# Patient Record
Sex: Female | Born: 1935
Health system: Southern US, Community
[De-identification: ages and names within clinical notes are randomized; demographics above are authoritative.]

## PROBLEM LIST (undated history)

## (undated) DIAGNOSIS — R51 Headache: Secondary | ICD-10-CM

## (undated) DIAGNOSIS — J45909 Unspecified asthma, uncomplicated: Secondary | ICD-10-CM

## (undated) DIAGNOSIS — E785 Hyperlipidemia, unspecified: Secondary | ICD-10-CM

## (undated) DIAGNOSIS — I209 Angina pectoris, unspecified: Secondary | ICD-10-CM

## (undated) DIAGNOSIS — I509 Heart failure, unspecified: Secondary | ICD-10-CM

## (undated) DIAGNOSIS — I2510 Atherosclerotic heart disease of native coronary artery without angina pectoris: Secondary | ICD-10-CM

## (undated) DIAGNOSIS — G4700 Insomnia, unspecified: Secondary | ICD-10-CM

## (undated) DIAGNOSIS — M810 Age-related osteoporosis without current pathological fracture: Secondary | ICD-10-CM

## (undated) DIAGNOSIS — J449 Chronic obstructive pulmonary disease, unspecified: Secondary | ICD-10-CM

## (undated) DIAGNOSIS — IMO0001 Reserved for inherently not codable concepts without codable children: Secondary | ICD-10-CM

## (undated) DIAGNOSIS — R112 Nausea with vomiting, unspecified: Secondary | ICD-10-CM

## (undated) DIAGNOSIS — Z9889 Other specified postprocedural states: Secondary | ICD-10-CM

## (undated) DIAGNOSIS — F329 Major depressive disorder, single episode, unspecified: Secondary | ICD-10-CM

## (undated) DIAGNOSIS — J439 Emphysema, unspecified: Secondary | ICD-10-CM

## (undated) DIAGNOSIS — J189 Pneumonia, unspecified organism: Secondary | ICD-10-CM

## (undated) DIAGNOSIS — F32A Depression, unspecified: Secondary | ICD-10-CM

## (undated) DIAGNOSIS — M1990 Unspecified osteoarthritis, unspecified site: Secondary | ICD-10-CM

## (undated) DIAGNOSIS — I10 Essential (primary) hypertension: Secondary | ICD-10-CM

## (undated) DIAGNOSIS — Z8719 Personal history of other diseases of the digestive system: Secondary | ICD-10-CM

## (undated) DIAGNOSIS — K219 Gastro-esophageal reflux disease without esophagitis: Secondary | ICD-10-CM

## (undated) DIAGNOSIS — R519 Headache, unspecified: Secondary | ICD-10-CM

## (undated) DIAGNOSIS — Z87442 Personal history of urinary calculi: Secondary | ICD-10-CM

## (undated) HISTORY — PX: CHOLECYSTECTOMY: SHX55

## (undated) HISTORY — PX: FOOT SURGERY: SHX648

## (undated) HISTORY — PX: ABDOMINAL HYSTERECTOMY: SHX81

## (undated) HISTORY — PX: APPENDECTOMY: SHX54

## (undated) HISTORY — DX: Insomnia, unspecified: G47.00

## (undated) HISTORY — PX: CATARACT EXTRACTION W/ INTRAOCULAR LENS  IMPLANT, BILATERAL: SHX1307

## (undated) HISTORY — DX: Emphysema, unspecified: J43.9

## (undated) HISTORY — DX: Hyperlipidemia, unspecified: E78.5

## (undated) HISTORY — PX: CARDIAC CATHETERIZATION: SHX172

## (undated) HISTORY — DX: Atherosclerotic heart disease of native coronary artery without angina pectoris: I25.10

## (undated) HISTORY — PX: REPLACEMENT TOTAL KNEE BILATERAL: SUR1225

## (undated) HISTORY — PX: BREAST SURGERY: SHX581

---

## 2000-05-13 ENCOUNTER — Ambulatory Visit (HOSPITAL_COMMUNITY): Admission: RE | Admit: 2000-05-13 | Discharge: 2000-05-13 | Payer: Self-pay | Admitting: Gastroenterology

## 2000-05-13 ENCOUNTER — Encounter: Payer: Self-pay | Admitting: Gastroenterology

## 2001-07-01 ENCOUNTER — Encounter: Payer: Self-pay | Admitting: Orthopedic Surgery

## 2001-07-07 ENCOUNTER — Encounter: Payer: Self-pay | Admitting: Orthopedic Surgery

## 2001-07-07 ENCOUNTER — Inpatient Hospital Stay (HOSPITAL_COMMUNITY): Admission: RE | Admit: 2001-07-07 | Discharge: 2001-07-12 | Payer: Self-pay | Admitting: Orthopedic Surgery

## 2001-07-12 ENCOUNTER — Inpatient Hospital Stay (HOSPITAL_COMMUNITY)
Admission: AD | Admit: 2001-07-12 | Discharge: 2001-07-18 | Payer: Self-pay | Admitting: Physical Medicine & Rehabilitation

## 2005-11-26 ENCOUNTER — Inpatient Hospital Stay (HOSPITAL_COMMUNITY): Admission: RE | Admit: 2005-11-26 | Discharge: 2005-11-29 | Payer: Self-pay | Admitting: Orthopedic Surgery

## 2011-04-01 DIAGNOSIS — J189 Pneumonia, unspecified organism: Secondary | ICD-10-CM | POA: Insufficient documentation

## 2011-04-01 HISTORY — DX: Pneumonia, unspecified organism: J18.9

## 2013-04-22 HISTORY — PX: COLONOSCOPY: SHX174

## 2014-11-14 DIAGNOSIS — I2510 Atherosclerotic heart disease of native coronary artery without angina pectoris: Secondary | ICD-10-CM | POA: Insufficient documentation

## 2014-11-14 DIAGNOSIS — F17200 Nicotine dependence, unspecified, uncomplicated: Secondary | ICD-10-CM

## 2014-11-14 DIAGNOSIS — E782 Mixed hyperlipidemia: Secondary | ICD-10-CM

## 2014-11-14 DIAGNOSIS — F320 Major depressive disorder, single episode, mild: Secondary | ICD-10-CM | POA: Insufficient documentation

## 2014-11-14 HISTORY — DX: Atherosclerotic heart disease of native coronary artery without angina pectoris: I25.10

## 2014-11-14 HISTORY — DX: Mixed hyperlipidemia: E78.2

## 2014-11-14 HISTORY — DX: Major depressive disorder, single episode, mild: F32.0

## 2014-12-15 ENCOUNTER — Emergency Department (HOSPITAL_COMMUNITY): Payer: Medicare Other

## 2014-12-15 ENCOUNTER — Encounter (HOSPITAL_COMMUNITY): Payer: Self-pay

## 2014-12-15 ENCOUNTER — Emergency Department (HOSPITAL_COMMUNITY)
Admission: EM | Admit: 2014-12-15 | Discharge: 2014-12-15 | Disposition: A | Discharge status: Home / Self Care | Payer: Medicare Other | Attending: Emergency Medicine | Admitting: Emergency Medicine

## 2014-12-15 DIAGNOSIS — J449 Chronic obstructive pulmonary disease, unspecified: Secondary | ICD-10-CM | POA: Insufficient documentation

## 2014-12-15 DIAGNOSIS — Z880 Allergy status to penicillin: Secondary | ICD-10-CM | POA: Diagnosis not present

## 2014-12-15 DIAGNOSIS — R531 Weakness: Secondary | ICD-10-CM | POA: Diagnosis not present

## 2014-12-15 DIAGNOSIS — Z7982 Long term (current) use of aspirin: Secondary | ICD-10-CM | POA: Diagnosis not present

## 2014-12-15 DIAGNOSIS — M1990 Unspecified osteoarthritis, unspecified site: Secondary | ICD-10-CM | POA: Diagnosis not present

## 2014-12-15 DIAGNOSIS — R300 Dysuria: Secondary | ICD-10-CM | POA: Diagnosis present

## 2014-12-15 DIAGNOSIS — R109 Unspecified abdominal pain: Secondary | ICD-10-CM | POA: Diagnosis not present

## 2014-12-15 DIAGNOSIS — I509 Heart failure, unspecified: Secondary | ICD-10-CM | POA: Diagnosis not present

## 2014-12-15 DIAGNOSIS — R935 Abnormal findings on diagnostic imaging of other abdominal regions, including retroperitoneum: Secondary | ICD-10-CM | POA: Diagnosis not present

## 2014-12-15 DIAGNOSIS — Z7951 Long term (current) use of inhaled steroids: Secondary | ICD-10-CM | POA: Diagnosis not present

## 2014-12-15 DIAGNOSIS — Z79899 Other long term (current) drug therapy: Secondary | ICD-10-CM | POA: Insufficient documentation

## 2014-12-15 DIAGNOSIS — J0100 Acute maxillary sinusitis, unspecified: Secondary | ICD-10-CM | POA: Insufficient documentation

## 2014-12-15 HISTORY — DX: Unspecified osteoarthritis, unspecified site: M19.90

## 2014-12-15 HISTORY — DX: Unspecified asthma, uncomplicated: J45.909

## 2014-12-15 HISTORY — DX: Chronic obstructive pulmonary disease, unspecified: J44.9

## 2014-12-15 HISTORY — DX: Heart failure, unspecified: I50.9

## 2014-12-15 HISTORY — DX: Age-related osteoporosis without current pathological fracture: M81.0

## 2014-12-15 LAB — COMPREHENSIVE METABOLIC PANEL
ALK PHOS: 83 U/L (ref 38–126)
ALT: 17 U/L (ref 14–54)
ANION GAP: 5 (ref 5–15)
AST: 24 U/L (ref 15–41)
Albumin: 4 g/dL (ref 3.5–5.0)
BUN: 21 mg/dL — ABNORMAL HIGH (ref 6–20)
CALCIUM: 8.8 mg/dL — AB (ref 8.9–10.3)
CHLORIDE: 103 mmol/L (ref 101–111)
CO2: 31 mmol/L (ref 22–32)
Creatinine, Ser: 0.91 mg/dL (ref 0.44–1.00)
GFR, EST NON AFRICAN AMERICAN: 58 mL/min — AB (ref >60)
Glucose, Bld: 104 mg/dL — ABNORMAL HIGH (ref 65–99)
Potassium: 4.4 mmol/L (ref 3.5–5.1)
SODIUM: 139 mmol/L (ref 135–145)
Total Bilirubin: 0.4 mg/dL (ref 0.3–1.2)
Total Protein: 7.2 g/dL (ref 6.5–8.1)

## 2014-12-15 LAB — LIPASE, BLOOD: LIPASE: 38 U/L (ref 22–51)

## 2014-12-15 LAB — CBC WITH DIFFERENTIAL/PLATELET
Basophils Absolute: 0.1 10*3/uL (ref 0.0–0.1)
Basophils Relative: 1 %
EOS ABS: 0.3 10*3/uL (ref 0.0–0.7)
EOS PCT: 3 %
HCT: 41.2 % (ref 36.0–46.0)
Hemoglobin: 13.4 g/dL (ref 12.0–15.0)
LYMPHS ABS: 3.1 10*3/uL (ref 0.7–4.0)
Lymphocytes Relative: 36 %
MCH: 30.5 pg (ref 26.0–34.0)
MCHC: 32.5 g/dL (ref 30.0–36.0)
MCV: 93.6 fL (ref 78.0–100.0)
MONOS PCT: 10 %
Monocytes Absolute: 0.9 10*3/uL (ref 0.1–1.0)
Neutro Abs: 4.4 10*3/uL (ref 1.7–7.7)
Neutrophils Relative %: 50 %
PLATELETS: 194 10*3/uL (ref 150–400)
RBC: 4.4 MIL/uL (ref 3.87–5.11)
RDW: 13.3 % (ref 11.5–15.5)
WBC: 8.7 10*3/uL (ref 4.0–10.5)

## 2014-12-15 LAB — URINALYSIS, ROUTINE W REFLEX MICROSCOPIC
Bilirubin Urine: NEGATIVE
Glucose, UA: NEGATIVE mg/dL
Hgb urine dipstick: NEGATIVE
Ketones, ur: NEGATIVE mg/dL
Leukocytes, UA: NEGATIVE
Nitrite: NEGATIVE
Protein, ur: NEGATIVE mg/dL
Specific Gravity, Urine: 1.013 (ref 1.005–1.030)
Urobilinogen, UA: 0.2 mg/dL (ref 0.0–1.0)
pH: 6 (ref 5.0–8.0)

## 2014-12-15 LAB — TROPONIN I: Troponin I: <0.03 ng/mL (ref <0.031)

## 2014-12-15 LAB — I-STAT CG4 LACTIC ACID, ED
LACTIC ACID, VENOUS: 1.15 mmol/L (ref 0.5–2.0)
Lactic Acid, Venous: 1.39 mmol/L (ref 0.5–2.0)

## 2014-12-15 LAB — TSH: TSH: 1.182 u[IU]/mL (ref 0.350–4.500)

## 2014-12-15 LAB — MAGNESIUM: MAGNESIUM: 2 mg/dL (ref 1.7–2.4)

## 2014-12-15 MED ORDER — IOHEXOL 300 MG/ML  SOLN
100.0000 mL | Freq: Once | INTRAMUSCULAR | Status: AC | PRN
  Administered 2014-12-15: 100 mL via INTRAVENOUS

## 2014-12-15 MED ORDER — IOHEXOL 300 MG/ML  SOLN
50.0000 mL | Freq: Once | INTRAMUSCULAR | Status: AC | PRN
  Administered 2014-12-15: 50 mL via ORAL

## 2014-12-15 MED ORDER — SODIUM CHLORIDE 0.9 % IV SOLN
Freq: Once | INTRAVENOUS | Status: AC
  Administered 2014-12-15: 16:00:00 via INTRAVENOUS

## 2014-12-15 MED ORDER — AZITHROMYCIN 250 MG PO TABS: 250.0000 mg | ORAL_TABLET | Freq: Every day | ORAL | Status: DC

## 2014-12-15 MED ORDER — ONDANSETRON HCL 4 MG/2ML IJ SOLN
4.0000 mg | Freq: Once | INTRAMUSCULAR | Status: AC
  Administered 2014-12-15: 4 mg via INTRAVENOUS
  Filled 2014-12-15: qty 2

## 2014-12-15 MED ORDER — AZITHROMYCIN 250 MG PO TABS
500.0000 mg | ORAL_TABLET | Freq: Once | ORAL | Status: AC
  Administered 2014-12-15: 500 mg via ORAL
  Filled 2014-12-15: qty 2

## 2014-12-15 NOTE — ED Notes (Addendum)
Istat hcg test done in error, notified edp Miller.   Filled out edit sheet to credit patient's acct

## 2014-12-15 NOTE — ED Notes (Signed)
Per EMS- Patient lives at home. Patient c/o pain all over, but mainly right flank pain. Patient also c/o dysuria and urinary frequency. Patient wears O2 at night.

## 2014-12-15 NOTE — Discharge Instructions (Signed)

## 2014-12-15 NOTE — ED Notes (Signed)
Bed: WA07 Expected date:  Expected time:  Means of arrival:  Comments: ems 

## 2014-12-15 NOTE — ED Provider Notes (Signed)
CSN: [id number]     Arrival date & time 12/15/14  1436 History   First MD Initiated Contact with Patient 12/15/14 1456     Chief Complaint  Patient presents with  . Flank Pain  . Dysuria     (Consider location/radiation/quality/duration/timing/severity/associated sxs/prior Treatment) HPI Comments: 81 y/o female has had approximately 1 week of generalized weakness including feeling near syncopal starting last Friday - she reports staying in bed Sat and SUn b/c of weakness (generalized) and then this last week has done more with walking - she has been eating and drinking but states that she has no appetitis and is still weak.  She has dysuria, and hurts in her chest and abdomen from teh pelvis where there is pressure to the chest which burns.  She has ongoing light headedness.  Occasional cough, no fevers, no meds pta - she has beentaking her "fluid pills" and urinating frequently.  Patient is a 81 y.o. female presenting with flank pain and dysuria. The history is provided by the patient and a relative.  Flank Pain  Dysuria Associated symptoms: flank pain     Past Medical History  Diagnosis Date  . COPD (chronic obstructive pulmonary disease)   . CHF (congestive heart failure)   . Arthritis   . Asthma   . Osteoporosis    Past Surgical History  Procedure Laterality Date  . Replacement total knee bilateral    . Foot surgery    . Appendectomy    . Abdominal hysterectomy    . Cholecystectomy     Family History  Problem Relation Age of Onset  . Family history unknown: Yes   Social History  Substance Use Topics  . Smoking status: Never Smoker   . Smokeless tobacco: Never Used  . Alcohol Use: No   OB History    No data available     Review of Systems  Genitourinary: Positive for dysuria and flank pain.  All other systems reviewed and are negative.     Allergies  Codeine; Penicillins; and Tape  Home Medications   Prior to Admission medications   Medication Sig  Start Date End Date Taking? Authorizing Provider  albuterol (PROVENTIL HFA;VENTOLIN HFA) 108 (90 BASE) MCG/ACT inhaler Inhale 2 puffs into the lungs every 6 (six) hours as needed.   Yes Historical Provider, MD  aspirin 81 MG tablet Take 81 mg by mouth daily.   Yes Historical Provider, MD  budesonide-formoterol (SYMBICORT) 160-4.5 MCG/ACT inhaler Inhale 2 puffs into the lungs 2 (two) times daily.   Yes Historical Provider, MD  citalopram (CELEXA) 20 MG tablet Take 30 mg by mouth daily. 11/12/14  Yes Historical Provider, MD  clonazePAM (KLONOPIN) 1 MG tablet Take 1 mg by mouth daily. 11/20/14  Yes Historical Provider, MD  furosemide (LASIX) 20 MG tablet Take 20 mg by mouth daily. 11/20/14  Yes Historical Provider, MD  metoprolol succinate (TOPROL-XL) 25 MG 24 hr tablet Take 25 mg by mouth daily. 11/20/14  Yes Historical Provider, MD  pantoprazole (PROTONIX) 40 MG tablet Take 40 mg by mouth 2 (two) times daily. 11/20/14  Yes Historical Provider, MD  potassium chloride (MICRO-K) 10 MEQ CR capsule Take 10 mEq by mouth daily. 11/20/14  Yes Historical Provider, MD  Vitamin D, Ergocalciferol, (DRISDOL) 50000 UNITS CAPS capsule Take 50,000 Units by mouth every 7 (seven) days.   Yes Historical Provider, MD  azithromycin (ZITHROMAX Z-PAK) 250 MG tablet Take 1 tablet (250 mg total) by mouth daily. 500mg  PO day 1, then 250mg   PO days 205 12/15/14   Noemi Chapel, MD   BP 145/60 mmHg  Pulse 50  Temp(Src) 97.9 F (36.6 C) (Oral)  Resp 15  SpO2 95% Physical Exam  Constitutional: She appears well-developed and well-nourished. No distress.  HENT:  Head: Normocephalic and atraumatic.  Mouth/Throat: No oropharyngeal exudate.  Mucous membranes are slightly dehydrated  Eyes: Conjunctivae and EOM are normal. Pupils are equal, round, and reactive to light. Right eye exhibits no discharge. Left eye exhibits no discharge. No scleral icterus.  Neck: Normal range of motion. Neck supple. No JVD present. No thyromegaly present.   Cardiovascular: Normal rate, regular rhythm, normal heart sounds and intact distal pulses.  Exam reveals no gallop and no friction rub.   No murmur heard. Pulmonary/Chest: Effort normal and breath sounds normal. No respiratory distress. She has no wheezes. She has no rales.  Abdominal: Soft. Bowel sounds are normal. She exhibits no distension and no mass. There is tenderness (diffuse abdominal tenderness specifically lower abdomen, some in the epigastrium, obese, no tympanitic sounds to percussion).  Musculoskeletal: Normal range of motion. She exhibits edema (minimal bilateral edema, left leg slightly more swollen than the right, this is chronic). She exhibits no tenderness.  Lymphadenopathy:    She has no cervical adenopathy.  Neurological: She is alert. Coordination normal.  Able to sit up in the bed on her own ability, follows commands without difficulty, no focal weakness, normal speech, cranial nerves III through XII are normal  Skin: Skin is warm and dry. No rash noted. No erythema.  No rashes or skin breakdown  Psychiatric: She has a normal mood and affect. Her behavior is normal.  Nursing note and vitals reviewed.   ED Course  Procedures (including critical care time) Labs Review Labs Reviewed  COMPREHENSIVE METABOLIC PANEL - Abnormal; Notable for the following:    Glucose, Bld 104 (*)    BUN 21 (*)    Calcium 8.8 (*)    GFR calc non Af Amer 58 (*)    All other components within normal limits  I-STAT BETA HCG BLOOD, ED ([facility], [facility], [facility] ONLY) - Abnormal; Notable for the following:    I-stat hCG, quantitative 13.7 (*)    All other components within normal limits  URINE CULTURE  URINALYSIS, ROUTINE W REFLEX MICROSCOPIC (NOT AT [facility])  CBC WITH DIFFERENTIAL/PLATELET  LIPASE, BLOOD  MAGNESIUM  TROPONIN I  TSH  I-STAT CG4 LACTIC ACID, ED  I-STAT CG4 LACTIC ACID, ED    Imaging Review Ct Abdomen Pelvis W Contrast  12/15/2014   CLINICAL DATA:  Pain all over, mainly right flank  pain. Dysuria and urinary frequency. Wears oxygen at night. History of CHF and COPD.  EXAM: CT ABDOMEN AND PELVIS WITH CONTRAST  TECHNIQUE: Multidetector CT imaging of the abdomen and pelvis was performed using the standard protocol following bolus administration of intravenous contrast.  CONTRAST:  72mL OMNIPAQUE IOHEXOL 300 MG/ML SOLN, 187mL OMNIPAQUE IOHEXOL 300 MG/ML SOLN  COMPARISON:  02/03/2011  FINDINGS: Lower chest: No pulmonary nodules, pleural effusions, or infiltrates. Coronary artery calcifications are noted.  Upper abdomen: Small calcifications identified within the liver. No solid noncalcified mass is identified within the liver, spleen, pancreas, or right adrenal gland. The kidneys show symmetric bilateral excretion. There are bilateral small renal cysts, measuring less than 1 cm in diameter. A left adrenal nodule is indeterminate for washout. However the appearance is stable compared with previous exam in findings favor a benign adenoma. Gallbladder is absent.  Gastrointestinal tract: The appendix is  distended, measuring 18 mm consistent. There is very little if any inflammatory change surrounding the appendix. The density of the distended appendix is low-attenuation, raising the question of mucocele. There is no mural calcification within the appendix.  Small hiatal hernia identified. Otherwise the stomach has a normal appearance. Small bowel loops are normal in appearance. Colonic loops contain numerous diverticula but there is no evidence for acute diverticulitis.  Pelvis: Uterus is surgically absent. No adnexal mass. No free pelvic fluid.  Retroperitoneum: There is atherosclerotic calcification of the abdominal aorta. No retroperitoneal or mesenteric adenopathy.  Abdominal wall: Right inguinal fat containing hernia.  Osseous structures: Moderate degenerative changes in the lumbar spine. No suspicious lytic or blastic lesions are identified.  IMPRESSION: 1. Distended, low-attenuation appendix.  Findings raise a question of mucocele, but there is overlap with the CT appearance for acute appendicitis. 2. No associated abscess or free intraperitoneal air. 3. No urinary tract obstruction. 4. Colonic diverticulosis. 5. Coronary artery disease. 6. Small hepatic granulomata. 7. Benign left adrenal adenoma. 8. Small hiatal hernia. 9. Abdominal aortic atherosclerosis. 10. Small right inguinal fat containing hernia.  The salient findings were discussed with BRIAN MILLER on 12/15/2014 at 5:44 pm.   Electronically Signed   By: Nolon Nations M.D.   On: 12/15/2014 17:44   I have personally reviewed and evaluated these images and lab results as part of my medical decision-making.   EKG Interpretation   Date/Time:  Friday December 15 2014 15:52:19 EDT Ventricular Rate:  53 PR Interval:  205 QRS Duration: 100 QT Interval:  456 QTC Calculation: 428 R Axis:   22 Text Interpretation:  Sinus rhythm Abnormal R-wave progression, early  transition Since last tracing rate slower Confirmed by MILLER  MD, BRIAN  [phone]) on 12/15/2014 4:52:23 PM     labs unremarkable, thyroid test ordered, this can be followed up outpatient. Vital signs remain normal, CT scan shows possible appendiceal mucocele however the patient no longer has an appendix. Discussed with general surgery, she will need to make an appointment with them on Monday. Dr. Clarise Cruz is in agreement and will see the patient in the office.  Thyroid studies were ordered and can be followed up on Monday at the patient's doctor's office  She now states that she is complaining of maxillary facial pain, upper gum pain, earaches, there is no findings of acute infections however this may reflect sinusitis, Zithromax ordered for outpatient.  MDM   Final diagnoses:  Weakness  Abnormal CT of the abdomen  Acute maxillary sinusitis, recurrence not specified    Vital signs are unremarkable, exam is rather unremarkable as well except for the abdominal  tenderness. Pursue sources of occult generalized weakness. We'll evaluate her heart, abdominal organs, infectious etiologies sought      Noemi Chapel, MD 12/15/14 [phone]

## 2014-12-17 LAB — URINE CULTURE

## 2014-12-18 LAB — I-STAT BETA HCG BLOOD, ED (MC, WL, AP ONLY): I-stat hCG, quantitative: 13.7 m[IU]/mL — ABNORMAL HIGH (ref <5)

## 2015-02-09 ENCOUNTER — Other Ambulatory Visit: Payer: Self-pay | Admitting: General Surgery

## 2015-02-09 MED ORDER — GENTAMICIN SULFATE 40 MG/ML IJ SOLN: 5.0000 mg/kg | INTRAVENOUS | Status: AC

## 2015-02-09 MED ORDER — DEXTROSE 5 % IV SOLN: 900.0000 mg | INTRAVENOUS | Status: AC

## 2015-02-12 ENCOUNTER — Encounter (HOSPITAL_COMMUNITY): Payer: Self-pay

## 2015-02-12 ENCOUNTER — Encounter (HOSPITAL_COMMUNITY)
Admission: RE | Admit: 2015-02-12 | Discharge: 2015-02-12 | Disposition: A | Discharge status: Home / Self Care | Payer: Medicare Other | Source: Ambulatory Visit | Attending: General Surgery | Admitting: General Surgery

## 2015-02-12 DIAGNOSIS — J45909 Unspecified asthma, uncomplicated: Secondary | ICD-10-CM | POA: Diagnosis not present

## 2015-02-12 DIAGNOSIS — Z96653 Presence of artificial knee joint, bilateral: Secondary | ICD-10-CM | POA: Diagnosis not present

## 2015-02-12 DIAGNOSIS — Z79899 Other long term (current) drug therapy: Secondary | ICD-10-CM | POA: Diagnosis not present

## 2015-02-12 DIAGNOSIS — I5022 Chronic systolic (congestive) heart failure: Secondary | ICD-10-CM | POA: Insufficient documentation

## 2015-02-12 DIAGNOSIS — K388 Other specified diseases of appendix: Secondary | ICD-10-CM | POA: Insufficient documentation

## 2015-02-12 DIAGNOSIS — I110 Hypertensive heart disease with heart failure: Secondary | ICD-10-CM | POA: Diagnosis not present

## 2015-02-12 DIAGNOSIS — Z01818 Encounter for other preprocedural examination: Secondary | ICD-10-CM | POA: Insufficient documentation

## 2015-02-12 DIAGNOSIS — J449 Chronic obstructive pulmonary disease, unspecified: Secondary | ICD-10-CM | POA: Diagnosis not present

## 2015-02-12 DIAGNOSIS — Z01812 Encounter for preprocedural laboratory examination: Secondary | ICD-10-CM | POA: Insufficient documentation

## 2015-02-12 DIAGNOSIS — K219 Gastro-esophageal reflux disease without esophagitis: Secondary | ICD-10-CM | POA: Insufficient documentation

## 2015-02-12 DIAGNOSIS — E785 Hyperlipidemia, unspecified: Secondary | ICD-10-CM | POA: Diagnosis not present

## 2015-02-12 DIAGNOSIS — F329 Major depressive disorder, single episode, unspecified: Secondary | ICD-10-CM | POA: Insufficient documentation

## 2015-02-12 DIAGNOSIS — Z7982 Long term (current) use of aspirin: Secondary | ICD-10-CM | POA: Insufficient documentation

## 2015-02-12 HISTORY — DX: Headache: R51

## 2015-02-12 HISTORY — DX: Other specified postprocedural states: Z98.890

## 2015-02-12 HISTORY — DX: Other specified postprocedural states: R11.2

## 2015-02-12 HISTORY — DX: Essential (primary) hypertension: I10

## 2015-02-12 HISTORY — DX: Headache, unspecified: R51.9

## 2015-02-12 HISTORY — DX: Pneumonia, unspecified organism: J18.9

## 2015-02-12 HISTORY — DX: Angina pectoris, unspecified: I20.9

## 2015-02-12 HISTORY — DX: Personal history of other diseases of the digestive system: Z87.19

## 2015-02-12 HISTORY — DX: Reserved for inherently not codable concepts without codable children: IMO0001

## 2015-02-12 HISTORY — DX: Major depressive disorder, single episode, unspecified: F32.9

## 2015-02-12 HISTORY — DX: Depression, unspecified: F32.A

## 2015-02-12 HISTORY — DX: Gastro-esophageal reflux disease without esophagitis: K21.9

## 2015-02-12 LAB — PROTIME-INR
INR: 1.09 (ref 0.00–1.49)
Prothrombin Time: 14.3 seconds (ref 11.6–15.2)

## 2015-02-12 LAB — URINALYSIS, ROUTINE W REFLEX MICROSCOPIC
GLUCOSE, UA: NEGATIVE mg/dL
HGB URINE DIPSTICK: NEGATIVE
Ketones, ur: NEGATIVE mg/dL
LEUKOCYTES UA: NEGATIVE
Nitrite: NEGATIVE
Protein, ur: NEGATIVE mg/dL
SPECIFIC GRAVITY, URINE: 1.033 — AB (ref 1.005–1.030)
UROBILINOGEN UA: 1 mg/dL (ref 0.0–1.0)
pH: 5.5 (ref 5.0–8.0)

## 2015-02-12 LAB — CBC WITH DIFFERENTIAL/PLATELET
Basophils Absolute: 0.1 10*3/uL (ref 0.0–0.1)
Basophils Relative: 1 %
EOS PCT: 4 %
Eosinophils Absolute: 0.2 10*3/uL (ref 0.0–0.7)
HEMATOCRIT: 41.3 % (ref 36.0–46.0)
Hemoglobin: 13.1 g/dL (ref 12.0–15.0)
LYMPHS ABS: 3 10*3/uL (ref 0.7–4.0)
LYMPHS PCT: 44 %
MCH: 30.1 pg (ref 26.0–34.0)
MCHC: 31.7 g/dL (ref 30.0–36.0)
MCV: 94.9 fL (ref 78.0–100.0)
MONO ABS: 0.6 10*3/uL (ref 0.1–1.0)
Monocytes Relative: 8 %
Neutro Abs: 2.9 10*3/uL (ref 1.7–7.7)
Neutrophils Relative %: 43 %
PLATELETS: 203 10*3/uL (ref 150–400)
RBC: 4.35 MIL/uL (ref 3.87–5.11)
RDW: 13.5 % (ref 11.5–15.5)
WBC: 6.8 10*3/uL (ref 4.0–10.5)

## 2015-02-12 LAB — BASIC METABOLIC PANEL
Anion gap: 8 (ref 5–15)
BUN: 22 mg/dL — AB (ref 6–20)
CHLORIDE: 102 mmol/L (ref 101–111)
CO2: 30 mmol/L (ref 22–32)
Calcium: 9.5 mg/dL (ref 8.9–10.3)
Creatinine, Ser: 0.79 mg/dL (ref 0.44–1.00)
GFR calc Af Amer: >60 mL/min (ref >60)
GFR calc non Af Amer: >60 mL/min (ref >60)
GLUCOSE: 92 mg/dL (ref 65–99)
POTASSIUM: 4.1 mmol/L (ref 3.5–5.1)
Sodium: 140 mmol/L (ref 135–145)

## 2015-02-12 NOTE — Progress Notes (Addendum)
PCP: Dr. Bea Gra[facility] in [place] [phone]  Pulmonary: Dr. Adriana Reams [facility], will request notes,any studies  Cardiologist: Dr. [name] [facility] physicians([facility]) Patricia Pesa- will request:note,stress test, ekg,echo, clearance note  Request: heart cath:[facility], echo [facility].

## 2015-02-12 NOTE — Pre-Procedure Instructions (Signed)
    Danielle [name]  02/12/2015      [facility] [address] [place], [place] - [address] [address] [address] [place] [address] Phone: [phone] Fax: [phone]    Your procedure is scheduled on Monday, Nov.21  Report to [facility] Admitting at 9:00 A.M.  Call this number if you have problems the morning of surgery:  [phone]              Any questions prior to surgery call [phone] Monday-Friday 8AM-4PM   Remember:  Do not eat food or drink liquids after midnight on Sunday, Nov. 20                   Take these medicines the morning of surgery with A SIP OF WATER : albuterol if needed (bring to hospital), symbicort-bring to hospital, citalopram (celexa), clonazepam (klonopin), metoprolol (toprol-xl),pantoprazole (protonix)              Stop aspirin,vitamins, herbal medicines,and don't take nsaids medicines: advil, motrin, ibuprofen,aleve  5 days  prior to surgery per Dr. Barry Dienes   Do not wear jewelry, make-up or nail polish.  Do not wear lotions, powders, or perfumes.  You may not wear deodorant.  Do not shave 48 hours prior to surgery.  Men may shave face and neck.  Do not bring valuables to the hospital.  [facility] is not responsible for any belongings or valuables.  Contacts, dentures or bridgework may not be worn into surgery.  Leave your suitcase in the car.  After surgery it may be brought to your room.  For patients admitted to the hospital, discharge time will be determined by your treatment team.  Patients discharged the day of surgery will not be allowed to drive home.   Name and phone number of your driver:   Special instructions: review preparing for surgery  Please read over the following fact sheets that you were given. Pain Booklet, Coughing and Deep Breathing and Surgical Site Infection Prevention

## 2015-02-12 NOTE — Progress Notes (Addendum)
Anesthesia Chart Review: Patient is a 81 year old female scheduled for appendectomy, laparoscopic possible ileocolectomy on 02/19/15 by Dr. Barry Dienes. DX: Mucocele of the appendix.  Other history includes chronic diastolic CHF, smoking, depression, HLD, COPD with nocturnal home O2, HTN, post-operative N/V, arthritis, asthma, exertional dyspnea, hiatal hernia, GERD, headaches, osteoporosis, hysterectomy, bilateral BKA, cholecystectomy. She was seen by cardiology ~ 11/2014 for chest pain and had a stress test. BMI is consistent with morbid obesity.   PCP is Dr. Gilford Rile. She sees Karie Chimera, PA-C with [employer], records requested.  Cardiologist is Dr. [name]. 01/24/15 notes (Care Everywhere) indicates that "she is ok to proceed with Surgery."  Meds include albuterol, ASA 81mg , Symbicort, Klonipin, Lasix, Toprol XL, Protonix, Micro-K, Crestor, Drisdol.  12/15/14 EKG: SR, abnormal r wave progression, early transition.   01/16/15 Dobutamine Cardiolite [facility]): PENDING   Reported a prior cardiac cath at [facility] ~ 2014. Records PENDING.  Preoperative labs noted. A1C is pending.   Will follow-up once [facility] and [facility] records received. Also awaiting pulmonology records.   George Hugh [facility]/Anesthesiology Phone [phone] 02/12/2015 5:05 PM  Addendum: A1C 6.7.   01/16/15 Nuclear stress test [facility]): Conclusions:  1. Functional capacity is not assessed. 2. Normal resting LV size/function with LVEF of 71%. 3. No clinical or scintigraphic ischemia at target HR on dobutamine.  4. Negative Dobutamine perfusion stress test.  12/14/12 [facility] ([facility]; Dr. Lewayne Bunting): Mild non-obstructive disease, with ectatic RCA with maximal stenosis of 40%. Other vessels are normal. Normal Lv systolic function, LVEF [id number]. Recommend work-up for non-cardiac causes of symptoms. Medical therapy for CAD risk factor reduction.    No echo within five years from [facility] or [facility]. Only copy of clearance note received from [facility]. Pulmonology records are still pending. I've asked nursing staff to have me review once available.   George Hugh [facility] Short Stay Center/Anesthesiology Phone [phone] 02/14/2015 1:19 PM

## 2015-02-13 LAB — HEMOGLOBIN A1C
HEMOGLOBIN A1C: 6.7 % — AB (ref 4.8–5.6)
MEAN PLASMA GLUCOSE: 146 mg/dL

## 2015-02-14 ENCOUNTER — Encounter (HOSPITAL_COMMUNITY): Payer: Self-pay

## 2015-02-15 NOTE — H&P (Signed)
[facility] Location: [facility] Patient #: [id number] DOB: [birth year] Married / Language: English / Race: White Female   History of Present Illness The patient is a 81 year old female who presents with an abdominal mass. Patient is a 81 year old female who is referred by the emergency department for a mucocele of the appendix found on a CT scan. The patient went to the emergency department because of abdominal pain. She states she has been having some pain in the right abdomen for around a year or so, but this was much worse. She denied any significant nausea or vomiting. She stated that that migrated into radiate to the middle of her abdomen and up to her flank. She does not have any fevers or chills. Her workup in the emergency department did not reveal any leukocytosis. She was concerned because she has had an appendectomy at age 43 and was unclear how she could have an appendiceal mass. The patient has significant orthopedic issues with back pain and status post bilateral knee replacements. Of note, she had a colonoscopy last year by Dr. Lyndel Safe in [place]. She has a brother had colon cancer and a sister with pancreatic cancer. She denies any breast cancer or ovarian cancer in her family. She does have a cardiologist Dr. Orlinda Blalock and [place]. She states that she has had possible negative stress tests and cardiology workups. She does use oxygen at night. CT abd/pelvis 12/15/2014 IMPRESSION: 1. Distended, low-attenuation appendix. Findings raise a question of mucocele, but there is overlap with the CT appearance for acute appendicitis. 2. No associated abscess or free intraperitoneal air. 3. No urinary tract obstruction. 4. Colonic diverticulosis. 5. Coronary artery disease. 6. Small hepatic granulomata. 7. Benign left adrenal adenoma. 8. Small hiatal hernia. 9. Abdominal aortic atherosclerosis. 10. Small right inguinal fat containing hernia.   Other  Problems Anxiety Disorder Arthritis Asthma Back Pain Bladder Problems Chest pain Cholelithiasis Chronic Obstructive Lung Disease Congestive Heart Failure Depression Gastroesophageal Reflux Disease Hemorrhoids High blood pressure Home Oxygen Use Hypercholesterolemia Inguinal Hernia Kidney Stone Lump In Breast  Past Surgical History Appendectomy Breast Biopsy Left. Cataract Surgery Bilateral. Colon Polyp Removal - Colonoscopy Foot Surgery Left. Gallbladder Surgery - Open Hemorrhoidectomy Hysterectomy (not due to cancer) - Partial Knee Surgery Bilateral. Oral Surgery  Diagnostic Studies History  Colonoscopy 1-5 years ago Mammogram within last year  Allergies  No Known Drug Allergies  Medication History Albuterol Sulfate HFA (108MCG/ACT Aerosol Soln, Inhalation) Active. Aspirin (81MG  Tablet, Oral) Active. Citalopram Hydrobromide (20MG  Tablet, Oral) Active. Crestor (40MG  Tablet, Oral) Active. ClonazePAM (1MG  Tablet Disperse, Oral) Active. Furosemide (20MG  Tablet, Oral) Active. Metoprolol Tartrate (25MG  Tablet, Oral) Active. Pantoprazole Sodium (40MG  Tablet DR, Oral) Active. Potassium Chloride (10MEQ Capsule ER, Oral) Active. Symbicort (160-4.5MCG/ACT Aerosol, Inhalation) Active. Vitamin D (1000UNIT Tablet, Oral) Active. Medications Reconciled  Social History Caffeine use Carbonated beverages, Coffee, Tea. No alcohol use No drug use Tobacco use Current some day smoker.  Family History  Arthritis Brother, Father, Sister, Son. Cerebrovascular Accident Father. Colon Cancer Brother. Depression Sister. Diabetes Mellitus Brother, Son. Heart Disease Mother, Son. Malignant Neoplasm Of Pancreas Sister. Respiratory Condition Father, Sister. Thyroid problems Sister.  Pregnancy / Birth History Age at menarche 89 years. Gravida 2 Maternal age 73-20 Para 2    Review of Systems General Present-  Appetite Loss, Chills, Fatigue, Weight Gain and Weight Loss. Not Present- Fever and Night Sweats. Skin Present- Dryness. Not Present- Change in Wart/Mole, Hives, Jaundice, New Lesions, Non-Healing Wounds, Rash and Ulcer. HEENT Present-  Earache, Hoarseness, Seasonal Allergies, Sinus Pain and Wears glasses/contact lenses. Not Present- Hearing Loss, Nose Bleed, Oral Ulcers, Ringing in the Ears, Sore Throat, Visual Disturbances and Yellow Eyes. Respiratory Present- Chronic Cough, Difficulty Breathing, Snoring and Wheezing. Not Present- Bloody sputum. Breast Present- Breast Pain. Not Present- Breast Mass, Nipple Discharge and Skin Changes. Cardiovascular Present- Chest Pain, Leg Cramps, Shortness of Breath and Swelling of Extremities. Not Present- Difficulty Breathing Lying Down, Palpitations and Rapid Heart Rate. Gastrointestinal Present- Abdominal Pain, Bloating, Excessive gas, Hemorrhoids, Indigestion and Nausea. Not Present- Bloody Stool, Change in Bowel Habits, Chronic diarrhea, Constipation, Difficulty Swallowing, Gets full quickly at meals, Rectal Pain and Vomiting. Female Genitourinary Present- Frequency, Nocturia and Pelvic Pain. Not Present- Painful Urination and Urgency. Musculoskeletal Present- Back Pain, Joint Pain, Muscle Pain, Muscle Weakness and Swelling of Extremities. Not Present- Joint Stiffness. Neurological Present- Decreased Memory, Headaches, Numbness, Tingling, Tremor, Trouble walking and Weakness. Not Present- Fainting and Seizures. Psychiatric Present- Anxiety, Depression and Fearful. Not Present- Bipolar, Change in Sleep Pattern and Frequent crying. Endocrine Present- Heat Intolerance. Not Present- Cold Intolerance, Excessive Hunger, Hair Changes, Hot flashes and New Diabetes. Hematology Present- Easy Bruising. Not Present- Excessive bleeding, Gland problems, HIV and Persistent Infections.  Vitals  Weight: 250 lb Height: 63in Body Surface Area: 2.25 m Body Mass Index:  44.29 kg/m  Temp.: 98.75F(Temporal)  Pulse: 60 (Regular)  BP: 130/72 (Sitting, Left Arm, Standard)     Physical Exam  General Mental Status-Alert. General Appearance-Consistent with stated age. Hydration-Well hydrated. Voice-Normal. Note: Pt uses cane to get around.   Head and Neck Head-normocephalic, atraumatic with no lesions or palpable masses. Trachea-midline. Thyroid Gland Characteristics - normal size and consistency.  Eye Eyeball - Bilateral-Extraocular movements intact. Sclera/Conjunctiva - Bilateral-No scleral icterus.  Chest and Lung Exam Chest and lung exam reveals -quiet, even and easy respiratory effort with no use of accessory muscles and on auscultation, normal breath sounds, no adventitious sounds and normal vocal resonance. Inspection Chest Wall - Normal. Back - normal.  Cardiovascular Cardiovascular examination reveals -normal heart sounds, regular rate and rhythm with no murmurs and normal pedal pulses bilaterally.  Abdomen Inspection Inspection of the abdomen reveals - No Hernias. Palpation/Percussion Palpation and Percussion of the abdomen reveal - Soft, Non Tender, No Rebound tenderness, No Rigidity (guarding) and No hepatosplenomegaly. Auscultation Auscultation of the abdomen reveals - Bowel sounds normal. Note: striae on abdomen. Far lateral right abdominal scar from appendicitis. Small midline scar from BTL. RUQ scar from open chole   Neurologic Neurologic evaluation reveals -alert and oriented x 3 with no impairment of recent or remote memory. Mental Status-Normal.  Musculoskeletal Global Assessment -Note: no gross deformities.  Normal Exam - Left-Upper Extremity Strength Normal and Lower Extremity Strength Normal. Normal Exam - Right-Upper Extremity Strength Normal and Lower Extremity Strength Normal.  Lymphatic Head & Neck  General Head & Neck Lymphatics: Bilateral - Description -  Normal. Axillary  General Axillary Region: Bilateral - Description - Normal. Tenderness - Non Tender. Femoral & Inguinal  Generalized Femoral & Inguinal Lymphatics: Bilateral - Description - No Generalized lymphadenopathy.    Assessment & Plan MUCOCELE, APPENDIX (K38.8) Impression: This is likely a mucocele at the stump of the appendix. However, she may certainly have this from the colon or the small bowel. I discussed doing a appendectomy of the appendiceal stump versus partial cecectomy. I also reviewed that sometimes we have to completely remove that portion of the colon and distal small intestine and reconnect things. I reviewed the risks of  all of these procedures. Did discuss that she would be at slightly increased risk based on her lungs.  I discussed the risk of bleeding, infection, damage to adjacent structures, possible finding of cancer, possible need for additional treatment, possible recurrent cancer, heart or lung complications, hernia, and death.  The patient understands and wishes to proceed.  45 min spent in examination, evaluation, counseling, and coordination of care. >50% spent in counseling. Current Plans You are being scheduled for surgery - Our schedulers will call you.  You should hear from our office's scheduling department within 5 working days about the location, date, and time of surgery. We try to make accommodations for patient's preferences in scheduling surgery, but sometimes the OR schedule or the surgeon's schedule prevents [place] from making those accommodations.  If you have not heard from our office [phone] in 5 working days, call the office and ask for your surgeon's nurse.  If you have other questions about your diagnosis, plan, or surgery, call the office and ask for your surgeon's nurse.  Pt Education - [facility] Free Text Education/Instructions: discussed with patient and provided information. Pt Education - AT Colonoscopy Bowel Prep: discussed with  patient and provided information. Started Neomycin Sulfate 500MG , 2 (two) Tablet SEE NOTE, #6, 01/01/2015, No Refill. Local Order: TAKE TWO TABLETS AT 2 PM, 3 PM, AND 10 PM THE DAY PRIOR TO SURGERY Started Flagyl 500MG , 2 (two) Tablet SEE NOTE, #6, 01/01/2015, No Refill. Local Order: Take at 2pm, 3pm, and 10pm the day prior to your colon operation

## 2015-02-15 NOTE — Progress Notes (Signed)
Called [phone] request was sent for PFT, Ct, sleep study. Spoke with the office staff and they will fax over PFT and CT report. Staff states no sleep study noted.

## 2015-02-19 ENCOUNTER — Ambulatory Visit (HOSPITAL_COMMUNITY)
Admission: RE | Admit: 2015-02-19 | Discharge: 2015-02-22 | Disposition: A | Discharge status: Home / Self Care | Payer: Medicare Other | Source: Ambulatory Visit | Attending: General Surgery | Admitting: General Surgery

## 2015-02-19 ENCOUNTER — Encounter (HOSPITAL_COMMUNITY): Payer: Self-pay | Admitting: *Deleted

## 2015-02-19 ENCOUNTER — Encounter (HOSPITAL_COMMUNITY)
Admission: RE | Disposition: A | Discharge status: Home / Self Care | Payer: Self-pay | Source: Ambulatory Visit | Attending: General Surgery

## 2015-02-19 ENCOUNTER — Inpatient Hospital Stay (HOSPITAL_COMMUNITY): Payer: Medicare Other | Admitting: Vascular Surgery

## 2015-02-19 ENCOUNTER — Inpatient Hospital Stay (HOSPITAL_COMMUNITY): Payer: Medicare Other | Admitting: Anesthesiology

## 2015-02-19 DIAGNOSIS — F17210 Nicotine dependence, cigarettes, uncomplicated: Secondary | ICD-10-CM | POA: Diagnosis not present

## 2015-02-19 DIAGNOSIS — E7800 Pure hypercholesterolemia, unspecified: Secondary | ICD-10-CM | POA: Insufficient documentation

## 2015-02-19 DIAGNOSIS — K6389 Other specified diseases of intestine: Secondary | ICD-10-CM

## 2015-02-19 DIAGNOSIS — I509 Heart failure, unspecified: Secondary | ICD-10-CM | POA: Insufficient documentation

## 2015-02-19 DIAGNOSIS — I110 Hypertensive heart disease with heart failure: Secondary | ICD-10-CM | POA: Insufficient documentation

## 2015-02-19 DIAGNOSIS — F329 Major depressive disorder, single episode, unspecified: Secondary | ICD-10-CM | POA: Diagnosis not present

## 2015-02-19 DIAGNOSIS — K219 Gastro-esophageal reflux disease without esophagitis: Secondary | ICD-10-CM | POA: Diagnosis not present

## 2015-02-19 DIAGNOSIS — Z885 Allergy status to narcotic agent status: Secondary | ICD-10-CM | POA: Insufficient documentation

## 2015-02-19 DIAGNOSIS — I25119 Atherosclerotic heart disease of native coronary artery with unspecified angina pectoris: Secondary | ICD-10-CM | POA: Diagnosis not present

## 2015-02-19 DIAGNOSIS — Z9981 Dependence on supplemental oxygen: Secondary | ICD-10-CM | POA: Diagnosis not present

## 2015-02-19 DIAGNOSIS — Z23 Encounter for immunization: Secondary | ICD-10-CM | POA: Insufficient documentation

## 2015-02-19 DIAGNOSIS — F419 Anxiety disorder, unspecified: Secondary | ICD-10-CM | POA: Diagnosis not present

## 2015-02-19 DIAGNOSIS — K388 Other specified diseases of appendix: Secondary | ICD-10-CM | POA: Diagnosis not present

## 2015-02-19 DIAGNOSIS — J45909 Unspecified asthma, uncomplicated: Secondary | ICD-10-CM | POA: Insufficient documentation

## 2015-02-19 DIAGNOSIS — R109 Unspecified abdominal pain: Secondary | ICD-10-CM | POA: Diagnosis present

## 2015-02-19 DIAGNOSIS — J449 Chronic obstructive pulmonary disease, unspecified: Secondary | ICD-10-CM | POA: Insufficient documentation

## 2015-02-19 DIAGNOSIS — M7989 Other specified soft tissue disorders: Secondary | ICD-10-CM | POA: Insufficient documentation

## 2015-02-19 DIAGNOSIS — Z86010 Personal history of colonic polyps: Secondary | ICD-10-CM | POA: Insufficient documentation

## 2015-02-19 DIAGNOSIS — Z888 Allergy status to other drugs, medicaments and biological substances status: Secondary | ICD-10-CM | POA: Insufficient documentation

## 2015-02-19 DIAGNOSIS — Z6841 Body Mass Index (BMI) 40.0 and over, adult: Secondary | ICD-10-CM | POA: Diagnosis not present

## 2015-02-19 DIAGNOSIS — M1990 Unspecified osteoarthritis, unspecified site: Secondary | ICD-10-CM | POA: Insufficient documentation

## 2015-02-19 DIAGNOSIS — Z880 Allergy status to penicillin: Secondary | ICD-10-CM | POA: Diagnosis not present

## 2015-02-19 HISTORY — PX: LAPAROSCOPIC APPENDECTOMY: SHX408

## 2015-02-19 HISTORY — DX: Other specified diseases of intestine: K63.89

## 2015-02-19 LAB — CBC
HCT: 43.1 % (ref 36.0–46.0)
HEMOGLOBIN: 14.4 g/dL (ref 12.0–15.0)
MCH: 31.5 pg (ref 26.0–34.0)
MCHC: 33.4 g/dL (ref 30.0–36.0)
MCV: 94.3 fL (ref 78.0–100.0)
PLATELETS: 193 10*3/uL (ref 150–400)
RBC: 4.57 MIL/uL (ref 3.87–5.11)
RDW: 13.3 % (ref 11.5–15.5)
WBC: 14.7 10*3/uL — ABNORMAL HIGH (ref 4.0–10.5)

## 2015-02-19 LAB — CREATININE, SERUM
CREATININE: 0.94 mg/dL (ref 0.44–1.00)
GFR calc Af Amer: >60 mL/min (ref >60)
GFR calc non Af Amer: 56 mL/min — ABNORMAL LOW (ref >60)

## 2015-02-19 SURGERY — APPENDECTOMY, LAPAROSCOPIC
Anesthesia: General | Site: Abdomen

## 2015-02-19 MED ORDER — ACETAMINOPHEN 325 MG PO TABS: 325.0000 mg | ORAL_TABLET | ORAL | Status: DC | PRN

## 2015-02-19 MED ORDER — DEXTROSE 5 % IV SOLN
5.0000 mg/kg | INTRAVENOUS | Status: DC
  Filled 2015-02-19: qty 14

## 2015-02-19 MED ORDER — FENTANYL CITRATE (PF) 100 MCG/2ML IJ SOLN
12.5000 ug | INTRAMUSCULAR | Status: DC | PRN
  Administered 2015-02-19 – 2015-02-20 (×5): 25 ug via INTRAVENOUS
  Administered 2015-02-21 (×2): 12.5 ug via INTRAVENOUS
  Administered 2015-02-21: 25 ug via INTRAVENOUS
  Filled 2015-02-19 (×10): qty 2

## 2015-02-19 MED ORDER — SODIUM CHLORIDE 0.9 % IR SOLN
Status: DC | PRN
  Administered 2015-02-19: 1000 mL

## 2015-02-19 MED ORDER — PHENYLEPHRINE 40 MCG/ML (10ML) SYRINGE FOR IV PUSH (FOR BLOOD PRESSURE SUPPORT)
PREFILLED_SYRINGE | INTRAVENOUS | Status: AC
  Filled 2015-02-19: qty 10

## 2015-02-19 MED ORDER — GLYCOPYRROLATE 0.2 MG/ML IJ SOLN
INTRAMUSCULAR | Status: AC
  Filled 2015-02-19: qty 1

## 2015-02-19 MED ORDER — PROPOFOL 10 MG/ML IV BOLUS
INTRAVENOUS | Status: DC | PRN
  Administered 2015-02-19: 20 mg via INTRAVENOUS
  Administered 2015-02-19: 130 mg via INTRAVENOUS

## 2015-02-19 MED ORDER — ONDANSETRON HCL 4 MG/2ML IJ SOLN
4.0000 mg | Freq: Four times a day (QID) | INTRAMUSCULAR | Status: DC | PRN
  Administered 2015-02-20 (×3): 4 mg via INTRAVENOUS
  Filled 2015-02-19 (×4): qty 2

## 2015-02-19 MED ORDER — LIDOCAINE HCL 1 % IJ SOLN
INTRAMUSCULAR | Status: DC | PRN
  Administered 2015-02-19: 27 mL via INTRAMUSCULAR

## 2015-02-19 MED ORDER — CLINDAMYCIN PHOSPHATE 900 MG/50ML IV SOLN
900.0000 mg | Freq: Three times a day (TID) | INTRAVENOUS | Status: AC
  Administered 2015-02-19 – 2015-02-20 (×2): 900 mg via INTRAVENOUS
  Filled 2015-02-19 (×2): qty 50

## 2015-02-19 MED ORDER — LACTATED RINGERS IV SOLN
Freq: Once | INTRAVENOUS | Status: AC
  Administered 2015-02-19: 10:00:00 via INTRAVENOUS

## 2015-02-19 MED ORDER — ONDANSETRON HCL 4 MG/2ML IJ SOLN
INTRAMUSCULAR | Status: AC
  Filled 2015-02-19: qty 2

## 2015-02-19 MED ORDER — PROPOFOL 10 MG/ML IV BOLUS
INTRAVENOUS | Status: AC
  Filled 2015-02-19: qty 20

## 2015-02-19 MED ORDER — POTASSIUM CHLORIDE CRYS ER 10 MEQ PO TBCR
10.0000 meq | EXTENDED_RELEASE_TABLET | Freq: Every day | ORAL | Status: DC
  Administered 2015-02-20 – 2015-02-22 (×3): 10 meq via ORAL
  Filled 2015-02-19 (×3): qty 1

## 2015-02-19 MED ORDER — ROSUVASTATIN CALCIUM 40 MG PO TABS
40.0000 mg | ORAL_TABLET | Freq: Every day | ORAL | Status: DC
  Administered 2015-02-20 – 2015-02-21 (×2): 40 mg via ORAL
  Filled 2015-02-19 (×4): qty 1

## 2015-02-19 MED ORDER — FUROSEMIDE 40 MG PO TABS
40.0000 mg | ORAL_TABLET | Freq: Every day | ORAL | Status: DC
  Administered 2015-02-20 – 2015-02-22 (×3): 40 mg via ORAL
  Filled 2015-02-19 (×4): qty 1

## 2015-02-19 MED ORDER — CIPROFLOXACIN IN D5W 400 MG/200ML IV SOLN: 400.0000 mg | Freq: Two times a day (BID) | INTRAVENOUS | Status: DC

## 2015-02-19 MED ORDER — CHLORHEXIDINE GLUCONATE CLOTH 2 % EX PADS: 6.0000 | MEDICATED_PAD | Freq: Once | CUTANEOUS | Status: DC

## 2015-02-19 MED ORDER — ONDANSETRON HCL 4 MG/2ML IJ SOLN
INTRAMUSCULAR | Status: AC
  Administered 2015-02-19: 4 mg
  Filled 2015-02-19: qty 2

## 2015-02-19 MED ORDER — BUDESONIDE-FORMOTEROL FUMARATE 160-4.5 MCG/ACT IN AERO
2.0000 | INHALATION_SPRAY | Freq: Two times a day (BID) | RESPIRATORY_TRACT | Status: DC
  Administered 2015-02-19 – 2015-02-22 (×6): 2 via RESPIRATORY_TRACT
  Filled 2015-02-19: qty 6

## 2015-02-19 MED ORDER — GENTAMICIN SULFATE 40 MG/ML IJ SOLN
5.0000 mg/kg | INTRAVENOUS | Status: AC
  Administered 2015-02-19: 380 mg via INTRAVENOUS
  Filled 2015-02-19: qty 9.5

## 2015-02-19 MED ORDER — ALBUTEROL SULFATE (2.5 MG/3ML) 0.083% IN NEBU
2.5000 mg | INHALATION_SOLUTION | Freq: Four times a day (QID) | RESPIRATORY_TRACT | Status: DC | PRN
Start: 2015-02-19 — End: 2015-02-22

## 2015-02-19 MED ORDER — DIPHENHYDRAMINE HCL 50 MG/ML IJ SOLN
12.5000 mg | Freq: Four times a day (QID) | INTRAMUSCULAR | Status: DC | PRN
  Filled 2015-02-19: qty 1

## 2015-02-19 MED ORDER — GLYCOPYRROLATE 0.2 MG/ML IJ SOLN
INTRAMUSCULAR | Status: DC | PRN
  Administered 2015-02-19 (×2): 0.2 mg via INTRAVENOUS

## 2015-02-19 MED ORDER — SUGAMMADEX SODIUM 200 MG/2ML IV SOLN
INTRAVENOUS | Status: DC | PRN
  Administered 2015-02-19: 200 mg via INTRAVENOUS

## 2015-02-19 MED ORDER — CLINDAMYCIN PHOSPHATE 900 MG/50ML IV SOLN
900.0000 mg | INTRAVENOUS | Status: AC
  Administered 2015-02-19: 900 mg via INTRAVENOUS
  Filled 2015-02-19: qty 50

## 2015-02-19 MED ORDER — HEPARIN SODIUM (PORCINE) 5000 UNIT/ML IJ SOLN
5000.0000 [IU] | Freq: Three times a day (TID) | INTRAMUSCULAR | Status: DC
Start: 2015-02-20 — End: 2015-02-22
  Administered 2015-02-20 – 2015-02-22 (×8): 5000 [IU] via SUBCUTANEOUS
  Filled 2015-02-19 (×7): qty 1

## 2015-02-19 MED ORDER — EPHEDRINE SULFATE 50 MG/ML IJ SOLN
INTRAMUSCULAR | Status: DC | PRN
  Administered 2015-02-19 (×2): 10 mg via INTRAVENOUS

## 2015-02-19 MED ORDER — OXYCODONE HCL 5 MG/5ML PO SOLN: 5.0000 mg | Freq: Once | ORAL | Status: DC | PRN

## 2015-02-19 MED ORDER — NEOMYCIN SULFATE 500 MG PO TABS
1000.0000 mg | ORAL_TABLET | ORAL | Status: DC
  Filled 2015-02-19: qty 2

## 2015-02-19 MED ORDER — LIDOCAINE HCL (CARDIAC) 20 MG/ML IV SOLN
INTRAVENOUS | Status: DC | PRN
  Administered 2015-02-19: 40 mg via INTRAVENOUS

## 2015-02-19 MED ORDER — ACETAMINOPHEN 160 MG/5ML PO SOLN
325.0000 mg | ORAL | Status: DC | PRN
  Filled 2015-02-19: qty 20.3

## 2015-02-19 MED ORDER — SCOPOLAMINE 1 MG/3DAYS TD PT72
MEDICATED_PATCH | TRANSDERMAL | Status: AC
  Filled 2015-02-19: qty 1

## 2015-02-19 MED ORDER — ACETAMINOPHEN 325 MG PO TABS
650.0000 mg | ORAL_TABLET | Freq: Four times a day (QID) | ORAL | Status: DC | PRN
  Administered 2015-02-22: 650 mg via ORAL
  Filled 2015-02-19 (×3): qty 2

## 2015-02-19 MED ORDER — OXYCODONE HCL 5 MG PO TABS: 5.0000 mg | ORAL_TABLET | Freq: Once | ORAL | Status: DC | PRN

## 2015-02-19 MED ORDER — ASPIRIN EC 81 MG PO TBEC
81.0000 mg | DELAYED_RELEASE_TABLET | Freq: Every day | ORAL | Status: DC
  Administered 2015-02-20 – 2015-02-22 (×3): 81 mg via ORAL
  Filled 2015-02-19 (×3): qty 1

## 2015-02-19 MED ORDER — SUCCINYLCHOLINE CHLORIDE 20 MG/ML IJ SOLN
INTRAMUSCULAR | Status: AC
  Filled 2015-02-19: qty 1

## 2015-02-19 MED ORDER — METOPROLOL SUCCINATE ER 25 MG PO TB24
25.0000 mg | ORAL_TABLET | Freq: Every day | ORAL | Status: DC
  Administered 2015-02-20 – 2015-02-22 (×3): 25 mg via ORAL
  Filled 2015-02-19 (×3): qty 1

## 2015-02-19 MED ORDER — PANTOPRAZOLE SODIUM 40 MG PO TBEC
40.0000 mg | DELAYED_RELEASE_TABLET | Freq: Two times a day (BID) | ORAL | Status: DC
  Administered 2015-02-20 – 2015-02-22 (×6): 40 mg via ORAL
  Filled 2015-02-19 (×6): qty 1

## 2015-02-19 MED ORDER — BUPIVACAINE-EPINEPHRINE (PF) 0.25% -1:200000 IJ SOLN
INTRAMUSCULAR | Status: AC
  Filled 2015-02-19: qty 30

## 2015-02-19 MED ORDER — FUROSEMIDE 20 MG PO TABS
20.0000 mg | ORAL_TABLET | Freq: Every day | ORAL | Status: DC
  Administered 2015-02-19 – 2015-02-21 (×2): 20 mg via ORAL
  Filled 2015-02-19 (×3): qty 1

## 2015-02-19 MED ORDER — ALBUTEROL SULFATE HFA 108 (90 BASE) MCG/ACT IN AERS: 2.0000 | INHALATION_SPRAY | Freq: Four times a day (QID) | RESPIRATORY_TRACT | Status: DC | PRN

## 2015-02-19 MED ORDER — 0.9 % SODIUM CHLORIDE (POUR BTL) OPTIME
TOPICAL | Status: DC | PRN
  Administered 2015-02-19: 1000 mL

## 2015-02-19 MED ORDER — SIMETHICONE 80 MG PO CHEW
40.0000 mg | CHEWABLE_TABLET | Freq: Four times a day (QID) | ORAL | Status: DC | PRN
  Administered 2015-02-20: 40 mg via ORAL
  Filled 2015-02-19: qty 1

## 2015-02-19 MED ORDER — FUROSEMIDE 40 MG PO TABS: 40.0000 mg | ORAL_TABLET | ORAL | Status: DC

## 2015-02-19 MED ORDER — FENTANYL CITRATE (PF) 250 MCG/5ML IJ SOLN
INTRAMUSCULAR | Status: AC
  Filled 2015-02-19: qty 5

## 2015-02-19 MED ORDER — ERYTHROMYCIN BASE 250 MG PO TABS
1000.0000 mg | ORAL_TABLET | ORAL | Status: DC
  Filled 2015-02-19: qty 4

## 2015-02-19 MED ORDER — CITALOPRAM HYDROBROMIDE 20 MG PO TABS
30.0000 mg | ORAL_TABLET | Freq: Every day | ORAL | Status: DC
  Administered 2015-02-20 – 2015-02-22 (×3): 30 mg via ORAL
  Filled 2015-02-19 (×3): qty 2

## 2015-02-19 MED ORDER — FENTANYL CITRATE (PF) 100 MCG/2ML IJ SOLN
INTRAMUSCULAR | Status: DC | PRN
  Administered 2015-02-19: 50 ug via INTRAVENOUS
  Administered 2015-02-19: 100 ug via INTRAVENOUS

## 2015-02-19 MED ORDER — INFLUENZA VAC SPLIT QUAD 0.5 ML IM SUSY
0.5000 mL | PREFILLED_SYRINGE | INTRAMUSCULAR | Status: DC
  Filled 2015-02-19: qty 0.5

## 2015-02-19 MED ORDER — ALVIMOPAN 12 MG PO CAPS
12.0000 mg | ORAL_CAPSULE | Freq: Once | ORAL | Status: AC
  Administered 2015-02-19: 12 mg via ORAL
  Filled 2015-02-19: qty 1

## 2015-02-19 MED ORDER — FENTANYL CITRATE (PF) 100 MCG/2ML IJ SOLN
25.0000 ug | INTRAMUSCULAR | Status: DC | PRN
  Administered 2015-02-19: 25 ug via INTRAVENOUS
  Administered 2015-02-19: 50 ug via INTRAVENOUS

## 2015-02-19 MED ORDER — DEXAMETHASONE SODIUM PHOSPHATE 4 MG/ML IJ SOLN
INTRAMUSCULAR | Status: DC | PRN
  Administered 2015-02-19: 4 mg via INTRAVENOUS

## 2015-02-19 MED ORDER — FENTANYL CITRATE (PF) 100 MCG/2ML IJ SOLN
INTRAMUSCULAR | Status: AC
  Administered 2015-02-19: 25 ug via INTRAVENOUS
  Filled 2015-02-19: qty 2

## 2015-02-19 MED ORDER — SUGAMMADEX SODIUM 200 MG/2ML IV SOLN
INTRAVENOUS | Status: AC
  Filled 2015-02-19: qty 2

## 2015-02-19 MED ORDER — ROCURONIUM BROMIDE 100 MG/10ML IV SOLN
INTRAVENOUS | Status: DC | PRN
  Administered 2015-02-19: 10 mg via INTRAVENOUS
  Administered 2015-02-19: 40 mg via INTRAVENOUS

## 2015-02-19 MED ORDER — ONDANSETRON 4 MG PO TBDP: 4.0000 mg | ORAL_TABLET | Freq: Four times a day (QID) | ORAL | Status: DC | PRN

## 2015-02-19 MED ORDER — LACTATED RINGERS IV SOLN
INTRAVENOUS | Status: DC | PRN
  Administered 2015-02-19 (×2): via INTRAVENOUS

## 2015-02-19 MED ORDER — OXYCODONE HCL 5 MG PO TABS
5.0000 mg | ORAL_TABLET | ORAL | Status: DC | PRN
  Administered 2015-02-20 – 2015-02-22 (×8): 5 mg via ORAL
  Filled 2015-02-19 (×10): qty 1

## 2015-02-19 MED ORDER — DOCUSATE SODIUM 100 MG PO CAPS
100.0000 mg | ORAL_CAPSULE | Freq: Two times a day (BID) | ORAL | Status: DC
  Administered 2015-02-19 – 2015-02-21 (×4): 100 mg via ORAL
  Filled 2015-02-19 (×4): qty 1

## 2015-02-19 MED ORDER — SUCCINYLCHOLINE CHLORIDE 20 MG/ML IJ SOLN
INTRAMUSCULAR | Status: DC | PRN
  Administered 2015-02-19: 60 mg via INTRAVENOUS

## 2015-02-19 MED ORDER — ONDANSETRON HCL 4 MG/2ML IJ SOLN
INTRAMUSCULAR | Status: DC | PRN
  Administered 2015-02-19 (×2): 4 mg via INTRAVENOUS

## 2015-02-19 MED ORDER — PHENYLEPHRINE HCL 10 MG/ML IJ SOLN
10.0000 mg | INTRAVENOUS | Status: DC | PRN
  Administered 2015-02-19: 10 ug/min via INTRAVENOUS

## 2015-02-19 MED ORDER — KCL IN DEXTROSE-NACL 20-5-0.45 MEQ/L-%-% IV SOLN
INTRAVENOUS | Status: DC
Start: 2015-02-19 — End: 2015-02-22
  Administered 2015-02-19 – 2015-02-21 (×3): via INTRAVENOUS
  Filled 2015-02-19 (×3): qty 1000

## 2015-02-19 MED ORDER — LIDOCAINE HCL (PF) 1 % IJ SOLN
INTRAMUSCULAR | Status: AC
  Filled 2015-02-19: qty 30

## 2015-02-19 MED ORDER — DEXAMETHASONE SODIUM PHOSPHATE 4 MG/ML IJ SOLN
INTRAMUSCULAR | Status: AC
  Filled 2015-02-19: qty 1

## 2015-02-19 MED ORDER — ACETAMINOPHEN 650 MG RE SUPP: 650.0000 mg | Freq: Four times a day (QID) | RECTAL | Status: DC | PRN

## 2015-02-19 MED ORDER — CLONAZEPAM 1 MG PO TABS
1.0000 mg | ORAL_TABLET | Freq: Every day | ORAL | Status: DC
  Administered 2015-02-20 – 2015-02-22 (×3): 1 mg via ORAL
  Filled 2015-02-19 (×3): qty 1

## 2015-02-19 MED ORDER — DIPHENHYDRAMINE HCL 12.5 MG/5ML PO ELIX: 12.5000 mg | ORAL_SOLUTION | Freq: Four times a day (QID) | ORAL | Status: DC | PRN

## 2015-02-19 MED ORDER — PHENYLEPHRINE HCL 10 MG/ML IJ SOLN
INTRAMUSCULAR | Status: DC | PRN
  Administered 2015-02-19 (×2): 80 ug via INTRAVENOUS

## 2015-02-19 SURGICAL SUPPLY — 51 items
APPLIER CLIP ROT 10 11.4 M/L (STAPLE)
BLADE SURG ROTATE 9660 (MISCELLANEOUS) IMPLANT
CANISTER SUCTION 2500CC (MISCELLANEOUS) ×3 IMPLANT
CHLORAPREP W/TINT 26ML (MISCELLANEOUS) ×3 IMPLANT
CLIP APPLIE ROT 10 11.4 M/L (STAPLE) IMPLANT
COVER SURGICAL LIGHT HANDLE (MISCELLANEOUS) ×3 IMPLANT
CUTTER FLEX LINEAR 45M (STAPLE) IMPLANT
DRAPE WARM FLUID 44X44 (DRAPE) ×3 IMPLANT
ELECT REM PT RETURN 9FT ADLT (ELECTROSURGICAL) ×3
ELECTRODE REM PT RTRN 9FT ADLT (ELECTROSURGICAL) ×1 IMPLANT
ENDOLOOP SUT PDS II  0 18 (SUTURE)
ENDOLOOP SUT PDS II 0 18 (SUTURE) IMPLANT
GLOVE BIO SURGEON STRL SZ 6 (GLOVE) ×3 IMPLANT
GLOVE BIO SURGEON STRL SZ 6.5 (GLOVE) ×4 IMPLANT
GLOVE BIO SURGEONS STRL SZ 6.5 (GLOVE) ×2
GLOVE BIOGEL PI IND STRL 6.5 (GLOVE) ×2 IMPLANT
GLOVE BIOGEL PI IND STRL 7.0 (GLOVE) ×2 IMPLANT
GLOVE BIOGEL PI INDICATOR 6.5 (GLOVE) ×4
GLOVE BIOGEL PI INDICATOR 7.0 (GLOVE) ×4
GLOVE ECLIPSE 6.5 STRL STRAW (GLOVE) ×3 IMPLANT
GOWN STRL REUS W/ TWL LRG LVL3 (GOWN DISPOSABLE) ×4 IMPLANT
GOWN STRL REUS W/TWL 2XL LVL3 (GOWN DISPOSABLE) ×3 IMPLANT
GOWN STRL REUS W/TWL LRG LVL3 (GOWN DISPOSABLE) ×8
KIT BASIN OR (CUSTOM PROCEDURE TRAY) ×3 IMPLANT
KIT ROOM TURNOVER OR (KITS) ×3 IMPLANT
L-HOOK LAP DISP 36CM (ELECTROSURGICAL) ×3
LHOOK LAP DISP 36CM (ELECTROSURGICAL) ×1 IMPLANT
LIQUID BAND (GAUZE/BANDAGES/DRESSINGS) ×3 IMPLANT
NS IRRIG 1000ML POUR BTL (IV SOLUTION) ×3 IMPLANT
PAD ARMBOARD 7.5X6 YLW CONV (MISCELLANEOUS) ×6 IMPLANT
PENCIL BUTTON HOLSTER BLD 10FT (ELECTRODE) ×3 IMPLANT
POUCH SPECIMEN RETRIEVAL 10MM (ENDOMECHANICALS) ×3 IMPLANT
RELOAD STAPLE TA45 3.5 REG BLU (ENDOMECHANICALS) IMPLANT
SCALPEL HARMONIC ACE (MISCELLANEOUS) ×3 IMPLANT
SET IRRIG TUBING LAPAROSCOPIC (IRRIGATION / IRRIGATOR) ×3 IMPLANT
SLEEVE ENDOPATH XCEL 5M (ENDOMECHANICALS) ×3 IMPLANT
SPECIMEN JAR SMALL (MISCELLANEOUS) ×3 IMPLANT
SUT MNCRL AB 4-0 PS2 18 (SUTURE) ×3 IMPLANT
SUT PDS AB 1 CT  36 (SUTURE) ×4
SUT PDS AB 1 CT 36 (SUTURE) ×2 IMPLANT
SUT VIC AB 3-0 SH 27 (SUTURE) ×2
SUT VIC AB 3-0 SH 27X BRD (SUTURE) ×1 IMPLANT
SYS LAPSCP GELPORT 120MM (MISCELLANEOUS) ×3
SYSTEM LAPSCP GELPORT 120MM (MISCELLANEOUS) ×1 IMPLANT
TOWEL OR 17X24 6PK STRL BLUE (TOWEL DISPOSABLE) IMPLANT
TOWEL OR 17X26 10 PK STRL BLUE (TOWEL DISPOSABLE) ×3 IMPLANT
TRAY FOLEY CATH 16FR SILVER (SET/KITS/TRAYS/PACK) ×3 IMPLANT
TRAY LAPAROSCOPIC MC (CUSTOM PROCEDURE TRAY) ×3 IMPLANT
TROCAR XCEL BLUNT TIP 100MML (ENDOMECHANICALS) ×3 IMPLANT
TROCAR XCEL NON-BLD 5MMX100MML (ENDOMECHANICALS) ×3 IMPLANT
TUBING INSUFFLATION (TUBING) ×3 IMPLANT

## 2015-02-19 NOTE — Anesthesia Procedure Notes (Addendum)
Procedure Name: Intubation Date/Time: 02/19/2015 11:22 AM Performed by: Jacquiline Doe A Pre-anesthesia Checklist: Patient identified, Timeout performed, Emergency Drugs available, Suction available and Patient being monitored Patient Re-evaluated:Patient Re-evaluated prior to inductionOxygen Delivery Method: Circle system utilized Preoxygenation: Pre-oxygenation with 100% oxygen Intubation Type: IV induction Ventilation: Mask ventilation without difficulty and Oral airway inserted - appropriate to patient size Laryngoscope Size: Mac and 3 Tube type: Oral Tube size: 7.0 mm Number of attempts: 1 Airway Equipment and Method: Stylet Placement Confirmation: positive ETCO2,  breath sounds checked- equal and bilateral and ETT inserted through vocal cords under direct vision Secured at: 22 cm Tube secured with: Tape Dental Injury: Teeth and Oropharynx as per pre-operative assessment  Comments: Intubation per K. BINDER , SRNA

## 2015-02-19 NOTE — Anesthesia Postprocedure Evaluation (Signed)
Anesthesia Post Note  Patient: Danielle [name]  Procedure(s) Performed: Procedure(s) (LRB): EXCISION OF MESENTERIC MASS (N/A)  Patient location during evaluation: PACU Anesthesia Type: General Level of consciousness: awake Pain management: pain level controlled Respiratory status: spontaneous breathing Cardiovascular status: stable Anesthetic complications: no    Last Vitals:  Filed Vitals:   02/19/15 1445 02/19/15 1515  BP: 131/57 132/63  Pulse: 79 78  Temp: 36.3 C 36.4 C  Resp: 17 17    Last Pain:  Filed Vitals:   02/19/15 1624  PainSc: Asleep                 EDWARDS,Melita Villalona

## 2015-02-19 NOTE — Progress Notes (Signed)
MD called with orders to do bladder scan and if greater than 250 may  Do in and out catheter or foley if greater( MD Barry Dienes)

## 2015-02-19 NOTE — Progress Notes (Signed)
Bladder scan 315 foley catheter  Inserted as ordered, patient states she feels much better

## 2015-02-19 NOTE — Op Note (Addendum)
Laparoscopic excision of mesenteric mass  Indications: The patient presented with a history of right-sided abdominal pain. A CT revealed findings consistent with mucocele of appendiceal stump.  Pre-operative Diagnosis: Mucocele  Post-operative Diagnosis: mesenteric mass in epiploic appendage  Surgeon: [facility]   Assistants: none  Anesthesia: General endotracheal anesthesia and Local anesthesia 1% plain lidocaine, 0.25.% bupivacaine, with epinephrine  ASA Class: 3  Procedure Details  The patient was seen again in the Holding Room. The risks, benefits, complications, treatment options, and expected outcomes were discussed with the patient and/or family. The possibilities of perforation of viscus, bleeding, recurrent infection, the need for additional procedures, failure to diagnose a condition, and creating a complication requiring transfusion or operation were discussed. There was concurrence with the proposed plan and informed consent was obtained. The site of surgery was properly noted. The patient was taken to Operating Room, identified as PANZIE BOORAS and the procedure verified as lap appendectomy, possible ileocolectomy for mucocele. A Time Out was held and the above information confirmed.  The patient was placed in the supine position and general anesthesia was induced, along with placement of orogastric tube, Venodyne boots, and a Foley catheter. The abdomen was prepped and draped in a sterile fashion. Local anesthetic was infiltrated in the infraumbilical region.  A 1.5 cm curvilinear transverse incision was made just below the umbilicus.  The Kelly clamp was used to spread the subcutaneous tissues.  The fascia was elevated with 2 Kocher clamps and incised with the #11 blade.  A Claiborne Billings was used to confirm entrance into the peritoneal cavity.  A pursestring suture was placed around the fascial incision.  The Hasson trocar was inserted into the abdomen and held in place with the  tails of the suture.  The pneumoperitoneum was then established to steady pressure of 15 mmHg.     Additional 5 mm cannulas then placed in the left lower quadrant of the abdomen and the suprapubic region under direct visualization.  A careful evaluation of the entire abdomen was carried out. The patient was placed in Trendelenburg and rotated to the left.  The small intestines were retracted in the cephalad and left lateral direction away from the pelvis and right lower quadrant. The distal small bowel was tethered.  This was dissected sharply from the retroperitoneum and rotated medially.  The cecum was mobilized.  It felt like there was a firm area in the epiploic appendage.  This was resected with the Harmonic scalpel.  It felt much less firm after removing it from the abdomen.  The Hasson was extended into a hand port.  The terminal ileum and cecum was palpated.  The distal small bowel was also run.  There was no other palpable mass in this region.    A four quadrant inspection was performed.  There was no evidence of succous or bleeding in the abdomen. Irrigation was also performed and irrigate suctioned from the abdomen as well.  The 5 mm trocars were removed.  The pneumoperitoneum was evacuated from the abdomen.  The fascia was closed with #1 PDS suture.  The trocar site skin wounds were closed with 4-0 Monocryl and dressed with Dermabond.  Instrument, sponge, and needle counts were correct at the conclusion of the case.   Findings: Small mass in the epiploic appendage.    Estimated Blood Loss:  Minimal         Drains: none          Specimens: mesenteric mass  Complications:  None; patient tolerated the procedure well.         Disposition: PACU - hemodynamically stable.         Condition: stable

## 2015-02-19 NOTE — Anesthesia Preprocedure Evaluation (Signed)
Anesthesia Evaluation  Patient identified by MRN, date of birth, ID band Patient awake    Reviewed: Allergy & Precautions, NPO status , Patient's Chart, lab work & pertinent test results  History of Anesthesia Complications (+) PONV and history of anesthetic complications  Airway Mallampati: II  TM Distance: >3 FB Neck ROM: Full    Dental no notable dental hx.    Pulmonary shortness of breath, neg sleep apnea, COPD,  COPD inhaler and oxygen dependent, neg recent URI, Current Smoker, neg PE    + decreased breath sounds      Cardiovascular hypertension, Pt. on medications + angina + CAD and +CHF  (-) Past MI, (-) Cardiac Stents and (-) CABG (-) dysrhythmias  Rhythm:Regular     Neuro/Psych  Headaches, neg Seizures PSYCHIATRIC DISORDERS Depression    GI/Hepatic Neg liver ROS, GERD  Medicated and Controlled,  Endo/Other  Morbid obesity  Renal/GU negative Renal ROS     Musculoskeletal  (+) Arthritis ,   Abdominal   Peds  Hematology   Anesthesia Other Findings   Reproductive/Obstetrics                             Anesthesia Physical Anesthesia Plan  ASA: III  Anesthesia Plan: General   Post-op Pain Management:    Induction: Intravenous  Airway Management Planned: Oral ETT  Additional Equipment: None  Intra-op Plan:   Post-operative Plan: Extubation in OR  Informed Consent: I have reviewed the patients History and Physical, chart, labs and discussed the procedure including the risks, benefits and alternatives for the proposed anesthesia with the patient or authorized representative who has indicated his/her understanding and acceptance.   Dental advisory given  Plan Discussed with: Surgeon and CRNA  Anesthesia Plan Comments:         Anesthesia Quick Evaluation

## 2015-02-19 NOTE — Transfer of Care (Signed)
Immediate Anesthesia Transfer of Care Note  Patient: Danielle [name]  Procedure(s) Performed: Procedure(s): EXCISION OF MESENTERIC MASS (N/A)  Patient Location: PACU  Anesthesia Type:General  Level of Consciousness: awake and alert   Airway & Oxygen Therapy: Patient Spontanous Breathing and Patient connected to face mask oxygen  Post-op Assessment: Report given to RN, Post -op Vital signs reviewed and stable and Patient moving all extremities X 4  Post vital signs: Reviewed and stable  Last Vitals:  Filed Vitals:   02/19/15 0924  BP: 128/44  Pulse: 65  Temp: 36.6 C  Resp: 20    Complications: No apparent anesthesia complications

## 2015-02-19 NOTE — Interval H&P Note (Signed)
History and Physical Interval Note:  02/19/2015 10:48 AM  Danielle [name]  has presented today for surgery, with the diagnosis of MUCOCELE OF THE APPENDIX  The various methods of treatment have been discussed with the patient and family. After consideration of risks, benefits and other options for treatment, the patient has consented to  Procedure(s): APPENDECTOMY LAPAROSCOPIC POSSIBLE ILEOCOLECTOMY (N/A) as a surgical intervention .  The patient's history has been reviewed, patient examined, no change in status, stable for surgery.  I have reviewed the patient's chart and labs.  Questions were answered to the patient's satisfaction.     Roseanna Koplin

## 2015-02-19 NOTE — Progress Notes (Signed)
Patient c/o not being able to void up with help ,will call MD.

## 2015-02-20 ENCOUNTER — Encounter (HOSPITAL_COMMUNITY): Payer: Self-pay | Admitting: General Surgery

## 2015-02-20 DIAGNOSIS — K388 Other specified diseases of appendix: Secondary | ICD-10-CM | POA: Diagnosis not present

## 2015-02-20 LAB — BASIC METABOLIC PANEL
Anion gap: 8 (ref 5–15)
BUN: 15 mg/dL (ref 6–20)
CALCIUM: 8.6 mg/dL — AB (ref 8.9–10.3)
CHLORIDE: 101 mmol/L (ref 101–111)
CO2: 24 mmol/L (ref 22–32)
CREATININE: 0.97 mg/dL (ref 0.44–1.00)
GFR calc non Af Amer: 54 mL/min — ABNORMAL LOW (ref >60)
GLUCOSE: 160 mg/dL — AB (ref 65–99)
Potassium: 4.4 mmol/L (ref 3.5–5.1)
Sodium: 133 mmol/L — ABNORMAL LOW (ref 135–145)

## 2015-02-20 LAB — CBC
HEMATOCRIT: 39.5 % (ref 36.0–46.0)
HEMOGLOBIN: 12.6 g/dL (ref 12.0–15.0)
MCH: 29.9 pg (ref 26.0–34.0)
MCHC: 31.9 g/dL (ref 30.0–36.0)
MCV: 93.8 fL (ref 78.0–100.0)
Platelets: 194 10*3/uL (ref 150–400)
RBC: 4.21 MIL/uL (ref 3.87–5.11)
RDW: 13.3 % (ref 11.5–15.5)
WBC: 11.4 10*3/uL — ABNORMAL HIGH (ref 4.0–10.5)

## 2015-02-20 MED ORDER — OXYCODONE HCL 5 MG PO TABS: 5.0000 mg | ORAL_TABLET | ORAL | Status: DC | PRN

## 2015-02-20 NOTE — Progress Notes (Signed)
1 Day Post-Op  Subjective: Had to get I&O cath last night.  Having some pain.    Objective: Vital signs in last 24 hours: Temp:  [97.4 F (36.3 C)-97.9 F (36.6 C)] 97.6 F (36.4 C) (11/22 1322) Pulse Rate:  [54-79] 54 (11/22 1322) Resp:  [16-18] 18 (11/22 1322) BP: (117-138)/(40-68) 138/51 mmHg (11/22 1322) SpO2:  [92 %-100 %] 100 % (11/22 1322)    Intake/Output from previous day: 11/21 0701 - 11/22 0700 In: 2265.3 [P.O.:600; I.V.:1555.8; IV Piggyback:109.5] Out: 1100 [Urine:1075; Blood:25] Intake/Output this shift: Total I/O In: 540 [P.O.:540] Out: 350 [Urine:350]  General appearance: alert and mild distress Resp: breathing comfortably GI: soft, non distended, approp tender.  Lab Results:   Recent Labs  02/19/15 1655 02/20/15 0349  WBC 14.7* 11.4*  HGB 14.4 12.6  HCT 43.1 39.5  PLT 193 194   BMET  Recent Labs  02/19/15 1655 02/20/15 0349  NA  --  133*  K  --  4.4  CL  --  101  CO2  --  24  GLUCOSE  --  160*  BUN  --  15  CREATININE 0.94 0.97  CALCIUM  --  8.6*   PT/INR No results for input(s): LABPROT, INR in the last 72 hours. ABG No results for input(s): PHART, HCO3 in the last 72 hours.  Invalid input(s): PCO2, PO2  Studies/Results: No results found.  Anti-infectives: Anti-infectives    Start     Dose/Rate Route Frequency Ordered Stop   02/19/15 1930  clindamycin (CLEOCIN) IVPB 900 mg     900 mg 100 mL/hr over 30 Minutes Intravenous 3 times per day 02/19/15 1534 02/20/15 0641   02/19/15 1515  ciprofloxacin (CIPRO) IVPB 400 mg  Status:  Discontinued     400 mg 200 mL/hr over 60 Minutes Intravenous Every 12 hours 02/19/15 1510 02/19/15 1533   02/19/15 1119  gentamicin (GARAMYCIN) 380 mg in dextrose 5 % 100 mL IVPB     5 mg/kg  76.4 kg (Adjusted) 109.5 mL/hr over 60 Minutes Intravenous 60 min pre-op 02/19/15 1120 02/19/15 1205   02/19/15 1117  clindamycin (CLEOCIN) IVPB 900 mg     900 mg 100 mL/hr over 30 Minutes Intravenous 60 min  pre-op 02/19/15 1117 02/19/15 1210   02/19/15 1117  gentamicin (GARAMYCIN) 560 mg in dextrose 5 % 100 mL IVPB  Status:  Discontinued     5 mg/kg  112.4 kg 114 mL/hr over 60 Minutes Intravenous 60 min pre-op 02/19/15 1117 02/19/15 1120   02/19/15 0913  neomycin (MYCIFRADIN) tablet 1,000 mg  Status:  Discontinued     1,000 mg Oral 3 times per day 02/19/15 0913 02/19/15 1455   02/19/15 0913  erythromycin (E-MYCIN) tablet 1,000 mg  Status:  Discontinued     1,000 mg Oral 3 times per day 02/19/15 0913 02/19/15 1455      Assessment/Plan: s/p Procedure(s): EXCISION OF MESENTERIC MASS (N/A) diet as tolerated.  Hope for d/c tomorrow. Await pathology   LOS: 1 day    [facility] 02/20/2015

## 2015-02-21 DIAGNOSIS — K388 Other specified diseases of appendix: Secondary | ICD-10-CM | POA: Diagnosis not present

## 2015-02-21 LAB — BASIC METABOLIC PANEL
Anion gap: 7 (ref 5–15)
BUN: 11 mg/dL (ref 6–20)
CALCIUM: 8.2 mg/dL — AB (ref 8.9–10.3)
CO2: 25 mmol/L (ref 22–32)
Chloride: 102 mmol/L (ref 101–111)
Creatinine, Ser: 0.89 mg/dL (ref 0.44–1.00)
GFR calc Af Amer: >60 mL/min (ref >60)
GLUCOSE: 263 mg/dL — AB (ref 65–99)
POTASSIUM: 4.8 mmol/L (ref 3.5–5.1)
Sodium: 134 mmol/L — ABNORMAL LOW (ref 135–145)

## 2015-02-21 LAB — CBC
HEMATOCRIT: 34.7 % — AB (ref 36.0–46.0)
Hemoglobin: 11.6 g/dL — ABNORMAL LOW (ref 12.0–15.0)
MCH: 31.5 pg (ref 26.0–34.0)
MCHC: 33.4 g/dL (ref 30.0–36.0)
MCV: 94.3 fL (ref 78.0–100.0)
PLATELETS: 161 10*3/uL (ref 150–400)
RBC: 3.68 MIL/uL — ABNORMAL LOW (ref 3.87–5.11)
RDW: 13.7 % (ref 11.5–15.5)
WBC: 8.8 10*3/uL (ref 4.0–10.5)

## 2015-02-21 MED ORDER — INFLUENZA VAC SPLIT QUAD 0.5 ML IM SUSY
0.5000 mL | PREFILLED_SYRINGE | INTRAMUSCULAR | Status: AC
  Administered 2015-02-22: 0.5 mL via INTRAMUSCULAR
  Filled 2015-02-21: qty 0.5

## 2015-02-21 MED ORDER — POLYETHYLENE GLYCOL 3350 17 G PO PACK: 17.0000 g | PACK | Freq: Once | ORAL | Status: DC

## 2015-02-21 MED ORDER — DOCUSATE SODIUM 100 MG PO CAPS: 100.0000 mg | ORAL_CAPSULE | Freq: Every day | ORAL | Status: DC | PRN

## 2015-02-21 MED ORDER — TAMSULOSIN HCL 0.4 MG PO CAPS
0.4000 mg | ORAL_CAPSULE | Freq: Every day | ORAL | Status: DC
  Administered 2015-02-21 – 2015-02-22 (×2): 0.4 mg via ORAL
  Filled 2015-02-21 (×2): qty 1

## 2015-02-21 MED ORDER — POLYETHYLENE GLYCOL 3350 17 G PO PACK: 17.0000 g | PACK | Freq: Every day | ORAL | Status: DC | PRN

## 2015-02-21 MED ORDER — DOCUSATE SODIUM 100 MG PO CAPS: 100.0000 mg | ORAL_CAPSULE | Freq: Two times a day (BID) | ORAL | Status: DC

## 2015-02-21 MED ORDER — BETHANECHOL CHLORIDE 25 MG PO TABS
25.0000 mg | ORAL_TABLET | Freq: Three times a day (TID) | ORAL | Status: DC
  Administered 2015-02-21 – 2015-02-22 (×5): 25 mg via ORAL
  Filled 2015-02-21 (×5): qty 1

## 2015-02-21 NOTE — Progress Notes (Signed)
Inpatient Diabetes Program Recommendations  AACE/ADA: New Consensus Statement on Inpatient Glycemic Control (2015)  Target Ranges:  Prepandial:   less than 140 mg/dL      Peak postprandial:   less than 180 mg/dL (1-2 hours)      Critically ill patients:  140 - 180 mg/dL   Review of Glycemic Control  Current orders for Inpatient glycemic control: None  Inpatient Diabetes Program Recommendations: Correction (SSI): Lab glucose this am was 263. Please consider while inpatient ordering Novolog Sensitive + HS scale. HgbA1C: A1c 6.7% on 02/12/15. Elevated.  Thanks,  Tama Headings RN, MSN, [facility] Inpatient Diabetes Coordinator Team Pager [phone] (8a-5p)

## 2015-02-21 NOTE — Progress Notes (Signed)
Md on call notified that pt was having pressure and post void bladder scan 691. New order for bladder scan and 675 mls emptied.Pt stated relieve. Will continue to monitor. Arthor Captain LPN

## 2015-02-21 NOTE — Progress Notes (Signed)
2 Days Post-Op  Subjective: Struggled with urinary retention overnight requiring I&O cath. Not taing a lot of PO but passing gas  Objective: Vital signs in last 24 hours: Temp:  [97.6 F (36.4 C)-98.4 F (36.9 C)] 98.1 F (36.7 C) (11/23 0534) Pulse Rate:  [53-61] 53 (11/23 0534) Resp:  [18] 18 (11/23 0534) BP: (121-138)/(51-56) 121/56 mmHg (11/23 0534) SpO2:  [95 %-100 %] 95 % (11/23 0534)    Intake/Output from previous day: 11/22 0701 - 11/23 0700 In: 900 [P.O.:900] Out: 1500 [Urine:1500] Intake/Output this shift: Total I/O In: -  Out: 1025 [Urine:1025]  General appearance: alert and cooperative Resp: clear to auscultation bilaterally GI: soft, incisions CDI, +BS  Lab Results:   Recent Labs  02/20/15 0349 02/21/15 0409  WBC 11.4* 8.8  HGB 12.6 11.6*  HCT 39.5 34.7*  PLT 194 161   BMET  Recent Labs  02/20/15 0349 02/21/15 0409  NA 133* 134*  K 4.4 4.8  CL 101 102  CO2 24 25  GLUCOSE 160* 263*  BUN 15 11  CREATININE 0.97 0.89  CALCIUM 8.6* 8.2*   PT/INR No results for input(s): LABPROT, INR in the last 72 hours. ABG No results for input(s): PHART, HCO3 in the last 72 hours.  Invalid input(s): PCO2, PO2  Studies/Results: No results found.  Anti-infectives: Anti-infectives    Start     Dose/Rate Route Frequency Ordered Stop   02/19/15 1930  clindamycin (CLEOCIN) IVPB 900 mg     900 mg 100 mL/hr over 30 Minutes Intravenous 3 times per day 02/19/15 1534 02/20/15 0641   02/19/15 1515  ciprofloxacin (CIPRO) IVPB 400 mg  Status:  Discontinued     400 mg 200 mL/hr over 60 Minutes Intravenous Every 12 hours 02/19/15 1510 02/19/15 1533   02/19/15 1119  gentamicin (GARAMYCIN) 380 mg in dextrose 5 % 100 mL IVPB     5 mg/kg  76.4 kg (Adjusted) 109.5 mL/hr over 60 Minutes Intravenous 60 min pre-op 02/19/15 1120 02/19/15 1205   02/19/15 1117  clindamycin (CLEOCIN) IVPB 900 mg     900 mg 100 mL/hr over 30 Minutes Intravenous 60 min pre-op 02/19/15 1117  02/19/15 1210   02/19/15 1117  gentamicin (GARAMYCIN) 560 mg in dextrose 5 % 100 mL IVPB  Status:  Discontinued     5 mg/kg  112.4 kg 114 mL/hr over 60 Minutes Intravenous 60 min pre-op 02/19/15 1117 02/19/15 1120   02/19/15 0913  neomycin (MYCIFRADIN) tablet 1,000 mg  Status:  Discontinued     1,000 mg Oral 3 times per day 02/19/15 0913 02/19/15 1455   02/19/15 0913  erythromycin (E-MYCIN) tablet 1,000 mg  Status:  Discontinued     1,000 mg Oral 3 times per day 02/19/15 0913 02/19/15 1455      Assessment/Plan: s/p Procedure(s): EXCISION OF MESENTERIC MASS (N/A)  LOS: 2 days  POD#2 Urinary retention - urecholine, flomax, I&O PRN [facility] to D/C 11/24  Onnika Siebel E 02/21/2015

## 2015-02-22 DIAGNOSIS — K388 Other specified diseases of appendix: Secondary | ICD-10-CM | POA: Diagnosis not present

## 2015-02-22 LAB — BASIC METABOLIC PANEL
Anion gap: 7 (ref 5–15)
BUN: 11 mg/dL (ref 6–20)
CHLORIDE: 100 mmol/L — AB (ref 101–111)
CO2: 28 mmol/L (ref 22–32)
CREATININE: 0.82 mg/dL (ref 0.44–1.00)
Calcium: 8.4 mg/dL — ABNORMAL LOW (ref 8.9–10.3)
GFR calc Af Amer: >60 mL/min (ref >60)
GFR calc non Af Amer: >60 mL/min (ref >60)
GLUCOSE: 145 mg/dL — AB (ref 65–99)
Potassium: 3.8 mmol/L (ref 3.5–5.1)
Sodium: 135 mmol/L (ref 135–145)

## 2015-02-22 LAB — CBC
HEMATOCRIT: 37.1 % (ref 36.0–46.0)
HEMOGLOBIN: 11.8 g/dL — AB (ref 12.0–15.0)
MCH: 30.3 pg (ref 26.0–34.0)
MCHC: 31.8 g/dL (ref 30.0–36.0)
MCV: 95.1 fL (ref 78.0–100.0)
Platelets: 163 10*3/uL (ref 150–400)
RBC: 3.9 MIL/uL (ref 3.87–5.11)
RDW: 13.8 % (ref 11.5–15.5)
WBC: 7.4 10*3/uL (ref 4.0–10.5)

## 2015-02-22 MED ORDER — BETHANECHOL CHLORIDE 25 MG PO TABS: 25.0000 mg | ORAL_TABLET | Freq: Three times a day (TID) | ORAL | Status: DC

## 2015-02-22 MED ORDER — OXYCODONE-ACETAMINOPHEN 5-325 MG PO TABS: 1.0000 | ORAL_TABLET | ORAL | Status: DC | PRN

## 2015-02-22 NOTE — Progress Notes (Signed)
AVS  discharge instructions were reviewed with patient and her daughter. Patient was given Oxycodone-acetaminophen prescription to take to her pharmacy.  Patient and her daughter did not have any questions. After taking out foley catheter pt voided a total of 500 cc and bladder scan for 135 cc. Patient also ambulated once down the hall about 100 ft.  Staff assisted patient to her transportation.

## 2015-02-22 NOTE — Progress Notes (Signed)
3 Days Post-Op  Subjective: No complaints. Wants to go home  Objective: Vital signs in last 24 hours: Temp:  [97.8 F (36.6 C)-98.4 F (36.9 C)] 97.8 F (36.6 C) (11/24 0457) Pulse Rate:  [51-55] 51 (11/24 0457) Resp:  [17-19] 19 (11/24 0457) BP: (102-126)/(42-45) 106/45 mmHg (11/24 0457) SpO2:  [94 %-96 %] 95 % (11/24 0457) Weight:  [118.3 kg (260 lb 12.9 oz)-118.389 kg (261 lb)] 118.3 kg (260 lb 12.9 oz) (11/24 0457) Last BM Date: 02/21/15  Intake/Output from previous day: 11/23 0701 - 11/24 0700 In: 4044.2 [P.O.:1230; I.V.:2664.2; IV Piggyback:150] Out: 3000 [Urine:3000] Intake/Output this shift: Total I/O In: 120 [P.O.:120] Out: -   Resp: clear to auscultation bilaterally Cardio: regular rate and rhythm GI: soft, mild tenderness. incision looks good  Lab Results:   Recent Labs  02/21/15 0409 02/22/15 0211  WBC 8.8 7.4  HGB 11.6* 11.8*  HCT 34.7* 37.1  PLT 161 163   BMET  Recent Labs  02/21/15 0409 02/22/15 0211  NA 134* 135  K 4.8 3.8  CL 102 100*  CO2 25 28  GLUCOSE 263* 145*  BUN 11 11  CREATININE 0.89 0.82  CALCIUM 8.2* 8.4*   PT/INR No results for input(s): LABPROT, INR in the last 72 hours. ABG No results for input(s): PHART, HCO3 in the last 72 hours.  Invalid input(s): PCO2, PO2  Studies/Results: No results found.  Anti-infectives: Anti-infectives    Start     Dose/Rate Route Frequency Ordered Stop   02/19/15 1930  clindamycin (CLEOCIN) IVPB 900 mg     900 mg 100 mL/hr over 30 Minutes Intravenous 3 times per day 02/19/15 1534 02/20/15 0641   02/19/15 1515  ciprofloxacin (CIPRO) IVPB 400 mg  Status:  Discontinued     400 mg 200 mL/hr over 60 Minutes Intravenous Every 12 hours 02/19/15 1510 02/19/15 1533   02/19/15 1119  gentamicin (GARAMYCIN) 380 mg in dextrose 5 % 100 mL IVPB     5 mg/kg  76.4 kg (Adjusted) 109.5 mL/hr over 60 Minutes Intravenous 60 min pre-op 02/19/15 1120 02/19/15 1205   02/19/15 1117  clindamycin (CLEOCIN)  IVPB 900 mg     900 mg 100 mL/hr over 30 Minutes Intravenous 60 min pre-op 02/19/15 1117 02/19/15 1210   02/19/15 1117  gentamicin (GARAMYCIN) 560 mg in dextrose 5 % 100 mL IVPB  Status:  Discontinued     5 mg/kg  112.4 kg 114 mL/hr over 60 Minutes Intravenous 60 min pre-op 02/19/15 1117 02/19/15 1120   02/19/15 0913  neomycin (MYCIFRADIN) tablet 1,000 mg  Status:  Discontinued     1,000 mg Oral 3 times per day 02/19/15 0913 02/19/15 1455   02/19/15 0913  erythromycin (E-MYCIN) tablet 1,000 mg  Status:  Discontinued     1,000 mg Oral 3 times per day 02/19/15 0913 02/19/15 1455      Assessment/Plan: s/p Procedure(s): EXCISION OF MESENTERIC MASS (N/A) Advance diet  D/C foley If she can urinate and walk in the halls then she may be able to go home later today  LOS: 3 days    TOTH III,Kally Cadden S 02/22/2015

## 2015-03-12 ENCOUNTER — Encounter (HOSPITAL_COMMUNITY): Payer: Self-pay | Admitting: Nurse Practitioner

## 2015-03-12 ENCOUNTER — Emergency Department (HOSPITAL_COMMUNITY)
Admission: EM | Admit: 2015-03-12 | Discharge: 2015-03-13 | Disposition: A | Discharge status: Home / Self Care | Payer: Medicare Other | Attending: Emergency Medicine | Admitting: Emergency Medicine

## 2015-03-12 ENCOUNTER — Emergency Department (HOSPITAL_COMMUNITY): Payer: Medicare Other

## 2015-03-12 DIAGNOSIS — Z7982 Long term (current) use of aspirin: Secondary | ICD-10-CM | POA: Diagnosis not present

## 2015-03-12 DIAGNOSIS — F329 Major depressive disorder, single episode, unspecified: Secondary | ICD-10-CM | POA: Diagnosis not present

## 2015-03-12 DIAGNOSIS — M810 Age-related osteoporosis without current pathological fracture: Secondary | ICD-10-CM | POA: Diagnosis not present

## 2015-03-12 DIAGNOSIS — Z8701 Personal history of pneumonia (recurrent): Secondary | ICD-10-CM | POA: Diagnosis not present

## 2015-03-12 DIAGNOSIS — J45909 Unspecified asthma, uncomplicated: Secondary | ICD-10-CM | POA: Insufficient documentation

## 2015-03-12 DIAGNOSIS — R1084 Generalized abdominal pain: Secondary | ICD-10-CM | POA: Diagnosis not present

## 2015-03-12 DIAGNOSIS — Z9889 Other specified postprocedural states: Secondary | ICD-10-CM | POA: Diagnosis not present

## 2015-03-12 DIAGNOSIS — K219 Gastro-esophageal reflux disease without esophagitis: Secondary | ICD-10-CM | POA: Diagnosis not present

## 2015-03-12 DIAGNOSIS — Z79899 Other long term (current) drug therapy: Secondary | ICD-10-CM | POA: Diagnosis not present

## 2015-03-12 DIAGNOSIS — I509 Heart failure, unspecified: Secondary | ICD-10-CM | POA: Insufficient documentation

## 2015-03-12 DIAGNOSIS — Z880 Allergy status to penicillin: Secondary | ICD-10-CM | POA: Diagnosis not present

## 2015-03-12 DIAGNOSIS — R52 Pain, unspecified: Secondary | ICD-10-CM | POA: Diagnosis present

## 2015-03-12 DIAGNOSIS — Z7951 Long term (current) use of inhaled steroids: Secondary | ICD-10-CM | POA: Insufficient documentation

## 2015-03-12 DIAGNOSIS — J449 Chronic obstructive pulmonary disease, unspecified: Secondary | ICD-10-CM | POA: Insufficient documentation

## 2015-03-12 DIAGNOSIS — F17210 Nicotine dependence, cigarettes, uncomplicated: Secondary | ICD-10-CM | POA: Insufficient documentation

## 2015-03-12 DIAGNOSIS — M1990 Unspecified osteoarthritis, unspecified site: Secondary | ICD-10-CM | POA: Diagnosis not present

## 2015-03-12 DIAGNOSIS — E860 Dehydration: Secondary | ICD-10-CM | POA: Diagnosis not present

## 2015-03-12 DIAGNOSIS — I10 Essential (primary) hypertension: Secondary | ICD-10-CM | POA: Diagnosis not present

## 2015-03-12 DIAGNOSIS — I209 Angina pectoris, unspecified: Secondary | ICD-10-CM | POA: Diagnosis not present

## 2015-03-12 DIAGNOSIS — R197 Diarrhea, unspecified: Secondary | ICD-10-CM

## 2015-03-12 LAB — URINALYSIS, ROUTINE W REFLEX MICROSCOPIC
Glucose, UA: NEGATIVE mg/dL
Hgb urine dipstick: NEGATIVE
Ketones, ur: NEGATIVE mg/dL
LEUKOCYTES UA: NEGATIVE
NITRITE: NEGATIVE
Protein, ur: NEGATIVE mg/dL
SPECIFIC GRAVITY, URINE: 1.031 — AB (ref 1.005–1.030)
pH: 5 (ref 5.0–8.0)

## 2015-03-12 LAB — COMPREHENSIVE METABOLIC PANEL
ALT: 18 U/L (ref 14–54)
AST: 20 U/L (ref 15–41)
Albumin: 4 g/dL (ref 3.5–5.0)
Alkaline Phosphatase: 80 U/L (ref 38–126)
Anion gap: 9 (ref 5–15)
BILIRUBIN TOTAL: 0.6 mg/dL (ref 0.3–1.2)
BUN: 27 mg/dL — ABNORMAL HIGH (ref 6–20)
CHLORIDE: 104 mmol/L (ref 101–111)
CO2: 26 mmol/L (ref 22–32)
CREATININE: 0.96 mg/dL (ref 0.44–1.00)
Calcium: 9.6 mg/dL (ref 8.9–10.3)
GFR, EST NON AFRICAN AMERICAN: 55 mL/min — AB (ref >60)
Glucose, Bld: 116 mg/dL — ABNORMAL HIGH (ref 65–99)
POTASSIUM: 4.3 mmol/L (ref 3.5–5.1)
Sodium: 139 mmol/L (ref 135–145)
TOTAL PROTEIN: 7.5 g/dL (ref 6.5–8.1)

## 2015-03-12 LAB — CBC
HCT: 41.1 % (ref 36.0–46.0)
Hemoglobin: 13.2 g/dL (ref 12.0–15.0)
MCH: 30.5 pg (ref 26.0–34.0)
MCHC: 32.1 g/dL (ref 30.0–36.0)
MCV: 94.9 fL (ref 78.0–100.0)
Platelets: 214 10*3/uL (ref 150–400)
RBC: 4.33 MIL/uL (ref 3.87–5.11)
RDW: 13.6 % (ref 11.5–15.5)
WBC: 8.1 10*3/uL (ref 4.0–10.5)

## 2015-03-12 LAB — I-STAT TROPONIN, ED: TROPONIN I, POC: 0 ng/mL (ref 0.00–0.08)

## 2015-03-12 LAB — I-STAT CG4 LACTIC ACID, ED: LACTIC ACID, VENOUS: 1.6 mmol/L (ref 0.5–2.0)

## 2015-03-12 MED ORDER — ACETAMINOPHEN 325 MG PO TABS
650.0000 mg | ORAL_TABLET | Freq: Once | ORAL | Status: AC
  Administered 2015-03-12: 650 mg via ORAL
  Filled 2015-03-12: qty 2

## 2015-03-12 MED ORDER — SODIUM CHLORIDE 0.9 % IV BOLUS (SEPSIS)
1000.0000 mL | Freq: Once | INTRAVENOUS | Status: AC
  Administered 2015-03-12: 1000 mL via INTRAVENOUS

## 2015-03-12 NOTE — ED Notes (Signed)
Patient transported to X-ray 

## 2015-03-12 NOTE — ED Notes (Signed)
Pt c/o 3 week history of body aches, headaches, diarrhea, urinary hesitancy. She was discharged from hospital after abd surgery November 24 and has not felt well since. She was treated for UTI with abx last week. She called her PCP and they wanted her to come here for evaluation of possible deydration

## 2015-03-12 NOTE — ED Provider Notes (Addendum)
CSN: [id number]     Arrival date & time 03/12/15  1614 History   First MD Initiated Contact with Patient 03/12/15 2119     Chief Complaint  Patient presents with  . Generalized Body Aches     (Consider location/radiation/quality/duration/timing/severity/associated sxs/prior Treatment) HPI Comments: Patient is a 81 year old female with a history of COPD, CHF, hypertension, GERD who recently underwent surgery at the end of November for removal of the mesenteric mass presents today with 2 weeks of worsening generalized body pain, nausea, headache and diarrhea for the last 1 week.  Patient states a little over a week ago she was seen at urgent care and diagnosed with urinary tract infection. She initially was started on Cipro which she was not tolerating as it was causing abdominal pain and diarrhea. That was stopped and she has been on Macrobid for the last 5 days. She continues to have diarrhea between 5-10 episodes daily which is watery associated with abdominal cramping. She has had some nausea but no vomiting. She has been eating food without difficulty. For the last 2-3 days she's kind of had a diffuse abdominal discomfort radiating up into the chest. She saw her surgeon today for follow-up and they recommended she come here for further evaluation.  The history is provided by the patient.    Past Medical History  Diagnosis Date  . COPD (chronic obstructive pulmonary disease) ([place])   . CHF (congestive heart failure) ([place])   . Arthritis   . Asthma   . Osteoporosis   . PONV (postoperative nausea and vomiting)   . Anginal pain ([place])   . Hypertension   . Shortness of breath dyspnea     with exertion  . Depression   . GERD (gastroesophageal reflux disease)   . Pneumonia 2013  . History of hiatal hernia   . Headache    Past Surgical History  Procedure Laterality Date  . Replacement total knee bilateral    . Foot surgery    . Appendectomy    . Abdominal hysterectomy    .  Cholecystectomy    . Cataract extraction w/ intraocular lens  implant, bilateral Bilateral 15 yrs ago  . Breast surgery  40 yrs. ago    growth removed in each breast-benign  . Cardiac catheterization      12/14/12 LHC ([facility]): few mild stenotic areas max 40% of ectatic RCA o/w NL coronaries, EF 65%. Med tx.  . Laparoscopic appendectomy N/A 02/19/2015    Procedure: EXCISION OF MESENTERIC MASS;  Surgeon: Stark Klein, MD;  Location: [facility] OR;  Service: General;  Laterality: N/A;   Family History  Problem Relation Age of Onset  . Family history unknown: Yes   Social History  Substance Use Topics  . Smoking status: Current Every Day Smoker -- 0.25 packs/day    Types: Cigarettes  . Smokeless tobacco: Never Used  . Alcohol Use: No   OB History    No data available     Review of Systems  All other systems reviewed and are negative.     Allergies  Codeine; Penicillins; and Tape  Home Medications   Prior to Admission medications   Medication Sig Start Date End Date Taking? Authorizing Provider  albuterol (PROVENTIL HFA;VENTOLIN HFA) 108 (90 BASE) MCG/ACT inhaler Inhale 2 puffs into the lungs every 6 (six) hours as needed.   Yes Historical Provider, MD  aspirin 81 MG tablet Take 81 mg by mouth daily.   Yes Historical Provider, MD  budesonide-formoterol (SYMBICORT) 160-4.5 MCG/ACT  inhaler Inhale 2 puffs into the lungs 2 (two) times daily.   Yes Historical Provider, MD  citalopram (CELEXA) 20 MG tablet Take 30 mg by mouth daily. 11/12/14  Yes Historical Provider, MD  clonazePAM (KLONOPIN) 1 MG tablet Take 1 mg by mouth daily. 11/20/14  Yes Historical Provider, MD  furosemide (LASIX) 20 MG tablet Take 20-40 mg by mouth daily. Take 2 tablets in the morning, and 1 tablet in the evening 11/20/14  Yes Historical Provider, MD  metoprolol succinate (TOPROL-XL) 25 MG 24 hr tablet Take 25 mg by mouth daily. 11/20/14  Yes Historical Provider, MD  pantoprazole (PROTONIX) 40 MG tablet Take 40 mg by mouth 2  (two) times daily. 11/20/14  Yes Historical Provider, MD  potassium chloride (MICRO-K) 10 MEQ CR capsule Take 10 mEq by mouth daily. 11/20/14  Yes Historical Provider, MD  rosuvastatin (CRESTOR) 40 MG tablet Take 40 mg by mouth daily.   Yes Historical Provider, MD  Vitamin D, Ergocalciferol, (DRISDOL) 50000 UNITS CAPS capsule Take 50,000 Units by mouth every 7 (seven) days. No specific days   Yes Historical Provider, MD  bethanechol (URECHOLINE) 25 MG tablet Take 1 tablet (25 mg total) by mouth 3 (three) times daily. Patient not taking: Reported on 03/12/2015 02/22/15   Autumn Messing III, MD  oxyCODONE (OXY IR/ROXICODONE) 5 MG immediate release tablet Take 1 tablet (5 mg total) by mouth every 4 (four) hours as needed for moderate pain. Patient not taking: Reported on 03/12/2015 02/20/15   Stark Klein, MD  oxyCODONE-acetaminophen (ROXICET) 5-325 MG tablet Take 1-2 tablets by mouth every 4 (four) hours as needed. Patient not taking: Reported on 03/12/2015 02/22/15   Autumn Messing III, MD   BP 127/107 mmHg  Pulse 67  Temp(Src) 98.6 F (37 C) (Oral)  Resp 20  SpO2 97% Physical Exam  Constitutional: She is oriented to person, place, and time. She appears well-developed and well-nourished. No distress.  HENT:  Head: Normocephalic and atraumatic.  Mouth/Throat: Oropharynx is clear and moist. Mucous membranes are dry.  Eyes: Conjunctivae and EOM are normal. Pupils are equal, round, and reactive to light.  Neck: Normal range of motion. Neck supple.  Cardiovascular: Normal rate, regular rhythm and intact distal pulses.   No murmur heard. Pulmonary/Chest: Effort normal and breath sounds normal. No respiratory distress. She has no wheezes. She has no rales.  Abdominal: Soft. Bowel sounds are normal. She exhibits no distension. There is tenderness. There is no rebound and no guarding.    Mild diffuse tenderness with palpation  Musculoskeletal: Normal range of motion. She exhibits no edema or tenderness.    Neurological: She is alert and oriented to person, place, and time.  Skin: Skin is warm and dry. No rash noted. No erythema.  Psychiatric: She has a normal mood and affect. Her behavior is normal.  Nursing note and vitals reviewed.   ED Course  Procedures (including critical care time) Labs Review Labs Reviewed  COMPREHENSIVE METABOLIC PANEL - Abnormal; Notable for the following:    Glucose, Bld 116 (*)    BUN 27 (*)    GFR calc non Af Amer 55 (*)    All other components within normal limits  URINALYSIS, ROUTINE W REFLEX MICROSCOPIC (NOT AT [facility]) - Abnormal; Notable for the following:    Color, Urine AMBER (*)    Specific Gravity, Urine 1.031 (*)    Bilirubin Urine SMALL (*)    All other components within normal limits  C DIFFICILE QUICK SCREEN W PCR REFLEX  CBC  I-STAT CG4 LACTIC ACID, ED  I-STAT TROPOININ, ED    Imaging Review Dg Abd Acute W/chest  03/12/2015  CLINICAL DATA:  Subacute onset of body aches, headache, diarrhea and urinary hesitancy. Initial encounter. EXAM: DG ABDOMEN ACUTE W/ 1V CHEST COMPARISON:  CT of the abdomen and pelvis performed 12/15/2014, and chest radiograph performed 12/19/2013 FINDINGS: The lungs are well-aerated. Mild bibasilar atelectasis is noted. There is no evidence of pleural effusion or pneumothorax. The cardiomediastinal silhouette is borderline normal in size. The visualized bowel gas pattern is unremarkable. Scattered stool and air are seen within the colon; there is no evidence of small bowel dilatation to suggest obstruction. No free intra-abdominal air is identified on the provided upright view. No acute osseous abnormalities are seen; the sacroiliac joints are unremarkable in appearance. IMPRESSION: 1. Unremarkable bowel gas pattern; no free intra-abdominal air seen. Small amount of stool noted in the colon. 2. Mild bibasilar atelectasis noted.  Lungs otherwise clear. Electronically Signed   By: Garald Balding M.D.   On: 03/12/2015 22:33    I have personally reviewed and evaluated these images and lab results as part of my medical decision-making.   EKG Interpretation   Date/Time:  Monday March 12 2015 23:02:57 EST Ventricular Rate:  60 PR Interval:  214 QRS Duration: 103 QT Interval:  438 QTC Calculation: 438 R Axis:   34 Text Interpretation:  Sinus rhythm Borderline prolonged PR interval  Abnormal R-wave progression, early transition No significant change since  last tracing Confirmed by Maryan Rued  MD, Loree Fee ([id number]) on 03/12/2015  11:32:54 PM      MDM   Final diagnoses:  Dehydration  Diarrhea, unspecified type    Patient is a 81 year old female with a history of recent abdominal surgery with removal of the mesenteric mass who was discharged at the end of November and last week was diagnosed with a urinary tract infection. At that time she was started on Cipro which she took for 2 days but was not tolerating it and then started on Macrobid 5 days ago.  Since that time patient has had persistent diarrhea for the last 6 days 5-10 episodes per day with nausea but no vomiting. No fever. She's having diffuse body aches as well as upper abdominal and chest pain.  On exam patient has mild diffuse abdominal tenderness but surgical site healing well. Vital signs are within normal limits. Labs show elevated BUN which is most likely prerenal from dehydration as she is not taking by mouth's as well as with taking Lasix and having diarrhea she is most likely dehydrated. UA without signs of UTI. Lactic acid is within normal limits. CBC within normal hemoglobin and white count.  Will get EKG and acute abdominal series to ensure no other acute findings. Feel most likely patient is having diarrhea as a side effect of the ongoing antibiotic use. A C. difficile PCR was ordered if patient produces any stool.  Will start patient on IV fluids. Also will start her on a probiotic and recommend that she stop the antibiotics.  12:14 AM   EKG, acute abdominal series, troponin and lactic acid are all within normal limits.  Patient feeling better after fluids. Unable to produce a stool sample here but was sent home with a kit that she can take to her PCP.  Blanchie Dessert, MD 03/13/15 [id number]  Blanchie Dessert, MD 03/13/15 [id number]

## 2015-03-13 DIAGNOSIS — E860 Dehydration: Secondary | ICD-10-CM | POA: Diagnosis not present

## 2015-03-13 LAB — C DIFFICILE QUICK SCREEN W PCR REFLEX
C DIFFICILE (CDIFF) TOXIN: NEGATIVE
C DIFFICLE (CDIFF) ANTIGEN: POSITIVE — AB

## 2015-03-13 MED ORDER — ACIDOPHILUS PROBIOTIC 100 MG PO CAPS: 1.0000 | ORAL_CAPSULE | Freq: Every day | ORAL | Status: DC

## 2015-03-13 NOTE — ED Notes (Signed)
Pt departed in a wheelchair, assisted by Karrie Doffing, RN, and in NAD.

## 2015-08-14 DIAGNOSIS — M159 Polyosteoarthritis, unspecified: Secondary | ICD-10-CM | POA: Insufficient documentation

## 2015-08-14 DIAGNOSIS — M51369 Other intervertebral disc degeneration, lumbar region without mention of lumbar back pain or lower extremity pain: Secondary | ICD-10-CM | POA: Insufficient documentation

## 2015-08-14 DIAGNOSIS — R5383 Other fatigue: Secondary | ICD-10-CM | POA: Insufficient documentation

## 2015-08-14 DIAGNOSIS — N183 Chronic kidney disease, stage 3 unspecified: Secondary | ICD-10-CM

## 2015-08-14 DIAGNOSIS — M5136 Other intervertebral disc degeneration, lumbar region: Secondary | ICD-10-CM | POA: Insufficient documentation

## 2015-08-14 DIAGNOSIS — R5381 Other malaise: Secondary | ICD-10-CM

## 2015-08-14 DIAGNOSIS — E1169 Type 2 diabetes mellitus with other specified complication: Secondary | ICD-10-CM | POA: Insufficient documentation

## 2015-08-14 DIAGNOSIS — M150 Primary generalized (osteo)arthritis: Secondary | ICD-10-CM

## 2015-08-14 DIAGNOSIS — R269 Unspecified abnormalities of gait and mobility: Secondary | ICD-10-CM | POA: Insufficient documentation

## 2015-08-14 DIAGNOSIS — E1122 Type 2 diabetes mellitus with diabetic chronic kidney disease: Secondary | ICD-10-CM

## 2015-08-14 HISTORY — DX: Type 2 diabetes mellitus with diabetic chronic kidney disease: E11.22

## 2015-08-14 HISTORY — DX: Chronic kidney disease, stage 3 unspecified: N18.30

## 2015-08-14 HISTORY — DX: Primary generalized (osteo)arthritis: M15.0

## 2015-08-14 HISTORY — DX: Other malaise: R53.81

## 2015-10-24 DIAGNOSIS — F5104 Psychophysiologic insomnia: Secondary | ICD-10-CM

## 2015-10-24 HISTORY — DX: Psychophysiologic insomnia: F51.04

## 2015-11-08 ENCOUNTER — Other Ambulatory Visit: Payer: Self-pay | Admitting: General Surgery

## 2015-11-08 DIAGNOSIS — R109 Unspecified abdominal pain: Secondary | ICD-10-CM

## 2015-11-09 ENCOUNTER — Emergency Department (HOSPITAL_COMMUNITY): Payer: Medicare Other

## 2015-11-09 ENCOUNTER — Observation Stay (HOSPITAL_COMMUNITY)
Admission: EM | Admit: 2015-11-09 | Discharge: 2015-11-11 | Disposition: A | Discharge status: Home / Self Care | Payer: Medicare Other | Attending: Internal Medicine | Admitting: Internal Medicine

## 2015-11-09 ENCOUNTER — Encounter (HOSPITAL_COMMUNITY): Payer: Self-pay | Admitting: Emergency Medicine

## 2015-11-09 DIAGNOSIS — J449 Chronic obstructive pulmonary disease, unspecified: Secondary | ICD-10-CM | POA: Insufficient documentation

## 2015-11-09 DIAGNOSIS — E785 Hyperlipidemia, unspecified: Secondary | ICD-10-CM | POA: Insufficient documentation

## 2015-11-09 DIAGNOSIS — M1990 Unspecified osteoarthritis, unspecified site: Secondary | ICD-10-CM | POA: Diagnosis not present

## 2015-11-09 DIAGNOSIS — R739 Hyperglycemia, unspecified: Secondary | ICD-10-CM

## 2015-11-09 DIAGNOSIS — J431 Panlobular emphysema: Secondary | ICD-10-CM

## 2015-11-09 DIAGNOSIS — F419 Anxiety disorder, unspecified: Secondary | ICD-10-CM | POA: Diagnosis not present

## 2015-11-09 DIAGNOSIS — Z79899 Other long term (current) drug therapy: Secondary | ICD-10-CM | POA: Diagnosis not present

## 2015-11-09 DIAGNOSIS — Z9981 Dependence on supplemental oxygen: Secondary | ICD-10-CM | POA: Insufficient documentation

## 2015-11-09 DIAGNOSIS — Z7982 Long term (current) use of aspirin: Secondary | ICD-10-CM | POA: Insufficient documentation

## 2015-11-09 DIAGNOSIS — Z9049 Acquired absence of other specified parts of digestive tract: Secondary | ICD-10-CM | POA: Diagnosis not present

## 2015-11-09 DIAGNOSIS — R079 Chest pain, unspecified: Secondary | ICD-10-CM | POA: Diagnosis not present

## 2015-11-09 DIAGNOSIS — I5032 Chronic diastolic (congestive) heart failure: Secondary | ICD-10-CM

## 2015-11-09 DIAGNOSIS — F17210 Nicotine dependence, cigarettes, uncomplicated: Secondary | ICD-10-CM | POA: Diagnosis not present

## 2015-11-09 DIAGNOSIS — K219 Gastro-esophageal reflux disease without esophagitis: Secondary | ICD-10-CM | POA: Insufficient documentation

## 2015-11-09 DIAGNOSIS — M810 Age-related osteoporosis without current pathological fracture: Secondary | ICD-10-CM | POA: Insufficient documentation

## 2015-11-09 DIAGNOSIS — Z880 Allergy status to penicillin: Secondary | ICD-10-CM | POA: Insufficient documentation

## 2015-11-09 DIAGNOSIS — F329 Major depressive disorder, single episode, unspecified: Secondary | ICD-10-CM | POA: Diagnosis not present

## 2015-11-09 DIAGNOSIS — R0789 Other chest pain: Secondary | ICD-10-CM

## 2015-11-09 DIAGNOSIS — R072 Precordial pain: Principal | ICD-10-CM | POA: Insufficient documentation

## 2015-11-09 DIAGNOSIS — F32A Depression, unspecified: Secondary | ICD-10-CM

## 2015-11-09 DIAGNOSIS — J439 Emphysema, unspecified: Secondary | ICD-10-CM | POA: Diagnosis not present

## 2015-11-09 DIAGNOSIS — Z6841 Body Mass Index (BMI) 40.0 and over, adult: Secondary | ICD-10-CM | POA: Insufficient documentation

## 2015-11-09 DIAGNOSIS — E1165 Type 2 diabetes mellitus with hyperglycemia: Secondary | ICD-10-CM | POA: Diagnosis not present

## 2015-11-09 DIAGNOSIS — I110 Hypertensive heart disease with heart failure: Secondary | ICD-10-CM | POA: Insufficient documentation

## 2015-11-09 HISTORY — DX: Panlobular emphysema: J43.1

## 2015-11-09 HISTORY — DX: Hyperglycemia, unspecified: R73.9

## 2015-11-09 HISTORY — DX: Chest pain, unspecified: R07.9

## 2015-11-09 LAB — BASIC METABOLIC PANEL WITH GFR
Anion gap: 8 (ref 5–15)
BUN: 19 mg/dL (ref 6–20)
CO2: 26 mmol/L (ref 22–32)
Calcium: 9 mg/dL (ref 8.9–10.3)
Chloride: 104 mmol/L (ref 101–111)
Creatinine, Ser: 0.74 mg/dL (ref 0.44–1.00)
GFR calc Af Amer: >60 mL/min (ref >60)
GFR calc non Af Amer: >60 mL/min (ref >60)
Glucose, Bld: 163 mg/dL — ABNORMAL HIGH (ref 65–99)
Potassium: 3.7 mmol/L (ref 3.5–5.1)
Sodium: 138 mmol/L (ref 135–145)

## 2015-11-09 LAB — I-STAT TROPONIN, ED: TROPONIN I, POC: 0 ng/mL (ref 0.00–0.08)

## 2015-11-09 LAB — CBC
HCT: 40.2 % (ref 36.0–46.0)
Hemoglobin: 12.5 g/dL (ref 12.0–15.0)
MCH: 29.4 pg (ref 26.0–34.0)
MCHC: 31.1 g/dL (ref 30.0–36.0)
MCV: 94.6 fL (ref 78.0–100.0)
Platelets: 191 K/uL (ref 150–400)
RBC: 4.25 MIL/uL (ref 3.87–5.11)
RDW: 13.3 % (ref 11.5–15.5)
WBC: 5.1 K/uL (ref 4.0–10.5)

## 2015-11-09 LAB — TROPONIN I

## 2015-11-09 LAB — BRAIN NATRIURETIC PEPTIDE: B NATRIURETIC PEPTIDE 5: 106.3 pg/mL — AB (ref 0.0–100.0)

## 2015-11-09 MED ORDER — ASPIRIN EC 81 MG PO TBEC
81.0000 mg | DELAYED_RELEASE_TABLET | Freq: Every day | ORAL | Status: DC
  Administered 2015-11-10 – 2015-11-11 (×2): 81 mg via ORAL
  Filled 2015-11-09 (×2): qty 1

## 2015-11-09 MED ORDER — ALBUTEROL SULFATE (2.5 MG/3ML) 0.083% IN NEBU: 2.5000 mg | INHALATION_SOLUTION | Freq: Four times a day (QID) | RESPIRATORY_TRACT | Status: DC | PRN

## 2015-11-09 MED ORDER — ALBUTEROL SULFATE HFA 108 (90 BASE) MCG/ACT IN AERS: 2.0000 | INHALATION_SPRAY | Freq: Four times a day (QID) | RESPIRATORY_TRACT | Status: DC | PRN

## 2015-11-09 MED ORDER — ENOXAPARIN SODIUM 40 MG/0.4ML ~~LOC~~ SOLN
40.0000 mg | SUBCUTANEOUS | Status: DC
  Administered 2015-11-09 – 2015-11-10 (×2): 40 mg via SUBCUTANEOUS
  Filled 2015-11-09 (×2): qty 0.4

## 2015-11-09 MED ORDER — ROSUVASTATIN CALCIUM 40 MG PO TABS
40.0000 mg | ORAL_TABLET | Freq: Every day | ORAL | Status: DC
  Administered 2015-11-10 – 2015-11-11 (×2): 40 mg via ORAL
  Filled 2015-11-09 (×2): qty 1
  Filled 2015-11-09: qty 4
  Filled 2015-11-09: qty 1
  Filled 2015-11-09 (×2): qty 4

## 2015-11-09 MED ORDER — SODIUM CHLORIDE 0.9 % IV SOLN
INTRAVENOUS | Status: DC
  Administered 2015-11-09: 18:00:00 via INTRAVENOUS

## 2015-11-09 MED ORDER — CITALOPRAM HYDROBROMIDE 20 MG PO TABS
30.0000 mg | ORAL_TABLET | Freq: Every day | ORAL | Status: DC
  Administered 2015-11-10 – 2015-11-11 (×2): 30 mg via ORAL
  Filled 2015-11-09 (×2): qty 2

## 2015-11-09 MED ORDER — MOMETASONE FURO-FORMOTEROL FUM 200-5 MCG/ACT IN AERO
2.0000 | INHALATION_SPRAY | Freq: Two times a day (BID) | RESPIRATORY_TRACT | Status: DC
  Administered 2015-11-09 – 2015-11-11 (×3): 2 via RESPIRATORY_TRACT
  Filled 2015-11-09: qty 8.8

## 2015-11-09 MED ORDER — CITALOPRAM HYDROBROMIDE 20 MG PO TABS: 30.0000 mg | ORAL_TABLET | Freq: Every day | ORAL | Status: DC

## 2015-11-09 MED ORDER — ACETAMINOPHEN 325 MG PO TABS
650.0000 mg | ORAL_TABLET | ORAL | Status: DC | PRN
  Administered 2015-11-10 (×2): 650 mg via ORAL
  Filled 2015-11-09 (×2): qty 2

## 2015-11-09 MED ORDER — CLONAZEPAM 1 MG PO TABS
1.0000 mg | ORAL_TABLET | Freq: Every day | ORAL | Status: DC
  Administered 2015-11-09: 1 mg via ORAL
  Filled 2015-11-09: qty 1

## 2015-11-09 MED ORDER — GI COCKTAIL ~~LOC~~
30.0000 mL | Freq: Four times a day (QID) | ORAL | Status: DC | PRN
  Filled 2015-11-09: qty 30

## 2015-11-09 MED ORDER — ONDANSETRON HCL 4 MG/2ML IJ SOLN
4.0000 mg | Freq: Four times a day (QID) | INTRAMUSCULAR | Status: DC | PRN
  Administered 2015-11-11: 4 mg via INTRAVENOUS
  Filled 2015-11-09: qty 2

## 2015-11-09 NOTE — ED Notes (Signed)
Report given to 2W RN.  Patient stable for transport on telemetry at this time.

## 2015-11-09 NOTE — ED Triage Notes (Signed)
Pt from home via [place] with c/o sudden onset chest pain radiating to her left arm and epigastric area described as a pressure and squeezing with sudden onset headache at the same time.  Pt reports this is the second time it has happened this week.  Pt took 324 mg Asprin PTA pain decreased 10/10 to 3/10 and given 1 nitro with no change in pain by EMS.  NAD, A&O.

## 2015-11-09 NOTE — ED Notes (Signed)
Bedside commode at pt's bedside. 

## 2015-11-09 NOTE — Progress Notes (Signed)
Pt received on 2W.  RN and nurse tech at bedside.  Tele applied and CCMD notified.  Pt oriented to room, provided with menu and dinner ordered.  Pt assessed, no acute distress noted at this time. Family at bedside.

## 2015-11-09 NOTE — ED Provider Notes (Signed)
[facility] DEPT Provider Note   CSN: [id number] Arrival date & time: 11/09/15  1149  First Provider Contact:  First MD Initiated Contact with Patient 11/09/15 1228        History   Chief Complaint Chief Complaint  Patient presents with  . Chest Pain    HPI Danielle [name] is a 82 y.o. female.  The history is provided by the patient and medical records. No language interpreter was used.   Danielle [name] is a 82 y.o. female  with a PMH of HTN, CHF, HLD, COPD who presents to the Emergency Department complaining of acute onset of severe left sided chest pain which radiated to left shoulder lasting one minute. This sharp pain subsided, then patient felt a pressure-like pain across lower chest wall described as "a tight band squeezing me". 324 ASA with no relief. EMS gave 1 nitro which also provided no relief. Patient also endorses feeling very weak currently. Denies associated nausea, abdominal pain, shortness breath, diaphoresis. Patient endorses a similar episode of severe left-sided chest pain lasting about a minute on Monday. Patient states all her symptoms resolved after that minute, therefore she did not seek evaluation.  On 2 L O2 at home - only at night. Patient is a smoker. Mother died of MI at age 29, no other family cardiac hx. No hormonal replacement therapy.   Past Medical History:  Diagnosis Date  . Anginal pain ([place])   . Arthritis   . Asthma   . CHF (congestive heart failure) ([place])   . COPD (chronic obstructive pulmonary disease) ([place])   . Depression   . GERD (gastroesophageal reflux disease)   . Headache   . History of hiatal hernia   . Hypertension   . Osteoporosis   . Pneumonia 2013  . PONV (postoperative nausea and vomiting)   . Shortness of breath dyspnea    with exertion    Patient Active Problem List   Diagnosis Date Noted  . Chest pain 11/09/2015  . Depression 11/09/2015  . COPD (chronic obstructive pulmonary disease) with emphysema  ([place]) 11/09/2015  . Elevated blood sugar 11/09/2015  . Mesenteric mass 02/19/2015    Past Surgical History:  Procedure Laterality Date  . ABDOMINAL HYSTERECTOMY    . APPENDECTOMY    . BREAST SURGERY  40 yrs. ago   growth removed in each breast-benign  . CARDIAC CATHETERIZATION     12/14/12 LHC ([facility]): few mild stenotic areas max 40% of ectatic RCA o/w NL coronaries, EF 65%. Med tx.  Marland Kitchen CATARACT EXTRACTION W/ INTRAOCULAR LENS  IMPLANT, BILATERAL Bilateral 15 yrs ago  . CHOLECYSTECTOMY    . FOOT SURGERY    . LAPAROSCOPIC APPENDECTOMY N/A 02/19/2015   Procedure: EXCISION OF MESENTERIC MASS;  Surgeon: Stark Klein, MD;  Location: [facility];  Service: General;  Laterality: N/A;  . REPLACEMENT TOTAL KNEE BILATERAL      OB History    No data available       Home Medications    Prior to Admission medications   Medication Sig Start Date End Date Taking? Authorizing Provider  albuterol (PROVENTIL HFA;VENTOLIN HFA) 108 (90 BASE) MCG/ACT inhaler Inhale 2 puffs into the lungs every 6 (six) hours as needed.   Yes Historical Provider, MD  aspirin 81 MG tablet Take 81 mg by mouth daily.   Yes Historical Provider, MD  budesonide-formoterol (SYMBICORT) 160-4.5 MCG/ACT inhaler Inhale 2 puffs into the lungs 2 (two) times daily.   Yes Historical Provider, MD  citalopram (CELEXA)  20 MG tablet Take 30 mg by mouth daily. 11/12/14  Yes Historical Provider, MD  clonazePAM (KLONOPIN) 1 MG tablet Take 1 mg by mouth daily. 11/20/14  Yes Historical Provider, MD  furosemide (LASIX) 20 MG tablet Take 40 mg by mouth daily.  11/20/14  Yes Historical Provider, MD  metoprolol succinate (TOPROL-XL) 25 MG 24 hr tablet Take 25 mg by mouth daily. 11/20/14  Yes Historical Provider, MD  pantoprazole (PROTONIX) 40 MG tablet Take 40 mg by mouth 2 (two) times daily. 11/20/14  Yes Historical Provider, MD  potassium chloride (MICRO-K) 10 MEQ CR capsule Take 10 mEq by mouth daily. 11/20/14  Yes Historical Provider, MD  rosuvastatin  (CRESTOR) 40 MG tablet Take 40 mg by mouth daily.   Yes Historical Provider, MD  Vitamin D, Ergocalciferol, (DRISDOL) 50000 UNITS CAPS capsule Take 50,000 Units by mouth every 7 (seven) days. No specific days   Yes Historical Provider, MD  bethanechol (URECHOLINE) 25 MG tablet Take 1 tablet (25 mg total) by mouth 3 (three) times daily. Patient not taking: Reported on 03/12/2015 02/22/15   Autumn Messing III, MD  Lactobacillus (ACIDOPHILUS PROBIOTIC) 100 MG CAPS Take 1 capsule (100 mg total) by mouth daily. Patient not taking: Reported on 11/09/2015 03/13/15   Blanchie Dessert, MD    Family History Family History  Problem Relation Age of Onset  . Family history unknown: Yes    Social History Social History  Substance Use Topics  . Smoking status: Current Every Day Smoker    Packs/day: 0.25    Types: Cigarettes  . Smokeless tobacco: Never Used  . Alcohol use No     Allergies   Codeine; Penicillins; and Tape   Review of Systems Review of Systems  Constitutional: Negative for chills and fever.  HENT: Negative for congestion.   Eyes: Negative for visual disturbance.  Respiratory: Negative for cough and shortness of breath.   Cardiovascular: Positive for chest pain. Negative for palpitations and leg swelling.  Gastrointestinal: Negative for abdominal pain, nausea and vomiting.  Genitourinary: Negative for dysuria.  Musculoskeletal: Negative for back pain and neck pain.  Skin: Negative for rash.  Neurological: Negative for headaches.     Physical Exam Updated Vital Signs BP 102/89   Pulse (!) 57   Temp 97.7 F (36.5 C) (Oral)   Resp 13   Wt 112.9 kg   SpO2 97%   BMI 44.11 kg/m   Physical Exam  Constitutional: She is oriented to person, place, and time. She appears well-developed and well-nourished. No distress.  HENT:  Head: Normocephalic and atraumatic.  Cardiovascular: Normal rate, regular rhythm, normal heart sounds and intact distal pulses.  Exam reveals no gallop  and no friction rub.   No murmur heard. Pulmonary/Chest: Effort normal and breath sounds normal. No respiratory distress. She exhibits no tenderness.  Abdominal: Soft. Bowel sounds are normal. She exhibits no distension. There is no tenderness.  Musculoskeletal: She exhibits no edema.  Neurological: She is alert and oriented to person, place, and time.  Skin: Skin is warm and dry.  Nursing note and vitals reviewed.    ED Treatments / Results  Labs (all labs ordered are listed, but only abnormal results are displayed) Labs Reviewed  BASIC METABOLIC PANEL - Abnormal; Notable for the following:       Result Value   Glucose, Bld 163 (*)    All other components within normal limits  BRAIN NATRIURETIC PEPTIDE - Abnormal; Notable for the following:    B Natriuretic Peptide  106.3 (*)    All other components within normal limits  CBC  I-STAT TROPOININ, ED    EKG  EKG Interpretation  Date/Time:  Friday November 09 2015 11:53:58 EDT Ventricular Rate:  57 PR Interval:    QRS Duration: 103 QT Interval:  447 QTC Calculation: 436 R Axis:   -10 Text Interpretation:  Sinus rhythm Abnormal R-wave progression, early transition No significant change since last tracing Confirmed by ISAACS MD, Lysbeth Galas [phone]) on 11/09/2015 12:32:30 PM       Radiology Dg Chest 2 View  Result Date: 11/09/2015 CLINICAL DATA:  Monday patient reports pain across her chest and headache, this morning patient reports the same pain across her chest, headache, left shoulder pain. HX CHF, COPD, HTN controlled EXAM: CHEST  2 VIEW COMPARISON:  03/12/2015 FINDINGS: Heart size is normal. The lungs are free of focal consolidations and pleural effusions. Mild bibasilar atelectasis. Moderate mid thoracic spondylosis. IMPRESSION: No evidence for acute cardiopulmonary abnormality. Electronically Signed   By: Nolon Nations M.D.   On: 11/09/2015 13:45    Procedures Procedures (including critical care time)  Medications Ordered  in ED Medications  rosuvastatin (CRESTOR) tablet 40 mg (not administered)  albuterol (PROVENTIL HFA;VENTOLIN HFA) 108 (90 Base) MCG/ACT inhaler 2 puff (not administered)  aspirin EC tablet 81 mg (not administered)  mometasone-formoterol (DULERA) 200-5 MCG/ACT inhaler 2 puff (not administered)  citalopram (CELEXA) tablet 30 mg (not administered)  clonazePAM (KLONOPIN) tablet 1 mg (not administered)  0.9 %  sodium chloride infusion (not administered)  gi cocktail (Maalox,Lidocaine,Donnatal) (not administered)  acetaminophen (TYLENOL) tablet 650 mg (not administered)  ondansetron (ZOFRAN) injection 4 mg (not administered)  enoxaparin (LOVENOX) injection 40 mg (not administered)      Initial Impression / Assessment and Plan / ED Course  I have reviewed the triage vital signs and the nursing notes.  Pertinent labs & imaging results that were available during my care of the patient were reviewed by me and considered in my medical decision making (see chart for details).  Clinical Course   Danielle [name] is a 82 y.o. female who presents to ED for chest pain. Multiple risk factors for ACS and concerning story. Initial trop is negative, CXR with no acute findings, and EKG unchanged from previous, however given history and risk factors, I believe patient would benefit from admission for further evaluation.   Hospitalist consulted who will admit.   Patient seen by and discussed with Dr. Ellender Hose who agrees with treatment plan.   Final Clinical Impressions(s) / ED Diagnoses   Final diagnoses:  Chest pain, unspecified chest pain type    New Prescriptions New Prescriptions   No medications on file     [facility] Dorthia Tout, PA-C 11/09/15 [place], MD 11/11/15 [phone]

## 2015-11-09 NOTE — ED Notes (Signed)
Patient transported to X-ray 

## 2015-11-09 NOTE — ED Notes (Signed)
Pt stated that she was "having a hard time breathing". Pt's O2 percentage was at 93 without O2. Pt stated that she is "on 2L of O2 at night". Informed Levada Dy - RN. Pt was placed on 2L of O2.

## 2015-11-09 NOTE — ED Notes (Signed)
Admitting MD at the bedside.  

## 2015-11-09 NOTE — H&P (Signed)
History and Physical    Danielle [name] [id number] DOB: [birth year] DOA: 11/09/2015   PCP: Gilford Rile, MD   Patient coming from:  Home    Chief Complaint: Chest Pain  HPI: Danielle [name] is a 82 y.o. female with medical history significant for diastolic CHF, COPD on 2 l O2 at night, asthma/ emphysema, HLD, DJD,osteoarthritis, morbid obesity,  depression, brought by EMS [facility] to [facility] for evaluation of CP.  PAin began this morning, sudden, radiating to the left arm and epigastrium, along with a sudden onset of headache which has resolved by now. She took 324 mg ASA PTA, pain decreased from 10 to 3/10. At the ED received Nitroglycerin without significant relief. She denies however any furhter severe episodes since admissiom. She reports similar episodes over the last 2 weeks, did not seek medical attention. Denies diaphoresis or jaw pain. Reports occasional dizziness, no syncope or presyncope  Denies any fever or chills. Denies any nausea, vomiting or abdominal pain. Appetite is normal and eats salt rich foods. Denies any leg swelling or calf pain. Denies any headaches or vision changes. Denies any seizures or confusion. Last Cardiac cath was on 2014. Cardiologist is in [place].  No recent long distance trips. Denies any new stressors. No new meds. Not on hormonal therapy. No new herbal supplements.No ETOH . Continues to smoke 1/4 ppd   ED Course:  BP 102/89   Pulse (!) 57   Temp 97.7 F (36.5 C) (Oral)   Resp 13   Wt 112.9 kg (249 lb)   SpO2 97%   BMI 44.11 kg/m   EKG s ACS, Tn negative.BNP 106.3 Glu 163 CBC and CMET normal.   No evidence for acute cardiopulmonary abnormality.  Review of Systems: As per HPI otherwise 10 point review of systems negative.   Past Medical History:  Diagnosis Date  . Anginal pain ([place])   . Arthritis   . Asthma   . CHF (congestive heart failure) ([place])   . COPD (chronic obstructive pulmonary disease) ([place])   . Depression   .  GERD (gastroesophageal reflux disease)   . Headache   . History of hiatal hernia   . Hypertension   . Osteoporosis   . Pneumonia 2013  . PONV (postoperative nausea and vomiting)   . Shortness of breath dyspnea    with exertion    Past Surgical History:  Procedure Laterality Date  . ABDOMINAL HYSTERECTOMY    . APPENDECTOMY    . BREAST SURGERY  40 yrs. ago   growth removed in each breast-benign  . CARDIAC CATHETERIZATION     12/14/12 LHC ([facility]): few mild stenotic areas max 40% of ectatic RCA o/w NL coronaries, EF 65%. Med tx.  Marland Kitchen CATARACT EXTRACTION W/ INTRAOCULAR LENS  IMPLANT, BILATERAL Bilateral 15 yrs ago  . CHOLECYSTECTOMY    . FOOT SURGERY    . LAPAROSCOPIC APPENDECTOMY N/A 02/19/2015   Procedure: EXCISION OF MESENTERIC MASS;  Surgeon: Stark Klein, MD;  Location: [facility];  Service: General;  Laterality: N/A;  . REPLACEMENT TOTAL KNEE BILATERAL      Social History Social History   Social History  . Marital status: Married    Spouse name: N/A  . Number of children: N/A  . Years of education: N/A   Occupational History  . Not on file.   Social History Main Topics  . Smoking status: Current Every Day Smoker    Packs/day: 0.25    Types: Cigarettes  . Smokeless tobacco: Never Used  .  Alcohol use No  . Drug use: No  . Sexual activity: Not on file   Other Topics Concern  . Not on file   Social History Narrative  . No narrative on file     Allergies  Allergen Reactions  . Codeine Swelling  . Penicillins Swelling    Has patient had a PCN reaction causing immediate rash, facial/tongue/throat swelling, SOB or lightheadedness with hypotension:YES Has patient had a PCN reaction causing severe rash involving mucus membranes or skin necrosis: NO Has patient had a PCN reaction that required hospitalization NO Has patient had a PCN reaction occurring within the last 10 years: NO If all of the above answers are "NO", then may proceed with Cephalosporin use.  . Tape Rash      Family History  Problem Relation Age of Onset  . Family history unknown: Yes      Prior to Admission medications   Medication Sig Start Date End Date Taking? Authorizing Provider  albuterol (PROVENTIL HFA;VENTOLIN HFA) 108 (90 BASE) MCG/ACT inhaler Inhale 2 puffs into the lungs every 6 (six) hours as needed.   Yes Historical Provider, MD  aspirin 81 MG tablet Take 81 mg by mouth daily.   Yes Historical Provider, MD  budesonide-formoterol (SYMBICORT) 160-4.5 MCG/ACT inhaler Inhale 2 puffs into the lungs 2 (two) times daily.   Yes Historical Provider, MD  citalopram (CELEXA) 20 MG tablet Take 30 mg by mouth daily. 11/12/14  Yes Historical Provider, MD  clonazePAM (KLONOPIN) 1 MG tablet Take 1 mg by mouth daily. 11/20/14  Yes Historical Provider, MD  furosemide (LASIX) 20 MG tablet Take 40 mg by mouth daily.  11/20/14  Yes Historical Provider, MD  metoprolol succinate (TOPROL-XL) 25 MG 24 hr tablet Take 25 mg by mouth daily. 11/20/14  Yes Historical Provider, MD  pantoprazole (PROTONIX) 40 MG tablet Take 40 mg by mouth 2 (two) times daily. 11/20/14  Yes Historical Provider, MD  potassium chloride (MICRO-K) 10 MEQ CR capsule Take 10 mEq by mouth daily. 11/20/14  Yes Historical Provider, MD  rosuvastatin (CRESTOR) 40 MG tablet Take 40 mg by mouth daily.   Yes Historical Provider, MD  Vitamin D, Ergocalciferol, (DRISDOL) 50000 UNITS CAPS capsule Take 50,000 Units by mouth every 7 (seven) days. No specific days   Yes Historical Provider, MD  bethanechol (URECHOLINE) 25 MG tablet Take 1 tablet (25 mg total) by mouth 3 (three) times daily. Patient not taking: Reported on 03/12/2015 02/22/15   Autumn Messing III, MD  Lactobacillus (ACIDOPHILUS PROBIOTIC) 100 MG CAPS Take 1 capsule (100 mg total) by mouth daily. Patient not taking: Reported on 11/09/2015 03/13/15   Blanchie Dessert, MD    Physical Exam:    Vitals:   11/09/15 1256 11/09/15 1354 11/09/15 1356 11/09/15 1500  BP: 144/60 136/57  102/89   Pulse: 62  (!) 49 (!) 57  Resp: 22  13   Temp:      TempSrc:      SpO2: 94%  99% 97%  Weight:           Constitutional: NAD, calm, comfortable  Vitals:   11/09/15 1256 11/09/15 1354 11/09/15 1356 11/09/15 1500  BP: 144/60 136/57  102/89  Pulse: 62  (!) 49 (!) 57  Resp: 22  13   Temp:      TempSrc:      SpO2: 94%  99% 97%  Weight:       Eyes: PERRL, lids and conjunctivae normal ENMT: Mucous membranes are moist. Posterior pharynx  clear of any exudate or lesions.Normal dentition.  Neck: normal, supple, no masses, no thyromegaly Respiratory:decreased breath sounds at the bases no wheezing, no crackles. Normal respiratory effort. No accessory muscle use.  Cardiovascular: Regular rate and rhythm, no murmurs / rubs / gallops. No extremity edema. 2+ pedal pulses. No carotid bruits.  Abdomen: obese, no tenderness, no masses palpated. No hepatosplenomegaly. Bowel sounds positive.  Musculoskeletal: no clubbing / cyanosis. No joint deformity upper and lower extremities. Good ROM, no contractures. Normal muscle tone.  Skin: no rashes, lesions, ulcers.  Neurologic: CN 2-12 grossly intact. Sensation intact, DTR normal. Strength 5/5 in all 4.  Psychiatric: Normal judgment and insight. Alert and oriented x 3. Flat affect      Labs on Admission: I have personally reviewed following labs and imaging studies  CBC:  Recent Labs Lab 11/09/15 1231  WBC 5.1  HGB 12.5  HCT 40.2  MCV 94.6  PLT 99991111    Basic Metabolic Panel:  Recent Labs Lab 11/09/15 1231  NA 138  K 3.7  CL 104  CO2 26  GLUCOSE 163*  BUN 19  CREATININE 0.74  CALCIUM 9.0    GFR: CrCl cannot be calculated (Unknown ideal weight.).  Liver Function Tests: No results for input(s): AST, ALT, ALKPHOS, BILITOT, PROT, ALBUMIN in the last 168 hours. No results for input(s): LIPASE, AMYLASE in the last 168 hours. No results for input(s): AMMONIA in the last 168 hours.  Coagulation Profile: No results for input(s):  INR, PROTIME in the last 168 hours.  Cardiac Enzymes: No results for input(s): CKTOTAL, CKMB, CKMBINDEX, TROPONINI in the last 168 hours.  BNP (last 3 results) No results for input(s): PROBNP in the last 8760 hours.  HbA1C: No results for input(s): HGBA1C in the last 72 hours.  CBG: No results for input(s): GLUCAP in the last 168 hours.  Lipid Profile: No results for input(s): CHOL, HDL, LDLCALC, TRIG, CHOLHDL, LDLDIRECT in the last 72 hours.  Thyroid Function Tests: No results for input(s): TSH, T4TOTAL, FREET4, T3FREE, THYROIDAB in the last 72 hours.  Anemia Panel: No results for input(s): VITAMINB12, FOLATE, FERRITIN, TIBC, IRON, RETICCTPCT in the last 72 hours.  Urine analysis:    Component Value Date/Time   COLORURINE AMBER (A) 03/12/2015 2006   APPEARANCEUR CLEAR 03/12/2015 2006   LABSPEC 1.031 (H) 03/12/2015 2006   PHURINE 5.0 03/12/2015 2006   GLUCOSEU NEGATIVE 03/12/2015 2006   HGBUR NEGATIVE 03/12/2015 2006   BILIRUBINUR SMALL (A) 03/12/2015 2006   KETONESUR NEGATIVE 03/12/2015 2006   PROTEINUR NEGATIVE 03/12/2015 2006   UROBILINOGEN 1.0 02/12/2015 1105   NITRITE NEGATIVE 03/12/2015 2006   LEUKOCYTESUR NEGATIVE 03/12/2015 2006    Sepsis Labs: @LABRCNTIP (procalcitonin:4,lacticidven:4) )No results found for this or any previous visit (from the past 240 hour(s)).   Radiological Exams on Admission: Dg Chest 2 View  Result Date: 11/09/2015 CLINICAL DATA:  Monday patient reports pain across her chest and headache, this morning patient reports the same pain across her chest, headache, left shoulder pain. HX CHF, COPD, HTN controlled EXAM: CHEST  2 VIEW COMPARISON:  03/12/2015 FINDINGS: Heart size is normal. The lungs are free of focal consolidations and pleural effusions. Mild bibasilar atelectasis. Moderate mid thoracic spondylosis. IMPRESSION: No evidence for acute cardiopulmonary abnormality. Electronically Signed   By: Nolon Nations M.D.   On: 11/09/2015 13:45     EKG: Independently reviewed.  Assessment/Plan Active Problems:   Chest pain   Depression   COPD (chronic obstructive pulmonary disease) with emphysema ([place])  Elevated blood sugar    Chest pain syndrome, cardiac versus musculoskeletal vs anxiety HEART score 4-5. Troponin 0 , EKG without evidence of ACS. CP unrelieved by nitroglycerin,. Took ASA 324 PTA. EKG without acute abnormalities  Admit to Telemetry/ Observation Chest pain order set Cycle troponins EKG in am continue ASA, O2 and NTG as needed Continue preadmission beta blocker and nitrate Statins  GI cocktail Check Lipid panel  Hb A1C Consult to Cards if CP is not relieved or Tn increase or EKG changes   Hyperglycemia  Current blood sugar level is  Lab Results  Component Value Date   HGBA1C 6.7 (H) 02/12/2015   Hgb A1C Hold home oral diabetic medications.  Lantus , SSI Heart healthy carb modified diet.  COPD without  Exacerbation, on 2 l home O2  Continue home meds  Hyperlipidemia Continue home statins   Anxiety/ Depression Continue Celexa, Clonazepam  DVT prophylaxis:  Lovenox Code Status: DNI Family Communication:  Discussed with family Disposition Plan: Expect patient to be discharged to home after condition improves Consults called:    None Admission status:Tele  Obs    Zaydyn Havey E, PA-C [facility]   If 7PM-7AM, please contact night-coverage [email or web address] Password [facility]  11/09/2015, 3:26 PM

## 2015-11-10 DIAGNOSIS — R079 Chest pain, unspecified: Secondary | ICD-10-CM

## 2015-11-10 DIAGNOSIS — E1165 Type 2 diabetes mellitus with hyperglycemia: Secondary | ICD-10-CM | POA: Diagnosis not present

## 2015-11-10 DIAGNOSIS — I110 Hypertensive heart disease with heart failure: Secondary | ICD-10-CM | POA: Diagnosis not present

## 2015-11-10 DIAGNOSIS — R072 Precordial pain: Secondary | ICD-10-CM | POA: Diagnosis not present

## 2015-11-10 DIAGNOSIS — I5032 Chronic diastolic (congestive) heart failure: Secondary | ICD-10-CM | POA: Diagnosis not present

## 2015-11-10 LAB — TROPONIN I: Troponin I: <0.03 ng/mL (ref <0.03)

## 2015-11-10 MED ORDER — SENNOSIDES-DOCUSATE SODIUM 8.6-50 MG PO TABS
1.0000 | ORAL_TABLET | Freq: Two times a day (BID) | ORAL | Status: DC
  Administered 2015-11-10 – 2015-11-11 (×2): 1 via ORAL
  Filled 2015-11-10 (×2): qty 1

## 2015-11-10 MED ORDER — CETYLPYRIDINIUM CHLORIDE 0.05 % MT LIQD: 7.0000 mL | Freq: Two times a day (BID) | OROMUCOSAL | Status: DC

## 2015-11-10 MED ORDER — CLONAZEPAM 1 MG PO TABS
1.0000 mg | ORAL_TABLET | Freq: Every evening | ORAL | Status: DC
  Administered 2015-11-10: 1 mg via ORAL
  Filled 2015-11-10: qty 1

## 2015-11-10 MED ORDER — CITALOPRAM HYDROBROMIDE 20 MG PO TABS: 20.0000 mg | ORAL_TABLET | Freq: Every day | ORAL | Status: DC

## 2015-11-10 NOTE — Progress Notes (Signed)
PROGRESS NOTE    Danielle [name]  [id number] DOB: [birth year] DOA: 11/09/2015 PCP: Gilford Rile, MD Brief Narrative: emphysema, HLD, DJD,osteoarthritis, morbid obesity,  depression, brought by EMS from [facility] to [facility] for evaluation of CP.  PAin began this morning, sudden, radiating to the left arm and epigastrium, along with a sudden onset of headache which has resolved by now. She took 324 mg ASA PTA, pain decreased from 10 to 3/10. At the ED received Nitroglycerin without significant relief. She denies however any furhter severe episodes since admissiom. She reports similar episodes over the last 2 weeks  Assessment & Plan: Atypical Chest pain with some typical features - HEART score 4-5. Troponin 0 , EKG without evidence of ACS. -  Reports having a cath in 2014 and having a 40% lesion, I cannot see this in care everywhere and stress test in 2015 which was negative per pt report - will request cards input  DM -pt was unaware of this diagnosis  Recent Labs       Lab Results  Component Value Date   HGBA1C 6.7 (H) 02/12/2015    -Dietician consult, PO meds at DC  COPD without  Exacerbation, on 2 l home O2  Continue home meds  Hyperlipidemia Continue home statins  Anxiety/ Depression Continue Celexa, Clonazepam  DVT prophylaxis:  Lovenox Code Status: DNI Family Communication:  none at bedside Disposition Plan: home pending Cards eval  Consultants:  Cards   Subjective: Feels tired, ok otherwise, no dyspnea, mild chest pressure from time to time  Objective: Vitals:   11/09/15 1641 11/09/15 2042 11/09/15 2100 11/10/15 0240  BP: (!) 148/76 (!) 150/65  (!) 149/56  Pulse: (!) 53 (!) 51  (!) 47  Resp:  20  (!) 24  Temp: 97.2 F (36.2 C) 98.6 F (37 C)  97.7 F (36.5 C)  TempSrc: Oral Oral  Oral  SpO2: 100% 95% 98% 100%  Weight: 113.4 kg (250 lb 1.6 oz)     Height: 5\' 3"  (1.6 m)       Intake/Output Summary (Last 24 hours) at 11/10/15 0822 Last data  filed at 11/10/15 0300  Gross per 24 hour  Intake          1088.75 ml  Output              675 ml  Net           413.75 ml   Filed Weights   11/09/15 1231 11/09/15 1641  Weight: 112.9 kg (249 lb) 113.4 kg (250 lb 1.6 oz)    Examination:  General exam: Appears calm and comfortable  Respiratory system: Clear to auscultation. Respiratory effort normal. Cardiovascular system: S1 & S2 heard, RRR. No JVD, murmurs, rubs, gallops or clicks. No pedal edema. Gastrointestinal system: Abdomen is nondistended, soft and nontender. No organomegaly or masses felt. Normal bowel sounds heard. Central nervous system: Alert and oriented. No focal neurological deficits. Extremities: Symmetric 5 x 5 power. Skin: No rashes, lesions or ulcers Psychiatry: Judgement and insight appear normal. Mood & affect appropriate.     Data Reviewed: I have personally reviewed following labs and imaging studies  CBC:  Recent Labs Lab 11/09/15 1231  WBC 5.1  HGB 12.5  HCT 40.2  MCV 94.6  PLT 99991111   Basic Metabolic Panel:  Recent Labs Lab 11/09/15 1231  NA 138  K 3.7  CL 104  CO2 26  GLUCOSE 163*  BUN 19  CREATININE 0.74  CALCIUM 9.0   GFR: Estimated Creatinine  Clearance: 68 mL/min (by C-G formula based on SCr of 0.8 mg/dL). Liver Function Tests: No results for input(s): AST, ALT, ALKPHOS, BILITOT, PROT, ALBUMIN in the last 168 hours. No results for input(s): LIPASE, AMYLASE in the last 168 hours. No results for input(s): AMMONIA in the last 168 hours. Coagulation Profile: No results for input(s): INR, PROTIME in the last 168 hours. Cardiac Enzymes:  Recent Labs Lab 11/09/15 2048 11/10/15 0226  TROPONINI <0.03 <0.03   BNP (last 3 results) No results for input(s): PROBNP in the last 8760 hours. HbA1C: No results for input(s): HGBA1C in the last 72 hours. CBG: No results for input(s): GLUCAP in the last 168 hours. Lipid Profile: No results for input(s): CHOL, HDL, LDLCALC, TRIG,  CHOLHDL, LDLDIRECT in the last 72 hours. Thyroid Function Tests: No results for input(s): TSH, T4TOTAL, FREET4, T3FREE, THYROIDAB in the last 72 hours. Anemia Panel: No results for input(s): VITAMINB12, FOLATE, FERRITIN, TIBC, IRON, RETICCTPCT in the last 72 hours. Urine analysis:    Component Value Date/Time   COLORURINE AMBER (A) 03/12/2015 2006   APPEARANCEUR CLEAR 03/12/2015 2006   LABSPEC 1.031 (H) 03/12/2015 2006   PHURINE 5.0 03/12/2015 2006   GLUCOSEU NEGATIVE 03/12/2015 2006   HGBUR NEGATIVE 03/12/2015 2006   BILIRUBINUR SMALL (A) 03/12/2015 2006   KETONESUR NEGATIVE 03/12/2015 2006   PROTEINUR NEGATIVE 03/12/2015 2006   UROBILINOGEN 1.0 02/12/2015 1105   NITRITE NEGATIVE 03/12/2015 2006   LEUKOCYTESUR NEGATIVE 03/12/2015 2006   Sepsis Labs: @LABRCNTIP (procalcitonin:4,lacticidven:4)  )No results found for this or any previous visit (from the past 240 hour(s)).       Radiology Studies: Dg Chest 2 View  Result Date: 11/09/2015 CLINICAL DATA:  Monday patient reports pain across her chest and headache, this morning patient reports the same pain across her chest, headache, left shoulder pain. HX CHF, COPD, HTN controlled EXAM: CHEST  2 VIEW COMPARISON:  03/12/2015 FINDINGS: Heart size is normal. The lungs are free of focal consolidations and pleural effusions. Mild bibasilar atelectasis. Moderate mid thoracic spondylosis. IMPRESSION: No evidence for acute cardiopulmonary abnormality. Electronically Signed   By: Nolon Nations M.D.   On: 11/09/2015 13:45        Scheduled Meds: . aspirin EC  81 mg Oral Daily  . citalopram  30 mg Oral Daily  . clonazePAM  1 mg Oral Daily  . enoxaparin (LOVENOX) injection  40 mg Subcutaneous Q24H  . mometasone-formoterol  2 puff Inhalation BID  . rosuvastatin  40 mg Oral Daily   Continuous Infusions: . sodium chloride 75 mL/hr at 11/09/15 1829     LOS: 0 days    Time spent: 54min    Domenic Polite, MD [facility] Pager [phone]  If 7PM-7AM, please contact night-coverage [email or web address] Password [email or web address] 11/10/2015, 8:22 AM

## 2015-11-10 NOTE — Plan of Care (Signed)
Problem: Food- and Nutrition-Related Knowledge Deficit (NB-1.1) Goal: Nutrition education Formal process to instruct or train a patient/client in a skill or to impart knowledge to help patients/clients voluntarily manage or modify food choices and eating behavior to maintain or improve health. Outcome: Completed/Met Date Met: 11/10/15  RD consulted for nutrition education regarding diabetes.  Pt was unaware she had diabetes.  Lab Results  Component Value Date   HGBA1C 6.7 (H) 02/12/2015    RD provided "Carbohydrate Counting for People with Diabetes" handout from the Academy of Nutrition and Dietetics. Discussed different food groups and their effects on blood sugar, emphasizing carbohydrate-containing foods. Provided list of carbohydrates and recommended serving sizes of common foods.  Discussed importance of controlled and consistent carbohydrate intake throughout the day. Provided examples of ways to balance meals/snacks and encouraged intake of high-fiber, whole grain complex carbohydrates. Teach back method used.  Expect fair compliance.  Body mass index is 44.3 kg/m. Pt meets criteria for Obesity Class III based on current BMI.  Current diet order is NPO.  Labs and medications reviewed.   No further nutrition interventions warranted at this time. RD contact information provided. If additional nutrition issues arise, please re-consult RD.  Arthur Holms, RD, LDN Pager #: [phone] After-Hours Pager #: [phone]

## 2015-11-10 NOTE — Consult Note (Signed)
Primary cardiologist: Dr Altamease Oiler [facility] Consulting cardiologist: Dr Carlyle Dolly MD Requesting physician: Dr Domenic Polite Indication: Chest pain  Clinical Summary Danielle [name] is a 82 y.o.female history of chronic diastolic HF, COPD on home O2, HL, admitted with chest pain. Two episodes over the last week First episode occurred at rest while watching tv. 10/10 sharp pain throughout entire chest into left should, pounding in head. No other associated symptoms. Lasted just a few minutes and self resolved. Repeat episode yesterday while also at rest.  She reports a previous cath at [facility] a few years ago, told she had a 40% blockage at the time. Reports a stress test over a year ago that was reportedly normal.    K 3.7, Cr 0.74, Hgb 12.5, Plt 191, BNP 106,  Trop neg x 3 CXR no acute process EKG SR, no acute ischemic changes Echo pending    Allergies  Allergen Reactions  . Codeine Swelling  . Penicillins Swelling    Has patient had a PCN reaction causing immediate rash, facial/tongue/throat swelling, SOB or lightheadedness with hypotension:YES Has patient had a PCN reaction causing severe rash involving mucus membranes or skin necrosis: NO Has patient had a PCN reaction that required hospitalization NO Has patient had a PCN reaction occurring within the last 10 years: NO If all of the above answers are "NO", then may proceed with Cephalosporin use.  . Tape Rash    Medications Scheduled Medications: . aspirin EC  81 mg Oral Daily  . citalopram  30 mg Oral Daily  . clonazePAM  1 mg Oral QPM  . enoxaparin (LOVENOX) injection  40 mg Subcutaneous Q24H  . mometasone-formoterol  2 puff Inhalation BID  . rosuvastatin  40 mg Oral Daily     Infusions:     PRN Medications:  acetaminophen, albuterol, gi cocktail, ondansetron (ZOFRAN) IV   Past Medical History:  Diagnosis Date  . Anginal pain ([place])   . Arthritis   . Asthma   . CHF  (congestive heart failure) ([place])   . COPD (chronic obstructive pulmonary disease) ([place])   . Depression   . GERD (gastroesophageal reflux disease)   . Headache   . History of hiatal hernia   . Hypertension   . Osteoporosis   . Pneumonia 2013  . PONV (postoperative nausea and vomiting)   . Shortness of breath dyspnea    with exertion    Past Surgical History:  Procedure Laterality Date  . ABDOMINAL HYSTERECTOMY    . APPENDECTOMY    . BREAST SURGERY  40 yrs. ago   growth removed in each breast-benign  . CARDIAC CATHETERIZATION     12/14/12 LHC ([facility]): few mild stenotic areas max 40% of ectatic RCA o/w NL coronaries, EF 65%. Med tx.  Marland Kitchen CATARACT EXTRACTION W/ INTRAOCULAR LENS  IMPLANT, BILATERAL Bilateral 15 yrs ago  . CHOLECYSTECTOMY    . FOOT SURGERY    . LAPAROSCOPIC APPENDECTOMY N/A 02/19/2015   Procedure: EXCISION OF MESENTERIC MASS;  Surgeon: Stark Klein, MD;  Location: [place];  Service: General;  Laterality: N/A;  . REPLACEMENT TOTAL KNEE BILATERAL      Family History  Problem Relation Age of Onset  . Family history unknown: Yes    Social History Danielle [name] reports that she has been smoking Cigarettes.  She has been smoking about 0.25 packs per day. She has never used smokeless tobacco. Danielle [name] reports that she does not drink alcohol.  Review of Systems  CONSTITUTIONAL: No weight loss, fever, chills, weakness or fatigue.  HEENT: Eyes: No visual loss, blurred vision, double vision or yellow sclerae. No hearing loss, sneezing, congestion, runny nose or sore throat.  SKIN: No rash or itching.  CARDIOVASCULAR: per HPI  RESPIRATORY: No shortness of breath, cough or sputum.  GASTROINTESTINAL: No anorexia, nausea, vomiting or diarrhea. No abdominal pain or blood.  GENITOURINARY: no polyuria, no dysuria NEUROLOGICAL: No headache, dizziness, syncope, paralysis, ataxia, numbness or tingling in the extremities. No change in bowel or bladder control.  MUSCULOSKELETAL: No  muscle, back pain, joint pain or stiffness.  HEMATOLOGIC: No anemia, bleeding or bruising.  LYMPHATICS: No enlarged nodes. No history of splenectomy.  PSYCHIATRIC: No history of depression or anxiety.      Physical Examination Blood pressure (!) 149/56, pulse (!) 47, temperature 97.7 F (36.5 C), temperature source Oral, resp. rate (!) 24, height 5\' 3"  (1.6 m), weight 250 lb 1.6 oz (113.4 kg), SpO2 100 %.  Intake/Output Summary (Last 24 hours) at 11/10/15 1420 Last data filed at 11/10/15 0300  Gross per 24 hour  Intake          1088.75 ml  Output              425 ml  Net           663.75 ml    HEENT: sclera clear, throat clear  Cardiovascular: RRR, no m/r/g, no jvd  Respiratory: CTAB  GI: abdomen soft, NT, ND  MSK: no LE edema  Neuro: no focal deficits  Psych: appropriate affect   Lab Results  Basic Metabolic Panel:  Recent Labs Lab 11/09/15 1231  NA 138  K 3.7  CL 104  CO2 26  GLUCOSE 163*  BUN 19  CREATININE 0.74  CALCIUM 9.0    Liver Function Tests: No results for input(s): AST, ALT, ALKPHOS, BILITOT, PROT, ALBUMIN in the last 168 hours.  CBC:  Recent Labs Lab 11/09/15 1231  WBC 5.1  HGB 12.5  HCT 40.2  MCV 94.6  PLT 191    Cardiac Enzymes:  Recent Labs Lab 11/09/15 2048 11/10/15 0226 11/10/15 0808  TROPONINI <0.03 <0.03 <0.03    BNP: Invalid input(s): POCBNP     Impression/Recommendations 1. Chest pain - unclear etiology, no objective evidence of ACS at this time - we will plan for lexiscan tomorrow to further evaluate   Carlyle Dolly, M.D

## 2015-11-11 ENCOUNTER — Observation Stay (HOSPITAL_COMMUNITY): Payer: Medicare Other

## 2015-11-11 ENCOUNTER — Observation Stay (HOSPITAL_BASED_OUTPATIENT_CLINIC_OR_DEPARTMENT_OTHER): Payer: Medicare Other

## 2015-11-11 DIAGNOSIS — R0789 Other chest pain: Secondary | ICD-10-CM

## 2015-11-11 DIAGNOSIS — I5032 Chronic diastolic (congestive) heart failure: Secondary | ICD-10-CM

## 2015-11-11 DIAGNOSIS — R072 Precordial pain: Secondary | ICD-10-CM

## 2015-11-11 DIAGNOSIS — R079 Chest pain, unspecified: Secondary | ICD-10-CM | POA: Diagnosis not present

## 2015-11-11 HISTORY — DX: Other chest pain: R07.89

## 2015-11-11 HISTORY — DX: Chronic diastolic (congestive) heart failure: I50.32

## 2015-11-11 LAB — ECHOCARDIOGRAM COMPLETE
CHL CUP DOP CALC LVOT VTI: 36.4 cm
E/e' ratio: 13.84
EWDT: 289 ms
FS: 40 % (ref 28–44)
HEIGHTINCHES: 63 in
IVS/LV PW RATIO, ED: 1.08
LA diam end sys: 44 mm
LA vol: 62.1 mL
LADIAMINDEX: 1.9 cm/m2
LASIZE: 44 mm
LAVOLA4C: 55.7 mL
LAVOLIN: 26.9 mL/m2
LV PW d: 12.7 mm — AB (ref 0.6–1.1)
LV TDI E'LATERAL: 7.18
LV TDI E'MEDIAL: 8.27
LVEEAVG: 13.84
LVEEMED: 13.84
LVELAT: 7.18 cm/s
LVOT area: 2.27 cm2
LVOT peak grad rest: 8 mmHg
LVOTD: 17 mm
LVOTPV: 144 cm/s
LVOTSV: 83 mL
Lateral S' vel: 12.8 cm/s
MV Dec: 289
MV Peak grad: 4 mmHg
MV pk A vel: 99 m/s
MV pk E vel: 99.4 m/s
RV sys press: 35 mmHg
Reg peak vel: 260 cm/s
TAPSE: 24.3 mm
TRMAXVEL: 260 cm/s
Weight: 4001.6 oz

## 2015-11-11 LAB — NM MYOCAR MULTI W/SPECT W/WALL MOTION / EF
CSEPED: 0 min
CSEPEDS: 0 s
Estimated workload: 1 METS
MPHR: 140 {beats}/min
Peak HR: 73 {beats}/min
Percent HR: 52 %
Rest HR: 54 {beats}/min

## 2015-11-11 LAB — TSH: TSH: 1.186 u[IU]/mL (ref 0.350–4.500)

## 2015-11-11 MED ORDER — TECHNETIUM TC 99M TETROFOSMIN IV KIT
30.0000 | PACK | Freq: Once | INTRAVENOUS | Status: AC | PRN
  Administered 2015-11-11: 30 via INTRAVENOUS

## 2015-11-11 MED ORDER — REGADENOSON 0.4 MG/5ML IV SOLN
INTRAVENOUS | Status: AC
  Administered 2015-11-11: 0.4 mg via INTRAVENOUS
  Filled 2015-11-11: qty 5

## 2015-11-11 MED ORDER — AMLODIPINE BESYLATE 5 MG PO TABS: 5.0000 mg | ORAL_TABLET | Freq: Every day | ORAL | Status: DC

## 2015-11-11 MED ORDER — AMLODIPINE BESYLATE 5 MG PO TABS: 5.0000 mg | ORAL_TABLET | Freq: Every day | ORAL | 1 refills | Status: DC

## 2015-11-11 MED ORDER — TECHNETIUM TC 99M TETROFOSMIN IV KIT
10.0000 | PACK | Freq: Once | INTRAVENOUS | Status: AC | PRN
  Administered 2015-11-11: 10 via INTRAVENOUS

## 2015-11-11 MED ORDER — REGADENOSON 0.4 MG/5ML IV SOLN
0.4000 mg | Freq: Once | INTRAVENOUS | Status: AC
  Administered 2015-11-11: 0.4 mg via INTRAVENOUS
  Filled 2015-11-11: qty 5

## 2015-11-11 MED ORDER — METOPROLOL SUCCINATE ER 25 MG PO TB24: 12.5000 mg | ORAL_TABLET | Freq: Every day | ORAL | Status: DC

## 2015-11-11 MED ORDER — METFORMIN HCL 500 MG PO TABS: 500.0000 mg | ORAL_TABLET | Freq: Every day | ORAL | 0 refills | Status: DC

## 2015-11-11 NOTE — Progress Notes (Signed)
   Danielle [name] presented for a lexiscan cardiolite today.  No immediate complications.  Stress imaging is pending at this time.  Murray Hodgkins, NP 11/11/2015, 10:11 AM

## 2015-11-11 NOTE — Progress Notes (Signed)
Pt back in room following stress test, c/o nausea and headache and appears very tired.  IV zofran administered, she dose not desire anything for HA at this time and asked if she could postpone taking her 1000 meds until she is less nauseated.

## 2015-11-11 NOTE — Progress Notes (Signed)
    Stress test results reviewed, small area of likely ischemia apex but overall low risk. Would continue medical management at this time. Resume beta blocker, start norvasc 5mg  daily as additional antianginal.  ok for discharge today after echo. She should f/u with her primary cardiolgist within 3 weeks.    Zandra Abts MD

## 2015-11-11 NOTE — Progress Notes (Signed)
  Echocardiogram 2D Echocardiogram has been performed.  Bobbye Charleston 11/11/2015, 3:04 PM

## 2015-11-11 NOTE — Progress Notes (Signed)
Patient Name: Danielle [name] Date of Encounter: 11/11/2015  Hospital Problem List     Principal Problem:   Midsternal chest pain Active Problems:   COPD (chronic obstructive pulmonary disease) with emphysema (HCC)   Elevated blood sugar   Chronic diastolic CHF (congestive heart failure) ([facility])   Depression    Subjective   No further c/p.  Trop neg.  For MV today.    Inpatient Medications    . antiseptic oral rinse  7 mL Mouth Rinse BID  . aspirin EC  81 mg Oral Daily  . citalopram  30 mg Oral Daily  . clonazePAM  1 mg Oral QPM  . enoxaparin (LOVENOX) injection  40 mg Subcutaneous Q24H  . mometasone-formoterol  2 puff Inhalation BID  . rosuvastatin  40 mg Oral Daily  . senna-docusate  1 tablet Oral BID    Vital Signs    Vitals:   11/10/15 1512 11/10/15 2125 11/11/15 0450 11/11/15 0919  BP: (!) 161/86 (!) 150/55 (!) 165/52 (!) 142/88  Pulse: (!) 50 62 (!) 51 (!) 53  Resp: 19 19 18    Temp: 98 F (36.7 C) 97.7 F (36.5 C) 97.9 F (36.6 C)   TempSrc: Oral Oral Oral   SpO2: 98% 100% 97%   Weight:      Height:        Intake/Output Summary (Last 24 hours) at 11/11/15 0959 Last data filed at 11/10/15 1630  Gross per 24 hour  Intake              360 ml  Output                0 ml  Net              360 ml   Filed Weights   11/09/15 1231 11/09/15 1641  Weight: 249 lb (112.9 kg) 250 lb 1.6 oz (113.4 kg)    Physical Exam    General: Pleasant, NAD. Neuro: Alert and oriented X 3. Moves all extremities spontaneously. Psych: Normal affect. HEENT:  Normal  Neck: Supple without bruits or JVD. Lungs:  Resp regular and unlabored, CTA. Heart: RRR no s3, s4, 2/6 SEM USB. Abdomen: Soft, non-tender, non-distended, BS + x 4.  Extremities: No clubbing, cyanosis or edema. DP/PT/Radials 2+ and equal bilaterally.  Labs    CBC  Recent Labs  11/09/15 1231  WBC 5.1  HGB 12.5  HCT 40.2  MCV 94.6  PLT 99991111   Basic Metabolic Panel  Recent Labs  11/09/15 1231    NA 138  K 3.7  CL 104  CO2 26  GLUCOSE 163*  BUN 19  CREATININE 0.74  CALCIUM 9.0   Cardiac Enzymes  Recent Labs  11/09/15 2048 11/10/15 0226 11/10/15 0808  TROPONINI <0.03 <0.03 <0.03    Telemetry    Seen in nuc med - sinus  Radiology    Dg Chest 2 View  Result Date: 11/09/2015 CLINICAL DATA:  Monday patient reports pain across her chest and headache, this morning patient reports the same pain across her chest, headache, left shoulder pain. HX CHF, COPD, HTN controlled EXAM: CHEST  2 VIEW COMPARISON:  03/12/2015 FINDINGS: Heart size is normal. The lungs are free of focal consolidations and pleural effusions. Mild bibasilar atelectasis. Moderate mid thoracic spondylosis. IMPRESSION: No evidence for acute cardiopulmonary abnormality. Electronically Signed   By: Nolon Nations M.D.   On: 11/09/2015 13:45    Assessment & Plan    1.  Midsternal chest  pain:  No further chest pain.  CE neg.  For myoview today.  Signed, Murray Hodgkins NP

## 2015-11-16 ENCOUNTER — Ambulatory Visit
Admission: RE | Admit: 2015-11-16 | Discharge: 2015-11-16 | Disposition: A | Discharge status: Home / Self Care | Payer: Medicare Other | Source: Ambulatory Visit | Attending: General Surgery | Admitting: General Surgery

## 2015-11-16 DIAGNOSIS — R109 Unspecified abdominal pain: Secondary | ICD-10-CM

## 2015-11-16 MED ORDER — IOPAMIDOL (ISOVUE-300) INJECTION 61%
100.0000 mL | Freq: Once | INTRAVENOUS | Status: AC | PRN
  Administered 2015-11-16: 100 mL via INTRAVENOUS

## 2015-11-20 NOTE — Progress Notes (Signed)
Please let patient know that Ct looks OK.  Nothing acute is going on.  No active inflammation of diverticulitis.

## 2015-12-04 DIAGNOSIS — J439 Emphysema, unspecified: Secondary | ICD-10-CM | POA: Insufficient documentation

## 2015-12-05 NOTE — Discharge Summary (Signed)
Physician Discharge Summary  HAYDYN MACKINS [id number] DOB: 1933/07/07 DOA: 11/09/2015  PCP: Gilford Rile, MD  Admit date: 11/09/2015 Discharge date: 11/11/2015  Time spent: 35 minutes  Recommendations for Outpatient Follow-up:  1. PCP in 1 week, please note New DM-started on metformin   Discharge Diagnoses:  Principal Problem:   Midsternal chest pain Active Problems:   Depression   COPD (chronic obstructive pulmonary disease) with emphysema (HCC)   Diabetes Mellitus   Chronic diastolic CHF (congestive heart failure) ([place])   Discharge Condition:stable  Diet recommendation: heart healthy/diabetic  Filed Weights   11/09/15 1231 11/09/15 1641  Weight: 112.9 kg (249 lb) 113.4 kg (250 lb 1.6 oz)    History of present illness:  Danielle [name] is a 82 y.o. female with medical history significant for diastolic CHF, COPD on 2 l O2 at night, asthma/ emphysema, HLD, DJD,osteoarthritis, morbid obesity,  depression, brought by EMS from [facility] to [facility] for evaluation of CP.  PAin began 8/11 morning, sudden, radiating to the left arm and epigastrium, along with a sudden onset of headache which has resolved by now. She took 324 mg ASA PTA, pain decreased from 10 to 3/10. At the ED received Nitroglycerin without significant relief.   Hospital Course:  Atypical Chest pain with some typical features - HEART score 4-5. Troponin 0, EKG without evidence of ACS. -  Reports having a cath in 2014 and having a 40% lesion, we couldnot see this in care everywhere and stress test in 2015 which was negative per pt report - seen by Cardiology in consultation, underwent stress testing today which was read as a low risk study and no further cardiac recommendations were suggested by cardiology at this time  DM -pt was unaware of this diagnosis  Recent Labs       Lab Results  Component Value Date   HGBA1C 6.7 (H) 02/12/2015    -Dietician consulted, started on metformin at  discharge  COPD without Exacerbation, on 2 l home O2  Continue home meds  Hyperlipidemia Continue home statins  Anxiety/ Depression Continue Celexa, Clonazepam    Consultations:  Cardiology  Discharge Exam: Vitals:   11/11/15 1008 11/11/15 1331  BP: (!) 160/76 (!) 144/56  Pulse: 69 (!) 53  Resp:  17  Temp:  98.2 F (36.8 C)    General: AAOx3 Cardiovascular: S1S2/RRR Respiratory: CTAB  Discharge Instructions   Discharge Instructions    Diet - low sodium heart healthy    Complete by:  As directed   Diet - low sodium heart healthy    Complete by:  As directed   Diet Carb Modified    Complete by:  As directed   Increase activity slowly    Complete by:  As directed   Increase activity slowly    Complete by:  As directed     Discharge Medication List as of 11/11/2015  2:45 PM    START taking these medications   Details  amLODipine (NORVASC) 5 MG tablet Take 1 tablet (5 mg total) by mouth daily., Starting Sun 11/11/2015, Normal    metFORMIN (GLUCOPHAGE) 500 MG tablet Take 1 tablet (500 mg total) by mouth daily with breakfast., Starting Sun 11/11/2015, Print      CONTINUE these medications which have CHANGED   Details  citalopram (CELEXA) 20 MG tablet Take 1 tablet (20 mg total) by mouth daily., Starting Sat 11/10/2015, No Print      CONTINUE these medications which have NOT CHANGED   Details  albuterol (PROVENTIL  HFA;VENTOLIN HFA) 108 (90 BASE) MCG/ACT inhaler Inhale 2 puffs into the lungs every 6 (six) hours as needed., Until Discontinued, Historical Med    aspirin 81 MG tablet Take 81 mg by mouth daily., Until Discontinued, Historical Med    budesonide-formoterol (SYMBICORT) 160-4.5 MCG/ACT inhaler Inhale 2 puffs into the lungs 2 (two) times daily., Until Discontinued, Historical Med    clonazePAM (KLONOPIN) 1 MG tablet Take 1 mg by mouth daily., Starting 11/20/2014, Until Discontinued, Historical Med    furosemide (LASIX) 20 MG tablet Take 40 mg by mouth  daily. , Starting Mon 11/20/2014, Historical Med    metoprolol succinate (TOPROL-XL) 25 MG 24 hr tablet Take 25 mg by mouth daily., Starting 11/20/2014, Until Discontinued, Historical Med    pantoprazole (PROTONIX) 40 MG tablet Take 40 mg by mouth 2 (two) times daily., Starting 11/20/2014, Until Discontinued, Historical Med    potassium chloride (MICRO-K) 10 MEQ CR capsule Take 10 mEq by mouth daily., Starting 11/20/2014, Until Discontinued, Historical Med    rosuvastatin (CRESTOR) 40 MG tablet Take 40 mg by mouth daily., Until Discontinued, Historical Med    Vitamin D, Ergocalciferol, (DRISDOL) 50000 UNITS CAPS capsule Take 50,000 Units by mouth every 7 (seven) days. No specific days, Until Discontinued, Historical Med    bethanechol (URECHOLINE) 25 MG tablet Take 1 tablet (25 mg total) by mouth 3 (three) times daily., Starting 02/22/2015, Until Discontinued, Normal    Lactobacillus (ACIDOPHILUS PROBIOTIC) 100 MG CAPS Take 1 capsule (100 mg total) by mouth daily., Starting 03/13/2015, Until Discontinued, Print       Allergies  Allergen Reactions  . Codeine Swelling  . Penicillins Swelling    Has patient had a PCN reaction causing immediate rash, facial/tongue/throat swelling, SOB or lightheadedness with hypotension:YES Has patient had a PCN reaction causing severe rash involving mucus membranes or skin necrosis: NO Has patient had a PCN reaction that required hospitalization NO Has patient had a PCN reaction occurring within the last 10 years: NO If all of the above answers are "NO", then may proceed with Cephalosporin use.  . Tape Rash   Follow-up Information    GRISSO,GREG, MD. Schedule an appointment as soon as possible for a visit in 1 week(s).   Specialty:  Internal Medicine Contact information: [address] [place] [address] [phone]            The results of significant diagnostics from this hospitalization (including imaging, microbiology, ancillary and  laboratory) are listed below for reference.    Significant Diagnostic Studies: Dg Chest 2 View  Result Date: 11/09/2015 CLINICAL DATA:  Monday patient reports pain across her chest and headache, this morning patient reports the same pain across her chest, headache, left shoulder pain. HX CHF, COPD, HTN controlled EXAM: CHEST  2 VIEW COMPARISON:  03/12/2015 FINDINGS: Heart size is normal. The lungs are free of focal consolidations and pleural effusions. Mild bibasilar atelectasis. Moderate mid thoracic spondylosis. IMPRESSION: No evidence for acute cardiopulmonary abnormality. Electronically Signed   By: Nolon Nations M.D.   On: 11/09/2015 13:45   Ct Abdomen Pelvis W Contrast  Result Date: 11/16/2015 CLINICAL DATA:  Right lower abdominal pain for 1 year. EXAM: CT ABDOMEN AND PELVIS WITH CONTRAST TECHNIQUE: Multidetector CT imaging of the abdomen and pelvis was performed using the standard protocol following bolus administration of intravenous contrast. CONTRAST:  180mL ISOVUE-300 IOPAMIDOL (ISOVUE-300) INJECTION 61% COMPARISON:  12/15/2014 FINDINGS: Lower chest: Lung bases are clear. No effusions. Heart is normal size. Calcifications within the visualized  right coronary artery. Hepatobiliary: No focal hepatic abnormality.  Prior cholecystectomy. Pancreas: No focal abnormality or ductal dilatation. Spleen: No focal abnormality.  Normal size. Adrenals/Urinary Tract: Small nodule in the left adrenal gland is stable in size, 16 mm. Right adrenal gland and kidneys unremarkable. Small cysts scattered throughout the kidneys, stable. No hydronephrosis. Urinary bladder decompressed, grossly unremarkable. Stomach/Bowel: Sigmoid diverticulosis. No active diverticulitis. Stomach and small bowel decompressed, grossly unremarkable. Vascular/Lymphatic: Aortic and iliac calcifications. No aneurysm. No adenopathy. Reproductive: Prior hysterectomy.  No adnexal masses. Other: No free fluid or free air. Right inguinal hernia  containing fat. Musculoskeletal: No acute bony abnormality or focal bone lesion. Degenerative changes in the lumbar spine. IMPRESSION: No acute findings in the abdomen or pelvis. Sigmoid diverticulosis. Stable left adrenal nodule. Aortic atherosclerosis.  Coronary artery disease. Electronically Signed   By: Rolm Baptise M.D.   On: 11/16/2015 15:22   Nm Myocar Multi W/spect W/wall Motion / Ef  Result Date: 11/11/2015 CLINICAL DATA:  Chest pain EXAM: MYOCARDIAL IMAGING WITH SPECT (REST AND PHARMACOLOGIC-STRESS) GATED LEFT VENTRICULAR WALL MOTION STUDY LEFT VENTRICULAR EJECTION FRACTION TECHNIQUE: Standard myocardial SPECT imaging was performed after resting intravenous injection of 10 mCi Tc-77m tetrofosmin. Subsequently, intravenous infusion of Lexiscan was performed under the supervision of the Cardiology staff. At peak effect of the drug, 30 mCi Tc-57m tetrofosmin was injected intravenously and standard myocardial SPECT imaging was performed. Quantitative gated imaging was also performed to evaluate left ventricular wall motion, and estimate left ventricular ejection fraction. COMPARISON:  None. FINDINGS: Perfusion: There is a small area of stress-induced ischemia in the anterior septal apex. No large fixed defects. Wall Motion: Normal left ventricular wall motion. No left ventricular dilation. Left Ventricular Ejection Fraction: 65 % End diastolic volume 123XX123 ml End systolic volume 35 ml IMPRESSION: 1. Small area of stress-induced ischemia at the anteroseptal apex. 2. Normal left ventricular wall motion. 3. Left ventricular ejection fraction 65% 4. Non invasive risk stratification*: Low risk. *2012 Appropriate Use Criteria for Coronary Revascularization Focused Update: J Am Coll Cardiol. N6492421. http://content.airportbarriers.com.aspx?articleid=1201161 Electronically Signed   By: Marybelle Killings M.D.   On: 11/11/2015 12:36    Microbiology: No results found for this or any previous visit (from  the past 240 hour(s)).   Labs: Basic Metabolic Panel: No results for input(s): NA, K, CL, CO2, GLUCOSE, BUN, CREATININE, CALCIUM, MG, PHOS in the last 168 hours. Liver Function Tests: No results for input(s): AST, ALT, ALKPHOS, BILITOT, PROT, ALBUMIN in the last 168 hours. No results for input(s): LIPASE, AMYLASE in the last 168 hours. No results for input(s): AMMONIA in the last 168 hours. CBC: No results for input(s): WBC, NEUTROABS, HGB, HCT, MCV, PLT in the last 168 hours. Cardiac Enzymes: No results for input(s): CKTOTAL, CKMB, CKMBINDEX, TROPONINI in the last 168 hours. BNP: BNP (last 3 results)  Recent Labs  11/09/15 1231  BNP 106.3*    ProBNP (last 3 results) No results for input(s): PROBNP in the last 8760 hours.  CBG: No results for input(s): GLUCAP in the last 168 hours.     SignedDomenic Polite MD.  [facility] 12/05/2015, 4:38 PM

## 2016-02-27 DIAGNOSIS — E559 Vitamin D deficiency, unspecified: Secondary | ICD-10-CM

## 2016-02-27 HISTORY — DX: Vitamin D deficiency, unspecified: E55.9

## 2016-04-09 DIAGNOSIS — I5032 Chronic diastolic (congestive) heart failure: Secondary | ICD-10-CM | POA: Diagnosis not present

## 2016-04-09 DIAGNOSIS — I2510 Atherosclerotic heart disease of native coronary artery without angina pectoris: Secondary | ICD-10-CM | POA: Diagnosis not present

## 2016-04-09 DIAGNOSIS — E782 Mixed hyperlipidemia: Secondary | ICD-10-CM | POA: Diagnosis not present

## 2016-04-09 DIAGNOSIS — F17200 Nicotine dependence, unspecified, uncomplicated: Secondary | ICD-10-CM | POA: Diagnosis not present

## 2016-04-13 DIAGNOSIS — J449 Chronic obstructive pulmonary disease, unspecified: Secondary | ICD-10-CM | POA: Diagnosis not present

## 2016-04-14 DIAGNOSIS — J449 Chronic obstructive pulmonary disease, unspecified: Secondary | ICD-10-CM | POA: Diagnosis not present

## 2016-05-05 DIAGNOSIS — L821 Other seborrheic keratosis: Secondary | ICD-10-CM | POA: Diagnosis not present

## 2016-05-05 DIAGNOSIS — L304 Erythema intertrigo: Secondary | ICD-10-CM | POA: Diagnosis not present

## 2016-05-05 DIAGNOSIS — L820 Inflamed seborrheic keratosis: Secondary | ICD-10-CM | POA: Diagnosis not present

## 2016-06-17 DIAGNOSIS — I2510 Atherosclerotic heart disease of native coronary artery without angina pectoris: Secondary | ICD-10-CM | POA: Diagnosis not present

## 2016-06-17 DIAGNOSIS — J431 Panlobular emphysema: Secondary | ICD-10-CM | POA: Diagnosis not present

## 2016-06-17 DIAGNOSIS — E119 Type 2 diabetes mellitus without complications: Secondary | ICD-10-CM | POA: Diagnosis not present

## 2016-06-17 DIAGNOSIS — Z79899 Other long term (current) drug therapy: Secondary | ICD-10-CM | POA: Diagnosis not present

## 2016-06-17 DIAGNOSIS — E784 Other hyperlipidemia: Secondary | ICD-10-CM | POA: Diagnosis not present

## 2016-06-17 DIAGNOSIS — F320 Major depressive disorder, single episode, mild: Secondary | ICD-10-CM | POA: Diagnosis not present

## 2016-06-17 DIAGNOSIS — Z6841 Body Mass Index (BMI) 40.0 and over, adult: Secondary | ICD-10-CM | POA: Diagnosis not present

## 2016-06-17 DIAGNOSIS — I5032 Chronic diastolic (congestive) heart failure: Secondary | ICD-10-CM | POA: Diagnosis not present

## 2016-07-15 DIAGNOSIS — L820 Inflamed seborrheic keratosis: Secondary | ICD-10-CM | POA: Diagnosis not present

## 2016-08-19 DIAGNOSIS — F17200 Nicotine dependence, unspecified, uncomplicated: Secondary | ICD-10-CM | POA: Diagnosis not present

## 2016-08-19 DIAGNOSIS — I2510 Atherosclerotic heart disease of native coronary artery without angina pectoris: Secondary | ICD-10-CM | POA: Diagnosis not present

## 2016-08-19 DIAGNOSIS — I5032 Chronic diastolic (congestive) heart failure: Secondary | ICD-10-CM | POA: Diagnosis not present

## 2016-08-19 DIAGNOSIS — E782 Mixed hyperlipidemia: Secondary | ICD-10-CM | POA: Diagnosis not present

## 2016-09-09 DIAGNOSIS — Z6841 Body Mass Index (BMI) 40.0 and over, adult: Secondary | ICD-10-CM | POA: Diagnosis not present

## 2016-09-09 DIAGNOSIS — I5032 Chronic diastolic (congestive) heart failure: Secondary | ICD-10-CM | POA: Diagnosis not present

## 2016-09-09 DIAGNOSIS — G4736 Sleep related hypoventilation in conditions classified elsewhere: Secondary | ICD-10-CM | POA: Diagnosis not present

## 2016-09-09 DIAGNOSIS — J431 Panlobular emphysema: Secondary | ICD-10-CM | POA: Diagnosis not present

## 2016-09-09 DIAGNOSIS — J439 Emphysema, unspecified: Secondary | ICD-10-CM | POA: Diagnosis not present

## 2016-09-22 ENCOUNTER — Ambulatory Visit (INDEPENDENT_AMBULATORY_CARE_PROVIDER_SITE_OTHER): Payer: Self-pay | Admitting: Orthopedic Surgery

## 2016-09-24 DIAGNOSIS — J431 Panlobular emphysema: Secondary | ICD-10-CM | POA: Diagnosis not present

## 2016-09-24 DIAGNOSIS — I5032 Chronic diastolic (congestive) heart failure: Secondary | ICD-10-CM | POA: Diagnosis not present

## 2016-09-24 DIAGNOSIS — E119 Type 2 diabetes mellitus without complications: Secondary | ICD-10-CM | POA: Diagnosis not present

## 2016-09-24 DIAGNOSIS — J40 Bronchitis, not specified as acute or chronic: Secondary | ICD-10-CM | POA: Diagnosis not present

## 2016-09-24 DIAGNOSIS — Z6841 Body Mass Index (BMI) 40.0 and over, adult: Secondary | ICD-10-CM | POA: Diagnosis not present

## 2016-10-29 DIAGNOSIS — Z0000 Encounter for general adult medical examination without abnormal findings: Secondary | ICD-10-CM | POA: Diagnosis not present

## 2016-10-29 DIAGNOSIS — J431 Panlobular emphysema: Secondary | ICD-10-CM | POA: Diagnosis not present

## 2016-10-29 DIAGNOSIS — I5032 Chronic diastolic (congestive) heart failure: Secondary | ICD-10-CM | POA: Diagnosis not present

## 2016-10-29 DIAGNOSIS — R5383 Other fatigue: Secondary | ICD-10-CM | POA: Diagnosis not present

## 2016-10-29 DIAGNOSIS — I2510 Atherosclerotic heart disease of native coronary artery without angina pectoris: Secondary | ICD-10-CM | POA: Diagnosis not present

## 2016-10-29 DIAGNOSIS — R4189 Other symptoms and signs involving cognitive functions and awareness: Secondary | ICD-10-CM | POA: Diagnosis not present

## 2016-10-29 DIAGNOSIS — Z79899 Other long term (current) drug therapy: Secondary | ICD-10-CM | POA: Diagnosis not present

## 2016-10-29 DIAGNOSIS — R5381 Other malaise: Secondary | ICD-10-CM | POA: Diagnosis not present

## 2016-10-29 DIAGNOSIS — E559 Vitamin D deficiency, unspecified: Secondary | ICD-10-CM | POA: Diagnosis not present

## 2016-10-29 DIAGNOSIS — E119 Type 2 diabetes mellitus without complications: Secondary | ICD-10-CM | POA: Diagnosis not present

## 2016-11-24 DIAGNOSIS — Z1231 Encounter for screening mammogram for malignant neoplasm of breast: Secondary | ICD-10-CM | POA: Diagnosis not present

## 2016-12-22 ENCOUNTER — Encounter: Payer: Self-pay | Admitting: Cardiology

## 2016-12-22 ENCOUNTER — Ambulatory Visit (INDEPENDENT_AMBULATORY_CARE_PROVIDER_SITE_OTHER): Payer: PPO | Admitting: Cardiology

## 2016-12-22 VITALS — BP 134/70 | HR 61 | Ht 63.0 in | Wt 254.8 lb

## 2016-12-22 DIAGNOSIS — I5032 Chronic diastolic (congestive) heart failure: Secondary | ICD-10-CM

## 2016-12-22 DIAGNOSIS — E782 Mixed hyperlipidemia: Secondary | ICD-10-CM | POA: Diagnosis not present

## 2016-12-22 DIAGNOSIS — I2510 Atherosclerotic heart disease of native coronary artery without angina pectoris: Secondary | ICD-10-CM | POA: Diagnosis not present

## 2016-12-22 DIAGNOSIS — F17200 Nicotine dependence, unspecified, uncomplicated: Secondary | ICD-10-CM

## 2016-12-22 DIAGNOSIS — R0789 Other chest pain: Secondary | ICD-10-CM | POA: Diagnosis not present

## 2016-12-22 MED ORDER — RANOLAZINE ER 500 MG PO TB12: 500.0000 mg | ORAL_TABLET | Freq: Two times a day (BID) | ORAL | 3 refills | Status: DC

## 2016-12-22 NOTE — Addendum Note (Signed)
Addended by: Kathyrn Sheriff on: 12/22/2016 03:38 PM   Modules accepted: Orders

## 2016-12-22 NOTE — Addendum Note (Signed)
Addended by: Kathyrn Sheriff on: 12/22/2016 03:52 PM   Modules accepted: Orders

## 2016-12-22 NOTE — Patient Instructions (Addendum)
Medication Instructions:  Your physician has recommended you make the following change in your medication:  1.) HOLD your Crestor for 1 week. Should you not notice a change in your symptoms please start back and contact your primary care doctor.   Labwork: None   Testing/Procedures: EKG today in office  Follow-Up: Your physician wants you to follow-up in: 6 months. You will receive a reminder letter in the mail two months in advance. If you don't receive a letter, please call our office to schedule the follow-up appointment.  Any Other Special Instructions Will Be Listed Below (If Applicable).  Please note that any paperwork needing to be filled out by the provider will need to be addressed at the front desk prior to seeing the provider. Please note that any paperwork FMLA, Disability or other documents regarding health condition is subject to a $25.00 charge that must be received prior to completion of paperwork in the form of a money order or check.     If you need a refill on your cardiac medications before your next appointment, please call your pharmacy.

## 2016-12-22 NOTE — Progress Notes (Signed)
Cardiology Office Note:    Date:  12/22/2016   ID:  Danielle [name], DOB Sep 14, 1933, MRN [id number]  PCP:  [name], MD  Cardiologist:  [name], MD    Referring MD: [name], MD   Chief Complaint  Patient presents with  . Follow-up    Routine Check up, has stopped somking and has gained 10lbs   she is upset about her weight  History of Present Illness:    Danielle [name] is a 83 y.o. female  With coronary artery disease. Denies having any chest pain. Finally she quit smoking after being treated for pneumonia. She is disappointed with the fact that she gained 10 pounds since last time. Denies having any chest pain tightness squeezing pressure burning chest. She complains of having some muscle aches and joint aches because of cholesterol medication. I talked to her about potentially using coenzyme Q10 but she said she does not want to have any additional medications. I asked her to hold her statin for about week to see there is any difference. If not asked her to go back on Crestor if he has we may switch her to different statin. We'll do EKG today to check the baseline. I see her back in my office in 6 months  Past Medical History:  Diagnosis Date  . Anginal pain ([place])   . Arthritis   . Asthma   . CHF (congestive heart failure) ([place])   . COPD (chronic obstructive pulmonary disease) ([place])   . Coronary artery disease   . Depression   . GERD (gastroesophageal reflux disease)   . Headache   . History of hiatal hernia   . Hyperlipidemia   . Hypertension   . Insomnia   . Osteoporosis   . Pneumonia 2013  . PONV (postoperative nausea and vomiting)   . Shortness of breath dyspnea    with exertion    Past Surgical History:  Procedure Laterality Date  . ABDOMINAL HYSTERECTOMY    . APPENDECTOMY    . BREAST SURGERY  40 yrs. ago   growth removed in each breast-benign  . CARDIAC CATHETERIZATION     12/14/12 LHC ([facility]): few mild stenotic areas max 40% of  ectatic RCA o/w NL coronaries, EF 65%. Med tx.  Marland Kitchen CATARACT EXTRACTION W/ INTRAOCULAR LENS  IMPLANT, BILATERAL Bilateral 15 yrs ago  . CHOLECYSTECTOMY    . FOOT SURGERY    . LAPAROSCOPIC APPENDECTOMY N/A 02/19/2015   Procedure: EXCISION OF MESENTERIC MASS;  Surgeon: Stark Klein, MD;  Location: [facility];  Service: General;  Laterality: N/A;  . REPLACEMENT TOTAL KNEE BILATERAL      Current Medications: Current Meds  Medication Sig  . albuterol (PROVENTIL HFA;VENTOLIN HFA) 108 (90 BASE) MCG/ACT inhaler Inhale 2 puffs into the lungs every 6 (six) hours as needed.  Marland Kitchen aspirin 81 MG tablet Take 81 mg by mouth daily.  . budesonide-formoterol (SYMBICORT) 160-4.5 MCG/ACT inhaler Inhale 2 puffs into the lungs 2 (two) times daily.  . citalopram (CELEXA) 20 MG tablet Take 20 mg by mouth daily.  . clonazePAM (KLONOPIN) 1 MG tablet Take 1 mg by mouth as needed (sleep).   . furosemide (LASIX) 20 MG tablet Take 20 mg by mouth 2 (two) times daily.   . metoprolol succinate (TOPROL-XL) 25 MG 24 hr tablet Take 25 mg by mouth daily.  . pantoprazole (PROTONIX) 40 MG tablet Take 40 mg by mouth 2 (two) times daily.  . potassium chloride (MICRO-K) 10 MEQ CR capsule  Take 10 mEq by mouth daily.  . ranolazine (RANEXA) 500 MG 12 hr tablet Take 1 tablet by mouth 2 (two) times daily.  . rosuvastatin (CRESTOR) 40 MG tablet Take 40 mg by mouth daily.  . Vitamin D, Ergocalciferol, (DRISDOL) 50000 UNITS CAPS capsule Take 50,000 Units by mouth every 7 (seven) days. No specific days     Allergies:   Codeine; Levofloxacin; Penicillins; and Tape   Social History   Social History  . Marital status: Married    Spouse name: N/A  . Number of children: N/A  . Years of education: N/A   Social History Main Topics  . Smoking status: Former Smoker    Packs/day: 0.25    Types: Cigarettes    Quit date: 08/21/2016  . Smokeless tobacco: Never Used  . Alcohol use No  . Drug use: No  . Sexual activity: Not Asked   Other Topics  Concern  . None   Social History Narrative  . None     Family History: The patient's Family history is unknown by patient. ROS:   Please see the history of present illness.    All 14 point review of systems negative except as described per history of present illness  EKGs/Labs/Other Studies Reviewed:      Recent Labs: No results found for requested labs within last 8760 hours.  Recent Lipid Panel No results found for: CHOL, TRIG, HDL, CHOLHDL, VLDL, LDLCALC, LDLDIRECT  Physical Exam:    VS:  BP 134/70   Pulse 61   Ht 5\' 3"  (1.6 m)   Wt 254 lb 12.8 oz (115.6 kg)   SpO2 98%   BMI 45.14 kg/m     Wt Readings from Last 3 Encounters:  12/22/16 254 lb 12.8 oz (115.6 kg)  11/09/15 250 lb 1.6 oz (113.4 kg)  02/22/15 260 lb 12.9 oz (118.3 kg)     GEN:  Well nourished, well developed in no acute distress HEENT: Normal NECK: No JVD; No carotid bruits LYMPHATICS: No lymphadenopathy CARDIAC: RRR, no murmurs, no rubs, no gallops RESPIRATORY:  Clear to auscultation without rales, wheezing or rhonchi  ABDOMEN: Soft, non-tender, non-distended MUSCULOSKELETAL:  No edema; No deformity  SKIN: Warm and dry LOWER EXTREMITIES: no swelling NEUROLOGIC:  Alert and oriented x 3 PSYCHIATRIC:  Normal affect   ASSESSMENT:    1. Chronic diastolic congestive heart failure ([place])   2. Coronary artery disease involving native coronary artery of native heart without angina pectoris   3. Midsternal chest pain   4. Mixed hyperlipidemia   5. Smoking    PLAN:    In order of problems listed above:  1. Chronic diastolic congestive heart failure: Appears to be stable. 2. Coronary artery disease stable without chest pain. 3. Chest pain: Denies having any 4. Dyslipidemia: Plan as outlined above. 5. Smoking: She stopped a few weeks ago. I congratulated him for that and encourage her to stay away from smoking   Medication Adjustments/Labs and Tests Ordered: Current medicines are reviewed at  length with the patient today.  Concerns regarding medicines are outlined above.  No orders of the defined types were placed in this encounter.  Medication changes: No orders of the defined types were placed in this encounter.   Signed, Park Liter, MD, [facility] 12/22/2016 3:29 PM    [place]

## 2016-12-31 DIAGNOSIS — L853 Xerosis cutis: Secondary | ICD-10-CM | POA: Diagnosis not present

## 2016-12-31 DIAGNOSIS — L820 Inflamed seborrheic keratosis: Secondary | ICD-10-CM | POA: Diagnosis not present

## 2017-04-01 ENCOUNTER — Other Ambulatory Visit: Payer: Self-pay

## 2017-04-01 MED ORDER — RANOLAZINE ER 500 MG PO TB12: 500.0000 mg | ORAL_TABLET | Freq: Two times a day (BID) | ORAL | 3 refills | Status: DC

## 2017-04-23 DIAGNOSIS — J431 Panlobular emphysema: Secondary | ICD-10-CM | POA: Diagnosis not present

## 2017-05-04 DIAGNOSIS — J431 Panlobular emphysema: Secondary | ICD-10-CM | POA: Diagnosis not present

## 2017-05-04 DIAGNOSIS — Z79899 Other long term (current) drug therapy: Secondary | ICD-10-CM | POA: Diagnosis not present

## 2017-05-04 DIAGNOSIS — I2510 Atherosclerotic heart disease of native coronary artery without angina pectoris: Secondary | ICD-10-CM | POA: Diagnosis not present

## 2017-05-04 DIAGNOSIS — E782 Mixed hyperlipidemia: Secondary | ICD-10-CM | POA: Diagnosis not present

## 2017-05-04 DIAGNOSIS — I5032 Chronic diastolic (congestive) heart failure: Secondary | ICD-10-CM | POA: Diagnosis not present

## 2017-05-04 DIAGNOSIS — E119 Type 2 diabetes mellitus without complications: Secondary | ICD-10-CM | POA: Diagnosis not present

## 2017-05-04 DIAGNOSIS — M150 Primary generalized (osteo)arthritis: Secondary | ICD-10-CM | POA: Diagnosis not present

## 2017-05-05 DIAGNOSIS — J431 Panlobular emphysema: Secondary | ICD-10-CM | POA: Diagnosis not present

## 2017-05-05 DIAGNOSIS — I5032 Chronic diastolic (congestive) heart failure: Secondary | ICD-10-CM | POA: Diagnosis not present

## 2017-05-05 DIAGNOSIS — J9611 Chronic respiratory failure with hypoxia: Secondary | ICD-10-CM | POA: Insufficient documentation

## 2017-05-05 HISTORY — DX: Chronic respiratory failure with hypoxia: J96.11

## 2017-05-07 DIAGNOSIS — N183 Chronic kidney disease, stage 3 unspecified: Secondary | ICD-10-CM

## 2017-05-07 DIAGNOSIS — N1831 Chronic kidney disease, stage 3a: Secondary | ICD-10-CM | POA: Insufficient documentation

## 2017-05-07 HISTORY — DX: Chronic kidney disease, stage 3 unspecified: N18.30

## 2017-05-29 ENCOUNTER — Telehealth: Payer: Self-pay | Admitting: Cardiology

## 2017-05-29 MED ORDER — RANOLAZINE ER 500 MG PO TB12: 500.0000 mg | ORAL_TABLET | Freq: Two times a day (BID) | ORAL | 1 refills | Status: DC

## 2017-05-29 NOTE — Telephone Encounter (Signed)
Patient needs refill called in of [place] to [facility] in [place] please.

## 2017-05-29 NOTE — Telephone Encounter (Signed)
Refill sent.

## 2017-05-30 DIAGNOSIS — H52223 Regular astigmatism, bilateral: Secondary | ICD-10-CM | POA: Diagnosis not present

## 2017-05-30 DIAGNOSIS — H01001 Unspecified blepharitis right upper eyelid: Secondary | ICD-10-CM | POA: Diagnosis not present

## 2017-05-30 DIAGNOSIS — H04123 Dry eye syndrome of bilateral lacrimal glands: Secondary | ICD-10-CM | POA: Diagnosis not present

## 2017-05-30 DIAGNOSIS — H401131 Primary open-angle glaucoma, bilateral, mild stage: Secondary | ICD-10-CM | POA: Diagnosis not present

## 2017-05-30 DIAGNOSIS — H5203 Hypermetropia, bilateral: Secondary | ICD-10-CM | POA: Diagnosis not present

## 2017-06-23 ENCOUNTER — Encounter: Payer: Self-pay | Admitting: Cardiology

## 2017-06-23 ENCOUNTER — Ambulatory Visit: Payer: PPO | Admitting: Cardiology

## 2017-06-23 VITALS — BP 126/60 | HR 66 | Ht 63.0 in | Wt 256.4 lb

## 2017-06-23 DIAGNOSIS — J431 Panlobular emphysema: Secondary | ICD-10-CM

## 2017-06-23 DIAGNOSIS — I5032 Chronic diastolic (congestive) heart failure: Secondary | ICD-10-CM | POA: Diagnosis not present

## 2017-06-23 DIAGNOSIS — I2510 Atherosclerotic heart disease of native coronary artery without angina pectoris: Secondary | ICD-10-CM | POA: Diagnosis not present

## 2017-06-23 DIAGNOSIS — E782 Mixed hyperlipidemia: Secondary | ICD-10-CM | POA: Diagnosis not present

## 2017-06-23 NOTE — Progress Notes (Signed)
Cardiology Office Note:    Date:  06/23/2017   ID:  Danielle [name], DOB 1934/03/30, MRN [id number]  PCP:  [name], MD  Cardiologist:  [name], MD    Referring MD: [name], MD   Chief Complaint  Patient presents with  . Follow-up  Complaining of being weak and tired  History of Present Illness:    Danielle [name] is a 84 y.o. female with coronary artery disease chronic diastolic congestive heart failure and emphysema.  She complained of not feeling well tired exhausted short of breath also described to have some swelling of lower extremities however on my physical examination I do not see any.  Past Medical History:  Diagnosis Date  . Anginal pain ([place])   . Arthritis   . Asthma   . CHF (congestive heart failure) ([place])   . COPD (chronic obstructive pulmonary disease) ([place])   . Coronary artery disease   . Depression   . GERD (gastroesophageal reflux disease)   . Headache   . History of hiatal hernia   . Hyperlipidemia   . Hypertension   . Insomnia   . Osteoporosis   . Pneumonia 2013  . PONV (postoperative nausea and vomiting)   . Shortness of breath dyspnea    with exertion    Past Surgical History:  Procedure Laterality Date  . ABDOMINAL HYSTERECTOMY    . APPENDECTOMY    . BREAST SURGERY  40 yrs. ago   growth removed in each breast-benign  . CARDIAC CATHETERIZATION     12/14/12 LHC ([facility]): few mild stenotic areas max 40% of ectatic RCA o/w NL coronaries, EF 65%. Med tx.  Marland Kitchen CATARACT EXTRACTION W/ INTRAOCULAR LENS  IMPLANT, BILATERAL Bilateral 15 yrs ago  . CHOLECYSTECTOMY    . FOOT SURGERY    . LAPAROSCOPIC APPENDECTOMY N/A 02/19/2015   Procedure: EXCISION OF MESENTERIC MASS;  Surgeon: Stark Klein, MD;  Location: [place];  Service: General;  Laterality: N/A;  . REPLACEMENT TOTAL KNEE BILATERAL      Current Medications: Current Meds  Medication Sig  . albuterol (PROVENTIL HFA;VENTOLIN HFA) 108 (90 BASE) MCG/ACT inhaler Inhale 2  puffs into the lungs every 6 (six) hours as needed.  Marland Kitchen aspirin 81 MG tablet Take 81 mg by mouth daily.  . budesonide-formoterol (SYMBICORT) 160-4.5 MCG/ACT inhaler Inhale 2 puffs into the lungs 2 (two) times daily.  . citalopram (CELEXA) 20 MG tablet Take 20 mg by mouth daily.  . clonazePAM (KLONOPIN) 1 MG tablet Take 1 mg by mouth as needed (sleep).   . furosemide (LASIX) 20 MG tablet Take 20 mg by mouth 2 (two) times daily.   . metoprolol succinate (TOPROL-XL) 25 MG 24 hr tablet Take 25 mg by mouth daily.  . pantoprazole (PROTONIX) 40 MG tablet Take 40 mg by mouth 2 (two) times daily.  . potassium chloride (MICRO-K) 10 MEQ CR capsule Take 10 mEq by mouth daily.  . ranolazine (RANEXA) 500 MG 12 hr tablet Take 1 tablet (500 mg total) by mouth 2 (two) times daily.  . rosuvastatin (CRESTOR) 40 MG tablet Take 40 mg by mouth daily.  . Vitamin D, Ergocalciferol, (DRISDOL) 50000 UNITS CAPS capsule Take 50,000 Units by mouth every 7 (seven) days. No specific days     Allergies:   Codeine; Levofloxacin; Penicillins; and Tape   Social History   Socioeconomic History  . Marital status: Married    Spouse name: Not on file  . Number of children: Not on file  .  Years of education: Not on file  . Highest education level: Not on file  Occupational History  . Not on file  Social Needs  . Financial resource strain: Not on file  . Food insecurity:    Worry: Not on file    Inability: Not on file  . Transportation needs:    Medical: Not on file    Non-medical: Not on file  Tobacco Use  . Smoking status: Former Smoker    Packs/day: 0.25    Types: Cigarettes    Last attempt to quit: 08/21/2016    Years since quitting: 0.8  . Smokeless tobacco: Never Used  Substance and Sexual Activity  . Alcohol use: No  . Drug use: No  . Sexual activity: Not on file  Lifestyle  . Physical activity:    Days per week: Not on file    Minutes per session: Not on file  . Stress: Not on file  Relationships  .  Social connections:    Talks on phone: Not on file    Gets together: Not on file    Attends religious service: Not on file    Active member of club or organization: Not on file    Attends meetings of clubs or organizations: Not on file    Relationship status: Not on file  Other Topics Concern  . Not on file  Social History Narrative  . Not on file     Family History: The patient's Family history is unknown by patient. ROS:   Please see the history of present illness.    All 14 point review of systems negative except as described per history of present illness  EKGs/Labs/Other Studies Reviewed:      Recent Labs: No results found for requested labs within last 8760 hours.  Recent Lipid Panel No results found for: CHOL, TRIG, HDL, CHOLHDL, VLDL, LDLCALC, LDLDIRECT  Physical Exam:    VS:  BP 126/60   Pulse 66   Ht 5\' 3"  (1.6 m)   Wt 256 lb 6.4 oz (116.3 kg)   SpO2 94%   BMI 45.42 kg/m     Wt Readings from Last 3 Encounters:  06/23/17 256 lb 6.4 oz (116.3 kg)  12/22/16 254 lb 12.8 oz (115.6 kg)  11/09/15 250 lb 1.6 oz (113.4 kg)     GEN:  Well nourished, well developed in no acute distress HEENT: Normal NECK: No JVD; No carotid bruits LYMPHATICS: No lymphadenopathy CARDIAC: RRR, no murmurs, no rubs, no gallops RESPIRATORY:  Clear to auscultation without rales, wheezing or rhonchi  ABDOMEN: Soft, non-tender, non-distended MUSCULOSKELETAL:  No edema; No deformity  SKIN: Warm and dry LOWER EXTREMITIES: no swelling NEUROLOGIC:  Alert and oriented x 3 PSYCHIATRIC:  Normal affect   ASSESSMENT:    1. Chronic diastolic congestive heart failure ([place])   2. Coronary artery disease involving native coronary artery of native heart without angina pectoris   3. Panlobular emphysema ([place])   4. Mixed hyperlipidemia    PLAN:    In order of problems listed above:  1. Chronic diastolic congestive heart failure we will check Chem-7 as well as proBNP today.  I will also  schedule him to have echocardiogram to reassess her left ventricular ejection fraction.  She prefers to have it done in [facility]. 2. Artery disease: Appears to be stable 3. Emphysema: Followed by internal medicine team. 4. Mixed dyslipidemia I will continue with Crestor we will call primary care physician to get fasting lipid profile.  Medication Adjustments/Labs and Tests Ordered: Current medicines are reviewed at length with the patient today.  Concerns regarding medicines are outlined above.  No orders of the defined types were placed in this encounter.  Medication changes: No orders of the defined types were placed in this encounter.   Signed, Park Liter, MD, [facility] 06/23/2017 4:40 PM    [facility]

## 2017-06-23 NOTE — Patient Instructions (Signed)
Medication Instructions:  Your physician recommends that you continue on your current medications as directed. Please refer to the Current Medication list given to you today.  Labwork: Your physician recommends that you have the following labs drawn: BMP and BNP  Testing/Procedures: Your physician has requested that you have an echocardiogram. Echocardiography is a painless test that uses sound waves to create images of your heart. It provides your doctor with information about the size and shape of your heart and how well your heart's chambers and valves are working. This procedure takes approximately one hour. There are no restrictions for this procedure.  Follow-Up: Your physician recommends that you schedule a follow-up appointment in: 4 months   Any Other Special Instructions Will Be Listed Below (If Applicable).     If you need a refill on your cardiac medications before your next appointment, please call your pharmacy.   [place], RN, BSN

## 2017-06-23 NOTE — Addendum Note (Signed)
Addended by: Mattie Marlin on: 06/23/2017 05:01 PM   Modules accepted: Orders

## 2017-06-24 LAB — BASIC METABOLIC PANEL
BUN / CREAT RATIO: 24 (ref 12–28)
BUN: 21 mg/dL (ref 8–27)
CALCIUM: 9.6 mg/dL (ref 8.7–10.3)
CHLORIDE: 100 mmol/L (ref 96–106)
CO2: 20 mmol/L (ref 20–29)
Creatinine, Ser: 0.89 mg/dL (ref 0.57–1.00)
GFR calc Af Amer: 70 mL/min/{1.73_m2} (ref >59)
GFR calc non Af Amer: 61 mL/min/{1.73_m2} (ref >59)
GLUCOSE: 124 mg/dL — AB (ref 65–99)
Potassium: 4.5 mmol/L (ref 3.5–5.2)
Sodium: 144 mmol/L (ref 134–144)

## 2017-06-24 LAB — PRO B NATRIURETIC PEPTIDE: NT-PRO BNP: 155 pg/mL (ref 0–738)

## 2017-07-01 DIAGNOSIS — R350 Frequency of micturition: Secondary | ICD-10-CM | POA: Diagnosis not present

## 2017-07-01 DIAGNOSIS — N3000 Acute cystitis without hematuria: Secondary | ICD-10-CM | POA: Diagnosis not present

## 2017-07-01 DIAGNOSIS — I5032 Chronic diastolic (congestive) heart failure: Secondary | ICD-10-CM | POA: Diagnosis not present

## 2017-07-06 ENCOUNTER — Telehealth: Payer: Self-pay | Admitting: Cardiology

## 2017-07-06 NOTE — Telephone Encounter (Signed)
Please call [name] about her mother Mckay

## 2017-07-06 NOTE — Telephone Encounter (Signed)
Patient's daughter, Janace Hoard, called asking for the results of her echocardiogram. Informed patient that we are awaiting results and that I would call her as soon as I had results. [name] also stated that her mom had an episode of chest pain over the weekend but the patient refused to go to the emergency room and said that the pain eventually went away. She couldn't recall how long the pain lasted as she was at work and could not ask patient. Daughter states that patient has nitroglycerin. Advised daughter that if patient has another episode of chest pain to have her take nitroglycerin as prescribed and if the pain isn't resolved that it is very important that she get to the emergency room to be evaluated. [name] verbalized understanding. No further questions.

## 2017-07-09 ENCOUNTER — Telehealth: Payer: Self-pay | Admitting: *Deleted

## 2017-07-09 ENCOUNTER — Other Ambulatory Visit: Payer: Self-pay | Admitting: Cardiology

## 2017-07-09 ENCOUNTER — Other Ambulatory Visit: Payer: Self-pay | Admitting: *Deleted

## 2017-07-09 DIAGNOSIS — I2510 Atherosclerotic heart disease of native coronary artery without angina pectoris: Secondary | ICD-10-CM

## 2017-07-09 DIAGNOSIS — I5032 Chronic diastolic (congestive) heart failure: Secondary | ICD-10-CM

## 2017-07-09 NOTE — Telephone Encounter (Signed)
Patient's daughter, Janace Hoard, informed of echocardiogram results.

## 2017-07-09 NOTE — Addendum Note (Signed)
Addended by: Austin Miles on: 07/09/2017 01:29 PM   Modules accepted: Orders

## 2017-09-30 ENCOUNTER — Encounter: Payer: Self-pay | Admitting: Cardiology

## 2017-09-30 ENCOUNTER — Ambulatory Visit: Payer: PPO | Admitting: Cardiology

## 2017-09-30 VITALS — BP 124/70 | HR 61 | Ht 63.0 in | Wt 262.0 lb

## 2017-09-30 DIAGNOSIS — E782 Mixed hyperlipidemia: Secondary | ICD-10-CM | POA: Diagnosis not present

## 2017-09-30 DIAGNOSIS — J431 Panlobular emphysema: Secondary | ICD-10-CM

## 2017-09-30 DIAGNOSIS — I5032 Chronic diastolic (congestive) heart failure: Secondary | ICD-10-CM

## 2017-09-30 DIAGNOSIS — I2510 Atherosclerotic heart disease of native coronary artery without angina pectoris: Secondary | ICD-10-CM

## 2017-09-30 NOTE — Patient Instructions (Signed)
Medication Instructions:  Your physician recommends that you continue on your current medications as directed. Please refer to the Current Medication list given to you today.   Labwork: None  Testing/Procedures: None  Follow-Up: Your physician recommends that you schedule a follow-up appointment in: 3 months  Any Other Special Instructions Will Be Listed Below (If Applicable).     If you need a refill on your cardiac medications before your next appointment, please call your pharmacy.   

## 2017-09-30 NOTE — Progress Notes (Signed)
Cardiology Office Note:    Date:  09/30/2017   ID:  Danielle [name], DOB 1934-03-28, MRN [id number]  PCP:  [name], MD  Cardiologist:  [name], MD    Referring MD: [name], MD   No chief complaint on file. Complain of being weak and tired  History of Present Illness:    Danielle [name] is a 84 y.o. female with multiple medical issues.  Diastolic dysfunction however seems to be compensated describe of having weakness and fatigue.  She is upset with herself because she gained some weight she quit smoking and I congratulated her for it.  Her diabetes is fairly controlled she does not do much therefore we spent our visit talking about needs to exercise on the regular basis.  I want her to walk every single day at least twice to 3 times a day during hot days inside the office.  Past Medical History:  Diagnosis Date  . Anginal pain ([place])   . Arthritis   . Asthma   . CHF (congestive heart failure) ([place])   . COPD (chronic obstructive pulmonary disease) ([place])   . Coronary artery disease   . Depression   . GERD (gastroesophageal reflux disease)   . Headache   . History of hiatal hernia   . Hyperlipidemia   . Hypertension   . Insomnia   . Osteoporosis   . Pneumonia 2013  . PONV (postoperative nausea and vomiting)   . Shortness of breath dyspnea    with exertion    Past Surgical History:  Procedure Laterality Date  . ABDOMINAL HYSTERECTOMY    . APPENDECTOMY    . BREAST SURGERY  40 yrs. ago   growth removed in each breast-benign  . CARDIAC CATHETERIZATION     12/14/12 LHC ([facility]): few mild stenotic areas max 40% of ectatic RCA o/w NL coronaries, EF 65%. Med tx.  Marland Kitchen CATARACT EXTRACTION W/ INTRAOCULAR LENS  IMPLANT, BILATERAL Bilateral 15 yrs ago  . CHOLECYSTECTOMY    . FOOT SURGERY    . LAPAROSCOPIC APPENDECTOMY N/A 02/19/2015   Procedure: EXCISION OF MESENTERIC MASS;  Surgeon: Stark Klein, MD;  Location: [place];  Service: General;  Laterality: N/A;    . REPLACEMENT TOTAL KNEE BILATERAL      Current Medications: Current Meds  Medication Sig  . albuterol (PROVENTIL HFA;VENTOLIN HFA) 108 (90 BASE) MCG/ACT inhaler Inhale 2 puffs into the lungs every 6 (six) hours as needed.  Marland Kitchen aspirin 81 MG tablet Take 81 mg by mouth daily.  . budesonide-formoterol (SYMBICORT) 160-4.5 MCG/ACT inhaler Inhale 2 puffs into the lungs 2 (two) times daily.  . citalopram (CELEXA) 20 MG tablet Take 20 mg by mouth daily.  . clonazePAM (KLONOPIN) 1 MG tablet Take 1 mg by mouth as needed (sleep).   . furosemide (LASIX) 20 MG tablet Take 20 mg by mouth 2 (two) times daily.   . metoprolol succinate (TOPROL-XL) 25 MG 24 hr tablet Take 25 mg by mouth daily.  . pantoprazole (PROTONIX) 40 MG tablet Take 40 mg by mouth 2 (two) times daily.  . potassium chloride (MICRO-K) 10 MEQ CR capsule Take 10 mEq by mouth daily.  . ranolazine (RANEXA) 500 MG 12 hr tablet Take 1 tablet (500 mg total) by mouth 2 (two) times daily.  . rosuvastatin (CRESTOR) 40 MG tablet Take 40 mg by mouth daily.  . Vitamin D, Ergocalciferol, (DRISDOL) 50000 UNITS CAPS capsule Take 50,000 Units by mouth every 7 (seven) days. No specific days  Allergies:   Codeine; Levofloxacin; Penicillins; and Tape   Social History   Socioeconomic History  . Marital status: Married    Spouse name: Not on file  . Number of children: Not on file  . Years of education: Not on file  . Highest education level: Not on file  Occupational History  . Not on file  Social Needs  . Financial resource strain: Not on file  . Food insecurity:    Worry: Not on file    Inability: Not on file  . Transportation needs:    Medical: Not on file    Non-medical: Not on file  Tobacco Use  . Smoking status: Former Smoker    Packs/day: 0.25    Types: Cigarettes    Last attempt to quit: 08/21/2016    Years since quitting: 1.1  . Smokeless tobacco: Never Used  Substance and Sexual Activity  . Alcohol use: No  . Drug use: No   . Sexual activity: Not on file  Lifestyle  . Physical activity:    Days per week: Not on file    Minutes per session: Not on file  . Stress: Not on file  Relationships  . Social connections:    Talks on phone: Not on file    Gets together: Not on file    Attends religious service: Not on file    Active member of club or organization: Not on file    Attends meetings of clubs or organizations: Not on file    Relationship status: Not on file  Other Topics Concern  . Not on file  Social History Narrative  . Not on file     Family History: The patient's Family history is unknown by patient. ROS:   Please see the history of present illness.    All 14 point review of systems negative except as described per history of present illness  EKGs/Labs/Other Studies Reviewed:      Recent Labs: 06/23/2017: BUN 21; Creatinine, Ser 0.89; NT-Pro BNP 155; Potassium 4.5; Sodium 144  Recent Lipid Panel No results found for: CHOL, TRIG, HDL, CHOLHDL, VLDL, LDLCALC, LDLDIRECT  Physical Exam:    VS:  BP 124/70 (BP Location: Right Arm, Patient Position: Sitting, Cuff Size: Normal)   Pulse 61   Ht 5\' 3"  (1.6 m)   Wt 262 lb (118.8 kg)   SpO2 95%   BMI 46.41 kg/m     Wt Readings from Last 3 Encounters:  09/30/17 262 lb (118.8 kg)  06/23/17 256 lb 6.4 oz (116.3 kg)  12/22/16 254 lb 12.8 oz (115.6 kg)     GEN:  Well nourished, well developed in no acute distress HEENT: Normal NECK: No JVD; No carotid bruits LYMPHATICS: No lymphadenopathy CARDIAC: RRR, no murmurs, no rubs, no gallops RESPIRATORY:  Clear to auscultation without rales, wheezing or rhonchi  ABDOMEN: Soft, non-tender, non-distended MUSCULOSKELETAL:  No edema; No deformity  SKIN: Warm and dry LOWER EXTREMITIES: no swelling NEUROLOGIC:  Alert and oriented x 3 PSYCHIATRIC:  Normal affect   ASSESSMENT:    1. Chronic diastolic congestive heart failure ([place])   2. Coronary artery disease involving native coronary artery of  native heart without angina pectoris   3. Panlobular emphysema ([place])   4. Mixed hyperlipidemia    PLAN:    In order of problems listed above:  1. Chronic diastolic congestive heart failure compensated on appropriate medications I will continue.  To review her proBNP from last March and was normal. 2. Coronary artery disease  stable on appropriate medications which I will continue 3. Emphysema: Doing well from that point to be followed by pulmonary 4. Dyslipidemia awaiting fasting lipid profile.  Overall cardiac wise she is doing fine she did have 2 episodes of chest pain within.  Of last 6 months that required nitroglycerin I told her the frequency of those episodes will increase to let me know.   Medication Adjustments/Labs and Tests Ordered: Current medicines are reviewed at length with the patient today.  Concerns regarding medicines are outlined above.  No orders of the defined types were placed in this encounter.  Medication changes: No orders of the defined types were placed in this encounter.   Signed, Park Liter, MD, [facility] 09/30/2017 10:39 AM    [place]

## 2017-10-20 DIAGNOSIS — N183 Chronic kidney disease, stage 3 (moderate): Secondary | ICD-10-CM | POA: Diagnosis not present

## 2017-10-20 DIAGNOSIS — J431 Panlobular emphysema: Secondary | ICD-10-CM | POA: Diagnosis not present

## 2017-10-20 DIAGNOSIS — J9611 Chronic respiratory failure with hypoxia: Secondary | ICD-10-CM | POA: Diagnosis not present

## 2017-10-20 DIAGNOSIS — Z6841 Body Mass Index (BMI) 40.0 and over, adult: Secondary | ICD-10-CM | POA: Diagnosis not present

## 2017-11-05 DIAGNOSIS — J4520 Mild intermittent asthma, uncomplicated: Secondary | ICD-10-CM | POA: Diagnosis not present

## 2017-11-05 DIAGNOSIS — I509 Heart failure, unspecified: Secondary | ICD-10-CM | POA: Diagnosis not present

## 2017-11-05 DIAGNOSIS — J439 Emphysema, unspecified: Secondary | ICD-10-CM | POA: Diagnosis not present

## 2017-11-11 DIAGNOSIS — I2510 Atherosclerotic heart disease of native coronary artery without angina pectoris: Secondary | ICD-10-CM | POA: Diagnosis not present

## 2017-11-11 DIAGNOSIS — J431 Panlobular emphysema: Secondary | ICD-10-CM | POA: Diagnosis not present

## 2017-11-11 DIAGNOSIS — M5136 Other intervertebral disc degeneration, lumbar region: Secondary | ICD-10-CM | POA: Diagnosis not present

## 2017-11-11 DIAGNOSIS — R269 Unspecified abnormalities of gait and mobility: Secondary | ICD-10-CM | POA: Diagnosis not present

## 2017-11-11 DIAGNOSIS — Z79899 Other long term (current) drug therapy: Secondary | ICD-10-CM | POA: Diagnosis not present

## 2017-11-11 DIAGNOSIS — K219 Gastro-esophageal reflux disease without esophagitis: Secondary | ICD-10-CM

## 2017-11-11 DIAGNOSIS — F320 Major depressive disorder, single episode, mild: Secondary | ICD-10-CM | POA: Diagnosis not present

## 2017-11-11 DIAGNOSIS — M150 Primary generalized (osteo)arthritis: Secondary | ICD-10-CM | POA: Diagnosis not present

## 2017-11-11 DIAGNOSIS — Z9181 History of falling: Secondary | ICD-10-CM

## 2017-11-11 DIAGNOSIS — J9611 Chronic respiratory failure with hypoxia: Secondary | ICD-10-CM | POA: Diagnosis not present

## 2017-11-11 DIAGNOSIS — E1122 Type 2 diabetes mellitus with diabetic chronic kidney disease: Secondary | ICD-10-CM | POA: Diagnosis not present

## 2017-11-11 DIAGNOSIS — Z0000 Encounter for general adult medical examination without abnormal findings: Secondary | ICD-10-CM | POA: Diagnosis not present

## 2017-11-11 DIAGNOSIS — I5032 Chronic diastolic (congestive) heart failure: Secondary | ICD-10-CM | POA: Diagnosis not present

## 2017-11-11 DIAGNOSIS — J310 Chronic rhinitis: Secondary | ICD-10-CM | POA: Diagnosis not present

## 2017-11-11 DIAGNOSIS — E782 Mixed hyperlipidemia: Secondary | ICD-10-CM | POA: Diagnosis not present

## 2017-11-11 DIAGNOSIS — N183 Chronic kidney disease, stage 3 (moderate): Secondary | ICD-10-CM | POA: Diagnosis not present

## 2017-11-11 HISTORY — DX: Gastro-esophageal reflux disease without esophagitis: K21.9

## 2017-11-11 HISTORY — DX: History of falling: Z91.81

## 2017-11-18 DIAGNOSIS — S8001XA Contusion of right knee, initial encounter: Secondary | ICD-10-CM | POA: Diagnosis not present

## 2017-11-18 DIAGNOSIS — S40011A Contusion of right shoulder, initial encounter: Secondary | ICD-10-CM | POA: Diagnosis not present

## 2017-11-18 DIAGNOSIS — S40021A Contusion of right upper arm, initial encounter: Secondary | ICD-10-CM | POA: Diagnosis not present

## 2017-12-06 DIAGNOSIS — J4520 Mild intermittent asthma, uncomplicated: Secondary | ICD-10-CM | POA: Diagnosis not present

## 2017-12-06 DIAGNOSIS — I509 Heart failure, unspecified: Secondary | ICD-10-CM | POA: Diagnosis not present

## 2017-12-06 DIAGNOSIS — J439 Emphysema, unspecified: Secondary | ICD-10-CM | POA: Diagnosis not present

## 2017-12-27 ENCOUNTER — Other Ambulatory Visit: Payer: Self-pay | Admitting: Cardiology

## 2017-12-28 ENCOUNTER — Other Ambulatory Visit: Payer: Self-pay | Admitting: Cardiology

## 2017-12-28 NOTE — Telephone Encounter (Signed)
Reviewed formulary, generic is a preferred. Refill sent in as generic.

## 2017-12-28 NOTE — Telephone Encounter (Signed)
Please discuss with [facility].

## 2018-01-05 DIAGNOSIS — Z1231 Encounter for screening mammogram for malignant neoplasm of breast: Secondary | ICD-10-CM | POA: Diagnosis not present

## 2018-01-05 DIAGNOSIS — J4520 Mild intermittent asthma, uncomplicated: Secondary | ICD-10-CM | POA: Diagnosis not present

## 2018-01-05 DIAGNOSIS — I509 Heart failure, unspecified: Secondary | ICD-10-CM | POA: Diagnosis not present

## 2018-01-05 DIAGNOSIS — J439 Emphysema, unspecified: Secondary | ICD-10-CM | POA: Diagnosis not present

## 2018-01-05 DIAGNOSIS — Z23 Encounter for immunization: Secondary | ICD-10-CM | POA: Diagnosis not present

## 2018-01-18 DIAGNOSIS — R1031 Right lower quadrant pain: Secondary | ICD-10-CM | POA: Diagnosis not present

## 2018-01-25 DIAGNOSIS — K5730 Diverticulosis of large intestine without perforation or abscess without bleeding: Secondary | ICD-10-CM | POA: Diagnosis not present

## 2018-01-25 DIAGNOSIS — R1031 Right lower quadrant pain: Secondary | ICD-10-CM | POA: Diagnosis not present

## 2018-01-26 DIAGNOSIS — G4736 Sleep related hypoventilation in conditions classified elsewhere: Secondary | ICD-10-CM | POA: Diagnosis not present

## 2018-01-26 DIAGNOSIS — J439 Emphysema, unspecified: Secondary | ICD-10-CM | POA: Diagnosis not present

## 2018-01-26 DIAGNOSIS — R05 Cough: Secondary | ICD-10-CM | POA: Diagnosis not present

## 2018-01-26 DIAGNOSIS — I5032 Chronic diastolic (congestive) heart failure: Secondary | ICD-10-CM | POA: Diagnosis not present

## 2018-01-26 DIAGNOSIS — R0602 Shortness of breath: Secondary | ICD-10-CM | POA: Diagnosis not present

## 2018-01-26 DIAGNOSIS — J431 Panlobular emphysema: Secondary | ICD-10-CM | POA: Diagnosis not present

## 2018-01-26 DIAGNOSIS — J9611 Chronic respiratory failure with hypoxia: Secondary | ICD-10-CM | POA: Diagnosis not present

## 2018-02-03 ENCOUNTER — Telehealth: Payer: Self-pay | Admitting: Cardiology

## 2018-02-03 NOTE — Telephone Encounter (Signed)
Stopped taking her Ranexa

## 2018-02-03 NOTE — Telephone Encounter (Signed)
Patient reports stopping this medication yesterday because it has caused her to have a headache and upset stomach. She feels much better after stopping this. She is wanting to know if she should start something new? Will consult with Dr. Agustin Cree

## 2018-02-04 MED ORDER — ISOSORBIDE MONONITRATE ER 30 MG PO TB24: 30.0000 mg | ORAL_TABLET | Freq: Every day | ORAL | 1 refills | Status: DC

## 2018-02-04 NOTE — Telephone Encounter (Signed)
Per Dr. Agustin Cree we will discontinue Ranexa and start Imdur 30 mg daily. Patient informed.

## 2018-02-04 NOTE — Addendum Note (Signed)
Addended by: Ashok Norris on: 02/04/2018 09:35 AM   Modules accepted: Orders

## 2018-02-05 DIAGNOSIS — J439 Emphysema, unspecified: Secondary | ICD-10-CM | POA: Diagnosis not present

## 2018-02-05 DIAGNOSIS — J4520 Mild intermittent asthma, uncomplicated: Secondary | ICD-10-CM | POA: Diagnosis not present

## 2018-02-05 DIAGNOSIS — I509 Heart failure, unspecified: Secondary | ICD-10-CM | POA: Diagnosis not present

## 2018-02-08 ENCOUNTER — Emergency Department (HOSPITAL_COMMUNITY): Payer: PPO

## 2018-02-08 ENCOUNTER — Emergency Department (HOSPITAL_COMMUNITY)
Admission: EM | Admit: 2018-02-08 | Discharge: 2018-02-08 | Disposition: A | Discharge status: Home / Self Care | Payer: PPO | Attending: Emergency Medicine | Admitting: Emergency Medicine

## 2018-02-08 ENCOUNTER — Encounter (HOSPITAL_COMMUNITY): Payer: Self-pay | Admitting: *Deleted

## 2018-02-08 DIAGNOSIS — Z7982 Long term (current) use of aspirin: Secondary | ICD-10-CM | POA: Insufficient documentation

## 2018-02-08 DIAGNOSIS — Y999 Unspecified external cause status: Secondary | ICD-10-CM | POA: Insufficient documentation

## 2018-02-08 DIAGNOSIS — M25561 Pain in right knee: Secondary | ICD-10-CM | POA: Diagnosis not present

## 2018-02-08 DIAGNOSIS — S8991XA Unspecified injury of right lower leg, initial encounter: Secondary | ICD-10-CM | POA: Diagnosis not present

## 2018-02-08 DIAGNOSIS — J449 Chronic obstructive pulmonary disease, unspecified: Secondary | ICD-10-CM | POA: Diagnosis not present

## 2018-02-08 DIAGNOSIS — E119 Type 2 diabetes mellitus without complications: Secondary | ICD-10-CM | POA: Insufficient documentation

## 2018-02-08 DIAGNOSIS — S2232XA Fracture of one rib, left side, initial encounter for closed fracture: Secondary | ICD-10-CM | POA: Insufficient documentation

## 2018-02-08 DIAGNOSIS — R0789 Other chest pain: Secondary | ICD-10-CM | POA: Diagnosis not present

## 2018-02-08 DIAGNOSIS — I130 Hypertensive heart and chronic kidney disease with heart failure and stage 1 through stage 4 chronic kidney disease, or unspecified chronic kidney disease: Secondary | ICD-10-CM | POA: Insufficient documentation

## 2018-02-08 DIAGNOSIS — W19XXXA Unspecified fall, initial encounter: Secondary | ICD-10-CM | POA: Insufficient documentation

## 2018-02-08 DIAGNOSIS — Y939 Activity, unspecified: Secondary | ICD-10-CM | POA: Insufficient documentation

## 2018-02-08 DIAGNOSIS — N183 Chronic kidney disease, stage 3 (moderate): Secondary | ICD-10-CM | POA: Diagnosis not present

## 2018-02-08 DIAGNOSIS — Z79899 Other long term (current) drug therapy: Secondary | ICD-10-CM | POA: Insufficient documentation

## 2018-02-08 DIAGNOSIS — R51 Headache: Secondary | ICD-10-CM | POA: Diagnosis not present

## 2018-02-08 DIAGNOSIS — S2242XA Multiple fractures of ribs, left side, initial encounter for closed fracture: Secondary | ICD-10-CM | POA: Diagnosis not present

## 2018-02-08 DIAGNOSIS — Z87891 Personal history of nicotine dependence: Secondary | ICD-10-CM | POA: Insufficient documentation

## 2018-02-08 DIAGNOSIS — I5032 Chronic diastolic (congestive) heart failure: Secondary | ICD-10-CM | POA: Diagnosis not present

## 2018-02-08 DIAGNOSIS — Y92013 Bedroom of single-family (private) house as the place of occurrence of the external cause: Secondary | ICD-10-CM | POA: Diagnosis not present

## 2018-02-08 DIAGNOSIS — S0990XA Unspecified injury of head, initial encounter: Secondary | ICD-10-CM | POA: Diagnosis not present

## 2018-02-08 DIAGNOSIS — R0781 Pleurodynia: Secondary | ICD-10-CM | POA: Diagnosis not present

## 2018-02-08 MED ORDER — SODIUM CHLORIDE 0.9 % IV SOLN
INTRAVENOUS | Status: DC
  Administered 2018-02-08: 17:00:00 via INTRAVENOUS

## 2018-02-08 MED ORDER — HYDROCODONE-ACETAMINOPHEN 5-325 MG PO TABS
1.0000 | ORAL_TABLET | Freq: Once | ORAL | Status: AC
  Administered 2018-02-08: 1 via ORAL
  Filled 2018-02-08: qty 1

## 2018-02-08 MED ORDER — HYDROCODONE-ACETAMINOPHEN 5-325 MG PO TABS: 1.0000 | ORAL_TABLET | Freq: Four times a day (QID) | ORAL | 0 refills | Status: DC | PRN

## 2018-02-08 MED ORDER — ONDANSETRON HCL 4 MG/2ML IJ SOLN
4.0000 mg | Freq: Once | INTRAMUSCULAR | Status: AC
  Administered 2018-02-08: 4 mg via INTRAVENOUS
  Filled 2018-02-08: qty 2

## 2018-02-08 MED ORDER — FENTANYL CITRATE (PF) 100 MCG/2ML IJ SOLN
25.0000 ug | Freq: Once | INTRAMUSCULAR | Status: AC
  Administered 2018-02-08: 25 ug via INTRAVENOUS
  Filled 2018-02-08: qty 2

## 2018-02-08 NOTE — ED Provider Notes (Signed)
[facility] EMERGENCY DEPARTMENT Provider Note   CSN: [id number] Arrival date & time: 02/08/18  1635     History   Chief Complaint Chief Complaint  Patient presents with  . Fall  . Chest Pain    HPI Danielle [name] is a 84 y.o. female.  Patient with a fall at home on Wednesday.  She fell trying to get to her bedside commode.  So she fell in the bedroom.  She struck her head.  Patient with complaint of left anterior and lateral chest wall pain under the left breast since that time.  Also with complaint of right knee pain and right calf pain.  Patient has some extensive bruising patient's daughter said it happened from that fall on Wednesday.  Patient is not on blood thinners.  Patient denies any pelvis or hip pain or any low back pain or neck pain.  Patient did hit her head.  No obvious evidence of injury but she is complaining of a mild headache.  Patient did not pass out and also did not have loss of consciousness when she hit.     Past Medical History:  Diagnosis Date  . Anginal pain ([place])   . Arthritis   . Asthma   . CHF (congestive heart failure) ([place])   . COPD (chronic obstructive pulmonary disease) ([place])   . Coronary artery disease   . Depression   . GERD (gastroesophageal reflux disease)   . Headache   . History of hiatal hernia   . Hyperlipidemia   . Hypertension   . Insomnia   . Osteoporosis   . Pneumonia 2013  . PONV (postoperative nausea and vomiting)   . Shortness of breath dyspnea    with exertion    Patient Active Problem List   Diagnosis Date Noted  . CKD (chronic kidney disease) stage 3, GFR 30-59 ml/min (HCC) 05/07/2017  . Chronic respiratory failure with hypoxia ([place]) 05/05/2017  . Midsternal chest pain 11/11/2015  . Chronic diastolic congestive heart failure ([place]) 11/11/2015  . Chest pain 11/09/2015  . Depression 11/09/2015  . Panlobular emphysema ([place]) 11/09/2015  . Elevated blood sugar 11/09/2015  . Chronic insomnia  10/24/2015  . Type 2 diabetes mellitus with stage 3 chronic kidney disease ([place]) 08/14/2015  . Mesenteric mass 02/19/2015  . Coronary artery disease involving native coronary artery of native heart without angina pectoris 11/14/2014  . Smoking 11/14/2014  . Mixed hyperlipidemia 11/14/2014    Past Surgical History:  Procedure Laterality Date  . ABDOMINAL HYSTERECTOMY    . APPENDECTOMY    . BREAST SURGERY  40 yrs. ago   growth removed in each breast-benign  . CARDIAC CATHETERIZATION     12/14/12 LHC (HPR): few mild stenotic areas max 40% of ectatic RCA o/w NL coronaries, EF 65%. Med tx.  Marland Kitchen CATARACT EXTRACTION W/ INTRAOCULAR LENS  IMPLANT, BILATERAL Bilateral 15 yrs ago  . CHOLECYSTECTOMY    . FOOT SURGERY    . LAPAROSCOPIC APPENDECTOMY N/A 02/19/2015   Procedure: EXCISION OF MESENTERIC MASS;  Surgeon: Stark Klein, MD;  Location: [facility];  Service: General;  Laterality: N/A;  . REPLACEMENT TOTAL KNEE BILATERAL       OB History   None      Home Medications    Prior to Admission medications   Medication Sig Start Date End Date Taking? Authorizing Provider  albuterol (PROVENTIL HFA;VENTOLIN HFA) 108 (90 BASE) MCG/ACT inhaler Inhale 2 puffs into the lungs every 6 (six) hours as needed.  Yes [provider]  aspirin 81 MG tablet Take 81 mg by mouth daily.   Yes [provider]  budesonide-formoterol (SYMBICORT) 160-4.5 MCG/ACT inhaler Inhale 2 puffs into the lungs 2 (two) times daily.   Yes [provider]  citalopram (CELEXA) 20 MG tablet Take 20 mg by mouth daily. 10/12/16  Yes [provider]  clonazePAM (KLONOPIN) 1 MG tablet Take 1 mg by mouth as needed (sleep).  11/20/14  Yes [provider]  furosemide (LASIX) 20 MG tablet Take 20 mg by mouth 2 (two) times daily.  11/20/14  Yes [provider]  isosorbide mononitrate (IMDUR) 30 MG 24 hr tablet Take 1 tablet (30 mg total) by mouth daily. 02/04/18 05/05/18 Yes Park Liter, MD   metoprolol succinate (TOPROL-XL) 25 MG 24 hr tablet Take 25 mg by mouth daily. 11/20/14  Yes [provider]  pantoprazole (PROTONIX) 40 MG tablet Take 40 mg by mouth 2 (two) times daily. 11/14/16  Yes [provider]  potassium chloride (MICRO-K) 10 MEQ CR capsule Take 10 mEq by mouth daily. 11/20/14  Yes [provider]  rosuvastatin (CRESTOR) 40 MG tablet Take 40 mg by mouth daily.   Yes [provider]  Vitamin D, Ergocalciferol, (DRISDOL) 50000 UNITS CAPS capsule Take 50,000 Units by mouth every Sunday. No specific days   Yes [provider]  HYDROcodone-acetaminophen (NORCO/VICODIN) 5-325 MG tablet Take 1 tablet by mouth every 6 (six) hours as needed for moderate pain. 02/08/18   Fredia Sorrow, MD    Family History Family History  Family history unknown: Yes    Social History Social History   Tobacco Use  . Smoking status: Former Smoker    Packs/day: 0.25    Types: Cigarettes    Last attempt to quit: 08/21/2016    Years since quitting: 1.4  . Smokeless tobacco: Never Used  Substance Use Topics  . Alcohol use: No  . Drug use: No     Allergies   Codeine; Levofloxacin; Penicillins; and Tape   Review of Systems Review of Systems  Constitutional: Negative for fever.  HENT: Negative for congestion.   Eyes: Negative for visual disturbance.  Respiratory: Negative for shortness of breath.   Cardiovascular: Positive for chest pain.  Gastrointestinal: Negative for abdominal pain, nausea and vomiting.  Genitourinary: Negative for dysuria.  Musculoskeletal: Negative for back pain and neck pain.  Skin: Negative for wound.  Neurological: Positive for headaches. Negative for dizziness, seizures, syncope, speech difficulty and weakness.  Hematological: Does not bruise/bleed easily.  Psychiatric/Behavioral: Negative for confusion.     Physical Exam Updated Vital Signs BP (!) 162/67   Pulse 64   Temp 98.5 F (36.9 C) (Oral)    Resp 15   Ht 1.6 m (5\' 3" )   Wt 117.9 kg   SpO2 94%   BMI 46.06 kg/m   Physical Exam  Constitutional: She is oriented to person, place, and time. She appears well-developed and well-nourished. No distress.  HENT:  Head: Normocephalic and atraumatic.  Mouth/Throat: Oropharynx is clear and moist.  Eyes: Pupils are equal, round, and reactive to light. Conjunctivae and EOM are normal.  Neck: Normal range of motion. Neck supple.  Cardiovascular: Normal rate, regular rhythm and normal heart sounds.  Pulmonary/Chest: Effort normal and breath sounds normal. No respiratory distress. She exhibits tenderness.  Tenderness to palpation left anterior chest and lateral chest around the area of the sixth rib.  No crepitance.  Abdominal: Soft. Bowel sounds are normal. There is  no tenderness.  Musculoskeletal: Normal range of motion. She exhibits tenderness. She exhibits no deformity.  Patient with some tenderness to palpation to right knee.  No obvious effusion.  No erythema.  Calf nontender to palpation.  No significant swelling to the right leg greater than left.  There is some swelling to both lower extremities.  There is a lot of scattered bruising.  Cap refill is normal at less than 2 seconds.  Neurological: She is alert and oriented to person, place, and time. No cranial nerve deficit or sensory deficit. She exhibits normal muscle tone. Coordination normal.  Skin: Skin is warm. Capillary refill takes less than 2 seconds.  Nursing note and vitals reviewed.    ED Treatments / Results  Labs (all labs ordered are listed, but only abnormal results are displayed) Labs Reviewed - No data to display  EKG None  Radiology Dg Ribs Unilateral W/chest Left  Result Date: 02/08/2018 CLINICAL DATA:  Pain after a fall 5 days ago. Initial encounter. EXAM: LEFT RIBS AND CHEST - 3+ VIEW COMPARISON:  Chest radiographs 03/01/2016 FINDINGS: The cardiac silhouette remains mildly enlarged. Aortic atherosclerosis  is noted. Lung volumes are lower than on the prior study with mild pulmonary vascular congestion. There is mild chronic interstitial prominence. Mild left basilar opacity likely reflects atelectasis and possibly a small pleural effusion. There is no pneumothorax. There is a minimally displaced lateral left sixth rib fracture. IMPRESSION: 1. Minimally displaced left sixth rib fracture. No pneumothorax. 2. Low lung volumes with mild pulmonary vascular congestion, mild left basilar atelectasis, and possible small left pleural effusion Electronically Signed   By: Logan Bores M.D.   On: 02/08/2018 19:04   Dg Tibia/fibula Right  Result Date: 02/08/2018 CLINICAL DATA:  Fall EXAM: RIGHT TIBIA AND FIBULA - 2 VIEW COMPARISON:  11/18/2017 FINDINGS: Right knee replacement. No fracture or malalignment. Soft tissues are unremarkable. IMPRESSION: No acute osseous abnormality. Electronically Signed   By: Donavan Foil M.D.   On: 02/08/2018 19:02   Ct Head Wo Contrast  Result Date: 02/08/2018 CLINICAL DATA:  Pain after fall EXAM: CT HEAD WITHOUT CONTRAST TECHNIQUE: Contiguous axial images were obtained from the base of the skull through the vertex without intravenous contrast. COMPARISON:  None. FINDINGS: Brain: No subdural, epidural, or subarachnoid hemorrhage. Cerebellum, brainstem, and basal cisterns are normal. Ventricles and sulci are unremarkable. Scattered white matter changes. No acute cortical ischemia or infarct. No midline shift. Vascular: No hyperdense vessel or unexpected calcification. Skull: Normal. Negative for fracture or focal lesion. Sinuses/Orbits: No acute finding. Other: None. IMPRESSION: Scattered white matter changes. No acute intracranial abnormalities identified. Electronically Signed   By: Dorise Bullion III M.D   On: 02/08/2018 19:29   Dg Knee Complete 4 Views Right  Result Date: 02/08/2018 CLINICAL DATA:  Fall with knee pain EXAM: RIGHT KNEE - COMPLETE 4+ VIEW COMPARISON:  11/18/2017  FINDINGS: Status post right knee replacement. No fracture or malalignment. No significant knee effusion. IMPRESSION: Status post right knee replacement.  No acute osseous abnormality Electronically Signed   By: Donavan Foil M.D.   On: 02/08/2018 19:01    Procedures Procedures (including critical care time)  Medications Ordered in ED Medications  0.9 %  sodium chloride infusion ( Intravenous New Bag/Given 02/08/18 1725)  ondansetron (ZOFRAN) injection 4 mg (4 mg Intravenous Given 02/08/18 1726)  fentaNYL (SUBLIMAZE) injection 25 mcg (25 mcg Intravenous Given 02/08/18 1726)  HYDROcodone-acetaminophen (NORCO/VICODIN) 5-325 MG per tablet 1 tablet (1 tablet Oral Given 02/08/18 1950)  Initial Impression / Assessment and Plan / ED Course  I have reviewed the triage vital signs and the nursing notes.  Pertinent labs & imaging results that were available during my care of the patient were reviewed by me and considered in my medical decision making (see chart for details).     Patient's work-up for the fall head CT negative.  Chest x-ray with rib series shows a left 6 rib fracture which correlates to the level of her pain on the chest there.  X-ray of her right knee and right tib-fib without any bony abnormalities.  Patient's had some pain and discomfort there since the fall.  No concerns for a DVT at this point time.  Chest x-ray did show some question of possible small pleural effusion.  But no evidence of any pneumothorax.  On the left side.  Patient will be treated with pain medicine.  Improvement with pain medicine here we will treat with hydrocodone also given incentive spirometer.  Patient will follow back up with her primary care provider.  She will return for any new or worse symptoms to include fevers or any signs of pneumonia.  Final Clinical Impressions(s) / ED Diagnoses   Final diagnoses:  Fall, initial encounter  Injury of head, initial encounter  Closed fracture of one rib of  left side, initial encounter  Acute pain of right knee    ED Discharge Orders         Ordered    HYDROcodone-acetaminophen (NORCO/VICODIN) 5-325 MG tablet  Every 6 hours PRN     02/08/18 1953           Fredia Sorrow, MD 02/08/18 2132

## 2018-02-08 NOTE — ED Notes (Signed)
IS given, teaching provided

## 2018-02-08 NOTE — ED Notes (Signed)
Patient transported to X-ray 

## 2018-02-08 NOTE — ED Triage Notes (Signed)
Pt fell last Wednesday.  EMS evaluated pt but she was not transported to ED.  Pt is here for evaluation today due to increasing pain.  Pt reports pain in left ribs.  Pt has bruising on her legs.  Pt states that she struck her head when she fell, no LOC.  Pt is alert and oriented, able to transfer from [facility] to stretcher

## 2018-02-08 NOTE — ED Notes (Signed)
EDP is at the bedside. 

## 2018-02-08 NOTE — Discharge Instructions (Signed)
Follow-up with your primary care doctor for additional pain medicine.  Work-up here shows a left sixth rib fracture.  No complicating factors otherwise from the fall.  Return for development of any fever or increased shortness of breath or any new or worse symptoms.  Also an incentive spirometer provided.

## 2018-02-15 ENCOUNTER — Telehealth: Payer: Self-pay | Admitting: Cardiology

## 2018-02-15 ENCOUNTER — Other Ambulatory Visit: Payer: Self-pay

## 2018-02-15 NOTE — Patient Outreach (Signed)
[facility]) Care Management  02/15/2018  Danielle [name] 1933/04/21 [id number]   Telephone Screen  Referral Date: 02/15/18 Referral Source: Nurse Call Center Referral Reason: " 02/13/18-12:13pm-caller states her mother fell and was seen in ER, told she has broke a rib and is now experiencing stomach pain and headache- advised to go to ED for eval-patient refused" Insurance: HTA   Outreach attempt #1 to patient. No answer after multiple rings and unable to leave message.     Plan: RN CM will make outreach attempt to patient within 3-4 business days. RN CM will send unsuccessful outreach letter to patient.   [name], RN,BSN,CCM [facility] Management Telephonic Care Management Coordinator Direct Phone: [phone] Toll Free: [phone] Fax: [phone]

## 2018-02-15 NOTE — Telephone Encounter (Signed)
Patient's daughter reports patient had a fall and has been in a lot of pain due to broken ribs. Therefore she has only been taking 20 mg daily of lasix rather than 40 mg so she doesn't have to go to the bathroom as much. She also hasn't been taking Imdur due to it causing her to have a headache. Dr. Agustin Cree informed. As long as patient doesn't become short of breath or develop any new swelling this is okay. Will inform patient's daughter.

## 2018-02-15 NOTE — Telephone Encounter (Signed)
Patient's daughter informed per dpr

## 2018-02-15 NOTE — Telephone Encounter (Signed)
Has questions about fluid pill

## 2018-02-16 ENCOUNTER — Other Ambulatory Visit: Payer: Self-pay

## 2018-02-16 ENCOUNTER — Ambulatory Visit: Payer: PPO | Admitting: Cardiology

## 2018-02-16 NOTE — Patient Outreach (Signed)
[name]) Care Management  02/16/2018  Danielle [name] 22-Jan-1934 [id number]   Telephone Screen  Referral Date: 02/15/18 Referral Source: Nurse Call Center Referral Reason: " 02/13/18-12:13 pm-caller states her mother fell and was seen in ER, told she has broke a rib and is now experiencing stomach pain and headache- advised to go to ED for eval-patient refused" Insurance: HTA    Outreach attempt #2 to patient. Spoke with patient. She voices that she is "feeling a little better." She states that her stomach pain goes away when she eats something. She denies having a headache at present. Patient states that she noticed a "knot on knee" and thinks it is from her fall but just noticed it yesterday. Patient was advised by nurse at call center to seek medical attention. Patient states she already has one EMS bill and does not want another one. She reports that her doctor is "all the way in [place]" and she does not feel like going that far to see him. She does not feel like her issues warrant medical attention at this time. Patient voiced that she " just wanted to rest." She is aware that if symptoms worsen and/or unresolved that she should seek medical attention as soon as possible. Patient denies needing any further RN CM assistance at this time.      Plan: RN CM will close case at this time as no further interventions needed at this time.   [name], RN,BSN,CCM [facility] Management Telephonic Care Management Coordinator Direct Phone: [phone] Toll Free: [phone] Fax: [phone]

## 2018-03-04 DIAGNOSIS — G2581 Restless legs syndrome: Secondary | ICD-10-CM

## 2018-03-04 DIAGNOSIS — M150 Primary generalized (osteo)arthritis: Secondary | ICD-10-CM | POA: Diagnosis not present

## 2018-03-04 HISTORY — DX: Restless legs syndrome: G25.81

## 2018-03-07 DIAGNOSIS — I509 Heart failure, unspecified: Secondary | ICD-10-CM | POA: Diagnosis not present

## 2018-03-07 DIAGNOSIS — J439 Emphysema, unspecified: Secondary | ICD-10-CM | POA: Diagnosis not present

## 2018-03-07 DIAGNOSIS — J4520 Mild intermittent asthma, uncomplicated: Secondary | ICD-10-CM | POA: Diagnosis not present

## 2018-03-22 ENCOUNTER — Ambulatory Visit (INDEPENDENT_AMBULATORY_CARE_PROVIDER_SITE_OTHER): Payer: PPO | Admitting: Physician Assistant

## 2018-03-22 ENCOUNTER — Ambulatory Visit (INDEPENDENT_AMBULATORY_CARE_PROVIDER_SITE_OTHER): Payer: PPO

## 2018-03-22 ENCOUNTER — Encounter (INDEPENDENT_AMBULATORY_CARE_PROVIDER_SITE_OTHER): Payer: Self-pay | Admitting: Physician Assistant

## 2018-03-22 VITALS — Ht 63.0 in | Wt 260.0 lb

## 2018-03-22 DIAGNOSIS — G8929 Other chronic pain: Secondary | ICD-10-CM

## 2018-03-22 DIAGNOSIS — Y92009 Unspecified place in unspecified non-institutional (private) residence as the place of occurrence of the external cause: Secondary | ICD-10-CM

## 2018-03-22 DIAGNOSIS — M5441 Lumbago with sciatica, right side: Secondary | ICD-10-CM

## 2018-03-22 DIAGNOSIS — Z96653 Presence of artificial knee joint, bilateral: Secondary | ICD-10-CM | POA: Diagnosis not present

## 2018-03-22 DIAGNOSIS — M25562 Pain in left knee: Secondary | ICD-10-CM

## 2018-03-22 DIAGNOSIS — M5442 Lumbago with sciatica, left side: Secondary | ICD-10-CM

## 2018-03-22 DIAGNOSIS — W19XXXA Unspecified fall, initial encounter: Secondary | ICD-10-CM | POA: Diagnosis not present

## 2018-03-22 MED ORDER — DICLOFENAC SODIUM 1 % TD GEL: 2.0000 g | Freq: Three times a day (TID) | TRANSDERMAL | 2 refills | Status: DC | PRN

## 2018-03-22 NOTE — Progress Notes (Addendum)
Office Visit Note   Patient: Danielle [name]           Date of Birth: [birth year]           MRN: [id number] Visit Date: 03/22/2018              Requested by: [name], MD [address] [place], [place] [address] PCP: [name], MD  Chief Complaint  Patient presents with  . Right Knee - Pain  . Left Knee - Pain      HPI: The patient is a 84 yo woman here with her daughter for evaluation of bilateral "leg pain" which has been present for several years, but worse over the past 2 months. She reports 2 falls over the past 2 months. When describing where her legs hurt, she starts rubbing the mid thigh area and then the knees into calves. She reports she has knots on her lower legs and knee areas following the falls. She has a history of CHF with peripheral edema and COPD requiring night time oxygen. She takes Lasix for her leg edema, but it has been worse lately and she reports her feet and toes are going numb. She had a left TKA by Dr. Gladstone Lighter about 17 years ago and a right TKA by Dr. Laurance Flatten in [place] about 10 years ago. She reports some chronic LBP as well and restless legs and has been started on medication for restless legs, Requip, by her PCP. She was also given a Prednisone injection 60 mg IM on 03/04/18 for her arthritic symptoms and referred for further evaluation.  She went to the ED following her first fall 02/08/2018 and underwent CT head which showed scattered white matter disease but no bleed or other abnormalities. Chest xray showed a left 6th rib fracture, and vascular congestion, mild left pleural effusion.  And right knee xray showed TKA components in place with good alignment and no fractures or significant knee effusion.  She refuses back xrays this visit.     Assessment & Plan: Visit Diagnoses:  1. Chronic pain of left knee   2. Total knee replacement status, bilateral   3. Fall at home, initial encounter   4. Chronic bilateral low back pain with  bilateral sciatica     Plan: We discussed that her pain may be multi-factorial, related to her low back pain with radicular symptoms as well as her peripheral edema and arthritic changes in the lower extremities. We discussed that her knee replacements appear to have good alignment and no signs of excessive poly wear at this point. We discussed a trial of low dose steroids by mouth, but she does not believe that any of her symptoms are from her back. She does not want to add another pill at this point. She refuses to have back xrays done this visit. We did discuss wearing compression stocks and she was given a prescription for Vive 2XL compression socks to wear around the clock except for showering. We also discussed trying some physical therapy, but she states she would be unable to go to an outpatient program right now as her husband no longer drives, nor does she. We are going to try some Voltaren gel to the knees for her pain localized here. She will follow up in 2 weeks with Dr. Sharol Given. She does need lumbar spine xrays at that visit.   Follow-Up Instructions: Return in about 2 weeks (around 04/05/2018).   Ortho Exam  Patient is alert, oriented,  no adenopathy, well-dressed, normal affect, normal respiratory effort. Obese elderly female who ambulates slowly with a walker. She gets short of breath going from sitting to standing. She has difficulty going from sitting to standing, but her lower extremity strength distally is intact. EHL intact bilaterally. Negative SLR bilaterally. Well healed bilateral TKA knee incisions with range 0-95 degrees bilaterally. Old contusions to knees which are still tender to palpation. No instability appreciated in knees. Moderate bilateral calf edema, with calf circumference of 48 cm bilaterally.   Imaging: Xr Knee 1-2 Views Left  Result Date: 03/22/2018 Good position and alignment of knee replacement components without signs of lucency about components or signs of  fracture  No images are attached to the encounter.  Labs: Lab Results  Component Value Date   HGBA1C 6.7 (H) 02/12/2015   REPTSTATUS 12/17/2014 FINAL 12/15/2014   CULT  12/15/2014    MULTIPLE SPECIES PRESENT, SUGGEST RECOLLECTION Performed at [facility]      Lab Results  Component Value Date   ALBUMIN 4.0 03/12/2015   ALBUMIN 4.0 12/15/2014    Body mass index is 46.06 kg/m.  Orders:  Orders Placed This Encounter  Procedures  . XR Knee 1-2 Views Left   Meds ordered this encounter  Medications  . diclofenac sodium (VOLTAREN) 1 % GEL    Sig: Apply 2 g topically 3 (three) times daily as needed.    Dispense:  100 g    Refill:  2     Procedures: No procedures performed  Clinical Data: No additional findings.  ROS:  All other systems negative, except as noted in the HPI. Review of Systems  Objective: Vital Signs: Ht 5\' 3"  (1.6 m)   Wt 260 lb (117.9 kg)   BMI 46.06 kg/m   Specialty Comments:  No specialty comments available.  PMFS History: Patient Active Problem List   Diagnosis Date Noted  . CKD (chronic kidney disease) stage 3, GFR 30-59 ml/min (HCC) 05/07/2017  . Chronic respiratory failure with hypoxia ([place]) 05/05/2017  . Midsternal chest pain 11/11/2015  . Chronic diastolic congestive heart failure ([place]) 11/11/2015  . Chest pain 11/09/2015  . Depression 11/09/2015  . Panlobular emphysema ([place]) 11/09/2015  . Elevated blood sugar 11/09/2015  . Chronic insomnia 10/24/2015  . Type 2 diabetes mellitus with stage 3 chronic kidney disease ([place]) 08/14/2015  . Mesenteric mass 02/19/2015  . Coronary artery disease involving native coronary artery of native heart without angina pectoris 11/14/2014  . Smoking 11/14/2014  . Mixed hyperlipidemia 11/14/2014   Past Medical History:  Diagnosis Date  . Anginal pain ([place])   . Arthritis   . Asthma   . CHF (congestive heart failure) ([place])   . COPD (chronic obstructive pulmonary disease) ([place])   .  Coronary artery disease   . Depression   . GERD (gastroesophageal reflux disease)   . Headache   . History of hiatal hernia   . Hyperlipidemia   . Hypertension   . Insomnia   . Osteoporosis   . Pneumonia 2013  . PONV (postoperative nausea and vomiting)   . Shortness of breath dyspnea    with exertion    Family History  Family history unknown: Yes    Past Surgical History:  Procedure Laterality Date  . ABDOMINAL HYSTERECTOMY    . APPENDECTOMY    . BREAST SURGERY  40 yrs. ago   growth removed in each breast-benign  . CARDIAC CATHETERIZATION     12/14/12 LHC ([facility]PR): few mild stenotic areas max  40% of ectatic RCA o/w NL coronaries, EF 65%. Med tx.  Marland Kitchen CATARACT EXTRACTION W/ INTRAOCULAR LENS  IMPLANT, BILATERAL Bilateral 15 yrs ago  . CHOLECYSTECTOMY    . FOOT SURGERY    . LAPAROSCOPIC APPENDECTOMY N/A 02/19/2015   Procedure: EXCISION OF MESENTERIC MASS;  Surgeon: Stark Klein, MD;  Location: [facility];  Service: General;  Laterality: N/A;  . REPLACEMENT TOTAL KNEE BILATERAL     Social History   Occupational History  . Not on file  Tobacco Use  . Smoking status: Former Smoker    Packs/day: 0.25    Types: Cigarettes    Last attempt to quit: 08/21/2016    Years since quitting: 1.5  . Smokeless tobacco: Never Used  Substance and Sexual Activity  . Alcohol use: No  . Drug use: No  . Sexual activity: Not on file

## 2018-04-01 ENCOUNTER — Ambulatory Visit (INDEPENDENT_AMBULATORY_CARE_PROVIDER_SITE_OTHER): Payer: PPO | Admitting: Orthopedic Surgery

## 2018-04-07 DIAGNOSIS — I509 Heart failure, unspecified: Secondary | ICD-10-CM | POA: Diagnosis not present

## 2018-04-07 DIAGNOSIS — J4520 Mild intermittent asthma, uncomplicated: Secondary | ICD-10-CM | POA: Diagnosis not present

## 2018-04-07 DIAGNOSIS — J439 Emphysema, unspecified: Secondary | ICD-10-CM | POA: Diagnosis not present

## 2018-04-13 DIAGNOSIS — E538 Deficiency of other specified B group vitamins: Secondary | ICD-10-CM | POA: Insufficient documentation

## 2018-04-13 DIAGNOSIS — R5383 Other fatigue: Secondary | ICD-10-CM | POA: Diagnosis not present

## 2018-04-13 DIAGNOSIS — I2510 Atherosclerotic heart disease of native coronary artery without angina pectoris: Secondary | ICD-10-CM | POA: Diagnosis not present

## 2018-04-13 DIAGNOSIS — R350 Frequency of micturition: Secondary | ICD-10-CM | POA: Diagnosis not present

## 2018-04-13 DIAGNOSIS — R238 Other skin changes: Secondary | ICD-10-CM | POA: Diagnosis not present

## 2018-04-13 DIAGNOSIS — E559 Vitamin D deficiency, unspecified: Secondary | ICD-10-CM | POA: Diagnosis not present

## 2018-04-13 DIAGNOSIS — I87302 Chronic venous hypertension (idiopathic) without complications of left lower extremity: Secondary | ICD-10-CM | POA: Insufficient documentation

## 2018-04-13 DIAGNOSIS — M7989 Other specified soft tissue disorders: Secondary | ICD-10-CM | POA: Diagnosis not present

## 2018-04-13 DIAGNOSIS — M79662 Pain in left lower leg: Secondary | ICD-10-CM | POA: Diagnosis not present

## 2018-04-13 DIAGNOSIS — R5381 Other malaise: Secondary | ICD-10-CM | POA: Diagnosis not present

## 2018-04-13 DIAGNOSIS — E782 Mixed hyperlipidemia: Secondary | ICD-10-CM | POA: Diagnosis not present

## 2018-04-13 DIAGNOSIS — N183 Chronic kidney disease, stage 3 (moderate): Secondary | ICD-10-CM | POA: Diagnosis not present

## 2018-04-13 DIAGNOSIS — R600 Localized edema: Secondary | ICD-10-CM | POA: Diagnosis not present

## 2018-04-13 DIAGNOSIS — E1122 Type 2 diabetes mellitus with diabetic chronic kidney disease: Secondary | ICD-10-CM | POA: Diagnosis not present

## 2018-04-13 DIAGNOSIS — N3000 Acute cystitis without hematuria: Secondary | ICD-10-CM | POA: Diagnosis not present

## 2018-04-13 HISTORY — DX: Chronic venous hypertension (idiopathic) without complications of left lower extremity: I87.302

## 2018-04-13 HISTORY — DX: Deficiency of other specified B group vitamins: E53.8

## 2018-04-23 DIAGNOSIS — J439 Emphysema, unspecified: Secondary | ICD-10-CM | POA: Diagnosis not present

## 2018-04-23 DIAGNOSIS — N183 Chronic kidney disease, stage 3 (moderate): Secondary | ICD-10-CM | POA: Diagnosis not present

## 2018-04-23 DIAGNOSIS — I5032 Chronic diastolic (congestive) heart failure: Secondary | ICD-10-CM | POA: Diagnosis not present

## 2018-04-23 DIAGNOSIS — G4736 Sleep related hypoventilation in conditions classified elsewhere: Secondary | ICD-10-CM | POA: Diagnosis not present

## 2018-04-23 DIAGNOSIS — J9611 Chronic respiratory failure with hypoxia: Secondary | ICD-10-CM | POA: Diagnosis not present

## 2018-04-23 DIAGNOSIS — J431 Panlobular emphysema: Secondary | ICD-10-CM | POA: Diagnosis not present

## 2018-04-27 DIAGNOSIS — L820 Inflamed seborrheic keratosis: Secondary | ICD-10-CM | POA: Diagnosis not present

## 2018-05-03 ENCOUNTER — Ambulatory Visit (INDEPENDENT_AMBULATORY_CARE_PROVIDER_SITE_OTHER): Payer: PPO | Admitting: Orthopedic Surgery

## 2018-05-08 DIAGNOSIS — I509 Heart failure, unspecified: Secondary | ICD-10-CM | POA: Diagnosis not present

## 2018-05-08 DIAGNOSIS — J439 Emphysema, unspecified: Secondary | ICD-10-CM | POA: Diagnosis not present

## 2018-05-08 DIAGNOSIS — J4520 Mild intermittent asthma, uncomplicated: Secondary | ICD-10-CM | POA: Diagnosis not present

## 2018-05-11 ENCOUNTER — Ambulatory Visit (INDEPENDENT_AMBULATORY_CARE_PROVIDER_SITE_OTHER): Payer: PPO | Admitting: Orthopedic Surgery

## 2018-05-11 DIAGNOSIS — M25561 Pain in right knee: Secondary | ICD-10-CM

## 2018-05-11 DIAGNOSIS — G8929 Other chronic pain: Secondary | ICD-10-CM

## 2018-05-11 DIAGNOSIS — M25562 Pain in left knee: Secondary | ICD-10-CM | POA: Diagnosis not present

## 2018-05-14 ENCOUNTER — Encounter: Payer: Self-pay | Admitting: Cardiology

## 2018-05-14 ENCOUNTER — Ambulatory Visit: Payer: PPO | Admitting: Cardiology

## 2018-05-14 ENCOUNTER — Encounter (INDEPENDENT_AMBULATORY_CARE_PROVIDER_SITE_OTHER): Payer: Self-pay | Admitting: Orthopedic Surgery

## 2018-05-14 VITALS — BP 110/62 | HR 82 | Ht 63.0 in | Wt 260.0 lb

## 2018-05-14 DIAGNOSIS — I5032 Chronic diastolic (congestive) heart failure: Secondary | ICD-10-CM | POA: Diagnosis not present

## 2018-05-14 DIAGNOSIS — M25562 Pain in left knee: Principal | ICD-10-CM

## 2018-05-14 DIAGNOSIS — G8929 Other chronic pain: Secondary | ICD-10-CM | POA: Insufficient documentation

## 2018-05-14 DIAGNOSIS — J431 Panlobular emphysema: Secondary | ICD-10-CM

## 2018-05-14 DIAGNOSIS — I2510 Atherosclerotic heart disease of native coronary artery without angina pectoris: Secondary | ICD-10-CM | POA: Diagnosis not present

## 2018-05-14 DIAGNOSIS — M25561 Pain in right knee: Principal | ICD-10-CM

## 2018-05-14 HISTORY — DX: Other chronic pain: G89.29

## 2018-05-14 NOTE — Progress Notes (Signed)
Cardiology Office Note:    Date:  05/14/2018   ID:  Danielle [name], DOB 1933/08/13, MRN [id number]  PCP:  [name], MD  Cardiologist:  [name], MD    Referring MD: [name], MD   Chief Complaint  Patient presents with  . Follow-up  Doing fair  History of Present Illness:    Danielle [name] is a 85 y.o. female with coronary artery disease diastolic congestive heart failure emphysema diabetes overall have like always a lot of complaint biggest one appears to be swelling of lower extremities bothers her a lot this is nonpitting she was given some elastic stockings but she cannot put it on.  Also complained of having need to urinate many times at night in the matter-of-fact in November she fell down going to the restroom.  Past Medical History:  Diagnosis Date  . Anginal pain ([place])   . Arthritis   . Asthma   . CHF (congestive heart failure) ([place])   . COPD (chronic obstructive pulmonary disease) ([place])   . Coronary artery disease   . Depression   . GERD (gastroesophageal reflux disease)   . Headache   . History of hiatal hernia   . Hyperlipidemia   . Hypertension   . Insomnia   . Osteoporosis   . Pneumonia 2013  . PONV (postoperative nausea and vomiting)   . Shortness of breath dyspnea    with exertion    Past Surgical History:  Procedure Laterality Date  . ABDOMINAL HYSTERECTOMY    . APPENDECTOMY    . BREAST SURGERY  40 yrs. ago   growth removed in each breast-benign  . CARDIAC CATHETERIZATION     12/14/12 LHC ([facility]): few mild stenotic areas max 40% of ectatic RCA o/w NL coronaries, EF 65%. Med tx.  Marland Kitchen CATARACT EXTRACTION W/ INTRAOCULAR LENS  IMPLANT, BILATERAL Bilateral 15 yrs ago  . CHOLECYSTECTOMY    . FOOT SURGERY    . LAPAROSCOPIC APPENDECTOMY N/A 02/19/2015   Procedure: EXCISION OF MESENTERIC MASS;  Surgeon: Stark Klein, MD;  Location: [place];  Service: General;  Laterality: N/A;  . REPLACEMENT TOTAL KNEE BILATERAL      Current  Medications: Current Meds  Medication Sig  . albuterol (PROVENTIL HFA;VENTOLIN HFA) 108 (90 BASE) MCG/ACT inhaler Inhale 2 puffs into the lungs every 6 (six) hours as needed.  Marland Kitchen aspirin 81 MG tablet Take 81 mg by mouth daily.  . budesonide-formoterol (SYMBICORT) 160-4.5 MCG/ACT inhaler Inhale 2 puffs into the lungs 2 (two) times daily.  . citalopram (CELEXA) 20 MG tablet Take 20 mg by mouth daily.  . clonazePAM (KLONOPIN) 1 MG tablet Take 1 mg by mouth as needed (sleep).   . diclofenac sodium (VOLTAREN) 1 % GEL Apply 2 g topically 3 (three) times daily as needed.  . ferrous sulfate 325 (65 FE) MG EC tablet Take 325 mg by mouth daily with breakfast.  . furosemide (LASIX) 20 MG tablet Take 20 mg by mouth 2 (two) times daily.   . metoprolol succinate (TOPROL-XL) 25 MG 24 hr tablet Take 25 mg by mouth daily.  . pantoprazole (PROTONIX) 40 MG tablet Take 40 mg by mouth 2 (two) times daily.  . potassium chloride (MICRO-K) 10 MEQ CR capsule Take 10 mEq by mouth daily.  Marland Kitchen rOPINIRole (REQUIP) 0.25 MG tablet Take 0.25 mg by mouth at bedtime.  . rosuvastatin (CRESTOR) 40 MG tablet Take 40 mg by mouth daily.  . Vitamin D, Ergocalciferol, (DRISDOL) 50000 UNITS CAPS capsule  Take 50,000 Units by mouth every Sunday. No specific days     Allergies:   Codeine; Levofloxacin; Penicillins; and Tape   Social History   Socioeconomic History  . Marital status: Married    Spouse name: Not on file  . Number of children: Not on file  . Years of education: Not on file  . Highest education level: Not on file  Occupational History  . Not on file  Social Needs  . Financial resource strain: Not on file  . Food insecurity:    Worry: Not on file    Inability: Not on file  . Transportation needs:    Medical: Not on file    Non-medical: Not on file  Tobacco Use  . Smoking status: Former Smoker    Packs/day: 0.25    Types: Cigarettes    Last attempt to quit: 08/21/2016    Years since quitting: 1.7  . Smokeless  tobacco: Never Used  Substance and Sexual Activity  . Alcohol use: No  . Drug use: No  . Sexual activity: Not on file  Lifestyle  . Physical activity:    Days per week: Not on file    Minutes per session: Not on file  . Stress: Not on file  Relationships  . Social connections:    Talks on phone: Not on file    Gets together: Not on file    Attends religious service: Not on file    Active member of club or organization: Not on file    Attends meetings of clubs or organizations: Not on file    Relationship status: Not on file  Other Topics Concern  . Not on file  Social History Narrative  . Not on file     Family History: The patient's Family history is unknown by patient. ROS:   Please see the history of present illness.    All 14 point review of systems negative except as described per history of present illness  EKGs/Labs/Other Studies Reviewed:      Recent Labs: 06/23/2017: BUN 21; Creatinine, Ser 0.89; NT-Pro BNP 155; Potassium 4.5; Sodium 144  Recent Lipid Panel No results found for: CHOL, TRIG, HDL, CHOLHDL, VLDL, LDLCALC, LDLDIRECT  Physical Exam:    VS:  BP 110/62   Pulse 82   Ht 5\' 3"  (1.6 m)   Wt 260 lb (117.9 kg) Comment: Reported,  SpO2 95%   BMI 46.06 kg/m     Wt Readings from Last 3 Encounters:  05/14/18 260 lb (117.9 kg)  03/22/18 260 lb (117.9 kg)  02/08/18 260 lb (117.9 kg)     GEN:  Well nourished, well developed in no acute distress HEENT: Normal NECK: No JVD; No carotid bruits LYMPHATICS: No lymphadenopathy CARDIAC: RRR, no murmurs, no rubs, no gallops RESPIRATORY:  Clear to auscultation without rales, wheezing or rhonchi  ABDOMEN: Soft, non-tender, non-distended MUSCULOSKELETAL:  No edema; No deformity  SKIN: Warm and dry LOWER EXTREMITIES: no swelling NEUROLOGIC:  Alert and oriented x 3 PSYCHIATRIC:  Normal affect   ASSESSMENT:    1. Coronary artery disease involving native coronary artery of native heart without angina  pectoris   2. Chronic diastolic congestive heart failure (HCC)   3. Panlobular emphysema (HCC)    PLAN:    In order of problems listed above:  1. Coronary disease stable denies have any chest pain tightness squeezing pressure burning chest. 2. Chronic diastolic congestive heart appears to be compensated. 3. Emphysema: Followed by pulmonary. 4. Diabetes she refused  to take any medications for it. Dyslipidemia on statin which I will continue   Medication Adjustments/Labs and Tests Ordered: Current medicines are reviewed at length with the patient today.  Concerns regarding medicines are outlined above.  No orders of the defined types were placed in this encounter.  Medication changes: No orders of the defined types were placed in this encounter.   Signed, Park Liter, MD, [facility] 05/14/2018 10:35 AM    [place]

## 2018-05-14 NOTE — Patient Instructions (Signed)
Medication Instructions:  Your physician recommends that you continue on your current medications as directed. Please refer to the Current Medication list given to you today.  If you need a refill on your cardiac medications before your next appointment, please call your pharmacy.   Lab work: None.  If you have labs (blood work) drawn today and your tests are completely normal, you will receive your results only by: . MyChart Message (if you have MyChart) OR . A paper copy in the mail If you have any lab test that is abnormal or we need to change your treatment, we will call you to review the results.  Testing/Procedures: None.   Follow-Up: At [facility], you and your health needs are our priority.  As part of our continuing mission to provide you with exceptional heart care, we have created designated Provider Care Teams.  These Care Teams include your primary Cardiologist (physician) and Advanced Practice Providers (APPs -  Physician Assistants and Nurse Practitioners) who all work together to provide you with the care you need, when you need it. You will need a follow up appointment in 5 months.  Please call our office 2 months in advance to schedule this appointment.  You may see No primary care provider on file. or another member of our [facility] Provider Team in [place]: Brian Munley, MD . Rajan Revankar, MD  Any Other Special Instructions Will Be Listed Below (If Applicable).    

## 2018-05-14 NOTE — Progress Notes (Signed)
Office Visit Note   Patient: Danielle [name]           Date of Birth: 01/03/1934           MRN: [id number] Visit Date: 05/11/2018              Requested by: [name], MD [place], [place] [address] PCP: [name], MD  Chief Complaint  Patient presents with  . Left Knee - Pain  . Right Knee - Pain  . Right Ankle - Pain  . Left Ankle - Pain  . Lower Back - Pain      HPI: Patient is an 85 year old woman presents with bilateral knee pain bilateral ankle pain and lower back pain.  She states that the knee pain has been exacerbated by a fall back in November.  She has been using Voltaren gel and states that this is helpful.  She also complains of bilateral ankle pain she states that she has instability with her ankles.  Patient also complains of chronic lower back pain which is worse with activity she denies any radicular symptoms.  She states that she has had epidural steroid injections in the past which have provided only temporary relief.  Assessment & Plan: Visit Diagnoses:  1. Chronic pain of both knees     Plan: Patient does have lower extremity weakness.  Recommended that she participate with the Silver sneakers for exercise and strengthening this should help the back and lower extremity pain and weakness.  Recommended that she bring her socks with her her legs measured double extra-large.  She states that she does have leg cramping she states she is on a fluid pill recommended coconut water for electrolyte replacement.  Follow-Up Instructions: Return in about 4 weeks (around 06/08/2018).   Ortho Exam  Patient is alert, oriented, no adenopathy, well-dressed, normal affect, normal respiratory effort. Examination patient has swelling in both lower extremities with venous insufficiency.  She has a negative straight leg raise bilaterally with no focal motor weakness in either lower extremity.  There is crepitation with range of motion of both knee the  medial and lateral joint lines are nontender to palpation collaterals and cruciates are stable.  She has no focal motor weakness in her ankles.  Imaging: No results found. No images are attached to the encounter.  Labs: Lab Results  Component Value Date   HGBA1C 6.7 (H) 02/12/2015   REPTSTATUS 12/17/2014 FINAL 12/15/2014   CULT  12/15/2014    MULTIPLE SPECIES PRESENT, SUGGEST RECOLLECTION Performed at [facility]      Lab Results  Component Value Date   ALBUMIN 4.0 03/12/2015   ALBUMIN 4.0 12/15/2014    There is no height or weight on file to calculate BMI.  Orders:  No orders of the defined types were placed in this encounter.  No orders of the defined types were placed in this encounter.    Procedures: No procedures performed  Clinical Data: No additional findings.  ROS:  All other systems negative, except as noted in the HPI. Review of Systems  Objective: Vital Signs: There were no vitals taken for this visit.  Specialty Comments:  No specialty comments available.  PMFS History: Patient Active Problem List   Diagnosis Date Noted  . Chronic pain of both knees 05/14/2018  . CKD (chronic kidney disease) stage 3, GFR 30-59 ml/min (HCC) 05/07/2017  . Chronic respiratory failure with hypoxia ([place]) 05/05/2017  . Midsternal chest pain 11/11/2015  .  Chronic diastolic congestive heart failure ([place]) 11/11/2015  . Chest pain 11/09/2015  . Depression 11/09/2015  . Panlobular emphysema ([place]) 11/09/2015  . Elevated blood sugar 11/09/2015  . Chronic insomnia 10/24/2015  . Type 2 diabetes mellitus with stage 3 chronic kidney disease ([place]) 08/14/2015  . Mesenteric mass 02/19/2015  . Coronary artery disease involving native coronary artery of native heart without angina pectoris 11/14/2014  . Smoking 11/14/2014  . Mixed hyperlipidemia 11/14/2014   Past Medical History:  Diagnosis Date  . Anginal pain ([place])   . Arthritis   . Asthma   . CHF (congestive  heart failure) ([place])   . COPD (chronic obstructive pulmonary disease) ([place])   . Coronary artery disease   . Depression   . GERD (gastroesophageal reflux disease)   . Headache   . History of hiatal hernia   . Hyperlipidemia   . Hypertension   . Insomnia   . Osteoporosis   . Pneumonia 2013  . PONV (postoperative nausea and vomiting)   . Shortness of breath dyspnea    with exertion    Family History  Family history unknown: Yes    Past Surgical History:  Procedure Laterality Date  . ABDOMINAL HYSTERECTOMY    . APPENDECTOMY    . BREAST SURGERY  40 yrs. ago   growth removed in each breast-benign  . CARDIAC CATHETERIZATION     12/14/12 LHC ([facility]): few mild stenotic areas max 40% of ectatic RCA o/w NL coronaries, EF 65%. Med tx.  Marland Kitchen CATARACT EXTRACTION W/ INTRAOCULAR LENS  IMPLANT, BILATERAL Bilateral 15 yrs ago  . CHOLECYSTECTOMY    . FOOT SURGERY    . LAPAROSCOPIC APPENDECTOMY N/A 02/19/2015   Procedure: EXCISION OF MESENTERIC MASS;  Surgeon: Stark Klein, MD;  Location: [place];  Service: General;  Laterality: N/A;  . REPLACEMENT TOTAL KNEE BILATERAL     Social History   Occupational History  . Not on file  Tobacco Use  . Smoking status: Former Smoker    Packs/day: 0.25    Types: Cigarettes    Last attempt to quit: 08/21/2016    Years since quitting: 1.7  . Smokeless tobacco: Never Used  Substance and Sexual Activity  . Alcohol use: No  . Drug use: No  . Sexual activity: Not on file

## 2018-05-21 DIAGNOSIS — N183 Chronic kidney disease, stage 3 (moderate): Secondary | ICD-10-CM | POA: Diagnosis not present

## 2018-05-21 DIAGNOSIS — G2581 Restless legs syndrome: Secondary | ICD-10-CM | POA: Diagnosis not present

## 2018-05-21 DIAGNOSIS — R5381 Other malaise: Secondary | ICD-10-CM | POA: Diagnosis not present

## 2018-05-21 DIAGNOSIS — J431 Panlobular emphysema: Secondary | ICD-10-CM | POA: Diagnosis not present

## 2018-05-21 DIAGNOSIS — I87302 Chronic venous hypertension (idiopathic) without complications of left lower extremity: Secondary | ICD-10-CM | POA: Diagnosis not present

## 2018-05-21 DIAGNOSIS — R5383 Other fatigue: Secondary | ICD-10-CM | POA: Diagnosis not present

## 2018-05-21 DIAGNOSIS — I5032 Chronic diastolic (congestive) heart failure: Secondary | ICD-10-CM | POA: Diagnosis not present

## 2018-05-21 DIAGNOSIS — J40 Bronchitis, not specified as acute or chronic: Secondary | ICD-10-CM | POA: Diagnosis not present

## 2018-05-21 DIAGNOSIS — J9611 Chronic respiratory failure with hypoxia: Secondary | ICD-10-CM | POA: Diagnosis not present

## 2018-05-21 DIAGNOSIS — I2510 Atherosclerotic heart disease of native coronary artery without angina pectoris: Secondary | ICD-10-CM | POA: Diagnosis not present

## 2018-05-21 DIAGNOSIS — E1122 Type 2 diabetes mellitus with diabetic chronic kidney disease: Secondary | ICD-10-CM | POA: Diagnosis not present

## 2018-05-27 DIAGNOSIS — J40 Bronchitis, not specified as acute or chronic: Secondary | ICD-10-CM | POA: Diagnosis not present

## 2018-05-27 DIAGNOSIS — Z87891 Personal history of nicotine dependence: Secondary | ICD-10-CM | POA: Diagnosis not present

## 2018-05-27 DIAGNOSIS — R05 Cough: Secondary | ICD-10-CM | POA: Diagnosis not present

## 2018-05-27 DIAGNOSIS — J9611 Chronic respiratory failure with hypoxia: Secondary | ICD-10-CM | POA: Diagnosis not present

## 2018-05-27 DIAGNOSIS — N183 Chronic kidney disease, stage 3 (moderate): Secondary | ICD-10-CM | POA: Diagnosis not present

## 2018-05-27 DIAGNOSIS — J431 Panlobular emphysema: Secondary | ICD-10-CM | POA: Diagnosis not present

## 2018-05-27 DIAGNOSIS — E1122 Type 2 diabetes mellitus with diabetic chronic kidney disease: Secondary | ICD-10-CM | POA: Diagnosis not present

## 2018-06-06 DIAGNOSIS — J439 Emphysema, unspecified: Secondary | ICD-10-CM | POA: Diagnosis not present

## 2018-06-06 DIAGNOSIS — J4520 Mild intermittent asthma, uncomplicated: Secondary | ICD-10-CM | POA: Diagnosis not present

## 2018-06-06 DIAGNOSIS — I509 Heart failure, unspecified: Secondary | ICD-10-CM | POA: Diagnosis not present

## 2018-06-10 ENCOUNTER — Ambulatory Visit (INDEPENDENT_AMBULATORY_CARE_PROVIDER_SITE_OTHER): Payer: PPO | Admitting: Orthopedic Surgery

## 2018-07-07 DIAGNOSIS — H353221 Exudative age-related macular degeneration, left eye, with active choroidal neovascularization: Secondary | ICD-10-CM | POA: Diagnosis not present

## 2018-07-07 DIAGNOSIS — J4520 Mild intermittent asthma, uncomplicated: Secondary | ICD-10-CM | POA: Diagnosis not present

## 2018-07-07 DIAGNOSIS — I509 Heart failure, unspecified: Secondary | ICD-10-CM | POA: Diagnosis not present

## 2018-07-07 DIAGNOSIS — H353132 Nonexudative age-related macular degeneration, bilateral, intermediate dry stage: Secondary | ICD-10-CM | POA: Diagnosis not present

## 2018-07-07 DIAGNOSIS — C187 Malignant neoplasm of sigmoid colon: Secondary | ICD-10-CM | POA: Diagnosis not present

## 2018-07-07 DIAGNOSIS — J439 Emphysema, unspecified: Secondary | ICD-10-CM | POA: Diagnosis not present

## 2018-07-12 ENCOUNTER — Other Ambulatory Visit (INDEPENDENT_AMBULATORY_CARE_PROVIDER_SITE_OTHER): Payer: Self-pay | Admitting: Physician Assistant

## 2018-08-06 DIAGNOSIS — J439 Emphysema, unspecified: Secondary | ICD-10-CM | POA: Diagnosis not present

## 2018-08-06 DIAGNOSIS — J4520 Mild intermittent asthma, uncomplicated: Secondary | ICD-10-CM | POA: Diagnosis not present

## 2018-08-06 DIAGNOSIS — I509 Heart failure, unspecified: Secondary | ICD-10-CM | POA: Diagnosis not present

## 2018-08-12 DIAGNOSIS — R609 Edema, unspecified: Secondary | ICD-10-CM

## 2018-08-12 DIAGNOSIS — R1310 Dysphagia, unspecified: Secondary | ICD-10-CM | POA: Insufficient documentation

## 2018-08-12 DIAGNOSIS — J431 Panlobular emphysema: Secondary | ICD-10-CM | POA: Diagnosis not present

## 2018-08-12 DIAGNOSIS — N183 Chronic kidney disease, stage 3 (moderate): Secondary | ICD-10-CM | POA: Diagnosis not present

## 2018-08-12 DIAGNOSIS — D692 Other nonthrombocytopenic purpura: Secondary | ICD-10-CM

## 2018-08-12 DIAGNOSIS — S46911A Strain of unspecified muscle, fascia and tendon at shoulder and upper arm level, right arm, initial encounter: Secondary | ICD-10-CM | POA: Insufficient documentation

## 2018-08-12 DIAGNOSIS — I2510 Atherosclerotic heart disease of native coronary artery without angina pectoris: Secondary | ICD-10-CM | POA: Diagnosis not present

## 2018-08-12 DIAGNOSIS — E1122 Type 2 diabetes mellitus with diabetic chronic kidney disease: Secondary | ICD-10-CM | POA: Diagnosis not present

## 2018-08-12 DIAGNOSIS — J9611 Chronic respiratory failure with hypoxia: Secondary | ICD-10-CM | POA: Diagnosis not present

## 2018-08-12 DIAGNOSIS — R300 Dysuria: Secondary | ICD-10-CM | POA: Diagnosis not present

## 2018-08-12 DIAGNOSIS — D509 Iron deficiency anemia, unspecified: Secondary | ICD-10-CM

## 2018-08-12 DIAGNOSIS — I5032 Chronic diastolic (congestive) heart failure: Secondary | ICD-10-CM | POA: Diagnosis not present

## 2018-08-12 HISTORY — DX: Strain of unspecified muscle, fascia and tendon at shoulder and upper arm level, right arm, initial encounter: S46.911A

## 2018-08-12 HISTORY — DX: Dysphagia, unspecified: R13.10

## 2018-08-12 HISTORY — DX: Edema, unspecified: R60.9

## 2018-08-12 HISTORY — DX: Iron deficiency anemia, unspecified: D50.9

## 2018-08-12 HISTORY — DX: Other nonthrombocytopenic purpura: D69.2

## 2018-08-13 DIAGNOSIS — R609 Edema, unspecified: Secondary | ICD-10-CM | POA: Diagnosis not present

## 2018-08-13 DIAGNOSIS — R7989 Other specified abnormal findings of blood chemistry: Secondary | ICD-10-CM | POA: Diagnosis not present

## 2018-08-13 DIAGNOSIS — R600 Localized edema: Secondary | ICD-10-CM | POA: Diagnosis not present

## 2018-09-06 DIAGNOSIS — J439 Emphysema, unspecified: Secondary | ICD-10-CM | POA: Diagnosis not present

## 2018-09-06 DIAGNOSIS — I509 Heart failure, unspecified: Secondary | ICD-10-CM | POA: Diagnosis not present

## 2018-09-06 DIAGNOSIS — J4520 Mild intermittent asthma, uncomplicated: Secondary | ICD-10-CM | POA: Diagnosis not present

## 2018-09-07 ENCOUNTER — Ambulatory Visit (INDEPENDENT_AMBULATORY_CARE_PROVIDER_SITE_OTHER): Payer: PPO | Admitting: Cardiology

## 2018-09-07 ENCOUNTER — Other Ambulatory Visit: Payer: Self-pay

## 2018-09-07 ENCOUNTER — Encounter: Payer: Self-pay | Admitting: Cardiology

## 2018-09-07 VITALS — BP 140/76 | HR 60 | Ht 63.0 in | Wt 273.0 lb

## 2018-09-07 DIAGNOSIS — R7989 Other specified abnormal findings of blood chemistry: Secondary | ICD-10-CM

## 2018-09-07 DIAGNOSIS — R0609 Other forms of dyspnea: Secondary | ICD-10-CM | POA: Diagnosis not present

## 2018-09-07 DIAGNOSIS — I5032 Chronic diastolic (congestive) heart failure: Secondary | ICD-10-CM | POA: Diagnosis not present

## 2018-09-07 DIAGNOSIS — R0600 Dyspnea, unspecified: Secondary | ICD-10-CM

## 2018-09-07 DIAGNOSIS — I700 Atherosclerosis of aorta: Secondary | ICD-10-CM | POA: Diagnosis not present

## 2018-09-07 DIAGNOSIS — R0602 Shortness of breath: Secondary | ICD-10-CM | POA: Diagnosis not present

## 2018-09-07 DIAGNOSIS — E782 Mixed hyperlipidemia: Secondary | ICD-10-CM | POA: Diagnosis not present

## 2018-09-07 HISTORY — DX: Other forms of dyspnea: R06.09

## 2018-09-07 HISTORY — DX: Morbid (severe) obesity due to excess calories: E66.01

## 2018-09-07 HISTORY — DX: Dyspnea, unspecified: R06.00

## 2018-09-07 MED ORDER — METOLAZONE 5 MG PO TABS: 5.0000 mg | ORAL_TABLET | Freq: Every day | ORAL | 3 refills | Status: DC

## 2018-09-07 NOTE — Progress Notes (Signed)
Cardiology Office Note:    Date:  09/07/2018   ID:  Danielle [name], DOB 03-26-1934, MRN [id number]  PCP:  [name], MD  Cardiologist:  Jenean Lindau, MD   Referring MD: [name], MD    ASSESSMENT:    1. Chronic diastolic congestive heart failure ([place])   2. Mixed hyperlipidemia   3. DOE (dyspnea on exertion)    PLAN:    In order of problems listed above:  1. Secondary prevention stressed with the patient.  Importance of compliance with diet and medication stressed and she vocalized understanding. 2. She does have significant shortness of breath issues and I am glad that a DVT study was done at [facility] according to the history provided by her daughter.  I am told this was negative.  We will try to obtain that report for her records. 3. In view of shortness of breath I would like her to get blood test done today which will be Chem-7, CBC, TSH, liver panel, BNP and d-dimer on a stat basis.  I would also like to send her to [facility] for chest x-ray PA and lateral view.  In view of symptomatology of congestive heart failure we will schedule her for an echocardiogram in the next few days. 4. I will change her diuretic from Lasix.  I will stop the furosemide and changed to Zaroxolyn 5 mg daily.  She will be back in 1 week for a Chem-7.  She will be seen in follow-up appointment in 2 weeks or earlier if she has any concerns.  She knows to go to the nearest emergency room for any concerning symptoms.Her oxygen sat was 95% here today 5. Total time for this evaluation including record review from our notes and [facility] was 50 minutes.  Patient and daughter had multiple questions which were answered to their satisfaction.  Her d-dimer will help me assess if there is any other possible etiology for her shortness of breath such as pulmonary thromboembolism.  Incidentally her pulse ox today on room air was.  She also mentions to me that she uses oxygen at  home.  I urged her to use it as directed by her primary care physician and pulmonologist and to get in touch with her pulmonologist about these issues about shortness of breath also.   Medication Adjustments/Labs and Tests Ordered: Current medicines are reviewed at length with the patient today.  Concerns regarding medicines are outlined above.  No orders of the defined types were placed in this encounter.  No orders of the defined types were placed in this encounter.    No chief complaint on file.    History of Present Illness:    Danielle [name] is a 85 y.o. female.  She has past medical history of multiple and complex medical problems.  She has history of essential hypertension, diabetes mellitus, dyslipidemia and morbid obesity.  She mentions to me that in the past several weeks she is noticing shortness of breath on exertion which is getting progressively worse.  She also complains of pedal edema bilaterally.  Left greater than right.  The daughter accompanies her for this visit.  She mentions to me that she was at [facility] and her doctor wanted her evaluated for a blood clot in the legs.  She underwent sonogram testing and she was told that it was negative for blood clots.  At the time of my evaluation, the patient is alert awake oriented and in no  distress.  Past Medical History:  Diagnosis Date  . Anginal pain ([place])   . Arthritis   . Asthma   . CHF (congestive heart failure) ([place])   . COPD (chronic obstructive pulmonary disease) ([place])   . Coronary artery disease   . Depression   . GERD (gastroesophageal reflux disease)   . Headache   . History of hiatal hernia   . Hyperlipidemia   . Hypertension   . Insomnia   . Osteoporosis   . Pneumonia 2013  . PONV (postoperative nausea and vomiting)   . Shortness of breath dyspnea    with exertion    Past Surgical History:  Procedure Laterality Date  . ABDOMINAL HYSTERECTOMY    . APPENDECTOMY    . BREAST SURGERY   40 yrs. ago   growth removed in each breast-benign  . CARDIAC CATHETERIZATION     12/14/12 LHC ([facility]): few mild stenotic areas max 40% of ectatic RCA o/w NL coronaries, EF 65%. Med tx.  Marland Kitchen CATARACT EXTRACTION W/ INTRAOCULAR LENS  IMPLANT, BILATERAL Bilateral 15 yrs ago  . CHOLECYSTECTOMY    . FOOT SURGERY    . LAPAROSCOPIC APPENDECTOMY N/A 02/19/2015   Procedure: EXCISION OF MESENTERIC MASS;  Surgeon: Stark Klein, MD;  Location: [facility];  Service: General;  Laterality: N/A;  . REPLACEMENT TOTAL KNEE BILATERAL      Current Medications: Current Meds  Medication Sig  . albuterol (PROVENTIL HFA;VENTOLIN HFA) 108 (90 BASE) MCG/ACT inhaler Inhale 2 puffs into the lungs every 6 (six) hours as needed.  Marland Kitchen aspirin 81 MG tablet Take 81 mg by mouth daily.  . budesonide-formoterol (SYMBICORT) 160-4.5 MCG/ACT inhaler Inhale 2 puffs into the lungs 2 (two) times daily.  . citalopram (CELEXA) 20 MG tablet Take 20 mg by mouth daily.  . clonazePAM (KLONOPIN) 1 MG tablet Take 1 mg by mouth as needed (sleep).   . diclofenac sodium (VOLTAREN) 1 % GEL APPLY 2 GRAMS  TOPICALLY THREE TIMES DAILY AS NEEDED  . furosemide (LASIX) 20 MG tablet Take 20 mg by mouth 2 (two) times daily.   Marland Kitchen glimepiride (AMARYL) 1 MG tablet Take 1 mg by mouth daily.  . metoprolol succinate (TOPROL-XL) 25 MG 24 hr tablet Take 25 mg by mouth daily.  . pantoprazole (PROTONIX) 40 MG tablet Take 40 mg by mouth 2 (two) times daily.  . potassium chloride (MICRO-K) 10 MEQ CR capsule Take 10 mEq by mouth daily.  Marland Kitchen rOPINIRole (REQUIP) 0.25 MG tablet Take 0.25 mg by mouth at bedtime.  . rosuvastatin (CRESTOR) 40 MG tablet Take 40 mg by mouth daily.  . Vitamin D, Ergocalciferol, (DRISDOL) 50000 UNITS CAPS capsule Take 50,000 Units by mouth every Sunday. No specific days     Allergies:   Codeine; Levofloxacin; Penicillins; and Tape   Social History   Socioeconomic History  . Marital status: Married    Spouse name: Not on file  . Number of  children: Not on file  . Years of education: Not on file  . Highest education level: Not on file  Occupational History  . Not on file  Social Needs  . Financial resource strain: Not on file  . Food insecurity:    Worry: Not on file    Inability: Not on file  . Transportation needs:    Medical: Not on file    Non-medical: Not on file  Tobacco Use  . Smoking status: Former Smoker    Packs/day: 0.25    Types: Cigarettes    Last  attempt to quit: 08/21/2016    Years since quitting: 2.0  . Smokeless tobacco: Never Used  Substance and Sexual Activity  . Alcohol use: No  . Drug use: No  . Sexual activity: Not on file  Lifestyle  . Physical activity:    Days per week: Not on file    Minutes per session: Not on file  . Stress: Not on file  Relationships  . Social connections:    Talks on phone: Not on file    Gets together: Not on file    Attends religious service: Not on file    Active member of club or organization: Not on file    Attends meetings of clubs or organizations: Not on file    Relationship status: Not on file  Other Topics Concern  . Not on file  Social History Narrative  . Not on file     Family History: The patient's Family history is unknown by patient.  ROS:   Please see the history of present illness.    All other systems reviewed and are negative.  EKGs/Labs/Other Studies Reviewed:    The following studies were reviewed today: I reviewed echocardiogram report from [facility] done on 07/01/2017 which revealed mild LVH and an ejection fraction of 55% with impaired left ventricular laxation.   Recent Labs: No results found for requested labs within last 8760 hours.  Recent Lipid Panel No results found for: CHOL, TRIG, HDL, CHOLHDL, VLDL, LDLCALC, LDLDIRECT  Physical Exam:    VS:  BP 140/76 (BP Location: Left Arm, Patient Position: Sitting, Cuff Size: Normal)   Pulse 60   Ht 5\' 3"  (1.6 m)   Wt 273 lb (123.8 kg)   SpO2 95%   BMI 48.36 kg/m      Wt Readings from Last 3 Encounters:  09/07/18 273 lb (123.8 kg)  05/14/18 260 lb (117.9 kg)  03/22/18 260 lb (117.9 kg)     GEN: Patient is in no acute distress HEENT: Normal NECK: No JVD; No carotid bruits LYMPHATICS: No lymphadenopathy CARDIAC: Hear sounds regular, 2/6 systolic murmur at the apex. RESPIRATORY:  Clear to auscultation without rales, wheezing or rhonchi  ABDOMEN: Soft, non-tender, non-distended MUSCULOSKELETAL:  No edema; No deformity  SKIN: Warm and dry NEUROLOGIC:  Alert and oriented x 3 PSYCHIATRIC:  Normal affect   Signed, Jenean Lindau, MD  09/07/2018 9:44 AM    [place]

## 2018-09-07 NOTE — Patient Instructions (Signed)
Medication Instructions:  Your physician has recommended you make the following change in your medication:  STOP TAKING furosemide  START taking Zaroxolyn 5 mg (1 tablet) once daily  If you need a refill on your cardiac medications before your next appointment, please call your pharmacy.   Lab work: You had Chem-7, CBC, TSH, liver panel, BNP and d-dimer drawn today.    YOU WILL NEED TO COME IN NEXT WEEK FOR REPEAT BMP If you have labs (blood work) drawn today and your tests are completely normal, you will receive your results only by: Marland Kitchen MyChart Message (if you have MyChart) OR . A paper copy in the mail If you have any lab test that is abnormal or we need to change your treatment, we will call you to review the results.  Testing/Procedures: A chest x-ray takes a picture of the organs and structures inside the chest, including the heart, lungs, and blood vessels. This test can show several things, including, whether the heart is enlarges; whether fluid is building up in the lungs; and whether pacemaker / defibrillator leads are still in place.  Your physician has requested that you have an echocardiogram. Echocardiography is a painless test that uses sound waves to create images of your heart. It provides your doctor with information about the size and shape of your heart and how well your heart's chambers and valves are working. This procedure takes approximately one hour. There are no restrictions for this procedure.     Follow-Up: At [facility], you and your health needs are our priority.  As part of our continuing mission to provide you with exceptional heart care, we have created designated Provider Care Teams.  These Care Teams include your primary Cardiologist (physician) and Advanced Practice Providers (APPs -  Physician Assistants and Nurse Practitioners) who all work together to provide you with the care you need, when you need it. You will need a follow up appointment in 2  weeks.    Any Other Special Instructions Will Be Listed Below  Echocardiogram An echocardiogram is a procedure that uses painless sound waves (ultrasound) to produce an image of the heart. Images from an echocardiogram can provide important information about:  Signs of coronary artery disease (CAD).  Aneurysm detection. An aneurysm is a weak or damaged part of an artery wall that bulges out from the normal force of blood pumping through the body.  Heart size and shape. Changes in the size or shape of the heart can be associated with certain conditions, including heart failure, aneurysm, and CAD.  Heart muscle function.  Heart valve function.  Signs of a past heart attack.  Fluid buildup around the heart.  Thickening of the heart muscle.  A tumor or infectious growth around the heart valves. Tell a health care provider about:  Any allergies you have.  All medicines you are taking, including vitamins, herbs, eye drops, creams, and over-the-counter medicines.  Any blood disorders you have.  Any surgeries you have had.  Any medical conditions you have.  Whether you are pregnant or may be pregnant. What are the risks? Generally, this is a safe procedure. However, problems may occur, including:  Allergic reaction to dye (contrast) that may be used during the procedure. What happens before the procedure? No specific preparation is needed. You may eat and drink normally. What happens during the procedure?   An IV tube may be inserted into one of your veins.  You may receive contrast through this tube. A contrast  is an injection that improves the quality of the pictures from your heart.  A gel will be applied to your chest.  A wand-like tool (transducer) will be moved over your chest. The gel will help to transmit the sound waves from the transducer.  The sound waves will harmlessly bounce off of your heart to allow the heart images to be captured in real-time motion. The  images will be recorded on a computer. The procedure may vary among health care providers and hospitals. What happens after the procedure?  You may return to your normal, everyday life, including diet, activities, and medicines, unless your health care provider tells you not to do that. Summary  An echocardiogram is a procedure that uses painless sound waves (ultrasound) to produce an image of the heart.  Images from an echocardiogram can provide important information about the size and shape of your heart, heart muscle function, heart valve function, and fluid buildup around your heart.  You do not need to do anything to prepare before this procedure. You may eat and drink normally.  After the echocardiogram is completed, you may return to your normal, everyday life, unless your health care provider tells you not to do that. This information is not intended to replace advice given to you by your health care provider. Make sure you discuss any questions you have with your health care provider. Document Released: 03/14/2000 Document Revised: 04/19/2016 Document Reviewed: 04/19/2016 Elsevier Interactive Patient Education  2019 [employer].

## 2018-09-07 NOTE — Addendum Note (Signed)
Addended by: Tarri Glenn on: 09/07/2018 02:06 PM   Modules accepted: Orders

## 2018-09-08 ENCOUNTER — Telehealth: Payer: Self-pay

## 2018-09-08 DIAGNOSIS — R0609 Other forms of dyspnea: Secondary | ICD-10-CM | POA: Diagnosis not present

## 2018-09-08 DIAGNOSIS — R7989 Other specified abnormal findings of blood chemistry: Secondary | ICD-10-CM | POA: Insufficient documentation

## 2018-09-08 DIAGNOSIS — R0602 Shortness of breath: Secondary | ICD-10-CM | POA: Diagnosis not present

## 2018-09-08 DIAGNOSIS — I700 Atherosclerosis of aorta: Secondary | ICD-10-CM | POA: Diagnosis not present

## 2018-09-08 DIAGNOSIS — D3502 Benign neoplasm of left adrenal gland: Secondary | ICD-10-CM | POA: Diagnosis not present

## 2018-09-08 DIAGNOSIS — M810 Age-related osteoporosis without current pathological fracture: Secondary | ICD-10-CM | POA: Diagnosis not present

## 2018-09-08 HISTORY — DX: Other specified abnormal findings of blood chemistry: R79.89

## 2018-09-08 LAB — HEPATIC FUNCTION PANEL
ALT: 11 IU/L (ref 0–32)
AST: 16 IU/L (ref 0–40)
Albumin: 3.9 g/dL (ref 3.6–4.6)
Alkaline Phosphatase: 77 IU/L (ref 39–117)
Bilirubin Total: 0.3 mg/dL (ref 0.0–1.2)
Bilirubin, Direct: 0.1 mg/dL (ref 0.00–0.40)
Total Protein: 6.1 g/dL (ref 6.0–8.5)

## 2018-09-08 LAB — BASIC METABOLIC PANEL
BUN/Creatinine Ratio: 24 (ref 12–28)
BUN: 18 mg/dL (ref 8–27)
CO2: 27 mmol/L (ref 20–29)
Calcium: 9.2 mg/dL (ref 8.7–10.3)
Chloride: 97 mmol/L (ref 96–106)
Creatinine, Ser: 0.74 mg/dL (ref 0.57–1.00)
GFR calc Af Amer: 87 mL/min/{1.73_m2} (ref >59)
GFR calc non Af Amer: 75 mL/min/{1.73_m2} (ref >59)
Glucose: 238 mg/dL — ABNORMAL HIGH (ref 65–99)
Potassium: 4.5 mmol/L (ref 3.5–5.2)
Sodium: 135 mmol/L (ref 134–144)

## 2018-09-08 LAB — CBC
Hematocrit: 37.6 % (ref 34.0–46.6)
Hemoglobin: 11.5 g/dL (ref 11.1–15.9)
MCH: 28 pg (ref 26.6–33.0)
MCHC: 30.6 g/dL — ABNORMAL LOW (ref 31.5–35.7)
MCV: 92 fL (ref 79–97)
Platelets: 239 10*3/uL (ref 150–450)
RBC: 4.11 x10E6/uL (ref 3.77–5.28)
RDW: 12.7 % (ref 11.7–15.4)
WBC: 7.7 10*3/uL (ref 3.4–10.8)

## 2018-09-08 LAB — PRO B NATRIURETIC PEPTIDE: NT-Pro BNP: 253 pg/mL (ref 0–738)

## 2018-09-08 LAB — D-DIMER, QUANTITATIVE: D-DIMER: 0.99 mg/L FEU — ABNORMAL HIGH (ref 0.00–0.49)

## 2018-09-08 LAB — TSH: TSH: 1.86 u[IU]/mL (ref 0.450–4.500)

## 2018-09-08 NOTE — Telephone Encounter (Signed)
Called patients daughter Janace Hoard and reviewed results. She is in agreement with taking patient to [place] today for ct Angio. Order filled out, signed by Dr.RRR and given to L.Welch to get pre-certification.

## 2018-09-08 NOTE — Telephone Encounter (Signed)
[name] states swelling in moms legs have gone down some since starting Zaroxolyn. She feels she is urinating more. She has increased her potassium. Her SOB is mainly with exertion. She has been on 2L O2 since 1:30 pm today due to exertion of getting in/out car, hospital, etc. Glucose 345 this morning before breakfast, she is on glimperide. Patient instructed to check BP once a day because she typically checks intermittently during the week.

## 2018-09-08 NOTE — Addendum Note (Signed)
Addended by: Beckey Rutter on: 09/08/2018 08:30 AM   Modules accepted: Orders

## 2018-09-09 ENCOUNTER — Telehealth: Payer: Self-pay

## 2018-09-09 NOTE — Telephone Encounter (Signed)
RN called Dr.Grisso's office and spoke with Verdis Frederickson. Informed of situation and verified fax#. Copy of results sent to her attention.   RN called [name] patients daughter to relay information concerning ct chest results. Patient is weak today because she did not get any rest due to frequent urination/SOB when lying down. She will continue to try and rest today.

## 2018-09-09 NOTE — Telephone Encounter (Signed)
-----   Message from Austin Miles, RN sent at 09/08/2018  9:33 AM EDT -----  ----- Message ----- From: Jenean Lindau, MD Sent: 09/08/2018   8:17 AM EDT To: Mattie Marlin, RN  As discussed labs are fine except elevated d-dimer for which patient needs CT scan with contrast and I understand she is getting one today. Jenean Lindau, MD 09/08/2018 8:17 AM

## 2018-09-09 NOTE — Telephone Encounter (Signed)
Pt's daughter called back.

## 2018-09-09 NOTE — Telephone Encounter (Signed)
Bring her in for a Chem-7 tomorrow.  Let them keep a track of blood pressures 2 or 3 times a day today so we can review it when she comes for blood work tomorrow.  Letter come first thing in the morning so we can also get the results by the end of the day.  Please also check if we have already checked her lipids if not we can add them for tomorrow a liver lipid if not done.

## 2018-09-12 ENCOUNTER — Other Ambulatory Visit (INDEPENDENT_AMBULATORY_CARE_PROVIDER_SITE_OTHER): Payer: Self-pay | Admitting: Orthopedic Surgery

## 2018-09-14 ENCOUNTER — Telehealth: Payer: Self-pay | Admitting: Cardiology

## 2018-09-14 NOTE — Telephone Encounter (Signed)
Has concerns about medications

## 2018-09-14 NOTE — Telephone Encounter (Signed)
Ok. Remind me tomorrow after [employer]

## 2018-09-14 NOTE — Telephone Encounter (Signed)
Spoke with patients daughter and she plans to bring patient in tomorrow morning for repeat labs. Patient would like to discontinue Gevena Cotton because she says her mouth is always dry, she is constantly thirsty and pees all day.Patient would like to go back to lasix. Rn advised that she would forward request to Dr. Docia Furl for advisement.

## 2018-09-15 DIAGNOSIS — R0609 Other forms of dyspnea: Secondary | ICD-10-CM | POA: Diagnosis not present

## 2018-09-15 DIAGNOSIS — I5032 Chronic diastolic (congestive) heart failure: Secondary | ICD-10-CM | POA: Diagnosis not present

## 2018-09-15 LAB — BASIC METABOLIC PANEL
BUN/Creatinine Ratio: 22 (ref 12–28)
BUN: 18 mg/dL (ref 8–27)
CO2: 30 mmol/L — ABNORMAL HIGH (ref 20–29)
Calcium: 9.5 mg/dL (ref 8.7–10.3)
Chloride: 84 mmol/L — ABNORMAL LOW (ref 96–106)
Creatinine, Ser: 0.82 mg/dL (ref 0.57–1.00)
GFR calc Af Amer: 77 mL/min/{1.73_m2} (ref >59)
GFR calc non Af Amer: 66 mL/min/{1.73_m2} (ref >59)
Glucose: 314 mg/dL — ABNORMAL HIGH (ref 65–99)
Potassium: 3.6 mmol/L (ref 3.5–5.2)
Sodium: 128 mmol/L — ABNORMAL LOW (ref 134–144)

## 2018-09-16 NOTE — Telephone Encounter (Signed)
Please review pt most recent BMP and advise if she can discontinue Zaroxolyn?

## 2018-09-17 ENCOUNTER — Telehealth (INDEPENDENT_AMBULATORY_CARE_PROVIDER_SITE_OTHER): Payer: PPO | Admitting: Cardiology

## 2018-09-17 ENCOUNTER — Encounter: Payer: Self-pay | Admitting: Cardiology

## 2018-09-17 ENCOUNTER — Other Ambulatory Visit: Payer: Self-pay

## 2018-09-17 VITALS — BP 114/78 | HR 80 | Ht 63.0 in | Wt 266.0 lb

## 2018-09-17 DIAGNOSIS — R0609 Other forms of dyspnea: Secondary | ICD-10-CM | POA: Diagnosis not present

## 2018-09-17 DIAGNOSIS — I2510 Atherosclerotic heart disease of native coronary artery without angina pectoris: Secondary | ICD-10-CM

## 2018-09-17 DIAGNOSIS — E782 Mixed hyperlipidemia: Secondary | ICD-10-CM

## 2018-09-17 NOTE — Progress Notes (Signed)
Virtual Visit via Video Note   This visit type was conducted due to national recommendations for restrictions regarding the COVID-19 Pandemic (e.g. social distancing) in an effort to limit this patient's exposure and mitigate transmission in our community.  Due to her co-morbid illnesses, this patient is at least at moderate risk for complications without adequate follow up.  This format is felt to be most appropriate for this patient at this time.  All issues noted in this document were discussed and addressed.  A limited physical exam was performed with this format.  Please refer to the patient's chart for her consent to telehealth for [facility].   Date:  09/17/2018   ID:  Danielle [name], DOB 1934-01-02, MRN [id number]  Patient Location: Home Provider Location: Home  PCP:  [name], MD  Cardiologist:  No primary care provider on file.  Electrophysiologist:  None   Evaluation Performed:  Follow-Up Visit  Chief Complaint: Dyspnea on exertion  History of Present Illness:    Danielle [name] is a 85 y.o. female with past medical history of coronary artery disease.  Patient mentions to me that many years ago she underwent coronary angiography at [facility] hospital and was told to have non obstructive disease of up to 40% stenosis in 1 of the arteries.  She has essential hypertension, ex-heavy smoker, COPD and essentially is being evaluated for dyspnea on exertion.  I changed her diuretic to metolazone and she has felt some better.  She denies any chest pain.  Her weight is down by 7 pounds according to the patient.  She is morbidly obese and leads a sedentary lifestyle.  Her d-dimer was elevated and a CT scan did not reveal any evidence of pulmonary thromboembolism.  At the time of my evaluation, the patient is alert awake oriented and in no distress.  She continues to have significant shortness of breath on exertion and is going to see a pulmonologist in the  next few days.  Her echocardiogram is pending in the next few days also.  The patient does not have symptoms concerning for COVID-19 infection (fever, chills, cough, or new shortness of breath).    Past Medical History:  Diagnosis Date   Anginal pain ([place])    Arthritis    Asthma    CHF (congestive heart failure) ([facility])    COPD (chronic obstructive pulmonary disease) ([facility])    Coronary artery disease    Depression    GERD (gastroesophageal reflux disease)    Headache    History of hiatal hernia    Hyperlipidemia    Hypertension    Insomnia    Osteoporosis    Pneumonia 2013   PONV (postoperative nausea and vomiting)    Shortness of breath dyspnea    with exertion   Past Surgical History:  Procedure Laterality Date   ABDOMINAL HYSTERECTOMY     APPENDECTOMY     BREAST SURGERY  40 yrs. ago   growth removed in each breast-benign   CARDIAC CATHETERIZATION     12/14/12 LHC ([facility]): few mild stenotic areas max 40% of ectatic RCA o/w NL coronaries, EF 65%. Med tx.   CATARACT EXTRACTION W/ INTRAOCULAR LENS  IMPLANT, BILATERAL Bilateral 15 yrs ago   CHOLECYSTECTOMY     FOOT SURGERY     LAPAROSCOPIC APPENDECTOMY N/A 02/19/2015   Procedure: EXCISION OF MESENTERIC MASS;  Surgeon: Stark Klein, MD;  Location: [place];  Service: General;  Laterality: N/A;   REPLACEMENT TOTAL KNEE  BILATERAL       Current Meds  Medication Sig   albuterol (PROVENTIL HFA;VENTOLIN HFA) 108 (90 BASE) MCG/ACT inhaler Inhale 2 puffs into the lungs every 6 (six) hours as needed.   aspirin 81 MG tablet Take 81 mg by mouth daily.   budesonide-formoterol (SYMBICORT) 160-4.5 MCG/ACT inhaler Inhale 2 puffs into the lungs 2 (two) times daily.   citalopram (CELEXA) 20 MG tablet Take 20 mg by mouth daily.   clonazePAM (KLONOPIN) 1 MG tablet Take 1 mg by mouth as needed (sleep).    diclofenac sodium (VOLTAREN) 1 % GEL APPLY 2 GRAMS  TOPICALLY THREE TIMES DAILY   glimepiride (AMARYL) 1 MG  tablet Take 1 mg by mouth daily.   metolazone (ZAROXOLYN) 5 MG tablet Take 1 tablet (5 mg total) by mouth daily.   metoprolol succinate (TOPROL-XL) 25 MG 24 hr tablet Take 25 mg by mouth daily.   pantoprazole (PROTONIX) 40 MG tablet Take 40 mg by mouth 2 (two) times daily.   potassium chloride (MICRO-K) 10 MEQ CR capsule Take 10 mEq by mouth daily.   rOPINIRole (REQUIP) 0.25 MG tablet Take 0.25 mg by mouth at bedtime.   rosuvastatin (CRESTOR) 40 MG tablet Take 40 mg by mouth daily.   Vitamin D, Ergocalciferol, (DRISDOL) 50000 UNITS CAPS capsule Take 50,000 Units by mouth every Sunday. No specific days     Allergies:   Codeine, Levofloxacin, Penicillins, and Tape   Social History   Tobacco Use   Smoking status: Former Smoker    Packs/day: 0.25    Types: Cigarettes    Quit date: 08/21/2016    Years since quitting: 2.0   Smokeless tobacco: Never Used  Substance Use Topics   Alcohol use: No   Drug use: No     Family Hx: The patient's Family history is unknown by patient.  ROS:   Please see the history of present illness.    As mentioned above All other systems reviewed and are negative.   Prior CV studies:   The following studies were reviewed today:  I reviewed blood work and CT scan report  Labs/Other Tests and Data Reviewed:    EKG:  No ECG reviewed.  Recent Labs: 09/07/2018: ALT 11; Hemoglobin 11.5; NT-Pro BNP 253; Platelets 239; TSH 1.860 09/15/2018: BUN 18; Creatinine, Ser 0.82; Potassium 3.6; Sodium 128   Recent Lipid Panel No results found for: CHOL, TRIG, HDL, CHOLHDL, LDLCALC, LDLDIRECT  Wt Readings from Last 3 Encounters:  09/17/18 266 lb (120.7 kg)  09/07/18 273 lb (123.8 kg)  05/14/18 260 lb (117.9 kg)     Objective:    Vital Signs:  BP 114/78 (BP Location: Left Arm, Patient Position: Sitting, Cuff Size: Normal)    Pulse 80    Ht 5\' 3"  (1.6 m)    Wt 266 lb (120.7 kg)    BMI 47.12 kg/m    VITAL SIGNS:  reviewed  ASSESSMENT & PLAN:     1. Dyspnea on exertion: This is significant.  Patient is on diuretics and has done well.  She is feeling better.  Her sodium and chloride are low.  I have asked her to stop her Zaroxolyn and she will switch to furosemide 40 mg in the morning only.  She will be back in 1 week for a Chem-7.  Also echocardiogram will be pending and done in the next few days. 2. In view of her significant symptoms I would like to set her up for a CT coronary angiography to evaluate  the status of her coronary arteries. 3. She knows to go to the nearest emergency room for any significant concerns.  She will be seen in follow-up appointment in a month or earlier if she has any concerns.  COVID-19 Education: The signs and symptoms of COVID-19 were discussed with the patient and how to seek care for testing (follow up with PCP or arrange E-visit).  The importance of social distancing was discussed today.  Time:   Today, I have spent 15 minutes with the patient with telehealth technology discussing the above problems.     Medication Adjustments/Labs and Tests Ordered: Current medicines are reviewed at length with the patient today.  Concerns regarding medicines are outlined above.   Tests Ordered: No orders of the defined types were placed in this encounter.   Medication Changes: No orders of the defined types were placed in this encounter.   Follow Up:  Virtual Visit or In Person in 1 month(s)  Signed, Jenean Lindau, MD  09/17/2018 10:44 AM    [place]

## 2018-09-17 NOTE — Addendum Note (Signed)
Addended by: Beckey Rutter on: 09/17/2018 11:23 AM   Modules accepted: Orders

## 2018-09-17 NOTE — Patient Instructions (Addendum)
Medication Instructions:  Your physician has recommended you make the following change in your medication:  STOP taking Zaroxolyn Start taking furosemide 40 mg (1 tablet) once in the morning.  If you need a refill on your cardiac medications before your next appointment, please call your pharmacy.   Lab work: You will need to come in 7 days prior to procedure to have a BMP drawn.  If you have labs (blood work) drawn today and your tests are completely normal, you will receive your results only by: Marland Kitchen MyChart Message (if you have MyChart) OR . A paper copy in the mail If you have any lab test that is abnormal or we need to change your treatment, we will call you to review the results.  Testing/Procedures: Non-Cardiac CT scanning, (CAT scanning), is a noninvasive, special x-ray that produces cross-sectional images of the body using x-rays and a computer. CT scans help physicians diagnose and treat medical conditions. For some CT exams, a contrast material is used to enhance visibility in the area of the body being studied. CT scans provide greater clarity and reveal more details than regular x-ray exams.  Please arrive at the [facility] main entrance of [facility] at xx:xx AM (30-45 minutes prior to test start time)  [facility] [place], [place] [id number] [phone]  Proceed to the [facility] Radiology Department (First Floor).  Please follow these instructions carefully (unless otherwise directed):  On the Night Before the Test: . Be sure to Drink plenty of water. . Do not consume any caffeinated/decaffeinated beverages or chocolate 12 hours prior to your test. . Do not take any antihistamines 12 hours prior to your test.  On the Day of the Test: . Drink plenty of water. Do not drink any water within one hour of the test. . Do not eat any food 4 hours prior to the test. . You may take your regular medications prior to the test.  . Take your  metoprolol (Lopressor) two hours prior to test. . HOLD Furosemide/Hydrochlorothiazide morning of the test.      After the Test: . Drink plenty of water. . After receiving IV contrast, you may experience a mild flushed feeling. This is normal. . On occasion, you may experience a mild rash up to 24 hours after the test. This is not dangerous. If this occurs, you can take Benadryl 25 mg and increase your fluid intake. . If you experience trouble breathing, this can be serious. If it is severe call 911 IMMEDIATELY. If it is mild, please call our office. . If you take any of these medications: glimeperide please do not take 48 hours after completing test.   Follow-Up: At [facility], you and your health needs are our priority.  As part of our continuing mission to provide you with exceptional heart care, we have created designated Provider Care Teams.  These Care Teams include your primary Cardiologist (physician) and Advanced Practice Providers (APPs -  Physician Assistants and Nurse Practitioners) who all work together to provide you with the care you need, when you need it. You will need a follow up appointment in 1 months.    Any Other Special Instructions Will Be Listed Below   Coronary Angiogram A coronary angiogram is an X-ray procedure that is used to examine the arteries in the heart. In this procedure, a dye (contrast dye) is injected through a long, thin tube (catheter). The catheter is inserted through the groin, wrist, or  arm. The dye is injected into each artery, then X-rays are taken to show if there is a blockage in the arteries of the heart. This procedure can also show if you have valve disease or a disease of the aorta, and it can be used to check the overall function of your heart muscle. You may have a coronary angiogram if:  You are having chest pain, or other symptoms of angina, and you are at risk for heart disease.  You have an abnormal electrocardiogram (ECG) or stress  test.  You have chest pain and heart failure.  You are having irregular heart rhythms.  You and your health care provider determine that the benefits of the test information outweigh the risks of the procedure. Let your health care provider know about:  Any allergies you have, including allergies to contrast dye.  All medicines you are taking, including vitamins, herbs, eye drops, creams, and over-the-counter medicines.  Any problems you or family members have had with anesthetic medicines.  Any blood disorders you have.  Any surgeries you have had.  History of kidney problems or kidney failure.  Any medical conditions you have.  Whether you are pregnant or may be pregnant. What are the risks? Generally, this is a safe procedure. However, problems may occur, including:  Infection.  Allergic reaction to medicines or dyes that are used.  Bleeding from the access site or other locations.  Kidney injury, especially in people with impaired kidney function.  Stroke (rare).  Heart attack (rare).  Damage to other structures or organs. What happens before the procedure? Staying hydrated Follow instructions from your health care provider about hydration, which may include:  Up to 2 hours before the procedure - you may continue to drink clear liquids, such as water, clear fruit juice, black coffee, and plain tea. Eating and drinking restrictions Follow instructions from your health care provider about eating and drinking, which may include:  8 hours before the procedure - stop eating heavy meals or foods such as meat, fried foods, or fatty foods.  6 hours before the procedure - stop eating light meals or foods, such as toast or cereal.  2 hours before the procedure - stop drinking clear liquids. General instructions  Ask your health care provider about: ? Changing or stopping your regular medicines. This is especially important if you are taking diabetes medicines or  blood thinners. ? Taking medicines such as ibuprofen. These medicines can thin your blood. Do not take these medicines before your procedure if your health care provider instructs you not to, though aspirin may be recommended prior to coronary angiograms.  Plan to have someone take you home from the hospital or clinic.  You may need to have blood tests or X-rays done. What happens during the procedure?  An IV tube will be inserted into one of your veins.  You will be given one or more of the following: ? A medicine to help you relax (sedative). ? A medicine to numb the area where the catheter will be inserted into an artery (local anesthetic).  To reduce your risk of infection: ? Your health care team will wash or sanitize their hands. ? Your skin will be washed with soap. ? Hair may be removed from the area where the catheter will be inserted.  You will be connected to a continuous ECG monitor.  The catheter will be inserted into an artery. The location may be in your groin, in your wrist, or in the fold  of your arm (near your elbow).  A type of X-ray (fluoroscopy) will be used to help guide the catheter to the opening of the blood vessel that is being examined.  A dye will be injected into the catheter, and X-rays will be taken. The dye will help to show where any narrowing or blockages are located in the heart arteries.  Tell your health care provider if you have any chest pain or trouble breathing during the procedure.  If blockages are found, your health care provider may perform another procedure, such as inserting a coronary stent. The procedure may vary among health care providers and hospitals. What happens after the procedure?  After the procedure, you will need to keep the area still for a few hours, or for as long as told by your health care provider. If the procedure is done through the groin, you will be instructed to not bend and not cross your legs.  The insertion  site will be checked frequently.  The pulse in your foot or wrist will be checked frequently.  You may have additional blood tests, X-rays, and a test that records the electrical activity of your heart (ECG).  Do not drive for 24 hours if you were given a sedative. Summary  A coronary angiogram is an X-ray procedure that is used to look into the arteries in the heart.  During the procedure, a dye (contrast dye) is injected through a long, thin tube (catheter). The catheter is inserted through the groin, wrist, or arm.  Tell your health care provider about any allergies you have, including allergies to contrast dye.  After the procedure, you will need to keep the area still for a few hours, or for as long as told by your health care provider. This information is not intended to replace advice given to you by your health care provider. Make sure you discuss any questions you have with your health care provider. Document Released: 09/21/2002 Document Revised: 12/28/2015 Document Reviewed: 12/28/2015 Elsevier Interactive Patient Education  2019 [employer].

## 2018-09-20 NOTE — Addendum Note (Signed)
Addended by: Beckey Rutter on: 09/20/2018 09:49 AM   Modules accepted: Orders

## 2018-09-21 ENCOUNTER — Telehealth: Payer: PPO | Admitting: Cardiology

## 2018-09-25 ENCOUNTER — Other Ambulatory Visit (INDEPENDENT_AMBULATORY_CARE_PROVIDER_SITE_OTHER): Payer: Self-pay | Admitting: Orthopedic Surgery

## 2018-09-30 ENCOUNTER — Telehealth: Payer: Self-pay | Admitting: *Deleted

## 2018-09-30 NOTE — Telephone Encounter (Signed)
Needs lab work and has not gotten instructions mailed to her before cardiac ct. PLease advise.

## 2018-09-30 NOTE — Telephone Encounter (Signed)
Patient will come in on Monday for BMP and RN will give her copy of needed paperwork.

## 2018-10-04 DIAGNOSIS — R0609 Other forms of dyspnea: Secondary | ICD-10-CM | POA: Diagnosis not present

## 2018-10-04 DIAGNOSIS — I2510 Atherosclerotic heart disease of native coronary artery without angina pectoris: Secondary | ICD-10-CM | POA: Diagnosis not present

## 2018-10-04 LAB — BASIC METABOLIC PANEL
BUN/Creatinine Ratio: 39 — ABNORMAL HIGH (ref 12–28)
BUN: 30 mg/dL — ABNORMAL HIGH (ref 8–27)
CO2: 24 mmol/L (ref 20–29)
Calcium: 9.1 mg/dL (ref 8.7–10.3)
Chloride: 96 mmol/L (ref 96–106)
Creatinine, Ser: 0.77 mg/dL (ref 0.57–1.00)
GFR calc Af Amer: 83 mL/min/{1.73_m2} (ref >59)
GFR calc non Af Amer: 72 mL/min/{1.73_m2} (ref >59)
Glucose: 255 mg/dL — ABNORMAL HIGH (ref 65–99)
Potassium: 4.8 mmol/L (ref 3.5–5.2)
Sodium: 133 mmol/L — ABNORMAL LOW (ref 134–144)

## 2018-10-06 ENCOUNTER — Other Ambulatory Visit: Payer: PPO

## 2018-10-06 ENCOUNTER — Telehealth: Payer: Self-pay | Admitting: Cardiology

## 2018-10-06 DIAGNOSIS — J439 Emphysema, unspecified: Secondary | ICD-10-CM | POA: Diagnosis not present

## 2018-10-06 DIAGNOSIS — I509 Heart failure, unspecified: Secondary | ICD-10-CM | POA: Diagnosis not present

## 2018-10-06 DIAGNOSIS — J4520 Mild intermittent asthma, uncomplicated: Secondary | ICD-10-CM | POA: Diagnosis not present

## 2018-10-06 NOTE — Telephone Encounter (Signed)
Need bloodwork results and has questions

## 2018-10-06 NOTE — Telephone Encounter (Signed)
-----   Message from Jenean Lindau, MD sent at 10/04/2018  4:47 PM EDT ----- The results of the study is unremarkable. Please inform patient. I will discuss in detail at next appointment. Cc  primary care/referring physician Jenean Lindau, MD 10/04/2018 4:47 PM

## 2018-10-06 NOTE — Telephone Encounter (Signed)
Results relayed to patient and went over procedure information for CT coronary on Monday. Copy of results sent to Dr. Camillo Flaming per Dr. Docia Furl request.

## 2018-10-11 ENCOUNTER — Ambulatory Visit (HOSPITAL_COMMUNITY)
Admission: RE | Admit: 2018-10-11 | Discharge: 2018-10-11 | Disposition: A | Discharge status: Home / Self Care | Payer: PPO | Source: Ambulatory Visit | Attending: Cardiology | Admitting: Cardiology

## 2018-10-11 ENCOUNTER — Other Ambulatory Visit: Payer: Self-pay

## 2018-10-11 ENCOUNTER — Encounter (HOSPITAL_COMMUNITY): Payer: Self-pay

## 2018-10-11 DIAGNOSIS — I2510 Atherosclerotic heart disease of native coronary artery without angina pectoris: Secondary | ICD-10-CM

## 2018-10-11 DIAGNOSIS — R0609 Other forms of dyspnea: Secondary | ICD-10-CM

## 2018-10-11 MED ORDER — IOHEXOL 350 MG/ML SOLN
80.0000 mL | Freq: Once | INTRAVENOUS | Status: AC | PRN
  Administered 2018-10-11: 80 mL via INTRAVENOUS

## 2018-10-11 MED ORDER — NITROGLYCERIN 0.4 MG SL SUBL
SUBLINGUAL_TABLET | SUBLINGUAL | Status: AC
  Administered 2018-10-11: 0.8 mg via SUBLINGUAL
  Filled 2018-10-11: qty 2

## 2018-10-11 MED ORDER — NITROGLYCERIN 0.4 MG SL SUBL
0.8000 mg | SUBLINGUAL_TABLET | Freq: Once | SUBLINGUAL | Status: AC
  Administered 2018-10-11: 15:00:00 0.8 mg via SUBLINGUAL
  Filled 2018-10-11: qty 25

## 2018-10-11 NOTE — Discharge Instructions (Signed)
Testing With IV Contrast Material °IV contrast material is a fluid that is used with some imaging tests. It is injected into your body through a vein. Contrast material is used when your health care providers need a detailed look at organs, tissues, or blood vessels that may not show up with the standard test. The material may be used when an X-ray, an MRI, a CT scan, or an ultrasound is done. °IV contrast material may be used for imaging tests that check: °· Muscles, skin, and fat. °· Breasts. °· Brain. °· Digestive tract. °· Heart. °· Organs such as the liver, kidneys, lungs, bladder, and many others. °· Arteries and veins. °Tell a health care provider about: °· Any allergies you have, especially an allergy to contrast material. °· All medicines you are taking, including metformin, beta blockers, NSAIDs (such as ibuprofen), interleukin-2, vitamins, herbs, eye drops, creams, and over-the-counter medicines. °· Any problems you or family members have had with the use of contrast material. °· Any blood disorders you have, such as sickle cell anemia. °· Any surgeries you have had. °· Any medical conditions you have or have had, especially alcohol abuse, dehydration, asthma, or kidney, liver, or heart problems. °· Whether you are pregnant or may be pregnant. °· Whether you are breastfeeding. Most contrast materials are safe for use in breastfeeding women. °What are the risks? °Generally, this is a safe procedure. However, problems may occur, including: °· Headache. °· Itching, skin rash, and hives. °· Nausea and vomiting. °· Allergic reactions. °· Wheezing or difficulty breathing. °· Abnormal heart rate. °· Changes in blood pressure. °· Throat swelling. °· Kidney damage. °What happens before the procedure? °Medicines °Ask your health care provider about: °· Changing or stopping your regular medicines. This is especially important if you are taking diabetes medicines or blood thinners. °· Taking medicines such as aspirin  and ibuprofen. These medicines can thin your blood. Do not take these medicines unless your health care provider tells you to take them. °· Taking over-the-counter medicines, vitamins, herbs, and supplements. °If you are at risk of having a reaction to the IV contrast material, you may be asked to take medicine before the procedure to prevent a reaction. °General instructions °· Follow instructions from your health care provider about eating or drinking restrictions. °· You may have an exam or lab tests to make sure that you can safely get IV contrast material. °· Ask if you will be given a medicine to help you relax (sedative) during the procedure. If so, plan to have someone take you home from the hospital or clinic. °What happens during the procedure? °· You may be given a sedative to help you relax. °· An IV will be inserted into one of your veins. °· Contrast material will be injected into your IV. °· You may feel warmth or flushing as the contrast material enters your bloodstream. °· You may have a metallic taste in your mouth for a few minutes. °· The needle may cause some discomfort and bruising. °· After the contrast material is in your body, the imaging test will be done. °The procedure may vary among health care providers and hospitals. °What can I expect after the procedure? °· The IV will be removed. °· You may be taken to a recovery area if sedation medicines were used. Your blood pressure, heart rate, breathing rate, and blood oxygen level will be monitored until you leave the hospital or clinic. °Follow these instructions at home: ° °· Take over-the-counter and   prescription medicines only as told by your health care provider. °? Your health care provider may tell you to not take certain medicines for a couple of days after the procedure. This is especially important if you are taking diabetes medicines. °· If you are told, drink enough fluid to keep your urine pale yellow. This will help to remove  the contrast material out of your body. °· Do not drive for 24 hours if you were given a sedative during your procedure. °· It is up to you to get the results of your procedure. Ask your health care provider, or the department that is doing the procedure, when your results will be ready. °· Keep all follow-up visits as told by your health care provider. This is important. °Contact a health care provider if: °· You have redness, swelling, or pain near your IV site. °Get help right away if: °· You have an abnormal heart rhythm. °· You have trouble breathing. °· You have: °? Chest pain. °? Pain in your back, neck, arm, jaw, or stomach. °? Nausea or sweating. °? Hives or a rash. °· You start shaking and cannot stop. °These symptoms may represent a serious problem that is an emergency. Do not wait to see if the symptoms will go away. Get medical help right away. Call your local emergency services (911 in the [place] Do not drive yourself to the hospital. °Summary °· IV contrast material may be used for imaging tests to help your health care providers see your organs and tissues more clearly. °· Tell your health care provider if you are pregnant or may be pregnant. °· During the procedure, you may feel warmth or flushing as the contrast material enters your bloodstream. °· After the procedure, drink enough fluid to keep your urine pale yellow. °This information is not intended to replace advice given to you by your health care provider. Make sure you discuss any questions you have with your health care provider. °Document Released: 03/05/2009 Document Revised: 06/03/2018 Document Reviewed: 06/03/2018 °Elsevier Patient Education © 2020 Else[employer] Inc[employer] ° ° °Cardiac CT Angiogram ° °A cardiac CT angiogram is a procedure to look at the heart and the area around the heart. It may be done to help find the cause of chest pains or other symptoms of heart disease. During this procedure, a large X-ray machine, called a CT scanner,  takes detailed pictures of the heart and the surrounding area after a dye (contrast material) has been injected into blood vessels in the area. The procedure is also sometimes called a coronary CT angiogram, coronary artery scanning, or CTA. °A cardiac CT angiogram allows the health care provider to see how well blood is flowing to and from the heart. The health care provider will be able to see if there are any problems, such as: °· Blockage or narrowing of the coronary arteries in the heart. °· Fluid around the heart. °· Signs of weakness or disease in the muscles, valves, and tissues of the heart. °Tell a health care provider about: °· Any allergies you have. This is especially important if you have had a previous allergic reaction to contrast dye. °· All medicines you are taking, including vitamins, herbs, eye drops, creams, and over-the-counter medicines. °· Any blood disorders you have. °· Any surgeries you have had. °· Any medical conditions you have. °· Whether you are pregnant or may be pregnant. °· Any anxiety disorders, chronic pain, or other conditions you have that may increase your stress or prevent   you from lying still. °What are the risks? °Generally, this is a safe procedure. However, problems may occur, including: °· Bleeding. °· Infection. °· Allergic reactions to medicines or dyes. °· Damage to other structures or organs. °· Kidney damage from the dye or contrast that is used. °· Increased risk of cancer from radiation exposure. This risk is low. Talk with your health care provider about: °? The risks and benefits of testing. °? How you can receive the lowest dose of radiation. °What happens before the procedure? °· Wear comfortable clothing and remove any jewelry, glasses, dentures, and hearing aids. °· Follow instructions from your health care provider about eating and drinking. This may include: °? For 12 hours before the test -- avoid caffeine. This includes tea, coffee, soda, energy drinks,  and diet pills. Drink plenty of water or other fluids that do not have caffeine in them. Being well-hydrated can prevent complications. °? For 4-6 hours before the test -- stop eating and drinking. The contrast dye can cause nausea, but this is less likely if your stomach is empty. °· Ask your health care provider about changing or stopping your regular medicines. This is especially important if you are taking diabetes medicines, blood thinners, or medicines to treat erectile dysfunction. °What happens during the procedure? °· Hair on your chest may need to be removed so that small sticky patches called electrodes can be placed on your chest. These will transmit information that helps to monitor your heart during the test. °· An IV tube will be inserted into one of your veins. °· You might be given a medicine to control your heart rate during the test. This will help to ensure that good images are obtained. °· You will be asked to lie on an exam table. This table will slide in and out of the CT machine during the procedure. °· Contrast dye will be injected into the IV tube. You might feel warm, or you may get a metallic taste in your mouth. °· You will be given a medicine (nitroglycerin) to relax (dilate) the arteries in your heart. °· The table that you are lying on will move into the CT machine tunnel for the scan. °· The person running the machine will give you instructions while the scans are being done. You may be asked to: °? Keep your arms above your head. °? Hold your breath. °? Stay very still, even if the table is moving. °· When the scanning is complete, you will be moved out of the machine. °· The IV tube will be removed. °The procedure may vary among health care providers and hospitals. °What happens after the procedure? °· You might feel warm, or you may get a metallic taste in your mouth from the contrast dye. °· You may have a headache from the nitroglycerin. °· After the procedure, drink water or  other fluids to wash (flush) the contrast material out of your body. °· Contact a health care provider if you have any symptoms of allergy to the contrast. These symptoms include: °? Shortness of breath. °? Rash or hives. °? A racing heartbeat. °· Most people can return to their normal activities right after the procedure. Ask your health care provider what activities are safe for you. °· It is up to you to get the results of your procedure. Ask your health care provider, or the department that is doing the procedure, when your results will be ready. °Summary °· A cardiac CT angiogram is a procedure to   look at the heart and the area around the heart. It may be done to help find the cause of chest pains or other symptoms of heart disease. °· During this procedure, a large X-ray machine, called a CT scanner, takes detailed pictures of the heart and the surrounding area after a dye (contrast material) has been injected into blood vessels in the area. °· Ask your health care provider about changing or stopping your regular medicines before the procedure. This is especially important if you are taking diabetes medicines, blood thinners, or medicines to treat erectile dysfunction. °· After the procedure, drink water or other fluids to wash (flush) the contrast material out of your body. °This information is not intended to replace advice given to you by your health care provider. Make sure you discuss any questions you have with your health care provider. °Document Released: 02/28/2008 Document Revised: 02/27/2017 Document Reviewed: 02/04/2016 °Elsevier Patient Education © 2020 Else[employer] Inc. ° °

## 2018-10-11 NOTE — Progress Notes (Signed)
Pt tolerated exam without incident.  PIV removed and dressing applied.  Discharge instructions discussed.  Pt discharged

## 2018-10-12 DIAGNOSIS — I2510 Atherosclerotic heart disease of native coronary artery without angina pectoris: Secondary | ICD-10-CM | POA: Diagnosis not present

## 2018-10-13 ENCOUNTER — Telehealth: Payer: Self-pay | Admitting: *Deleted

## 2018-10-13 DIAGNOSIS — I2510 Atherosclerotic heart disease of native coronary artery without angina pectoris: Secondary | ICD-10-CM

## 2018-10-13 DIAGNOSIS — E782 Mixed hyperlipidemia: Secondary | ICD-10-CM

## 2018-10-13 NOTE — Telephone Encounter (Signed)
-----   Message from Jenean Lindau, MD sent at 10/13/2018  7:50 AM EDT ----- Nonobstructive blockage in the coronary arteries need a liver lipid check.  So I can intensify statin therapy.  Diet. Jenean Lindau, MD 10/13/2018 7:49 AM

## 2018-10-13 NOTE — Telephone Encounter (Signed)
Telephone call to patient. Informed of CTa results and need for liver and lipid check before Dr. Geraldo Pitter adjusts statin. Patient verbalized understanding . Labs entered into sytem.

## 2018-10-14 ENCOUNTER — Telehealth: Payer: Self-pay

## 2018-10-14 NOTE — Telephone Encounter (Signed)
Patient called and notified of test results. 

## 2018-10-14 NOTE — Telephone Encounter (Signed)
-----   Message from Jenean Lindau, MD sent at 10/13/2018  7:48 AM EDT ----- The results of the study is unremarkable. Please inform patient. I will discuss in detail at next appointment. Cc  primary care/referring physician Jenean Lindau, MD 10/13/2018 7:48 AM

## 2018-10-22 ENCOUNTER — Other Ambulatory Visit: Payer: Self-pay

## 2018-10-22 ENCOUNTER — Ambulatory Visit (INDEPENDENT_AMBULATORY_CARE_PROVIDER_SITE_OTHER): Payer: PPO

## 2018-10-22 ENCOUNTER — Ambulatory Visit: Payer: PPO | Admitting: Cardiology

## 2018-10-22 DIAGNOSIS — E782 Mixed hyperlipidemia: Secondary | ICD-10-CM | POA: Diagnosis not present

## 2018-10-22 DIAGNOSIS — R0609 Other forms of dyspnea: Secondary | ICD-10-CM | POA: Diagnosis not present

## 2018-10-22 DIAGNOSIS — I2510 Atherosclerotic heart disease of native coronary artery without angina pectoris: Secondary | ICD-10-CM | POA: Diagnosis not present

## 2018-10-22 LAB — HEPATIC FUNCTION PANEL
ALT: 10 IU/L (ref 0–32)
AST: 17 IU/L (ref 0–40)
Albumin: 4 g/dL (ref 3.6–4.6)
Alkaline Phosphatase: 87 IU/L (ref 39–117)
Bilirubin Total: 0.3 mg/dL (ref 0.0–1.2)
Bilirubin, Direct: 0.1 mg/dL (ref 0.00–0.40)
Total Protein: 6 g/dL (ref 6.0–8.5)

## 2018-10-22 LAB — LIPID PANEL
Chol/HDL Ratio: 3.4 ratio (ref 0.0–4.4)
Cholesterol, Total: 134 mg/dL (ref 100–199)
HDL: 40 mg/dL (ref >39)
LDL Calculated: 67 mg/dL (ref 0–99)
Triglycerides: 135 mg/dL (ref 0–149)
VLDL Cholesterol Cal: 27 mg/dL (ref 5–40)

## 2018-10-22 NOTE — Progress Notes (Signed)
Complete echocardiogram has been performed.  Jimmy Alyne Martinson RDCS, RVT 

## 2018-10-25 ENCOUNTER — Telehealth: Payer: Self-pay

## 2018-10-25 NOTE — Telephone Encounter (Signed)
-----   Message from Jenean Lindau, MD sent at 10/22/2018  4:55 PM EDT ----- The results of the study is unremarkable. Please inform patient. I will discuss in detail at next appointment. Cc  primary care/referring physician Jenean Lindau, MD 10/22/2018 4:55 PM

## 2018-10-25 NOTE — Telephone Encounter (Signed)
Patient called and notified of lab and test results.

## 2018-10-29 ENCOUNTER — Observation Stay (HOSPITAL_COMMUNITY)
Admission: EM | Admit: 2018-10-29 | Discharge: 2018-10-30 | Disposition: A | Discharge status: Home / Self Care | Payer: PPO | Attending: Internal Medicine | Admitting: Internal Medicine

## 2018-10-29 ENCOUNTER — Emergency Department (HOSPITAL_COMMUNITY): Payer: PPO

## 2018-10-29 ENCOUNTER — Ambulatory Visit: Payer: PPO | Admitting: Cardiology

## 2018-10-29 ENCOUNTER — Encounter (HOSPITAL_COMMUNITY): Payer: Self-pay | Admitting: *Deleted

## 2018-10-29 ENCOUNTER — Other Ambulatory Visit: Payer: Self-pay

## 2018-10-29 DIAGNOSIS — D649 Anemia, unspecified: Secondary | ICD-10-CM | POA: Diagnosis not present

## 2018-10-29 DIAGNOSIS — J449 Chronic obstructive pulmonary disease, unspecified: Secondary | ICD-10-CM | POA: Insufficient documentation

## 2018-10-29 DIAGNOSIS — E1122 Type 2 diabetes mellitus with diabetic chronic kidney disease: Secondary | ICD-10-CM

## 2018-10-29 DIAGNOSIS — N183 Chronic kidney disease, stage 3 (moderate): Secondary | ICD-10-CM | POA: Insufficient documentation

## 2018-10-29 DIAGNOSIS — I5032 Chronic diastolic (congestive) heart failure: Secondary | ICD-10-CM | POA: Insufficient documentation

## 2018-10-29 DIAGNOSIS — Z87891 Personal history of nicotine dependence: Secondary | ICD-10-CM | POA: Insufficient documentation

## 2018-10-29 DIAGNOSIS — R079 Chest pain, unspecified: Secondary | ICD-10-CM | POA: Diagnosis not present

## 2018-10-29 DIAGNOSIS — J45909 Unspecified asthma, uncomplicated: Secondary | ICD-10-CM | POA: Diagnosis not present

## 2018-10-29 DIAGNOSIS — R069 Unspecified abnormalities of breathing: Secondary | ICD-10-CM | POA: Diagnosis not present

## 2018-10-29 DIAGNOSIS — I2510 Atherosclerotic heart disease of native coronary artery without angina pectoris: Secondary | ICD-10-CM | POA: Diagnosis not present

## 2018-10-29 DIAGNOSIS — E785 Hyperlipidemia, unspecified: Secondary | ICD-10-CM | POA: Diagnosis not present

## 2018-10-29 DIAGNOSIS — R0789 Other chest pain: Secondary | ICD-10-CM | POA: Diagnosis present

## 2018-10-29 DIAGNOSIS — E669 Obesity, unspecified: Secondary | ICD-10-CM | POA: Insufficient documentation

## 2018-10-29 DIAGNOSIS — I491 Atrial premature depolarization: Secondary | ICD-10-CM | POA: Diagnosis not present

## 2018-10-29 DIAGNOSIS — E119 Type 2 diabetes mellitus without complications: Secondary | ICD-10-CM | POA: Diagnosis not present

## 2018-10-29 DIAGNOSIS — E1165 Type 2 diabetes mellitus with hyperglycemia: Secondary | ICD-10-CM | POA: Diagnosis not present

## 2018-10-29 DIAGNOSIS — F329 Major depressive disorder, single episode, unspecified: Secondary | ICD-10-CM | POA: Insufficient documentation

## 2018-10-29 DIAGNOSIS — I10 Essential (primary) hypertension: Secondary | ICD-10-CM | POA: Diagnosis not present

## 2018-10-29 DIAGNOSIS — J90 Pleural effusion, not elsewhere classified: Secondary | ICD-10-CM | POA: Diagnosis not present

## 2018-10-29 DIAGNOSIS — K219 Gastro-esophageal reflux disease without esophagitis: Secondary | ICD-10-CM | POA: Insufficient documentation

## 2018-10-29 DIAGNOSIS — F32A Depression, unspecified: Secondary | ICD-10-CM | POA: Diagnosis present

## 2018-10-29 DIAGNOSIS — Z20828 Contact with and (suspected) exposure to other viral communicable diseases: Secondary | ICD-10-CM | POA: Diagnosis not present

## 2018-10-29 DIAGNOSIS — I200 Unstable angina: Secondary | ICD-10-CM | POA: Diagnosis not present

## 2018-10-29 DIAGNOSIS — N1831 Chronic kidney disease, stage 3a: Secondary | ICD-10-CM | POA: Diagnosis present

## 2018-10-29 DIAGNOSIS — E782 Mixed hyperlipidemia: Secondary | ICD-10-CM | POA: Diagnosis present

## 2018-10-29 LAB — BASIC METABOLIC PANEL
Anion gap: 10 (ref 5–15)
BUN: 22 mg/dL (ref 8–23)
CO2: 27 mmol/L (ref 22–32)
Calcium: 9 mg/dL (ref 8.9–10.3)
Chloride: 96 mmol/L — ABNORMAL LOW (ref 98–111)
Creatinine, Ser: 0.84 mg/dL (ref 0.44–1.00)
GFR calc Af Amer: >60 mL/min (ref >60)
GFR calc non Af Amer: >60 mL/min (ref >60)
Glucose, Bld: 278 mg/dL — ABNORMAL HIGH (ref 70–99)
Potassium: 4.2 mmol/L (ref 3.5–5.1)
Sodium: 133 mmol/L — ABNORMAL LOW (ref 135–145)

## 2018-10-29 LAB — TROPONIN I (HIGH SENSITIVITY): Troponin I (High Sensitivity): 8 ng/L (ref <18)

## 2018-10-29 LAB — CBC
HCT: 34.4 % — ABNORMAL LOW (ref 36.0–46.0)
Hemoglobin: 10.6 g/dL — ABNORMAL LOW (ref 12.0–15.0)
MCH: 26.8 pg (ref 26.0–34.0)
MCHC: 30.8 g/dL (ref 30.0–36.0)
MCV: 87.1 fL (ref 80.0–100.0)
Platelets: 246 10*3/uL (ref 150–400)
RBC: 3.95 MIL/uL (ref 3.87–5.11)
RDW: 13.3 % (ref 11.5–15.5)
WBC: 8.5 10*3/uL (ref 4.0–10.5)
nRBC: 0 % (ref 0.0–0.2)

## 2018-10-29 MED ORDER — SODIUM CHLORIDE 0.9% FLUSH: 3.0000 mL | Freq: Once | INTRAVENOUS | Status: DC

## 2018-10-29 NOTE — ED Triage Notes (Signed)
The pt arrived by [facility] from home  She is c/o chest pain since this am with nausea.   No mi hx    She wears 02 at night   Ems gave 324 aspirin and sl nitro alert oriented skin warm and dry  No distress

## 2018-10-29 NOTE — ED Provider Notes (Signed)
[facility] EMERGENCY DEPARTMENT Provider Note   CSN: [id number] Arrival date & time: 10/29/18  2228    History   Chief Complaint Chief Complaint  Patient presents with  . Chest Pain    HPI TRISTIN GLADMAN is a 85 y.o. female.  HPI: A 85 year old patient with a history of treated diabetes and hypertension presents for evaluation of chest pain. Initial onset of pain was approximately 3-6 hours ago. The patient's chest pain is sharp, is not worse with exertion and is relieved by nitroglycerin. The patient complains of nausea. The patient's chest pain is middle- or left-sided, is not well-localized, is not described as heaviness/pressure/tightness and does radiate to the arms/jaw/neck. The patient denies diaphoresis. The patient has no history of stroke, has no history of peripheral artery disease, has not smoked in the past 90 days, has no relevant family history of coronary artery disease (first degree relative at less than age 74), has no history of hypercholesterolemia and does not have an elevated BMI (>=30).   The history is provided by the patient.  Chest Pain Pain location:  L chest Pain quality: sharp   Pain radiates to:  L shoulder Pain severity:  Severe Onset quality:  Sudden Timing:  Intermittent Progression:  Improving Chronicity:  New Relieved by:  Nitroglycerin Worsened by:  Nothing Associated symptoms: fatigue, nausea and shortness of breath   Associated symptoms: no fever   Patient presents with chest pain.  She reports it started the morning and seemed to improve, but got worse tonight.  She has taken aspirin.  She also took nitroglycerin with some relief.  She reports nausea. She uses home oxygen.  Past Medical History:  Diagnosis Date  . Anginal pain ([place])   . Arthritis   . Asthma   . CHF (congestive heart failure) ([place])   . COPD (chronic obstructive pulmonary disease) ([place])   . Coronary artery disease   . Depression   . GERD  (gastroesophageal reflux disease)   . Headache   . History of hiatal hernia   . Hyperlipidemia   . Hypertension   . Insomnia   . Osteoporosis   . Pneumonia 2013  . PONV (postoperative nausea and vomiting)   . Shortness of breath dyspnea    with exertion    Patient Active Problem List   Diagnosis Date Noted  . Positive D-dimer 09/08/2018  . DOE (dyspnea on exertion) 09/07/2018  . Morbid obesity ([place]) 09/07/2018  . Dysphagia 08/12/2018  . Edema 08/12/2018  . Iron deficiency anemia 08/12/2018  . Senile purpura ([place]) 08/12/2018  . Strain of right shoulder 08/12/2018  . Chronic pain of both knees 05/14/2018  . Stasis edema of left lower extremity 04/13/2018  . Vitamin B12 deficiency 04/13/2018  . Restless legs syndrome 03/04/2018  . At high risk for falls 11/11/2017  . Gastroesophageal reflux disease without esophagitis 11/11/2017  . CKD (chronic kidney disease) stage 3, GFR 30-59 ml/min (HCC) 05/07/2017  . Chronic respiratory failure with hypoxia ([place]) 05/05/2017  . Midsternal chest pain 11/11/2015  . Chronic diastolic congestive heart failure ([place]) 11/11/2015  . Chest pain 11/09/2015  . Depression 11/09/2015  . Panlobular emphysema ([place]) 11/09/2015  . Elevated blood sugar 11/09/2015  . Chronic insomnia 10/24/2015  . Type 2 diabetes mellitus with stage 3 chronic kidney disease ([place]) 08/14/2015  . Mesenteric mass 02/19/2015  . Coronary artery disease involving native coronary artery of native heart without angina pectoris 11/14/2014  . Mixed hyperlipidemia 11/14/2014  Past Surgical History:  Procedure Laterality Date  . ABDOMINAL HYSTERECTOMY    . APPENDECTOMY    . BREAST SURGERY  40 yrs. ago   growth removed in each breast-benign  . CARDIAC CATHETERIZATION     12/14/12 LHC ([facility]): few mild stenotic areas max 40% of ectatic RCA o/w NL coronaries, EF 65%. Med tx.  Marland Kitchen CATARACT EXTRACTION W/ INTRAOCULAR LENS  IMPLANT, BILATERAL Bilateral 15 yrs ago  . CHOLECYSTECTOMY    .  FOOT SURGERY    . LAPAROSCOPIC APPENDECTOMY N/A 02/19/2015   Procedure: EXCISION OF MESENTERIC MASS;  Surgeon: Stark Klein, MD;  Location: [facility];  Service: General;  Laterality: N/A;  . REPLACEMENT TOTAL KNEE BILATERAL       OB History   No obstetric history on file.      Home Medications    Prior to Admission medications   Medication Sig Start Date End Date Taking? Authorizing Provider  albuterol (PROVENTIL HFA;VENTOLIN HFA) 108 (90 BASE) MCG/ACT inhaler Inhale 2 puffs into the lungs every 6 (six) hours as needed.    [provider]  aspirin 81 MG tablet Take 81 mg by mouth daily.    [provider]  budesonide-formoterol (SYMBICORT) 160-4.5 MCG/ACT inhaler Inhale 2 puffs into the lungs 2 (two) times daily.    [provider]  citalopram (CELEXA) 20 MG tablet Take 20 mg by mouth daily. 10/12/16   [provider]  clonazePAM (KLONOPIN) 1 MG tablet Take 1 mg by mouth as needed (sleep).  11/20/14   [provider]  diclofenac sodium (VOLTAREN) 1 % GEL APPLY 2 GRAMS  TOPICALLY THREE TIMES DAILY 09/27/18   Newt Minion, MD  glimepiride (AMARYL) 1 MG tablet Take 1 mg by mouth daily. 08/05/18   [provider]  metolazone (ZAROXOLYN) 5 MG tablet Take 1 tablet (5 mg total) by mouth daily. 09/07/18 12/06/18  Revankar, Reita Cliche, MD  metoprolol succinate (TOPROL-XL) 25 MG 24 hr tablet Take 25 mg by mouth daily. 11/20/14   [provider]  pantoprazole (PROTONIX) 40 MG tablet Take 40 mg by mouth 2 (two) times daily. 11/14/16   [provider]  potassium chloride (MICRO-K) 10 MEQ CR capsule Take 10 mEq by mouth daily. 11/20/14   [provider]  rOPINIRole (REQUIP) 0.25 MG tablet Take 0.25 mg by mouth at bedtime.    [provider]  rosuvastatin (CRESTOR) 40 MG tablet Take 40 mg by mouth daily.    [provider]  Vitamin D, Ergocalciferol, (DRISDOL) 50000 UNITS CAPS capsule Take 50,000 Units by mouth every  Sunday. No specific days    [provider]    Family History Family History  Family history unknown: Yes    Social History Social History   Tobacco Use  . Smoking status: Former Smoker    Packs/day: 0.25    Types: Cigarettes    Quit date: 08/21/2016    Years since quitting: 2.1  . Smokeless tobacco: Never Used  Substance Use Topics  . Alcohol use: No  . Drug use: No     Allergies   Codeine, Levofloxacin, Penicillins, and Tape   Review of Systems Review of Systems  Constitutional: Positive for fatigue. Negative for fever.  Respiratory: Positive for shortness of breath.   Cardiovascular: Positive for chest pain.  Gastrointestinal: Positive for nausea.  All other systems reviewed and are negative.    Physical Exam Updated Vital Signs BP (!) 157/55 (BP Location: Right Arm)   Pulse 61  Temp 97.7 F (36.5 C) (Oral)   Resp (!) 21   Ht 1.613 m (5' 3.5")   Wt 120.7 kg   SpO2 100%   BMI 46.38 kg/m   Physical Exam CONSTITUTIONAL: Elderly, no acute distress HEAD: Normocephalic/atraumatic EYES: EOMI ENMT: Mucous membranes moist, mask in place NECK: supple no meningeal signs SPINE/BACK:entire spine nontender CV: S1/S2 noted, no murmurs/rubs/gallops noted LUNGS: Lungs are clear to auscultation bilaterally, no apparent distress, patient wearing oxygen ABDOMEN: soft, nontender, no rebound or guarding, bowel sounds noted throughout abdomen GU:no cva tenderness NEURO: Pt is awake/alert/appropriate, moves all extremitiesx4.  No facial droop.   EXTREMITIES: pulses normal/equal, full ROM, chronic edema lower extremities, distal pulses intact No warmth erythema over left shoulder.  No focal tenderness to chest or left shoulder SKIN: warm, color normal PSYCH: no abnormalities of mood noted, alert and oriented to situation   ED Treatments / Results  Labs (all labs ordered are listed, but only abnormal results are displayed) Labs Reviewed  BASIC METABOLIC  PANEL - Abnormal; Notable for the following components:      Result Value   Sodium 133 (*)    Chloride 96 (*)    Glucose, Bld 278 (*)    All other components within normal limits  CBC - Abnormal; Notable for the following components:   Hemoglobin 10.6 (*)    HCT 34.4 (*)    All other components within normal limits  SARS CORONAVIRUS 2  TROPONIN I (HIGH SENSITIVITY)  TROPONIN I (HIGH SENSITIVITY)    EKG EKG Interpretation  Date/Time:  Friday October 29 2018 22:29:59 EDT Ventricular Rate:  64 PR Interval:    QRS Duration: 103 QT Interval:  451 QTC Calculation: 466 R Axis:   34 Text Interpretation:  Sinus rhythm Abnormal R-wave progression, early transition Interpretation limited secondary to artifact Confirmed by Ripley Fraise [phone]) on 10/29/2018 11:11:20 PM   Radiology Dg Chest Portable 1 View  Result Date: 10/29/2018 CLINICAL DATA:  Chest pain EXAM: PORTABLE CHEST 1 VIEW COMPARISON:  September 07, 2018 FINDINGS: The heart remains enlarged. Aortic calcifications are noted. There is no pneumothorax or large pleural effusion. Atelectasis or scarring is noted at the lung bases. There are old healed right-sided rib fractures. There is no acute osseous abnormality. IMPRESSION: No active disease. Electronically Signed   By: Constance Holster M.D.   On: 10/29/2018 23:02    Procedures Procedures  Medications Ordered in ED Medications  sodium chloride flush (NS) 0.9 % injection 3 mL (has no administration in time range)     Initial Impression / Assessment and Plan / ED Course  I have reviewed the triage vital signs and the nursing notes.  Pertinent labs & imaging results that were available during my care of the patient were reviewed by me and considered in my medical decision making (see chart for details).     HEAR Score: 5  11:29 PM Patient with chest pain intermittently today.  Patient appears breathless still appears uncomfortable She has had extensive evaluation  recently.  CT angios chest last month was negative for PE or aneurysm. CT coronary this month was negative for significant stenosis Work-up is pending at this time. 12:58 AM Patient reports pain is improved.  Initial troponin negative. CT coronary was negative, but did have an elevated calcium score which can limit results.  Due to the severity of her chest pain, I feel she should be admitted.  Low suspicion for PE/dissection. Labs otherwise unremarkable except for hyperglycemia.  Discussed with  Dr. Jonelle Sidle for admission Final Clinical Impressions(s) / ED Diagnoses   Final diagnoses:  Unstable angina [facility])    ED Discharge Orders    None       Ripley Fraise, MD 10/30/18 [phone]

## 2018-10-30 ENCOUNTER — Other Ambulatory Visit: Payer: Self-pay

## 2018-10-30 DIAGNOSIS — E782 Mixed hyperlipidemia: Secondary | ICD-10-CM

## 2018-10-30 DIAGNOSIS — K219 Gastro-esophageal reflux disease without esophagitis: Secondary | ICD-10-CM

## 2018-10-30 DIAGNOSIS — I2510 Atherosclerotic heart disease of native coronary artery without angina pectoris: Secondary | ICD-10-CM

## 2018-10-30 DIAGNOSIS — N183 Chronic kidney disease, stage 3 (moderate): Secondary | ICD-10-CM

## 2018-10-30 DIAGNOSIS — I200 Unstable angina: Secondary | ICD-10-CM | POA: Diagnosis present

## 2018-10-30 DIAGNOSIS — F329 Major depressive disorder, single episode, unspecified: Secondary | ICD-10-CM

## 2018-10-30 DIAGNOSIS — I5032 Chronic diastolic (congestive) heart failure: Secondary | ICD-10-CM

## 2018-10-30 DIAGNOSIS — R079 Chest pain, unspecified: Secondary | ICD-10-CM

## 2018-10-30 HISTORY — DX: Unstable angina: I20.0

## 2018-10-30 LAB — CBC WITH DIFFERENTIAL/PLATELET
Abs Immature Granulocytes: 0.01 10*3/uL (ref 0.00–0.07)
Basophils Absolute: 0.1 10*3/uL (ref 0.0–0.1)
Basophils Relative: 1 %
Eosinophils Absolute: 0.2 10*3/uL (ref 0.0–0.5)
Eosinophils Relative: 3 %
HCT: 32.6 % — ABNORMAL LOW (ref 36.0–46.0)
Hemoglobin: 10.1 g/dL — ABNORMAL LOW (ref 12.0–15.0)
Immature Granulocytes: 0 %
Lymphocytes Relative: 37 %
Lymphs Abs: 2.8 10*3/uL (ref 0.7–4.0)
MCH: 26.8 pg (ref 26.0–34.0)
MCHC: 31 g/dL (ref 30.0–36.0)
MCV: 86.5 fL (ref 80.0–100.0)
Monocytes Absolute: 0.6 10*3/uL (ref 0.1–1.0)
Monocytes Relative: 7 %
Neutro Abs: 3.8 10*3/uL (ref 1.7–7.7)
Neutrophils Relative %: 52 %
Platelets: 210 10*3/uL (ref 150–400)
RBC: 3.77 MIL/uL — ABNORMAL LOW (ref 3.87–5.11)
RDW: 13.4 % (ref 11.5–15.5)
WBC: 7.5 10*3/uL (ref 4.0–10.5)
nRBC: 0 % (ref 0.0–0.2)

## 2018-10-30 LAB — COMPREHENSIVE METABOLIC PANEL
ALT: 14 U/L (ref 0–44)
AST: 19 U/L (ref 15–41)
Albumin: 3 g/dL — ABNORMAL LOW (ref 3.5–5.0)
Alkaline Phosphatase: 70 U/L (ref 38–126)
Anion gap: 4 — ABNORMAL LOW (ref 5–15)
BUN: 22 mg/dL (ref 8–23)
CO2: 26 mmol/L (ref 22–32)
Calcium: 8.4 mg/dL — ABNORMAL LOW (ref 8.9–10.3)
Chloride: 101 mmol/L (ref 98–111)
Creatinine, Ser: 0.83 mg/dL (ref 0.44–1.00)
GFR calc Af Amer: >60 mL/min (ref >60)
GFR calc non Af Amer: >60 mL/min (ref >60)
Glucose, Bld: 298 mg/dL — ABNORMAL HIGH (ref 70–99)
Potassium: 4 mmol/L (ref 3.5–5.1)
Sodium: 131 mmol/L — ABNORMAL LOW (ref 135–145)
Total Bilirubin: 0.7 mg/dL (ref 0.3–1.2)
Total Protein: 5.7 g/dL — ABNORMAL LOW (ref 6.5–8.1)

## 2018-10-30 LAB — TROPONIN I (HIGH SENSITIVITY)
Troponin I (High Sensitivity): 7 ng/L (ref <18)
Troponin I (High Sensitivity): 7 ng/L (ref <18)
Troponin I (High Sensitivity): 6 ng/L (ref <18)

## 2018-10-30 LAB — MAGNESIUM: Magnesium: 1.7 mg/dL (ref 1.7–2.4)

## 2018-10-30 LAB — PHOSPHORUS: Phosphorus: 2.9 mg/dL (ref 2.5–4.6)

## 2018-10-30 LAB — SARS CORONAVIRUS 2 (TAT 6-24 HRS): SARS Coronavirus 2: NEGATIVE

## 2018-10-30 MED ORDER — ASPIRIN 81 MG PO CHEW
81.0000 mg | CHEWABLE_TABLET | Freq: Every day | ORAL | Status: DC
  Filled 2018-10-30: qty 1

## 2018-10-30 MED ORDER — ROPINIROLE HCL 0.5 MG PO TABS: 0.2500 mg | ORAL_TABLET | Freq: Every day | ORAL | Status: DC

## 2018-10-30 MED ORDER — ROSUVASTATIN CALCIUM 20 MG PO TABS
40.0000 mg | ORAL_TABLET | Freq: Every day | ORAL | Status: DC
  Filled 2018-10-30: qty 2

## 2018-10-30 MED ORDER — NITROGLYCERIN 0.4 MG SL SUBL: 0.4000 mg | SUBLINGUAL_TABLET | SUBLINGUAL | 3 refills | Status: DC | PRN

## 2018-10-30 MED ORDER — ENOXAPARIN SODIUM 120 MG/0.8ML ~~LOC~~ SOLN
120.0000 mg | Freq: Two times a day (BID) | SUBCUTANEOUS | Status: DC
  Administered 2018-10-30: 02:00:00 120 mg via SUBCUTANEOUS
  Filled 2018-10-30 (×3): qty 0.8

## 2018-10-30 MED ORDER — ACETAMINOPHEN 325 MG PO TABS
650.0000 mg | ORAL_TABLET | ORAL | Status: DC | PRN
  Administered 2018-10-30: 15:00:00 650 mg via ORAL
  Filled 2018-10-30: qty 2

## 2018-10-30 MED ORDER — DICLOFENAC SODIUM 1 % TD GEL
2.0000 g | Freq: Four times a day (QID) | TRANSDERMAL | Status: DC
  Filled 2018-10-30: qty 100

## 2018-10-30 MED ORDER — POTASSIUM CHLORIDE CRYS ER 10 MEQ PO TBCR
10.0000 meq | EXTENDED_RELEASE_TABLET | Freq: Every day | ORAL | Status: DC
  Filled 2018-10-30: qty 1

## 2018-10-30 MED ORDER — ONDANSETRON HCL 4 MG/2ML IJ SOLN: 4.0000 mg | Freq: Four times a day (QID) | INTRAMUSCULAR | Status: DC | PRN

## 2018-10-30 MED ORDER — METOPROLOL SUCCINATE ER 25 MG PO TB24
25.0000 mg | ORAL_TABLET | Freq: Every day | ORAL | Status: DC
  Filled 2018-10-30: qty 1

## 2018-10-30 MED ORDER — SODIUM CHLORIDE 0.9 % IV SOLN
INTRAVENOUS | Status: DC
  Administered 2018-10-30: 03:00:00 via INTRAVENOUS

## 2018-10-30 MED ORDER — CITALOPRAM HYDROBROMIDE 20 MG PO TABS
20.0000 mg | ORAL_TABLET | Freq: Every day | ORAL | Status: DC
  Filled 2018-10-30: qty 1

## 2018-10-30 MED ORDER — VITAMIN D (ERGOCALCIFEROL) 1.25 MG (50000 UNIT) PO CAPS: 50000.0000 [IU] | ORAL_CAPSULE | ORAL | Status: DC

## 2018-10-30 MED ORDER — CLONAZEPAM 0.5 MG PO TABS: 1.0000 mg | ORAL_TABLET | ORAL | Status: DC | PRN

## 2018-10-30 MED ORDER — GLIMEPIRIDE 1 MG PO TABS
1.0000 mg | ORAL_TABLET | Freq: Every day | ORAL | Status: DC
  Filled 2018-10-30: qty 1

## 2018-10-30 MED ORDER — METOLAZONE 5 MG PO TABS
5.0000 mg | ORAL_TABLET | Freq: Every day | ORAL | Status: DC
  Filled 2018-10-30: qty 1

## 2018-10-30 MED ORDER — PANTOPRAZOLE SODIUM 40 MG PO TBEC
40.0000 mg | DELAYED_RELEASE_TABLET | Freq: Two times a day (BID) | ORAL | Status: DC
  Filled 2018-10-30: qty 1

## 2018-10-30 MED ORDER — MOMETASONE FURO-FORMOTEROL FUM 200-5 MCG/ACT IN AERO
2.0000 | INHALATION_SPRAY | Freq: Two times a day (BID) | RESPIRATORY_TRACT | Status: DC
  Administered 2018-10-30: 2 via RESPIRATORY_TRACT
  Filled 2018-10-30: qty 8.8

## 2018-10-30 MED ORDER — ALBUTEROL SULFATE (2.5 MG/3ML) 0.083% IN NEBU: 3.0000 mL | INHALATION_SOLUTION | Freq: Four times a day (QID) | RESPIRATORY_TRACT | Status: DC | PRN

## 2018-10-30 NOTE — ED Notes (Signed)
Pain "better"

## 2018-10-30 NOTE — ED Notes (Signed)
Report given  To rn on 6e

## 2018-10-30 NOTE — ED Notes (Signed)
Report called to rn on 6e 

## 2018-10-30 NOTE — Evaluation (Signed)
Occupational Therapy Evaluation Patient Details Name: Danielle [name] MRN: [id number] DOB: 07/19/1933 Today's Date: 10/30/2018    History of Present Illness Pt is n 85 yo female s/p chest pain now relieved. Pt PMHx: HTN.   Clinical Impression   Pt PTA supervisionA level for ADL and mobility. Spouse very supportive per pt and assists with all IADL. Pt currently limited by fair activity tolerance. Pt performing ADL tasks with supervisionA in sitting and standing at [facility] and at sink. Pt tolerating session well. Swelling in BLEs noted. Pt HR 77 BPM with 98% O2 upon exertion 100' with RW. Pt reported no SOB or chest pain throughout. Pt does not require continued skilled acute OT. Pt would benefit from HHOT/HHPT upon d/c home for energy conservation and weakness. OT signing off.    Follow Up Recommendations  Home health OT;Supervision - Intermittent(andf HHPT)    Equipment Recommendations  None recommended by OT    Recommendations for Other Services       Precautions / Restrictions Precautions Precautions: Fall Restrictions Weight Bearing Restrictions: No      Mobility Bed Mobility Overal bed mobility: Modified Independent                Transfers Overall transfer level: Needs assistance Equipment used: Rolling walker (2 wheeled) Transfers: Sit to/from [facility] Sit to Stand: Supervision Stand pivot transfers: Supervision       General transfer comment: for safety    Balance Overall balance assessment: Needs assistance Sitting-balance support: Feet supported Sitting balance-Leahy Scale: Good     Standing balance support: Bilateral upper extremity supported;During functional activity Standing balance-Leahy Scale: Fair                             ADL either performed or assessed with clinical judgement   ADL Overall ADL's : At baseline                                       General ADL Comments: Uses BSC and  commode with high ridere at home, with supervision; ALl taks supervisionA level with RW for stability.     Vision Baseline Vision/History: Wears glasses Wears Glasses: Reading only Vision Assessment?: No apparent visual deficits     Perception     Praxis      Pertinent Vitals/Pain Pain Assessment: Faces Faces Pain Scale: Hurts a little bit Pain Location: BLEs below kneee Pain Descriptors / Indicators: Discomfort Pain Intervention(s): Limited activity within patient's tolerance     Hand Dominance Right   Extremity/Trunk Assessment Upper Extremity Assessment Upper Extremity Assessment: Overall WFL for tasks assessed   Lower Extremity Assessment Lower Extremity Assessment: Generalized weakness   Cervical / Trunk Assessment Cervical / Trunk Assessment: Other exceptions Cervical / Trunk Exceptions: large body habitus   Communication Communication Communication: No difficulties   Cognition Arousal/Alertness: Awake/alert Behavior During Therapy: WFL for tasks assessed/performed Overall Cognitive Status: Within Functional Limits for tasks assessed                                     General Comments  Pt 98% on RA with exertion of 100' x2 times. Pt tolerating session well with no SOB noted. HR to 77 BPM.    Exercises     Shoulder Instructions  Home Living Family/patient expects to be discharged to:: Private residence Living Arrangements: Spouse/significant other Available Help at Discharge: Family;Available 24 hours/day Type of Home: House Home Access: Stairs to enter [employer] of Steps: 2 Entrance Stairs-Rails: Left Home Layout: One level     Bathroom Shower/Tub: Occupational psychologist: Handicapped height     Home Equipment: Shower seat;Grab bars - toilet;Grab bars - tub/shower;Toilet riser;Cane - single point;Walker - 2 wheels;Bedside commode          Prior Functioning/Environment Level of Independence: Needs  assistance  Gait / Transfers Assistance Needed: RW at home; 2 one on patio and 1 in side ADL's / Homemaking Assistance Needed: required assist with ADL    Comments: falls x3 times this year        OT Problem List: Decreased strength;Decreased activity tolerance;Impaired balance (sitting and/or standing);Decreased coordination;Decreased safety awareness;Pain      OT Treatment/Interventions:      OT Goals(Current goals can be found in the care plan section)    OT Frequency:     Barriers to D/C:            Co-evaluation              AM-PAC OT "6 Clicks" Daily Activity     Outcome Measure Help from another person eating meals?: None Help from another person taking care of personal grooming?: None Help from another person toileting, which includes using toliet, bedpan, or urinal?: A Little Help from another person bathing (including washing, rinsing, drying)?: A Little Help from another person to put on and taking off regular upper body clothing?: None Help from another person to put on and taking off regular lower body clothing?: A Little 6 Click Score: 21   End of Session Equipment Utilized During Treatment: Gait belt;Rolling walker Nurse Communication: Mobility status  Activity Tolerance: Patient tolerated treatment well Patient left: in bed;with call bell/phone within reach  OT Visit Diagnosis: Unsteadiness on feet (R26.81);Muscle weakness (generalized) (M62.81);Repeated falls (R29.6)                Time: 1345-1415 OT Time Calculation (min): 30 min Charges:  OT General Charges $OT Visit: 1 Visit OT Evaluation $OT Eval Moderate Complexity: 1 Mod OT Treatments $Self Care/Home Management : 8-22 mins  Ebony Hail Harold Hedge) Marsa Aris OTR/L Acute Rehabilitation Services Pager: [phone] Office: [place] 10/30/2018, 2:16 PM

## 2018-10-30 NOTE — Discharge Summary (Signed)
Physician Discharge Summary  Danielle [name] VHQ:[id number] DOB: January 03, 1934 DOA: 10/29/2018  PCP: [name], MD  Admit date: 10/29/2018 Discharge date: 10/30/2018  Admitted From: Home Disposition: Home with home health PT and OT  Recommendations for Outpatient Follow-up:  1. Follow up with PCP in 1-2 weeks 2. Follow up with Cardiology Dr. Geraldo Pitter within 1-2 weeks 3. Please obtain CMP/CBC, Mag, Phos in one week 4. Please follow up on the following pending results:  Home Health: Yes Equipment/Devices: None  Discharge Condition: Stable CODE STATUS: FULL CODE Diet recommendation: Heart Healthy Carb Modified Diet  Brief/Interim Summary: HPI per Dr. Gala Romney on 10/30/2018 Danielle [name] is a 85 y.o. female with medical history significant of hypertension, diabetes, hyperlipidemia, diastolic CHF, COPD, coronary artery disease, GERD and insomnia who presented to the ER with substernal chest pain for 3 to 6 hours prior to arrival.  It is sharp rated as 7 out of 10 worsened with activities and relieved with rest.  Chest pain is gone now in the ER.  Patient has had prior MI and symptoms felt like similar symptoms.  No radiation.  No diaphoresis.  Initial enzymes and EKG appear to be stable and patient is being admitted mainly for rule out MI..  ED Course: Temperature is 97.7 blood pressure 157/55 pulse 66 respirate of 24 oxygen sat 95% on room air.  Hemoglobin is 10.6 otherwise CBC within normal sodium 133 potassium 4.2 chloride 96.  Creatinine is 0.84.  Chest x-ray showed no active findings.  COVID-19 screen is negative.  Patient is being admitted for rule out MI.  **Interim History Upon review of the patient's chart she had a CTA done which showed nonobstructive coronary artery disease as well as a recent echocardiogram with a normal EF.  Cardiac troponins were flat and EKG showed no acute changes.  I discussed the case with Dr. Bronson Ing of cardiology who recommends no  further inpatient cardiac work-up and recommended discharge home with close follow-up with her primary cardiologist Dr. Geraldo Pitter.  Patient was given a prescription for sublingual nitroglycerin and she will need to follow-up with PCP as well as cardiology within 1 week.  Prior to discharge she had a PT OT evaluation which recommended home health.  She did not desaturate on her home O2 screen and was stable for discharge at this time.  Discharge Diagnoses:  Principal Problem:   Unstable angina (HCC) Active Problems:   Depression   Chronic diastolic congestive heart failure (HCC)   Coronary artery disease involving native coronary artery of native heart without angina pectoris   Mixed hyperlipidemia   CKD (chronic kidney disease) stage 3, GFR 30-59 ml/min (HCC)   Morbid obesity (HCC)   Gastroesophageal reflux disease without esophagitis  Chest Pain r/o ACS -Patient has history of coronary artery disease which is remote.  -Has been extensively worked up with a CTA 3 weeks ago and as well as a outpatient echo  -Also ongoing significant risk factors as described above.   -Admitted for observation and chest pain had improved and no recurrence -Check serial enzymes and they were all flat -C/w aspirin, beta-blocker as well as statin -Discussed the case with Dr.) of cardiology who recommends no further inpatient cardiac work-up and recommends discharge home as she recently had a CTA with nonobstructive coronary disease as well as an echo with a normal EF -We have written her prescription for sublingual nitroglycerin she will need to follow-up with Dr. Geraldo Pitter and she understands and agrees with the  plan of care  Chronic kidney disease stage III -BUN/creatinine looks normal at the moment.  We will continue close monitoring in the outpatient setting   Hyperlipidemia -Recent lipid panel done showed a total cholesterol/HDL ratio of 3.4, cholesterol level of 134, HDL level of 40, LDL 67,  triglycerides 135, VLDL 27 -Continue Rosuvastatin 40 minutes daily  Diabetes mellitus type 2 -Patient blood sugar slightly elevated Continue glimepiride 1 mg p.o. daily -We will need further blood sugar management and titration per primary care -Had been placed on a sliding scale insulin while she was hospitalized  Chronic Diastolic CHF -Review of recent echocardiogram on 10/22/2018 showed the left ventricle has a normal systolic function with an EF of 60 to 65% with impaired relaxation along with recent coronary CT showed nonobstructive CAD -Currently slightly dry so was given IV fluids and will stop -Continue monitor for volume overload -Resume home heart failure medications including metoprolol succinate 25 mg p.o. daily, metolazone 5 mg p.o. daily as well as furosemide 40 g p.o. daily along with 10 mEq of potassium chloride -We will need close follow-up with cardiology in outpatient setting  GERD -Continue with Pantoprazole 40 mg p.o. twice daily  COPD -Currently not in exacerbation -Continue home inhalers at discharge  Depression and Anxiety -Continue with Citalopram 20 g p.o. daily as well as clonazepam 1 mg p.o. nightly as needed sleep  Normocytic Anemia -Patient's hemoglobin/hematocrit went from 10.6/34.4 and is now down to 10.1/32.6 -Likely in the setting of dilutional drop and CKD stage III -Check anemia panel in outpatient setting Continue monitor for signs and symptoms of bleeding; currently no overt bleeding noted -Repeat CBC in the outpatient setting  Morbid Obesity -Estimated body mass index is 46.24 kg/m as calculated from the following:   Height as of this encounter: 5' 3.5" (1.613 m).   Weight as of this encounter: 120.3 kg. -Weight Loss and Dietary Counseling given   Discharge Instructions  Allergies as of 10/30/2018      Reactions   Codeine Swelling   Levofloxacin    Other reaction(s): Malaise (intolerance)   Penicillins Swelling   Has patient had a  PCN reaction causing immediate rash, facial/tongue/throat swelling, SOB or lightheadedness with hypotension:YES Has patient had a PCN reaction causing severe rash involving mucus membranes or skin necrosis: NO Has patient had a PCN reaction that required hospitalization NO Has patient had a PCN reaction occurring within the last 10 years: NO If all of the above answers are "NO", then may proceed with Cephalosporin use.   Tape Rash      Medication List    STOP taking these medications   metolazone 5 MG tablet Commonly known as: ZAROXOLYN     TAKE these medications   albuterol 108 (90 Base) MCG/ACT inhaler Commonly known as: VENTOLIN HFA Inhale 2 puffs into the lungs every 6 (six) hours as needed for wheezing or shortness of breath.   aspirin 81 MG tablet Take 81 mg by mouth daily.   citalopram 20 MG tablet Commonly known as: CELEXA Take 20 mg by mouth daily.   clonazePAM 1 MG tablet Commonly known as: KLONOPIN Take 1 mg by mouth as needed (sleep).   diclofenac sodium 1 % Gel Commonly known as: VOLTAREN APPLY 2 GRAMS  TOPICALLY THREE TIMES DAILY What changed: See the new instructions.   furosemide 20 MG tablet Commonly known as: LASIX Take 40 mg by mouth daily.   glimepiride 1 MG tablet Commonly known as: AMARYL Take 1 mg by  mouth daily.   metoprolol succinate 25 MG 24 hr tablet Commonly known as: TOPROL-XL Take 25 mg by mouth daily.   nitroGLYCERIN 0.4 MG SL tablet Commonly known as: Nitrostat Place 1 tablet (0.4 mg total) under the tongue every 5 (five) minutes as needed for chest pain.   pantoprazole 40 MG tablet Commonly known as: PROTONIX Take 40 mg by mouth 2 (two) times daily.   potassium chloride 10 MEQ CR capsule Commonly known as: MICRO-K Take 10 mEq by mouth daily.   rOPINIRole 0.25 MG tablet Commonly known as: REQUIP Take 0.25 mg by mouth at bedtime.   rosuvastatin 40 MG tablet Commonly known as: CRESTOR Take 40 mg by mouth daily.    Symbicort 160-4.5 MCG/ACT inhaler Generic drug: budesonide-formoterol Inhale 2 puffs into the lungs 2 (two) times daily.   Vitamin D (Ergocalciferol) 1.25 MG (50000 UT) Caps capsule Commonly known as: DRISDOL Take 50,000 Units by mouth every Sunday. No specific days       Allergies  Allergen Reactions  . Codeine Swelling  . Levofloxacin     Other reaction(s): Malaise (intolerance)  . Penicillins Swelling    Has patient had a PCN reaction causing immediate rash, facial/tongue/throat swelling, SOB or lightheadedness with hypotension:YES Has patient had a PCN reaction causing severe rash involving mucus membranes or skin necrosis: NO Has patient had a PCN reaction that required hospitalization NO Has patient had a PCN reaction occurring within the last 10 years: NO If all of the above answers are "NO", then may proceed with Cephalosporin use.  . Tape Rash   Consultations:  Discussed Case with Dr. Bronson Ing of Cardiology  Procedures/Studies: Ct Coronary Morph W/cta Cor W/score W/ca W/cm &/or Wo/cm  Addendum Date: 10/12/2018   ADDENDUM REPORT: 10/12/2018 23:02 CLINICAL DATA:  85 year old female with chest pain. EXAM: Cardiac/Coronary  CT TECHNIQUE: The patient was scanned on a [employer]. FINDINGS: A 120 kV prospective scan was triggered in the descending thoracic aorta at 111 HU's. Axial non-contrast 3 mm slices were carried out through the heart. The data set was analyzed on a dedicated work station and scored using the [place]. Gantry rotation speed was 250 msecs and collimation was .6 mm. No beta blockade and 0.8 mg of sl NTG was given. The 3D data set was reconstructed in 5% intervals of the 67-82 % of the R-R cycle. Diastolic phases were analyzed on a dedicated work station using MPR, MIP and VRT modes. The patient received 80 cc of contrast. Aorta: Normal size. Mild diffuse atherosclerotic plaque and calcifications. No dissection. Aortic Valve:  Trileaflet.  No  calcifications. Coronary Arteries:  Normal coronary origin.  Right dominance. RCA is a very large dominant artery that gives rise to PDA and PLA. There is mild diffuse calcified plaque throughout the entire RCA with stenosis 25-49%. Left main is a large artery that gives rise to LAD and LCX arteries. Left main has no plaque. LAD is a large vessel that gives rise to two small diagonal arteries. Proximal and mid LAD has a mild calcified plaque with stenosis 25-49%. LCX is a non-dominant artery that gives rise to one OM1 branch. There is moderate mixed plaque in the mid LCX artery with stenosis 50-69%. Other findings: Normal pulmonary vein drainage into the left atrium. Normal let atrial appendage without a thrombus. Normal size of the pulmonary artery. IMPRESSION: 1. Coronary calcium score of 1190. This was 95 percentile for age and sex matched control. 2. Normal coronary origin with right dominance.  3. Mild diffuse CAD in the entire RCA, proximal and mid LAD and moderate CAD in the mid LCX artery. Additional analysis with CT FFR will be submitted. Electronically Signed   By: Ena Dawley   On: 10/12/2018 23:02   Result Date: 10/12/2018 EXAM: OVER-READ INTERPRETATION CT CHEST The following report is an over-read performed by radiologist Dr. Evangeline Dakin of [facility], [place] on 10/11/2018. This over-read does not include interpretation of cardiac or coronary anatomy or pathology. The coronary calcium score/coronary CTA interpretation by the cardiologist is attached. COMPARISON:  CTA chest for pulmonary embolism 09/08/2018. FINDINGS: Vascular: Mild to moderate atherosclerosis involving the descending thoracic aorta. No evidence of aortic aneurysm. Mediastinum/Nodes: No pathologic lymphadenopathy within the visualized mediastinum. Visualized esophagus normal in appearance. Lungs/Pleura: Likely benign 4 mm subpleural nodule in the anterolateral RIGHT UPPER LOBE (series 13, image 14), identified on the CT 1  month ago and unchanged in that short interval. Visualized lung parenchyma otherwise clear. No pleural effusions. Central airways patent without significant bronchial wall thickening. Upper Abdomen: Unremarkable for the early arterial phase of enhancement. Musculoskeletal: Degenerative changes and DISH involving the visualized LOWER thoracic spine. IMPRESSION: 1. Likely benign 4 mm subpleural nodule in the anterolateral RIGHT UPPER LOBE, identified on the CT 1 month ago and unchanged in that short interval. Follow-up recommendation was present on that report and is repeated here: No follow-up needed if patient is low-risk. Non-contrast chest CT can be considered in 12 months if patient is high-risk. This recommendation follows the consensus statement: Guidelines for Management of Incidental Pulmonary Nodules Detected on CT Images: From the Fleischner Society 2017; Radiology 2017; 284:228-243. 2.  Aortic Atherosclerosis (ICD10-170.0) 3. No significant extracardiac findings otherwise. Electronically Signed: By: Evangeline Dakin M.D. On: 10/11/2018 15:34   Dg Chest Portable 1 View  Result Date: 10/29/2018 CLINICAL DATA:  Chest pain EXAM: PORTABLE CHEST 1 VIEW COMPARISON:  September 07, 2018 FINDINGS: The heart remains enlarged. Aortic calcifications are noted. There is no pneumothorax or large pleural effusion. Atelectasis or scarring is noted at the lung bases. There are old healed right-sided rib fractures. There is no acute osseous abnormality. IMPRESSION: No active disease. Electronically Signed   By: Constance Holster M.D.   On: 10/29/2018 23:02   Ct Coronary Fractional Flow Reserve Data Prep  Result Date: 10/12/2018 EXAM: CT FFR ANALYSIS CLINICAL DATA:  85 year old female with chest pain. FINDINGS: FFRct analysis was performed on the original cardiac CT angiogram dataset. Diagrammatic representation of the FFRct analysis is provided in a separate PDF document in PACS. This dictation was created using the PDF  document and an interactive 3D model of the results. 3D model is not available in the EMR/PACS. Normal FFR range is >0.80. 1. Left Main:  No significant stenosis. 2. LAD: No significant stenosis. 3. LCX: No significant stenosis. 4. RCA: No significant stenosis. IMPRESSION: 1.  CT FFR analysis didn't show any significant stenosis. Electronically Signed   By: Ena Dawley   On: 10/12/2018 23:03   ECHOCARDIOGRAM 10/22/2018 IMPRESSIONS    1. The left ventricle has normal systolic function with an ejection fraction of 60-65%. The cavity size was normal. Left ventricular diastolic Doppler parameters are consistent with impaired relaxation.  2. The right ventricle has normal systolic function. The cavity was normal. There is no increase in right ventricular wall thickness.  3. The aorta is normal in size and structure.  FINDINGS  Left Ventricle: The left ventricle has normal systolic function, with an ejection fraction of 60-65%.  The cavity size was normal. There is no increase in left ventricular wall thickness. Left ventricular diastolic Doppler parameters are consistent with  impaired relaxation.  Right Ventricle: The right ventricle has normal systolic function. The cavity was normal. There is no increase in right ventricular wall thickness.  Left Atrium: Left atrial size was normal in size.  Right Atrium: Right atrial size was normal in size. Right atrial pressure is estimated at 3 mmHg.  Interatrial Septum: No atrial level shunt detected by color flow Doppler.  Pericardium: There is no evidence of pericardial effusion.  Mitral Valve: The mitral valve is normal in structure. Mitral valve regurgitation was not assessed by color flow Doppler.  Tricuspid Valve: The tricuspid valve is normal in structure. Tricuspid valve regurgitation was not visualized by color flow Doppler.  Aortic Valve: The aortic valve is normal in structure. Aortic valve regurgitation was not assessed by  color flow Doppler.  Pulmonic Valve: The pulmonic valve was normal in structure. Pulmonic valve regurgitation was not assessed by color flow Doppler.  Aorta: The aorta is normal in size and structure.  Venous: The inferior vena cava measures 1.90 cm, is normal in size with greater than 50% respiratory variability.    +--------------+--------++ LEFT VENTRICLE         +----------------+---------++ +--------------+--------++ Diastology                PLAX 2D                +----------------+---------++ +--------------+--------++ LV e' lateral:  7.29 cm/s LVIDd:        5.80 cm  +----------------+---------++ +--------------+--------++ LV E/e' lateral:11.2      LVIDs:        4.00 cm  +----------------+---------++ +--------------+--------++ LV e' medial:   5.33 cm/s LV PW:        1.10 cm  +----------------+---------++ +--------------+--------++ LV E/e' medial: 15.4      LV IVS:       1.10 cm  +----------------+---------++ +--------------+--------++ LVOT diam:    2.00 cm  +--------------+--------++ LV SV:        97 ml    +--------------+--------++ LV SV Index:  40.42    +--------------+--------++ LVOT Area:    3.14 cm +--------------+--------++                        +--------------+--------++  +---------------+----------++ RIGHT VENTRICLE           +---------------+----------++ RV S prime:    11.00 cm/s +---------------+----------++ TAPSE (M-mode):3.0 cm     +---------------+----------++  +---------------+-------++-----------++ LEFT ATRIUM           Index       +---------------+-------++-----------++ LA diam:       4.00 cm1.83 cm/m  +---------------+-------++-----------++ LA Vol (A2C):  62.9 ml28.82 ml/m +---------------+-------++-----------++ LA Vol (A4C):  81.1 ml37.15 ml/m +---------------+-------++-----------++ LA Biplane Vol:71.2 ml32.62  ml/m +---------------+-------++-----------++ +------------+---------++-----------++ RIGHT ATRIUM         Index       +------------+---------++-----------++ RA Area:    21.10 cm            +------------+---------++-----------++ RA Volume:  72.70 ml 33.31 ml/m +------------+---------++-----------++  +------------+-----------++ AORTIC VALVE            +------------+-----------++ LVOT Vmax:  112.00 cm/s +------------+-----------++ LVOT Vmean: 73.900 cm/s +------------+-----------++ LVOT VTI:   0.273 m     +------------+-----------++   +-------------+-------++ AORTA                +-------------+-------++  Ao Root diam:3.20 cm +-------------+-------++ Ao Asc diam: 3.30 cm +-------------+-------++  +--------------+----------++  +---------------+-----------++ MITRAL VALVE              TRICUSPID VALVE            +--------------+----------++  +---------------+-----------++ MV Area (PHT):3.53 cm    TR Peak grad:  11.6 mmHg   +--------------+----------++  +---------------+-----------++ MV PHT:       62.35 msec  TR Vmax:       170.00 cm/s +--------------+----------++  +---------------+-----------++ MV Decel Time:215 msec   +--------------+----------++  +--------------+-------+ +--------------+-----------++ SHUNTS                MV E velocity:82.00 cm/s  +--------------+-------+ +--------------+-----------++ Systemic VTI: 0.27 m  MV A velocity:109.00 cm/s +--------------+-------+ +--------------+-----------++ Systemic Diam:2.00 cm MV E/A ratio: 0.75        +--------------+-------+ +--------------+-----------++  +---------+-------+ IVC              +---------+-------+ IVC diam:1.90 cm +---------+-------+  Subjective: Seen and examined at bedside and chest pain resolved.  Denies any nausea or vomiting.  Ambulated without issues and felt well and back to baseline.   Understands that she will need to follow-up with Dr. Geraldo Pitter and she understands agrees with plan of care.   Discharge Exam: Vitals:   10/30/18 0253 10/30/18 0825  BP: (!) 118/53   Pulse:    Resp: (!) 23   Temp:    SpO2: 96% 97%   Vitals:   10/30/18 0200 10/30/18 0242 10/30/18 0253 10/30/18 0825  BP: (!) 126/57  (!) 118/53   Pulse: 64     Resp: (!) 23  (!) 23   Temp:      TempSrc:      SpO2: 97%  96% 97%  Weight:  120.3 kg    Height:  5' 3.5" (1.613 m)     General: Pt is alert, awake, not in acute distress Cardiovascular: RRR, S1/S2 +, no rubs, no gallops Respiratory: Diminished bilaterally, no wheezing, no rhonchi Abdominal: Soft, NT, distended secondary body habitus, bowel sounds + Extremities: Trace to 1+ lower extremity edema, no cyanosis  The results of significant diagnostics from this hospitalization (including imaging, microbiology, ancillary and laboratory) are listed below for reference.    Microbiology: Recent Results (from the past 240 hour(s))  SARS CORONAVIRUS 2 Nasal Swab Aptima Multi Swab     Status: None   Collection Time: 10/30/18  1:55 AM   Specimen: Aptima Multi Swab; Nasal Swab  Result Value Ref Range Status   SARS Coronavirus 2 NEGATIVE NEGATIVE Final    Comment: (NOTE) SARS-CoV-2 target nucleic acids are NOT DETECTED. The SARS-CoV-2 RNA is generally detectable in upper and lower respiratory specimens during the acute phase of infection. Negative results do not preclude SARS-CoV-2 infection, do not rule out co-infections with other pathogens, and should not be used as the sole basis for treatment or other patient management decisions. Negative results must be combined with clinical observations, patient history, and epidemiological information. The expected result is Negative. Fact Sheet for Patients: SugarRoll.be Fact Sheet for Healthcare Providers: https://www.woods-mathews.com/ This test is not yet  approved or cleared by the [place] FDA and  has been authorized for detection and/or diagnosis of SARS-CoV-2 by FDA under an Emergency Use Authorization (EUA). This EUA will remain  in effect (meaning this test can be used) for the duration of the COVID-19 declaration under Section 56 4(b)(1) of the Act, 21 U.S.C. section 360bbb-3(b)(1), unless the authorization is terminated or  revoked sooner. Performed at [facility], [place] [address], [place], [place] [address]     Labs: BNP (last 3 results) No results for input(s): BNP in the last 8760 hours. Basic Metabolic Panel: Recent Labs  Lab 10/29/18 2308 10/30/18 0843  NA 133* 131*  K 4.2 4.0  CL 96* 101  CO2 27 26  GLUCOSE 278* 298*  BUN 22 22  CREATININE 0.84 0.83  CALCIUM 9.0 8.4*  MG  --  1.7  PHOS  --  2.9   Liver Function Tests: Recent Labs  Lab 10/30/18 0843  AST 19  ALT 14  ALKPHOS 70  BILITOT 0.7  PROT 5.7*  ALBUMIN 3.0*   No results for input(s): LIPASE, AMYLASE in the last 168 hours. No results for input(s): AMMONIA in the last 168 hours. CBC: Recent Labs  Lab 10/29/18 2308 10/30/18 0843  WBC 8.5 7.5  NEUTROABS  --  3.8  HGB 10.6* 10.1*  HCT 34.4* 32.6*  MCV 87.1 86.5  PLT 246 210   Cardiac Enzymes: No results for input(s): CKTOTAL, CKMB, CKMBINDEX, TROPONINI in the last 168 hours. BNP: Invalid input(s): POCBNP CBG: No results for input(s): GLUCAP in the last 168 hours. D-Dimer No results for input(s): DDIMER in the last 72 hours. Hgb A1c No results for input(s): HGBA1C in the last 72 hours. Lipid Profile No results for input(s): CHOL, HDL, LDLCALC, TRIG, CHOLHDL, LDLDIRECT in the last 72 hours. Thyroid function studies No results for input(s): TSH, T4TOTAL, T3FREE, THYROIDAB in the last 72 hours.  Invalid input(s): FREET3 Anemia work up No results for input(s): VITAMINB12, FOLATE, FERRITIN, TIBC, IRON, RETICCTPCT in the last 72 hours. Urinalysis    Component Value Date/Time    COLORURINE AMBER (A) 03/12/2015 2006   APPEARANCEUR CLEAR 03/12/2015 2006   LABSPEC 1.031 (H) 03/12/2015 2006   PHURINE 5.0 03/12/2015 2006   GLUCOSEU NEGATIVE 03/12/2015 2006   HGBUR NEGATIVE 03/12/2015 2006   BILIRUBINUR SMALL (A) 03/12/2015 2006   KETONESUR NEGATIVE 03/12/2015 2006   PROTEINUR NEGATIVE 03/12/2015 2006   UROBILINOGEN 1.0 02/12/2015 1105   NITRITE NEGATIVE 03/12/2015 2006   LEUKOCYTESUR NEGATIVE 03/12/2015 2006   Sepsis Labs Invalid input(s): PROCALCITONIN,  WBC,  LACTICIDVEN Microbiology Recent Results (from the past 240 hour(s))  SARS CORONAVIRUS 2 Nasal Swab Aptima Multi Swab     Status: None   Collection Time: 10/30/18  1:55 AM   Specimen: Aptima Multi Swab; Nasal Swab  Result Value Ref Range Status   SARS Coronavirus 2 NEGATIVE NEGATIVE Final    Comment: (NOTE) SARS-CoV-2 target nucleic acids are NOT DETECTED. The SARS-CoV-2 RNA is generally detectable in upper and lower respiratory specimens during the acute phase of infection. Negative results do not preclude SARS-CoV-2 infection, do not rule out co-infections with other pathogens, and should not be used as the sole basis for treatment or other patient management decisions. Negative results must be combined with clinical observations, patient history, and epidemiological information. The expected result is Negative. Fact Sheet for Patients: SugarRoll.be Fact Sheet for Healthcare Providers: https://www.woods-mathews.com/ This test is not yet approved or cleared by the [place] FDA and  has been authorized for detection and/or diagnosis of SARS-CoV-2 by FDA under an Emergency Use Authorization (EUA). This EUA will remain  in effect (meaning this test can be used) for the duration of the COVID-19 declaration under Section 56 4(b)(1) of the Act, 21 U.S.C. section 360bbb-3(b)(1), unless the authorization is terminated or revoked sooner. Performed at [facility]  [facility], [address] [place], [place] [address]    Time coordinating discharge: 35 minutes  SIGNED:  Kerney Elbe, DO [facility] 10/30/2018, 11:39 AM Pager is on [place]  If 7PM-7AM, please contact night-coverage [email or web address] Password [email or web address]

## 2018-10-30 NOTE — ED Notes (Signed)
Family on the way here

## 2018-10-30 NOTE — Progress Notes (Signed)
Physical Therapy Note  Spoke with occupational therapy after initial evaluation. OT reports patient is functioning at a high level of independence and no physical therapy is indicated at this time. PT is signing-off. Please re-order if there is any significant change in status. Thank you for this referral.  Ellouise Newer, PT, DPT

## 2018-10-30 NOTE — TOC Transition Note (Signed)
Transition of Care [facility]) - CM/SW Discharge Note   Patient Details  Name: Danielle [name] MRN: [id number] Date of Birth: [birth year]  [facility]) CM/SW Contact:  Claudie Leach, RN Phone Number: 10/30/2018, 4:48 PM   Clinical Narrative:    Pt to d/c home with husband and daughter to assist.  Patient does not have current home health services per daughter.  Daughter agrees to [facility] PT/OT.  Referral accepted by Sharmon Revere. No DME needed.   Final next level of care: [place] Barriers to Discharge: No Barriers Identified   Patient Goals and CMS Choice Patient states their goals for this hospitalization and ongoing recovery are:: to get home CMS Medicare.gov Compare Post Acute Care list provided to:: Patient Represenative (must comment)(daughter) Choice offered to / list presented to : Adult Children   Discharge Plan and Services    HH Arranged: PT, OT [facility] Agency: [place] Date [facility] Agency Contacted: 10/30/18 Time [place]: 1532 Representative spoke with at [place]: Malachy Mood

## 2018-10-30 NOTE — Progress Notes (Signed)
ANTICOAGULATION CONSULT NOTE - Initial Consult  Pharmacy Consult for Lovenox Indication: chest pain/ACS  Allergies  Allergen Reactions  . Codeine Swelling  . Levofloxacin     Other reaction(s): Malaise (intolerance)  . Penicillins Swelling    Has patient had a PCN reaction causing immediate rash, facial/tongue/throat swelling, SOB or lightheadedness with hypotension:YES Has patient had a PCN reaction causing severe rash involving mucus membranes or skin necrosis: NO Has patient had a PCN reaction that required hospitalization NO Has patient had a PCN reaction occurring within the last 10 years: NO If all of the above answers are "NO", then may proceed with Cephalosporin use.  . Tape Rash    Patient Measurements: Height: 5' 3.5" (161.3 cm) Weight: 266 lb (120.7 kg) IBW/kg (Calculated) : 53.55  Vital Signs: Temp: 97.7 F (36.5 C) (07/31 2230) Temp Source: Oral (07/31 2230) BP: 108/59 (08/01 0000) Pulse Rate: 62 (07/31 2330)  Labs: Recent Labs    10/29/18 2308  HGB 10.6*  HCT 34.4*  PLT 246  CREATININE 0.84  TROPONINIHS 8    Estimated Creatinine Clearance: 64.4 mL/min (by C-G formula based on SCr of 0.84 mg/dL).   Medical History: Past Medical History:  Diagnosis Date  . Anginal pain ([place])   . Arthritis   . Asthma   . CHF (congestive heart failure) ([place])   . COPD (chronic obstructive pulmonary disease) ([place])   . Coronary artery disease   . Depression   . GERD (gastroesophageal reflux disease)   . Headache   . History of hiatal hernia   . Hyperlipidemia   . Hypertension   . Insomnia   . Osteoporosis   . Pneumonia 2013  . PONV (postoperative nausea and vomiting)   . Shortness of breath dyspnea    with exertion    Medications:  Current Facility-Administered Medications on File Prior to Encounter  Medication Dose Route Frequency Provider Last Rate Last Dose  . clindamycin (CLEOCIN) 900 mg in dextrose 5 % 50 mL IVPB  900 mg Intravenous 60 min Pre-Op  Stark Klein, MD       And  . gentamicin (GARAMYCIN) 5 mg/kg in dextrose 5 % 50 mL IVPB  5 mg/kg Intravenous 60 min Pre-Op Stark Klein, MD       Current Outpatient Medications on File Prior to Encounter  Medication Sig Dispense Refill  . albuterol (PROVENTIL HFA;VENTOLIN HFA) 108 (90 BASE) MCG/ACT inhaler Inhale 2 puffs into the lungs every 6 (six) hours as needed.    Marland Kitchen aspirin 81 MG tablet Take 81 mg by mouth daily.    . budesonide-formoterol (SYMBICORT) 160-4.5 MCG/ACT inhaler Inhale 2 puffs into the lungs 2 (two) times daily.    . citalopram (CELEXA) 20 MG tablet Take 20 mg by mouth daily.    . clonazePAM (KLONOPIN) 1 MG tablet Take 1 mg by mouth as needed (sleep).     . diclofenac sodium (VOLTAREN) 1 % GEL APPLY 2 GRAMS  TOPICALLY THREE TIMES DAILY 100 g 0  . glimepiride (AMARYL) 1 MG tablet Take 1 mg by mouth daily.    . metolazone (ZAROXOLYN) 5 MG tablet Take 1 tablet (5 mg total) by mouth daily. 90 tablet 3  . metoprolol succinate (TOPROL-XL) 25 MG 24 hr tablet Take 25 mg by mouth daily.    . pantoprazole (PROTONIX) 40 MG tablet Take 40 mg by mouth 2 (two) times daily.    . potassium chloride (MICRO-K) 10 MEQ CR capsule Take 10 mEq by mouth daily.    Marland Kitchen  rOPINIRole (REQUIP) 0.25 MG tablet Take 0.25 mg by mouth at bedtime.    . rosuvastatin (CRESTOR) 40 MG tablet Take 40 mg by mouth daily.    . Vitamin D, Ergocalciferol, (DRISDOL) 50000 UNITS CAPS capsule Take 50,000 Units by mouth every Sunday. No specific days       Assessment: 85 y.o. female with chest pain for Lovenox Goal of Therapy:  Full anticoagulation with Lovenox Monitor platelets by anticoagulation protocol: Yes   Plan:  Lovenox 120 mg SQ q12h  Caryl Pina 10/30/2018,1:07 AM

## 2018-10-30 NOTE — H&P (Signed)
History and Physical   Danielle [name] RDE:[id number] DOB: Aug 20, 1933 DOA: 10/29/2018  Referring MD/NP/PA: Dr. Christy Gentles  PCP: [name], MD   Patient coming from: Home  Chief Complaint: Chest pain  HPI: Danielle [name] is a 85 y.o. female with medical history significant of hypertension, diabetes, hyperlipidemia, diastolic CHF, COPD, coronary artery disease, GERD and insomnia who presented to the ER with substernal chest pain for 3 to 6 hours prior to arrival.  It is sharp rated as 7 out of 10 worsened with activities and relieved with rest.  Chest pain is gone now in the ER.  Patient has had prior MI and symptoms felt like similar symptoms.  No radiation.  No diaphoresis.  Initial enzymes and EKG appear to be stable and patient is being admitted mainly for rule out MI..  ED Course: Temperature is 97.7 blood pressure 157/55 pulse 66 respirate of 24 oxygen sat 95% on room air.  Hemoglobin is 10.6 otherwise CBC within normal sodium 133 potassium 4.2 chloride 96.  Creatinine is 0.84.  Chest x-ray showed no active findings.  COVID-19 screen is negative.  Patient is being admitted for rule out MI.  Review of Systems: As per HPI otherwise 10 point review of systems negative.    Past Medical History:  Diagnosis Date  . Anginal pain ([place])   . Arthritis   . Asthma   . CHF (congestive heart failure) ([place])   . COPD (chronic obstructive pulmonary disease) ([place])   . Coronary artery disease   . Depression   . GERD (gastroesophageal reflux disease)   . Headache   . History of hiatal hernia   . Hyperlipidemia   . Hypertension   . Insomnia   . Osteoporosis   . Pneumonia 2013  . PONV (postoperative nausea and vomiting)   . Shortness of breath dyspnea    with exertion    Past Surgical History:  Procedure Laterality Date  . ABDOMINAL HYSTERECTOMY    . APPENDECTOMY    . BREAST SURGERY  40 yrs. ago   growth removed in each breast-benign  . CARDIAC CATHETERIZATION     12/14/12 LHC ([facility]): few mild stenotic areas max 40% of ectatic RCA o/w NL coronaries, EF 65%. Med tx.  Marland Kitchen CATARACT EXTRACTION W/ INTRAOCULAR LENS  IMPLANT, BILATERAL Bilateral 15 yrs ago  . CHOLECYSTECTOMY    . FOOT SURGERY    . LAPAROSCOPIC APPENDECTOMY N/A 02/19/2015   Procedure: EXCISION OF MESENTERIC MASS;  Surgeon: Stark Klein, MD;  Location: [facility];  Service: General;  Laterality: N/A;  . REPLACEMENT TOTAL KNEE BILATERAL       reports that she quit smoking about 2 years ago. Her smoking use included cigarettes. She smoked 0.25 packs per day. She has never used smokeless tobacco. She reports that she does not drink alcohol or use drugs.  Allergies  Allergen Reactions  . Codeine Swelling  . Levofloxacin     Other reaction(s): Malaise (intolerance)  . Penicillins Swelling    Has patient had a PCN reaction causing immediate rash, facial/tongue/throat swelling, SOB or lightheadedness with hypotension:YES Has patient had a PCN reaction causing severe rash involving mucus membranes or skin necrosis: NO Has patient had a PCN reaction that required hospitalization NO Has patient had a PCN reaction occurring within the last 10 years: NO If all of the above answers are "NO", then may proceed with Cephalosporin use.  . Tape Rash    Family History  Family history unknown: Yes  Prior to Admission medications   Medication Sig Start Date End Date Taking? Authorizing Provider  albuterol (PROVENTIL HFA;VENTOLIN HFA) 108 (90 BASE) MCG/ACT inhaler Inhale 2 puffs into the lungs every 6 (six) hours as needed.    [provider]  aspirin 81 MG tablet Take 81 mg by mouth daily.    [provider]  budesonide-formoterol (SYMBICORT) 160-4.5 MCG/ACT inhaler Inhale 2 puffs into the lungs 2 (two) times daily.    [provider]  citalopram (CELEXA) 20 MG tablet Take 20 mg by mouth daily. 10/12/16   [provider]  clonazePAM (KLONOPIN) 1 MG tablet Take 1 mg by mouth  as needed (sleep).  11/20/14   [provider]  diclofenac sodium (VOLTAREN) 1 % GEL APPLY 2 GRAMS  TOPICALLY THREE TIMES DAILY 09/27/18   Newt Minion, MD  glimepiride (AMARYL) 1 MG tablet Take 1 mg by mouth daily. 08/05/18   [provider]  metolazone (ZAROXOLYN) 5 MG tablet Take 1 tablet (5 mg total) by mouth daily. 09/07/18 12/06/18  Revankar, Reita Cliche, MD  metoprolol succinate (TOPROL-XL) 25 MG 24 hr tablet Take 25 mg by mouth daily. 11/20/14   [provider]  pantoprazole (PROTONIX) 40 MG tablet Take 40 mg by mouth 2 (two) times daily. 11/14/16   [provider]  potassium chloride (MICRO-K) 10 MEQ CR capsule Take 10 mEq by mouth daily. 11/20/14   [provider]  rOPINIRole (REQUIP) 0.25 MG tablet Take 0.25 mg by mouth at bedtime.    [provider]  rosuvastatin (CRESTOR) 40 MG tablet Take 40 mg by mouth daily.    [provider]  Vitamin D, Ergocalciferol, (DRISDOL) 50000 UNITS CAPS capsule Take 50,000 Units by mouth every Sunday. No specific days    [provider]    Physical Exam: Vitals:   10/29/18 2235 10/29/18 2300 10/29/18 2330 10/30/18 0000  BP:  (!) 139/48 (!) 140/55 (!) 108/59  Pulse:  63 62   Resp:  (!) 24 (!) 23 (!) 24  Temp:      TempSrc:      SpO2:  96% 95%   Weight: 120.7 kg     Height: 5' 3.5" (1.613 m)         Constitutional: NAD, calm, comfortable Vitals:   10/29/18 2235 10/29/18 2300 10/29/18 2330 10/30/18 0000  BP:  (!) 139/48 (!) 140/55 (!) 108/59  Pulse:  63 62   Resp:  (!) 24 (!) 23 (!) 24  Temp:      TempSrc:      SpO2:  96% 95%   Weight: 120.7 kg     Height: 5' 3.5" (1.613 m)      Eyes: PERRL, lids and conjunctivae normal ENMT: Mucous membranes are moist. Posterior pharynx clear of any exudate or lesions.Normal dentition.  Neck: normal, supple, no masses, no thyromegaly Respiratory: clear to auscultation bilaterally, no wheezing, no crackles. Normal respiratory effort. No  accessory muscle use.  Cardiovascular: Regular rate and rhythm, no murmurs / rubs / gallops. No extremity edema. 2+ pedal pulses. No carotid bruits.  Abdomen: no tenderness, no masses palpated. No hepatosplenomegaly. Bowel sounds positive.  Musculoskeletal: no clubbing / cyanosis. No joint deformity upper and lower extremities. Good ROM, no contractures. Normal muscle tone.  Skin: no rashes, lesions, ulcers. No induration Neurologic: CN 2-12 grossly intact. Sensation intact, DTR normal. Strength 5/5 in all 4.  Psychiatric: Normal judgment and insight. Alert and oriented x 3. Normal mood.     Labs  on Admission: I have personally reviewed following labs and imaging studies  CBC: Recent Labs  Lab 10/29/18 2308  WBC 8.5  HGB 10.6*  HCT 34.4*  MCV 87.1  PLT 505   Basic Metabolic Panel: Recent Labs  Lab 10/29/18 2308  NA 133*  K 4.2  CL 96*  CO2 27  GLUCOSE 278*  BUN 22  CREATININE 0.84  CALCIUM 9.0   GFR: Estimated Creatinine Clearance: 64.4 mL/min (by C-G formula based on SCr of 0.84 mg/dL). Liver Function Tests: No results for input(s): AST, ALT, ALKPHOS, BILITOT, PROT, ALBUMIN in the last 168 hours. No results for input(s): LIPASE, AMYLASE in the last 168 hours. No results for input(s): AMMONIA in the last 168 hours. Coagulation Profile: No results for input(s): INR, PROTIME in the last 168 hours. Cardiac Enzymes: No results for input(s): CKTOTAL, CKMB, CKMBINDEX, TROPONINI in the last 168 hours. BNP (last 3 results) Recent Labs    09/07/18 0956  PROBNP 253   HbA1C: No results for input(s): HGBA1C in the last 72 hours. CBG: No results for input(s): GLUCAP in the last 168 hours. Lipid Profile: No results for input(s): CHOL, HDL, LDLCALC, TRIG, CHOLHDL, LDLDIRECT in the last 72 hours. Thyroid Function Tests: No results for input(s): TSH, T4TOTAL, FREET4, T3FREE, THYROIDAB in the last 72 hours. Anemia Panel: No results for input(s): VITAMINB12, FOLATE, FERRITIN,  TIBC, IRON, RETICCTPCT in the last 72 hours. Urine analysis:    Component Value Date/Time   COLORURINE AMBER (A) 03/12/2015 2006   APPEARANCEUR CLEAR 03/12/2015 2006   LABSPEC 1.031 (H) 03/12/2015 2006   PHURINE 5.0 03/12/2015 2006   GLUCOSEU NEGATIVE 03/12/2015 2006   HGBUR NEGATIVE 03/12/2015 2006   BILIRUBINUR SMALL (A) 03/12/2015 2006   KETONESUR NEGATIVE 03/12/2015 2006   PROTEINUR NEGATIVE 03/12/2015 2006   UROBILINOGEN 1.0 02/12/2015 1105   NITRITE NEGATIVE 03/12/2015 2006   LEUKOCYTESUR NEGATIVE 03/12/2015 2006   Sepsis Labs: @LABRCNTIP (procalcitonin:4,lacticidven:4) )No results found for this or any previous visit (from the past 240 hour(s)).   Radiological Exams on Admission: Dg Chest Portable 1 View  Result Date: 10/29/2018 CLINICAL DATA:  Chest pain EXAM: PORTABLE CHEST 1 VIEW COMPARISON:  September 07, 2018 FINDINGS: The heart remains enlarged. Aortic calcifications are noted. There is no pneumothorax or large pleural effusion. Atelectasis or scarring is noted at the lung bases. There are old healed right-sided rib fractures. There is no acute osseous abnormality. IMPRESSION: No active disease. Electronically Signed   By: Constance Holster M.D.   On: 10/29/2018 23:02    EKG: Independently reviewed.  It shows sinus rhythm with a rate of 64.  No specific ST changes  Assessment/Plan Principal Problem:   Unstable angina (HCC) Active Problems:   Depression   Chronic diastolic congestive heart failure (HCC)   Coronary artery disease involving native coronary artery of native heart without angina pectoris   Mixed hyperlipidemia   CKD (chronic kidney disease) stage 3, GFR 30-59 ml/min (HCC)   Morbid obesity (HCC)   Gastroesophageal reflux disease without esophagitis     #1 unstable angina: Patient has history of coronary artery disease which is remote.  Also ongoing significant risk factors as described above.  We will admit for observation.  Check serial enzymes.  Patient  will be on beta-blockers anticoagulation aspirin and statin.  #2 chronic kidney disease stage III: BUN/creatinine looks normal at the moment.  We will continue close monitoring.  #3 hyperlipidemia: Continue Crestor.  #4 diabetes: Continue Amaryl plus sliding scale insulin.  #5  CHF: Compensated.  Continue home regimen.   DVT prophylaxis: Lovenox Code Status: Full code Family Communication: No family at bedside Disposition Plan: Home Consults called: None Admission status: Observation  Severity of Illness: The appropriate patient status for this patient is OBSERVATION. Observation status is judged to be reasonable and necessary in order to provide the required intensity of service to ensure the patient's safety. The patient's presenting symptoms, physical exam findings, and initial radiographic and laboratory data in the context of their medical condition is felt to place them at decreased risk for further clinical deterioration. Furthermore, it is anticipated that the patient will be medically stable for discharge from the hospital within 2 midnights of admission. The following factors support the patient status of observation.   " The patient's presenting symptoms include chest pain. " The physical exam findings include no significant findings on exam. " The initial radiographic and laboratory data are normal EKG and enzymes.     Barbette Merino MD [facility] Pager [phone]  If 7PM-7AM, please contact night-coverage [email or web address] Password [email or web address]  10/30/2018, 1:04 AM

## 2018-11-02 ENCOUNTER — Other Ambulatory Visit: Payer: Self-pay

## 2018-11-02 ENCOUNTER — Encounter: Payer: Self-pay | Admitting: Cardiology

## 2018-11-02 ENCOUNTER — Ambulatory Visit (INDEPENDENT_AMBULATORY_CARE_PROVIDER_SITE_OTHER): Payer: PPO | Admitting: Cardiology

## 2018-11-02 ENCOUNTER — Telehealth: Payer: Self-pay | Admitting: Gastroenterology

## 2018-11-02 ENCOUNTER — Ambulatory Visit: Payer: PPO | Admitting: Cardiology

## 2018-11-02 VITALS — BP 122/62 | HR 68 | Ht 63.5 in | Wt 268.0 lb

## 2018-11-02 DIAGNOSIS — R5381 Other malaise: Secondary | ICD-10-CM | POA: Diagnosis not present

## 2018-11-02 DIAGNOSIS — E1122 Type 2 diabetes mellitus with diabetic chronic kidney disease: Secondary | ICD-10-CM | POA: Diagnosis not present

## 2018-11-02 DIAGNOSIS — J9611 Chronic respiratory failure with hypoxia: Secondary | ICD-10-CM | POA: Diagnosis not present

## 2018-11-02 DIAGNOSIS — E782 Mixed hyperlipidemia: Secondary | ICD-10-CM | POA: Diagnosis not present

## 2018-11-02 DIAGNOSIS — R5383 Other fatigue: Secondary | ICD-10-CM | POA: Diagnosis not present

## 2018-11-02 DIAGNOSIS — I5032 Chronic diastolic (congestive) heart failure: Secondary | ICD-10-CM | POA: Diagnosis not present

## 2018-11-02 DIAGNOSIS — N183 Chronic kidney disease, stage 3 (moderate): Secondary | ICD-10-CM | POA: Diagnosis not present

## 2018-11-02 DIAGNOSIS — R079 Chest pain, unspecified: Secondary | ICD-10-CM | POA: Diagnosis not present

## 2018-11-02 DIAGNOSIS — Z87891 Personal history of nicotine dependence: Secondary | ICD-10-CM | POA: Diagnosis not present

## 2018-11-02 DIAGNOSIS — E538 Deficiency of other specified B group vitamins: Secondary | ICD-10-CM | POA: Diagnosis not present

## 2018-11-02 DIAGNOSIS — I2510 Atherosclerotic heart disease of native coronary artery without angina pectoris: Secondary | ICD-10-CM

## 2018-11-02 DIAGNOSIS — Z9181 History of falling: Secondary | ICD-10-CM | POA: Diagnosis not present

## 2018-11-02 DIAGNOSIS — D509 Iron deficiency anemia, unspecified: Secondary | ICD-10-CM | POA: Diagnosis not present

## 2018-11-02 NOTE — Telephone Encounter (Signed)
Pt reported that her iron is 10.1 g/dL and requested to schedule a colonoscopy.  Pt previous colonoscopy was with Dr. Lyndel Safe in [place]. Please check pt's previous colon and advise whether to schedule direct.

## 2018-11-02 NOTE — Telephone Encounter (Signed)
I have scheduled patient for an office visit with Dr. Lyndel Safe.

## 2018-11-02 NOTE — Patient Instructions (Signed)
Medication Instructions:  Your physician recommends that you continue on your current medications as directed. Please refer to the Current Medication list given to you today.  If you need a refill on your cardiac medications before your next appointment, please call your pharmacy.   Lab work: Your physician recommends that you return for lab work today: Pro bnp, bmp   If you have labs (blood work) drawn today and your tests are completely normal, you will receive your results only by: Marland Kitchen MyChart Message (if you have MyChart) OR . A paper copy in the mail If you have any lab test that is abnormal or we need to change your treatment, we will call you to review the results.  Testing/Procedures: None.   Follow-Up: At [facility], you and your health needs are our priority.  As part of our continuing mission to provide you with exceptional heart care, we have created designated Provider Care Teams.  These Care Teams include your primary Cardiologist (physician) and Advanced Practice Providers (APPs -  Physician Assistants and Nurse Practitioners) who all work together to provide you with the care you need, when you need it. You will need a follow up appointment in 3 months.  Please call our office 2 months in advance to schedule this appointment.  You may see No primary care provider on file. or another member of our [employer] Provider Team in [place]: Shirlee More, MD . Jyl Heinz, MD  Any Other Special Instructions Will Be Listed Below (If Applicable).

## 2018-11-02 NOTE — Progress Notes (Signed)
Cardiology Office Note:    Date:  11/02/2018   ID:  Danielle [name], DOB Feb 09, 1934, MRN [id number]  PCP:  Danielle Dalton, MD  Cardiologist:  Danielle Campus, MD    Referring MD: Danielle Mina., MD   Chief Complaint  Patient presents with  . Follow-up  I was in the hospital  History of Present Illness:    Danielle [name] is a 85 y.o. female complex past medical history which include diastolic congestive heart failure, depression, coronary artery disease, COPD.  Comes today to my office after being in the hospital she is very disappointed because she still feels poorly she describes sharp stabbing-like sensation in her left chest not related to exercise.  She rule out for myocardial infarction fractional flow reserve being done after that which was negative.  I told her that based on all test that we done this is not related to her heart.  She is disappointed with that.  I will ask her to have proBNP done today to see if her shortness of breath is at least partially related to her heart.  We talk again about need to exercise on a regular basis.  Past Medical History:  Diagnosis Date  . Anginal pain ([place])   . Arthritis   . Asthma   . CHF (congestive heart failure) ([place])   . COPD (chronic obstructive pulmonary disease) ([place])   . Coronary artery disease   . Depression   . GERD (gastroesophageal reflux disease)   . Headache   . History of hiatal hernia   . Hyperlipidemia   . Hypertension   . Insomnia   . Osteoporosis   . Pneumonia 2013  . PONV (postoperative nausea and vomiting)   . Shortness of breath dyspnea    with exertion    Past Surgical History:  Procedure Laterality Date  . ABDOMINAL HYSTERECTOMY    . APPENDECTOMY    . BREAST SURGERY  40 yrs. ago   growth removed in each breast-benign  . CARDIAC CATHETERIZATION     12/14/12 LHC ([facility]): few mild stenotic areas max 40% of ectatic RCA o/w NL coronaries, EF 65%. Med tx.  Marland Kitchen CATARACT EXTRACTION W/  INTRAOCULAR LENS  IMPLANT, BILATERAL Bilateral 15 yrs ago  . CHOLECYSTECTOMY    . FOOT SURGERY    . LAPAROSCOPIC APPENDECTOMY N/A 02/19/2015   Procedure: EXCISION OF MESENTERIC MASS;  Surgeon: Stark Klein, MD;  Location: [place];  Service: General;  Laterality: N/A;  . REPLACEMENT TOTAL KNEE BILATERAL      Current Medications: Current Meds  Medication Sig  . albuterol (PROVENTIL HFA;VENTOLIN HFA) 108 (90 BASE) MCG/ACT inhaler Inhale 2 puffs into the lungs every 6 (six) hours as needed for wheezing or shortness of breath.   Marland Kitchen aspirin 81 MG tablet Take 81 mg by mouth daily.  . budesonide-formoterol (SYMBICORT) 160-4.5 MCG/ACT inhaler Inhale 2 puffs into the lungs 2 (two) times daily.  . citalopram (CELEXA) 20 MG tablet Take 20 mg by mouth daily.  . clonazePAM (KLONOPIN) 1 MG tablet Take 1 mg by mouth as needed (sleep).   . diclofenac sodium (VOLTAREN) 1 % GEL APPLY 2 GRAMS  TOPICALLY THREE TIMES DAILY (Patient taking differently: Apply 2 g topically 3 (three) times daily. )  . furosemide (LASIX) 20 MG tablet Take 40 mg by mouth daily.  Marland Kitchen glimepiride (AMARYL) 1 MG tablet Take 1 mg by mouth daily.  . metoprolol succinate (TOPROL-XL) 25 MG 24 hr tablet Take 25 mg by mouth daily.  Marland Kitchen  nitroGLYCERIN (NITROSTAT) 0.4 MG SL tablet Place 1 tablet (0.4 mg total) under the tongue every 5 (five) minutes as needed for chest pain.  . pantoprazole (PROTONIX) 40 MG tablet Take 40 mg by mouth 2 (two) times daily.  . potassium chloride (MICRO-K) 10 MEQ CR capsule Take 10 mEq by mouth daily.  Marland Kitchen rOPINIRole (REQUIP) 0.25 MG tablet Take 0.25 mg by mouth at bedtime.  . rosuvastatin (CRESTOR) 40 MG tablet Take 40 mg by mouth daily.  . Vitamin D, Ergocalciferol, (DRISDOL) 50000 UNITS CAPS capsule Take 50,000 Units by mouth every Sunday. No specific days     Allergies:   Codeine, Levofloxacin, Penicillins, and Tape   Social History   Socioeconomic History  . Marital status: Married    Spouse name: Not on file  .  Number of children: Not on file  . Years of education: Not on file  . Highest education level: Not on file  Occupational History  . Not on file  Social Needs  . Financial resource strain: Not on file  . Food insecurity    Worry: Not on file    Inability: Not on file  . Transportation needs    Medical: Not on file    Non-medical: Not on file  Tobacco Use  . Smoking status: Former Smoker    Packs/day: 0.25    Types: Cigarettes    Quit date: 08/21/2016    Years since quitting: 2.2  . Smokeless tobacco: Never Used  Substance and Sexual Activity  . Alcohol use: No  . Drug use: No  . Sexual activity: Not on file  Lifestyle  . Physical activity    Days per week: Not on file    Minutes per session: Not on file  . Stress: Not on file  Relationships  . Social Herbalist on phone: Not on file    Gets together: Not on file    Attends religious service: Not on file    Active member of club or organization: Not on file    Attends meetings of clubs or organizations: Not on file    Relationship status: Not on file  Other Topics Concern  . Not on file  Social History Narrative  . Not on file     Family History: The patient's Family history is unknown by patient. ROS:   Please see the history of present illness.    All 14 point review of systems negative except as described per history of present illness  EKGs/Labs/Other Studies Reviewed:      Recent Labs: 09/07/2018: NT-Pro BNP 253; TSH 1.860 10/30/2018: ALT 14; BUN 22; Creatinine, Ser 0.83; Hemoglobin 10.1; Magnesium 1.7; Platelets 210; Potassium 4.0; Sodium 131  Recent Lipid Panel    Component Value Date/Time   CHOL 134 10/22/2018 0952   TRIG 135 10/22/2018 0952   HDL 40 10/22/2018 0952   CHOLHDL 3.4 10/22/2018 0952   LDLCALC 67 10/22/2018 0952    Physical Exam:    VS:  BP 122/62   Pulse 68   Ht 5' 3.5" (1.613 m)   Wt 268 lb (121.6 kg)   SpO2 93%   BMI 46.73 kg/m     Wt Readings from Last 3  Encounters:  11/02/18 268 lb (121.6 kg)  10/30/18 265 lb 3.2 oz (120.3 kg)  09/17/18 266 lb (120.7 kg)     GEN:  Well nourished, well developed in no acute distress HEENT: Normal NECK: No JVD; No carotid bruits LYMPHATICS: No lymphadenopathy CARDIAC:  RRR, no murmurs, no rubs, no gallops RESPIRATORY:  Clear to auscultation without rales, wheezing or rhonchi  ABDOMEN: Soft, non-tender, non-distended MUSCULOSKELETAL:  No edema; No deformity  SKIN: Warm and dry LOWER EXTREMITIES: no swelling NEUROLOGIC:  Alert and oriented x 3 PSYCHIATRIC:  Normal affect   ASSESSMENT:    1. Coronary artery disease involving native coronary artery of native heart without angina pectoris   2. Chronic diastolic congestive heart failure ([place])   3. Mixed hyperlipidemia    PLAN:    In order of problems listed above:  1. Coronary disease stable last test negative 2. Diastolic congestive heart failure will do proBNP today 3. Mixed dyslipidemia on appropriate medication which I will continue   Medication Adjustments/Labs and Tests Ordered: Current medicines are reviewed at length with the patient today.  Concerns regarding medicines are outlined above.  Orders Placed This Encounter  Procedures  . Pro b natriuretic peptide (BNP)  . Basic metabolic panel   Medication changes: No orders of the defined types were placed in this encounter.   Signed, Park Liter, MD, [facility] 11/02/2018 12:41 PM    [place]

## 2018-11-03 LAB — BASIC METABOLIC PANEL
BUN/Creatinine Ratio: 22 (ref 12–28)
BUN: 17 mg/dL (ref 8–27)
CO2: 25 mmol/L (ref 20–29)
Calcium: 9.4 mg/dL (ref 8.7–10.3)
Chloride: 99 mmol/L (ref 96–106)
Creatinine, Ser: 0.76 mg/dL (ref 0.57–1.00)
GFR calc Af Amer: 84 mL/min/{1.73_m2} (ref >59)
GFR calc non Af Amer: 73 mL/min/{1.73_m2} (ref >59)
Glucose: 321 mg/dL — ABNORMAL HIGH (ref 65–99)
Potassium: 4.4 mmol/L (ref 3.5–5.2)
Sodium: 138 mmol/L (ref 134–144)

## 2018-11-03 LAB — PRO B NATRIURETIC PEPTIDE: NT-Pro BNP: 268 pg/mL (ref 0–738)

## 2018-11-05 ENCOUNTER — Encounter: Payer: Self-pay | Admitting: Gastroenterology

## 2018-11-06 DIAGNOSIS — J4520 Mild intermittent asthma, uncomplicated: Secondary | ICD-10-CM | POA: Diagnosis not present

## 2018-11-06 DIAGNOSIS — J439 Emphysema, unspecified: Secondary | ICD-10-CM | POA: Diagnosis not present

## 2018-11-06 DIAGNOSIS — I509 Heart failure, unspecified: Secondary | ICD-10-CM | POA: Diagnosis not present

## 2018-11-10 ENCOUNTER — Other Ambulatory Visit: Payer: Self-pay

## 2018-11-10 ENCOUNTER — Telehealth: Payer: Self-pay

## 2018-11-10 ENCOUNTER — Telehealth (INDEPENDENT_AMBULATORY_CARE_PROVIDER_SITE_OTHER): Payer: PPO | Admitting: Gastroenterology

## 2018-11-10 ENCOUNTER — Telehealth: Payer: Self-pay | Admitting: Gastroenterology

## 2018-11-10 VITALS — Ht 63.0 in | Wt 268.0 lb

## 2018-11-10 DIAGNOSIS — G4736 Sleep related hypoventilation in conditions classified elsewhere: Secondary | ICD-10-CM | POA: Diagnosis not present

## 2018-11-10 DIAGNOSIS — K625 Hemorrhage of anus and rectum: Secondary | ICD-10-CM

## 2018-11-10 DIAGNOSIS — J9611 Chronic respiratory failure with hypoxia: Secondary | ICD-10-CM | POA: Diagnosis not present

## 2018-11-10 DIAGNOSIS — J439 Emphysema, unspecified: Secondary | ICD-10-CM | POA: Diagnosis not present

## 2018-11-10 DIAGNOSIS — D509 Iron deficiency anemia, unspecified: Secondary | ICD-10-CM

## 2018-11-10 DIAGNOSIS — J431 Panlobular emphysema: Secondary | ICD-10-CM | POA: Diagnosis not present

## 2018-11-10 DIAGNOSIS — I5032 Chronic diastolic (congestive) heart failure: Secondary | ICD-10-CM | POA: Diagnosis not present

## 2018-11-10 DIAGNOSIS — N183 Chronic kidney disease, stage 3 (moderate): Secondary | ICD-10-CM | POA: Diagnosis not present

## 2018-11-10 MED ORDER — HYDROCORTISONE ACETATE 25 MG RE SUPP: 25.0000 mg | Freq: Two times a day (BID) | RECTAL | 0 refills | Status: AC

## 2018-11-10 MED ORDER — SUPREP BOWEL PREP KIT 17.5-3.13-1.6 GM/177ML PO SOLN: 1.0000 | ORAL | 0 refills | Status: DC

## 2018-11-10 NOTE — Telephone Encounter (Signed)
You had sent a request on this patient-can you follow up with this request as the patient was seen by cardiology today

## 2018-11-10 NOTE — Patient Instructions (Addendum)
If you are age 83 or older, your body mass index should be between 23-30. Your Body mass index is 47.47 kg/m. If this is out of the aforementioned range listed, please consider follow up with your Primary Care Provider.  If you are age 60 or younger, your body mass index should be between 19-25. Your Body mass index is 47.47 kg/m. If this is out of the aformentioned range listed, please consider follow up with your Primary Care Provider.   You have been scheduled for a colonoscopy. Please follow written instructions given to you at your visit today.  Please pick up your prep supplies at the pharmacy within the next 1-3 days. If you use inhalers (even only as needed), please bring them with you on the day of your procedure. Your physician has requested that you go to www.startemmi.com and enter the access code given to you at your visit today. This web site gives a general overview about your procedure. However, you should still follow specific instructions given to you by our office regarding your preparation for the procedure.  We have sent the following medications to your pharmacy for you to pick up at your convenience: Suprep Anusol  Please have labs drawn at [facility].   Two days before your procedure: Mix 3 packs (or capfuls) of Miralax in 48 ounces of clear liquid and drink at 6pm.  Thank you,  Dr. Jackquline Denmark

## 2018-11-10 NOTE — Telephone Encounter (Signed)
   Primary Cardiologist: Dr Kirkland Hun  Chart reviewed as part of pre-operative protocol coverage. Given past medical history and time since last visit, based on ACC/AHA guidelines, VALMAI VANDENBERGHE would be at acceptable risk for the planned procedure without further cardiovascular testing.   OK to hold aspirin pre op if needed.   I will route this recommendation to the requesting party via Epic fax function and remove from pre-op pool.  Please call with questions.  Kerin Ransom, PA-C 11/10/2018, 2:42 PM

## 2018-11-10 NOTE — Progress Notes (Signed)
Chief Complaint:   Referring Provider: Dr Danielle [name]. Danielle [name], *      ASSESSMENT AND PLAN;   #1. Rectal bleeding.  Decreasing hemoglobin from 11.5 (08/2018) to 10.1 (10/30/2018).  #2. IDA  #3. Co-morbid conditions- COPD, dCHF, DM2  #4. FH colon cancer (brother, Dx at age 83).  Plan: - Pt to see Danielle Danielle [name] today (pulm). Also clearence for colon - Pl get cardio clearence from Danielle Danielle [name] ASAP. - Proceed with colon next week with 2 day prep. I think she meets [facility] citerion. Run it by Danielle [name] - Anusol HC supp I BID x 14 days. - CBC, CMP next week. Can be done in [place].   HPI:    Danielle [name] is a 85 y.o. female  Was seen through tele-visit since she had appointment with pulmonary today as well.  She is currently on her way to see Danielle [name].  With rectal bleeding x over last 1 week Denies having any significant diarrhea or constipation. Hemoglobin has been dropping down gradually. She was seen by Danielle. Bea [name] and is advised colonoscopy. No significant abdominal or rectal pain.  No weight loss.  She was admitted to Danielle [name] 7/31-10/30/2018 with chest pains.  Cardiac evaluation including CTA and echo was unremarkable.  MI was ruled out.  Iron studies on 11/02/2018 showed iron 27, ferritin 13, percent saturation of 7%, TSH 1.29.  Normal creatinine at 0.76.    Colon 03/2013-poor preparation, colonic polyps s/p polypectomy, sigmoid diverticulosis, hemorrhoids. Bx- TAs CT 01/2011 neg x DJD.  SH-brother was patient of ours. Past Medical History:  Diagnosis Date   Anginal pain ([place])    Arthritis    Asthma    CHF (congestive heart failure) ([facility])    COPD (chronic obstructive pulmonary disease) ([facility])    Coronary artery disease    Depression    GERD (gastroesophageal reflux disease)    Headache    History of hiatal hernia    Hyperlipidemia    Hypertension    Insomnia    Osteoporosis    Pneumonia 2013   PONV (postoperative nausea and vomiting)     Shortness of breath dyspnea    with exertion    Past Surgical History:  Procedure Laterality Date   ABDOMINAL HYSTERECTOMY     APPENDECTOMY     BREAST SURGERY  40 yrs. ago   growth removed in each breast-benign   CARDIAC CATHETERIZATION     12/14/12 LHC ([facility]): few mild stenotic areas max 40% of ectatic RCA o/w NL coronaries, EF 65%. Med tx.   CATARACT EXTRACTION W/ INTRAOCULAR LENS  IMPLANT, BILATERAL Bilateral 15 yrs ago   CHOLECYSTECTOMY     COLONOSCOPY  04/22/2013   Diverticulosis in the sigmoid colon. Non bleeding internal hemorrhoids. Normal mucosa vascular pattern in the entire examined colon. Three 6 mm polyps n the descending colon. Resected and retrieved.    FOOT SURGERY     LAPAROSCOPIC APPENDECTOMY N/A 02/19/2015   Procedure: EXCISION OF MESENTERIC MASS;  Surgeon: Stark Klein, MD;  Location: [place];  Service: General;  Laterality: N/A;   REPLACEMENT TOTAL KNEE BILATERAL      Family History  Family history unknown: Yes    Social History   Tobacco Use   Smoking status: Former Smoker    Packs/day: 0.25    Types: Cigarettes    Quit date: 08/21/2016    Years since quitting: 2.2   Smokeless tobacco: Never Used  Substance Use Topics   Alcohol use: No  Drug use: No    Current Outpatient Medications  Medication Sig Dispense Refill   albuterol (PROVENTIL HFA;VENTOLIN HFA) 108 (90 BASE) MCG/ACT inhaler Inhale 2 puffs into the lungs every 6 (six) hours as needed for wheezing or shortness of breath.      aspirin 81 MG tablet Take 81 mg by mouth daily.     budesonide-formoterol (SYMBICORT) 160-4.5 MCG/ACT inhaler Inhale 2 puffs into the lungs 2 (two) times daily.     citalopram (CELEXA) 20 MG tablet Take 20 mg by mouth daily.     clonazePAM (KLONOPIN) 1 MG tablet Take 1 mg by mouth as needed (sleep).      diclofenac sodium (VOLTAREN) 1 % GEL APPLY 2 GRAMS  TOPICALLY THREE TIMES DAILY (Patient taking differently: Apply 2 g topically at bedtime. ) 100 g 0     furosemide (LASIX) 20 MG tablet Take 40 mg by mouth daily.     glimepiride (AMARYL) 4 MG tablet Take 4 mg by mouth daily.     metoprolol succinate (TOPROL-XL) 25 MG 24 hr tablet Take 25 mg by mouth daily.     nitroGLYCERIN (NITROSTAT) 0.4 MG SL tablet Place 1 tablet (0.4 mg total) under the tongue every 5 (five) minutes as needed for chest pain. (Patient not taking: Reported on 11/09/2018) 100 tablet 3   pantoprazole (PROTONIX) 40 MG tablet Take 40 mg by mouth 2 (two) times daily.     potassium chloride (MICRO-K) 10 MEQ CR capsule Take 10 mEq by mouth daily.     rOPINIRole (REQUIP) 0.25 MG tablet Take 0.25 mg by mouth at bedtime.     rosuvastatin (CRESTOR) 40 MG tablet Take 40 mg by mouth daily.     Vitamin D, Ergocalciferol, (DRISDOL) 50000 UNITS CAPS capsule Take 50,000 Units by mouth every Sunday. No specific days     No current facility-administered medications for this visit.    Facility-Administered Medications Ordered in Other Visits  Medication Dose Route Frequency Provider Last Rate Last Dose   clindamycin (CLEOCIN) 900 mg in dextrose 5 % 50 mL IVPB  900 mg Intravenous 60 min Pre-Op Stark Klein, MD       And   gentamicin (GARAMYCIN) 5 mg/kg in dextrose 5 % 50 mL IVPB  5 mg/kg Intravenous 60 min Pre-Op Stark Klein, MD        Allergies  Allergen Reactions   Codeine Swelling   Levofloxacin     Other reaction(s): Malaise (intolerance)   Penicillins Swelling    Has patient had a PCN reaction causing immediate rash, facial/tongue/throat swelling, SOB or lightheadedness with hypotension:YES Has patient had a PCN reaction causing severe rash involving mucus membranes or skin necrosis: NO Has patient had a PCN reaction that required hospitalization NO Has patient had a PCN reaction occurring within the last 10 years: NO If all of the above answers are "NO", then may proceed with Cephalosporin use.   Tape Rash    Review of Systems:  Constitutional: Denies fever,  chills, diaphoresis, appetite change and fatigue.  HEENT: Denies photophobia, eye pain, redness, hearing loss, ear pain, congestion, sore throat, rhinorrhea, sneezing, mouth sores, neck pain, neck stiffness and tinnitus.   Respiratory: Has SOB, DOE, cough, chest tightness,  and wheezing.   Cardiovascular: Denies chest pain, palpitations and leg swelling.  Genitourinary: Denies dysuria, urgency, frequency, hematuria, flank pain and difficulty urinating.  Musculoskeletal: Denies myalgias, back pain, joint swelling, arthralgias and gait problem.  Skin: No rash.  Neurological: Denies dizziness, seizures, syncope, weakness, light-headedness,  numbness and headaches.  Hematological: Denies adenopathy. Easy bruising, personal or family bleeding history  Psychiatric/Behavioral: No anxiety or depression     Physical Exam:    Ht 5\' 3"  (1.6 m)    Wt 268 lb (121.6 kg)    BMI 47.47 kg/m  Filed Weights   11/10/18 0815  Weight: 268 lb (121.6 kg)   Televist  Data Reviewed: I have personally reviewed following labs and imaging studies  CBC: CBC Latest Ref Rng & Units 10/30/2018 10/29/2018 09/07/2018  WBC 4.0 - 10.5 K/uL 7.5 8.5 7.7  Hemoglobin 12.0 - 15.0 g/dL 10.1(L) 10.6(L) 11.5  Hematocrit 36.0 - 46.0 % 32.6(L) 34.4(L) 37.6  Platelets 150 - 400 K/uL 210 246 239    CMP: CMP Latest Ref Rng & Units 11/02/2018 10/30/2018 10/29/2018  Glucose 65 - 99 mg/dL 321(H) 298(H) 278(H)  BUN 8 - 27 mg/dL 17 22 22   Creatinine 0.57 - 1.00 mg/dL 0.76 0.83 0.84  Sodium 134 - 144 mmol/L 138 131(L) 133(L)  Potassium 3.5 - 5.2 mmol/L 4.4 4.0 4.2  Chloride 96 - 106 mmol/L 99 101 96(L)  CO2 20 - 29 mmol/L 25 26 27   Calcium 8.7 - 10.3 mg/dL 9.4 8.4(L) 9.0  Total Protein 6.5 - 8.1 g/dL - 5.7(L) -  Total Bilirubin 0.3 - 1.2 mg/dL - 0.7 -  Alkaline Phos 38 - 126 U/L - 70 -  AST 15 - 41 U/L - 19 -  ALT 0 - 44 U/L - 14 -     Radiology Studies: Ct Coronary Morph W/cta Cor W/score W/ca W/cm &/or Wo/cm  Addendum Date:  10/12/2018   ADDENDUM REPORT: 10/12/2018 23:02 CLINICAL DATA:  85 year old female with chest pain. EXAM: Cardiac/Coronary  CT TECHNIQUE: The patient was scanned on a [employer]. FINDINGS: A 120 kV prospective scan was triggered in the descending thoracic aorta at 111 HU's. Axial non-contrast 3 mm slices were carried out through the heart. The data set was analyzed on a dedicated work station and scored using the [place]. Gantry rotation speed was 250 msecs and collimation was .6 mm. No beta blockade and 0.8 mg of sl NTG was given. The 3D data set was reconstructed in 5% intervals of the 67-82 % of the R-R cycle. Diastolic phases were analyzed on a dedicated work station using MPR, MIP and VRT modes. The patient received 80 cc of contrast. Aorta: Normal size. Mild diffuse atherosclerotic plaque and calcifications. No dissection. Aortic Valve:  Trileaflet.  No calcifications. Coronary Arteries:  Normal coronary origin.  Right dominance. RCA is a very large dominant artery that gives rise to PDA and PLA. There is mild diffuse calcified plaque throughout the entire RCA with stenosis 25-49%. Left main is a large artery that gives rise to LAD and LCX arteries. Left main has no plaque. LAD is a large vessel that gives rise to two small diagonal arteries. Proximal and mid LAD has a mild calcified plaque with stenosis 25-49%. LCX is a non-dominant artery that gives rise to one OM1 branch. There is moderate mixed plaque in the mid LCX artery with stenosis 50-69%. Other findings: Normal pulmonary vein drainage into the left atrium. Normal let atrial appendage without a thrombus. Normal size of the pulmonary artery. IMPRESSION: 1. Coronary calcium score of 1190. This was 95 percentile for age and sex matched control. 2. Normal coronary origin with right dominance. 3. Mild diffuse CAD in the entire RCA, proximal and mid LAD and moderate CAD in the mid LCX artery. Additional  analysis with CT FFR will be  submitted. Electronically Signed   By: Ena Dawley   On: 10/12/2018 23:02   Result Date: 10/12/2018 EXAM: OVER-READ INTERPRETATION CT CHEST The following report is an over-read performed by radiologist Danielle. Evangeline Dakin of [facility], [place] on 10/11/2018. This over-read does not include interpretation of cardiac or coronary anatomy or pathology. The coronary calcium score/coronary CTA interpretation by the cardiologist is attached. COMPARISON:  CTA chest for pulmonary embolism 09/08/2018. FINDINGS: Vascular: Mild to moderate atherosclerosis involving the descending thoracic aorta. No evidence of aortic aneurysm. Mediastinum/Nodes: No pathologic lymphadenopathy within the visualized mediastinum. Visualized esophagus normal in appearance. Lungs/Pleura: Likely benign 4 mm subpleural nodule in the anterolateral RIGHT UPPER LOBE (series 13, image 14), identified on the CT 1 month ago and unchanged in that short interval. Visualized lung parenchyma otherwise clear. No pleural effusions. Central airways patent without significant bronchial wall thickening. Upper Abdomen: Unremarkable for the early arterial phase of enhancement. Musculoskeletal: Degenerative changes and DISH involving the visualized LOWER thoracic spine. IMPRESSION: 1. Likely benign 4 mm subpleural nodule in the anterolateral RIGHT UPPER LOBE, identified on the CT 1 month ago and unchanged in that short interval. Follow-up recommendation was present on that report and is repeated here: No follow-up needed if patient is low-risk. Non-contrast chest CT can be considered in 12 months if patient is high-risk. This recommendation follows the consensus statement: Guidelines for Management of Incidental Pulmonary Nodules Detected on CT Images: From the Fleischner Society 2017; Radiology 2017; 284:228-243. 2.  Aortic Atherosclerosis (ICD10-170.0) 3. No significant extracardiac findings otherwise. Electronically Signed: By: Evangeline Dakin M.D. On:  10/11/2018 15:34   Dg Chest Portable 1 View  Result Date: 10/29/2018 CLINICAL DATA:  Chest pain EXAM: PORTABLE CHEST 1 VIEW COMPARISON:  September 07, 2018 FINDINGS: The heart remains enlarged. Aortic calcifications are noted. There is no pneumothorax or large pleural effusion. Atelectasis or scarring is noted at the lung bases. There are old healed right-sided rib fractures. There is no acute osseous abnormality. IMPRESSION: No active disease. Electronically Signed   By: Constance Holster M.D.   On: 10/29/2018 23:02   Ct Coronary Fractional Flow Reserve Data Prep  Result Date: 10/12/2018 EXAM: CT FFR ANALYSIS CLINICAL DATA:  85 year old female with chest pain. FINDINGS: FFRct analysis was performed on the original cardiac CT angiogram dataset. Diagrammatic representation of the FFRct analysis is provided in a separate PDF document in PACS. This dictation was created using the PDF document and an interactive 3D model of the results. 3D model is not available in the EMR/PACS. Normal FFR range is >0.80. 1. Left Main:  No significant stenosis. 2. LAD: No significant stenosis. 3. LCX: No significant stenosis. 4. RCA: No significant stenosis. IMPRESSION: 1.  CT FFR analysis didn't show any significant stenosis. Electronically Signed   By: Ena Dawley   On: 10/12/2018 23:03   This service was provided via telemedicine. Doxy video was tried but failed due to poor service and patient being in the car. The patient was located in the car.  The provider was located in office.  The patient did consent to this telephone visit and is aware of possible charges through their insurance for this visit.  The patient was referred by Danielle Danielle [name].  Time spent on call/coordination of care: 30 min    Carmell Austria, MD 11/10/2018, 9:04 AM  Cc: Danielle [name], *  Danielle. Bea [name]

## 2018-11-10 NOTE — Telephone Encounter (Signed)
Pt's daughter Janace Hoard called to inform that pt saw her pulmonologist today and he cleared pt for procedure next week.

## 2018-11-10 NOTE — Telephone Encounter (Signed)
[facility] Pre-operative Risk Assessment     Request for surgical clearance:     Endoscopy Procedure  What type of surgery is being performed?     Colonoscopy  When is this surgery scheduled?     11/17/18  What type of clearance is required ?   Pharmacy  Are there any medications that need to be held prior to surgery and how long? no  Practice name and name of physician performing surgery?      [facility]  What is your office phone and fax number?      Phone- [phone]  [phone]  Anesthesia type (None, local, MAC, general) ?       MAC

## 2018-11-11 ENCOUNTER — Encounter: Payer: Self-pay | Admitting: Gastroenterology

## 2018-11-15 DIAGNOSIS — L509 Urticaria, unspecified: Secondary | ICD-10-CM | POA: Diagnosis not present

## 2018-11-16 ENCOUNTER — Telehealth: Payer: Self-pay

## 2018-11-16 NOTE — Telephone Encounter (Signed)
Covid-19 screening questions   Do you now or have you had a fever in the last 14 days? No  Do you have any respiratory symptoms of shortness of breath or cough now or in the last 14 days? No  Do you have any family members or close contacts with diagnosed or suspected Covid-19 in the past 14 days? No  Have you been tested for Covid-19 and found to be positive? No        *Confirmed with daughter Janace Hoard Turner//wr

## 2018-11-17 ENCOUNTER — Encounter: Payer: Self-pay | Admitting: Gastroenterology

## 2018-11-17 ENCOUNTER — Other Ambulatory Visit: Payer: Self-pay

## 2018-11-17 ENCOUNTER — Ambulatory Visit (AMBULATORY_SURGERY_CENTER): Payer: PPO | Admitting: Gastroenterology

## 2018-11-17 VITALS — BP 113/50 | HR 65 | Temp 98.4°F | Resp 17 | Ht 63.0 in | Wt 268.0 lb

## 2018-11-17 DIAGNOSIS — D123 Benign neoplasm of transverse colon: Secondary | ICD-10-CM | POA: Diagnosis not present

## 2018-11-17 DIAGNOSIS — K648 Other hemorrhoids: Secondary | ICD-10-CM

## 2018-11-17 DIAGNOSIS — K625 Hemorrhage of anus and rectum: Secondary | ICD-10-CM

## 2018-11-17 DIAGNOSIS — K5730 Diverticulosis of large intestine without perforation or abscess without bleeding: Secondary | ICD-10-CM | POA: Diagnosis not present

## 2018-11-17 DIAGNOSIS — D124 Benign neoplasm of descending colon: Secondary | ICD-10-CM

## 2018-11-17 DIAGNOSIS — K6389 Other specified diseases of intestine: Secondary | ICD-10-CM | POA: Diagnosis not present

## 2018-11-17 DIAGNOSIS — D129 Benign neoplasm of anus and anal canal: Secondary | ICD-10-CM

## 2018-11-17 DIAGNOSIS — Z1211 Encounter for screening for malignant neoplasm of colon: Secondary | ICD-10-CM | POA: Diagnosis not present

## 2018-11-17 DIAGNOSIS — D128 Benign neoplasm of rectum: Secondary | ICD-10-CM

## 2018-11-17 MED ORDER — HYDROCORTISONE (PERIANAL) 2.5 % EX CREA: 1.0000 "application " | TOPICAL_CREAM | Freq: Two times a day (BID) | CUTANEOUS | 4 refills | Status: AC

## 2018-11-17 MED ORDER — SODIUM CHLORIDE 0.9 % IV SOLN: 500.0000 mL | Freq: Once | INTRAVENOUS | Status: DC

## 2018-11-17 NOTE — Progress Notes (Signed)
Called to room to assist during endoscopic procedure.  Patient ID and intended procedure confirmed with present staff. Received instructions for my participation in the procedure from the performing physician.  

## 2018-11-17 NOTE — Progress Notes (Signed)
Pt's states no medical or surgical changes since previsit or office visit.  Covid- June Vitals- Courtney  

## 2018-11-17 NOTE — Progress Notes (Signed)
A and O x3. Report to RN. Tolerated MAC anesthesia well.

## 2018-11-17 NOTE — Op Note (Signed)
[facility] Patient Name: Danielle [name] Procedure Date: 11/17/2018 11:18 AM MRN: [id number] Endoscopist: Jackquline Denmark , MD Age: 85 Referring MD:  Date of Birth: 09/04/1933 Gender: Female Account #: [id number] Procedure:                Colonoscopy Indications:              Rectal bleeding Medicines:                Monitored Anesthesia Care Procedure:                Pre-Anesthesia Assessment:                           - Prior to the procedure, a History and Physical                            was performed, and patient medications and                            allergies were reviewed. The patient's tolerance of                            previous anesthesia was also reviewed. The risks                            and benefits of the procedure and the sedation                            options and risks were discussed with the patient.                            All questions were answered, and informed consent                            was obtained. Prior Anticoagulants: The patient has                            taken no previous anticoagulant or antiplatelet                            agents. ASA Grade Assessment: III - A patient with                            severe systemic disease. After reviewing the risks                            and benefits, the patient was deemed in                            satisfactory condition to undergo the procedure.                           After obtaining informed consent, the colonoscope  was passed under direct vision. Throughout the                            procedure, the patient's blood pressure, pulse, and                            oxygen saturations were monitored continuously. The                            Colonoscope was introduced through the anus and                            advanced to the the cecum, identified by                            appendiceal orifice and ileocecal valve. The                           colonoscopy was performed with moderate difficulty                            due to a tortuous colon. Successful completion of                            the procedure was aided by applying abdominal                            pressure. The patient tolerated the procedure well.                            The quality of the bowel preparation was good                            except in the right side of the colon specially                            cecum where there was adherent stool. Aggressive                            suctioning and aspiration was performed. Over 95%                            of colonic mucosa visualized satisfactorily.                            Terminal ileum could not be intubated due to                            patient's body habitus and anatomy. Scope In: 11:24:44 AM Scope Out: 11:52:18 AM Scope Withdrawal Time: 0 hours 16 minutes 20 seconds  Total Procedure Duration: 0 hours 27 minutes 34 seconds  Findings:                 Two sessile polyps were found in the  proximal                            transverse colon. The polyps were 4 to 8 mm in                            size. Smaller polyp was removed with a cold snare                            and the bigger with a hot snare.                           Three sessile polyps were found in the rectum and                            mid descending colon. The polyps were 4 to 8 mm in                            size. The larger polyp was removed with a hot snare                            and smaller polyps were removed with a cold snare.                           Multiple small and large-mouthed diverticula were                            found in the sigmoid colon and descending colon.                           Moderate melanosis throughout the colon more                            prominent in the right colon.                           Non-bleeding internal hemorrhoids were found during                             retroflexion. The hemorrhoids were moderate.                           The exam was otherwise without abnormality. Complications:            No immediate complications. Estimated Blood Loss:     Estimated blood loss: none. Impression:               -Colonic polyps S/P polypectomy                           -Moderate predominantly sigmoid diverticulosis.                           -Mod internal hemorrhoids (likely etiology of  rectal bleeding. No active bleeding)                           -Melanosis coli.                           -Otherwise normal colonoscopy to cecum. Recommendation:           - Patient has a contact number available for                            emergencies. The signs and symptoms of potential                            delayed complications were discussed with the                            patient. Return to normal activities tomorrow.                            Written discharge instructions were provided to the                            patient.                           - Resume previous diet.                           - Continue present medications.                           - Await pathology results.                           - Repeat colonoscopy for surveillance of multiple                            polyps.                           - HC 2.5% cream PR 1 twice daily after the bowel                            movements and bedtime x 10 days. 4 refills.                           - Return to GI clinic in 8 weeks. D/w [name] (pt's                            daughter). Jackquline Denmark, MD 11/17/2018 12:07:57 PM This report has been signed electronically.

## 2018-11-17 NOTE — Patient Instructions (Addendum)
YOU HAD AN ENDOSCOPIC PROCEDURE TODAY AT [facility]:   Refer to the procedure report that was given to you for any specific questions about what was found during the examination.  If the procedure report does not answer your questions, please call your gastroenterologist to clarify.  If you requested that your care partner not be given the details of your procedure findings, then the procedure report has been included in a sealed envelope for you to review at your convenience later.  YOU SHOULD EXPECT: Some feelings of bloating in the abdomen. Passage of more gas than usual.  Walking can help get rid of the air that was put into your GI tract during the procedure and reduce the bloating. If you had a lower endoscopy (such as a colonoscopy or flexible sigmoidoscopy) you may notice spotting of blood in your stool or on the toilet paper. If you underwent a bowel prep for your procedure, you may not have a normal bowel movement for a few days.  Please Note:  You might notice some irritation and congestion in your nose or some drainage.  This is from the oxygen used during your procedure.  There is no need for concern and it should clear up in a day or so.  SYMPTOMS TO REPORT IMMEDIATELY:   Following lower endoscopy (colonoscopy or flexible sigmoidoscopy):  Excessive amounts of blood in the stool  Significant tenderness or worsening of abdominal pains  Swelling of the abdomen that is new, acute  Fever of 100F or higher   For urgent or emergent issues, a gastroenterologist can be reached at any hour by calling [phone].   DIET:  We do recommend a small meal at first, but then you may proceed to your regular diet.  Drink plenty of fluids but you should avoid alcoholic beverages for 24 hours.  ACTIVITY:  You should plan to take it easy for the rest of today and you should NOT DRIVE or use heavy machinery until tomorrow (because of the sedation medicines used during the test).     FOLLOW UP: Our staff will call the number listed on your records 48-72 hours following your procedure to check on you and address any questions or concerns that you may have regarding the information given to you following your procedure. If we do not reach you, we will leave a message.  We will attempt to reach you two times.  During this call, we will ask if you have developed any symptoms of COVID 19. If you develop any symptoms (ie: fever, flu-like symptoms, shortness of breath, cough etc.) before then, please call [phone].  If you test positive for Covid 19 in the 2 weeks post procedure, please call and report this information to [place].    If any biopsies were taken you will be contacted by phone or by letter within the next 1-3 weeks.  Please call us at [phone] if you have not heard about the biopsies in 3 weeks.    SIGNATURES/CONFIDENTIALITY: You and/or your care partner have signed paperwork which will be entered into your electronic medical record.  These signatures attest to the fact that that the information above on your After Visit Summary has been reviewed and is understood.  Full responsibility of the confidentiality of this discharge information lies with you and/or your care-partner.   Handouts were given to you on polyps, diverticulosis and hemorrhoids. Your blood sugar was 193 in the recovery room. HC 2.5% rectal cream RX was  sent to [facility] in [place]. You may resume your current medications today. Await biopsy results. Return to office for an appointment with daughter, Janace Hoard in 8 weeks.  The office will call you with this appointment. Please call if any questions or concerns.

## 2018-11-17 NOTE — Progress Notes (Addendum)
No problems noted in the recovery room. Maw  Dr. Lyndel Safe reported he spoke with [name], pt's daughter about pt's findings.  Maw  Christell Constant went over discharge instructions with pt and her daughter. maw

## 2018-11-19 ENCOUNTER — Telehealth: Payer: Self-pay | Admitting: Gastroenterology

## 2018-11-19 ENCOUNTER — Telehealth: Payer: Self-pay | Admitting: *Deleted

## 2018-11-19 NOTE — Telephone Encounter (Signed)
Called and spoke with patient's daughter-[name]-verified DPR-informed [name] to ensure patient's rectal area is clean of stool and to apply small amount around/on rectal area; [name] verbalized understanding of information/instructions; [name] also advised to call back to the office should questions/concerns arise or if symptoms worsen for patient or symptoms do not improve;

## 2018-11-19 NOTE — Telephone Encounter (Signed)
  Follow up Call-  Call back number 11/17/2018  Post procedure Call Back phone  # [phone]  Permission to leave phone message Yes  Some recent data might be hidden     Patient questions:  Message left to call us if necessary.

## 2018-11-19 NOTE — Telephone Encounter (Signed)
  Follow up Call-  Call back number 11/17/2018  Post procedure Call Back phone  # [phone]  Permission to leave phone message Yes  Some recent data might be hidden     Patient questions:  Do you have a fever, pain , or abdominal swelling? No. Pain Score  0 *  Have you tolerated food without any problems? Yes.    Have you been able to return to your normal activities? Yes.    Do you have any questions about your discharge instructions: Diet   No. Medications  No. Follow up visit  No.  Do you have questions or concerns about your Care? Yes.    Actions: * If pain score is 4 or above: No action needed, pain <4.  1. Have you developed a fever since your procedure? no  2.   Have you had an respiratory symptoms (SOB or cough) since your procedure? no  3.   Have you tested positive for COVID 19 since your procedure no  4.   Have you had any family members/close contacts diagnosed with the COVID 19 since your procedure?  no   If yes to any of these questions please route to Joylene John, RN and Alphonsa Gin, Therapist, sports.

## 2018-11-23 ENCOUNTER — Encounter: Payer: Self-pay | Admitting: Gastroenterology

## 2018-11-26 DIAGNOSIS — D509 Iron deficiency anemia, unspecified: Secondary | ICD-10-CM | POA: Diagnosis not present

## 2018-11-26 DIAGNOSIS — K6389 Other specified diseases of intestine: Secondary | ICD-10-CM

## 2018-11-26 DIAGNOSIS — N183 Chronic kidney disease, stage 3 (moderate): Secondary | ICD-10-CM | POA: Diagnosis not present

## 2018-11-26 DIAGNOSIS — I5032 Chronic diastolic (congestive) heart failure: Secondary | ICD-10-CM | POA: Diagnosis not present

## 2018-11-26 DIAGNOSIS — K648 Other hemorrhoids: Secondary | ICD-10-CM | POA: Insufficient documentation

## 2018-11-26 DIAGNOSIS — E1122 Type 2 diabetes mellitus with diabetic chronic kidney disease: Secondary | ICD-10-CM | POA: Diagnosis not present

## 2018-11-26 DIAGNOSIS — I2510 Atherosclerotic heart disease of native coronary artery without angina pectoris: Secondary | ICD-10-CM | POA: Diagnosis not present

## 2018-11-26 DIAGNOSIS — J431 Panlobular emphysema: Secondary | ICD-10-CM | POA: Diagnosis not present

## 2018-11-26 DIAGNOSIS — Z6841 Body Mass Index (BMI) 40.0 and over, adult: Secondary | ICD-10-CM | POA: Diagnosis not present

## 2018-11-26 DIAGNOSIS — Z0000 Encounter for general adult medical examination without abnormal findings: Secondary | ICD-10-CM | POA: Diagnosis not present

## 2018-11-26 HISTORY — DX: Other specified diseases of intestine: K63.89

## 2018-11-26 HISTORY — DX: Other hemorrhoids: K64.8

## 2018-12-07 DIAGNOSIS — J439 Emphysema, unspecified: Secondary | ICD-10-CM | POA: Diagnosis not present

## 2018-12-07 DIAGNOSIS — J4520 Mild intermittent asthma, uncomplicated: Secondary | ICD-10-CM | POA: Diagnosis not present

## 2018-12-07 DIAGNOSIS — I509 Heart failure, unspecified: Secondary | ICD-10-CM | POA: Diagnosis not present

## 2018-12-12 ENCOUNTER — Other Ambulatory Visit (INDEPENDENT_AMBULATORY_CARE_PROVIDER_SITE_OTHER): Payer: Self-pay | Admitting: Orthopedic Surgery

## 2018-12-23 DIAGNOSIS — H353131 Nonexudative age-related macular degeneration, bilateral, early dry stage: Secondary | ICD-10-CM | POA: Diagnosis not present

## 2018-12-23 DIAGNOSIS — H04123 Dry eye syndrome of bilateral lacrimal glands: Secondary | ICD-10-CM | POA: Diagnosis not present

## 2018-12-23 DIAGNOSIS — Z961 Presence of intraocular lens: Secondary | ICD-10-CM | POA: Diagnosis not present

## 2018-12-23 DIAGNOSIS — E119 Type 2 diabetes mellitus without complications: Secondary | ICD-10-CM | POA: Diagnosis not present

## 2019-01-06 DIAGNOSIS — J439 Emphysema, unspecified: Secondary | ICD-10-CM | POA: Diagnosis not present

## 2019-01-06 DIAGNOSIS — I509 Heart failure, unspecified: Secondary | ICD-10-CM | POA: Diagnosis not present

## 2019-01-06 DIAGNOSIS — J4520 Mild intermittent asthma, uncomplicated: Secondary | ICD-10-CM | POA: Diagnosis not present

## 2019-01-18 ENCOUNTER — Ambulatory Visit: Payer: PPO | Admitting: Gastroenterology

## 2019-01-18 ENCOUNTER — Encounter: Payer: Self-pay | Admitting: Gastroenterology

## 2019-01-18 ENCOUNTER — Other Ambulatory Visit: Payer: Self-pay

## 2019-01-18 VITALS — BP 130/84 | HR 59 | Temp 97.9°F | Ht 63.0 in | Wt 260.0 lb

## 2019-01-18 DIAGNOSIS — R1310 Dysphagia, unspecified: Secondary | ICD-10-CM

## 2019-01-18 DIAGNOSIS — Z23 Encounter for immunization: Secondary | ICD-10-CM | POA: Diagnosis not present

## 2019-01-18 NOTE — Patient Instructions (Addendum)
If you are age 83 or older, your body mass index should be between 23-30. Your Body mass index is 46.06 kg/m. If this is out of the aforementioned range listed, please consider follow up with your Primary Care Provider.  If you are age 19 or younger, your body mass index should be between 19-25. Your Body mass index is 46.06 kg/m. If this is out of the aformentioned range listed, please consider follow up with your Primary Care Provider.   You have been scheduled for a Barium Esophogram at [facility] (1st floor of the hospital) on 01/24/19 at 10:30am. Please arrive 15 minutes prior to your appointment for registration. Make certain not to have anything to eat or drink 3 hours prior to your test. If you need to reschedule for any reason, please contact radiology at [phone] to do so. __________________________________________________________________ A barium swallow is an examination that concentrates on views of the esophagus. This tends to be a double contrast exam (barium and two liquids which, when combined, create a gas to distend the wall of the oesophagus) or single contrast (non-ionic iodine based). The study is usually tailored to your symptoms so a good history is essential. Attention is paid during the study to the form, structure and configuration of the esophagus, looking for functional disorders (such as aspiration, dysphagia, achalasia, motility and reflux) EXAMINATION You may be asked to change into a gown, depending on the type of swallow being performed. A radiologist and radiographer will perform the procedure. The radiologist will advise you of the type of contrast selected for your procedure and direct you during the exam. You will be asked to stand, sit or lie in several different positions and to hold a small amount of fluid in your mouth before being asked to swallow while the imaging is performed .In some instances you may be asked to swallow barium coated  marshmallows to assess the motility of a solid food bolus. The exam can be recorded as a digital or video fluoroscopy procedure. POST PROCEDURE It will take 1-2 days for the barium to pass through your system. To facilitate this, it is important, unless otherwise directed, to increase your fluids for the next 24-48hrs and to resume your normal diet.  This test typically takes about 30 minutes to perform. __________________________________________________________________________________  Please purchase the following medications over the counter and take as directed: Decrease your colace to only on Monday, Wednesday and Friday.   Follow up in 12 weeks.   Thank you,  Dr. Jackquline Denmark

## 2019-01-18 NOTE — Progress Notes (Signed)
Chief Complaint: FU  Referring Provider:  [name], MD      ASSESSMENT AND PLAN;   #1. Eso dysphagia D/d includes eso stricture, Schatzki's ring, motility disorder, eosinophilic esophagitis, pill induced esophagitis, r/o esophageal Ca or extrinsic lesions.  #2. IDA with rectal bleeding d/t internal hemorrhoids (likely)  #3. Co-morbid conditions- COPD, dCHF, DM2  #4. H/O Colon polyps (10/2018), FH colon cancer (brother, Dx at age 83). Hold off on rpt colonoscopies d/t advanced age.  Plan: - Decrease colace 1 tab MWF. - Ba swallow with Ba tab. - Continue Protonix 40mg  po BID. - If barium swallow is abn, proceed with EGD with dil.  I have explained above to the patient patient's family in detail. - She has scheduled blood tests coming up in Nov 2020 with Dr Bea Graff.  Recommend checking CBC and CMP (in addition to any other blood tests recommended by Dr. Bea Graff). - FU in 12 weeks.   HPI:    Danielle [name] is a 85 y.o. female  For follow-up visit.  She had colonoscopy 11/17/2018 which showed colonic polyps SP polypectomy, melanosis coli, moderate internal hemorrhoids.  Initially she was advised to get colonoscopy performed in 3 years.  However, on review, due to advanced age, we have recommended colonoscopy only if she starts having any new problems.  She has been taking Colace every day which has resulted in softer bowel movements but at the frequency of 4-5 times per day.  She would have occasional rectal bleeding which is better with hydrocortisone.  Today she tells me that she has been having problems swallowing with food getting hung up in the mid chest to mostly solids.  She did not tell me about this previously.  She denies having any significant heartburn.  No odynophagia.  No weight loss.  No melena.  She was admitted to [facility] 7/31-10/30/2018 with chest pains.  Cardiac evaluation including CTA and echo was unremarkable.  MI was ruled out.  Her hemoglobin from  11/15/2018 has improved to 11.4 with MCV 95, normal creatinine.  Iron studies on 11/02/2018 showed iron 27, ferritin 13, percent saturation of 7%, TSH 1.29.  Normal creatinine at 0.76.  SH-brother was patient of ours. Past Medical History:  Diagnosis Date  . Anginal pain ([place])   . Arthritis   . Asthma   . CHF (congestive heart failure) ([place])   . COPD (chronic obstructive pulmonary disease) ([facility])   . Coronary artery disease   . Depression   . Emphysema lung ([place])   . GERD (gastroesophageal reflux disease)   . Headache   . History of hiatal hernia   . Hyperlipidemia   . Hypertension   . Insomnia   . Osteoporosis   . Pneumonia 2013  . PONV (postoperative nausea and vomiting)   . Shortness of breath dyspnea    with exertion    Past Surgical History:  Procedure Laterality Date  . ABDOMINAL HYSTERECTOMY    . APPENDECTOMY    . BREAST SURGERY  40 yrs. ago   growth removed in each breast-benign  . CARDIAC CATHETERIZATION     12/14/12 LHC ([facility]): few mild stenotic areas max 40% of ectatic RCA o/w NL coronaries, EF 65%. Med tx.  Marland Kitchen CATARACT EXTRACTION W/ INTRAOCULAR LENS  IMPLANT, BILATERAL Bilateral 15 yrs ago  . CHOLECYSTECTOMY    . COLONOSCOPY  04/22/2013   Diverticulosis in the sigmoid colon. Non bleeding internal hemorrhoids. Normal mucosa vascular pattern in the entire examined colon. Three 6  mm polyps n the descending colon. Resected and retrieved.   Marland Kitchen FOOT SURGERY    . LAPAROSCOPIC APPENDECTOMY N/A 02/19/2015   Procedure: EXCISION OF MESENTERIC MASS;  Surgeon: Stark Klein, MD;  Location: [facility];  Service: General;  Laterality: N/A;  . REPLACEMENT TOTAL KNEE BILATERAL      Family History  Family history unknown: Yes    Social History   Tobacco Use  . Smoking status: Former Smoker    Packs/day: 0.25    Types: Cigarettes    Quit date: 08/21/2016    Years since quitting: 2.4  . Smokeless tobacco: Never Used  Substance Use Topics  . Alcohol use: No  . Drug use: No     Current Outpatient Medications  Medication Sig Dispense Refill  . albuterol (PROVENTIL HFA;VENTOLIN HFA) 108 (90 BASE) MCG/ACT inhaler Inhale 2 puffs into the lungs every 6 (six) hours as needed for wheezing or shortness of breath.     Marland Kitchen aspirin 81 MG tablet Take 81 mg by mouth daily.    . budesonide-formoterol (SYMBICORT) 160-4.5 MCG/ACT inhaler Inhale 2 puffs into the lungs 2 (two) times daily.    . citalopram (CELEXA) 20 MG tablet Take 20 mg by mouth daily.    . clonazePAM (KLONOPIN) 1 MG tablet Take 1 mg by mouth as needed (sleep).     . diclofenac sodium (VOLTAREN) 1 % GEL APPLY 2 GRAMS TOPICALLY THREE TIMES DAILY 100 g 0  . furosemide (LASIX) 20 MG tablet Take 40 mg by mouth daily.    Marland Kitchen glimepiride (AMARYL) 4 MG tablet Take 4 mg by mouth daily.    Marland Kitchen JANUVIA 25 MG tablet 1 tablet daily.    . metoprolol succinate (TOPROL-XL) 25 MG 24 hr tablet Take 25 mg by mouth daily.    . nitroGLYCERIN (NITROSTAT) 0.4 MG SL tablet Place 1 tablet (0.4 mg total) under the tongue every 5 (five) minutes as needed for chest pain. 100 tablet 3  . pantoprazole (PROTONIX) 40 MG tablet Take 40 mg by mouth 2 (two) times daily.    . potassium chloride (MICRO-K) 10 MEQ CR capsule Take 10 mEq by mouth daily.    Marland Kitchen rOPINIRole (REQUIP) 0.25 MG tablet Take 0.25 mg by mouth at bedtime.    . rosuvastatin (CRESTOR) 40 MG tablet Take 40 mg by mouth daily.    . Vitamin D, Ergocalciferol, (DRISDOL) 50000 UNITS CAPS capsule Take 50,000 Units by mouth every Sunday. No specific days     No current facility-administered medications for this visit.    Facility-Administered Medications Ordered in Other Visits  Medication Dose Route Frequency Provider Last Rate Last Dose  . clindamycin (CLEOCIN) 900 mg in dextrose 5 % 50 mL IVPB  900 mg Intravenous 60 min Pre-Op Stark Klein, MD       And  . gentamicin (GARAMYCIN) 5 mg/kg in dextrose 5 % 50 mL IVPB  5 mg/kg Intravenous 60 min Pre-Op Stark Klein, MD        Allergies   Allergen Reactions  . Codeine Swelling  . Levofloxacin     Other reaction(s): Malaise (intolerance)  . Penicillins Swelling    Has patient had a PCN reaction causing immediate rash, facial/tongue/throat swelling, SOB or lightheadedness with hypotension:YES Has patient had a PCN reaction causing severe rash involving mucus membranes or skin necrosis: NO Has patient had a PCN reaction that required hospitalization NO Has patient had a PCN reaction occurring within the last 10 years: NO If all of the above answers  are "NO", then may proceed with Cephalosporin use.  . Tape Rash    Review of Systems:  Constitutional: Denies fever, chills, diaphoresis, appetite change and fatigue.  HEENT: Denies photophobia, eye pain, redness, hearing loss, ear pain, congestion, sore throat, rhinorrhea, sneezing, mouth sores, neck pain, neck stiffness and tinnitus.   Respiratory: Has SOB, DOE, cough, chest tightness,  and wheezing.   Cardiovascular: Denies chest pain, palpitations and leg swelling.  Genitourinary: Denies dysuria, urgency, frequency, hematuria, flank pain and difficulty urinating.  Musculoskeletal: Denies myalgias, back pain, joint swelling, arthralgias and gait problem.  Skin: No rash.  Neurological: Denies dizziness, seizures, syncope, weakness, light-headedness, numbness and headaches.  Hematological: Denies adenopathy. Easy bruising, personal or family bleeding history  Psychiatric/Behavioral: No anxiety or depression     Physical Exam:    BP 130/84   Pulse (!) 59   Temp 97.9 F (36.6 C)   Ht 5\' 3"  (1.6 m)   Wt 260 lb (117.9 kg)   BMI 46.06 kg/m  Filed Weights   01/18/19 1122  Weight: 260 lb (117.9 kg)   Televist  Data Reviewed: I have personally reviewed following labs and imaging studies  CBC: CBC Latest Ref Rng & Units 10/30/2018 10/29/2018 09/07/2018  WBC 4.0 - 10.5 K/uL 7.5 8.5 7.7  Hemoglobin 12.0 - 15.0 g/dL 10.1(L) 10.6(L) 11.5  Hematocrit 36.0 - 46.0 % 32.6(L)  34.4(L) 37.6  Platelets 150 - 400 K/uL 210 246 239    CMP: CMP Latest Ref Rng & Units 11/02/2018 10/30/2018 10/29/2018  Glucose 65 - 99 mg/dL 321(H) 298(H) 278(H)  BUN 8 - 27 mg/dL 17 22 22   Creatinine 0.57 - 1.00 mg/dL 0.76 0.83 0.84  Sodium 134 - 144 mmol/L 138 131(L) 133(L)  Potassium 3.5 - 5.2 mmol/L 4.4 4.0 4.2  Chloride 96 - 106 mmol/L 99 101 96(L)  CO2 20 - 29 mmol/L 25 26 27   Calcium 8.7 - 10.3 mg/dL 9.4 8.4(L) 9.0  Total Protein 6.5 - 8.1 g/dL - 5.7(L) -  Total Bilirubin 0.3 - 1.2 mg/dL - 0.7 -  Alkaline Phos 38 - 126 U/L - 70 -  AST 15 - 41 U/L - 19 -  ALT 0 - 44 U/L - 14 -  25 minutes spent with the patient today. Greater than 50% was spent in counseling and coordination of care with the patient     Carmell Austria, MD 01/18/2019, 11:33 AM  Cc: [name], MD

## 2019-01-24 ENCOUNTER — Inpatient Hospital Stay (HOSPITAL_COMMUNITY): Admission: RE | Admit: 2019-01-24 | Payer: PPO | Source: Ambulatory Visit

## 2019-01-28 DIAGNOSIS — D485 Neoplasm of uncertain behavior of skin: Secondary | ICD-10-CM | POA: Diagnosis not present

## 2019-01-28 DIAGNOSIS — L820 Inflamed seborrheic keratosis: Secondary | ICD-10-CM | POA: Diagnosis not present

## 2019-02-06 DIAGNOSIS — J439 Emphysema, unspecified: Secondary | ICD-10-CM | POA: Diagnosis not present

## 2019-02-06 DIAGNOSIS — J4520 Mild intermittent asthma, uncomplicated: Secondary | ICD-10-CM | POA: Diagnosis not present

## 2019-02-06 DIAGNOSIS — I509 Heart failure, unspecified: Secondary | ICD-10-CM | POA: Diagnosis not present

## 2019-02-07 ENCOUNTER — Ambulatory Visit (INDEPENDENT_AMBULATORY_CARE_PROVIDER_SITE_OTHER): Payer: PPO | Admitting: Cardiology

## 2019-02-07 ENCOUNTER — Other Ambulatory Visit: Payer: Self-pay

## 2019-02-07 ENCOUNTER — Encounter: Payer: Self-pay | Admitting: Cardiology

## 2019-02-07 VITALS — BP 120/66 | HR 56 | Ht 63.0 in | Wt 267.0 lb

## 2019-02-07 DIAGNOSIS — I5032 Chronic diastolic (congestive) heart failure: Secondary | ICD-10-CM | POA: Diagnosis not present

## 2019-02-07 DIAGNOSIS — J9611 Chronic respiratory failure with hypoxia: Secondary | ICD-10-CM | POA: Diagnosis not present

## 2019-02-07 DIAGNOSIS — R072 Precordial pain: Secondary | ICD-10-CM | POA: Diagnosis not present

## 2019-02-07 NOTE — Patient Instructions (Signed)
Medication Instructions:  Your physician recommends that you continue on your current medications as directed. Please refer to the Current Medication list given to you today.  *If you need a refill on your cardiac medications before your next appointment, please call your pharmacy*  Lab Work: None.  If you have labs (blood work) drawn today and your tests are completely normal, you will receive your results only by: Marland Kitchen MyChart Message (if you have MyChart) OR . A paper copy in the mail If you have any lab test that is abnormal or we need to change your treatment, we will call you to review the results.  Testing/Procedures: None.   Follow-Up: At [facility], you and your health needs are our priority.  As part of our continuing mission to provide you with exceptional heart care, we have created designated Provider Care Teams.  These Care Teams include your primary Cardiologist (physician) and Advanced Practice Providers (APPs -  Physician Assistants and Nurse Practitioners) who all work together to provide you with the care you need, when you need it.  Your next appointment:   4 months  The format for your next appointment:   In Person  Provider:   [name], MD  Other Instructions

## 2019-02-07 NOTE — Progress Notes (Signed)
Cardiology Office Note:    Date:  02/07/2019   ID:  Danielle [name], DOB 1933-11-05, MRN [id number]  PCP:  [name], MD  Cardiologist:  [name], MD    Referring MD: [name], MD   Chief Complaint  Patient presents with  . Follow-up  Cardiac wise him doing well  History of Present Illness:    Danielle [name] is a 85 y.o. female with diastolic congestive heart failure, diabetes, morbid obesity, depression, multiple pains.  She comes today to my office for follow-up overall seems to be doing well cardiac wise.  Denies having any chest pain, tightness, pressure, burning in the chest.  She does see GI specialist for some stomach issues.  He was able to convince her to stop drinking ice the which is a big achievement.  Hopefully that will make her hemoglobin A1c better.  She is scheduled to see her primary care physician within next few weeks hemoglobin A1c CBC Chem-7 will be checked.  Cardiac wise seems to be doing well  Past Medical History:  Diagnosis Date  . Anginal pain ([place])   . Arthritis   . Asthma   . CHF (congestive heart failure) ([place])   . COPD (chronic obstructive pulmonary disease) ([place])   . Coronary artery disease   . Depression   . Emphysema lung ([place])   . GERD (gastroesophageal reflux disease)   . Headache   . History of hiatal hernia   . Hyperlipidemia   . Hypertension   . Insomnia   . Osteoporosis   . Pneumonia 2013  . PONV (postoperative nausea and vomiting)   . Shortness of breath dyspnea    with exertion    Past Surgical History:  Procedure Laterality Date  . ABDOMINAL HYSTERECTOMY    . APPENDECTOMY    . BREAST SURGERY  40 yrs. ago   growth removed in each breast-benign  . CARDIAC CATHETERIZATION     12/14/12 LHC ([facility]): few mild stenotic areas max 40% of ectatic RCA o/w NL coronaries, EF 65%. Med tx.  Marland Kitchen CATARACT EXTRACTION W/ INTRAOCULAR LENS  IMPLANT, BILATERAL Bilateral 15 yrs ago  . CHOLECYSTECTOMY    . COLONOSCOPY   04/22/2013   Diverticulosis in the sigmoid colon. Non bleeding internal hemorrhoids. Normal mucosa vascular pattern in the entire examined colon. Three 6 mm polyps n the descending colon. Resected and retrieved.   Marland Kitchen FOOT SURGERY    . LAPAROSCOPIC APPENDECTOMY N/A 02/19/2015   Procedure: EXCISION OF MESENTERIC MASS;  Surgeon: Stark Klein, MD;  Location: [facility];  Service: General;  Laterality: N/A;  . REPLACEMENT TOTAL KNEE BILATERAL      Current Medications: Current Meds  Medication Sig  . albuterol (PROVENTIL HFA;VENTOLIN HFA) 108 (90 BASE) MCG/ACT inhaler Inhale 2 puffs into the lungs every 6 (six) hours as needed for wheezing or shortness of breath.   Marland Kitchen aspirin 81 MG tablet Take 81 mg by mouth daily.  . budesonide-formoterol (SYMBICORT) 160-4.5 MCG/ACT inhaler Inhale 2 puffs into the lungs 2 (two) times daily.  . citalopram (CELEXA) 20 MG tablet Take 20 mg by mouth daily.  . clonazePAM (KLONOPIN) 1 MG tablet Take 1 mg by mouth as needed (sleep).   . diclofenac sodium (VOLTAREN) 1 % GEL APPLY 2 GRAMS TOPICALLY THREE TIMES DAILY  . furosemide (LASIX) 20 MG tablet Take 40 mg by mouth daily.  Marland Kitchen glimepiride (AMARYL) 4 MG tablet Take 4 mg by mouth daily.  Marland Kitchen JANUVIA 25 MG tablet 1 tablet  daily.  . metoprolol succinate (TOPROL-XL) 25 MG 24 hr tablet Take 25 mg by mouth daily.  . nitroGLYCERIN (NITROSTAT) 0.4 MG SL tablet Place 1 tablet (0.4 mg total) under the tongue every 5 (five) minutes as needed for chest pain.  . pantoprazole (PROTONIX) 40 MG tablet Take 40 mg by mouth 2 (two) times daily.  . potassium chloride (MICRO-K) 10 MEQ CR capsule Take 10 mEq by mouth daily.  Marland Kitchen rOPINIRole (REQUIP) 0.25 MG tablet Take 0.25 mg by mouth at bedtime.  . rosuvastatin (CRESTOR) 40 MG tablet Take 40 mg by mouth daily.  . Vitamin D, Ergocalciferol, (DRISDOL) 50000 UNITS CAPS capsule Take 50,000 Units by mouth every Sunday. No specific days     Allergies:   Codeine, Levofloxacin, Penicillins, and Tape    Social History   Socioeconomic History  . Marital status: Married    Spouse name: Not on file  . Number of children: Not on file  . Years of education: Not on file  . Highest education level: Not on file  Occupational History  . Not on file  Social Needs  . Financial resource strain: Not on file  . Food insecurity    Worry: Not on file    Inability: Not on file  . Transportation needs    Medical: Not on file    Non-medical: Not on file  Tobacco Use  . Smoking status: Former Smoker    Packs/day: 0.25    Types: Cigarettes    Quit date: 08/21/2016    Years since quitting: 2.4  . Smokeless tobacco: Never Used  Substance and Sexual Activity  . Alcohol use: No  . Drug use: No  . Sexual activity: Not on file  Lifestyle  . Physical activity    Days per week: Not on file    Minutes per session: Not on file  . Stress: Not on file  Relationships  . Social Herbalist on phone: Not on file    Gets together: Not on file    Attends religious service: Not on file    Active member of club or organization: Not on file    Attends meetings of clubs or organizations: Not on file    Relationship status: Not on file  Other Topics Concern  . Not on file  Social History Narrative  . Not on file     Family History: The patient's Family history is unknown by patient. ROS:   Please see the history of present illness.    All 14 point review of systems negative except as described per history of present illness  EKGs/Labs/Other Studies Reviewed:      Recent Labs: 09/07/2018: TSH 1.860 10/30/2018: ALT 14; Hemoglobin 10.1; Magnesium 1.7; Platelets 210 11/02/2018: BUN 17; Creatinine, Ser 0.76; NT-Pro BNP 268; Potassium 4.4; Sodium 138  Recent Lipid Panel    Component Value Date/Time   CHOL 134 10/22/2018 0952   TRIG 135 10/22/2018 0952   HDL 40 10/22/2018 0952   CHOLHDL 3.4 10/22/2018 0952   LDLCALC 67 10/22/2018 0952    Physical Exam:    VS:  BP 120/66   Pulse (!) 56    Ht 5\' 3"  (1.6 m)   Wt 267 lb (121.1 kg)   SpO2 95%   BMI 47.30 kg/m     Wt Readings from Last 3 Encounters:  02/07/19 267 lb (121.1 kg)  01/18/19 260 lb (117.9 kg)  11/17/18 268 lb (121.6 kg)     GEN:  Well  nourished, well developed in no acute distress HEENT: Normal NECK: No JVD; No carotid bruits LYMPHATICS: No lymphadenopathy CARDIAC: RRR, no murmurs, no rubs, no gallops RESPIRATORY:  Clear to auscultation without rales, wheezing or rhonchi  ABDOMEN: Soft, non-tender, non-distended MUSCULOSKELETAL:  No edema; No deformity  SKIN: Warm and dry LOWER EXTREMITIES: no swelling NEUROLOGIC:  Alert and oriented x 3 PSYCHIATRIC:  Normal affect   ASSESSMENT:    1. Chronic diastolic congestive heart failure ([place])   2. Chronic respiratory failure with hypoxia ([facility])   3. Precordial pain    PLAN:    In order of problems listed above:  1. Chronic diastolic congestive heart failure appears to be compensated continue present management. 2. Chronic respiratory failure with hypoxia stable on appropriate medications 3. Precordial chest pain this time she did not complain of any. 4. We had a long discussion about healthy lifestyle need to exercise on the regular basis but overall cardiac wise seems to be doing well.  She did have fractional flow reserve done in September 2020 which showed no significant stenosis.   Medication Adjustments/Labs and Tests Ordered: Current medicines are reviewed at length with the patient today.  Concerns regarding medicines are outlined above.  No orders of the defined types were placed in this encounter.  Medication changes: No orders of the defined types were placed in this encounter.   Signed, Park Liter, MD, [facility] 02/07/2019 10:05 AM    [place]

## 2019-02-11 DIAGNOSIS — Z1231 Encounter for screening mammogram for malignant neoplasm of breast: Secondary | ICD-10-CM | POA: Diagnosis not present

## 2019-02-27 ENCOUNTER — Other Ambulatory Visit (INDEPENDENT_AMBULATORY_CARE_PROVIDER_SITE_OTHER): Payer: Self-pay | Admitting: Orthopedic Surgery

## 2019-02-28 DIAGNOSIS — N1831 Chronic kidney disease, stage 3a: Secondary | ICD-10-CM | POA: Diagnosis not present

## 2019-02-28 DIAGNOSIS — E1121 Type 2 diabetes mellitus with diabetic nephropathy: Secondary | ICD-10-CM | POA: Diagnosis not present

## 2019-02-28 DIAGNOSIS — Z79899 Other long term (current) drug therapy: Secondary | ICD-10-CM | POA: Diagnosis not present

## 2019-02-28 DIAGNOSIS — D509 Iron deficiency anemia, unspecified: Secondary | ICD-10-CM | POA: Diagnosis not present

## 2019-02-28 DIAGNOSIS — E782 Mixed hyperlipidemia: Secondary | ICD-10-CM | POA: Diagnosis not present

## 2019-02-28 DIAGNOSIS — I5032 Chronic diastolic (congestive) heart failure: Secondary | ICD-10-CM | POA: Diagnosis not present

## 2019-02-28 DIAGNOSIS — I2510 Atherosclerotic heart disease of native coronary artery without angina pectoris: Secondary | ICD-10-CM | POA: Diagnosis not present

## 2019-04-03 DIAGNOSIS — R05 Cough: Secondary | ICD-10-CM | POA: Diagnosis not present

## 2019-04-03 DIAGNOSIS — Z20828 Contact with and (suspected) exposure to other viral communicable diseases: Secondary | ICD-10-CM | POA: Diagnosis not present

## 2019-04-28 DIAGNOSIS — Z20822 Contact with and (suspected) exposure to covid-19: Secondary | ICD-10-CM | POA: Diagnosis not present

## 2019-04-28 DIAGNOSIS — Z8616 Personal history of COVID-19: Secondary | ICD-10-CM | POA: Diagnosis not present

## 2019-05-10 DIAGNOSIS — J439 Emphysema, unspecified: Secondary | ICD-10-CM | POA: Diagnosis not present

## 2019-05-10 DIAGNOSIS — J431 Panlobular emphysema: Secondary | ICD-10-CM | POA: Diagnosis not present

## 2019-05-10 DIAGNOSIS — I5032 Chronic diastolic (congestive) heart failure: Secondary | ICD-10-CM | POA: Diagnosis not present

## 2019-05-10 DIAGNOSIS — J9611 Chronic respiratory failure with hypoxia: Secondary | ICD-10-CM | POA: Diagnosis not present

## 2019-05-10 DIAGNOSIS — G4736 Sleep related hypoventilation in conditions classified elsewhere: Secondary | ICD-10-CM | POA: Diagnosis not present

## 2019-05-30 DIAGNOSIS — J9611 Chronic respiratory failure with hypoxia: Secondary | ICD-10-CM | POA: Diagnosis not present

## 2019-05-30 DIAGNOSIS — E1121 Type 2 diabetes mellitus with diabetic nephropathy: Secondary | ICD-10-CM | POA: Diagnosis not present

## 2019-05-30 DIAGNOSIS — G4736 Sleep related hypoventilation in conditions classified elsewhere: Secondary | ICD-10-CM | POA: Diagnosis not present

## 2019-05-30 DIAGNOSIS — Z6841 Body Mass Index (BMI) 40.0 and over, adult: Secondary | ICD-10-CM | POA: Diagnosis not present

## 2019-05-30 DIAGNOSIS — E538 Deficiency of other specified B group vitamins: Secondary | ICD-10-CM | POA: Diagnosis not present

## 2019-05-30 DIAGNOSIS — I5032 Chronic diastolic (congestive) heart failure: Secondary | ICD-10-CM | POA: Diagnosis not present

## 2019-05-30 DIAGNOSIS — J439 Emphysema, unspecified: Secondary | ICD-10-CM | POA: Diagnosis not present

## 2019-05-30 DIAGNOSIS — E782 Mixed hyperlipidemia: Secondary | ICD-10-CM | POA: Diagnosis not present

## 2019-05-30 DIAGNOSIS — D509 Iron deficiency anemia, unspecified: Secondary | ICD-10-CM | POA: Diagnosis not present

## 2019-05-30 DIAGNOSIS — N1831 Chronic kidney disease, stage 3a: Secondary | ICD-10-CM | POA: Diagnosis not present

## 2019-05-30 DIAGNOSIS — I2510 Atherosclerotic heart disease of native coronary artery without angina pectoris: Secondary | ICD-10-CM | POA: Diagnosis not present

## 2019-06-07 ENCOUNTER — Ambulatory Visit: Payer: PPO | Admitting: Cardiology

## 2019-06-16 IMAGING — CT CT HEAD W/O CM
4 series · 17 of 47 positions shown, 19 images · non-contrast
Comparison: None.

CLINICAL DATA: Pain after fall

EXAM:
CT HEAD WITHOUT CONTRAST
TECHNIQUE: Contiguous axial images were obtained from the base of the skull
through the vertex without intravenous contrast.

[Series 3: head without · axial · non-contrast · 0.41mm/px · z∈[-149,-29]mm · 7 of 33 slices shown, 9 images]
[im 5/33  brain]
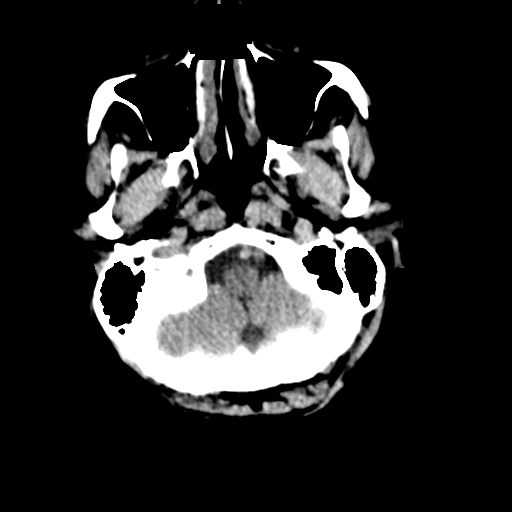
[im 5/33  bone]
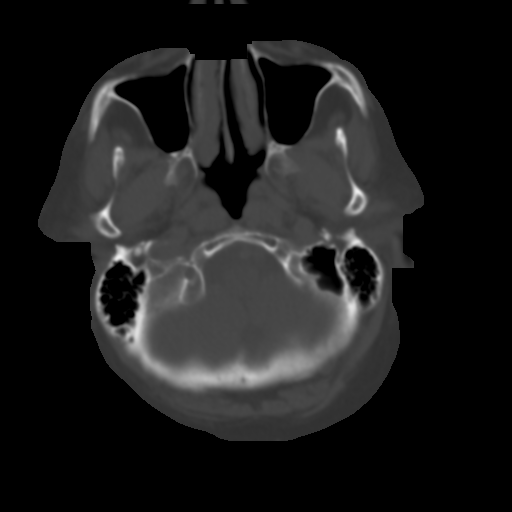
[im 9/33  brain]
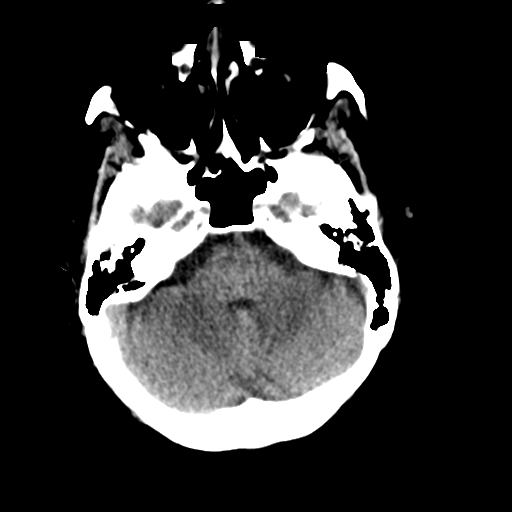
[im 13/33  brain]
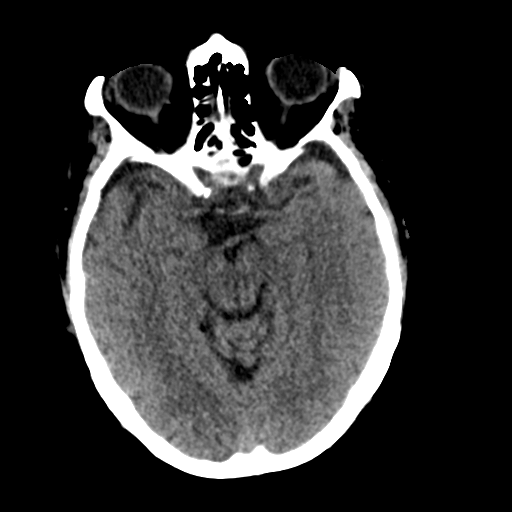
[im 17/33  brain]
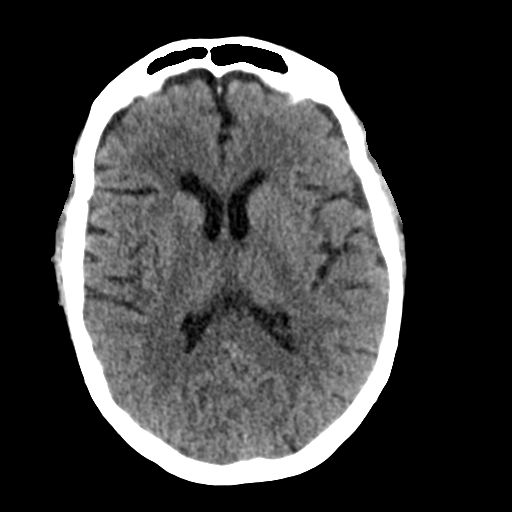
[im 21/33  brain]
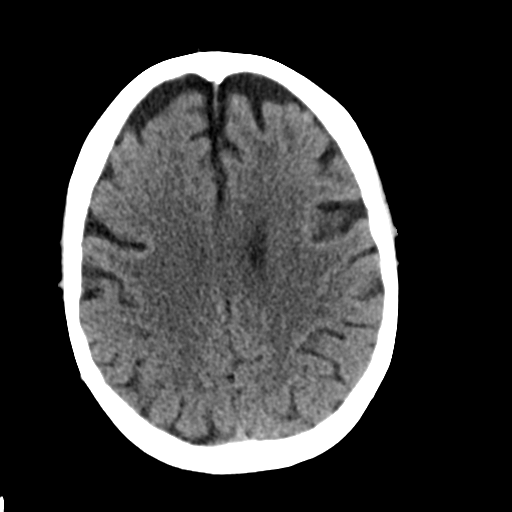
[im 21/33  bone]
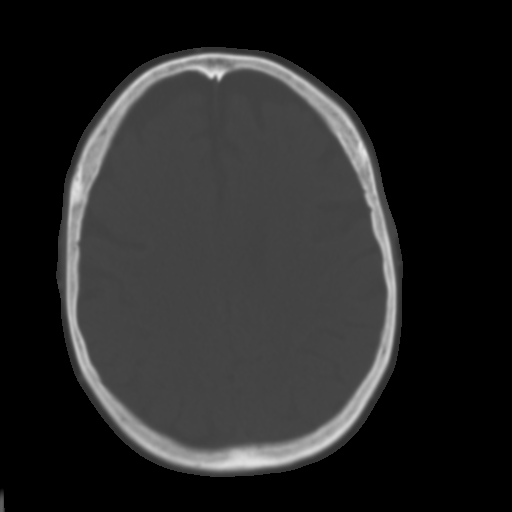
[im 25/33  brain]
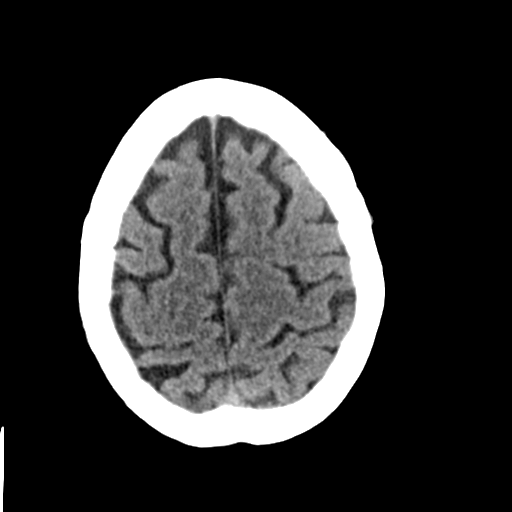
[im 29/33  brain]
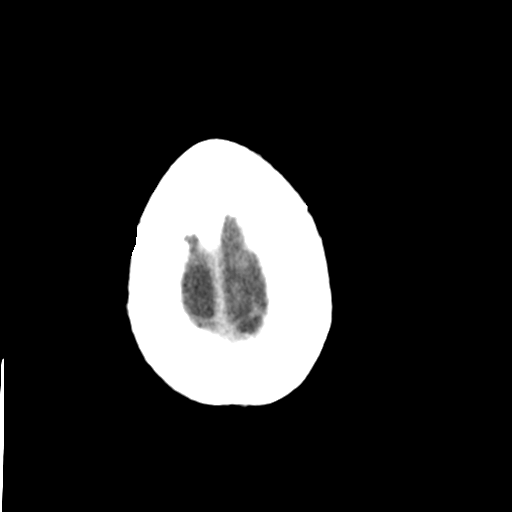

[Series 4: head bone · axial · 0.41mm/px · z∈[-153,-97]mm · 4 of 81 slices shown]
[im 9/81  bone]
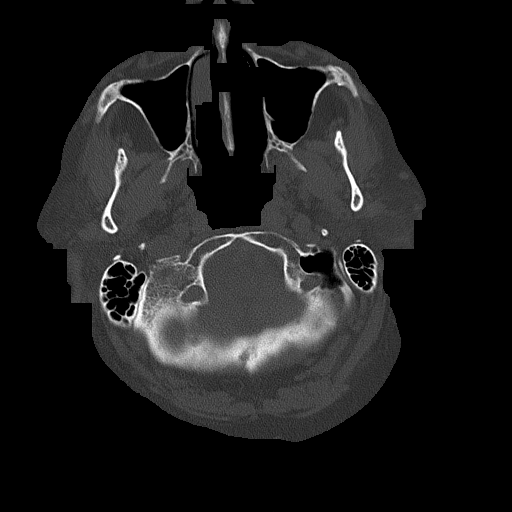
[im 17/81  bone]
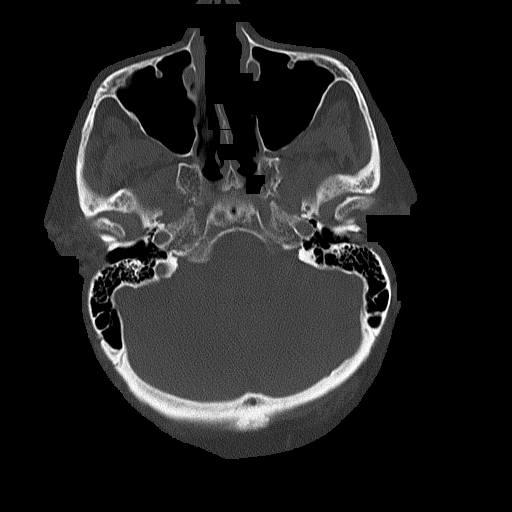
[im 25/81  bone]
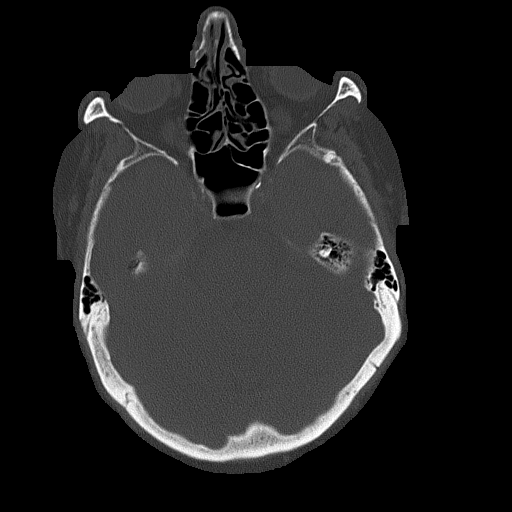
[im 37/81  bone]
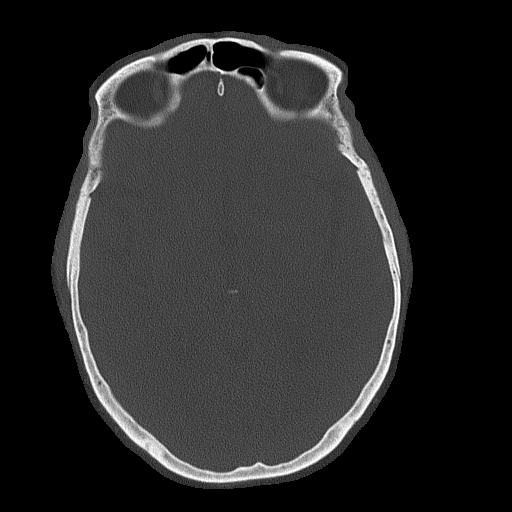

[Series 5: head without cor · coronal · non-contrast · 0.30mm/px · 3 of 64 slices shown]
[im 22/64  brain]
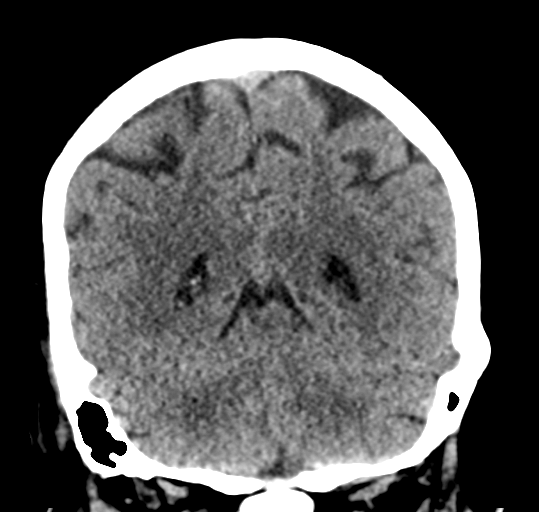
[im 29/64  brain]
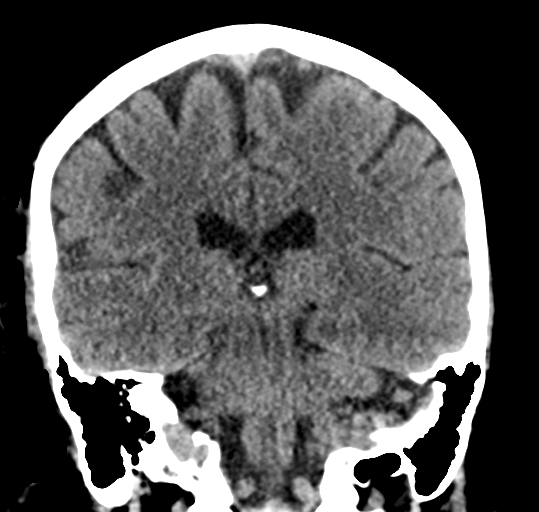
[im 36/64  brain]
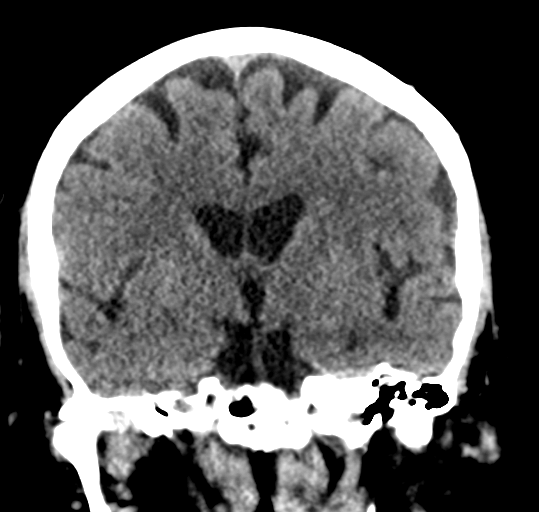

[Series 6: head without sag · sagittal · non-contrast · 0.30mm/px · 3 of 53 slices shown]
[im 18/53  brain]
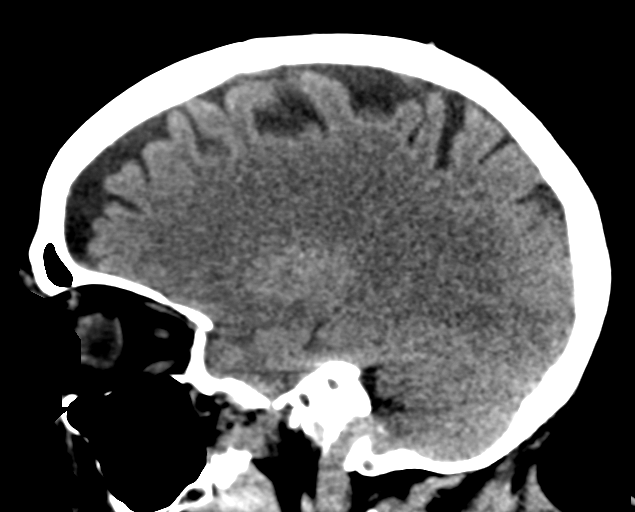
[im 27/53  brain]
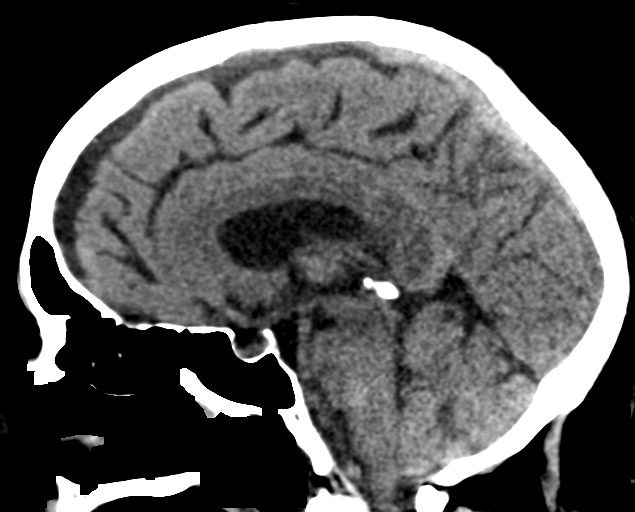
[im 35/53  brain]
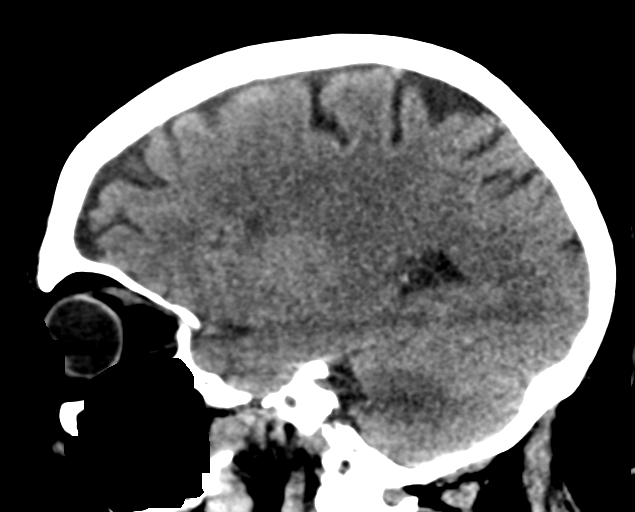

[17 of 47 positions shown; findings below may reference images not displayed]

FINDINGS: Brain: No subdural, epidural, or subarachnoid hemorrhage.
Cerebellum, brainstem, and basal cisterns are normal. Ventricles and
sulci are unremarkable. Scattered white matter changes. No acute
cortical ischemia or infarct. No midline shift.

Vascular: No hyperdense vessel or unexpected calcification.

Skull: Normal. Negative for fracture or focal lesion.

Sinuses/Orbits: No acute finding.

Other: None.
IMPRESSION: Scattered white matter changes. No acute intracranial abnormalities
identified.

## 2019-07-05 ENCOUNTER — Ambulatory Visit: Payer: PPO | Admitting: Cardiology

## 2019-07-09 DIAGNOSIS — J209 Acute bronchitis, unspecified: Secondary | ICD-10-CM | POA: Diagnosis not present

## 2019-07-09 DIAGNOSIS — Z20822 Contact with and (suspected) exposure to covid-19: Secondary | ICD-10-CM | POA: Diagnosis not present

## 2019-07-09 DIAGNOSIS — R05 Cough: Secondary | ICD-10-CM | POA: Diagnosis not present

## 2019-07-09 DIAGNOSIS — L299 Pruritus, unspecified: Secondary | ICD-10-CM | POA: Diagnosis not present

## 2019-07-09 DIAGNOSIS — L300 Nummular dermatitis: Secondary | ICD-10-CM | POA: Diagnosis not present

## 2019-07-09 DIAGNOSIS — L820 Inflamed seborrheic keratosis: Secondary | ICD-10-CM | POA: Diagnosis not present

## 2019-08-03 ENCOUNTER — Other Ambulatory Visit: Payer: Self-pay

## 2019-08-03 ENCOUNTER — Telehealth: Payer: Self-pay | Admitting: Cardiology

## 2019-08-03 ENCOUNTER — Ambulatory Visit: Payer: PPO | Admitting: Cardiology

## 2019-08-03 ENCOUNTER — Encounter: Payer: Self-pay | Admitting: Cardiology

## 2019-08-03 VITALS — BP 126/60 | HR 66 | Ht 63.0 in | Wt 263.0 lb

## 2019-08-03 DIAGNOSIS — I2510 Atherosclerotic heart disease of native coronary artery without angina pectoris: Secondary | ICD-10-CM

## 2019-08-03 DIAGNOSIS — J431 Panlobular emphysema: Secondary | ICD-10-CM

## 2019-08-03 DIAGNOSIS — R0602 Shortness of breath: Secondary | ICD-10-CM | POA: Diagnosis not present

## 2019-08-03 DIAGNOSIS — E782 Mixed hyperlipidemia: Secondary | ICD-10-CM

## 2019-08-03 DIAGNOSIS — I5032 Chronic diastolic (congestive) heart failure: Secondary | ICD-10-CM | POA: Diagnosis not present

## 2019-08-03 DIAGNOSIS — R918 Other nonspecific abnormal finding of lung field: Secondary | ICD-10-CM | POA: Diagnosis not present

## 2019-08-03 DIAGNOSIS — R05 Cough: Secondary | ICD-10-CM | POA: Diagnosis not present

## 2019-08-03 NOTE — Telephone Encounter (Signed)
Order was faxed.

## 2019-08-03 NOTE — Patient Instructions (Signed)
Medication Instructions:  Your physician recommends that you continue on your current medications as directed. Please refer to the Current Medication list given to you today.  *If you need a refill on your cardiac medications before your next appointment, please call your pharmacy*   Lab Work: Your physician recommends that you return for lab work today: bmp, cbc w/ diff, pro bnp  If you have labs (blood work) drawn today and your tests are completely normal, you will receive your results only by: Marland Kitchen MyChart Message (if you have MyChart) OR . A paper copy in the mail If you have any lab test that is abnormal or we need to change your treatment, we will call you to review the results.   Testing/Procedures: Your physician has requested that you have an echocardiogram. Echocardiography is a painless test that uses sound waves to create images of your heart. It provides your doctor with information about the size and shape of your heart and how well your heart's chambers and valves are working. This procedure takes approximately one hour. There are no restrictions for this procedure.  A chest x-ray takes a picture of the organs and structures inside the chest, including the heart, lungs, and blood vessels. This test can show several things, including, whether the heart is enlarges; whether fluid is building up in the lungs; and whether pacemaker / defibrillator leads are still in place.     Follow-Up: At [facility], you and your health needs are our priority.  As part of our continuing mission to provide you with exceptional heart care, we have created designated Provider Care Teams.  These Care Teams include your primary Cardiologist (physician) and Advanced Practice Providers (APPs -  Physician Assistants and Nurse Practitioners) who all work together to provide you with the care you need, when you need it.  We recommend signing up for the patient portal called "MyChart".  Sign up  information is provided on this After Visit Summary.  MyChart is used to connect with patients for Virtual Visits (Telemedicine).  Patients are able to view lab/test results, encounter notes, upcoming appointments, etc.  Non-urgent messages can be sent to your provider as well.   To learn more about what you can do with MyChart, go to [email or web address].    Your next appointment:   2 month(s)  The format for your next appointment:   In Person  Provider:   [name], MD   Other Instructions   Echocardiogram An echocardiogram is a procedure that uses painless sound waves (ultrasound) to produce an image of the heart. Images from an echocardiogram can provide important information about:  Signs of coronary artery disease (CAD).  Aneurysm detection. An aneurysm is a weak or damaged part of an artery wall that bulges out from the normal force of blood pumping through the body.  Heart size and shape. Changes in the size or shape of the heart can be associated with certain conditions, including heart failure, aneurysm, and CAD.  Heart muscle function.  Heart valve function.  Signs of a past heart attack.  Fluid buildup around the heart.  Thickening of the heart muscle.  A tumor or infectious growth around the heart valves. Tell a health care provider about:  Any allergies you have.  All medicines you are taking, including vitamins, herbs, eye drops, creams, and over-the-counter medicines.  Any blood disorders you have.  Any surgeries you have had.  Any medical conditions you have.  Whether you are pregnant or may  be pregnant. What are the risks? Generally, this is a safe procedure. However, problems may occur, including:  Allergic reaction to dye (contrast) that may be used during the procedure. What happens before the procedure? No specific preparation is needed. You may eat and drink normally. What happens during the procedure?   An IV tube may be  inserted into one of your veins.  You may receive contrast through this tube. A contrast is an injection that improves the quality of the pictures from your heart.  A gel will be applied to your chest.  A wand-like tool (transducer) will be moved over your chest. The gel will help to transmit the sound waves from the transducer.  The sound waves will harmlessly bounce off of your heart to allow the heart images to be captured in real-time motion. The images will be recorded on a computer. The procedure may vary among health care providers and hospitals. What happens after the procedure?  You may return to your normal, everyday life, including diet, activities, and medicines, unless your health care provider tells you not to do that. Summary  An echocardiogram is a procedure that uses painless sound waves (ultrasound) to produce an image of the heart.  Images from an echocardiogram can provide important information about the size and shape of your heart, heart muscle function, heart valve function, and fluid buildup around your heart.  You do not need to do anything to prepare before this procedure. You may eat and drink normally.  After the echocardiogram is completed, you may return to your normal, everyday life, unless your health care provider tells you not to do that. This information is not intended to replace advice given to you by your health care provider. Make sure you discuss any questions you have with your health care provider. Document Revised: 07/08/2018 Document Reviewed: 04/19/2016 Elsevier Patient Education  [place].

## 2019-08-03 NOTE — Progress Notes (Signed)
Cardiology Office Note:    Date:  08/03/2019   ID:  Danielle [name], DOB November 09, 1933, MRN [id number]  PCP:  [name], MD  Cardiologist:  [name], MD    Referring MD: [name], MD   No chief complaint on file. Short of breath  History of Present Illness:    Danielle [name] is a 86 y.o. female complex past medical history hypertension, diabetes, morbid multiple pains in January she suffers from COVID-19 infection.  Her husband also got sick with the same he actually passed.  Since that time she has been having cough shortness of breath and some sputum production which is about a month ago she had being seen in urgent care she was given antibiotic and steroids with minimal improvement.  Denies have any chest pain tightness squeezing pressure burning chest there is no swelling of lower extremities previously she is devastated by the loss of her husband of more than 60 years.  Denies have any fever but does have cough with clear sputum.  Past Medical History:  Diagnosis Date  . Anginal pain ([place])   . Arthritis   . Asthma   . CHF (congestive heart failure) ([place])   . COPD (chronic obstructive pulmonary disease) ([place])   . Coronary artery disease   . Depression   . Emphysema lung ([place])   . GERD (gastroesophageal reflux disease)   . Headache   . History of hiatal hernia   . Hyperlipidemia   . Hypertension   . Insomnia   . Osteoporosis   . Pneumonia 2013  . PONV (postoperative nausea and vomiting)   . Shortness of breath dyspnea    with exertion    Past Surgical History:  Procedure Laterality Date  . ABDOMINAL HYSTERECTOMY    . APPENDECTOMY    . BREAST SURGERY  40 yrs. ago   growth removed in each breast-benign  . CARDIAC CATHETERIZATION     12/14/12 LHC ([facility]): few mild stenotic areas max 40% of ectatic RCA o/w NL coronaries, EF 65%. Med tx.  Marland Kitchen CATARACT EXTRACTION W/ INTRAOCULAR LENS  IMPLANT, BILATERAL Bilateral 15 yrs ago  . CHOLECYSTECTOMY    .  COLONOSCOPY  04/22/2013   Diverticulosis in the sigmoid colon. Non bleeding internal hemorrhoids. Normal mucosa vascular pattern in the entire examined colon. Three 6 mm polyps n the descending colon. Resected and retrieved.   Marland Kitchen FOOT SURGERY    . LAPAROSCOPIC APPENDECTOMY N/A 02/19/2015   Procedure: EXCISION OF MESENTERIC MASS;  Surgeon: Stark Klein, MD;  Location: [place];  Service: General;  Laterality: N/A;  . REPLACEMENT TOTAL KNEE BILATERAL      Current Medications: Current Meds  Medication Sig  . albuterol (PROVENTIL HFA;VENTOLIN HFA) 108 (90 BASE) MCG/ACT inhaler Inhale 2 puffs into the lungs every 6 (six) hours as needed for wheezing or shortness of breath.   Marland Kitchen aspirin 81 MG tablet Take 81 mg by mouth daily.  . budesonide-formoterol (SYMBICORT) 160-4.5 MCG/ACT inhaler Inhale 2 puffs into the lungs 2 (two) times daily.  . citalopram (CELEXA) 20 MG tablet Take 20 mg by mouth daily.  . clonazePAM (KLONOPIN) 1 MG tablet Take 1 mg by mouth as needed (sleep).   . diclofenac sodium (VOLTAREN) 1 % GEL APPLY 2 GRAMS TOPICALLY THREE TIMES DAILY  . diclofenac Sodium (VOLTAREN) 1 % GEL APPLY 2 GRAMS  TOPICALLY THREE TIMES DAILY  . furosemide (LASIX) 20 MG tablet Take 40 mg by mouth daily.  Marland Kitchen glimepiride (AMARYL) 4 MG  tablet Take 4 mg by mouth daily.  Marland Kitchen JANUVIA 25 MG tablet 1 tablet daily.  . metFORMIN (GLUCOPHAGE-XR) 500 MG 24 hr tablet Take 500 mg by mouth daily.  . metoprolol succinate (TOPROL-XL) 25 MG 24 hr tablet Take 25 mg by mouth daily.  . nitroGLYCERIN (NITROSTAT) 0.4 MG SL tablet Place 1 tablet (0.4 mg total) under the tongue every 5 (five) minutes as needed for chest pain.  . pantoprazole (PROTONIX) 40 MG tablet Take 40 mg by mouth 2 (two) times daily.  . potassium chloride (MICRO-K) 10 MEQ CR capsule Take 10 mEq by mouth daily.  Marland Kitchen rOPINIRole (REQUIP) 0.25 MG tablet Take 0.25 mg by mouth at bedtime.  . rosuvastatin (CRESTOR) 40 MG tablet Take 40 mg by mouth daily.  . Vitamin D,  Ergocalciferol, (DRISDOL) 50000 UNITS CAPS capsule Take 50,000 Units by mouth every Sunday. No specific days     Allergies:   Codeine, Levofloxacin, Penicillins, and Tape   Social History   Socioeconomic History  . Marital status: Married    Spouse name: Not on file  . Number of children: Not on file  . Years of education: Not on file  . Highest education level: Not on file  Occupational History  . Not on file  Tobacco Use  . Smoking status: Former Smoker    Packs/day: 0.25    Types: Cigarettes    Quit date: 08/21/2016    Years since quitting: 2.9  . Smokeless tobacco: Never Used  Substance and Sexual Activity  . Alcohol use: No  . Drug use: No  . Sexual activity: Not on file  Other Topics Concern  . Not on file  Social History Narrative  . Not on file   Social Determinants of Health   Financial Resource Strain:   . Difficulty of Paying Living Expenses:   Food Insecurity:   . Worried About Charity fundraiser in the Last Year:   . Arboriculturist in the Last Year:   Transportation Needs:   . Film/video editor (Medical):   Marland Kitchen Lack of Transportation (Non-Medical):   Physical Activity:   . Days of Exercise per Week:   . Minutes of Exercise per Session:   Stress:   . Feeling of Stress :   Social Connections:   . Frequency of Communication with Friends and Family:   . Frequency of Social Gatherings with Friends and Family:   . Attends Religious Services:   . Active Member of Clubs or Organizations:   . Attends Archivist Meetings:   Marland Kitchen Marital Status:      Family History: The patient's Family history is unknown by patient. ROS:   Please see the history of present illness.    All 14 point review of systems negative except as described per history of present illness  EKGs/Labs/Other Studies Reviewed:      Recent Labs: 09/07/2018: TSH 1.860 10/30/2018: ALT 14; Hemoglobin 10.1; Magnesium 1.7; Platelets 210 11/02/2018: BUN 17; Creatinine, Ser 0.76;  NT-Pro BNP 268; Potassium 4.4; Sodium 138  Recent Lipid Panel    Component Value Date/Time   CHOL 134 10/22/2018 0952   TRIG 135 10/22/2018 0952   HDL 40 10/22/2018 0952   CHOLHDL 3.4 10/22/2018 0952   LDLCALC 67 10/22/2018 0952    Physical Exam:    VS:  BP 126/60   Pulse 66   Ht 5\' 3"  (1.6 m)   Wt 263 lb (119.3 kg)   SpO2 96%   BMI  46.59 kg/m     Wt Readings from Last 3 Encounters:  08/03/19 263 lb (119.3 kg)  02/07/19 267 lb (121.1 kg)  01/18/19 260 lb (117.9 kg)     GEN:  Well nourished, well developed in no acute distress HEENT: Normal NECK: No JVD; No carotid bruits LYMPHATICS: No lymphadenopathy CARDIAC: RRR, no murmurs, no rubs, no gallops RESPIRATORY:  Clear to auscultation without rales, bilateral rhonchi ABDOMEN: Soft, non-tender, non-distended MUSCULOSKELETAL:  No edema; No deformity  SKIN: Warm and dry LOWER EXTREMITIES: no swelling NEUROLOGIC:  Alert and oriented x 3 PSYCHIATRIC:  Normal affect   ASSESSMENT:    1. Chronic diastolic congestive heart failure ([place])   2. Coronary artery disease involving native coronary artery of native heart without angina pectoris   3. Panlobular emphysema ([place])   4. Mixed hyperlipidemia   5. Morbid obesity ([place])    PLAN:    In order of problems listed above:  1. Chronic diastolic congestive heart failure.  I will check a proBNP today.  I will also check CBC with differential.  Chest x-ray will be done as well to make sure she does not have any pneumonia.  I will send prescription for incentive spirometer as well as fluttering device. 2. Coronary disease stable from that point review.  We will continue present management. 3. Emphysema: Contributing to the present problem.  I will schedule her to have echocardiogram to assess left ventricle ejection fraction after coughing. 4. Mixed dyslipidemia: She is taking Crestor 40 which I will continue.  In the future we will check her fasting troponin.  K PN however last #is  from summer 2020 showing LDL of 67. 5. Obesity encompassing problem   Medication Adjustments/Labs and Tests Ordered: Current medicines are reviewed at length with the patient today.  Concerns regarding medicines are outlined above.  No orders of the defined types were placed in this encounter.  Medication changes: No orders of the defined types were placed in this encounter.   Signed, Park Liter, MD, [facility] 08/03/2019 2:37 PM    [place]

## 2019-08-03 NOTE — Telephone Encounter (Signed)
Lattie Haw from [facility] would like for Dr. Agustin Cree to fax an order for this patient to get an Incentive Spirometer. You can fax to [phone]

## 2019-08-03 NOTE — Telephone Encounter (Signed)
The daughter needs a Chiropodist

## 2019-08-03 NOTE — Addendum Note (Signed)
Addended by: Ashok Norris on: 08/03/2019 03:01 PM   Modules accepted: Orders

## 2019-08-03 NOTE — Telephone Encounter (Signed)
Danielle [name], Daughter of the patient called. The patient was told by Dr. Agustin Cree to get a device for her breathing. The patient could not get one from her Pharmacy, but the patient is at the Hospital now getting another test done. The Hospital said that they could give her a device if Dr. Raliegh Ip would fax the orders.  The order can be faxed to [phone]

## 2019-08-04 ENCOUNTER — Telehealth: Payer: Self-pay | Admitting: Cardiology

## 2019-08-04 LAB — CBC WITH DIFFERENTIAL/PLATELET
Basophils Absolute: 0.1 10*3/uL (ref 0.0–0.2)
Basos: 1 %
EOS (ABSOLUTE): 0.2 10*3/uL (ref 0.0–0.4)
Eos: 2 %
Hematocrit: 38.2 % (ref 34.0–46.6)
Hemoglobin: 12.4 g/dL (ref 11.1–15.9)
Immature Grans (Abs): 0 10*3/uL (ref 0.0–0.1)
Immature Granulocytes: 0 %
Lymphocytes Absolute: 2.4 10*3/uL (ref 0.7–3.1)
Lymphs: 25 %
MCH: 29 pg (ref 26.6–33.0)
MCHC: 32.5 g/dL (ref 31.5–35.7)
MCV: 90 fL (ref 79–97)
Monocytes Absolute: 0.7 10*3/uL (ref 0.1–0.9)
Monocytes: 7 %
Neutrophils Absolute: 6.2 10*3/uL (ref 1.4–7.0)
Neutrophils: 65 %
Platelets: 212 10*3/uL (ref 150–450)
RBC: 4.27 x10E6/uL (ref 3.77–5.28)
RDW: 13.2 % (ref 11.7–15.4)
WBC: 9.7 10*3/uL (ref 3.4–10.8)

## 2019-08-04 LAB — BASIC METABOLIC PANEL
BUN/Creatinine Ratio: 25 (ref 12–28)
BUN: 23 mg/dL (ref 8–27)
CO2: 24 mmol/L (ref 20–29)
Calcium: 9.4 mg/dL (ref 8.7–10.3)
Chloride: 101 mmol/L (ref 96–106)
Creatinine, Ser: 0.93 mg/dL (ref 0.57–1.00)
GFR calc Af Amer: 65 mL/min/{1.73_m2} (ref >59)
GFR calc non Af Amer: 57 mL/min/{1.73_m2} — ABNORMAL LOW (ref >59)
Glucose: 118 mg/dL — ABNORMAL HIGH (ref 65–99)
Potassium: 4.1 mmol/L (ref 3.5–5.2)
Sodium: 139 mmol/L (ref 134–144)

## 2019-08-04 LAB — PRO B NATRIURETIC PEPTIDE: NT-Pro BNP: 204 pg/mL (ref 0–738)

## 2019-08-04 NOTE — Telephone Encounter (Signed)
New Message    Pts daughter is calling and would like for the nurse to call her with the pts xray results     Please call

## 2019-08-04 NOTE — Telephone Encounter (Signed)
Called and spoke to patient's daughter per dpr. Advised her per Dr. Agustin Cree that the chest xray was normal as well as labs. She verbally understood.

## 2019-08-04 NOTE — Telephone Encounter (Signed)
Pts daughter advised that Dr. Agustin Cree has not had a chance to review the lab results... she also had CXR at [facility].. will forward to his nurse for review and will call her back with results as soon as they are fully available.

## 2019-08-05 ENCOUNTER — Other Ambulatory Visit (INDEPENDENT_AMBULATORY_CARE_PROVIDER_SITE_OTHER): Payer: Self-pay | Admitting: Orthopedic Surgery

## 2019-08-06 DIAGNOSIS — J439 Emphysema, unspecified: Secondary | ICD-10-CM | POA: Diagnosis not present

## 2019-08-06 DIAGNOSIS — I509 Heart failure, unspecified: Secondary | ICD-10-CM | POA: Diagnosis not present

## 2019-08-06 DIAGNOSIS — J4520 Mild intermittent asthma, uncomplicated: Secondary | ICD-10-CM | POA: Diagnosis not present

## 2019-08-11 DIAGNOSIS — J431 Panlobular emphysema: Secondary | ICD-10-CM | POA: Diagnosis not present

## 2019-08-11 DIAGNOSIS — G4733 Obstructive sleep apnea (adult) (pediatric): Secondary | ICD-10-CM | POA: Diagnosis not present

## 2019-08-11 DIAGNOSIS — J9611 Chronic respiratory failure with hypoxia: Secondary | ICD-10-CM | POA: Diagnosis not present

## 2019-08-15 ENCOUNTER — Telehealth: Payer: Self-pay | Admitting: Cardiology

## 2019-08-15 NOTE — Telephone Encounter (Signed)
New message  Patient's daughter is calling in to see if it is necessary for patient to attend the echocardiogram appointment on 08/22/19. States that patient had an echo done on 10/22/18. Please call and advise.

## 2019-08-16 NOTE — Telephone Encounter (Signed)
Yes, I know that she did have echocardiogram done a year ago but it would be beneficial to get echocardiogram of course is always up to her.

## 2019-08-17 NOTE — Telephone Encounter (Signed)
Spoke to patients daughter just now and let her know that Dr. Agustin Cree was still recommending that she get the echo done on 08/22/19. She verbalizes understanding of this and does not voice any other issues or concerns. She states that they will see [place] on the 24th.    Encouraged patient to call back with any questions or concerns.

## 2019-08-22 ENCOUNTER — Other Ambulatory Visit: Payer: Self-pay

## 2019-08-22 ENCOUNTER — Ambulatory Visit (INDEPENDENT_AMBULATORY_CARE_PROVIDER_SITE_OTHER): Payer: PPO

## 2019-08-22 DIAGNOSIS — I5032 Chronic diastolic (congestive) heart failure: Secondary | ICD-10-CM

## 2019-08-22 NOTE — Progress Notes (Signed)
Complete echocardiogram has been performed.  Jimmy Richrd Kuzniar RDCS, RVT 

## 2019-08-26 ENCOUNTER — Telehealth: Payer: Self-pay | Admitting: Cardiology

## 2019-08-26 NOTE — Telephone Encounter (Signed)
New Message  Pts daughter is calling to obtain results on echocardiogram.

## 2019-08-31 ENCOUNTER — Telehealth: Payer: Self-pay | Admitting: Cardiology

## 2019-08-31 NOTE — Telephone Encounter (Signed)
Called patient back informed her of results.

## 2019-08-31 NOTE — Telephone Encounter (Signed)
Patient's daughter, Janace Hoard, called to follow up, requesting to speak with [facility] regarding the patient's echocardiogram results. Call successfully transferred.

## 2019-09-05 DIAGNOSIS — E1165 Type 2 diabetes mellitus with hyperglycemia: Secondary | ICD-10-CM | POA: Diagnosis not present

## 2019-09-05 DIAGNOSIS — Z2821 Immunization not carried out because of patient refusal: Secondary | ICD-10-CM | POA: Diagnosis not present

## 2019-09-05 DIAGNOSIS — D72829 Elevated white blood cell count, unspecified: Secondary | ICD-10-CM | POA: Diagnosis not present

## 2019-09-05 DIAGNOSIS — Z7984 Long term (current) use of oral hypoglycemic drugs: Secondary | ICD-10-CM | POA: Diagnosis not present

## 2019-09-05 DIAGNOSIS — J441 Chronic obstructive pulmonary disease with (acute) exacerbation: Secondary | ICD-10-CM | POA: Diagnosis not present

## 2019-09-05 DIAGNOSIS — R8290 Unspecified abnormal findings in urine: Secondary | ICD-10-CM | POA: Diagnosis not present

## 2019-09-05 DIAGNOSIS — K219 Gastro-esophageal reflux disease without esophagitis: Secondary | ICD-10-CM | POA: Diagnosis not present

## 2019-09-05 DIAGNOSIS — J9601 Acute respiratory failure with hypoxia: Secondary | ICD-10-CM | POA: Diagnosis not present

## 2019-09-05 DIAGNOSIS — E119 Type 2 diabetes mellitus without complications: Secondary | ICD-10-CM | POA: Diagnosis not present

## 2019-09-05 DIAGNOSIS — E1169 Type 2 diabetes mellitus with other specified complication: Secondary | ICD-10-CM | POA: Diagnosis not present

## 2019-09-05 DIAGNOSIS — R531 Weakness: Secondary | ICD-10-CM | POA: Diagnosis not present

## 2019-09-05 DIAGNOSIS — I110 Hypertensive heart disease with heart failure: Secondary | ICD-10-CM | POA: Diagnosis not present

## 2019-09-05 DIAGNOSIS — N390 Urinary tract infection, site not specified: Secondary | ICD-10-CM | POA: Diagnosis not present

## 2019-09-05 DIAGNOSIS — E669 Obesity, unspecified: Secondary | ICD-10-CM | POA: Diagnosis not present

## 2019-09-05 DIAGNOSIS — R5381 Other malaise: Secondary | ICD-10-CM | POA: Diagnosis not present

## 2019-09-05 DIAGNOSIS — E785 Hyperlipidemia, unspecified: Secondary | ICD-10-CM | POA: Diagnosis not present

## 2019-09-05 DIAGNOSIS — R0602 Shortness of breath: Secondary | ICD-10-CM | POA: Diagnosis not present

## 2019-09-05 DIAGNOSIS — I5032 Chronic diastolic (congestive) heart failure: Secondary | ICD-10-CM | POA: Diagnosis not present

## 2019-09-05 DIAGNOSIS — I10 Essential (primary) hypertension: Secondary | ICD-10-CM | POA: Diagnosis not present

## 2019-09-05 DIAGNOSIS — M069 Rheumatoid arthritis, unspecified: Secondary | ICD-10-CM | POA: Diagnosis not present

## 2019-09-05 DIAGNOSIS — Z79899 Other long term (current) drug therapy: Secondary | ICD-10-CM | POA: Diagnosis not present

## 2019-09-05 DIAGNOSIS — Z9981 Dependence on supplemental oxygen: Secondary | ICD-10-CM | POA: Diagnosis not present

## 2019-09-05 DIAGNOSIS — Z9181 History of falling: Secondary | ICD-10-CM | POA: Diagnosis not present

## 2019-09-05 DIAGNOSIS — J181 Lobar pneumonia, unspecified organism: Secondary | ICD-10-CM | POA: Diagnosis not present

## 2019-09-05 DIAGNOSIS — R069 Unspecified abnormalities of breathing: Secondary | ICD-10-CM | POA: Diagnosis not present

## 2019-09-05 DIAGNOSIS — Z87891 Personal history of nicotine dependence: Secondary | ICD-10-CM | POA: Diagnosis not present

## 2019-09-05 DIAGNOSIS — J189 Pneumonia, unspecified organism: Secondary | ICD-10-CM | POA: Diagnosis not present

## 2019-09-05 DIAGNOSIS — Z7982 Long term (current) use of aspirin: Secondary | ICD-10-CM | POA: Diagnosis not present

## 2019-09-05 DIAGNOSIS — R0603 Acute respiratory distress: Secondary | ICD-10-CM | POA: Diagnosis not present

## 2019-09-09 DIAGNOSIS — Z9981 Dependence on supplemental oxygen: Secondary | ICD-10-CM | POA: Diagnosis not present

## 2019-09-09 DIAGNOSIS — Z9181 History of falling: Secondary | ICD-10-CM | POA: Diagnosis not present

## 2019-09-09 DIAGNOSIS — Z87891 Personal history of nicotine dependence: Secondary | ICD-10-CM | POA: Diagnosis not present

## 2019-09-09 DIAGNOSIS — Z87440 Personal history of urinary (tract) infections: Secondary | ICD-10-CM | POA: Diagnosis not present

## 2019-09-09 DIAGNOSIS — I509 Heart failure, unspecified: Secondary | ICD-10-CM | POA: Diagnosis not present

## 2019-09-09 DIAGNOSIS — M069 Rheumatoid arthritis, unspecified: Secondary | ICD-10-CM | POA: Diagnosis not present

## 2019-09-09 DIAGNOSIS — I110 Hypertensive heart disease with heart failure: Secondary | ICD-10-CM | POA: Diagnosis not present

## 2019-09-09 DIAGNOSIS — E1169 Type 2 diabetes mellitus with other specified complication: Secondary | ICD-10-CM | POA: Diagnosis not present

## 2019-09-09 DIAGNOSIS — J181 Lobar pneumonia, unspecified organism: Secondary | ICD-10-CM | POA: Diagnosis not present

## 2019-09-09 DIAGNOSIS — J4520 Mild intermittent asthma, uncomplicated: Secondary | ICD-10-CM | POA: Diagnosis not present

## 2019-09-09 DIAGNOSIS — I5032 Chronic diastolic (congestive) heart failure: Secondary | ICD-10-CM | POA: Diagnosis not present

## 2019-09-09 DIAGNOSIS — E7800 Pure hypercholesterolemia, unspecified: Secondary | ICD-10-CM | POA: Diagnosis not present

## 2019-09-09 DIAGNOSIS — J449 Chronic obstructive pulmonary disease, unspecified: Secondary | ICD-10-CM | POA: Diagnosis not present

## 2019-09-09 DIAGNOSIS — K219 Gastro-esophageal reflux disease without esophagitis: Secondary | ICD-10-CM | POA: Diagnosis not present

## 2019-09-09 DIAGNOSIS — Z7982 Long term (current) use of aspirin: Secondary | ICD-10-CM | POA: Diagnosis not present

## 2019-09-09 DIAGNOSIS — Z7984 Long term (current) use of oral hypoglycemic drugs: Secondary | ICD-10-CM | POA: Diagnosis not present

## 2019-09-12 ENCOUNTER — Other Ambulatory Visit (INDEPENDENT_AMBULATORY_CARE_PROVIDER_SITE_OTHER): Payer: Self-pay | Admitting: Orthopedic Surgery

## 2019-09-19 DIAGNOSIS — E782 Mixed hyperlipidemia: Secondary | ICD-10-CM | POA: Diagnosis not present

## 2019-09-19 DIAGNOSIS — D509 Iron deficiency anemia, unspecified: Secondary | ICD-10-CM | POA: Diagnosis not present

## 2019-09-19 DIAGNOSIS — E538 Deficiency of other specified B group vitamins: Secondary | ICD-10-CM | POA: Diagnosis not present

## 2019-09-19 DIAGNOSIS — K219 Gastro-esophageal reflux disease without esophagitis: Secondary | ICD-10-CM | POA: Diagnosis not present

## 2019-09-19 DIAGNOSIS — N1831 Chronic kidney disease, stage 3a: Secondary | ICD-10-CM | POA: Diagnosis not present

## 2019-09-19 DIAGNOSIS — R269 Unspecified abnormalities of gait and mobility: Secondary | ICD-10-CM | POA: Diagnosis not present

## 2019-09-19 DIAGNOSIS — I5032 Chronic diastolic (congestive) heart failure: Secondary | ICD-10-CM | POA: Diagnosis not present

## 2019-09-19 DIAGNOSIS — J9611 Chronic respiratory failure with hypoxia: Secondary | ICD-10-CM | POA: Diagnosis not present

## 2019-09-19 DIAGNOSIS — R5381 Other malaise: Secondary | ICD-10-CM | POA: Diagnosis not present

## 2019-09-19 DIAGNOSIS — F5104 Psychophysiologic insomnia: Secondary | ICD-10-CM | POA: Diagnosis not present

## 2019-09-19 DIAGNOSIS — R5383 Other fatigue: Secondary | ICD-10-CM | POA: Diagnosis not present

## 2019-09-19 DIAGNOSIS — E1122 Type 2 diabetes mellitus with diabetic chronic kidney disease: Secondary | ICD-10-CM | POA: Diagnosis not present

## 2019-09-19 DIAGNOSIS — F330 Major depressive disorder, recurrent, mild: Secondary | ICD-10-CM | POA: Diagnosis not present

## 2019-09-23 DIAGNOSIS — I110 Hypertensive heart disease with heart failure: Secondary | ICD-10-CM | POA: Diagnosis not present

## 2019-09-23 DIAGNOSIS — E1169 Type 2 diabetes mellitus with other specified complication: Secondary | ICD-10-CM | POA: Diagnosis not present

## 2019-09-23 DIAGNOSIS — J181 Lobar pneumonia, unspecified organism: Secondary | ICD-10-CM | POA: Diagnosis not present

## 2019-09-23 DIAGNOSIS — I5032 Chronic diastolic (congestive) heart failure: Secondary | ICD-10-CM | POA: Diagnosis not present

## 2019-10-06 DIAGNOSIS — J4520 Mild intermittent asthma, uncomplicated: Secondary | ICD-10-CM | POA: Diagnosis not present

## 2019-10-06 DIAGNOSIS — I509 Heart failure, unspecified: Secondary | ICD-10-CM | POA: Diagnosis not present

## 2019-10-06 DIAGNOSIS — J439 Emphysema, unspecified: Secondary | ICD-10-CM | POA: Diagnosis not present

## 2019-10-08 DIAGNOSIS — J9601 Acute respiratory failure with hypoxia: Secondary | ICD-10-CM | POA: Diagnosis not present

## 2019-10-09 DIAGNOSIS — J449 Chronic obstructive pulmonary disease, unspecified: Secondary | ICD-10-CM | POA: Diagnosis not present

## 2019-10-09 DIAGNOSIS — Z87891 Personal history of nicotine dependence: Secondary | ICD-10-CM | POA: Diagnosis not present

## 2019-10-09 DIAGNOSIS — I509 Heart failure, unspecified: Secondary | ICD-10-CM | POA: Diagnosis not present

## 2019-10-09 DIAGNOSIS — I5032 Chronic diastolic (congestive) heart failure: Secondary | ICD-10-CM | POA: Diagnosis not present

## 2019-10-09 DIAGNOSIS — Z9181 History of falling: Secondary | ICD-10-CM | POA: Diagnosis not present

## 2019-10-09 DIAGNOSIS — Z7984 Long term (current) use of oral hypoglycemic drugs: Secondary | ICD-10-CM | POA: Diagnosis not present

## 2019-10-09 DIAGNOSIS — Z87440 Personal history of urinary (tract) infections: Secondary | ICD-10-CM | POA: Diagnosis not present

## 2019-10-09 DIAGNOSIS — E1169 Type 2 diabetes mellitus with other specified complication: Secondary | ICD-10-CM | POA: Diagnosis not present

## 2019-10-09 DIAGNOSIS — Z9981 Dependence on supplemental oxygen: Secondary | ICD-10-CM | POA: Diagnosis not present

## 2019-10-09 DIAGNOSIS — J4520 Mild intermittent asthma, uncomplicated: Secondary | ICD-10-CM | POA: Diagnosis not present

## 2019-10-09 DIAGNOSIS — Z7982 Long term (current) use of aspirin: Secondary | ICD-10-CM | POA: Diagnosis not present

## 2019-10-09 DIAGNOSIS — K219 Gastro-esophageal reflux disease without esophagitis: Secondary | ICD-10-CM | POA: Diagnosis not present

## 2019-10-09 DIAGNOSIS — I110 Hypertensive heart disease with heart failure: Secondary | ICD-10-CM | POA: Diagnosis not present

## 2019-10-09 DIAGNOSIS — E7800 Pure hypercholesterolemia, unspecified: Secondary | ICD-10-CM | POA: Diagnosis not present

## 2019-10-09 DIAGNOSIS — J181 Lobar pneumonia, unspecified organism: Secondary | ICD-10-CM | POA: Diagnosis not present

## 2019-10-09 DIAGNOSIS — M069 Rheumatoid arthritis, unspecified: Secondary | ICD-10-CM | POA: Diagnosis not present

## 2019-10-11 ENCOUNTER — Encounter: Payer: Self-pay | Admitting: Cardiology

## 2019-10-11 ENCOUNTER — Other Ambulatory Visit: Payer: Self-pay

## 2019-10-11 ENCOUNTER — Ambulatory Visit: Payer: PPO | Admitting: Cardiology

## 2019-10-11 VITALS — BP 128/70 | HR 57 | Ht 63.0 in | Wt 261.0 lb

## 2019-10-11 DIAGNOSIS — J431 Panlobular emphysema: Secondary | ICD-10-CM

## 2019-10-11 DIAGNOSIS — M25561 Pain in right knee: Secondary | ICD-10-CM | POA: Diagnosis not present

## 2019-10-11 DIAGNOSIS — G8929 Other chronic pain: Secondary | ICD-10-CM

## 2019-10-11 DIAGNOSIS — I2510 Atherosclerotic heart disease of native coronary artery without angina pectoris: Secondary | ICD-10-CM

## 2019-10-11 DIAGNOSIS — M25562 Pain in left knee: Secondary | ICD-10-CM | POA: Diagnosis not present

## 2019-10-11 NOTE — Patient Instructions (Signed)
Medication Instructions:  Your physician recommends that you continue on your current medications as directed. Please refer to the Current Medication list given to you today.  *If you need a refill on your cardiac medications before your next appointment, please call your pharmacy*   Lab Work: None. If you have labs (blood work) drawn today and your tests are completely normal, you will receive your results only by: Marland Kitchen MyChart Message (if you have MyChart) OR . A paper copy in the mail If you have any lab test that is abnormal or we need to change your treatment, we will call you to review the results.   Testing/Procedures: Your physician has requested that you have a lower extremity arterial exercise duplex. During this test, exercise and ultrasound are used to evaluate arterial blood flow in the legs. Allow one hour for this exam. There are no restrictions or special instructions.    Follow-Up: At [facility], you and your health needs are our priority.  As part of our continuing mission to provide you with exceptional heart care, we have created designated Provider Care Teams.  These Care Teams include your primary Cardiologist (physician) and Advanced Practice Providers (APPs -  Physician Assistants and Nurse Practitioners) who all work together to provide you with the care you need, when you need it.  We recommend signing up for the patient portal called "MyChart".  Sign up information is provided on this After Visit Summary.  MyChart is used to connect with patients for Virtual Visits (Telemedicine).  Patients are able to view lab/test results, encounter notes, upcoming appointments, etc.  Non-urgent messages can be sent to your provider as well.   To learn more about what you can do with MyChart, go to [email or web address].    Your next appointment:   4 month(s)  The format for your next appointment:   In Person  Provider:   [name], MD   Other  Instructions '

## 2019-10-11 NOTE — Progress Notes (Signed)
Cardiology Office Note:    Date:  10/11/2019   ID:  Danielle [name], DOB 1933/09/08, MRN [id number]  PCP:  [name], MD  Cardiologist:  [name], MD    Referring MD: [name], MD   No chief complaint on file. I am having a lot of pain in my legs  History of Present Illness:    Danielle [name] is a 86 y.o. female with past medical history significant for coronary artery disease 40% stenosis of RCA and cardiac cath from 2014.  Morbid obesity, diastolic congestive heart failure, type 2 diabetes.  Comes today to my office after being in the hospital.  She was there because of pneumonia as well as urinary tract infection.  She has been back home for about 2 weeks.  Gradually recovering.  Biggest complaint right now appears to be pain in the lower extremities is below knee all the time worse at evening time.  Walking does not relieve the pain I suspect this is neuropathy.  Past Medical History:  Diagnosis Date  . Anginal pain ([place])   . Arthritis   . Asthma   . CHF (congestive heart failure) ([place])   . COPD (chronic obstructive pulmonary disease) ([place])   . Coronary artery disease   . Depression   . Emphysema lung ([place])   . GERD (gastroesophageal reflux disease)   . Headache   . History of hiatal hernia   . Hyperlipidemia   . Hypertension   . Insomnia   . Osteoporosis   . Pneumonia 2013  . PONV (postoperative nausea and vomiting)   . Shortness of breath dyspnea    with exertion    Past Surgical History:  Procedure Laterality Date  . ABDOMINAL HYSTERECTOMY    . APPENDECTOMY    . BREAST SURGERY  40 yrs. ago   growth removed in each breast-benign  . CARDIAC CATHETERIZATION     12/14/12 LHC ([facility]): few mild stenotic areas max 40% of ectatic RCA o/w NL coronaries, EF 65%. Med tx.  Marland Kitchen CATARACT EXTRACTION W/ INTRAOCULAR LENS  IMPLANT, BILATERAL Bilateral 15 yrs ago  . CHOLECYSTECTOMY    . COLONOSCOPY  04/22/2013   Diverticulosis in the sigmoid colon.  Non bleeding internal hemorrhoids. Normal mucosa vascular pattern in the entire examined colon. Three 6 mm polyps n the descending colon. Resected and retrieved.   Marland Kitchen FOOT SURGERY    . LAPAROSCOPIC APPENDECTOMY N/A 02/19/2015   Procedure: EXCISION OF MESENTERIC MASS;  Surgeon: Stark Klein, MD;  Location: [place];  Service: General;  Laterality: N/A;  . REPLACEMENT TOTAL KNEE BILATERAL      Current Medications: Current Meds  Medication Sig  . albuterol (PROVENTIL HFA;VENTOLIN HFA) 108 (90 BASE) MCG/ACT inhaler Inhale 2 puffs into the lungs every 6 (six) hours as needed for wheezing or shortness of breath.   Marland Kitchen aspirin 81 MG tablet Take 81 mg by mouth daily.  . budesonide-formoterol (SYMBICORT) 160-4.5 MCG/ACT inhaler Inhale 2 puffs into the lungs 2 (two) times daily.  . citalopram (CELEXA) 20 MG tablet Take 20 mg by mouth daily.  . clonazePAM (KLONOPIN) 1 MG tablet Take 1 mg by mouth as needed (sleep).   . diclofenac Sodium (VOLTAREN) 1 % GEL APPLY 2 GRAMS TOPICALLY TO AFFECTED AREA THREE TIMES DAILY  . furosemide (LASIX) 20 MG tablet Take 40 mg by mouth daily.  Marland Kitchen glimepiride (AMARYL) 4 MG tablet Take 4 mg by mouth daily.  Marland Kitchen JANUVIA 25 MG tablet 1 tablet daily.  Marland Kitchen  metFORMIN (GLUCOPHAGE-XR) 500 MG 24 hr tablet Take 500 mg by mouth daily.  . metoprolol succinate (TOPROL-XL) 25 MG 24 hr tablet Take 25 mg by mouth daily.  . nitroGLYCERIN (NITROSTAT) 0.4 MG SL tablet Place 1 tablet (0.4 mg total) under the tongue every 5 (five) minutes as needed for chest pain.  . pantoprazole (PROTONIX) 40 MG tablet Take 40 mg by mouth 2 (two) times daily.  . potassium chloride (MICRO-K) 10 MEQ CR capsule Take 10 mEq by mouth daily.  Marland Kitchen rOPINIRole (REQUIP) 0.25 MG tablet Take 0.25 mg by mouth at bedtime.  . rosuvastatin (CRESTOR) 40 MG tablet Take 40 mg by mouth daily.  . Vitamin D, Ergocalciferol, (DRISDOL) 50000 UNITS CAPS capsule Take 50,000 Units by mouth every Sunday. No specific days     Allergies:    Codeine, Levofloxacin, Penicillins, and Tape   Social History   Socioeconomic History  . Marital status: Married    Spouse name: Not on file  . Number of children: Not on file  . Years of education: Not on file  . Highest education level: Not on file  Occupational History  . Not on file  Tobacco Use  . Smoking status: Former Smoker    Packs/day: 0.25    Types: Cigarettes    Quit date: 08/21/2016    Years since quitting: 3.1  . Smokeless tobacco: Never Used  Vaping Use  . Vaping Use: Never used  Substance and Sexual Activity  . Alcohol use: No  . Drug use: No  . Sexual activity: Not on file  Other Topics Concern  . Not on file  Social History Narrative  . Not on file   Social Determinants of Health   Financial Resource Strain:   . Difficulty of Paying Living Expenses:   Food Insecurity:   . Worried About Charity fundraiser in the Last Year:   . Arboriculturist in the Last Year:   Transportation Needs:   . Film/video editor (Medical):   Marland Kitchen Lack of Transportation (Non-Medical):   Physical Activity:   . Days of Exercise per Week:   . Minutes of Exercise per Session:   Stress:   . Feeling of Stress :   Social Connections:   . Frequency of Communication with Friends and Family:   . Frequency of Social Gatherings with Friends and Family:   . Attends Religious Services:   . Active Member of Clubs or Organizations:   . Attends Archivist Meetings:   Marland Kitchen Marital Status:      Family History: The patient's Family history is unknown by patient. ROS:   Please see the history of present illness.    All 14 point review of systems negative except as described per history of present illness  EKGs/Labs/Other Studies Reviewed:      Recent Labs: 10/30/2018: ALT 14; Magnesium 1.7 08/03/2019: BUN 23; Creatinine, Ser 0.93; Hemoglobin 12.4; NT-Pro BNP 204; Platelets 212; Potassium 4.1; Sodium 139  Recent Lipid Panel    Component Value Date/Time   CHOL 134  10/22/2018 0952   TRIG 135 10/22/2018 0952   HDL 40 10/22/2018 0952   CHOLHDL 3.4 10/22/2018 0952   LDLCALC 67 10/22/2018 0952    Physical Exam:    VS:  BP 128/70 (BP Location: Left Wrist, Patient Position: Sitting, Cuff Size: Normal)   Pulse (!) 57   Ht 5\' 3"  (1.6 m)   Wt 261 lb (118.4 kg)   SpO2 95%   BMI 46.23  kg/m     Wt Readings from Last 3 Encounters:  10/11/19 261 lb (118.4 kg)  08/03/19 263 lb (119.3 kg)  02/07/19 267 lb (121.1 kg)     GEN:  Well nourished, well developed in no acute distress HEENT: Normal NECK: No JVD; No carotid bruits LYMPHATICS: No lymphadenopathy CARDIAC: RRR, no murmurs, no rubs, no gallops RESPIRATORY:  Clear to auscultation without rales, wheezing or rhonchi  ABDOMEN: Soft, non-tender, non-distended MUSCULOSKELETAL:  No edema; No deformity  SKIN: Warm and dry LOWER EXTREMITIES: no swelling NEUROLOGIC:  Alert and oriented x 3 PSYCHIATRIC:  Normal affect   ASSESSMENT:    1. Coronary artery disease involving native coronary artery of native heart without angina pectoris   2. Panlobular emphysema ([place])   3. Chronic pain of both knees    PLAN:    In order of problems listed above:  1. Coronary disease stable without any typical symptoms we will continue present management. 2. Emphysema which is chronic likely she said she does not smoke anymore.  Recent admission to the hospital because of bilateral pneumonia being reviewed. 3. Pain in lower extremities I suspect this is neuropathy and asked her to contact primary care physician to address this.  She also have difficulty sleeping therefore may be some of the medication will also help her from insomnia. 4. Dyslipidemia:   Medication Adjustments/Labs and Tests Ordered: Current medicines are reviewed at length with the patient today.  Concerns regarding medicines are outlined above.  No orders of the defined types were placed in this encounter.  Medication changes: No orders of the  defined types were placed in this encounter.   Signed, Park Liter, MD, [facility] 10/11/2019 2:24 PM    [place]

## 2019-11-02 ENCOUNTER — Other Ambulatory Visit: Payer: Self-pay | Admitting: Cardiology

## 2019-11-02 ENCOUNTER — Ambulatory Visit (INDEPENDENT_AMBULATORY_CARE_PROVIDER_SITE_OTHER): Payer: PPO

## 2019-11-02 ENCOUNTER — Other Ambulatory Visit: Payer: Self-pay

## 2019-11-02 DIAGNOSIS — G8929 Other chronic pain: Secondary | ICD-10-CM

## 2019-11-02 DIAGNOSIS — M25562 Pain in left knee: Secondary | ICD-10-CM

## 2019-11-02 DIAGNOSIS — M79662 Pain in left lower leg: Secondary | ICD-10-CM | POA: Diagnosis not present

## 2019-11-02 DIAGNOSIS — M79661 Pain in right lower leg: Secondary | ICD-10-CM

## 2019-11-02 DIAGNOSIS — M25561 Pain in right knee: Secondary | ICD-10-CM | POA: Diagnosis not present

## 2019-11-02 DIAGNOSIS — R0609 Other forms of dyspnea: Secondary | ICD-10-CM

## 2019-11-02 NOTE — Progress Notes (Signed)
Lower extremity arterial duplex exam performed.  Jimmy Jabari Swo[place], RDCS, RVT

## 2019-11-06 DIAGNOSIS — J4520 Mild intermittent asthma, uncomplicated: Secondary | ICD-10-CM | POA: Diagnosis not present

## 2019-11-06 DIAGNOSIS — J439 Emphysema, unspecified: Secondary | ICD-10-CM | POA: Diagnosis not present

## 2019-11-06 DIAGNOSIS — I509 Heart failure, unspecified: Secondary | ICD-10-CM | POA: Diagnosis not present

## 2019-11-08 DIAGNOSIS — J9601 Acute respiratory failure with hypoxia: Secondary | ICD-10-CM | POA: Diagnosis not present

## 2019-11-09 DIAGNOSIS — J4520 Mild intermittent asthma, uncomplicated: Secondary | ICD-10-CM | POA: Diagnosis not present

## 2019-11-09 DIAGNOSIS — I509 Heart failure, unspecified: Secondary | ICD-10-CM | POA: Diagnosis not present

## 2019-11-29 DIAGNOSIS — Z6841 Body Mass Index (BMI) 40.0 and over, adult: Secondary | ICD-10-CM | POA: Diagnosis not present

## 2019-11-29 DIAGNOSIS — E782 Mixed hyperlipidemia: Secondary | ICD-10-CM | POA: Diagnosis not present

## 2019-11-29 DIAGNOSIS — D692 Other nonthrombocytopenic purpura: Secondary | ICD-10-CM | POA: Diagnosis not present

## 2019-11-29 DIAGNOSIS — F330 Major depressive disorder, recurrent, mild: Secondary | ICD-10-CM | POA: Diagnosis not present

## 2019-11-29 DIAGNOSIS — D509 Iron deficiency anemia, unspecified: Secondary | ICD-10-CM | POA: Diagnosis not present

## 2019-11-29 DIAGNOSIS — R5383 Other fatigue: Secondary | ICD-10-CM | POA: Diagnosis not present

## 2019-11-29 DIAGNOSIS — E538 Deficiency of other specified B group vitamins: Secondary | ICD-10-CM | POA: Diagnosis not present

## 2019-11-29 DIAGNOSIS — I5032 Chronic diastolic (congestive) heart failure: Secondary | ICD-10-CM | POA: Diagnosis not present

## 2019-11-29 DIAGNOSIS — J9611 Chronic respiratory failure with hypoxia: Secondary | ICD-10-CM | POA: Diagnosis not present

## 2019-11-29 DIAGNOSIS — N1831 Chronic kidney disease, stage 3a: Secondary | ICD-10-CM | POA: Diagnosis not present

## 2019-11-29 DIAGNOSIS — E1122 Type 2 diabetes mellitus with diabetic chronic kidney disease: Secondary | ICD-10-CM | POA: Diagnosis not present

## 2019-11-29 DIAGNOSIS — R5381 Other malaise: Secondary | ICD-10-CM | POA: Diagnosis not present

## 2019-11-29 DIAGNOSIS — J431 Panlobular emphysema: Secondary | ICD-10-CM | POA: Diagnosis not present

## 2019-11-29 DIAGNOSIS — I87302 Chronic venous hypertension (idiopathic) without complications of left lower extremity: Secondary | ICD-10-CM | POA: Diagnosis not present

## 2019-11-29 DIAGNOSIS — Z0000 Encounter for general adult medical examination without abnormal findings: Secondary | ICD-10-CM | POA: Diagnosis not present

## 2019-12-06 ENCOUNTER — Other Ambulatory Visit: Payer: Self-pay | Admitting: Cardiology

## 2019-12-06 DIAGNOSIS — R0609 Other forms of dyspnea: Secondary | ICD-10-CM

## 2019-12-06 DIAGNOSIS — I5032 Chronic diastolic (congestive) heart failure: Secondary | ICD-10-CM

## 2019-12-07 DIAGNOSIS — J4520 Mild intermittent asthma, uncomplicated: Secondary | ICD-10-CM | POA: Diagnosis not present

## 2019-12-07 DIAGNOSIS — J439 Emphysema, unspecified: Secondary | ICD-10-CM | POA: Diagnosis not present

## 2019-12-07 DIAGNOSIS — I509 Heart failure, unspecified: Secondary | ICD-10-CM | POA: Diagnosis not present

## 2019-12-09 DIAGNOSIS — J9601 Acute respiratory failure with hypoxia: Secondary | ICD-10-CM | POA: Diagnosis not present

## 2019-12-10 DIAGNOSIS — I509 Heart failure, unspecified: Secondary | ICD-10-CM | POA: Diagnosis not present

## 2019-12-10 DIAGNOSIS — J4520 Mild intermittent asthma, uncomplicated: Secondary | ICD-10-CM | POA: Diagnosis not present

## 2019-12-16 DIAGNOSIS — J431 Panlobular emphysema: Secondary | ICD-10-CM | POA: Diagnosis not present

## 2019-12-16 DIAGNOSIS — J9611 Chronic respiratory failure with hypoxia: Secondary | ICD-10-CM | POA: Diagnosis not present

## 2019-12-16 DIAGNOSIS — I5032 Chronic diastolic (congestive) heart failure: Secondary | ICD-10-CM | POA: Diagnosis not present

## 2019-12-16 DIAGNOSIS — G4733 Obstructive sleep apnea (adult) (pediatric): Secondary | ICD-10-CM | POA: Insufficient documentation

## 2019-12-16 HISTORY — DX: Obstructive sleep apnea (adult) (pediatric): G47.33

## 2020-01-05 DIAGNOSIS — Z23 Encounter for immunization: Secondary | ICD-10-CM | POA: Diagnosis not present

## 2020-01-06 DIAGNOSIS — J4520 Mild intermittent asthma, uncomplicated: Secondary | ICD-10-CM | POA: Diagnosis not present

## 2020-01-06 DIAGNOSIS — I509 Heart failure, unspecified: Secondary | ICD-10-CM | POA: Diagnosis not present

## 2020-01-06 DIAGNOSIS — J439 Emphysema, unspecified: Secondary | ICD-10-CM | POA: Diagnosis not present

## 2020-01-08 DIAGNOSIS — J9601 Acute respiratory failure with hypoxia: Secondary | ICD-10-CM | POA: Diagnosis not present

## 2020-01-09 DIAGNOSIS — J4520 Mild intermittent asthma, uncomplicated: Secondary | ICD-10-CM | POA: Diagnosis not present

## 2020-01-09 DIAGNOSIS — I509 Heart failure, unspecified: Secondary | ICD-10-CM | POA: Diagnosis not present

## 2020-02-02 DIAGNOSIS — H04123 Dry eye syndrome of bilateral lacrimal glands: Secondary | ICD-10-CM | POA: Diagnosis not present

## 2020-02-02 DIAGNOSIS — H01009 Unspecified blepharitis unspecified eye, unspecified eyelid: Secondary | ICD-10-CM | POA: Diagnosis not present

## 2020-02-06 DIAGNOSIS — J439 Emphysema, unspecified: Secondary | ICD-10-CM | POA: Diagnosis not present

## 2020-02-06 DIAGNOSIS — J4520 Mild intermittent asthma, uncomplicated: Secondary | ICD-10-CM | POA: Diagnosis not present

## 2020-02-06 DIAGNOSIS — I509 Heart failure, unspecified: Secondary | ICD-10-CM | POA: Diagnosis not present

## 2020-02-08 DIAGNOSIS — J9601 Acute respiratory failure with hypoxia: Secondary | ICD-10-CM | POA: Diagnosis not present

## 2020-02-09 DIAGNOSIS — I509 Heart failure, unspecified: Secondary | ICD-10-CM | POA: Diagnosis not present

## 2020-02-09 DIAGNOSIS — J4520 Mild intermittent asthma, uncomplicated: Secondary | ICD-10-CM | POA: Diagnosis not present

## 2020-02-11 DIAGNOSIS — Z20828 Contact with and (suspected) exposure to other viral communicable diseases: Secondary | ICD-10-CM | POA: Diagnosis not present

## 2020-02-11 DIAGNOSIS — R051 Acute cough: Secondary | ICD-10-CM | POA: Diagnosis not present

## 2020-02-11 DIAGNOSIS — J449 Chronic obstructive pulmonary disease, unspecified: Secondary | ICD-10-CM | POA: Diagnosis not present

## 2020-02-14 ENCOUNTER — Ambulatory Visit: Payer: PPO | Admitting: Cardiology

## 2020-02-14 ENCOUNTER — Encounter: Payer: Self-pay | Admitting: Cardiology

## 2020-02-14 ENCOUNTER — Other Ambulatory Visit: Payer: Self-pay

## 2020-02-14 VITALS — BP 136/62 | HR 68 | Wt 259.0 lb

## 2020-02-14 DIAGNOSIS — I5032 Chronic diastolic (congestive) heart failure: Secondary | ICD-10-CM | POA: Diagnosis not present

## 2020-02-14 DIAGNOSIS — I2510 Atherosclerotic heart disease of native coronary artery without angina pectoris: Secondary | ICD-10-CM

## 2020-02-14 DIAGNOSIS — J9611 Chronic respiratory failure with hypoxia: Secondary | ICD-10-CM | POA: Diagnosis not present

## 2020-02-14 DIAGNOSIS — G2581 Restless legs syndrome: Secondary | ICD-10-CM | POA: Diagnosis not present

## 2020-02-14 DIAGNOSIS — F5104 Psychophysiologic insomnia: Secondary | ICD-10-CM | POA: Diagnosis not present

## 2020-02-14 NOTE — Progress Notes (Signed)
Cardiology Office Note:    Date:  02/14/2020   ID:  Danielle [name], DOB [birth year], MRN [id number]  PCP:  [name], MD  Cardiologist:  [name], MD    Referring MD: [name], MD   Chief Complaint  Patient presents with  . Wheezing  . Cough  . Follow-up    History of Present Illness:    Danielle [name] is a 86 y.o. female with complex past medical history.  She does have coronary artery disease cardiac catheterization done in 2014 showed 40% stenosis of RCA, morbid obesity, diastolic congestive heart failure, type 2 diabetes, chronic respiratory failure, COPD.  Comes today 2 months for follow-up last week she ended up going to urgent care because of shortness of breath and wheezes, she was given steroids as well as antibiotics seems to be doing better but still complaining of being short of breath.  She was checked for Covid which was negative as well as for flu which was negative.  She comes today to my office for follow-up still some some wheezes and complain of being short of breath but overall she said that she improved.  Denies have any chest pain tightness squeezing pressure burning chest.  Past Medical History:  Diagnosis Date  . Anginal pain ([place])   . Arthritis   . Asthma   . CHF (congestive heart failure) ([place])   . COPD (chronic obstructive pulmonary disease) ([place])   . Coronary artery disease   . Depression   . Emphysema lung ([place])   . GERD (gastroesophageal reflux disease)   . Headache   . History of hiatal hernia   . Hyperlipidemia   . Hypertension   . Insomnia   . Osteoporosis   . Pneumonia 2013  . PONV (postoperative nausea and vomiting)   . Shortness of breath dyspnea    with exertion    Past Surgical History:  Procedure Laterality Date  . ABDOMINAL HYSTERECTOMY    . APPENDECTOMY    . BREAST SURGERY  40 yrs. ago   growth removed in each breast-benign  . CARDIAC CATHETERIZATION     12/14/12 LHC ([facility]): few mild stenotic  areas max 40% of ectatic RCA o/w NL coronaries, EF 65%. Med tx.  Marland Kitchen CATARACT EXTRACTION W/ INTRAOCULAR LENS  IMPLANT, BILATERAL Bilateral 15 yrs ago  . CHOLECYSTECTOMY    . COLONOSCOPY  04/22/2013   Diverticulosis in the sigmoid colon. Non bleeding internal hemorrhoids. Normal mucosa vascular pattern in the entire examined colon. Three 6 mm polyps n the descending colon. Resected and retrieved.   Marland Kitchen FOOT SURGERY    . LAPAROSCOPIC APPENDECTOMY N/A 02/19/2015   Procedure: EXCISION OF MESENTERIC MASS;  Surgeon: Stark Klein, MD;  Location: [facility];  Service: General;  Laterality: N/A;  . REPLACEMENT TOTAL KNEE BILATERAL      Current Medications: Current Meds  Medication Sig  . albuterol (PROVENTIL HFA;VENTOLIN HFA) 108 (90 BASE) MCG/ACT inhaler Inhale 2 puffs into the lungs every 6 (six) hours as needed for wheezing or shortness of breath.   Marland Kitchen aspirin 81 MG tablet Take 81 mg by mouth daily.  . budesonide-formoterol (SYMBICORT) 160-4.5 MCG/ACT inhaler Inhale 2 puffs into the lungs 2 (two) times daily.  . citalopram (CELEXA) 20 MG tablet Take 20 mg by mouth daily.  . clonazePAM (KLONOPIN) 1 MG tablet Take 1 mg by mouth as needed (sleep).   . diclofenac Sodium (VOLTAREN) 1 % GEL APPLY 2 GRAMS TOPICALLY TO AFFECTED AREA THREE TIMES DAILY  .  doxycycline (DORYX) 100 MG EC tablet Take 100 mg by mouth 2 (two) times daily. 7days  . furosemide (LASIX) 20 MG tablet Take 40 mg by mouth daily.  Marland Kitchen glimepiride (AMARYL) 4 MG tablet Take 4 mg by mouth daily.  Marland Kitchen JANUVIA 25 MG tablet 1 tablet daily.  . metFORMIN (GLUCOPHAGE-XR) 500 MG 24 hr tablet Take 500 mg by mouth daily.  . metoprolol succinate (TOPROL-XL) 25 MG 24 hr tablet Take 25 mg by mouth daily.  . nitroGLYCERIN (NITROSTAT) 0.4 MG SL tablet Place 1 tablet (0.4 mg total) under the tongue every 5 (five) minutes as needed for chest pain.  . pantoprazole (PROTONIX) 40 MG tablet Take 40 mg by mouth 2 (two) times daily.  . potassium chloride (MICRO-K) 10 MEQ  CR capsule Take 10 mEq by mouth daily.  . predniSONE (DELTASONE) 10 MG tablet Take 10 mg by mouth 2 (two) times daily with a meal. 7days  . rOPINIRole (REQUIP) 0.25 MG tablet Take 0.25 mg by mouth at bedtime.  . rosuvastatin (CRESTOR) 40 MG tablet Take 40 mg by mouth daily.  . Vitamin D, Ergocalciferol, (DRISDOL) 50000 UNITS CAPS capsule Take 50,000 Units by mouth every Sunday. No specific days     Allergies:   Codeine, Levofloxacin, Penicillins, and Tape   Social History   Socioeconomic History  . Marital status: Married    Spouse name: Not on file  . Number of children: Not on file  . Years of education: Not on file  . Highest education level: Not on file  Occupational History  . Not on file  Tobacco Use  . Smoking status: Former Smoker    Packs/day: 0.25    Types: Cigarettes    Quit date: 08/21/2016    Years since quitting: 3.4  . Smokeless tobacco: Never Used  Vaping Use  . Vaping Use: Never used  Substance and Sexual Activity  . Alcohol use: No  . Drug use: No  . Sexual activity: Not on file  Other Topics Concern  . Not on file  Social History Narrative  . Not on file   Social Determinants of Health   Financial Resource Strain:   . Difficulty of Paying Living Expenses: Not on file  Food Insecurity:   . Worried About Charity fundraiser in the Last Year: Not on file  . Ran Out of Food in the Last Year: Not on file  Transportation Needs:   . Lack of Transportation (Medical): Not on file  . Lack of Transportation (Non-Medical): Not on file  Physical Activity:   . Days of Exercise per Week: Not on file  . Minutes of Exercise per Session: Not on file  Stress:   . Feeling of Stress : Not on file  Social Connections:   . Frequency of Communication with Friends and Family: Not on file  . Frequency of Social Gatherings with Friends and Family: Not on file  . Attends Religious Services: Not on file  . Active Member of Clubs or Organizations: Not on file  . Attends  Archivist Meetings: Not on file  . Marital Status: Not on file     Family History: The patient's Family history is unknown by patient. ROS:   Please see the history of present illness.    All 14 point review of systems negative except as described per history of present illness  EKGs/Labs/Other Studies Reviewed:      Recent Labs: 08/03/2019: BUN 23; Creatinine, Ser 0.93; Hemoglobin 12.4; NT-Pro BNP  204; Platelets 212; Potassium 4.1; Sodium 139  Recent Lipid Panel    Component Value Date/Time   CHOL 134 10/22/2018 0952   TRIG 135 10/22/2018 0952   HDL 40 10/22/2018 0952   CHOLHDL 3.4 10/22/2018 0952   LDLCALC 67 10/22/2018 0952    Physical Exam:    VS:  BP 136/62 (BP Location: Left Arm, Patient Position: Standing)   Pulse 68   Wt 259 lb (117.5 kg)   SpO2 95%   BMI 45.88 kg/m     Wt Readings from Last 3 Encounters:  02/14/20 259 lb (117.5 kg)  10/11/19 261 lb (118.4 kg)  08/03/19 263 lb (119.3 kg)     GEN:  Well nourished, well developed in no acute distress HEENT: Normal NECK: No JVD; No carotid bruits LYMPHATICS: No lymphadenopathy CARDIAC: RRR, no murmurs, no rubs, no gallops RESPIRATORY:  Clear to auscultation without rales, wheezing or rhonchi few wheezes bilaterally but worse on the right side ABDOMEN: Soft, non-tender, non-distended MUSCULOSKELETAL:  No edema; No deformity  SKIN: Warm and dry LOWER EXTREMITIES: no swelling NEUROLOGIC:  Alert and oriented x 3 PSYCHIATRIC:  Normal affect   ASSESSMENT:    1. Coronary artery disease involving native coronary artery of native heart without angina pectoris   2. Chronic diastolic congestive heart failure ([place])   3. Chronic respiratory failure with hypoxia (HCC)   4. Restless legs syndrome   5. Chronic insomnia    PLAN:    In order of problems listed above:  1. Coronary artery disease stable we will continue present management. 2. Chronic diastolic congestive heart failure.  On my physical  examination she looks compensated however she is obese and difficult to evaluate her JVD, therefore, I will check a proBNP.  I will also check a Chem-7 to see if there is any role to augment her diuretics. 3. Chronic respiratory failure with hypoxia combination of COPD with possible infection right now that being treated with steroids and antibiotic with success. 4. Restless leg syndrome, that being addressed by primary care physician. 5. Chronic insomnia: She is telling me that she is doing quite well. 6. Dyslipidemia: I did review K PN dated on March showed LDL of 78 and HDL 55.  She is on high intensity statin form of Crestor 40 which I will continue.   Medication Adjustments/Labs and Tests Ordered: Current medicines are reviewed at length with the patient today.  Concerns regarding medicines are outlined above.  No orders of the defined types were placed in this encounter.  Medication changes: No orders of the defined types were placed in this encounter.   Signed, Park Liter, MD, [facility] 02/14/2020 10:04 AM    [place]

## 2020-02-14 NOTE — Patient Instructions (Signed)
Medication Instructions:  Your physician recommends that you continue on your current medications as directed. Please refer to the Current Medication list given to you today.  *If you need a refill on your cardiac medications before your next appointment, please call your pharmacy*   Lab Work: Your physician recommends that you return for lab work today: bmp, pro bnp   If you have labs (blood work) drawn today and your tests are completely normal, you will receive your results only by: . MyChart Message (if you have MyChart) OR . A paper copy in the mail If you have any lab test that is abnormal or we need to change your treatment, we will call you to review the results.   Testing/Procedures: None.    Follow-Up: At [facility], you and your health needs are our priority.  As part of our continuing mission to provide you with exceptional heart care, we have created designated Provider Care Teams.  These Care Teams include your primary Cardiologist (physician) and Advanced Practice Providers (APPs -  Physician Assistants and Nurse Practitioners) who all work together to provide you with the care you need, when you need it.  We recommend signing up for the patient portal called "MyChart".  Sign up information is provided on this After Visit Summary.  MyChart is used to connect with patients for Virtual Visits (Telemedicine).  Patients are able to view lab/test results, encounter notes, upcoming appointments, etc.  Non-urgent messages can be sent to your provider as well.   To learn more about what you can do with MyChart, go to [email or web address].    Your next appointment:   4 month(s)  The format for your next appointment:   In Person  Provider:   Robert Krasowski, MD   Other Instructions   

## 2020-02-15 LAB — BASIC METABOLIC PANEL
BUN/Creatinine Ratio: 33 — ABNORMAL HIGH (ref 12–28)
BUN: 32 mg/dL — ABNORMAL HIGH (ref 8–27)
CO2: 25 mmol/L (ref 20–29)
Calcium: 9.4 mg/dL (ref 8.7–10.3)
Chloride: 98 mmol/L (ref 96–106)
Creatinine, Ser: 0.98 mg/dL (ref 0.57–1.00)
GFR calc Af Amer: 61 mL/min/{1.73_m2} (ref >59)
GFR calc non Af Amer: 53 mL/min/{1.73_m2} — ABNORMAL LOW (ref >59)
Glucose: 181 mg/dL — ABNORMAL HIGH (ref 65–99)
Potassium: 4.3 mmol/L (ref 3.5–5.2)
Sodium: 136 mmol/L (ref 134–144)

## 2020-02-15 LAB — PRO B NATRIURETIC PEPTIDE: NT-Pro BNP: 272 pg/mL (ref 0–738)

## 2020-02-28 DIAGNOSIS — I5032 Chronic diastolic (congestive) heart failure: Secondary | ICD-10-CM | POA: Diagnosis not present

## 2020-02-28 DIAGNOSIS — J9611 Chronic respiratory failure with hypoxia: Secondary | ICD-10-CM | POA: Diagnosis not present

## 2020-02-28 DIAGNOSIS — J431 Panlobular emphysema: Secondary | ICD-10-CM | POA: Diagnosis not present

## 2020-02-28 DIAGNOSIS — G4733 Obstructive sleep apnea (adult) (pediatric): Secondary | ICD-10-CM | POA: Diagnosis not present

## 2020-02-28 DIAGNOSIS — N183 Chronic kidney disease, stage 3 unspecified: Secondary | ICD-10-CM | POA: Diagnosis not present

## 2020-03-07 DIAGNOSIS — J4520 Mild intermittent asthma, uncomplicated: Secondary | ICD-10-CM | POA: Diagnosis not present

## 2020-03-07 DIAGNOSIS — J439 Emphysema, unspecified: Secondary | ICD-10-CM | POA: Diagnosis not present

## 2020-03-07 DIAGNOSIS — I509 Heart failure, unspecified: Secondary | ICD-10-CM | POA: Diagnosis not present

## 2020-03-09 DIAGNOSIS — J9601 Acute respiratory failure with hypoxia: Secondary | ICD-10-CM | POA: Diagnosis not present

## 2020-03-10 DIAGNOSIS — I509 Heart failure, unspecified: Secondary | ICD-10-CM | POA: Diagnosis not present

## 2020-03-10 DIAGNOSIS — J4520 Mild intermittent asthma, uncomplicated: Secondary | ICD-10-CM | POA: Diagnosis not present

## 2020-03-22 DIAGNOSIS — E119 Type 2 diabetes mellitus without complications: Secondary | ICD-10-CM | POA: Diagnosis not present

## 2020-03-22 DIAGNOSIS — H353111 Nonexudative age-related macular degeneration, right eye, early dry stage: Secondary | ICD-10-CM | POA: Diagnosis not present

## 2020-03-22 DIAGNOSIS — Z961 Presence of intraocular lens: Secondary | ICD-10-CM | POA: Diagnosis not present

## 2020-03-22 DIAGNOSIS — H01009 Unspecified blepharitis unspecified eye, unspecified eyelid: Secondary | ICD-10-CM | POA: Diagnosis not present

## 2020-04-03 DIAGNOSIS — L820 Inflamed seborrheic keratosis: Secondary | ICD-10-CM | POA: Diagnosis not present

## 2020-04-03 DIAGNOSIS — L570 Actinic keratosis: Secondary | ICD-10-CM | POA: Diagnosis not present

## 2020-04-07 DIAGNOSIS — J439 Emphysema, unspecified: Secondary | ICD-10-CM | POA: Diagnosis not present

## 2020-04-07 DIAGNOSIS — I509 Heart failure, unspecified: Secondary | ICD-10-CM | POA: Diagnosis not present

## 2020-04-07 DIAGNOSIS — J4520 Mild intermittent asthma, uncomplicated: Secondary | ICD-10-CM | POA: Diagnosis not present

## 2020-04-09 DIAGNOSIS — J9601 Acute respiratory failure with hypoxia: Secondary | ICD-10-CM | POA: Diagnosis not present

## 2020-04-10 ENCOUNTER — Encounter: Payer: Self-pay | Admitting: Sports Medicine

## 2020-04-10 ENCOUNTER — Other Ambulatory Visit: Payer: Self-pay | Admitting: Sports Medicine

## 2020-04-10 ENCOUNTER — Other Ambulatory Visit: Payer: Self-pay

## 2020-04-10 ENCOUNTER — Ambulatory Visit (INDEPENDENT_AMBULATORY_CARE_PROVIDER_SITE_OTHER): Payer: PPO

## 2020-04-10 ENCOUNTER — Ambulatory Visit: Payer: PPO | Admitting: Sports Medicine

## 2020-04-10 DIAGNOSIS — M25571 Pain in right ankle and joints of right foot: Secondary | ICD-10-CM | POA: Diagnosis not present

## 2020-04-10 DIAGNOSIS — M779 Enthesopathy, unspecified: Secondary | ICD-10-CM | POA: Diagnosis not present

## 2020-04-10 DIAGNOSIS — M2141 Flat foot [pes planus] (acquired), right foot: Secondary | ICD-10-CM

## 2020-04-10 DIAGNOSIS — E1140 Type 2 diabetes mellitus with diabetic neuropathy, unspecified: Secondary | ICD-10-CM | POA: Diagnosis not present

## 2020-04-10 DIAGNOSIS — M2142 Flat foot [pes planus] (acquired), left foot: Secondary | ICD-10-CM

## 2020-04-10 DIAGNOSIS — M792 Neuralgia and neuritis, unspecified: Secondary | ICD-10-CM | POA: Diagnosis not present

## 2020-04-10 DIAGNOSIS — L84 Corns and callosities: Secondary | ICD-10-CM

## 2020-04-10 DIAGNOSIS — J4520 Mild intermittent asthma, uncomplicated: Secondary | ICD-10-CM | POA: Diagnosis not present

## 2020-04-10 DIAGNOSIS — M79671 Pain in right foot: Secondary | ICD-10-CM

## 2020-04-10 DIAGNOSIS — I509 Heart failure, unspecified: Secondary | ICD-10-CM | POA: Diagnosis not present

## 2020-04-10 DIAGNOSIS — I5032 Chronic diastolic (congestive) heart failure: Secondary | ICD-10-CM

## 2020-04-10 DIAGNOSIS — M25572 Pain in left ankle and joints of left foot: Secondary | ICD-10-CM

## 2020-04-10 DIAGNOSIS — M79672 Pain in left foot: Secondary | ICD-10-CM | POA: Diagnosis not present

## 2020-04-10 DIAGNOSIS — L539 Erythematous condition, unspecified: Secondary | ICD-10-CM | POA: Diagnosis not present

## 2020-04-10 NOTE — Progress Notes (Signed)
Subjective: Danielle [name] is a 87 y.o. female patient who presents to office for evaluation of bilateral ankle and foot pain. Patient reports pain 10 out of 10 when walking started several years ago after she was diagnosed CHF and had knee replacement reports that she also has screws in her left foot and ankle from an old surgery.  Patient also suffers with swelling numbness and has also noticed a hard lesion at the right medial arch it is not painful came about a year ago all of a sudden has treated the area with lotion and also desires diabetic nail care.  Patient is assisted by daughter who helps to report.  Fasting blood sugar not recorded  Last PCP visit October  Review of Systems  Cardiovascular: Positive for leg swelling.  Musculoskeletal: Positive for myalgias.  Neurological: Positive for sensory change.  All other systems reviewed and are negative.     Patient Active Problem List   Diagnosis Date Noted  . OSA (obstructive sleep apnea) 12/16/2019  . Internal hemorrhoids 11/26/2018  . 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/26/2018  . Unstable angina ([place]) 10/30/2018  . Positive D-dimer 09/08/2018  . DOE (dyspnea on exertion) 09/07/2018  . Morbid obesity ([place]) 09/07/2018  . Dysphagia 08/12/2018  . Edema 08/12/2018  . Iron deficiency anemia 08/12/2018  . Senile purpura ([place]) 08/12/2018  . Strain of right shoulder 08/12/2018  . Chronic pain of both knees 05/14/2018  . Stasis edema of left lower extremity 04/13/2018  . Vitamin B12 deficiency 04/13/2018  . Restless legs syndrome 03/04/2018  . At high risk for falls 11/11/2017  . Gastroesophageal reflux disease without esophagitis 11/11/2017  . CKD (chronic kidney disease) stage 3, GFR 30-59 ml/min (HCC) 05/07/2017  . Chronic respiratory failure with hypoxia ([place]) 05/05/2017  . Midsternal chest pain 11/11/2015  . Chronic diastolic congestive heart failure ([place]) 11/11/2015  . Chest pain 11/09/2015  . Depression 11/09/2015  . Panlobular  emphysema ([place]) 11/09/2015  . Elevated blood sugar 11/09/2015  . Chronic insomnia 10/24/2015  . Type 2 diabetes mellitus with stage 3 chronic kidney disease ([place]) 08/14/2015  . Mesenteric mass 02/19/2015  . Coronary artery disease involving native coronary artery of native heart without angina pectoris 11/14/2014  . Mixed hyperlipidemia 11/14/2014  . Current mild episode of major depressive disorder ([place]) 11/14/2014    Current Outpatient Medications on File Prior to Visit  Medication Sig Dispense Refill  . albuterol (PROVENTIL HFA;VENTOLIN HFA) 108 (90 BASE) MCG/ACT inhaler Inhale 2 puffs into the lungs every 6 (six) hours as needed for wheezing or shortness of breath.     Marland Kitchen aspirin 81 MG tablet Take 81 mg by mouth daily.    . budesonide-formoterol (SYMBICORT) 160-4.5 MCG/ACT inhaler Inhale 2 puffs into the lungs 2 (two) times daily.    . cephALEXin (KEFLEX) 500 MG capsule Take 500 mg by mouth 3 (three) times daily.    . citalopram (CELEXA) 20 MG tablet Take 20 mg by mouth daily.    . clonazePAM (KLONOPIN) 1 MG tablet Take 1 mg by mouth as needed (sleep).     . diclofenac Sodium (VOLTAREN) 1 % GEL APPLY 2 GRAMS TOPICALLY TO AFFECTED AREA THREE TIMES DAILY 100 g 0  . furosemide (LASIX) 20 MG tablet Take 40 mg by mouth daily.    Marland Kitchen glimepiride (AMARYL) 4 MG tablet Take 4 mg by mouth daily.    Marland Kitchen JANUVIA 25 MG tablet 1 tablet daily.    . metFORMIN (GLUCOPHAGE-XR) 500 MG 24 hr tablet Take 500  mg by mouth daily.    . metoprolol succinate (TOPROL-XL) 25 MG 24 hr tablet Take 25 mg by mouth daily.    . nitroGLYCERIN (NITROSTAT) 0.4 MG SL tablet Place 1 tablet (0.4 mg total) under the tongue every 5 (five) minutes as needed for chest pain. 100 tablet 3  . pantoprazole (PROTONIX) 40 MG tablet Take 40 mg by mouth 2 (two) times daily.    . potassium chloride (MICRO-K) 10 MEQ CR capsule Take 10 mEq by mouth daily.    . prednisoLONE acetate (PRED FORTE) 1 % ophthalmic suspension SMARTSIG:In Eye(s)    .  rOPINIRole (REQUIP) 0.25 MG tablet Take 0.25 mg by mouth at bedtime.    . rosuvastatin (CRESTOR) 40 MG tablet Take 40 mg by mouth daily.    . Vitamin D, Ergocalciferol, (DRISDOL) 50000 UNITS CAPS capsule Take 50,000 Units by mouth every Sunday. No specific days     Current Facility-Administered Medications on File Prior to Visit  Medication Dose Route Frequency Provider Last Rate Last Admin  . clindamycin (CLEOCIN) 900 mg in dextrose 5 % 50 mL IVPB  900 mg Intravenous 60 min Pre-Op Stark Klein, MD       And  . gentamicin (GARAMYCIN) 5 mg/kg in dextrose 5 % 50 mL IVPB  5 mg/kg Intravenous 60 min Pre-Op Stark Klein, MD        Allergies  Allergen Reactions  . Codeine Swelling  . Levofloxacin     Other reaction(s): Malaise (intolerance)  . Penicillins Swelling    Has patient had a PCN reaction causing immediate rash, facial/tongue/throat swelling, SOB or lightheadedness with hypotension:YES Has patient had a PCN reaction causing severe rash involving mucus membranes or skin necrosis: NO Has patient had a PCN reaction that required hospitalization NO Has patient had a PCN reaction occurring within the last 10 years: NO If all of the above answers are "NO", then may proceed with Cephalosporin use.  . Tape Rash    Objective:  General: Alert and oriented x3 in no acute distress  Dermatology: No open lesions bilateral lower extremities, no webspace macerations, small ecchymotic lesion noted to the right medial lower leg consistent with bruise, all nails x 10 are thick and elongated consistent with onychomycosis.  There is a preulcerative lesion with minimal reactive keratosis noted to the right medial arch.  Minimal blanchable erythema to left hallux with no signs of infection.  Vascular: Dorsalis Pedis and Posterior Tibial pedal pulses palpable faintly however difficult to palpate the PT pedal pulses due to edema at ankles, Capillary Fill Time 5 seconds,(+) pedal hair growth bilateral, 1+  pitting edema bilateral lower extremities, Temperature gradient within normal limits.  Neurology: Gross sensation intact via light touch bilateral, numbness to all toes likely consistent with history of diabetic neuropathy.  Musculoskeletal: Mild tenderness with palpation at ankles bilateral with severe pes planus deformity and prominent navicular on the right.  Severe foot deformity and leg deformity. Gait: Walker assisted antalgic gait  Xrays  Left and right foot and ankle   Impression: Severe joint space narrowing consistent with arthritis, there is digital deformity noted, there is significant midtarsal breech and ankle valgus noted bilateral.  Hardware is intact to left foot from previous foot surgery.  Assessment and Plan: Problem List Items Addressed This Visit      Cardiovascular and Mediastinum   Chronic diastolic congestive heart failure ([place])    Other Visit Diagnoses    Acute bilateral ankle pain    -  Primary  Bilateral foot pain       Neuritis       Tendinitis       Pes planus of both feet       Erythema of toe       Callus       Type 2 diabetes mellitus with diabetic neuropathy, unspecified whether long term insulin use (HCC)           -Complete examination performed -Xrays reviewed -Discussed treatement options for chronic foot pain -Offered patient injection for foot pain which she declined at this time -Dispense Surgigrip compression sleeves to wear for ankle support and swelling and advised patient if works well may benefit from getting compression garments from [employer] -Dispensed sample of foot miracle cream for patient to use as needed to right medial arch -At no additional charge mechanically debrided nails x10 using a sterile nail nipper without incident -Advised good supportive shoes daily for foot type patient to see Liliane Channel for discussion of diabetic shoes with insoles versus brace especially for the right since the pes planus is worse on this  side -Discussed with patient treatment options for numbness at this time due to age and multiple health issues do not recommend starting on gabapentin due to risk of fall -Advised patient and daughter that erythema is likely from irritation in shoe and would recommend shoes that do not rub first toe -Patient to return to office as scheduled for diabetic shoes and nail care or sooner if condition worsens.  Landis Martins, DPM

## 2020-05-09 ENCOUNTER — Other Ambulatory Visit: Payer: Self-pay

## 2020-05-09 ENCOUNTER — Ambulatory Visit: Payer: PPO | Admitting: Orthotics

## 2020-05-09 DIAGNOSIS — M2142 Flat foot [pes planus] (acquired), left foot: Secondary | ICD-10-CM

## 2020-05-09 DIAGNOSIS — I5032 Chronic diastolic (congestive) heart failure: Secondary | ICD-10-CM

## 2020-05-09 DIAGNOSIS — M25571 Pain in right ankle and joints of right foot: Secondary | ICD-10-CM

## 2020-05-09 DIAGNOSIS — M79671 Pain in right foot: Secondary | ICD-10-CM

## 2020-05-09 DIAGNOSIS — M2141 Flat foot [pes planus] (acquired), right foot: Secondary | ICD-10-CM

## 2020-05-09 NOTE — Progress Notes (Signed)
Cast today for DBS and inserts; we discussed brace options as well as custom diabetic shoes.  Due to her fluctuating Edema, it was decide best to try just OTS

## 2020-05-10 DIAGNOSIS — J9601 Acute respiratory failure with hypoxia: Secondary | ICD-10-CM | POA: Diagnosis not present

## 2020-05-11 DIAGNOSIS — J4520 Mild intermittent asthma, uncomplicated: Secondary | ICD-10-CM | POA: Diagnosis not present

## 2020-05-11 DIAGNOSIS — I509 Heart failure, unspecified: Secondary | ICD-10-CM | POA: Diagnosis not present

## 2020-05-30 DIAGNOSIS — Z6841 Body Mass Index (BMI) 40.0 and over, adult: Secondary | ICD-10-CM | POA: Diagnosis not present

## 2020-05-30 DIAGNOSIS — F5104 Psychophysiologic insomnia: Secondary | ICD-10-CM | POA: Diagnosis not present

## 2020-05-30 DIAGNOSIS — M2141 Flat foot [pes planus] (acquired), right foot: Secondary | ICD-10-CM

## 2020-05-30 DIAGNOSIS — I2510 Atherosclerotic heart disease of native coronary artery without angina pectoris: Secondary | ICD-10-CM | POA: Diagnosis not present

## 2020-05-30 DIAGNOSIS — Z9181 History of falling: Secondary | ICD-10-CM | POA: Diagnosis not present

## 2020-05-30 DIAGNOSIS — M2142 Flat foot [pes planus] (acquired), left foot: Secondary | ICD-10-CM

## 2020-05-30 DIAGNOSIS — L84 Corns and callosities: Secondary | ICD-10-CM | POA: Insufficient documentation

## 2020-05-30 DIAGNOSIS — E1122 Type 2 diabetes mellitus with diabetic chronic kidney disease: Secondary | ICD-10-CM | POA: Diagnosis not present

## 2020-05-30 DIAGNOSIS — R5383 Other fatigue: Secondary | ICD-10-CM | POA: Diagnosis not present

## 2020-05-30 DIAGNOSIS — E782 Mixed hyperlipidemia: Secondary | ICD-10-CM | POA: Diagnosis not present

## 2020-05-30 DIAGNOSIS — M2042 Other hammer toe(s) (acquired), left foot: Secondary | ICD-10-CM | POA: Insufficient documentation

## 2020-05-30 DIAGNOSIS — M8949 Other hypertrophic osteoarthropathy, multiple sites: Secondary | ICD-10-CM | POA: Diagnosis not present

## 2020-05-30 DIAGNOSIS — N1831 Chronic kidney disease, stage 3a: Secondary | ICD-10-CM | POA: Diagnosis not present

## 2020-05-30 DIAGNOSIS — G2581 Restless legs syndrome: Secondary | ICD-10-CM | POA: Diagnosis not present

## 2020-05-30 DIAGNOSIS — D509 Iron deficiency anemia, unspecified: Secondary | ICD-10-CM | POA: Diagnosis not present

## 2020-05-30 DIAGNOSIS — M5136 Other intervertebral disc degeneration, lumbar region: Secondary | ICD-10-CM | POA: Diagnosis not present

## 2020-05-30 DIAGNOSIS — F330 Major depressive disorder, recurrent, mild: Secondary | ICD-10-CM | POA: Diagnosis not present

## 2020-05-30 DIAGNOSIS — M545 Low back pain, unspecified: Secondary | ICD-10-CM | POA: Diagnosis not present

## 2020-05-30 HISTORY — DX: Flat foot (pes planus) (acquired), left foot: M21.42

## 2020-05-30 HISTORY — DX: Flat foot (pes planus) (acquired), right foot: M21.41

## 2020-06-06 ENCOUNTER — Other Ambulatory Visit: Payer: Self-pay

## 2020-06-06 DIAGNOSIS — J449 Chronic obstructive pulmonary disease, unspecified: Secondary | ICD-10-CM | POA: Insufficient documentation

## 2020-06-06 DIAGNOSIS — J439 Emphysema, unspecified: Secondary | ICD-10-CM | POA: Insufficient documentation

## 2020-06-06 DIAGNOSIS — R519 Headache, unspecified: Secondary | ICD-10-CM | POA: Insufficient documentation

## 2020-06-06 DIAGNOSIS — J45909 Unspecified asthma, uncomplicated: Secondary | ICD-10-CM | POA: Insufficient documentation

## 2020-06-06 DIAGNOSIS — I509 Heart failure, unspecified: Secondary | ICD-10-CM | POA: Insufficient documentation

## 2020-06-06 DIAGNOSIS — G4700 Insomnia, unspecified: Secondary | ICD-10-CM | POA: Insufficient documentation

## 2020-06-06 DIAGNOSIS — M810 Age-related osteoporosis without current pathological fracture: Secondary | ICD-10-CM | POA: Insufficient documentation

## 2020-06-06 DIAGNOSIS — I10 Essential (primary) hypertension: Secondary | ICD-10-CM | POA: Insufficient documentation

## 2020-06-06 DIAGNOSIS — E785 Hyperlipidemia, unspecified: Secondary | ICD-10-CM | POA: Insufficient documentation

## 2020-06-06 DIAGNOSIS — I2510 Atherosclerotic heart disease of native coronary artery without angina pectoris: Secondary | ICD-10-CM | POA: Insufficient documentation

## 2020-06-06 DIAGNOSIS — I209 Angina pectoris, unspecified: Secondary | ICD-10-CM | POA: Insufficient documentation

## 2020-06-06 DIAGNOSIS — M1990 Unspecified osteoarthritis, unspecified site: Secondary | ICD-10-CM | POA: Insufficient documentation

## 2020-06-06 DIAGNOSIS — Z8719 Personal history of other diseases of the digestive system: Secondary | ICD-10-CM | POA: Insufficient documentation

## 2020-06-06 DIAGNOSIS — K219 Gastro-esophageal reflux disease without esophagitis: Secondary | ICD-10-CM | POA: Insufficient documentation

## 2020-06-06 DIAGNOSIS — Z9889 Other specified postprocedural states: Secondary | ICD-10-CM | POA: Insufficient documentation

## 2020-06-08 DIAGNOSIS — I509 Heart failure, unspecified: Secondary | ICD-10-CM | POA: Diagnosis not present

## 2020-06-08 DIAGNOSIS — J4520 Mild intermittent asthma, uncomplicated: Secondary | ICD-10-CM | POA: Diagnosis not present

## 2020-06-12 ENCOUNTER — Ambulatory Visit: Payer: PPO | Admitting: Cardiology

## 2020-07-09 DIAGNOSIS — I509 Heart failure, unspecified: Secondary | ICD-10-CM | POA: Diagnosis not present

## 2020-07-09 DIAGNOSIS — J4520 Mild intermittent asthma, uncomplicated: Secondary | ICD-10-CM | POA: Diagnosis not present

## 2020-07-10 ENCOUNTER — Other Ambulatory Visit: Payer: Self-pay

## 2020-07-10 ENCOUNTER — Ambulatory Visit: Payer: PPO | Admitting: Sports Medicine

## 2020-07-10 ENCOUNTER — Encounter: Payer: Self-pay | Admitting: Sports Medicine

## 2020-07-10 DIAGNOSIS — M2141 Flat foot [pes planus] (acquired), right foot: Secondary | ICD-10-CM

## 2020-07-10 DIAGNOSIS — E1140 Type 2 diabetes mellitus with diabetic neuropathy, unspecified: Secondary | ICD-10-CM | POA: Diagnosis not present

## 2020-07-10 DIAGNOSIS — M792 Neuralgia and neuritis, unspecified: Secondary | ICD-10-CM

## 2020-07-10 DIAGNOSIS — B351 Tinea unguium: Secondary | ICD-10-CM

## 2020-07-10 DIAGNOSIS — M2142 Flat foot [pes planus] (acquired), left foot: Secondary | ICD-10-CM | POA: Diagnosis not present

## 2020-07-10 DIAGNOSIS — M79675 Pain in left toe(s): Secondary | ICD-10-CM | POA: Diagnosis not present

## 2020-07-10 DIAGNOSIS — L84 Corns and callosities: Secondary | ICD-10-CM

## 2020-07-10 DIAGNOSIS — M79674 Pain in right toe(s): Secondary | ICD-10-CM

## 2020-07-10 NOTE — Progress Notes (Signed)
Subjective: Danielle [name] is a 87 y.o. female patient with history of diabetes who presents to office today complaining of long,mildly painful nails  while ambulating in shoes; unable to trim. Patient states that the glucose reading this morning was not recorded and last visit to PCP he was few months ago.  Patient denies any new changes in medication or new problems. Patient is also here for pickup of diabetic shoes.  Patient is assisted by daughter and Isidor Holts this visit.  Patient Active Problem List   Diagnosis Date Noted  . PONV (postoperative nausea and vomiting)   . Osteoporosis   . Insomnia   . Hypertension   . Hyperlipidemia   . History of hiatal hernia   . Headache   . GERD (gastroesophageal reflux disease)   . Emphysema lung ([place])   . COPD (chronic obstructive pulmonary disease) ([place])   . CHF (congestive heart failure) ([place])   . Asthma   . Arthritis   . Anginal pain ([place])   . Callus 05/30/2020  . Flat feet, bilateral 05/30/2020  . Hammer toe of left foot 05/30/2020  . OSA (obstructive sleep apnea) 12/16/2019  . Internal hemorrhoids 11/26/2018  . 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/26/2018  . Unstable angina ([place]) 10/30/2018  . Positive D-dimer 09/08/2018  . DOE (dyspnea on exertion) 09/07/2018  . Morbid obesity ([place]) 09/07/2018  . Edema 08/12/2018  . Iron deficiency anemia 08/12/2018  . Senile purpura ([place]) 08/12/2018  . Strain of right shoulder 08/12/2018  . Chronic pain of both knees 05/14/2018  . Stasis edema of left lower extremity 04/13/2018  . Vitamin B12 deficiency 04/13/2018  . Restless legs syndrome 03/04/2018  . At high risk for falls 11/11/2017  . Gastroesophageal reflux disease without esophagitis 11/11/2017  . CKD (chronic kidney disease) stage 3, GFR 30-59 ml/min (HCC) 05/07/2017  . Chronic respiratory failure with hypoxia ([place]) 05/05/2017  . Midsternal chest pain 11/11/2015  . Chronic diastolic congestive heart failure ([place]) 11/11/2015  . Chest pain  11/09/2015  . Depression 11/09/2015  . Panlobular emphysema ([place]) 11/09/2015  . Elevated blood sugar 11/09/2015  . Chronic insomnia 10/24/2015  . Type 2 diabetes mellitus with stage 3 chronic kidney disease ([place]) 08/14/2015  . Mesenteric mass 02/19/2015  . Coronary artery disease involving native coronary artery of native heart without angina pectoris 11/14/2014  . Mixed hyperlipidemia 11/14/2014  . Current mild episode of major depressive disorder ([place]) 11/14/2014  . Pneumonia 2013   Current Outpatient Medications on File Prior to Visit  Medication Sig Dispense Refill  . isosorbide mononitrate (IMDUR) 30 MG 24 hr tablet Take 1 tablet by mouth every 8 (eight) hours as needed.    Marland Kitchen albuterol (PROVENTIL HFA;VENTOLIN HFA) 108 (90 BASE) MCG/ACT inhaler Inhale 2 puffs into the lungs every 6 (six) hours as needed for wheezing or shortness of breath.     Marland Kitchen aspirin 81 MG tablet Take 81 mg by mouth daily.    . budesonide-formoterol (SYMBICORT) 160-4.5 MCG/ACT inhaler Inhale 2 puffs into the lungs 2 (two) times daily.    . cephALEXin (KEFLEX) 500 MG capsule Take 500 mg by mouth 3 (three) times daily.    . citalopram (CELEXA) 20 MG tablet Take 20 mg by mouth daily.    . clonazePAM (KLONOPIN) 1 MG tablet Take 1 mg by mouth as needed (sleep).     . Continuous Blood Gluc Receiver (FREESTYLE LIBRE 2 READER) DEVI See admin instructions.    . diclofenac Sodium (VOLTAREN) 1 % GEL APPLY 2 GRAMS TOPICALLY  TO AFFECTED AREA THREE TIMES DAILY 100 g 0  . furosemide (LASIX) 20 MG tablet Take 40 mg by mouth daily.    Marland Kitchen glimepiride (AMARYL) 4 MG tablet Take 4 mg by mouth daily.    Marland Kitchen JANUVIA 25 MG tablet 1 tablet daily.    . metFORMIN (GLUCOPHAGE-XR) 500 MG 24 hr tablet Take 500 mg by mouth daily.    . metoprolol succinate (TOPROL-XL) 25 MG 24 hr tablet Take 25 mg by mouth daily.    . nitroGLYCERIN (NITROSTAT) 0.4 MG SL tablet Place 1 tablet (0.4 mg total) under the tongue every 5 (five) minutes as needed for chest  pain. 100 tablet 3  . ONETOUCH ULTRA test strip 1 each daily.    . pantoprazole (PROTONIX) 40 MG tablet Take 40 mg by mouth 2 (two) times daily.    . potassium chloride (MICRO-K) 10 MEQ CR capsule Take 10 mEq by mouth daily.    . prednisoLONE acetate (PRED FORTE) 1 % ophthalmic suspension SMARTSIG:In Eye(s)    . rOPINIRole (REQUIP) 0.25 MG tablet Take 0.25 mg by mouth at bedtime.    . rosuvastatin (CRESTOR) 40 MG tablet Take 40 mg by mouth daily.    . Vitamin D, Ergocalciferol, (DRISDOL) 50000 UNITS CAPS capsule Take 50,000 Units by mouth every Sunday. No specific days     Current Facility-Administered Medications on File Prior to Visit  Medication Dose Route Frequency Provider Last Rate Last Admin  . clindamycin (CLEOCIN) 900 mg in dextrose 5 % 50 mL IVPB  900 mg Intravenous 60 min Pre-Op Stark Klein, MD       And  . gentamicin (GARAMYCIN) 5 mg/kg in dextrose 5 % 50 mL IVPB  5 mg/kg Intravenous 60 min Pre-Op Stark Klein, MD       Allergies  Allergen Reactions  . Codeine Swelling  . Levofloxacin     Other reaction(s): Malaise (intolerance)  . Penicillins Swelling    Has patient had a PCN reaction causing immediate rash, facial/tongue/throat swelling, SOB or lightheadedness with hypotension:YES Has patient had a PCN reaction causing severe rash involving mucus membranes or skin necrosis: NO Has patient had a PCN reaction that required hospitalization NO Has patient had a PCN reaction occurring within the last 10 years: NO If all of the above answers are "NO", then may proceed with Cephalosporin use.  . Tape Rash    No results found for this or any previous visit (from the past 2160 hour(s)).  Objective: General: Patient is awake, alert, and oriented x 3 and in no acute distress.  Integument: Skin is warm, dry and supple bilateral. Nails are tender, long, thickened and dystrophic with subungual debris, consistent with onychomycosis, 1-5 bilateral.  Minimal reactive keratosis right  first toe.  No signs of infection. No open lesions or preulcerative lesions present bilateral. Remaining integument unremarkable.  Vasculature:  Dorsalis Pedis pulse 1/4 bilateral. Posterior Tibial pulse 0/4 bilateral. Capillary fill time <5 sec 1-5 bilateral.  No hair growth to the level of the digits.Temperature gradient within normal limits.  Moderate varicosities present bilateral.  1+ pitting edema present bilateral.   Neurology: The patient has gross sensation present via light touch bilateral.  Subjective numbness to toes bilateral.  Musculoskeletal: Asymptomatic pes planus deformity.  No tenderness with calf compression bilateral.  Assessment and Plan: Problem List Items Addressed This Visit      Musculoskeletal and Integument   Callus    Other Visit Diagnoses    Pain due to onychomycosis of toenails  of both feet    -  Primary   Pes planus of both feet       Neuritis       Type 2 diabetes mellitus with diabetic neuropathy, unspecified whether long term insulin use ([place])          -Examined patient. -Discussed and educated patient on diabetic foot care, especially with  regards to the vascular, neurological and musculoskeletal systems.  -Stressed the importance of good glycemic control and the detriment of not  controlling glucose levels in relation to the foot. -Mechanically debrided all nails 1-5 bilateral using sterile nail nipper and filed with dremel without incident  -Diabetic shoes dispensed to patient with wear and break-in pattern explained.  Advised patient to call office if there is any issues with rubbing or irritation from her shoes.  Advised patient to closely inspect feet daily and to wear shoes in-house before she tries to wear them out.  Patient was able to ambulate 10 feet without any pain or discomfort and seemed satisfied with her new diabetic shoes. -Answered all patient questions -Patient to return  in 3 months for at risk foot care -Patient advised to call  the office if any problems or questions arise in the meantime.  Landis Martins, DPM

## 2020-08-08 DIAGNOSIS — I509 Heart failure, unspecified: Secondary | ICD-10-CM | POA: Diagnosis not present

## 2020-08-08 DIAGNOSIS — J4520 Mild intermittent asthma, uncomplicated: Secondary | ICD-10-CM | POA: Diagnosis not present

## 2020-08-14 DIAGNOSIS — N390 Urinary tract infection, site not specified: Secondary | ICD-10-CM | POA: Diagnosis not present

## 2020-08-20 DIAGNOSIS — R0789 Other chest pain: Secondary | ICD-10-CM | POA: Diagnosis not present

## 2020-08-20 DIAGNOSIS — R110 Nausea: Secondary | ICD-10-CM | POA: Diagnosis not present

## 2020-08-20 DIAGNOSIS — I2510 Atherosclerotic heart disease of native coronary artery without angina pectoris: Secondary | ICD-10-CM | POA: Diagnosis not present

## 2020-08-20 DIAGNOSIS — R0602 Shortness of breath: Secondary | ICD-10-CM | POA: Diagnosis not present

## 2020-08-20 DIAGNOSIS — I509 Heart failure, unspecified: Secondary | ICD-10-CM | POA: Diagnosis not present

## 2020-08-20 DIAGNOSIS — R079 Chest pain, unspecified: Secondary | ICD-10-CM | POA: Diagnosis not present

## 2020-08-20 DIAGNOSIS — I110 Hypertensive heart disease with heart failure: Secondary | ICD-10-CM | POA: Diagnosis not present

## 2020-08-20 DIAGNOSIS — I517 Cardiomegaly: Secondary | ICD-10-CM | POA: Diagnosis not present

## 2020-08-20 DIAGNOSIS — I7090 Unspecified atherosclerosis: Secondary | ICD-10-CM | POA: Diagnosis not present

## 2020-08-20 DIAGNOSIS — I700 Atherosclerosis of aorta: Secondary | ICD-10-CM | POA: Diagnosis not present

## 2020-08-21 ENCOUNTER — Other Ambulatory Visit: Payer: Self-pay

## 2020-08-21 ENCOUNTER — Telehealth: Payer: Self-pay | Admitting: Cardiology

## 2020-08-21 DIAGNOSIS — I2510 Atherosclerotic heart disease of native coronary artery without angina pectoris: Secondary | ICD-10-CM | POA: Insufficient documentation

## 2020-08-21 NOTE — Telephone Encounter (Signed)
Spoke to patient daughter per dpr. She reports patient had chest pain last night and went to the emergency room. She is still having chest pain in the middle no radiation, no shortness of breath. Patient is weak. Spoke with Dr. Agustin Cree. He would like to see her in [place] tomorrow, got her scheduled for this. No further questions.

## 2020-08-21 NOTE — Telephone Encounter (Signed)
Pt c/o of Chest Pain: STAT if CP now or developed within 24 hours  1. Are you having CP right now? yes  2. Are you experiencing any other symptoms (ex. SOB, nausea, vomiting, sweating)? weak  3. How long have you been experiencing CP? Started yesterday morning  4. Is your CP continuous or coming and going? Coming and going  5. Have you taken Nitroglycerin? Yes, yesterday   Patient's daughter states the patient started having chest pain yesterday morning. She states it stopped that evening and started again this morning. She states the patient did take nitroglycerin yesterday and it did not help so she called the ambulance. She states she was seen in [place] ED. She states today she took tylenol for the chest pain and still has it now.

## 2020-08-22 ENCOUNTER — Other Ambulatory Visit: Payer: Self-pay

## 2020-08-22 ENCOUNTER — Encounter: Payer: Self-pay | Admitting: Cardiology

## 2020-08-22 ENCOUNTER — Ambulatory Visit: Payer: PPO | Admitting: Cardiology

## 2020-08-22 VITALS — BP 134/72 | HR 87 | Ht 66.0 in | Wt 248.0 lb

## 2020-08-22 DIAGNOSIS — I2510 Atherosclerotic heart disease of native coronary artery without angina pectoris: Secondary | ICD-10-CM

## 2020-08-22 DIAGNOSIS — R1310 Dysphagia, unspecified: Secondary | ICD-10-CM | POA: Diagnosis not present

## 2020-08-22 DIAGNOSIS — J410 Simple chronic bronchitis: Secondary | ICD-10-CM | POA: Diagnosis not present

## 2020-08-22 DIAGNOSIS — I5032 Chronic diastolic (congestive) heart failure: Secondary | ICD-10-CM

## 2020-08-22 MED ORDER — NITROGLYCERIN 0.4 MG SL SUBL: 0.4000 mg | SUBLINGUAL_TABLET | SUBLINGUAL | 11 refills | Status: DC | PRN

## 2020-08-22 NOTE — Patient Instructions (Signed)

## 2020-08-22 NOTE — Progress Notes (Signed)
ekg 

## 2020-08-22 NOTE — Progress Notes (Signed)
Cardiology Office Note:    Date:  08/22/2020   ID:  Danielle [name], DOB 08-18-1933, MRN [id number]  PCP:  [name], MD  Cardiologist:  [name], MD    Referring MD: [name], MD   Chief Complaint  Patient presents with  . Chest Pain    History of Present Illness:    Danielle [name] is a 87 y.o. female with past medical history significant for coronary artery disease.  In 2014 she had cardiac catheterization which showed 40% stenosis of the right coronary artery, also diastolic congestive heart failure, type 2 diabetes, chronic respiratory failure on oxygen, COPD, morbid obesity.  She requested to be seen because few days ago she ended up going to the emergency room because of chest pain.  She woke up in the morning started hurting in the chest.  Pain was located in the middle of the chest located somewhat to the back.  Continuous for hours she came to the emergency room she rule out with biochemical markers for myocardial injury, EKG did not show any acute changes, CT of the chest was negative for dissection, negative for PE.  She was discharged home since that time she is doing well denies have any more chest pain tightness squeezing pressure burning chest.  Interestingly she tells me that she was given GI cocktail in the emergency room that gave her perfect relief of her symptomatology.  Past Medical History:  Diagnosis Date  . Anginal pain ([place])   . Arthritis   . Asthma   . At high risk for falls 11/11/2017  . Chest pain 11/09/2015  . CHF (congestive heart failure) ([place])   . Chronic diastolic congestive heart failure ([place]) 11/11/2015  . Chronic insomnia 10/24/2015  . Chronic pain of both knees 05/14/2018  . Chronic respiratory failure with hypoxia ([place]) 05/05/2017  . CKD (chronic kidney disease) stage 3, GFR 30-59 ml/min (HCC) 05/07/2017  . COPD (chronic obstructive pulmonary disease) ([place])   . Coronary artery disease   . Coronary artery disease involving  native coronary artery of native heart without angina pectoris 11/14/2014   Overview:  40% RCA a cardiac catheter in 2014  . Current mild episode of major depressive disorder ([place]) 11/14/2014  . Depression   . DOE (dyspnea on exertion) 09/07/2018  . Dysphagia 08/12/2018  . Edema 08/12/2018  . Elevated blood sugar 11/09/2015  . Emphysema lung ([place])   . Flat feet, bilateral 05/30/2020  . Gastroesophageal reflux disease without esophagitis 11/11/2017  . GERD (gastroesophageal reflux disease)   . Headache   . History of hiatal hernia   . Hyperlipidemia   . Hypertension   . Insomnia   . Internal hemorrhoids 11/26/2018  . Iron deficiency anemia 08/12/2018  . 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/26/2018  . Mesenteric mass 02/19/2015  . Midsternal chest pain 11/11/2015  . Mixed hyperlipidemia 11/14/2014  . Morbid obesity ([place]) 09/07/2018  . OSA (obstructive sleep apnea) 12/16/2019  . Osteoporosis   . Panlobular emphysema ([place]) 11/09/2015  . Pneumonia 2013  . PONV (postoperative nausea and vomiting)   . Positive D-dimer 09/08/2018  . Restless legs syndrome 03/04/2018  . Senile purpura ([place]) 08/12/2018  . Shortness of breath dyspnea    with exertion  . Stasis edema of left lower extremity 04/13/2018  . Strain of right shoulder 08/12/2018  . Type 2 diabetes mellitus with stage 3 chronic kidney disease ([place]) 08/14/2015  . Unstable angina ([place]) 10/30/2018  . Vitamin B12 deficiency 04/13/2018  Past Surgical History:  Procedure Laterality Date  . ABDOMINAL HYSTERECTOMY    . APPENDECTOMY    . BREAST SURGERY  40 yrs. ago   growth removed in each breast-benign  . CARDIAC CATHETERIZATION     12/14/12 LHC ([facility]): few mild stenotic areas max 40% of ectatic RCA o/w NL coronaries, EF 65%. Med tx.  Marland Kitchen CATARACT EXTRACTION W/ INTRAOCULAR LENS  IMPLANT, BILATERAL Bilateral 15 yrs ago  . CHOLECYSTECTOMY    . COLONOSCOPY  04/22/2013   Diverticulosis in the sigmoid colon. Non bleeding internal hemorrhoids. Normal mucosa vascular pattern in  the entire examined colon. Three 6 mm polyps n the descending colon. Resected and retrieved.   Marland Kitchen FOOT SURGERY    . LAPAROSCOPIC APPENDECTOMY N/A 02/19/2015   Procedure: EXCISION OF MESENTERIC MASS;  Surgeon: Stark Klein, MD;  Location: [facility];  Service: General;  Laterality: N/A;  . REPLACEMENT TOTAL KNEE BILATERAL      Current Medications: Current Meds  Medication Sig  . albuterol (PROVENTIL HFA;VENTOLIN HFA) 108 (90 BASE) MCG/ACT inhaler Inhale 2 puffs into the lungs every 6 (six) hours as needed for wheezing or shortness of breath.   Marland Kitchen aspirin 81 MG tablet Take 81 mg by mouth daily.  . budesonide-formoterol (SYMBICORT) 160-4.5 MCG/ACT inhaler Inhale 2 puffs into the lungs 2 (two) times daily.  . citalopram (CELEXA) 20 MG tablet Take 20 mg by mouth daily.  . clonazePAM (KLONOPIN) 1 MG tablet Take 1 mg by mouth as needed (sleep).   . diclofenac Sodium (VOLTAREN) 1 % GEL APPLY 2 GRAMS TOPICALLY TO AFFECTED AREA THREE TIMES DAILY (Patient taking differently: Apply 2 g topically 4 (four) times daily.)  . furosemide (LASIX) 20 MG tablet Take 40 mg by mouth daily.  Marland Kitchen JANUVIA 25 MG tablet Take 1 tablet by mouth daily.  . metFORMIN (GLUCOPHAGE-XR) 500 MG 24 hr tablet Take 500 mg by mouth daily.  . metoprolol succinate (TOPROL-XL) 25 MG 24 hr tablet Take 25 mg by mouth daily.  . nitroGLYCERIN (NITROSTAT) 0.4 MG SL tablet Place 1 tablet (0.4 mg total) under the tongue every 5 (five) minutes as needed for chest pain.  . pantoprazole (PROTONIX) 40 MG tablet Take 40 mg by mouth 2 (two) times daily.  . potassium chloride (MICRO-K) 10 MEQ CR capsule Take 10 mEq by mouth daily.  Marland Kitchen rOPINIRole (REQUIP) 0.25 MG tablet Take 0.25 mg by mouth at bedtime.  . rosuvastatin (CRESTOR) 40 MG tablet Take 40 mg by mouth daily.  Marland Kitchen triamcinolone cream (KENALOG) 0.1 % Apply 1 application topically 2 (two) times daily as needed for rash.  . Vitamin D, Ergocalciferol, (DRISDOL) 50000 UNITS CAPS capsule Take 50,000 Units by  mouth every Sunday. No specific days     Allergies:   Codeine, Levofloxacin, Penicillins, and Tape   Social History   Socioeconomic History  . Marital status: Married    Spouse name: Not on file  . Number of children: Not on file  . Years of education: Not on file  . Highest education level: Not on file  Occupational History  . Not on file  Tobacco Use  . Smoking status: Former Smoker    Packs/day: 0.25    Types: Cigarettes    Quit date: 08/21/2016    Years since quitting: 4.0  . Smokeless tobacco: Never Used  Vaping Use  . Vaping Use: Never used  Substance and Sexual Activity  . Alcohol use: No  . Drug use: No  . Sexual activity: Not on file  Other Topics Concern  . Not on file  Social History Narrative  . Not on file   Social Determinants of Health   Financial Resource Strain: Not on file  Food Insecurity: Not on file  Transportation Needs: Not on file  Physical Activity: Not on file  Stress: Not on file  Social Connections: Not on file     Family History: The patient's Family history is unknown by patient. ROS:   Please see the history of present illness.    All 14 point review of systems negative except as described per history of present illness  EKGs/Labs/Other Studies Reviewed:      Recent Labs: 02/14/2020: BUN 32; Creatinine, Ser 0.98; NT-Pro BNP 272; Potassium 4.3; Sodium 136  Recent Lipid Panel    Component Value Date/Time   CHOL 134 10/22/2018 0952   TRIG 135 10/22/2018 0952   HDL 40 10/22/2018 0952   CHOLHDL 3.4 10/22/2018 0952   LDLCALC 67 10/22/2018 0952    Physical Exam:    VS:  BP 134/72 (BP Location: Right Arm, Patient Position: Sitting)   Pulse 87   Ht 5\' 6"  (1.676 m)   Wt 248 lb (112.5 kg)   SpO2 94%   BMI 40.03 kg/m     Wt Readings from Last 3 Encounters:  08/22/20 248 lb (112.5 kg)  02/14/20 259 lb (117.5 kg)  10/11/19 261 lb (118.4 kg)     GEN:  Well nourished, well developed in no acute distress HEENT:  Normal NECK: No JVD; No carotid bruits LYMPHATICS: No lymphadenopathy CARDIAC: RRR, no murmurs, no rubs, no gallops RESPIRATORY:  Clear to auscultation without rales, wheezing or rhonchi  ABDOMEN: Soft, non-tender, non-distended MUSCULOSKELETAL:  No edema; No deformity  SKIN: Warm and dry LOWER EXTREMITIES: no swelling NEUROLOGIC:  Alert and oriented x 3 PSYCHIATRIC:  Normal affect   ASSESSMENT:    1. Coronary artery disease involving native coronary artery of native heart without angina pectoris   2. Chronic diastolic congestive heart failure ([place])   3. Simple chronic bronchitis ([place])   4. Dysphagia, unspecified type    PLAN:    In order of problems listed above:  1. pain that she suffered from lasted for hours biochemical markers EKG no new changes GI cocktail helped with the pain I suspect we dealing with most likely GI issue.  I suggested to purchase Maalox and Mylanta and use it on as-needed basis also told her that this approach will help with the pain she need to follow-up with her gastroenterologist.  I did offer them stress test however she declined she is reluctant to have it. 2. Chronic diastolic congestive heart failure she appears to be compensated continue present management. 3. COPD with oxygen dependence noted. 4. I did review record from the emergency room for that visit.   Medication Adjustments/Labs and Tests Ordered: Current medicines are reviewed at length with the patient today.  Concerns regarding medicines are outlined above.  No orders of the defined types were placed in this encounter.  Medication changes: No orders of the defined types were placed in this encounter.   Signed, Park Liter, MD, [facility] 08/22/2020 1:39 PM    [facility]

## 2020-08-22 NOTE — Addendum Note (Signed)
Addended by: Senaida Ores on: 08/22/2020 01:44 PM   Modules accepted: Orders

## 2020-09-03 DIAGNOSIS — Z09 Encounter for follow-up examination after completed treatment for conditions other than malignant neoplasm: Secondary | ICD-10-CM | POA: Diagnosis not present

## 2020-09-03 DIAGNOSIS — L84 Corns and callosities: Secondary | ICD-10-CM | POA: Diagnosis not present

## 2020-09-03 DIAGNOSIS — R5381 Other malaise: Secondary | ICD-10-CM | POA: Diagnosis not present

## 2020-09-03 DIAGNOSIS — E1122 Type 2 diabetes mellitus with diabetic chronic kidney disease: Secondary | ICD-10-CM | POA: Diagnosis not present

## 2020-09-03 DIAGNOSIS — I2510 Atherosclerotic heart disease of native coronary artery without angina pectoris: Secondary | ICD-10-CM | POA: Diagnosis not present

## 2020-09-03 DIAGNOSIS — N1831 Chronic kidney disease, stage 3a: Secondary | ICD-10-CM | POA: Diagnosis not present

## 2020-09-03 DIAGNOSIS — I5032 Chronic diastolic (congestive) heart failure: Secondary | ICD-10-CM | POA: Diagnosis not present

## 2020-09-03 DIAGNOSIS — I87302 Chronic venous hypertension (idiopathic) without complications of left lower extremity: Secondary | ICD-10-CM | POA: Diagnosis not present

## 2020-09-03 DIAGNOSIS — F5104 Psychophysiologic insomnia: Secondary | ICD-10-CM | POA: Diagnosis not present

## 2020-09-03 DIAGNOSIS — R5383 Other fatigue: Secondary | ICD-10-CM | POA: Diagnosis not present

## 2020-09-03 DIAGNOSIS — R269 Unspecified abnormalities of gait and mobility: Secondary | ICD-10-CM | POA: Diagnosis not present

## 2020-09-08 DIAGNOSIS — J4520 Mild intermittent asthma, uncomplicated: Secondary | ICD-10-CM | POA: Diagnosis not present

## 2020-09-08 DIAGNOSIS — I509 Heart failure, unspecified: Secondary | ICD-10-CM | POA: Diagnosis not present

## 2020-09-11 DIAGNOSIS — N281 Cyst of kidney, acquired: Secondary | ICD-10-CM | POA: Diagnosis not present

## 2020-09-11 DIAGNOSIS — K5732 Diverticulitis of large intestine without perforation or abscess without bleeding: Secondary | ICD-10-CM | POA: Diagnosis not present

## 2020-09-11 DIAGNOSIS — N200 Calculus of kidney: Secondary | ICD-10-CM | POA: Diagnosis not present

## 2020-09-11 DIAGNOSIS — R1030 Lower abdominal pain, unspecified: Secondary | ICD-10-CM | POA: Diagnosis not present

## 2020-09-11 DIAGNOSIS — N390 Urinary tract infection, site not specified: Secondary | ICD-10-CM | POA: Diagnosis not present

## 2020-09-11 DIAGNOSIS — Z87440 Personal history of urinary (tract) infections: Secondary | ICD-10-CM | POA: Diagnosis not present

## 2020-09-11 DIAGNOSIS — I700 Atherosclerosis of aorta: Secondary | ICD-10-CM | POA: Diagnosis not present

## 2020-09-14 ENCOUNTER — Ambulatory Visit: Payer: PPO | Admitting: Cardiology

## 2020-09-25 ENCOUNTER — Ambulatory Visit: Payer: PPO | Admitting: Cardiology

## 2020-10-08 DIAGNOSIS — Z20822 Contact with and (suspected) exposure to covid-19: Secondary | ICD-10-CM | POA: Diagnosis not present

## 2020-10-08 DIAGNOSIS — I110 Hypertensive heart disease with heart failure: Secondary | ICD-10-CM | POA: Diagnosis not present

## 2020-10-08 DIAGNOSIS — Z9981 Dependence on supplemental oxygen: Secondary | ICD-10-CM | POA: Diagnosis not present

## 2020-10-08 DIAGNOSIS — R0789 Other chest pain: Secondary | ICD-10-CM | POA: Diagnosis not present

## 2020-10-08 DIAGNOSIS — I509 Heart failure, unspecified: Secondary | ICD-10-CM | POA: Diagnosis not present

## 2020-10-08 DIAGNOSIS — R079 Chest pain, unspecified: Secondary | ICD-10-CM | POA: Diagnosis not present

## 2020-10-08 DIAGNOSIS — I517 Cardiomegaly: Secondary | ICD-10-CM | POA: Diagnosis not present

## 2020-10-08 DIAGNOSIS — J449 Chronic obstructive pulmonary disease, unspecified: Secondary | ICD-10-CM | POA: Diagnosis not present

## 2020-10-08 DIAGNOSIS — B349 Viral infection, unspecified: Secondary | ICD-10-CM | POA: Diagnosis not present

## 2020-10-08 DIAGNOSIS — R069 Unspecified abnormalities of breathing: Secondary | ICD-10-CM | POA: Diagnosis not present

## 2020-10-08 DIAGNOSIS — R059 Cough, unspecified: Secondary | ICD-10-CM | POA: Diagnosis not present

## 2020-10-08 DIAGNOSIS — Z79899 Other long term (current) drug therapy: Secondary | ICD-10-CM | POA: Diagnosis not present

## 2020-10-10 ENCOUNTER — Ambulatory Visit: Payer: PPO | Admitting: Sports Medicine

## 2020-10-15 DIAGNOSIS — J9611 Chronic respiratory failure with hypoxia: Secondary | ICD-10-CM | POA: Diagnosis not present

## 2020-10-15 DIAGNOSIS — E1122 Type 2 diabetes mellitus with diabetic chronic kidney disease: Secondary | ICD-10-CM | POA: Diagnosis not present

## 2020-10-15 DIAGNOSIS — J40 Bronchitis, not specified as acute or chronic: Secondary | ICD-10-CM | POA: Diagnosis not present

## 2020-10-15 DIAGNOSIS — I5032 Chronic diastolic (congestive) heart failure: Secondary | ICD-10-CM | POA: Diagnosis not present

## 2020-10-15 DIAGNOSIS — J431 Panlobular emphysema: Secondary | ICD-10-CM | POA: Diagnosis not present

## 2020-10-15 DIAGNOSIS — N1831 Chronic kidney disease, stage 3a: Secondary | ICD-10-CM | POA: Diagnosis not present

## 2020-10-19 DIAGNOSIS — R300 Dysuria: Secondary | ICD-10-CM | POA: Diagnosis not present

## 2020-10-24 ENCOUNTER — Ambulatory Visit: Payer: PPO | Admitting: Sports Medicine

## 2020-10-30 ENCOUNTER — Ambulatory Visit: Payer: PPO | Admitting: Sports Medicine

## 2020-11-16 DIAGNOSIS — G4733 Obstructive sleep apnea (adult) (pediatric): Secondary | ICD-10-CM | POA: Diagnosis not present

## 2020-11-16 DIAGNOSIS — J431 Panlobular emphysema: Secondary | ICD-10-CM | POA: Diagnosis not present

## 2020-11-16 DIAGNOSIS — J9611 Chronic respiratory failure with hypoxia: Secondary | ICD-10-CM | POA: Diagnosis not present

## 2020-11-16 DIAGNOSIS — N183 Chronic kidney disease, stage 3 unspecified: Secondary | ICD-10-CM | POA: Diagnosis not present

## 2020-11-16 DIAGNOSIS — I5032 Chronic diastolic (congestive) heart failure: Secondary | ICD-10-CM | POA: Diagnosis not present

## 2020-11-27 ENCOUNTER — Ambulatory Visit (INDEPENDENT_AMBULATORY_CARE_PROVIDER_SITE_OTHER): Payer: PPO | Admitting: Sports Medicine

## 2020-11-27 ENCOUNTER — Other Ambulatory Visit: Payer: Self-pay

## 2020-11-27 ENCOUNTER — Encounter: Payer: Self-pay | Admitting: Sports Medicine

## 2020-11-27 DIAGNOSIS — M79674 Pain in right toe(s): Secondary | ICD-10-CM | POA: Diagnosis not present

## 2020-11-27 DIAGNOSIS — M2142 Flat foot [pes planus] (acquired), left foot: Secondary | ICD-10-CM

## 2020-11-27 DIAGNOSIS — I739 Peripheral vascular disease, unspecified: Secondary | ICD-10-CM

## 2020-11-27 DIAGNOSIS — M2141 Flat foot [pes planus] (acquired), right foot: Secondary | ICD-10-CM

## 2020-11-27 DIAGNOSIS — B351 Tinea unguium: Secondary | ICD-10-CM

## 2020-11-27 DIAGNOSIS — L84 Corns and callosities: Secondary | ICD-10-CM

## 2020-11-27 DIAGNOSIS — M792 Neuralgia and neuritis, unspecified: Secondary | ICD-10-CM

## 2020-11-27 DIAGNOSIS — M79675 Pain in left toe(s): Secondary | ICD-10-CM | POA: Diagnosis not present

## 2020-11-27 DIAGNOSIS — E1140 Type 2 diabetes mellitus with diabetic neuropathy, unspecified: Secondary | ICD-10-CM

## 2020-11-27 NOTE — Patient Instructions (Signed)
Nervive PM nerve relief supplement for your nerves can be purchased OTC at [facility]

## 2020-11-27 NOTE — Progress Notes (Signed)
Subjective: Danielle [name] is a 87 y.o. female patient with history of diabetes who presents to office today complaining of long,mildly painful nails  while ambulating in shoes; unable to trim. Patient states that the glucose reading this morning was 204 last A1c of 7 and last visit to PCP was 3 months ago.   Patient is assisted by daughter this visit  Patient Active Problem List   Diagnosis Date Noted   Coronary artery disease    PONV (postoperative nausea and vomiting)    Osteoporosis    Insomnia    Hypertension    Hyperlipidemia    History of hiatal hernia    Headache    GERD (gastroesophageal reflux disease)    Emphysema lung (HCC)    COPD (chronic obstructive pulmonary disease) (HCC)    CHF (congestive heart failure) (HCC)    Asthma    Arthritis    Anginal pain ([place])    Callus 05/30/2020   Flat feet, bilateral 05/30/2020   Hammer toe of left foot 05/30/2020   OSA (obstructive sleep apnea) 12/16/2019   Internal hemorrhoids 11/26/2018   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/26/2018   Unstable angina ([place]) 10/30/2018   Positive D-dimer 09/08/2018   DOE (dyspnea on exertion) 09/07/2018   Morbid obesity ([place]) 09/07/2018   Edema 08/12/2018   Iron deficiency anemia 08/12/2018   Senile purpura ([place]) 08/12/2018   Strain of right shoulder 08/12/2018   Dysphagia 08/12/2018   Chronic pain of both knees 05/14/2018   Stasis edema of left lower extremity 04/13/2018   Vitamin B12 deficiency 04/13/2018   Restless legs syndrome 03/04/2018   At high risk for falls 11/11/2017   Gastroesophageal reflux disease without esophagitis 11/11/2017   CKD (chronic kidney disease) stage 3, GFR 30-59 ml/min (HCC) 05/07/2017   Chronic respiratory failure with hypoxia ([place]) 05/05/2017   Midsternal chest pain 11/11/2015   Chronic diastolic congestive heart failure ([place]) 11/11/2015   Chest pain 11/09/2015   Depression 11/09/2015   Panlobular emphysema ([place]) 11/09/2015   Elevated blood sugar 11/09/2015    Chronic insomnia 10/24/2015   Type 2 diabetes mellitus with stage 3 chronic kidney disease ([place]) 08/14/2015   Mesenteric mass 02/19/2015   Coronary artery disease involving native coronary artery of native heart without angina pectoris 11/14/2014   Mixed hyperlipidemia 11/14/2014   Current mild episode of major depressive disorder ([place]) 11/14/2014   Pneumonia 2013   Current Outpatient Medications on File Prior to Visit  Medication Sig Dispense Refill   albuterol (PROVENTIL HFA;VENTOLIN HFA) 108 (90 BASE) MCG/ACT inhaler Inhale 2 puffs into the lungs every 6 (six) hours as needed for wheezing or shortness of breath.      aspirin 81 MG tablet Take 81 mg by mouth daily.     budesonide-formoterol (SYMBICORT) 160-4.5 MCG/ACT inhaler Inhale 2 puffs into the lungs 2 (two) times daily.     citalopram (CELEXA) 20 MG tablet Take 20 mg by mouth daily.     clonazePAM (KLONOPIN) 1 MG tablet Take 1 mg by mouth as needed (sleep).      diclofenac Sodium (VOLTAREN) 1 % GEL APPLY 2 GRAMS TOPICALLY TO AFFECTED AREA THREE TIMES DAILY (Patient taking differently: Apply 2 g topically 4 (four) times daily.) 100 g 0   furosemide (LASIX) 20 MG tablet Take 40 mg by mouth daily.     glimepiride (AMARYL) 4 MG tablet Take 4 mg by mouth daily.     JANUVIA 25 MG tablet Take 1 tablet by mouth daily.  metFORMIN (GLUCOPHAGE-XR) 500 MG 24 hr tablet Take 500 mg by mouth daily.     metoprolol succinate (TOPROL-XL) 25 MG 24 hr tablet Take 25 mg by mouth daily.     nitroGLYCERIN (NITROSTAT) 0.4 MG SL tablet Place 1 tablet (0.4 mg total) under the tongue every 5 (five) minutes as needed for chest pain. 25 tablet 11   pantoprazole (PROTONIX) 40 MG tablet Take 40 mg by mouth 2 (two) times daily.     potassium chloride (MICRO-K) 10 MEQ CR capsule Take 10 mEq by mouth daily.     rOPINIRole (REQUIP) 0.25 MG tablet Take 0.25 mg by mouth at bedtime.     rosuvastatin (CRESTOR) 40 MG tablet Take 40 mg by mouth daily.     triamcinolone  cream (KENALOG) 0.1 % Apply 1 application topically 2 (two) times daily as needed for rash.     Vitamin D, Ergocalciferol, (DRISDOL) 50000 UNITS CAPS capsule Take 50,000 Units by mouth every Sunday. No specific days     Current Facility-Administered Medications on File Prior to Visit  Medication Dose Route Frequency Provider Last Rate Last Admin   clindamycin (CLEOCIN) 900 mg in dextrose 5 % 50 mL IVPB  900 mg Intravenous 60 min Pre-Op Stark Klein, MD       And   gentamicin (GARAMYCIN) 5 mg/kg in dextrose 5 % 50 mL IVPB  5 mg/kg Intravenous 60 min Pre-Op Stark Klein, MD       Allergies  Allergen Reactions   Codeine Swelling   Levofloxacin     Other reaction(s): Malaise (intolerance)   Penicillins Swelling    Has patient had a PCN reaction causing immediate rash, facial/tongue/throat swelling, SOB or lightheadedness with hypotension:YES Has patient had a PCN reaction causing severe rash involving mucus membranes or skin necrosis: NO Has patient had a PCN reaction that required hospitalization NO Has patient had a PCN reaction occurring within the last 10 years: NO If all of the above answers are "NO", then may proceed with Cephalosporin use.   Tape Rash    No results found for this or any previous visit (from the past 2160 hour(s)).  Objective: General: Patient is awake, alert, and oriented x 3 and in no acute distress.  Integument: Skin is warm, dry and supple bilateral. Nails are tender, long, thickened and dystrophic with subungual debris, consistent with onychomycosis, 1-5 bilateral.  Minimal reactive keratosis right first toe.  No signs of infection. No open lesions or preulcerative lesions present bilateral. Remaining integument unremarkable.  Vasculature:  Dorsalis Pedis pulse 1/4 bilateral. Posterior Tibial pulse 0/4 bilateral. Capillary fill time <5 sec 1-5 bilateral.  No hair growth to the level of the digits.Temperature gradient within normal limits.  Moderate varicosities  present bilateral.  1+ pitting edema present bilateral.   Neurology: The patient has gross sensation present via light touch bilateral.  Subjective numbness to toes bilateral.  Musculoskeletal: Asymptomatic pes planus deformity.  No tenderness with calf compression bilateral.  Assessment and Plan: Problem List Items Addressed This Visit       Musculoskeletal and Integument   Callus   Other Visit Diagnoses     Pain due to onychomycosis of toenails of both feet    -  Primary   Pes planus of both feet       Neuritis       PVD (peripheral vascular disease) ([place])       Type 2 diabetes mellitus with diabetic neuropathy, unspecified whether long term insulin use ([place])           -  Examined patient. -Discussed and educated patient on diabetic foot care, especially with  regards to the vascular, neurological and musculoskeletal systems.  -Mechanically debrided all nails 1-5 bilateral using sterile nail nipper and filed with dremel without incident  -Recommend nervive nerve relief PM  -Dispensed Surgigrip compression sleeve to use as instructed for edema control -Advised good supportive shoes daily for foot type to patient's tolerance -Answered all patient questions -Patient to return  in 3 months for at risk foot care -Patient advised to call the office if any problems or questions arise in the meantime.  Landis Martins, DPM

## 2020-12-18 DIAGNOSIS — Z23 Encounter for immunization: Secondary | ICD-10-CM | POA: Diagnosis not present

## 2020-12-18 DIAGNOSIS — Z0000 Encounter for general adult medical examination without abnormal findings: Secondary | ICD-10-CM | POA: Diagnosis not present

## 2020-12-18 DIAGNOSIS — Z9181 History of falling: Secondary | ICD-10-CM | POA: Diagnosis not present

## 2020-12-18 DIAGNOSIS — F330 Major depressive disorder, recurrent, mild: Secondary | ICD-10-CM | POA: Diagnosis not present

## 2020-12-18 DIAGNOSIS — N1831 Chronic kidney disease, stage 3a: Secondary | ICD-10-CM | POA: Diagnosis not present

## 2020-12-18 DIAGNOSIS — G2581 Restless legs syndrome: Secondary | ICD-10-CM | POA: Diagnosis not present

## 2020-12-18 DIAGNOSIS — E1122 Type 2 diabetes mellitus with diabetic chronic kidney disease: Secondary | ICD-10-CM | POA: Diagnosis not present

## 2020-12-18 DIAGNOSIS — R269 Unspecified abnormalities of gait and mobility: Secondary | ICD-10-CM | POA: Diagnosis not present

## 2020-12-18 DIAGNOSIS — M159 Polyosteoarthritis, unspecified: Secondary | ICD-10-CM | POA: Diagnosis not present

## 2020-12-18 DIAGNOSIS — M5136 Other intervertebral disc degeneration, lumbar region: Secondary | ICD-10-CM | POA: Diagnosis not present

## 2020-12-18 DIAGNOSIS — E538 Deficiency of other specified B group vitamins: Secondary | ICD-10-CM | POA: Diagnosis not present

## 2020-12-18 DIAGNOSIS — F5104 Psychophysiologic insomnia: Secondary | ICD-10-CM | POA: Diagnosis not present

## 2020-12-18 DIAGNOSIS — D509 Iron deficiency anemia, unspecified: Secondary | ICD-10-CM | POA: Diagnosis not present

## 2020-12-18 DIAGNOSIS — E782 Mixed hyperlipidemia: Secondary | ICD-10-CM | POA: Diagnosis not present

## 2020-12-24 ENCOUNTER — Encounter: Payer: Self-pay | Admitting: Cardiology

## 2020-12-24 ENCOUNTER — Other Ambulatory Visit: Payer: Self-pay

## 2020-12-24 ENCOUNTER — Ambulatory Visit: Payer: PPO | Admitting: Cardiology

## 2020-12-24 VITALS — BP 110/64 | HR 64 | Ht 63.0 in | Wt 249.0 lb

## 2020-12-24 DIAGNOSIS — I5032 Chronic diastolic (congestive) heart failure: Secondary | ICD-10-CM

## 2020-12-24 DIAGNOSIS — R072 Precordial pain: Secondary | ICD-10-CM | POA: Diagnosis not present

## 2020-12-24 DIAGNOSIS — I10 Essential (primary) hypertension: Secondary | ICD-10-CM | POA: Diagnosis not present

## 2020-12-24 DIAGNOSIS — I2510 Atherosclerotic heart disease of native coronary artery without angina pectoris: Secondary | ICD-10-CM | POA: Diagnosis not present

## 2020-12-24 DIAGNOSIS — J431 Panlobular emphysema: Secondary | ICD-10-CM

## 2020-12-24 NOTE — Patient Instructions (Signed)

## 2020-12-24 NOTE — Progress Notes (Signed)
Cardiology Office Note:    Date:  12/24/2020   ID:  [name], DOB [birth year], MRN [id number]  PCP:  [name], MD  Cardiologist:  [name], MD    Referring MD: [name], MD   Chief Complaint  Patient presents with   Follow-up  Doing well  History of Present Illness:    Danielle [name] is a 87 y.o. female with past medical history significant for coronary artery disease.  In 2014 she had cardiac catheterization which showed 40% stenosis of the right coronary artery.  A lot of problems include diastolic congestive heart failure, morbid obesity, COPD related to chronic smoking, she stopped years ago, type 2 diabetes. She comes today 2 months for follow-up overall she seems to be doing well actually she does have many complaints today denies have any chest pain tightness squeezing pressure mid chest the biggest complaint of the pain in her legs that are hurting all the time.  She also complained of weakness fatigue as well as tiredness while walking.  She does not do much all day she just sits in the chair usually.  Past Medical History:  Diagnosis Date   Anginal pain ([place])    Arthritis    Asthma    At high risk for falls 11/11/2017   Chest pain 11/09/2015   CHF (congestive heart failure) (HCC)    Chronic diastolic congestive heart failure ([place]) 11/11/2015   Chronic insomnia 10/24/2015   Chronic pain of both knees 05/14/2018   Chronic respiratory failure with hypoxia ([place]) 05/05/2017   CKD (chronic kidney disease) stage 3, GFR 30-59 ml/min (HCC) 05/07/2017   COPD (chronic obstructive pulmonary disease) ([place])    Coronary artery disease    Coronary artery disease involving native coronary artery of native heart without angina pectoris 11/14/2014   Overview:  40% RCA a cardiac catheter in 2014   Current mild episode of major depressive disorder ([place]) 11/14/2014   Depression    DOE (dyspnea on exertion) 09/07/2018   Dysphagia 08/12/2018   Edema 08/12/2018    Elevated blood sugar 11/09/2015   Emphysema lung (HCC)    Flat feet, bilateral 05/30/2020   Gastroesophageal reflux disease without esophagitis 11/11/2017   GERD (gastroesophageal reflux disease)    Headache    History of hiatal hernia    Hyperlipidemia    Hypertension    Insomnia    Internal hemorrhoids 11/26/2018   Iron deficiency anemia 08/12/2018   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/26/2018   Mesenteric mass 02/19/2015   Midsternal chest pain 11/11/2015   Mixed hyperlipidemia 11/14/2014   Morbid obesity ([place]) 09/07/2018   OSA (obstructive sleep apnea) 12/16/2019   Osteoporosis    Panlobular emphysema ([place]) 11/09/2015   Pneumonia 2013   PONV (postoperative nausea and vomiting)    Positive D-dimer 09/08/2018   Restless legs syndrome 03/04/2018   Senile purpura ([place]) 08/12/2018   Shortness of breath dyspnea    with exertion   Stasis edema of left lower extremity 04/13/2018   Strain of right shoulder 08/12/2018   Type 2 diabetes mellitus with stage 3 chronic kidney disease ([place]) 08/14/2015   Unstable angina ([place]) 10/30/2018   Vitamin B12 deficiency 04/13/2018    Past Surgical History:  Procedure Laterality Date   ABDOMINAL HYSTERECTOMY     APPENDECTOMY     BREAST SURGERY  40 yrs. ago   growth removed in each breast-benign   CARDIAC CATHETERIZATION     12/14/12 LHC ([facility]): few mild stenotic areas max 40%  of ectatic RCA o/w NL coronaries, EF 65%. Med tx.   CATARACT EXTRACTION W/ INTRAOCULAR LENS  IMPLANT, BILATERAL Bilateral 15 yrs ago   CHOLECYSTECTOMY     COLONOSCOPY  04/22/2013   Diverticulosis in the sigmoid colon. Non bleeding internal hemorrhoids. Normal mucosa vascular pattern in the entire examined colon. Three 6 mm polyps n the descending colon. Resected and retrieved.    FOOT SURGERY     LAPAROSCOPIC APPENDECTOMY N/A 02/19/2015   Procedure: EXCISION OF MESENTERIC MASS;  Surgeon: Stark Klein, MD;  Location: [facility];  Service: General;  Laterality: N/A;   REPLACEMENT TOTAL KNEE BILATERAL      Current  Medications: Current Meds  Medication Sig   albuterol (PROVENTIL HFA;VENTOLIN HFA) 108 (90 BASE) MCG/ACT inhaler Inhale 2 puffs into the lungs every 6 (six) hours as needed for wheezing or shortness of breath.    aspirin 81 MG tablet Take 81 mg by mouth daily.   budesonide-formoterol (SYMBICORT) 160-4.5 MCG/ACT inhaler Inhale 2 puffs into the lungs 2 (two) times daily.   citalopram (CELEXA) 20 MG tablet Take 20 mg by mouth daily.   clonazePAM (KLONOPIN) 1 MG tablet Take 1 mg by mouth as needed for anxiety (sleep).   diclofenac Sodium (VOLTAREN) 1 % GEL APPLY 2 GRAMS TOPICALLY TO AFFECTED AREA THREE TIMES DAILY (Patient taking differently: Apply 2 g topically 4 (four) times daily.)   furosemide (LASIX) 20 MG tablet Take 40 mg by mouth daily.   glimepiride (AMARYL) 4 MG tablet Take 4 mg by mouth daily.   JANUVIA 25 MG tablet Take 1 tablet by mouth daily.   metFORMIN (GLUCOPHAGE-XR) 500 MG 24 hr tablet Take 500 mg by mouth daily.   metoprolol succinate (TOPROL-XL) 25 MG 24 hr tablet Take 25 mg by mouth daily.   nitroGLYCERIN (NITROSTAT) 0.4 MG SL tablet Place 1 tablet (0.4 mg total) under the tongue every 5 (five) minutes as needed for chest pain.   pantoprazole (PROTONIX) 40 MG tablet Take 40 mg by mouth 2 (two) times daily.   potassium chloride (MICRO-K) 10 MEQ CR capsule Take 10 mEq by mouth daily.   rOPINIRole (REQUIP) 0.25 MG tablet Take 0.25 mg by mouth at bedtime.   rosuvastatin (CRESTOR) 40 MG tablet Take 40 mg by mouth daily.   triamcinolone cream (KENALOG) 0.1 % Apply 1 application topically 2 (two) times daily as needed for rash.   Vitamin D, Ergocalciferol, (DRISDOL) 50000 UNITS CAPS capsule Take 50,000 Units by mouth every Sunday. No specific days     Allergies:   Codeine, Levofloxacin, Penicillins, and Tape   Social History   Socioeconomic History   Marital status: Married    Spouse name: Not on file   Number of children: Not on file   Years of education: Not on file    Highest education level: Not on file  Occupational History   Not on file  Tobacco Use   Smoking status: Former    Packs/day: 0.25    Types: Cigarettes    Quit date: 08/21/2016    Years since quitting: 4.3   Smokeless tobacco: Never  Vaping Use   Vaping Use: Never used  Substance and Sexual Activity   Alcohol use: No   Drug use: No   Sexual activity: Not on file  Other Topics Concern   Not on file  Social History Narrative   Not on file   Social Determinants of Health   Financial Resource Strain: Not on file  Food Insecurity: Not on file  Transportation Needs: Not on file  Physical Activity: Not on file  Stress: Not on file  Social Connections: Not on file     Family History: The patient's Family history is unknown by patient. ROS:   Please see the history of present illness.    All 14 point review of systems negative except as described per history of present illness  EKGs/Labs/Other Studies Reviewed:      Recent Labs: 02/14/2020: BUN 32; Creatinine, Ser 0.98; NT-Pro BNP 272; Potassium 4.3; Sodium 136  Recent Lipid Panel    Component Value Date/Time   CHOL 134 10/22/2018 0952   TRIG 135 10/22/2018 0952   HDL 40 10/22/2018 0952   CHOLHDL 3.4 10/22/2018 0952   LDLCALC 67 10/22/2018 0952    Physical Exam:    VS:  BP 110/64 (BP Location: Left Arm, Patient Position: Sitting)   Pulse 64   Ht 5\' 3"  (1.6 m)   Wt 249 lb (112.9 kg)   SpO2 91%   BMI 44.11 kg/m     Wt Readings from Last 3 Encounters:  12/24/20 249 lb (112.9 kg)  08/22/20 248 lb (112.5 kg)  02/14/20 259 lb (117.5 kg)     GEN:  Well nourished, well developed in no acute distress HEENT: Normal NECK: No JVD; No carotid bruits LYMPHATICS: No lymphadenopathy CARDIAC: RRR, no murmurs, no rubs, no gallops RESPIRATORY:  Clear to auscultation without rales, wheezing or rhonchi  ABDOMEN: Soft, non-tender, non-distended MUSCULOSKELETAL:  No edema; No deformity  SKIN: Warm and dry LOWER  EXTREMITIES: no swelling NEUROLOGIC:  Alert and oriented x 3 PSYCHIATRIC:  Normal affect   ASSESSMENT:    1. Coronary artery disease involving native coronary artery of native heart without angina pectoris   2. Primary hypertension   3. Chronic diastolic congestive heart failure (HCC)   4. Panlobular emphysema ([place])   5. Precordial pain    PLAN:    In order of problems listed above:  Coronary disease stable from that point review on appropriate medications we will continue present management. Essential hypertension blood pressure well controlled continue present management Chronic diastolic congestive heart failure compensated COPD likely she does not smoke but probably contributing to her shortness of breath Precordial chest pain: Denies having any now Dyslipidemia I did review her K PN which show me her LDL 67 HDL 40 good cholesterol profile dated from July.  May 2020.  Just few weeks ago she got her annual physical done by her primary care physician her LDL is 88 HDL is 51.  She is on rosuvastatin 40 mg we need to add Zetia to help with her cholesterol.   Medication Adjustments/Labs and Tests Ordered: Current medicines are reviewed at length with the patient today.  Concerns regarding medicines are outlined above.  No orders of the defined types were placed in this encounter.  Medication changes: No orders of the defined types were placed in this encounter.   Signed, Park Liter, MD, [facility] 12/24/2020 2:33 PM    [place]

## 2021-02-21 DIAGNOSIS — I509 Heart failure, unspecified: Secondary | ICD-10-CM | POA: Diagnosis not present

## 2021-02-21 DIAGNOSIS — J4520 Mild intermittent asthma, uncomplicated: Secondary | ICD-10-CM | POA: Diagnosis not present

## 2021-02-27 ENCOUNTER — Encounter: Payer: Self-pay | Admitting: Sports Medicine

## 2021-02-27 ENCOUNTER — Ambulatory Visit: Payer: PPO | Admitting: Sports Medicine

## 2021-02-27 DIAGNOSIS — M79675 Pain in left toe(s): Secondary | ICD-10-CM

## 2021-02-27 DIAGNOSIS — I739 Peripheral vascular disease, unspecified: Secondary | ICD-10-CM

## 2021-02-27 DIAGNOSIS — B351 Tinea unguium: Secondary | ICD-10-CM | POA: Diagnosis not present

## 2021-02-27 DIAGNOSIS — M79674 Pain in right toe(s): Secondary | ICD-10-CM | POA: Diagnosis not present

## 2021-02-27 DIAGNOSIS — E1140 Type 2 diabetes mellitus with diabetic neuropathy, unspecified: Secondary | ICD-10-CM

## 2021-02-27 NOTE — Progress Notes (Signed)
Subjective: Danielle [name] is a 87 y.o. female patient with history of diabetes who presents to office today complaining of long,mildly painful nails  while ambulating in shoes; unable to trim. Patient states that the glucose reading this morning was 204 last A1c of 7 same as previous and last visit to PCP was 3 months ago.   Patient is assisted by daughter this visit  Patient Active Problem List   Diagnosis Date Noted   Coronary artery disease    PONV (postoperative nausea and vomiting)    Osteoporosis    Insomnia    Hypertension    Hyperlipidemia    History of hiatal hernia    Headache    GERD (gastroesophageal reflux disease)    Emphysema lung (HCC)    COPD (chronic obstructive pulmonary disease) (HCC)    CHF (congestive heart failure) (HCC)    Asthma    Arthritis    Anginal pain ([place])    Callus 05/30/2020   Flat feet, bilateral 05/30/2020   Hammer toe of left foot 05/30/2020   OSA (obstructive sleep apnea) 12/16/2019   Internal hemorrhoids 11/26/2018   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/26/2018   Unstable angina (HCC) 10/30/2018   Positive D-dimer 09/08/2018   DOE (dyspnea on exertion) 09/07/2018   Morbid obesity ([place]) 09/07/2018   Edema 08/12/2018   Iron deficiency anemia 08/12/2018   Senile purpura ([place]) 08/12/2018   Strain of right shoulder 08/12/2018   Dysphagia 08/12/2018   Chronic pain of both knees 05/14/2018   Stasis edema of left lower extremity 04/13/2018   Vitamin B12 deficiency 04/13/2018   Restless legs syndrome 03/04/2018   At high risk for falls 11/11/2017   Gastroesophageal reflux disease without esophagitis 11/11/2017   Stage 3a chronic kidney disease ([place]) 05/07/2017   Chronic respiratory failure with hypoxia ([place]) 05/05/2017   Midsternal chest pain 11/11/2015   Chronic diastolic congestive heart failure ([place]) 11/11/2015   Chest pain 11/09/2015   Depression 11/09/2015   Panlobular emphysema ([place]) 11/09/2015   Elevated blood sugar 11/09/2015   Chronic  insomnia 10/24/2015   Type 2 diabetes mellitus with stage 3 chronic kidney disease ([place]) 08/14/2015   Mesenteric mass 02/19/2015   Coronary artery disease involving native coronary artery of native heart without angina pectoris 11/14/2014   Mixed hyperlipidemia 11/14/2014   Current mild episode of major depressive disorder ([place]) 11/14/2014   Pneumonia 2013   Current Outpatient Medications on File Prior to Visit  Medication Sig Dispense Refill   albuterol (PROVENTIL HFA;VENTOLIN HFA) 108 (90 BASE) MCG/ACT inhaler Inhale 2 puffs into the lungs every 6 (six) hours as needed for wheezing or shortness of breath.      aspirin 81 MG tablet Take 81 mg by mouth daily.     budesonide-formoterol (SYMBICORT) 160-4.5 MCG/ACT inhaler Inhale 2 puffs into the lungs 2 (two) times daily.     citalopram (CELEXA) 20 MG tablet Take 20 mg by mouth daily.     clonazePAM (KLONOPIN) 1 MG tablet Take 1 mg by mouth as needed for anxiety (sleep).     diclofenac Sodium (VOLTAREN) 1 % GEL APPLY 2 GRAMS TOPICALLY TO AFFECTED AREA THREE TIMES DAILY (Patient taking differently: Apply 2 g topically 4 (four) times daily.) 100 g 0   furosemide (LASIX) 20 MG tablet Take 40 mg by mouth daily.     glimepiride (AMARYL) 4 MG tablet Take 4 mg by mouth daily.     JANUVIA 25 MG tablet Take 1 tablet by mouth daily.  metFORMIN (GLUCOPHAGE-XR) 500 MG 24 hr tablet Take 500 mg by mouth daily.     metoprolol succinate (TOPROL-XL) 25 MG 24 hr tablet Take 25 mg by mouth daily.     nitroGLYCERIN (NITROSTAT) 0.4 MG SL tablet Place 1 tablet (0.4 mg total) under the tongue every 5 (five) minutes as needed for chest pain. 25 tablet 11   pantoprazole (PROTONIX) 40 MG tablet Take 40 mg by mouth 2 (two) times daily.     potassium chloride (MICRO-K) 10 MEQ CR capsule Take 10 mEq by mouth daily.     rOPINIRole (REQUIP) 0.25 MG tablet Take 0.25 mg by mouth at bedtime.     rosuvastatin (CRESTOR) 40 MG tablet Take 40 mg by mouth daily.      triamcinolone cream (KENALOG) 0.1 % Apply 1 application topically 2 (two) times daily as needed for rash.     Vitamin D, Ergocalciferol, (DRISDOL) 50000 UNITS CAPS capsule Take 50,000 Units by mouth every Sunday. No specific days     Current Facility-Administered Medications on File Prior to Visit  Medication Dose Route Frequency Provider Last Rate Last Admin   clindamycin (CLEOCIN) 900 mg in dextrose 5 % 50 mL IVPB  900 mg Intravenous 60 min Pre-Op Stark Klein, MD       And   gentamicin (GARAMYCIN) 5 mg/kg in dextrose 5 % 50 mL IVPB  5 mg/kg Intravenous 60 min Pre-Op Stark Klein, MD       Allergies  Allergen Reactions   Codeine Swelling   Levofloxacin     Other reaction(s): Malaise (intolerance)   Penicillins Swelling    Has patient had a PCN reaction causing immediate rash, facial/tongue/throat swelling, SOB or lightheadedness with hypotension:YES Has patient had a PCN reaction causing severe rash involving mucus membranes or skin necrosis: NO Has patient had a PCN reaction that required hospitalization NO Has patient had a PCN reaction occurring within the last 10 years: NO If all of the above answers are "NO", then may proceed with Cephalosporin use.   Tape Rash    No results found for this or any previous visit (from the past 2160 hour(s)).  Objective: General: Patient is awake, alert, and oriented x 3 and in no acute distress.  Integument: Skin is warm, dry and supple bilateral. Nails are tender, long, thickened and dystrophic with subungual debris, consistent with onychomycosis, 1-5 bilateral.  Minimal reactive keratosis right first toe and left fifth toe consistent with listers corn.  No signs of infection. No open lesions or preulcerative lesions present bilateral. Remaining integument unremarkable.  Vasculature:  Dorsalis Pedis pulse 1/4 bilateral. Posterior Tibial pulse 0/4 bilateral. Capillary fill time <5 sec 1-5 bilateral.  No hair growth to the level of the  digits.Temperature gradient within normal limits.  Moderate varicosities present bilateral.  1+ pitting edema present bilateral.   Neurology: The patient has gross sensation present via light touch bilateral.  Subjective numbness to toes bilateral.  Musculoskeletal: Asymptomatic pes planus deformity.  No tenderness with calf compression bilateral.  Assessment and Plan: Problem List Items Addressed This Visit   None Visit Diagnoses     Pain due to onychomycosis of toenails of both feet    -  Primary   PVD (peripheral vascular disease) ([place])       Type 2 diabetes mellitus with diabetic neuropathy, unspecified whether long term insulin use ([place])           -Examined patient. -Discussed and educated patient on diabetic foot care, especially with  regards to the vascular, neurological and musculoskeletal systems.  -Mechanically debrided all nails 1-5 bilateral using sterile nail nipper and filed with dremel without incident  -Mechanically debrided corn at left fifth toe using a sterile tissue nipper without incident at no additional charge and debrided reactive keratosis to great toe on the right using a sterile 15 blade at no additional charge. -Advised patient to use Vaseline after bath or shower to dry skin areas -Patient to return  in 3 months for at risk foot care -Patient advised to call the office if any problems or questions arise in the meantime.  Landis Martins, DPM

## 2021-05-29 ENCOUNTER — Ambulatory Visit: Payer: PPO | Admitting: Sports Medicine

## 2021-06-18 ENCOUNTER — Ambulatory Visit: Payer: PPO | Admitting: Sports Medicine

## 2021-06-26 ENCOUNTER — Ambulatory Visit: Payer: PPO | Admitting: Sports Medicine

## 2021-06-26 ENCOUNTER — Encounter: Payer: Self-pay | Admitting: Sports Medicine

## 2021-06-26 DIAGNOSIS — M79675 Pain in left toe(s): Secondary | ICD-10-CM

## 2021-06-26 DIAGNOSIS — B351 Tinea unguium: Secondary | ICD-10-CM | POA: Diagnosis not present

## 2021-06-26 DIAGNOSIS — I739 Peripheral vascular disease, unspecified: Secondary | ICD-10-CM

## 2021-06-26 DIAGNOSIS — M79674 Pain in right toe(s): Secondary | ICD-10-CM

## 2021-06-26 DIAGNOSIS — M2142 Flat foot [pes planus] (acquired), left foot: Secondary | ICD-10-CM

## 2021-06-26 DIAGNOSIS — E1140 Type 2 diabetes mellitus with diabetic neuropathy, unspecified: Secondary | ICD-10-CM

## 2021-06-26 DIAGNOSIS — M2141 Flat foot [pes planus] (acquired), right foot: Secondary | ICD-10-CM

## 2021-06-26 NOTE — Progress Notes (Signed)
Subjective: ?Danielle [name] is a 88 y.o. female patient with history of diabetes who presents to office today complaining of long,mildly painful nails  while ambulating in shoes; unable to trim. Patient states that the glucose reading this morning was not recorded but last visit to PCP Danielle [name]., MD ?was 2 weeks ago. ? ?Patient is assisted by daughter this visit ? ?Patient Active Problem List  ? Diagnosis Date Noted  ? Coronary artery disease   ? PONV (postoperative nausea and vomiting)   ? Osteoporosis   ? Insomnia   ? Hypertension   ? Hyperlipidemia   ? History of hiatal hernia   ? Headache   ? GERD (gastroesophageal reflux disease)   ? Emphysema lung (Danielle [name])   ? COPD (chronic obstructive pulmonary disease) (Danielle [name])   ? CHF (congestive heart failure) (Danielle [name])   ? Asthma   ? Arthritis   ? Anginal pain ([place])   ? Callus 05/30/2020  ? Flat feet, bilateral 05/30/2020  ? Hammer toe of left foot 05/30/2020  ? OSA (obstructive sleep apnea) 12/16/2019  ? Internal hemorrhoids 11/26/2018  ? 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/26/2018  ? Unstable angina (Danielle [name]) 10/30/2018  ? Positive D-dimer 09/08/2018  ? DOE (dyspnea on exertion) 09/07/2018  ? Morbid obesity (Danielle [name]) 09/07/2018  ? Edema 08/12/2018  ? Iron deficiency anemia 08/12/2018  ? Senile purpura (Danielle [name]) 08/12/2018  ? Strain of right shoulder 08/12/2018  ? Dysphagia 08/12/2018  ? Chronic pain of both knees 05/14/2018  ? Stasis edema of left lower extremity 04/13/2018  ? Vitamin B12 deficiency 04/13/2018  ? Restless legs syndrome 03/04/2018  ? At high risk for falls 11/11/2017  ? Gastroesophageal reflux disease without esophagitis 11/11/2017  ? Stage 3a chronic kidney disease (Danielle [name]) 05/07/2017  ? Chronic respiratory failure with hypoxia (Danielle [name]) 05/05/2017  ? Midsternal chest pain 11/11/2015  ? Chronic diastolic congestive heart failure (Danielle [name]) 11/11/2015  ? Chest pain 11/09/2015  ? Depression 11/09/2015  ? Panlobular emphysema (Danielle [name]) 11/09/2015  ? Elevated blood sugar 11/09/2015  ? Chronic  insomnia 10/24/2015  ? Type 2 diabetes mellitus with stage 3 chronic kidney disease (Danielle [name]) 08/14/2015  ? Mesenteric mass 02/19/2015  ? Coronary artery disease involving native coronary artery of native heart without angina pectoris 11/14/2014  ? Mixed hyperlipidemia 11/14/2014  ? Current mild episode of major depressive disorder (Danielle [name]) 11/14/2014  ? Pneumonia 2013  ? ?Current Outpatient Medications on File Prior to Visit  ?Medication Sig Dispense Refill  ? albuterol (PROVENTIL HFA;VENTOLIN HFA) 108 (90 BASE) MCG/ACT inhaler Inhale 2 puffs into the lungs every 6 (six) hours as needed for wheezing or shortness of breath.     ? aspirin 81 MG tablet Take 81 mg by mouth daily.    ? budesonide-formoterol (SYMBICORT) 160-4.5 MCG/ACT inhaler Inhale 2 puffs into the lungs 2 (two) times daily.    ? citalopram (CELEXA) 20 MG tablet Take 20 mg by mouth daily.    ? clonazePAM (KLONOPIN) 1 MG tablet Take 1 mg by mouth as needed for anxiety (sleep).    ? diclofenac Sodium (VOLTAREN) 1 % GEL APPLY 2 GRAMS TOPICALLY TO AFFECTED AREA THREE TIMES DAILY (Patient taking differently: Apply 2 g topically 4 (four) times daily.) 100 g 0  ? furosemide (LASIX) 20 MG tablet Take 40 mg by mouth daily.    ? glimepiride (AMARYL) 4 MG tablet Take 4 mg by mouth daily.    ? JANUVIA 25 MG tablet Take 1 tablet by mouth daily.    ? metFORMIN (GLUCOPHAGE-XR) 500  MG 24 hr tablet Take 500 mg by mouth daily.    ? metoprolol succinate (TOPROL-XL) 25 MG 24 hr tablet Take 25 mg by mouth daily.    ? nitrofurantoin, macrocrystal-monohydrate, (MACROBID) 100 MG capsule Take 100 mg by mouth 2 (two) times daily.    ? nitroGLYCERIN (NITROSTAT) 0.4 MG SL tablet Place 1 tablet (0.4 mg total) under the tongue every 5 (five) minutes as needed for chest pain. 25 tablet 11  ? nitroGLYCERIN (NITROSTAT) 0.4 MG SL tablet Place under the tongue.    ? nystatin cream (MYCOSTATIN) SMARTSIG:1 Topical Twice Daily PRN    ? pantoprazole (PROTONIX) 40 MG tablet Take 40 mg by mouth 2  (two) times daily.    ? potassium chloride (MICRO-K) 10 MEQ CR capsule Take 10 mEq by mouth daily.    ? predniSONE (STERAPRED UNI-PAK 48 TAB) 10 MG (48) TBPK tablet Take by mouth as directed.    ? rOPINIRole (REQUIP) 0.25 MG tablet Take 0.25 mg by mouth at bedtime.    ? rosuvastatin (CRESTOR) 40 MG tablet Take 40 mg by mouth daily.    ? triamcinolone cream (KENALOG) 0.1 % Apply 1 application topically 2 (two) times daily as needed for rash.    ? Vitamin D, Ergocalciferol, (DRISDOL) 50000 UNITS CAPS capsule Take 50,000 Units by mouth every Sunday. No specific days    ? ?Current Facility-Administered Medications on File Prior to Visit  ?Medication Dose Route Frequency Provider Last Rate Last Admin  ? clindamycin (CLEOCIN) 900 mg in dextrose 5 % 50 mL IVPB  900 mg Intravenous 60 min Pre-Op Stark Klein, MD      ? And  ? gentamicin (GARAMYCIN) 5 mg/kg in dextrose 5 % 50 mL IVPB  5 mg/kg Intravenous 60 min Pre-Op Stark Klein, MD      ? ?Allergies  ?Allergen Reactions  ? Codeine Swelling  ? Levofloxacin   ?  Other reaction(s): Malaise (intolerance)  ? Penicillins Swelling  ?  Has patient had a PCN reaction causing immediate rash, facial/tongue/throat swelling, SOB or lightheadedness with hypotension:YES ?Has patient had a PCN reaction causing severe rash involving mucus membranes or skin necrosis: NO ?Has patient had a PCN reaction that required hospitalization NO ?Has patient had a PCN reaction occurring within the last 10 years: NO ?If all of the above answers are "NO", then may proceed with Cephalosporin use.  ? Tape Rash  ? ? ?No results found for this or any previous visit (from the past 2160 hour(s)). ? ?Objective: ?General: Patient is awake, alert, and oriented x 3 and in no acute distress. ? ?Integument: Skin is warm, dry and supple bilateral. Nails are tender, long, thickened and dystrophic with subungual debris, consistent with onychomycosis, 1-5 bilateral.  Minimal listers corn at left fifth toe that is  nonsymptomatic.  No signs of infection. No open lesions or preulcerative lesions present bilateral. Remaining integument unremarkable. ? ?Vasculature:  Dorsalis Pedis pulse 1/4 bilateral. Posterior Tibial pulse 0/4 bilateral. Capillary fill time <5 sec 1-5 bilateral.  No hair growth to the level of the digits.Temperature gradient within normal limits.  Moderate varicosities present bilateral.  1+ pitting edema present bilateral.  ? ?Neurology: The patient has gross sensation present via light touch bilateral.  Subjective numbness to toes bilateral. ? ?Musculoskeletal: Asymptomatic pes planus deformity.  No tenderness with calf compression bilateral. ? ?Assessment and Plan: ?Problem List Items Addressed This Visit   ?None ?Visit Diagnoses   ? ? Pain due to onychomycosis of toenails of both  feet    -  Primary  ? Relevant Medications  ? nitrofurantoin, macrocrystal-monohydrate, (MACROBID) 100 MG capsule  ? nystatin cream (MYCOSTATIN)  ? PVD (peripheral vascular disease) ([place])      ? Relevant Medications  ? nitroGLYCERIN (NITROSTAT) 0.4 MG SL tablet  ? Type 2 diabetes mellitus with diabetic neuropathy, unspecified whether long term insulin use ([place])      ? Pes planus of both feet      ? ?  ? ? ?-Examined patient. ?-Re-Discussed and educated patient on diabetic foot care, especially with  ?regards to the vascular, neurological and musculoskeletal systems.  ?-Mechanically debrided all nails 1-5 bilateral using sterile nail nipper and filed with dremel without incident ?-Patient to return  in 3 months for at risk foot care ?-Patient advised to call the office if any problems or questions arise in the meantime. ? ?Landis Martins, DPM ?

## 2021-06-28 ENCOUNTER — Ambulatory Visit: Payer: PPO | Admitting: Cardiology

## 2021-08-01 ENCOUNTER — Ambulatory Visit: Payer: PPO | Admitting: Cardiology

## 2021-08-06 ENCOUNTER — Other Ambulatory Visit: Payer: Self-pay | Admitting: Cardiology

## 2021-08-06 ENCOUNTER — Ambulatory Visit: Payer: PPO | Admitting: Cardiology

## 2021-08-06 ENCOUNTER — Encounter: Payer: Self-pay | Admitting: Cardiology

## 2021-08-06 VITALS — BP 126/74 | HR 62 | Ht 63.0 in | Wt 246.6 lb

## 2021-08-06 DIAGNOSIS — E782 Mixed hyperlipidemia: Secondary | ICD-10-CM | POA: Diagnosis not present

## 2021-08-06 DIAGNOSIS — R0609 Other forms of dyspnea: Secondary | ICD-10-CM

## 2021-08-06 DIAGNOSIS — I2510 Atherosclerotic heart disease of native coronary artery without angina pectoris: Secondary | ICD-10-CM | POA: Diagnosis not present

## 2021-08-06 DIAGNOSIS — G4733 Obstructive sleep apnea (adult) (pediatric): Secondary | ICD-10-CM

## 2021-08-06 DIAGNOSIS — I5032 Chronic diastolic (congestive) heart failure: Secondary | ICD-10-CM

## 2021-08-06 NOTE — Patient Instructions (Signed)
Medication Instructions:  Your physician recommends that you continue on your current medications as directed. Please refer to the Current Medication list given to you today.  *If you need a refill on your cardiac medications before your next appointment, please call your pharmacy*   Lab Work: BMP, ProBNP- Today If you have labs (blood work) drawn today and your tests are completely normal, you will receive your results only by: MyChart Message (if you have MyChart) OR A paper copy in the mail If you have any lab test that is abnormal or we need to change your treatment, we will call you to review the results.   Testing/Procedures: Your physician has requested that you have an echocardiogram. Echocardiography is a painless test that uses sound waves to create images of your heart. It provides your doctor with information about the size and shape of your heart and how well your heart's chambers and valves are working. This procedure takes approximately one hour. There are no restrictions for this procedure.    Follow-Up: At [facility], you and your health needs are our priority.  As part of our continuing mission to provide you with exceptional heart care, we have created designated Provider Care Teams.  These Care Teams include your primary Cardiologist (physician) and Advanced Practice Providers (APPs -  Physician Assistants and Nurse Practitioners) who all work together to provide you with the care you need, when you need it.  We recommend signing up for the patient portal called "MyChart".  Sign up information is provided on this After Visit Summary.  MyChart is used to connect with patients for Virtual Visits (Telemedicine).  Patients are able to view lab/test results, encounter notes, upcoming appointments, etc.  Non-urgent messages can be sent to your provider as well.   To learn more about what you can do with MyChart, go to [email or web address].    Your next appointment:   6  month(s)  The format for your next appointment:   In Person  Provider:   Robert Krasowski, MD    Other Instructions NA  

## 2021-08-06 NOTE — Addendum Note (Signed)
Addended by: Jacobo Forest D on: 08/06/2021 01:34 PM ? ? Modules accepted: Orders ? ?

## 2021-08-06 NOTE — Progress Notes (Signed)
?Cardiology Office Note:   ? ?Date:  08/06/2021  ? ?ID:  Danielle [name], DOB 11-Jul-1933, MRN [id number] ? ?PCP:  [name], MD  ?Cardiologist:  [name], MD   ? ?Referring MD: [name], MD  ? ?Chief Complaint  ?Patient presents with  ? Shortness of Breath  ?   ?  ? Cough  ? ? ?History of Present Illness:   ? ?Danielle [name] is a 88 y.o. female   with past medical history significant for coronary artery disease.  In 2014 she had cardiac catheterization which showed 40% stenosis of the right coronary artery.  A lot of problems include diastolic congestive heart failure, morbid obesity, COPD related to chronic smoking, she stopped years ago, type 2 diabetes. ?She is coming to my office for follow-up overall she is doing very well.  She still gets some shortness of breath cough which is relatively stable pattern.  She is being taken care of by pulmonologist.  The question is here as part of it related to her heart.  Denies have any chest pain tightness squeezing pressure burning chest otherwise ? ?Past Medical History:  ?Diagnosis Date  ? Anginal pain ([place])   ? Arthritis   ? Asthma   ? At high risk for falls 11/11/2017  ? Chest pain 11/09/2015  ? CHF (congestive heart failure) ([place])   ? Chronic diastolic congestive heart failure ([place]) 11/11/2015  ? Chronic insomnia 10/24/2015  ? Chronic pain of both knees 05/14/2018  ? Chronic respiratory failure with hypoxia ([place]) 05/05/2017  ? CKD (chronic kidney disease) stage 3, GFR 30-59 ml/min ([facility]) 05/07/2017  ? COPD (chronic obstructive pulmonary disease) ([place])   ? Coronary artery disease   ? Coronary artery disease involving native coronary artery of native heart without angina pectoris 11/14/2014  ? Overview:  40% RCA a cardiac catheter in 2014  ? Current mild episode of major depressive disorder ([place]) 11/14/2014  ? Depression   ? DOE (dyspnea on exertion) 09/07/2018  ? Dysphagia 08/12/2018  ? Edema 08/12/2018  ? Elevated blood sugar 11/09/2015  ? Emphysema lung  ([place])   ? Flat feet, bilateral 05/30/2020  ? Gastroesophageal reflux disease without esophagitis 11/11/2017  ? GERD (gastroesophageal reflux disease)   ? Headache   ? History of hiatal hernia   ? Hyperlipidemia   ? Hypertension   ? Insomnia   ? Internal hemorrhoids 11/26/2018  ? Iron deficiency anemia 08/12/2018  ? 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/26/2018  ? Mesenteric mass 02/19/2015  ? Midsternal chest pain 11/11/2015  ? Mixed hyperlipidemia 11/14/2014  ? Morbid obesity ([place]) 09/07/2018  ? OSA (obstructive sleep apnea) 12/16/2019  ? Osteoporosis   ? Panlobular emphysema ([place]) 11/09/2015  ? Pneumonia 2013  ? PONV (postoperative nausea and vomiting)   ? Positive D-dimer 09/08/2018  ? Restless legs syndrome 03/04/2018  ? Senile purpura ([place]) 08/12/2018  ? Shortness of breath dyspnea   ? with exertion  ? Stasis edema of left lower extremity 04/13/2018  ? Strain of right shoulder 08/12/2018  ? Type 2 diabetes mellitus with stage 3 chronic kidney disease ([place]) 08/14/2015  ? Unstable angina ([place]) 10/30/2018  ? Vitamin B12 deficiency 04/13/2018  ? ? ?Past Surgical History:  ?Procedure Laterality Date  ? ABDOMINAL HYSTERECTOMY    ? APPENDECTOMY    ? BREAST SURGERY  40 yrs. ago  ? growth removed in each breast-benign  ? CARDIAC CATHETERIZATION    ? 12/14/12 LHC ([facility]): few mild stenotic areas max 40% of  ectatic RCA o/w NL coronaries, EF 65%. Med tx.  ? CATARACT EXTRACTION W/ INTRAOCULAR LENS  IMPLANT, BILATERAL Bilateral 15 yrs ago  ? CHOLECYSTECTOMY    ? COLONOSCOPY  04/22/2013  ? Diverticulosis in the sigmoid colon. Non bleeding internal hemorrhoids. Normal mucosa vascular pattern in the entire examined colon. Three 6 mm polyps n the descending colon. Resected and retrieved.   ? FOOT SURGERY    ? LAPAROSCOPIC APPENDECTOMY N/A 02/19/2015  ? Procedure: EXCISION OF MESENTERIC MASS;  Surgeon: Stark Klein, MD;  Location: [facility];  Service: General;  Laterality: N/A;  ? REPLACEMENT TOTAL KNEE BILATERAL    ? ? ?Current Medications: ?Current Meds  ?Medication Sig  ?  albuterol (PROVENTIL HFA;VENTOLIN HFA) 108 (90 BASE) MCG/ACT inhaler Inhale 2 puffs into the lungs every 6 (six) hours as needed for wheezing or shortness of breath.   ? aspirin 81 MG tablet Take 81 mg by mouth daily.  ? budesonide-formoterol (SYMBICORT) 160-4.5 MCG/ACT inhaler Inhale 2 puffs into the lungs 2 (two) times daily.  ? citalopram (CELEXA) 20 MG tablet Take 20 mg by mouth daily.  ? clonazePAM (KLONOPIN) 1 MG tablet Take 1 mg by mouth as needed for anxiety (sleep).  ? diclofenac Sodium (VOLTAREN) 1 % GEL APPLY 2 GRAMS TOPICALLY TO AFFECTED AREA THREE TIMES DAILY (Patient taking differently: Apply 2 g topically 4 (four) times daily.)  ? furosemide (LASIX) 20 MG tablet Take 40 mg by mouth daily.  ? glimepiride (AMARYL) 4 MG tablet Take 4 mg by mouth daily.  ? JANUVIA 25 MG tablet Take 1 tablet by mouth daily.  ? metFORMIN (GLUCOPHAGE-XR) 500 MG 24 hr tablet Take 500 mg by mouth daily.  ? metoprolol succinate (TOPROL-XL) 25 MG 24 hr tablet Take 25 mg by mouth daily.  ? nitroGLYCERIN (NITROSTAT) 0.4 MG SL tablet Place 1 tablet (0.4 mg total) under the tongue every 5 (five) minutes as needed for chest pain.  ? pantoprazole (PROTONIX) 40 MG tablet Take 40 mg by mouth 2 (two) times daily.  ? potassium chloride (MICRO-K) 10 MEQ CR capsule Take 10 mEq by mouth daily.  ? rOPINIRole (REQUIP) 0.25 MG tablet Take 0.25 mg by mouth at bedtime.  ? rosuvastatin (CRESTOR) 40 MG tablet Take 40 mg by mouth daily.  ? Vitamin D, Ergocalciferol, (DRISDOL) 50000 UNITS CAPS capsule Take 50,000 Units by mouth every Sunday. No specific days  ?  ? ?Allergies:   Codeine, Levofloxacin, Penicillins, and Tape  ? ?Social History  ? ?Socioeconomic History  ? Marital status: Married  ?  Spouse name: Not on file  ? Number of children: Not on file  ? Years of education: Not on file  ? Highest education level: Not on file  ?Occupational History  ? Not on file  ?Tobacco Use  ? Smoking status: Former  ?  Packs/day: 0.25  ?  Types: Cigarettes  ?   Quit date: 08/21/2016  ?  Years since quitting: 4.9  ? Smokeless tobacco: Never  ?Vaping Use  ? Vaping Use: Never used  ?Substance and Sexual Activity  ? Alcohol use: No  ? Drug use: No  ? Sexual activity: Not on file  ?Other Topics Concern  ? Not on file  ?Social History Narrative  ? Not on file  ? ?Social Determinants of Health  ? ?Financial Resource Strain: Not on file  ?Food Insecurity: Not on file  ?Transportation Needs: Not on file  ?Physical Activity: Not on file  ?Stress: Not on file  ?Social Connections:  Not on file  ?  ? ?Family History: ?The patient's Family history is unknown by patient. ?ROS:   ?Please see the history of present illness.    ?All 14 point review of systems negative except as described per history of present illness ? ?EKGs/Labs/Other Studies Reviewed:   ? ? ? ?Recent Labs: ?No results found for requested labs within last 8760 hours.  ?Recent Lipid Panel ?   ?Component Value Date/Time  ? CHOL 134 10/22/2018 0952  ? TRIG 135 10/22/2018 0952  ? HDL 40 10/22/2018 0952  ? CHOLHDL 3.4 10/22/2018 0952  ? [place] 67 10/22/2018 0952  ? ? ?Physical Exam:   ? ?VS:  BP 126/74 (BP Location: Right Arm, Patient Position: Sitting)   Pulse 62   Ht '5\' 3"'$  (1.6 m)   Wt 246 lb 9.6 oz (111.9 kg)   SpO2 97%   BMI 43.68 kg/m?    ? ?Wt Readings from Last 3 Encounters:  ?08/06/21 246 lb 9.6 oz (111.9 kg)  ?12/24/20 249 lb (112.9 kg)  ?08/22/20 248 lb (112.5 kg)  ?  ? ?GEN:  Well nourished, well developed in no acute distress ?HEENT: Normal ?NECK: No JVD; No carotid bruits ?LYMPHATICS: No lymphadenopathy ?CARDIAC: RRR, no murmurs, no rubs, no gallops ?RESPIRATORY:  Clear to auscultation without rales, wheezing or rhonchi  ?ABDOMEN: Soft, non-tender, non-distended ?MUSCULOSKELETAL:  No edema; No deformity  ?SKIN: Warm and dry ?LOWER EXTREMITIES: no swelling ?NEUROLOGIC:  Alert and oriented x 3 ?PSYCHIATRIC:  Normal affect  ? ?ASSESSMENT:   ? ?1. Coronary artery disease involving native coronary artery of native  heart without angina pectoris   ?2. Chronic diastolic congestive heart failure ([place])   ?3. OSA (obstructive sleep apnea)   ?4. Mixed hyperlipidemia   ? ?PLAN:   ? ?In order of problems listed above: ? ?Co

## 2021-08-07 ENCOUNTER — Other Ambulatory Visit: Payer: PPO

## 2021-08-07 LAB — BASIC METABOLIC PANEL
BUN/Creatinine Ratio: 27 (ref 12–28)
BUN: 20 mg/dL (ref 8–27)
CO2: 23 mmol/L (ref 20–29)
Calcium: 9.8 mg/dL (ref 8.7–10.3)
Chloride: 99 mmol/L (ref 96–106)
Creatinine, Ser: 0.74 mg/dL (ref 0.57–1.00)
Glucose: 101 mg/dL — ABNORMAL HIGH (ref 70–99)
Potassium: 4.4 mmol/L (ref 3.5–5.2)
Sodium: 139 mmol/L (ref 134–144)
eGFR: 79 mL/min/{1.73_m2} (ref >59)

## 2021-08-07 LAB — PRO B NATRIURETIC PEPTIDE: NT-Pro BNP: 198 pg/mL (ref 0–738)

## 2021-09-21 DIAGNOSIS — J189 Pneumonia, unspecified organism: Secondary | ICD-10-CM

## 2021-09-23 ENCOUNTER — Ambulatory Visit: Payer: PPO | Admitting: Podiatry

## 2021-10-15 ENCOUNTER — Ambulatory Visit: Payer: PPO | Admitting: Podiatrist

## 2021-11-05 ENCOUNTER — Ambulatory Visit: Payer: PPO | Admitting: Podiatrist

## 2021-11-15 ENCOUNTER — Encounter: Payer: Self-pay | Admitting: Gastroenterology

## 2021-11-20 ENCOUNTER — Ambulatory Visit: Payer: PPO | Admitting: Podiatrist

## 2021-11-20 DIAGNOSIS — M79674 Pain in right toe(s): Secondary | ICD-10-CM | POA: Diagnosis not present

## 2021-11-20 DIAGNOSIS — M79675 Pain in left toe(s): Secondary | ICD-10-CM | POA: Diagnosis not present

## 2021-11-20 DIAGNOSIS — B351 Tinea unguium: Secondary | ICD-10-CM

## 2021-11-20 NOTE — Progress Notes (Signed)
Subjective: Danielle [name] is a 88 y.o. female patient with history of diabetes who presents to office today with a chief concern of long,mildly painful nails  while ambulating in shoes; unable to trim.  Patient denies any new cramping, numbness, burning or tingling in the legs.   [name], MD  is her primary care provider.  Past medical history, meds and allergies are reviewed.   Objective: General: Patient is awake, alert, and oriented x 3 and in no acute distress.   Integument: Skin is warm, dry and supple bilateral. Nails are tender, long, thickened and dystrophic with subungual debris, consistent with onychomycosis, 1-5 bilateral.  Minimal listers corn at left fifth toe that is nonsymptomatic.  No signs of infection. No open lesions or preulcerative lesions present bilateral. Remaining integument unremarkable.   Vasculature:  Dorsalis Pedis pulse 1/4 bilateral. Posterior Tibial pulse 0/4 bilateral. Capillary fill time <5 sec 1-5 bilateral.  No hair growth to the level of the digits.Temperature gradient within normal limits.  Moderate varicosities present bilateral.  1+ pitting edema present bilateral.    Neurology: The patient has gross sensation present via light touch bilateral.  Subjective numbness to toes bilateral.   Musculoskeletal: Asymptomatic pes planus deformity.  No tenderness with calf compression bilateral.   Assessment and Plan:   ICD-10-CM   1. Pain due to onychomycosis of toenails of both feet  B35.1    M79.675    M79.674        -Examined patient. -Discussed and educated patient on diabetic foot care, especially with  regards to the vascular, neurological and musculoskeletal systems.  -Stressed the importance of good glycemic control and the detriment of not  controlling glucose levels in relation to the foot. -Mechanically debrided all nails 1-5 bilateral using sterile nail nipper and filed with dremel without incident  -Answered all patient  questions -Patient to return  in 3 months for at risk foot care -Patient advised to call the office if any problems or questions arise in the meantime.  Bronson Ing, DPM

## 2021-12-30 ENCOUNTER — Telehealth: Payer: Self-pay | Admitting: Cardiology

## 2021-12-30 ENCOUNTER — Telehealth: Payer: Self-pay

## 2021-12-30 NOTE — Telephone Encounter (Signed)
Spoke with daughter per DPR- She stated that the pt's weight had gone up 3 lbs, she was having some shortness of breath when up moving around and her lower legs were swollen. She is on Lasix '40mg'$  and was instructed by the Pulmonary Doctor that she could give her an extra '20mg'$  if needed for swelling, shortness of breath or weight gain. She has had '20mg'$  a day extra x 3 days. The daughter stated that her weight came down but she still has leg swelling and shortness of breath. Her other medications are the same.

## 2021-12-30 NOTE — Telephone Encounter (Signed)
Pt c/o swelling: STAT is pt has developed SOB within 24 hours  If swelling, where is the swelling located? Knees and down   How much weight have you gained and in what time span? N/a Have you gained 3 pounds in a day or 5 pounds in a week? N/a  Do you have a log of your daily weights (if so, list)? 246 - Saturday  242 - today   Are you currently taking a fluid pill? Yes when her weight fluctuates daughter gives her an extra fluid pill   Are you currently SOB? No   Have you traveled recently? No

## 2022-01-03 NOTE — Telephone Encounter (Signed)
Follow Up:    Patient's daughter is calling back to see what was decided.

## 2022-01-03 NOTE — Telephone Encounter (Signed)
Spoke with daughter about previous message. She stated that the pt's legs are still swollen and she is having some shortness of breath. Advised that wee would make an appt for her to be seen and for her to contact her PCP for an office visit if needed until the appt with Dr. Agustin Cree. Encouraged her to go to the ER of her symptoms worsened or persists. Daughter agreed and had no further questions.

## 2022-01-17 ENCOUNTER — Ambulatory Visit: Payer: PPO | Attending: Cardiology | Admitting: Cardiology

## 2022-01-17 ENCOUNTER — Encounter: Payer: Self-pay | Admitting: Cardiology

## 2022-01-17 VITALS — BP 138/60 | HR 64 | Ht 63.0 in | Wt 245.8 lb

## 2022-01-17 DIAGNOSIS — I10 Essential (primary) hypertension: Secondary | ICD-10-CM | POA: Diagnosis not present

## 2022-01-17 DIAGNOSIS — R0609 Other forms of dyspnea: Secondary | ICD-10-CM

## 2022-01-17 DIAGNOSIS — I2510 Atherosclerotic heart disease of native coronary artery without angina pectoris: Secondary | ICD-10-CM | POA: Diagnosis not present

## 2022-01-17 DIAGNOSIS — N1832 Chronic kidney disease, stage 3b: Secondary | ICD-10-CM

## 2022-01-17 DIAGNOSIS — J9611 Chronic respiratory failure with hypoxia: Secondary | ICD-10-CM

## 2022-01-17 DIAGNOSIS — I5032 Chronic diastolic (congestive) heart failure: Secondary | ICD-10-CM | POA: Diagnosis not present

## 2022-01-17 DIAGNOSIS — E1122 Type 2 diabetes mellitus with diabetic chronic kidney disease: Secondary | ICD-10-CM

## 2022-01-17 MED ORDER — RANOLAZINE ER 500 MG PO TB12: 500.0000 mg | ORAL_TABLET | Freq: Two times a day (BID) | ORAL | 3 refills | Status: DC

## 2022-01-17 NOTE — Patient Instructions (Addendum)
Medication Instructions:  Your physician has recommended you make the following change in your medication:   START: Ranolazine '500mg'$  1 tablet twice daily    Lab Work: Troponin, BMP, ProBNP- today If you have labs (blood work) drawn today and your tests are completely normal, you will receive your results only by: [place] (if you have MyChart) OR A paper copy in the mail If you have any lab test that is abnormal or we need to change your treatment, we will call you to review the results.   Testing/Procedures: Your physician has requested that you have an echocardiogram. Echocardiography is a painless test that uses sound waves to create images of your heart. It provides your doctor with information about the size and shape of your heart and how well your heart's chambers and valves are working. This procedure takes approximately one hour. There are no restrictions for this procedure. Please do NOT wear cologne, perfume, aftershave, or lotions (deodorant is allowed). Please arrive 15 minutes prior to your appointment time.    Follow-Up: At [facility], you and your health needs are our priority.  As part of our continuing mission to provide you with exceptional heart care, we have created designated Provider Care Teams.  These Care Teams include your primary Cardiologist (physician) and Advanced Practice Providers (APPs -  Physician Assistants and Nurse Practitioners) who all work together to provide you with the care you need, when you need it.  We recommend signing up for the patient portal called "MyChart".  Sign up information is provided on this After Visit Summary.  MyChart is used to connect with patients for Virtual Visits (Telemedicine).  Patients are able to view lab/test results, encounter notes, upcoming appointments, etc.  Non-urgent messages can be sent to your provider as well.   To learn more about what you can do with MyChart, go to [email or web address].    Your  next appointment:   3 month(s)  The format for your next appointment:   In Person  Provider:   [name], MD    Other Instructions NA

## 2022-01-17 NOTE — Progress Notes (Signed)
Cardiology Office Note:    Date:  01/17/2022   ID:  Danielle [name], DOB 05-17-1933, MRN [id number]  PCP:  [name], MD  Cardiologist:  [name], MD    Referring MD: [name], MD   Chief Complaint  Patient presents with   Weight Gain    Edema ongoing for a month    History of Present Illness:    Danielle [name] is a 88 y.o. female    with past medical history significant for coronary artery disease.  In 2014 she had cardiac catheterization which showed 40% stenosis of the right coronary artery.  A lot of problems include diastolic congestive heart failure, morbid obesity, COPD related to chronic smoking, she stopped years ago, type 2 diabetes She comes today 2 months of follow-up with a lot of complaints she complain of being short of breath she complained of having chest pain however have difficulty describing it she localizes it to the left side of his chest which is not related to exercise lasting all day so maybe every few days.  Palpation of the area does not make pain worse.  She also complained of having swelling of lower extremities.  Denies have any palpitations no dizziness no passing out.  Past Medical History:  Diagnosis Date   Anginal pain ([place])    Arthritis    Asthma    At high risk for falls 11/11/2017   Chest pain 11/09/2015   CHF (congestive heart failure) (HCC)    Chronic diastolic congestive heart failure ([place]) 11/11/2015   Chronic insomnia 10/24/2015   Chronic pain of both knees 05/14/2018   Chronic respiratory failure with hypoxia ([place]) 05/05/2017   CKD (chronic kidney disease) stage 3, GFR 30-59 ml/min (HCC) 05/07/2017   COPD (chronic obstructive pulmonary disease) ([place])    Coronary artery disease    Coronary artery disease involving native coronary artery of native heart without angina pectoris 11/14/2014   Overview:  40% RCA a cardiac catheter in 2014   Current mild episode of major depressive disorder ([place]) 11/14/2014   Depression     DOE (dyspnea on exertion) 09/07/2018   Dysphagia 08/12/2018   Edema 08/12/2018   Elevated blood sugar 11/09/2015   Emphysema lung (HCC)    Flat feet, bilateral 05/30/2020   Gastroesophageal reflux disease without esophagitis 11/11/2017   GERD (gastroesophageal reflux disease)    Headache    History of hiatal hernia    Hyperlipidemia    Hypertension    Insomnia    Internal hemorrhoids 11/26/2018   Iron deficiency anemia 08/12/2018   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/26/2018   Mesenteric mass 02/19/2015   Midsternal chest pain 11/11/2015   Mixed hyperlipidemia 11/14/2014   Morbid obesity ([place]) 09/07/2018   OSA (obstructive sleep apnea) 12/16/2019   Osteoporosis    Panlobular emphysema ([place]) 11/09/2015   Pneumonia 2013   PONV (postoperative nausea and vomiting)    Positive D-dimer 09/08/2018   Restless legs syndrome 03/04/2018   Senile purpura ([place]) 08/12/2018   Shortness of breath dyspnea    with exertion   Stasis edema of left lower extremity 04/13/2018   Strain of right shoulder 08/12/2018   Type 2 diabetes mellitus with stage 3 chronic kidney disease ([place]) 08/14/2015   Unstable angina ([place]) 10/30/2018   Vitamin B12 deficiency 04/13/2018    Past Surgical History:  Procedure Laterality Date   ABDOMINAL HYSTERECTOMY     APPENDECTOMY     BREAST SURGERY  40 yrs. ago   growth  removed in each breast-benign   CARDIAC CATHETERIZATION     12/14/12 LHC ([facility]): few mild stenotic areas max 40% of ectatic RCA o/w NL coronaries, EF 65%. Med tx.   CATARACT EXTRACTION W/ INTRAOCULAR LENS  IMPLANT, BILATERAL Bilateral 15 yrs ago   CHOLECYSTECTOMY     COLONOSCOPY  04/22/2013   Diverticulosis in the sigmoid colon. Non bleeding internal hemorrhoids. Normal mucosa vascular pattern in the entire examined colon. Three 6 mm polyps n the descending colon. Resected and retrieved.    FOOT SURGERY     LAPAROSCOPIC APPENDECTOMY N/A 02/19/2015   Procedure: EXCISION OF MESENTERIC MASS;  Surgeon: Stark Klein, MD;  Location: [facility];   Service: General;  Laterality: N/A;   REPLACEMENT TOTAL KNEE BILATERAL      Current Medications: Current Meds  Medication Sig   albuterol (PROVENTIL HFA;VENTOLIN HFA) 108 (90 BASE) MCG/ACT inhaler Inhale 2 puffs into the lungs every 6 (six) hours as needed for wheezing or shortness of breath.    aspirin 81 MG tablet Take 81 mg by mouth daily.   budesonide-formoterol (SYMBICORT) 160-4.5 MCG/ACT inhaler Inhale 2 puffs into the lungs 2 (two) times daily.   citalopram (CELEXA) 20 MG tablet Take 20 mg by mouth daily.   clonazePAM (KLONOPIN) 1 MG tablet Take 1 mg by mouth as needed for anxiety (sleep).   diclofenac Sodium (VOLTAREN) 1 % GEL APPLY 2 GRAMS TOPICALLY TO AFFECTED AREA THREE TIMES DAILY (Patient taking differently: Apply 2 g topically 4 (four) times daily.)   furosemide (LASIX) 20 MG tablet Take 40 mg by mouth daily.   glimepiride (AMARYL) 4 MG tablet Take 4 mg by mouth daily.   JANUVIA 25 MG tablet Take 100 mg by mouth daily.   metFORMIN (GLUCOPHAGE-XR) 500 MG 24 hr tablet Take 500 mg by mouth daily.   metoprolol succinate (TOPROL-XL) 25 MG 24 hr tablet Take 25 mg by mouth daily.   nitroGLYCERIN (NITROSTAT) 0.4 MG SL tablet Place 1 tablet (0.4 mg total) under the tongue every 5 (five) minutes as needed for chest pain.   NYSTATIN powder Apply topically 3 (three) times daily.   pantoprazole (PROTONIX) 40 MG tablet Take 40 mg by mouth 2 (two) times daily.   potassium chloride (MICRO-K) 10 MEQ CR capsule Take 10 mEq by mouth daily.   ranolazine (RANEXA) 500 MG 12 hr tablet Take 1 tablet (500 mg total) by mouth 2 (two) times daily.   rOPINIRole (REQUIP) 0.25 MG tablet Take 0.25 mg by mouth at bedtime.   rosuvastatin (CRESTOR) 40 MG tablet Take 40 mg by mouth daily.   Vitamin D, Ergocalciferol, (DRISDOL) 50000 UNITS CAPS capsule Take 50,000 Units by mouth every Sunday. No specific days     Allergies:   Codeine, Levofloxacin, Penicillins, and Tape   Social History   Socioeconomic  History   Marital status: Married    Spouse name: Not on file   Number of children: Not on file   Years of education: Not on file   Highest education level: Not on file  Occupational History   Not on file  Tobacco Use   Smoking status: Former    Packs/day: 0.25    Types: Cigarettes    Quit date: 08/21/2016    Years since quitting: 5.4   Smokeless tobacco: Never  Vaping Use   Vaping Use: Never used  Substance and Sexual Activity   Alcohol use: No   Drug use: No   Sexual activity: Not on file  Other Topics Concern  Not on file  Social History Narrative   Not on file   Social Determinants of Health   Financial Resource Strain: Not on file  Food Insecurity: Not on file  Transportation Needs: Not on file  Physical Activity: Not on file  Stress: Not on file  Social Connections: Not on file     Family History: The patient's Family history is unknown by patient. ROS:   Please see the history of present illness.    All 14 point review of systems negative except as described per history of present illness  EKGs/Labs/Other Studies Reviewed:      Recent Labs: 08/06/2021: BUN 20; Creatinine, Ser 0.74; NT-Pro BNP 198; Potassium 4.4; Sodium 139  Recent Lipid Panel    Component Value Date/Time   CHOL 134 10/22/2018 0952   TRIG 135 10/22/2018 0952   HDL 40 10/22/2018 0952   CHOLHDL 3.4 10/22/2018 0952   LDLCALC 67 10/22/2018 0952    Physical Exam:    VS:  BP 138/60 (BP Location: Left Arm, Patient Position: Sitting)   Pulse 64   Ht '5\' 3"'$  (1.6 m)   Wt 245 lb 12.8 oz (111.5 kg)   SpO2 94%   BMI 43.54 kg/m     Wt Readings from Last 3 Encounters:  01/17/22 245 lb 12.8 oz (111.5 kg)  08/06/21 246 lb 9.6 oz (111.9 kg)  12/24/20 249 lb (112.9 kg)     GEN:  Well nourished, well developed in no acute distress HEENT: Normal NECK: No JVD; No carotid bruits LYMPHATICS: No lymphadenopathy CARDIAC: RRR, no murmurs, no rubs, no gallops RESPIRATORY:  Clear to auscultation  without rales, wheezing or rhonchi  ABDOMEN: Soft, non-tender, non-distended MUSCULOSKELETAL:  No edema; No deformity  SKIN: Warm and dry LOWER EXTREMITIES: no swelling NEUROLOGIC:  Alert and oriented x 3 PSYCHIATRIC:  Normal affect   ASSESSMENT:    1. Dyspnea on exertion   2. Primary hypertension   3. Coronary artery disease involving native coronary artery of native heart without angina pectoris   4. Chronic diastolic congestive heart failure ([place])   5. Chronic respiratory failure with hypoxia (HCC)   6. Type 2 diabetes mellitus with stage 3b chronic kidney disease, unspecified whether long term insulin use (HCC)    PLAN:    In order of problems listed above:  Coronary disease however 40% stenosis only on the right coronary artery based on cardiac catheterization from years ago, she does have some symptoms which are difficult to elicit from her story.  Check her EKG which shows no acute ST segment changes I will ask her to have troponin I, echocardiogram will be done.  I will put her on ranolazine 500 twice daily to see if that helps. Swelling of lower extremities: Echocardiogram performed to check left ventricle ejection fraction.  I will also ask her to have Chem-7 as well as proBNP to guide as we do therapy for this problem. Chronic respiratory failure secondary to long-term smoking, likely she quit.  Clearly contributing to his symptomatology Type 2 diabetes seems to be doing well from that point review   Medication Adjustments/Labs and Tests Ordered: Current medicines are reviewed at length with the patient today.  Concerns regarding medicines are outlined above.  Orders Placed This Encounter  Procedures   Pro b natriuretic peptide (BNP)   Basic metabolic panel   Troponin T   EKG 12-Lead   ECHOCARDIOGRAM COMPLETE   Medication changes:  Meds ordered this encounter  Medications   ranolazine (RANEXA)  500 MG 12 hr tablet    Sig: Take 1 tablet (500 mg total) by mouth 2  (two) times daily.    Dispense:  180 tablet    Refill:  3    Signed, Park Liter, MD, [facility] 01/17/2022 5:14 PM    [facility]

## 2022-01-18 LAB — TROPONIN T: Troponin T (Highly Sensitive): 10 ng/L (ref 0–14)

## 2022-01-22 LAB — BASIC METABOLIC PANEL
BUN/Creatinine Ratio: 25 (ref 12–28)
BUN: 19 mg/dL (ref 8–27)
CO2: 26 mmol/L (ref 20–29)
Calcium: 9.3 mg/dL (ref 8.7–10.3)
Chloride: 99 mmol/L (ref 96–106)
Creatinine, Ser: 0.75 mg/dL (ref 0.57–1.00)
Glucose: 117 mg/dL — ABNORMAL HIGH (ref 70–99)
Potassium: 4.5 mmol/L (ref 3.5–5.2)
Sodium: 139 mmol/L (ref 134–144)
eGFR: 77 mL/min/{1.73_m2} (ref >59)

## 2022-01-22 LAB — PRO B NATRIURETIC PEPTIDE: NT-Pro BNP: 154 pg/mL (ref 0–738)

## 2022-01-28 ENCOUNTER — Telehealth: Payer: Self-pay | Admitting: Cardiology

## 2022-01-28 ENCOUNTER — Telehealth: Payer: Self-pay

## 2022-01-28 MED ORDER — FUROSEMIDE 20 MG PO TABS: 60.0000 mg | ORAL_TABLET | Freq: Every day | ORAL | 1 refills | Status: DC

## 2022-01-28 NOTE — Telephone Encounter (Signed)
-----   Message from Park Liter, MD sent at 01/23/2022  6:14 PM EDT ----- Her blood test show normal kidney function normal potassium interestingly her proBNP is absolutely perfectly normal.  It looks like she takes 40 mg of Lasix every day if this is what she was thinking so far I propose to increase to 60 mg daily if during the office visit we increased the dose from 20-40 I will keep you do it is

## 2022-01-28 NOTE — Telephone Encounter (Signed)
Spoke with [name], notified of results and recommendations and agreed with plan. Rx sent with new instruction. Danielle [name] prefers '20mg'$  x3 tabs at this time. I confirmed she was already on '40mg'$  daily.

## 2022-01-28 NOTE — Telephone Encounter (Signed)
Pt c/o medication issue:  1. Name of Medication:   ranolazine (RANEXA) 500 MG 12 hr tablet  2. How are you currently taking this medication (dosage and times per day)?   As prescribed  3. Are you having a reaction (difficulty breathing--STAT)?   4. What is your medication issue?   Daughter stated patient wants to stop taking this medication at night and only take it once daily as she believes this medication is making her unable to sleep at night.

## 2022-01-29 MED ORDER — RANOLAZINE ER 500 MG PO TB12: 500.0000 mg | ORAL_TABLET | Freq: Every morning | ORAL | 2 refills | Status: DC

## 2022-01-29 NOTE — Telephone Encounter (Signed)
Spoke with [name] notified of the following per Dr. Raliegh Ip. Med list up to date

## 2022-02-04 ENCOUNTER — Ambulatory Visit: Payer: PPO | Attending: Cardiology

## 2022-02-04 DIAGNOSIS — R0609 Other forms of dyspnea: Secondary | ICD-10-CM

## 2022-02-05 LAB — ECHOCARDIOGRAM COMPLETE
Area-P 1/2: 2.97 cm2
S' Lateral: 3.5 cm

## 2022-02-12 ENCOUNTER — Telehealth: Payer: Self-pay | Admitting: Cardiology

## 2022-02-12 NOTE — Telephone Encounter (Signed)
Park Liter, MD 02/06/2022 12:31 PM EST     Echocardiogram showed normal left ventricle ejection fraction, overall looks good   Spoke with [name], patient's daughter per [facility] and reviewed results per Dr. Wendy Poet note above.  She had no additional questions and thanked me for the call.

## 2022-02-12 NOTE — Telephone Encounter (Signed)
Patient's daughter called for her echo results.

## 2022-02-17 ENCOUNTER — Telehealth: Payer: Self-pay

## 2022-02-17 NOTE — Telephone Encounter (Signed)
Results reviewed with Danielle [name]'s daughter as per Dr. Wendy Poet note.  Danielle [name]'s daughter verbalized understanding and had no additional questions. Routed to PCP

## 2022-02-26 ENCOUNTER — Other Ambulatory Visit: Payer: Self-pay

## 2022-02-26 ENCOUNTER — Emergency Department (HOSPITAL_COMMUNITY): Payer: PPO

## 2022-02-26 ENCOUNTER — Inpatient Hospital Stay (HOSPITAL_COMMUNITY)
Admission: EM | Admit: 2022-02-26 | Discharge: 2022-03-01 | DRG: 308 | Disposition: A | Discharge status: Home Health | Payer: PPO | Attending: Internal Medicine | Admitting: Internal Medicine

## 2022-02-26 ENCOUNTER — Encounter (HOSPITAL_COMMUNITY): Payer: Self-pay | Admitting: *Deleted

## 2022-02-26 DIAGNOSIS — R072 Precordial pain: Secondary | ICD-10-CM | POA: Diagnosis present

## 2022-02-26 DIAGNOSIS — I5033 Acute on chronic diastolic (congestive) heart failure: Secondary | ICD-10-CM | POA: Diagnosis present

## 2022-02-26 DIAGNOSIS — Z9181 History of falling: Secondary | ICD-10-CM

## 2022-02-26 DIAGNOSIS — Z9841 Cataract extraction status, right eye: Secondary | ICD-10-CM

## 2022-02-26 DIAGNOSIS — J42 Unspecified chronic bronchitis: Secondary | ICD-10-CM | POA: Diagnosis present

## 2022-02-26 DIAGNOSIS — Z7982 Long term (current) use of aspirin: Secondary | ICD-10-CM

## 2022-02-26 DIAGNOSIS — E782 Mixed hyperlipidemia: Secondary | ICD-10-CM | POA: Diagnosis present

## 2022-02-26 DIAGNOSIS — Z9981 Dependence on supplemental oxygen: Secondary | ICD-10-CM

## 2022-02-26 DIAGNOSIS — Z6841 Body Mass Index (BMI) 40.0 and over, adult: Secondary | ICD-10-CM

## 2022-02-26 DIAGNOSIS — J9621 Acute and chronic respiratory failure with hypoxia: Secondary | ICD-10-CM | POA: Diagnosis present

## 2022-02-26 DIAGNOSIS — G8929 Other chronic pain: Secondary | ICD-10-CM | POA: Diagnosis present

## 2022-02-26 DIAGNOSIS — M25561 Pain in right knee: Secondary | ICD-10-CM | POA: Diagnosis present

## 2022-02-26 DIAGNOSIS — E1169 Type 2 diabetes mellitus with other specified complication: Secondary | ICD-10-CM | POA: Diagnosis present

## 2022-02-26 DIAGNOSIS — M2142 Flat foot [pes planus] (acquired), left foot: Secondary | ICD-10-CM | POA: Diagnosis present

## 2022-02-26 DIAGNOSIS — Z7951 Long term (current) use of inhaled steroids: Secondary | ICD-10-CM

## 2022-02-26 DIAGNOSIS — Z8249 Family history of ischemic heart disease and other diseases of the circulatory system: Secondary | ICD-10-CM

## 2022-02-26 DIAGNOSIS — R1310 Dysphagia, unspecified: Secondary | ICD-10-CM | POA: Diagnosis present

## 2022-02-26 DIAGNOSIS — J189 Pneumonia, unspecified organism: Secondary | ICD-10-CM

## 2022-02-26 DIAGNOSIS — E876 Hypokalemia: Secondary | ICD-10-CM | POA: Diagnosis not present

## 2022-02-26 DIAGNOSIS — Z881 Allergy status to other antibiotic agents status: Secondary | ICD-10-CM

## 2022-02-26 DIAGNOSIS — Z7984 Long term (current) use of oral hypoglycemic drugs: Secondary | ICD-10-CM

## 2022-02-26 DIAGNOSIS — I4719 Other supraventricular tachycardia: Secondary | ICD-10-CM | POA: Insufficient documentation

## 2022-02-26 DIAGNOSIS — G4733 Obstructive sleep apnea (adult) (pediatric): Secondary | ICD-10-CM | POA: Diagnosis present

## 2022-02-26 DIAGNOSIS — E1165 Type 2 diabetes mellitus with hyperglycemia: Secondary | ICD-10-CM | POA: Diagnosis present

## 2022-02-26 DIAGNOSIS — K219 Gastro-esophageal reflux disease without esophagitis: Secondary | ICD-10-CM | POA: Diagnosis not present

## 2022-02-26 DIAGNOSIS — M1990 Unspecified osteoarthritis, unspecified site: Secondary | ICD-10-CM | POA: Diagnosis present

## 2022-02-26 DIAGNOSIS — E538 Deficiency of other specified B group vitamins: Secondary | ICD-10-CM | POA: Diagnosis present

## 2022-02-26 DIAGNOSIS — J441 Chronic obstructive pulmonary disease with (acute) exacerbation: Secondary | ICD-10-CM | POA: Diagnosis not present

## 2022-02-26 DIAGNOSIS — I130 Hypertensive heart and chronic kidney disease with heart failure and stage 1 through stage 4 chronic kidney disease, or unspecified chronic kidney disease: Secondary | ICD-10-CM | POA: Diagnosis present

## 2022-02-26 DIAGNOSIS — Z9049 Acquired absence of other specified parts of digestive tract: Secondary | ICD-10-CM

## 2022-02-26 DIAGNOSIS — F5104 Psychophysiologic insomnia: Secondary | ICD-10-CM | POA: Diagnosis present

## 2022-02-26 DIAGNOSIS — Z96653 Presence of artificial knee joint, bilateral: Secondary | ICD-10-CM | POA: Diagnosis present

## 2022-02-26 DIAGNOSIS — E1122 Type 2 diabetes mellitus with diabetic chronic kidney disease: Secondary | ICD-10-CM | POA: Diagnosis present

## 2022-02-26 DIAGNOSIS — M2141 Flat foot [pes planus] (acquired), right foot: Secondary | ICD-10-CM | POA: Diagnosis present

## 2022-02-26 DIAGNOSIS — Z87891 Personal history of nicotine dependence: Secondary | ICD-10-CM

## 2022-02-26 DIAGNOSIS — Z8719 Personal history of other diseases of the digestive system: Secondary | ICD-10-CM

## 2022-02-26 DIAGNOSIS — Z20822 Contact with and (suspected) exposure to covid-19: Secondary | ICD-10-CM | POA: Diagnosis present

## 2022-02-26 DIAGNOSIS — Z880 Allergy status to penicillin: Secondary | ICD-10-CM

## 2022-02-26 DIAGNOSIS — Z8701 Personal history of pneumonia (recurrent): Secondary | ICD-10-CM

## 2022-02-26 DIAGNOSIS — D509 Iron deficiency anemia, unspecified: Secondary | ICD-10-CM | POA: Diagnosis present

## 2022-02-26 DIAGNOSIS — Z9842 Cataract extraction status, left eye: Secondary | ICD-10-CM

## 2022-02-26 DIAGNOSIS — Z90710 Acquired absence of both cervix and uterus: Secondary | ICD-10-CM

## 2022-02-26 DIAGNOSIS — R079 Chest pain, unspecified: Secondary | ICD-10-CM | POA: Diagnosis present

## 2022-02-26 DIAGNOSIS — E871 Hypo-osmolality and hyponatremia: Secondary | ICD-10-CM | POA: Diagnosis present

## 2022-02-26 DIAGNOSIS — M810 Age-related osteoporosis without current pathological fracture: Secondary | ICD-10-CM | POA: Diagnosis present

## 2022-02-26 DIAGNOSIS — Z86010 Personal history of colonic polyps: Secondary | ICD-10-CM

## 2022-02-26 DIAGNOSIS — Z79899 Other long term (current) drug therapy: Secondary | ICD-10-CM

## 2022-02-26 DIAGNOSIS — I2510 Atherosclerotic heart disease of native coronary artery without angina pectoris: Secondary | ICD-10-CM | POA: Diagnosis present

## 2022-02-26 DIAGNOSIS — Z961 Presence of intraocular lens: Secondary | ICD-10-CM | POA: Diagnosis present

## 2022-02-26 DIAGNOSIS — K648 Other hemorrhoids: Secondary | ICD-10-CM | POA: Diagnosis present

## 2022-02-26 DIAGNOSIS — Z91048 Other nonmedicinal substance allergy status: Secondary | ICD-10-CM

## 2022-02-26 DIAGNOSIS — Z7952 Long term (current) use of systemic steroids: Secondary | ICD-10-CM

## 2022-02-26 DIAGNOSIS — M25562 Pain in left knee: Secondary | ICD-10-CM | POA: Diagnosis present

## 2022-02-26 DIAGNOSIS — D735 Infarction of spleen: Secondary | ICD-10-CM | POA: Diagnosis present

## 2022-02-26 DIAGNOSIS — N1831 Chronic kidney disease, stage 3a: Secondary | ICD-10-CM | POA: Diagnosis present

## 2022-02-26 DIAGNOSIS — J9611 Chronic respiratory failure with hypoxia: Secondary | ICD-10-CM | POA: Diagnosis present

## 2022-02-26 DIAGNOSIS — Z87440 Personal history of urinary (tract) infections: Secondary | ICD-10-CM

## 2022-02-26 DIAGNOSIS — I480 Paroxysmal atrial fibrillation: Principal | ICD-10-CM | POA: Diagnosis present

## 2022-02-26 DIAGNOSIS — G2581 Restless legs syndrome: Secondary | ICD-10-CM | POA: Diagnosis present

## 2022-02-26 DIAGNOSIS — I10 Essential (primary) hypertension: Secondary | ICD-10-CM | POA: Diagnosis present

## 2022-02-26 DIAGNOSIS — K5792 Diverticulitis of intestine, part unspecified, without perforation or abscess without bleeding: Secondary | ICD-10-CM | POA: Diagnosis present

## 2022-02-26 DIAGNOSIS — F419 Anxiety disorder, unspecified: Secondary | ICD-10-CM | POA: Diagnosis present

## 2022-02-26 DIAGNOSIS — Z885 Allergy status to narcotic agent status: Secondary | ICD-10-CM

## 2022-02-26 DIAGNOSIS — J9600 Acute respiratory failure, unspecified whether with hypoxia or hypercapnia: Principal | ICD-10-CM

## 2022-02-26 DIAGNOSIS — N183 Chronic kidney disease, stage 3 unspecified: Secondary | ICD-10-CM | POA: Diagnosis present

## 2022-02-26 DIAGNOSIS — F329 Major depressive disorder, single episode, unspecified: Secondary | ICD-10-CM | POA: Diagnosis present

## 2022-02-26 DIAGNOSIS — I4892 Unspecified atrial flutter: Secondary | ICD-10-CM | POA: Diagnosis present

## 2022-02-26 DIAGNOSIS — J449 Chronic obstructive pulmonary disease, unspecified: Secondary | ICD-10-CM | POA: Diagnosis present

## 2022-02-26 DIAGNOSIS — J431 Panlobular emphysema: Secondary | ICD-10-CM | POA: Diagnosis present

## 2022-02-26 LAB — CBC WITH DIFFERENTIAL/PLATELET
Abs Immature Granulocytes: 0.01 10*3/uL (ref 0.00–0.07)
Basophils Absolute: 0.1 10*3/uL (ref 0.0–0.1)
Basophils Relative: 1 %
Eosinophils Absolute: 0.1 10*3/uL (ref 0.0–0.5)
Eosinophils Relative: 1 %
HCT: 39.3 % (ref 36.0–46.0)
Hemoglobin: 12.6 g/dL (ref 12.0–15.0)
Immature Granulocytes: 0 %
Lymphocytes Relative: 20 %
Lymphs Abs: 2.1 10*3/uL (ref 0.7–4.0)
MCH: 28.1 pg (ref 26.0–34.0)
MCHC: 32.1 g/dL (ref 30.0–36.0)
MCV: 87.7 fL (ref 80.0–100.0)
Monocytes Absolute: 0.8 10*3/uL (ref 0.1–1.0)
Monocytes Relative: 7 %
Neutro Abs: 7.5 10*3/uL (ref 1.7–7.7)
Neutrophils Relative %: 71 %
Platelets: 241 10*3/uL (ref 150–400)
RBC: 4.48 MIL/uL (ref 3.87–5.11)
RDW: 14.1 % (ref 11.5–15.5)
WBC: 10.5 10*3/uL (ref 4.0–10.5)
nRBC: 0 % (ref 0.0–0.2)

## 2022-02-26 LAB — URINALYSIS, ROUTINE W REFLEX MICROSCOPIC
Bacteria, UA: NONE SEEN
Bilirubin Urine: NEGATIVE
Glucose, UA: NEGATIVE mg/dL
Hgb urine dipstick: NEGATIVE
Ketones, ur: NEGATIVE mg/dL
Nitrite: NEGATIVE
Protein, ur: NEGATIVE mg/dL
Specific Gravity, Urine: 1.008 (ref 1.005–1.030)
pH: 5 (ref 5.0–8.0)

## 2022-02-26 LAB — RESP PANEL BY RT-PCR (FLU A&B, COVID) ARPGX2
Influenza A by PCR: NEGATIVE
Influenza B by PCR: NEGATIVE
SARS Coronavirus 2 by RT PCR: NEGATIVE

## 2022-02-26 LAB — COMPREHENSIVE METABOLIC PANEL
ALT: 19 U/L (ref 0–44)
AST: 27 U/L (ref 15–41)
Albumin: 3.8 g/dL (ref 3.5–5.0)
Alkaline Phosphatase: 56 U/L (ref 38–126)
Anion gap: 15 (ref 5–15)
BUN: 20 mg/dL (ref 8–23)
CO2: 24 mmol/L (ref 22–32)
Calcium: 8.9 mg/dL (ref 8.9–10.3)
Chloride: 98 mmol/L (ref 98–111)
Creatinine, Ser: 0.82 mg/dL (ref 0.44–1.00)
GFR, Estimated: >60 mL/min (ref >60)
Glucose, Bld: 249 mg/dL — ABNORMAL HIGH (ref 70–99)
Potassium: 3.6 mmol/L (ref 3.5–5.1)
Sodium: 137 mmol/L (ref 135–145)
Total Bilirubin: 0.3 mg/dL (ref 0.3–1.2)
Total Protein: 6.2 g/dL — ABNORMAL LOW (ref 6.5–8.1)

## 2022-02-26 LAB — TROPONIN I (HIGH SENSITIVITY)
Troponin I (High Sensitivity): 30 ng/L — ABNORMAL HIGH (ref <18)
Troponin I (High Sensitivity): 43 ng/L — ABNORMAL HIGH (ref <18)

## 2022-02-26 LAB — BRAIN NATRIURETIC PEPTIDE: B Natriuretic Peptide: 59.7 pg/mL (ref 0.0–100.0)

## 2022-02-26 MED ORDER — LEVALBUTEROL HCL 0.63 MG/3ML IN NEBU
0.6300 mg | INHALATION_SOLUTION | Freq: Four times a day (QID) | RESPIRATORY_TRACT | Status: DC
  Administered 2022-02-27 (×4): 0.63 mg via RESPIRATORY_TRACT
  Filled 2022-02-26 (×4): qty 3

## 2022-02-26 MED ORDER — IPRATROPIUM-ALBUTEROL 0.5-2.5 (3) MG/3ML IN SOLN
3.0000 mL | Freq: Once | RESPIRATORY_TRACT | Status: AC
  Administered 2022-02-26: 3 mL via RESPIRATORY_TRACT
  Filled 2022-02-26: qty 3

## 2022-02-26 MED ORDER — INSULIN ASPART 100 UNIT/ML IJ SOLN
0.0000 [IU] | Freq: Three times a day (TID) | INTRAMUSCULAR | Status: DC
  Administered 2022-02-27 (×3): 15 [IU] via SUBCUTANEOUS
  Administered 2022-02-27: 11 [IU] via SUBCUTANEOUS
  Administered 2022-02-28: 5 [IU] via SUBCUTANEOUS
  Administered 2022-02-28: 11 [IU] via SUBCUTANEOUS
  Administered 2022-02-28: 8 [IU] via SUBCUTANEOUS
  Administered 2022-03-01 (×2): 11 [IU] via SUBCUTANEOUS

## 2022-02-26 MED ORDER — ACETAMINOPHEN 325 MG PO TABS
650.0000 mg | ORAL_TABLET | Freq: Four times a day (QID) | ORAL | Status: DC | PRN
  Administered 2022-02-28 – 2022-03-01 (×2): 650 mg via ORAL
  Filled 2022-02-26 (×2): qty 2

## 2022-02-26 MED ORDER — DOXYCYCLINE HYCLATE 100 MG PO TABS
100.0000 mg | ORAL_TABLET | Freq: Two times a day (BID) | ORAL | Status: DC
  Administered 2022-02-27: 100 mg via ORAL
  Filled 2022-02-26: qty 1

## 2022-02-26 MED ORDER — IOHEXOL 350 MG/ML SOLN
75.0000 mL | Freq: Once | INTRAVENOUS | Status: AC | PRN
  Administered 2022-02-26: 75 mL via INTRAVENOUS

## 2022-02-26 MED ORDER — METOPROLOL SUCCINATE ER 25 MG PO TB24
25.0000 mg | ORAL_TABLET | Freq: Every day | ORAL | Status: DC
  Administered 2022-02-27: 25 mg via ORAL
  Filled 2022-02-26: qty 1

## 2022-02-26 MED ORDER — IPRATROPIUM BROMIDE 0.02 % IN SOLN
0.5000 mg | Freq: Four times a day (QID) | RESPIRATORY_TRACT | Status: DC
  Administered 2022-02-27 (×4): 0.5 mg via RESPIRATORY_TRACT
  Filled 2022-02-26 (×4): qty 2.5

## 2022-02-26 MED ORDER — ASPIRIN 81 MG PO TBEC
81.0000 mg | DELAYED_RELEASE_TABLET | Freq: Every day | ORAL | Status: DC
  Administered 2022-02-27 – 2022-03-01 (×3): 81 mg via ORAL
  Filled 2022-02-26 (×3): qty 1

## 2022-02-26 MED ORDER — METHYLPREDNISOLONE SODIUM SUCC 125 MG IJ SOLR
125.0000 mg | Freq: Once | INTRAMUSCULAR | Status: AC
  Administered 2022-02-26: 125 mg via INTRAVENOUS
  Filled 2022-02-26: qty 2

## 2022-02-26 MED ORDER — CITALOPRAM HYDROBROMIDE 20 MG PO TABS
20.0000 mg | ORAL_TABLET | Freq: Every day | ORAL | Status: DC
  Administered 2022-02-27 – 2022-03-01 (×3): 20 mg via ORAL
  Filled 2022-02-26 (×2): qty 1
  Filled 2022-02-26: qty 2

## 2022-02-26 MED ORDER — ROSUVASTATIN CALCIUM 20 MG PO TABS
40.0000 mg | ORAL_TABLET | Freq: Every day | ORAL | Status: DC
  Administered 2022-02-27 – 2022-03-01 (×3): 40 mg via ORAL
  Filled 2022-02-26 (×3): qty 2

## 2022-02-26 MED ORDER — METOPROLOL TARTRATE 5 MG/5ML IV SOLN
5.0000 mg | Freq: Once | INTRAVENOUS | Status: AC
  Administered 2022-02-26: 5 mg via INTRAVENOUS
  Filled 2022-02-26: qty 5

## 2022-02-26 MED ORDER — ROPINIROLE HCL 0.5 MG PO TABS
0.2500 mg | ORAL_TABLET | Freq: Every day | ORAL | Status: DC
  Administered 2022-02-27 – 2022-02-28 (×3): 0.25 mg via ORAL
  Filled 2022-02-26 (×4): qty 1

## 2022-02-26 MED ORDER — FENTANYL CITRATE PF 50 MCG/ML IJ SOSY
50.0000 ug | PREFILLED_SYRINGE | Freq: Once | INTRAMUSCULAR | Status: AC
  Administered 2022-02-26: 50 ug via INTRAVENOUS
  Filled 2022-02-26: qty 1

## 2022-02-26 MED ORDER — ONDANSETRON HCL 4 MG PO TABS: 4.0000 mg | ORAL_TABLET | Freq: Four times a day (QID) | ORAL | Status: DC | PRN

## 2022-02-26 MED ORDER — PREDNISONE 20 MG PO TABS
60.0000 mg | ORAL_TABLET | Freq: Every day | ORAL | Status: DC
  Administered 2022-02-27: 60 mg via ORAL
  Filled 2022-02-26: qty 3

## 2022-02-26 MED ORDER — ONDANSETRON HCL 4 MG/2ML IJ SOLN: 4.0000 mg | Freq: Four times a day (QID) | INTRAMUSCULAR | Status: DC | PRN

## 2022-02-26 MED ORDER — RANOLAZINE ER 500 MG PO TB12
500.0000 mg | ORAL_TABLET | Freq: Every morning | ORAL | Status: DC
  Administered 2022-02-27 – 2022-03-01 (×3): 500 mg via ORAL
  Filled 2022-02-26 (×3): qty 1

## 2022-02-26 MED ORDER — SODIUM CHLORIDE 0.9 % IV SOLN
500.0000 mg | Freq: Once | INTRAVENOUS | Status: AC
  Administered 2022-02-26: 500 mg via INTRAVENOUS
  Filled 2022-02-26: qty 5

## 2022-02-26 MED ORDER — POTASSIUM CHLORIDE CRYS ER 10 MEQ PO TBCR
10.0000 meq | EXTENDED_RELEASE_TABLET | Freq: Every day | ORAL | Status: DC
  Administered 2022-02-27: 10 meq via ORAL
  Filled 2022-02-26: qty 1

## 2022-02-26 MED ORDER — CLONAZEPAM 0.5 MG PO TABS
1.0000 mg | ORAL_TABLET | ORAL | Status: DC | PRN
  Administered 2022-02-27 – 2022-02-28 (×2): 1 mg via ORAL
  Filled 2022-02-26 (×2): qty 2

## 2022-02-26 MED ORDER — PANTOPRAZOLE SODIUM 40 MG PO TBEC
40.0000 mg | DELAYED_RELEASE_TABLET | Freq: Two times a day (BID) | ORAL | Status: DC
  Administered 2022-02-27 – 2022-03-01 (×6): 40 mg via ORAL
  Filled 2022-02-26 (×6): qty 1

## 2022-02-26 MED ORDER — METOPROLOL TARTRATE 5 MG/5ML IV SOLN: 2.5000 mg | Freq: Four times a day (QID) | INTRAVENOUS | Status: DC | PRN

## 2022-02-26 MED ORDER — BUDESONIDE 0.25 MG/2ML IN SUSP
0.2500 mg | Freq: Two times a day (BID) | RESPIRATORY_TRACT | Status: DC
  Administered 2022-02-27 – 2022-03-01 (×6): 0.25 mg via RESPIRATORY_TRACT
  Filled 2022-02-26 (×7): qty 2

## 2022-02-26 MED ORDER — FUROSEMIDE 40 MG PO TABS
60.0000 mg | ORAL_TABLET | Freq: Every day | ORAL | Status: DC
  Administered 2022-02-27: 60 mg via ORAL
  Filled 2022-02-26: qty 3

## 2022-02-26 MED ORDER — SODIUM CHLORIDE 0.9 % IV SOLN
1.0000 g | Freq: Once | INTRAVENOUS | Status: AC
  Administered 2022-02-26: 1 g via INTRAVENOUS
  Filled 2022-02-26: qty 10

## 2022-02-26 MED ORDER — ENOXAPARIN SODIUM 40 MG/0.4ML IJ SOSY
40.0000 mg | PREFILLED_SYRINGE | INTRAMUSCULAR | Status: DC
  Administered 2022-02-27: 40 mg via SUBCUTANEOUS
  Filled 2022-02-26: qty 0.4

## 2022-02-26 MED ORDER — ACETAMINOPHEN 650 MG RE SUPP: 650.0000 mg | Freq: Four times a day (QID) | RECTAL | Status: DC | PRN

## 2022-02-26 NOTE — ED Notes (Signed)
[phone] Danielle [name], Daughter for med rec. Patient does not know what medications she takes.

## 2022-02-26 NOTE — ED Provider Notes (Signed)
[facility] EMERGENCY DEPARTMENT Provider Note   CSN: [id number] Arrival date & time: 02/26/22  1753     History  Chief Complaint  Patient presents with   Chest Pain    Danielle [name] is a 88 y.o. female.  Patient here with chest pain that started 5 hours ago.  She was sitting watching TV and started to develop some chest pain.  History of CAD, hypertension, COPD on 3 L of oxygen, heart failure, depression, CKD.  Patient states nothing makes it worse or better.  Pain has improved since when it first started.  Denies any black or bloody stools.  Denies any weakness.  Has had maybe a cough with sputum production.  She is short of breath at times but not endorsing a lot of shortness of breath currently.  Denies any recent illness.  History of pneumonia.  She has not had any chest discomfort recently.  She gets short of breath when she walks but states that is fairly normal for her.  She states that she had a echocardiogram recently.  The history is provided by the patient.       Home Medications Prior to Admission medications   Medication Sig Start Date End Date Taking? Authorizing Provider  albuterol (PROVENTIL HFA;VENTOLIN HFA) 108 (90 BASE) MCG/ACT inhaler Inhale 2 puffs into the lungs every 6 (six) hours as needed for wheezing or shortness of breath.     [provider]  aspirin 81 MG tablet Take 81 mg by mouth daily.    [provider]  budesonide-formoterol (SYMBICORT) 160-4.5 MCG/ACT inhaler Inhale 2 puffs into the lungs 2 (two) times daily.    [provider]  citalopram (CELEXA) 20 MG tablet Take 20 mg by mouth daily. 10/12/16   [provider]  clonazePAM (KLONOPIN) 1 MG tablet Take 1 mg by mouth as needed for anxiety (sleep). 11/20/14   [provider]  diclofenac Sodium (VOLTAREN) 1 % GEL APPLY 2 GRAMS TOPICALLY TO AFFECTED AREA THREE TIMES DAILY Patient taking differently: Apply 2 g topically 4 (four) times  daily. 08/08/19   Newt Minion, MD  furosemide (LASIX) 20 MG tablet Take 3 tablets (60 mg total) by mouth daily. 01/28/22 04/28/22  Park Liter, MD  glimepiride (AMARYL) 4 MG tablet Take 4 mg by mouth daily.    [provider]  JANUVIA 25 MG tablet Take 100 mg by mouth daily. 12/22/18   [provider]  metFORMIN (GLUCOPHAGE-XR) 500 MG 24 hr tablet Take 500 mg by mouth daily. 07/09/19   [provider]  metoprolol succinate (TOPROL-XL) 25 MG 24 hr tablet Take 25 mg by mouth daily. 11/20/14   [provider]  nitroGLYCERIN (NITROSTAT) 0.4 MG SL tablet Place 1 tablet (0.4 mg total) under the tongue every 5 (five) minutes as needed for chest pain. 08/22/20 01/17/22  Park Liter, MD  NYSTATIN powder Apply topically 3 (three) times daily. 11/15/21   [provider]  pantoprazole (PROTONIX) 40 MG tablet Take 40 mg by mouth 2 (two) times daily. 11/14/16   [provider]  potassium chloride (MICRO-K) 10 MEQ CR capsule Take 10 mEq by mouth daily. 11/20/14   [provider]  ranolazine (RANEXA) 500 MG 12 hr tablet Take 1 tablet (500 mg total) by mouth in the morning. 01/29/22   Park Liter, MD  rOPINIRole (REQUIP) 0.25 MG tablet Take 0.25 mg by mouth at bedtime.    [provider]  rosuvastatin (CRESTOR)  40 MG tablet Take 40 mg by mouth daily.    [provider]  Vitamin D, Ergocalciferol, (DRISDOL) 50000 UNITS CAPS capsule Take 50,000 Units by mouth every Sunday. No specific days    [provider]      Allergies    Codeine, Levofloxacin, Penicillins, and Tape    Review of Systems   Review of Systems  Physical Exam Updated Vital Signs  ED Triage Vitals  Enc Vitals Group     BP 02/26/22 1930 104/82     Pulse Rate 02/26/22 1930 (!) 129     Resp 02/26/22 1930 19     Temp 02/26/22 1830 98.1 F (36.7 C)     Temp Source 02/26/22 1830 Oral     SpO2 02/26/22 1930 100 %     Weight 02/26/22 1828  245 lb 13 oz (111.5 kg)     Height 02/26/22 1828 '5\' 3"'$  (1.6 m)     Head Circumference --      Peak Flow --      Pain Score 02/26/22 1828 8     Pain Loc --      Pain Edu? --      Excl. in [place]? --     Physical Exam Vitals and nursing note reviewed.  Constitutional:      General: She is not in acute distress.    Appearance: She is well-developed. She is not ill-appearing.  HENT:     Head: Normocephalic and atraumatic.  Eyes:     Extraocular Movements: Extraocular movements intact.     Conjunctiva/sclera: Conjunctivae normal.     Pupils: Pupils are equal, round, and reactive to light.  Cardiovascular:     Rate and Rhythm: Normal rate and regular rhythm.     Pulses:          Radial pulses are 2+ on the right side and 2+ on the left side.     Heart sounds: No murmur heard. Pulmonary:     Effort: Pulmonary effort is normal. No respiratory distress.     Breath sounds: Normal breath sounds.  Abdominal:     Palpations: Abdomen is soft.     Tenderness: There is no abdominal tenderness.  Musculoskeletal:        General: No swelling.     Cervical back: Normal range of motion and neck supple.  Skin:    General: Skin is warm and dry.     Capillary Refill: Capillary refill takes less than 2 seconds.  Neurological:     Mental Status: She is alert.  Psychiatric:        Mood and Affect: Mood normal.     ED Results / Procedures / Treatments   Labs (all labs ordered are listed, but only abnormal results are displayed) Labs Reviewed  COMPREHENSIVE METABOLIC PANEL - Abnormal; Notable for the following components:      Result Value   Glucose, Bld 249 (*)    Total Protein 6.2 (*)    All other components within normal limits  URINALYSIS, ROUTINE W REFLEX MICROSCOPIC - Abnormal; Notable for the following components:   Color, Urine STRAW (*)    Leukocytes,Ua SMALL (*)    All other components within normal limits  TROPONIN I (HIGH SENSITIVITY) - Abnormal; Notable for the following  components:   Troponin I (High Sensitivity) 30 (*)    All other components within normal limits  TROPONIN I (HIGH SENSITIVITY) - Abnormal; Notable for the following components:   Troponin I (High Sensitivity)  43 (*)    All other components within normal limits  RESP PANEL BY RT-PCR (FLU A&B, COVID) ARPGX2  CBC WITH DIFFERENTIAL/PLATELET  BRAIN NATRIURETIC PEPTIDE    EKG EKG Interpretation  Date/Time:  Wednesday February 26 2022 19:43:26 EST Ventricular Rate:  92 PR Interval:  82 QRS Duration: 96 QT Interval:  367 QTC Calculation: 454 R Axis:   -44 Text Interpretation: Atrial fibrillation Left axis deviation Abnormal R-wave progression, early transition Confirmed by Lennice Sites ([id number]) on 02/26/2022 7:53:28 PM  Radiology CT Angio Chest PE W and/or Wo Contrast  Result Date: 02/26/2022 CLINICAL DATA:  High probability of pulmonary embolism EXAM: CT ANGIOGRAPHY CHEST WITH CONTRAST TECHNIQUE: Multidetector CT imaging of the chest was performed using the standard protocol during bolus administration of intravenous contrast. Multiplanar CT image reconstructions and MIPs were obtained to evaluate the vascular anatomy. RADIATION DOSE REDUCTION: This exam was performed according to the departmental dose-optimization program which includes automated exposure control, adjustment of the mA and/or kV according to patient size and/or use of iterative reconstruction technique. CONTRAST:  36m OMNIPAQUE IOHEXOL 350 MG/ML SOLN COMPARISON:  Radiographs earlier today CT chest angiogram 09/21/2021 FINDINGS: Cardiovascular: Satisfactory opacification of the pulmonary arteries to the segmental level. No evidence of pulmonary embolism. Mild cardiomegaly. No pericardial effusion. Coronary artery and aortic athero sclerotic calcification. Mediastinum/Nodes: No enlarged mediastinal, hilar, or axillary lymph nodes. Thyroid gland, trachea, and esophagus demonstrate no significant findings. Lungs/Pleura: Mosaic  attenuation compatible with air trapping. Atelectasis or infiltrates in the lower lungs. Mild bronchial wall thickening in the lower lungs. Scattered mucous plugs in the lower lungs. Scattered pulmonary nodules measuring up to 6 mm in the anterior right upper lobe (series 6/62 and series 6/77). These appear triangular in shape on coronal images and likely represents focal atelectasis or intrapulmonary lymph nodes. Upper Abdomen: No acute abnormality. Stable left adrenal nodule compatible with adenoma. No follow-up recommended. Musculoskeletal: No chest wall abnormality. No acute osseous findings. Thoracic spondylosis with multilevel bridging. Review of the MIP images confirms the above findings. IMPRESSION: Negative for acute pulmonary embolism. Chronic bronchitis/reactive airways possibly secondary to aspiration. Coronary artery and Aortic Atherosclerosis (ICD10-I70.0). Electronically Signed   By: TPlacido SouM.D.   On: 02/26/2022 21:20   DG Chest Portable 1 View  Result Date: 02/26/2022 CLINICAL DATA:  Shortness of breath EXAM: PORTABLE CHEST 1 VIEW COMPARISON:  12/23/2021 FINDINGS: Heart size within normal limits. Aortic atherosclerosis. Low lung volumes. Airspace opacity in the periphery of the right lower lobe. Mild left basilar atelectasis. No pleural effusion or pneumothorax. IMPRESSION: Airspace opacity in the periphery of the right lower lobe, suspicious for pneumonia. Electronically Signed   By: NDavina PokeD.O.   On: 02/26/2022 18:19    Procedures Procedures    Medications Ordered in ED Medications  cefTRIAXone (ROCEPHIN) 1 g in sodium chloride 0.9 % 100 mL IVPB (has no administration in time range)  azithromycin (ZITHROMAX) 500 mg in sodium chloride 0.9 % 250 mL IVPB (has no administration in time range)  fentaNYL (SUBLIMAZE) injection 50 mcg (50 mcg Intravenous Given 02/26/22 1928)  metoprolol tartrate (LOPRESSOR) injection 5 mg (5 mg Intravenous Given 02/26/22 1931)   ipratropium-albuterol (DUONEB) 0.5-2.5 (3) MG/3ML nebulizer solution 3 mL (3 mLs Nebulization Given 02/26/22 2020)  methylPREDNISolone sodium succinate (SOLU-MEDROL) 125 mg/2 mL injection 125 mg (125 mg Intravenous Given 02/26/22 2020)  iohexol (OMNIPAQUE) 350 MG/ML injection 75 mL (75 mLs Intravenous Contrast Given 02/26/22 2110)    ED Course/ Medical Decision Making/ A&P  Medical Decision Making Amount and/or Complexity of Data Reviewed Labs: ordered. Radiology: ordered.  Risk Prescription drug management. Decision regarding hospitalization.   [name] is here with chest pain, cough, shortness of breath.  Patient arrives tachycardic fairly consistent in the 130s.  Concern for atrial tachycardia/atrial fibrillation/atrial flutter based on the EKG.  Patient was given a dose of Lopressor and heart rate improved into the 90s and appears to be A-fib which would be new for the patient.  She has a history of COPD on several liters of oxygen at home, CAD.  She has been having chest pain the last 5 hours also cough and sputum production.  Shortness of breath as well.  She is got diminished air movement on exam.  Increased work of breathing.  Differential diagnosis is pneumonia versus ACS versus COPD exacerbation versus new A-fib causing the symptoms.  Will check labs including CBC, troponin, chest x-ray, BNP, CBC.  She does have a fever.  Blood pressure is unremarkable.  Per radiology report on chest x-ray possible pneumonia.  Will get a CT scan to further evaluate.  Per my review and interpretation labs otherwise has no significant leukocytosis or anemia or electrolyte abnormality.  Troponin is 30 possibly from demand given tachycardia.  BNP is within normal limits.  She seems to have responded to IV dose of Lopressor at this time.  I am given DuoNeb treatment and steroids for suspected COPD exacerbation in the setting of possibly pneumonia.  However this could be  atrial fibrillation driven as well.  Overall appears that she is in a normal sinus rhythm now.  After breathing treatment she is got better air movement.  She is on slightly increased oxygen at 4 L from her baseline.  CT scan was negative for pulmonary embolism but did show some chronic bronchitis/reactive airways may be aspiration.  Overall my suspicion is may be for viral process/may be pneumonia causing COPD exacerbation.  Troponin has been 30 and 43.  This seems less likely to be ACS.  Chest pain is improved.  She did have this atrial tachycardia that maybe was atrial fibrillation but that seems to have resolved.  Overall I think she benefit from observation stay for further breathing treatments, some IV antibiotics and cardiac monitoring.  Will admit to medicine.  This chart was dictated using voice recognition software.  Despite best efforts to proofread,  errors can occur which can change the documentation meaning.         Final Clinical Impression(s) / ED Diagnoses Final diagnoses:  Acute respiratory failure, unspecified whether with hypoxia or hypercapnia ([place])  Community acquired pneumonia, unspecified laterality  Atrial tachycardia  COPD exacerbation [facility])    Rx / DC Orders ED Discharge Orders     None         Lennice Sites, DO 02/26/22 2132

## 2022-02-26 NOTE — H&P (Signed)
[facility] History and Physical  [name] IWL:[id number] DOB: 01/10/1934 DOA: 02/26/2022   PCP: [name], MD  Specialists: Dr. Agustin Cree is her cardiologist  Chief Complaint: Chest pain  HPI: AVRIEL [name] is Danielle 88 y.o. female with Danielle past medical history of COPD, chronic respiratory failure with hypoxia on home oxygen, coronary artery disease, morbid obesity, diabetes mellitus type 2, chronic kidney disease stage IIIa, hyperlipidemia who was in her usual state of health till earlier this [name] when she started developing chest pain.  It was located in the central part of her chest and then it started radiating towards her jaw.  Pain was 10 out of 10 in intensity.  Pressure-like sensation.  Did not have any nausea or vomiting.  No fever or chills recently.  She does have Danielle chronic cough from her COPD.  Denies any sick contacts recently.  Has been taking her medications regularly and as prescribed.  She mentions that she recently was treated for Danielle urinary tract infection and completed course of levofloxacin on Monday.  She called EMS.  She was noted to be tachycardic by EMS.  She was brought into the emergency department.  She was given metoprolol for tachycardia.  She was given breathing treatments.  Unable to tell me if she took any nitroglycerin at home although she does have sublingual nitroglycerin.  After these interventions her heart rate improved.  Her chest symptoms also subsided.  Troponins noted to be mildly elevated.  CT angiogram negative for PE.  Initial EKG suggested atrial tachycardia.  Subsequent EKG suggested atrial fibrillation.  Home Medications: Prior to Admission medications   Medication Sig Start Date End Date Taking? Authorizing Provider  albuterol (PROVENTIL HFA;VENTOLIN HFA) 108 (90 BASE) MCG/ACT inhaler Inhale 2 puffs into the lungs every 6 (six) hours as needed for wheezing or shortness of breath.     [provider]  aspirin  81 MG tablet Take 81 mg by mouth daily.    [provider]  budesonide-formoterol (SYMBICORT) 160-4.5 MCG/ACT inhaler Inhale 2 puffs into the lungs 2 (two) times daily.    [provider]  citalopram (CELEXA) 20 MG tablet Take 20 mg by mouth daily. 10/12/16   [provider]  clonazePAM (KLONOPIN) 1 MG tablet Take 1 mg by mouth as needed for anxiety (sleep). 11/20/14   [provider]  diclofenac Sodium (VOLTAREN) 1 % GEL APPLY 2 GRAMS TOPICALLY TO AFFECTED AREA THREE TIMES DAILY Patient taking differently: Apply 2 g topically 4 (four) times daily. 08/08/19   Newt Minion, MD  furosemide (LASIX) 20 MG tablet Take 3 tablets (60 mg total) by mouth daily. 01/28/22 04/28/22  Park Liter, MD  glimepiride (AMARYL) 4 MG tablet Take 4 mg by mouth daily.    [provider]  JANUVIA 25 MG tablet Take 100 mg by mouth daily. 12/22/18   [provider]  metFORMIN (GLUCOPHAGE-XR) 500 MG 24 hr tablet Take 500 mg by mouth daily. 07/09/19   [provider]  metoprolol succinate (TOPROL-XL) 25 MG 24 hr tablet Take 25 mg by mouth daily. 11/20/14   [provider]  nitroGLYCERIN (NITROSTAT) 0.4 MG SL tablet Place 1 tablet (0.4 mg total) under the tongue every 5 (five) minutes as needed for chest pain. 08/22/20 01/17/22  Park Liter, MD  NYSTATIN powder Apply topically 3 (three) times daily. 11/15/21   [provider]  pantoprazole (PROTONIX) 40 MG tablet Take 40 mg by mouth 2 (two) times  daily. 11/14/16   [provider]  potassium chloride (MICRO-K) 10 MEQ CR capsule Take 10 mEq by mouth daily. 11/20/14   [provider]  ranolazine (RANEXA) 500 MG 12 hr tablet Take 1 tablet (500 mg total) by mouth in the morning. 01/29/22   Park Liter, MD  rOPINIRole (REQUIP) 0.25 MG tablet Take 0.25 mg by mouth at bedtime.    [provider]  rosuvastatin (CRESTOR) 40 MG tablet Take 40 mg by mouth daily.     [provider]  Vitamin D, Ergocalciferol, (DRISDOL) 50000 UNITS CAPS capsule Take 50,000 Units by mouth every Sunday. No specific days    [provider]    Allergies:  Allergies  Allergen Reactions   Codeine Swelling   Levofloxacin     Other reaction(s): Malaise (intolerance)   Penicillins Swelling    Has patient had Danielle PCN reaction causing immediate rash, facial/tongue/throat swelling, SOB or lightheadedness with hypotension:YES Has patient had Danielle PCN reaction causing severe rash involving mucus membranes or skin necrosis: NO Has patient had Danielle PCN reaction that required hospitalization NO Has patient had Danielle PCN reaction occurring within the last 10 years: NO If all of the above answers are "NO", then may proceed with Cephalosporin use.   Tape Rash    Past Medical History: Past Medical History:  Diagnosis Date   Anginal pain ([place])    Arthritis    Asthma    At high risk for falls 11/11/2017   Chest pain 11/09/2015   CHF (congestive heart failure) (HCC)    Chronic diastolic congestive heart failure ([place]) 11/11/2015   Chronic insomnia 10/24/2015   Chronic pain of both knees 05/14/2018   Chronic respiratory failure with hypoxia ([place]) 05/05/2017   CKD (chronic kidney disease) stage 3, GFR 30-59 ml/min (HCC) 05/07/2017   COPD (chronic obstructive pulmonary disease) ([place])    Coronary artery disease    Coronary artery disease involving native coronary artery of native heart without angina pectoris 11/14/2014   Overview:  40% RCA Danielle cardiac catheter in 2014   Current mild episode of major depressive disorder ([place]) 11/14/2014   Depression    DOE (dyspnea on exertion) 09/07/2018   Dysphagia 08/12/2018   Edema 08/12/2018   Elevated blood sugar 11/09/2015   Emphysema lung (HCC)    Flat feet, bilateral 05/30/2020   Gastroesophageal reflux disease without esophagitis 11/11/2017   GERD (gastroesophageal reflux disease)    Headache    History of hiatal hernia    Hyperlipidemia     Hypertension    Insomnia    Internal hemorrhoids 11/26/2018   Iron deficiency anemia 08/12/2018   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/26/2018   Mesenteric mass 02/19/2015   Midsternal chest pain 11/11/2015   Mixed hyperlipidemia 11/14/2014   Morbid obesity ([place]) 09/07/2018   OSA (obstructive sleep apnea) 12/16/2019   Osteoporosis    Panlobular emphysema ([place]) 11/09/2015   Pneumonia 2013   PONV (postoperative nausea and vomiting)    Positive D-dimer 09/08/2018   Restless legs syndrome 03/04/2018   Senile purpura ([place]) 08/12/2018   Shortness of breath dyspnea    with exertion   Stasis edema of left lower extremity 04/13/2018   Strain of right shoulder 08/12/2018   Type 2 diabetes mellitus with stage 3 chronic kidney disease ([place]) 08/14/2015   Unstable angina ([place]) 10/30/2018   Vitamin B12 deficiency 04/13/2018    Past Surgical History:  Procedure Laterality Date   ABDOMINAL HYSTERECTOMY     APPENDECTOMY  BREAST SURGERY  40 yrs. ago   growth removed in each breast-benign   CARDIAC CATHETERIZATION     12/14/12 LHC ([facility]): few mild stenotic areas max 40% of ectatic RCA o/w NL coronaries, EF 65%. Med tx.   CATARACT EXTRACTION W/ INTRAOCULAR LENS  IMPLANT, BILATERAL Bilateral 15 yrs ago   CHOLECYSTECTOMY     COLONOSCOPY  04/22/2013   Diverticulosis in the sigmoid colon. Non bleeding internal hemorrhoids. Normal mucosa vascular pattern in the entire examined colon. Three 6 mm polyps n the descending colon. Resected and retrieved.    FOOT SURGERY     LAPAROSCOPIC APPENDECTOMY N/Danielle 02/19/2015   Procedure: EXCISION OF MESENTERIC MASS;  Surgeon: Stark Klein, MD;  Location: [facility];  Service: General;  Laterality: N/Danielle;   REPLACEMENT TOTAL KNEE BILATERAL      Social History: Lives by herself.  Uses Danielle walker to ambulate.  No current smoking.  No alcohol use.  Family History:  Family History  Problem Relation Age of Onset   Heart attack Mother      Review of Systems - History obtained from the patient and her  daughter General ROS: positive for  - fatigue Psychological ROS: positive for - anxiety Ophthalmic ROS: negative ENT ROS: negative Allergy and Immunology ROS: negative Hematological and Lymphatic ROS: negative Endocrine ROS: negative Respiratory ROS: As in HPI Cardiovascular ROS: As in HPI Gastrointestinal ROS: no abdominal pain, change in bowel habits, or black or bloody stools Genito-Urinary ROS: no dysuria, trouble voiding, or hematuria Musculoskeletal ROS: negative Neurological ROS: no TIA or stroke symptoms Dermatological ROS: negative  Physical Examination  Vitals:   02/26/22 2045 02/26/22 2139 02/26/22 2215 02/26/22 2230  BP: 109/74 (!) 114/51 (!) 126/58 (!) 126/54  Pulse: 94 69 73 71  Resp: (!) 22 20 (!) 21 19  Temp:      TempSrc:      SpO2: 99% 98% 99% 99%  Weight:      Height:        BP (!) 126/54   Pulse 71   Temp 98.1 F (36.7 C) (Oral)   Resp 19   Ht '5\' 3"'$  (1.6 m)   Wt 111.5 kg   SpO2 99%   BMI 43.54 kg/m   General appearance: alert, cooperative, appears stated age, and no distress.  Anxious Head: Normocephalic, without obvious abnormality, atraumatic Eyes: conjunctivae/corneas clear. PERRL, EOM's intact.  Throat: lips, mucosa, and tongue normal; teeth and gums normal Neck: no adenopathy, no carotid bruit, no JVD, supple, symmetrical, trachea midline, and thyroid not enlarged, symmetric, no tenderness/mass/nodules Resp: Normal effort at rest.  Coarse breath sounds bilaterally.  No wheezing or rhonchi appreciated at this time Cardio: regular rate and rhythm, S1, S2 normal, no murmur, click, rub or gallop GI: soft, non-tender; bowel sounds normal; no masses,  no organomegaly Extremities: 1+ edema plus edema bilateral lower extremities Pulses: 2+ and symmetric Skin: Skin color, texture, turgor normal. No rashes or lesions Lymph nodes: Cervical, supraclavicular, and axillary nodes normal. Neurologic: Cranial nerves II to XII intact.  Motor strength equal  bilateral upper and lower extremities.   Labs on Admission: I have personally reviewed following labs and imaging studies  CBC: Recent Labs  Lab 02/26/22 1810  WBC 10.5  NEUTROABS 7.5  HGB 12.6  HCT 39.3  MCV 87.7  PLT 244   Basic Metabolic Panel: Recent Labs  Lab 02/26/22 1810  NA 137  K 3.6  CL 98  CO2 24  GLUCOSE 249*  BUN 20  CREATININE 0.82  CALCIUM 8.9   GFR: Estimated Creatinine Clearance: 59.1 mL/min (by C-G formula based on SCr of 0.82 mg/dL). Liver Function Tests: Recent Labs  Lab 02/26/22 1810  AST 27  ALT 19  ALKPHOS 56  BILITOT 0.3  PROT 6.2*  ALBUMIN 3.8    BNP (last 3 results) Recent Labs    08/06/21 1351 01/17/22 1626  PROBNP 198 154    Radiological Exams on Admission: CT Angio Chest PE W and/or Wo Contrast  Result Date: 02/26/2022 CLINICAL DATA:  High probability of pulmonary embolism EXAM: CT ANGIOGRAPHY CHEST WITH CONTRAST TECHNIQUE: Multidetector CT imaging of the chest was performed using the standard protocol during bolus administration of intravenous contrast. Multiplanar CT image reconstructions and MIPs were obtained to evaluate the vascular anatomy. RADIATION DOSE REDUCTION: This exam was performed according to the departmental dose-optimization program which includes automated exposure control, adjustment of the mA and/or kV according to patient size and/or use of iterative reconstruction technique. CONTRAST:  42m OMNIPAQUE IOHEXOL 350 MG/ML SOLN COMPARISON:  Radiographs earlier today CT chest angiogram 09/21/2021 FINDINGS: Cardiovascular: Satisfactory opacification of the pulmonary arteries to the segmental level. No evidence of pulmonary embolism. Mild cardiomegaly. No pericardial effusion. Coronary artery and aortic athero sclerotic calcification. Mediastinum/Nodes: No enlarged mediastinal, hilar, or axillary lymph nodes. Thyroid gland, trachea, and esophagus demonstrate no significant findings. Lungs/Pleura: Mosaic attenuation  compatible with air trapping. Atelectasis or infiltrates in the lower lungs. Mild bronchial wall thickening in the lower lungs. Scattered mucous plugs in the lower lungs. Scattered pulmonary nodules measuring up to 6 mm in the anterior right upper lobe (series 6/62 and series 6/77). These appear triangular in shape on coronal images and likely represents focal atelectasis or intrapulmonary lymph nodes. Upper Abdomen: No acute abnormality. Stable left adrenal nodule compatible with adenoma. No follow-up recommended. Musculoskeletal: No chest wall abnormality. No acute osseous findings. Thoracic spondylosis with multilevel bridging. Review of the MIP images confirms the above findings. IMPRESSION: Negative for acute pulmonary embolism. Chronic bronchitis/reactive airways possibly secondary to aspiration. Coronary artery and Aortic Atherosclerosis (ICD10-I70.0). Electronically Signed   By: TPlacido SouM.D.   On: 02/26/2022 21:20   DG Chest Portable 1 View  Result Date: 02/26/2022 CLINICAL DATA:  Shortness of breath EXAM: PORTABLE CHEST 1 VIEW COMPARISON:  12/23/2021 FINDINGS: Heart size within normal limits. Aortic atherosclerosis. Low lung volumes. Airspace opacity in the periphery of the right lower lobe. Mild left basilar atelectasis. No pleural effusion or pneumothorax. IMPRESSION: Airspace opacity in the periphery of the right lower lobe, suspicious for pneumonia. Electronically Signed   By: NDavina PokeD.O.   On: 02/26/2022 18:19    My interpretation of Electrocardiogram: Initial EKG shows narrow complex tachycardia which appears to be regular at Danielle rate of 130.  No concerning ischemic changes noted. Subsequent EKG was interpreted by the computer as atrial fibrillation.  Rhythm is hard to distinguish, rate is in the 90s.   Problem List  Principal Problem:   Chest pain Active Problems:   Coronary artery disease involving native coronary artery of native heart without angina pectoris    Chronic respiratory failure with hypoxia (HCC)   Stage 3a chronic kidney disease (HCC)   Type 2 diabetes mellitus with stage 3 chronic kidney disease (HCC)   COPD (chronic obstructive pulmonary disease) (HCC)   Multifocal atrial tachycardia   Assessment: This is of 88year old Caucasian female with Danielle past medical history as stated earlier who comes in due to chest pain.  Fairly sudden  onset this [name].  Differential diagnosis is broad.  She was noted to be tachycardic when EMS came to evaluate her.  Her symptoms could have been due to the tachyarrhythmia.  She does have features concerning for cardiac etiology although acute coronary syndrome seems less likely.  She does admit to acid reflux which could be another reason for her symptoms.  CT angiogram ruled out Danielle PE.  Does not appear to have any infection in her lungs at this time.  Acute bronchitis could have contributed again less likely.  Plan:  #1. Chest pain: As mentioned above differential diagnosis is broad.  Troponins only minimally elevated.  Will recheck another level tomorrow morning.  She has Danielle history of nonobstructive coronary artery disease.  She was recently seen by her cardiologist and mentioned symptoms of chest pain to him.  She was started on Ranexa.  She took this medicine for Danielle while and then stopped taking it.  She be placed back on Ranexa.  We will recheck her troponin level in the morning.  She had Danielle recent echocardiogram on November 7 which showed normal left ventricular systolic function without any wall motion abnormalities.  No need to repeat another echocardiogram.  However some of the features of her chest pain were concerning.  In view of this we will go ahead and consult cardiology to evaluate her.  Message has been sent to [facility].  Continue with aspirin beta-blocker.  Continue with statin.  #2.  Tachyarrhythmia: Could have been atrial tachycardia.  When she was given metoprolol heart rate started slowing down and  there was one EKG which suggested atrial fibrillation though it is hard to say.  We will have cardiology weigh in.  Currently she is in sinus rhythm.  #3.  History of COPD with chronic hypoxic respiratory failure: She is on home oxygen.  ED provider mentioned that patient was not moving much air during initial assessment.  She was given Danielle nebulizer treatment with improvement.  She is not wheezing currently.  She was given Solu-Medrol.  Will give her prednisone from tomorrow.  Continue with nebulizer treatments.  Chest x-ray suggested right-sided pneumonia but CT angiogram was equivocal.  She does have normal WBC.  She is afebrile.  Does not have new cough.  Will give her Danielle course of doxycycline starting tomorrow.  She was given ceftriaxone and azithromycin tonight.  COVID-19 influenza PCR negative.  #4.  Concern for dysphagia: Family mentions that patient does tend to choke up when she eats her meals.  Will request speech therapy to see.  #5.  Chronic kidney disease stage IIIa: Renal function at baseline.  #6.  Morbid obesity: Estimated body mass index is 43.54 kg/m as calculated from the following:   Height as of this encounter: '5\' 3"'$  (1.6 m).   Weight as of this encounter: 111.5 kg.  #7. Diabetes mellitus type 2: SSI will be ordered.  Holding her oral agents.  #8. History of GERD: Continue with Protonix.  Could consider adding nightly Pepcid if no other reason for her chest pain is found.   DVT Prophylaxis: Lovenox Code Status: Partial code.  See orders Family Communication: Discussed with her daughter Disposition: Hopefully return home in improved Consults called: Message sent to [facility] cardiology Admission Status: Status is: Observation The patient remains OBS appropriate and will d/c before 2 midnights.    Severity of Illness: The appropriate patient status for this patient is OBSERVATION. Observation status is judged to be reasonable and necessary in order  to provide the required  intensity of service to ensure the patient's safety. The patient's presenting symptoms, physical exam findings, and initial radiographic and laboratory data in the context of their medical condition is felt to place them at decreased risk for further clinical deterioration. Furthermore, it is anticipated that the patient will be medically stable for discharge from the hospital within 2 midnights of admission.    Further management decisions will depend on results of further testing and patient's response to treatment.   Averee Harb [employer]  [facility] operational officer on [email or web address]  02/26/2022, 10:51 PM

## 2022-02-26 NOTE — ED Notes (Signed)
Patient on bedside commode

## 2022-02-26 NOTE — ED Triage Notes (Signed)
THE PT ARRIVED BY [facility] FROM HOME THE PT IS C/O CHEST PAIN AND SOB   HX CHF  SHE REFUSED ASPIRIN BY EMS  IV PLACED BY THEM   SINUS TACH ON THE MONITOR  RECENT  CLEARED BY HER HEART DOCTOR.   A AND O X 4   EMS GAVE THE PT 75 ML OF NSS ON THE WAY HERE  SHE HAS A MOST FORM

## 2022-02-27 DIAGNOSIS — I2510 Atherosclerotic heart disease of native coronary artery without angina pectoris: Secondary | ICD-10-CM

## 2022-02-27 DIAGNOSIS — N1831 Chronic kidney disease, stage 3a: Secondary | ICD-10-CM | POA: Diagnosis not present

## 2022-02-27 DIAGNOSIS — I5033 Acute on chronic diastolic (congestive) heart failure: Secondary | ICD-10-CM | POA: Diagnosis present

## 2022-02-27 DIAGNOSIS — I480 Paroxysmal atrial fibrillation: Secondary | ICD-10-CM | POA: Diagnosis not present

## 2022-02-27 DIAGNOSIS — I484 Atypical atrial flutter: Secondary | ICD-10-CM

## 2022-02-27 DIAGNOSIS — J410 Simple chronic bronchitis: Secondary | ICD-10-CM

## 2022-02-27 DIAGNOSIS — I200 Unstable angina: Secondary | ICD-10-CM | POA: Diagnosis not present

## 2022-02-27 DIAGNOSIS — I10 Essential (primary) hypertension: Secondary | ICD-10-CM

## 2022-02-27 LAB — COMPREHENSIVE METABOLIC PANEL
ALT: 20 U/L (ref 0–44)
AST: 25 U/L (ref 15–41)
Albumin: 3.4 g/dL — ABNORMAL LOW (ref 3.5–5.0)
Alkaline Phosphatase: 55 U/L (ref 38–126)
Anion gap: 11 (ref 5–15)
BUN: 20 mg/dL (ref 8–23)
CO2: 24 mmol/L (ref 22–32)
Calcium: 8.7 mg/dL — ABNORMAL LOW (ref 8.9–10.3)
Chloride: 99 mmol/L (ref 98–111)
Creatinine, Ser: 0.88 mg/dL (ref 0.44–1.00)
GFR, Estimated: >60 mL/min (ref >60)
Glucose, Bld: 391 mg/dL — ABNORMAL HIGH (ref 70–99)
Potassium: 4.4 mmol/L (ref 3.5–5.1)
Sodium: 134 mmol/L — ABNORMAL LOW (ref 135–145)
Total Bilirubin: 0.1 mg/dL — ABNORMAL LOW (ref 0.3–1.2)
Total Protein: 5.7 g/dL — ABNORMAL LOW (ref 6.5–8.1)

## 2022-02-27 LAB — CBG MONITORING, ED
Glucose-Capillary: 336 mg/dL — ABNORMAL HIGH (ref 70–99)
Glucose-Capillary: 339 mg/dL — ABNORMAL HIGH (ref 70–99)
Glucose-Capillary: 375 mg/dL — ABNORMAL HIGH (ref 70–99)

## 2022-02-27 LAB — LIPID PANEL
Cholesterol: 155 mg/dL (ref 0–200)
HDL: 39 mg/dL — ABNORMAL LOW (ref >40)
LDL Cholesterol: 88 mg/dL (ref 0–99)
Total CHOL/HDL Ratio: 4 RATIO
Triglycerides: 142 mg/dL (ref <150)
VLDL: 28 mg/dL (ref 0–40)

## 2022-02-27 LAB — TROPONIN I (HIGH SENSITIVITY): Troponin I (High Sensitivity): 27 ng/L — ABNORMAL HIGH (ref <18)

## 2022-02-27 LAB — TSH: TSH: 0.262 u[IU]/mL — ABNORMAL LOW (ref 0.350–4.500)

## 2022-02-27 LAB — GLUCOSE, CAPILLARY
Glucose-Capillary: 324 mg/dL — ABNORMAL HIGH (ref 70–99)
Glucose-Capillary: 369 mg/dL — ABNORMAL HIGH (ref 70–99)
Glucose-Capillary: 358 mg/dL — ABNORMAL HIGH (ref 70–99)

## 2022-02-27 LAB — HEMOGLOBIN A1C
Hgb A1c MFr Bld: 10.2 % — ABNORMAL HIGH (ref 4.8–5.6)
Mean Plasma Glucose: 246 mg/dL

## 2022-02-27 LAB — HEPARIN LEVEL (UNFRACTIONATED): Heparin Unfractionated: 0.67 IU/mL (ref 0.30–0.70)

## 2022-02-27 MED ORDER — METOPROLOL SUCCINATE ER 50 MG PO TB24
50.0000 mg | ORAL_TABLET | Freq: Every day | ORAL | Status: DC
  Administered 2022-02-28 – 2022-03-01 (×2): 50 mg via ORAL
  Filled 2022-02-27 (×2): qty 1

## 2022-02-27 MED ORDER — FUROSEMIDE 10 MG/ML IJ SOLN
40.0000 mg | Freq: Two times a day (BID) | INTRAMUSCULAR | Status: DC
  Administered 2022-02-27 – 2022-03-01 (×4): 40 mg via INTRAVENOUS
  Filled 2022-02-27 (×4): qty 4

## 2022-02-27 MED ORDER — AMIODARONE HCL 200 MG PO TABS
200.0000 mg | ORAL_TABLET | Freq: Two times a day (BID) | ORAL | Status: DC
  Administered 2022-02-27 – 2022-03-01 (×5): 200 mg via ORAL
  Filled 2022-02-27 (×5): qty 1

## 2022-02-27 MED ORDER — LEVALBUTEROL HCL 0.63 MG/3ML IN NEBU
0.6300 mg | INHALATION_SOLUTION | Freq: Three times a day (TID) | RESPIRATORY_TRACT | Status: DC
  Administered 2022-02-28: 0.63 mg via RESPIRATORY_TRACT
  Filled 2022-02-27: qty 3

## 2022-02-27 MED ORDER — METOPROLOL TARTRATE 25 MG PO TABS
50.0000 mg | ORAL_TABLET | Freq: Once | ORAL | Status: AC
  Administered 2022-02-27: 50 mg via ORAL
  Filled 2022-02-27: qty 2

## 2022-02-27 MED ORDER — HEPARIN BOLUS VIA INFUSION
3000.0000 [IU] | Freq: Once | INTRAVENOUS | Status: AC
  Administered 2022-02-27: 3000 [IU] via INTRAVENOUS
  Filled 2022-02-27: qty 3000

## 2022-02-27 MED ORDER — HEPARIN (PORCINE) 25000 UT/250ML-% IV SOLN
1200.0000 [IU]/h | INTRAVENOUS | Status: DC
  Administered 2022-02-27 – 2022-02-28 (×2): 1200 [IU]/h via INTRAVENOUS
  Filled 2022-02-27 (×2): qty 250

## 2022-02-27 MED ORDER — INSULIN ASPART 100 UNIT/ML IJ SOLN: 0.0000 [IU] | Freq: Three times a day (TID) | INTRAMUSCULAR | Status: DC

## 2022-02-27 MED ORDER — GUAIFENESIN-DM 100-10 MG/5ML PO SYRP
5.0000 mL | ORAL_SOLUTION | ORAL | Status: DC | PRN
  Administered 2022-02-27: 5 mL via ORAL
  Filled 2022-02-27: qty 5

## 2022-02-27 MED ORDER — INSULIN ASPART 100 UNIT/ML IJ SOLN
0.0000 [IU] | Freq: Every day | INTRAMUSCULAR | Status: DC
  Administered 2022-02-27: 5 [IU] via SUBCUTANEOUS
  Administered 2022-02-28: 2 [IU] via SUBCUTANEOUS

## 2022-02-27 MED ORDER — IPRATROPIUM BROMIDE 0.02 % IN SOLN
0.5000 mg | Freq: Three times a day (TID) | RESPIRATORY_TRACT | Status: DC
  Administered 2022-02-28: 0.5 mg via RESPIRATORY_TRACT
  Filled 2022-02-27: qty 2.5

## 2022-02-27 NOTE — Hospital Course (Addendum)
Danielle [name] was admitted to the hospital with the working diagnosis of chest pain in the setting of tachyarrhythmia.   88 yo female with the past medical history of COPD, chronic hypoxemic respiratory failure, coronary artery disease, T2Dm, CKD, obesity and dyslipidemia who presented with chest pain. Precordial chest pain, 10/10 radiated to the jaw, pressure in nature. She called EMS, she was found tachycardic, received metoprolol and was transported to the ED. On her initial physical examination her blood pressure was 109/74, HR 94, RR 22 and 02 saturation 99%, lungs with no wheezing or rales, heart with S1 and S2 present and rhythmic, abdomen with no distention, no lower extremity edema.   Na 137, K 3,6 Cl 98 bicarbonate 24, glucose 249, bun 20 cr 0,82  High sensitive troponin 30 and 43  Wbc 10,5 hgb 12,6 plt 241 Sars covid 19 negative  Urine analysis SG 1,008, negative protein, 21-50 wbc   Chest radiograph with no infiltrates, or effusions  CT chest with faint bilateral ground glass opacities, negative for pulmonary embolism.   EKG 131 bpm, left axis deviation, normal qtc, SVT rhythm with no significant ST segment or T wave changes.  EKG 92 bpm, left axis, normal qtc, atrial fibrillation rhythm with no significant ST segment or T wave changes.   Patient placed on heparin drip and amiodarone for rate control.   11/30 patient converted to sinus rhythm.  12/01 cardiac CT for ischemic work up.

## 2022-02-27 NOTE — Progress Notes (Signed)
Progress Note   Patient: Danielle [name] GUY:[id number] DOB: 04/24/1933 DOA: 02/26/2022     0 DOS: the patient was seen and examined on 02/27/2022   Brief hospital course: Mrs. Moskal was admitted to the hospital with the working diagnosis of chest pain in the setting of tachyarrhythmia.   88 yo female with the past medical history of COPD, chronic hypoxemic respiratory failure, coronary artery disease, T2Dm, CKD, obesity and dyslipidemia who presented with chest pain. Precordial chest pain, 10/10 radiated to the jaw, pressure in nature. She called EMS, she was found tachycardic, received metoprolol and was transported to the ED. On her initial physical examination her blood pressure was 109/74, HR 94, RR 22 and 02 saturation 99%, lungs with no wheezing or rales, heart with S1 and S2 present and rhythmic, abdomen with no distention, no lower extremity edema.   Na 137, K 3,6 Cl 98 bicarbonate 24, glucose 249, bun 20 cr 0,82  High sensitive troponin 30 and 43  Wbc 10,5 hgb 12,6 plt 241 Sars covid 19 negative  Urine analysis SG 1,008, negative protein, 21-50 wbc   Chest radiograph with no infiltrates, or effusions  CT chest with faint bilateral ground glass opacities, negative for pulmonary embolism.   EKG 131 bpm, left axis deviation, normal qtc, SVT rhythm with no significant ST segment or T wave changes.  EKG 92 bpm, left axis, normal qtc, atrial fibrillation rhythm with no significant ST segment or T wave changes.   Patient placed on heparin drip and amiodarone for rate control.   11/30 patient converted to sinus rhythm.   Assessment and Plan: Paroxysmal atrial fibrillation (HCC) Atrial flutter.  Patient has converted to sinus rhythm. No further chest pain, but continue to be very weak and deconditioned.  Echocardiogram with preserved LV systolic function and no significant valvular disease,   Plan to continue rate control with amiodarone and anticoagulation with heparin.    Elevated troponin, possible demand ischemia, plan for coronary CT.  Continue with aspirin.   Acute on chronic diastolic CHF (congestive heart failure) (HCC) Echocardiogram with preserved LV systolic function with EF 60 to 56%, RV systolic function preserved, RVSP 31.3, no significant valvular disease.  Positive volume overload.  Systolic blood pressure 387 to 140 mmHg   Will change furosemide to IV and follow up response.  Continue with metoprolol.    Stage 3a chronic kidney disease (HCC) Hyponatremia,  Renal function with serum cr at 0,8 with K at 4,4 and serum bicarbonate at 24 Na 134.   Plan to continue diuresis with furosemide and follow renal function in am.  Avoid hypotension and nephrotoxic medications.   Coronary artery disease involving native coronary artery of native heart without angina pectoris No further chest pain Plan to continue medical therapy and follow up on CT cardiac.   COPD (chronic obstructive pulmonary disease) (HCC) Chronic hypoxemic respiratory failure. No clinica signs of exacerbation. Continue supplemental 02 per [place].   Positive sings of aspiration, plan for further swallow evaluation.   Type 2 diabetes mellitus with stage 3 chronic kidney disease (HCC) Continue glucose cover and monitoring with insulin sliding scale.  Fasting glucose is 391 consistent with uncontrolled diabetes mellitus with hyperglycemia.   Iron deficiency anemia Cell count has been stable   Hypertension Continue blood pressure control with metoprolol.   Class 3 obesity (HCC) Calculated BMI is 43,5         Subjective: Patient with no chest pain, continue to be very weak and deconditioned, no dysuria  Physical Exam: Vitals:   02/27/22 1400 02/27/22 1415 02/27/22 1421 02/27/22 1457  BP:   (!) 118/50 136/61  Pulse: (!) 57 (!) 58 60 68  Resp: 15 (!) '21 20 20  '$ Temp:   98 F (36.7 C) 98 F (36.7 C)  TempSrc:   Oral Oral  SpO2: 98% 100% 100% 95%  Weight:       Height:       Neurology awake and alert ENT with mild pallor Cardiovascular with S1 and S2 present and rhythmic with no gallops, rubs or murmurs Respiratory with mild rales with no wheezing, no rhonchi Abdomen with no distention  ++ lower extremity edema  Data Reviewed:    Family Communication: I spoke with patient's daughter and son in law at the bedside, we talked in detail about patient's condition, plan of care and prognosis and all questions were addressed.   Disposition: Status is: Observation The patient remains OBS appropriate and will d/c before 2 midnights.  Planned Discharge Destination: Home    Author: Tawni Millers, MD 02/27/2022 4:02 PM  For on call review [email or web address].

## 2022-02-27 NOTE — Assessment & Plan Note (Addendum)
No further chest pain Cardiac CT with no obstructive coronary artery disease.  Patient ruled out for acute coronary syndrome.

## 2022-02-27 NOTE — Assessment & Plan Note (Signed)
Cell count has been stable.  

## 2022-02-27 NOTE — Assessment & Plan Note (Signed)
Calculated BMI is 43,5

## 2022-02-27 NOTE — Assessment & Plan Note (Addendum)
Echocardiogram with preserved LV systolic function with EF 60 to 02%, RV systolic function preserved, RVSP 31.3, no significant valvular disease.  Urine output is documented at 750 ml.  Systolic blood pressure is 116 to 140 mmHg.   Continue diuresis with furosemide IV Continue metoprolol.  Add spironolactone 12,5 mg daily if blood pressure tolerates may add ARB.

## 2022-02-27 NOTE — Progress Notes (Signed)
ANTICOAGULATION CONSULT NOTE - Initial Consult  Pharmacy Consult for heparin Indication: atrial fibrillation  Allergies  Allergen Reactions   Codeine Swelling   Levofloxacin     Other reaction(s): Malaise (intolerance)   Penicillins Swelling    Has patient had a PCN reaction causing immediate rash, facial/tongue/throat swelling, SOB or lightheadedness with hypotension:YES Has patient had a PCN reaction causing severe rash involving mucus membranes or skin necrosis: NO Has patient had a PCN reaction that required hospitalization NO Has patient had a PCN reaction occurring within the last 10 years: NO If all of the above answers are "NO", then may proceed with Cephalosporin use.   Tape Rash    Patient Measurements: Height: '5\' 3"'$  (160 cm) Weight: 111.5 kg (245 lb 13 oz) IBW/kg (Calculated) : 52.4 Heparin Dosing Weight: 79kg  Vital Signs: Temp: 98 F (36.7 C) (11/30 2042) Temp Source: Oral (11/30 2042) BP: 131/68 (11/30 2042) Pulse Rate: 64 (11/30 2042)  Labs: Recent Labs    02/26/22 1810 02/26/22 2026 02/27/22 0100 02/27/22 1949  HGB 12.6  --   --   --   HCT 39.3  --   --   --   PLT 241  --   --   --   HEPARINUNFRC  --   --   --  0.67  CREATININE 0.82  --  0.88  --   TROPONINIHS 30* 43* 27*  --      Estimated Creatinine Clearance: 55.1 mL/min (by C-G formula based on SCr of 0.88 mg/dL).   Medical History: Past Medical History:  Diagnosis Date   Anginal pain ([place])    Arthritis    Asthma    At high risk for falls 11/11/2017   Chest pain 11/09/2015   CHF (congestive heart failure) (HCC)    Chronic diastolic congestive heart failure ([place]) 11/11/2015   Chronic insomnia 10/24/2015   Chronic pain of both knees 05/14/2018   Chronic respiratory failure with hypoxia ([place]) 05/05/2017   CKD (chronic kidney disease) stage 3, GFR 30-59 ml/min (HCC) 05/07/2017   COPD (chronic obstructive pulmonary disease) ([place])    Coronary artery disease    Coronary artery disease involving  native coronary artery of native heart without angina pectoris 11/14/2014   Overview:  40% RCA a cardiac catheter in 2014   Current mild episode of major depressive disorder ([place]) 11/14/2014   Depression    DOE (dyspnea on exertion) 09/07/2018   Dysphagia 08/12/2018   Edema 08/12/2018   Elevated blood sugar 11/09/2015   Emphysema lung (HCC)    Flat feet, bilateral 05/30/2020   Gastroesophageal reflux disease without esophagitis 11/11/2017   GERD (gastroesophageal reflux disease)    Headache    History of hiatal hernia    Hyperlipidemia    Hypertension    Insomnia    Internal hemorrhoids 11/26/2018   Iron deficiency anemia 08/12/2018   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/26/2018   Mesenteric mass 02/19/2015   Midsternal chest pain 11/11/2015   Mixed hyperlipidemia 11/14/2014   Morbid obesity ([place]) 09/07/2018   OSA (obstructive sleep apnea) 12/16/2019   Osteoporosis    Panlobular emphysema ([place]) 11/09/2015   Pneumonia 2013   PONV (postoperative nausea and vomiting)    Positive D-dimer 09/08/2018   Restless legs syndrome 03/04/2018   Senile purpura ([place]) 08/12/2018   Shortness of breath dyspnea    with exertion   Stasis edema of left lower extremity 04/13/2018   Strain of right shoulder 08/12/2018   Type 2 diabetes mellitus with stage 3  chronic kidney disease ([place]) 08/14/2015   Unstable angina ([place]) 10/30/2018   Vitamin B12 deficiency 04/13/2018    Assessment: 70 YOF presenting with CP, SOB, in AFib with CHADS-VASc of 7, she is not on anticoagulation PTA  Heparin level came back therapeutic tonight. We will cont with the current rate and check in AM.  Goal of Therapy:  Heparin level 0.3-0.7 units/ml Monitor platelets by anticoagulation protocol: Yes   Plan:  Cont heparin 1200 units/hr F/u AM heparin level F/u transition to [place], PharmD, BCIDP, AAHIVP, CPP Infectious Disease Pharmacist 02/27/2022 8:53 PM

## 2022-02-27 NOTE — Assessment & Plan Note (Signed)
Continue blood pressure control with metoprolol.  

## 2022-02-27 NOTE — Assessment & Plan Note (Addendum)
Atrial flutter.  Patient was placed on amiodarone converting to sinus rhythm.  Patient has been sinus bradycardia 50 to 60 bpm.   Placed on apixaban for anticoagulation. Continue metoprolol at 25 mg daily and follow up with cardiology as outpatient.  TSH with mild elevation, but patient has no hyperthyroid symptoms, and she has other more strong risk factors for atrial fibrillation, including COPD, advance age and obesity.

## 2022-02-27 NOTE — Progress Notes (Signed)
ANTICOAGULATION CONSULT NOTE - Initial Consult  Pharmacy Consult for heparin Indication: atrial fibrillation  Allergies  Allergen Reactions   Codeine Swelling   Levofloxacin     Other reaction(s): Malaise (intolerance)   Penicillins Swelling    Has patient had a PCN reaction causing immediate rash, facial/tongue/throat swelling, SOB or lightheadedness with hypotension:YES Has patient had a PCN reaction causing severe rash involving mucus membranes or skin necrosis: NO Has patient had a PCN reaction that required hospitalization NO Has patient had a PCN reaction occurring within the last 10 years: NO If all of the above answers are "NO", then may proceed with Cephalosporin use.   Tape Rash    Patient Measurements: Height: '5\' 3"'$  (160 cm) Weight: 111.5 kg (245 lb 13 oz) IBW/kg (Calculated) : 52.4 Heparin Dosing Weight: 79kg  Vital Signs: Temp: 98.1 F (36.7 C) (11/30 1058) Temp Source: Oral (11/30 1058) BP: 160/58 (11/30 1100) Pulse Rate: 73 (11/30 1100)  Labs: Recent Labs    02/26/22 1810 02/26/22 2026 02/27/22 0100  HGB 12.6  --   --   HCT 39.3  --   --   PLT 241  --   --   CREATININE 0.82  --  0.88  TROPONINIHS 30* 43* 27*    Estimated Creatinine Clearance: 55.1 mL/min (by C-G formula based on SCr of 0.88 mg/dL).   Medical History: Past Medical History:  Diagnosis Date   Anginal pain ([place])    Arthritis    Asthma    At high risk for falls 11/11/2017   Chest pain 11/09/2015   CHF (congestive heart failure) (HCC)    Chronic diastolic congestive heart failure ([place]) 11/11/2015   Chronic insomnia 10/24/2015   Chronic pain of both knees 05/14/2018   Chronic respiratory failure with hypoxia ([place]) 05/05/2017   CKD (chronic kidney disease) stage 3, GFR 30-59 ml/min (HCC) 05/07/2017   COPD (chronic obstructive pulmonary disease) (HCC)    Coronary artery disease    Coronary artery disease involving native coronary artery of native heart without angina pectoris 11/14/2014    Overview:  40% RCA a cardiac catheter in 2014   Current mild episode of major depressive disorder ([place]) 11/14/2014   Depression    DOE (dyspnea on exertion) 09/07/2018   Dysphagia 08/12/2018   Edema 08/12/2018   Elevated blood sugar 11/09/2015   Emphysema lung (HCC)    Flat feet, bilateral 05/30/2020   Gastroesophageal reflux disease without esophagitis 11/11/2017   GERD (gastroesophageal reflux disease)    Headache    History of hiatal hernia    Hyperlipidemia    Hypertension    Insomnia    Internal hemorrhoids 11/26/2018   Iron deficiency anemia 08/12/2018   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/26/2018   Mesenteric mass 02/19/2015   Midsternal chest pain 11/11/2015   Mixed hyperlipidemia 11/14/2014   Morbid obesity ([place]) 09/07/2018   OSA (obstructive sleep apnea) 12/16/2019   Osteoporosis    Panlobular emphysema ([place]) 11/09/2015   Pneumonia 2013   PONV (postoperative nausea and vomiting)    Positive D-dimer 09/08/2018   Restless legs syndrome 03/04/2018   Senile purpura ([place]) 08/12/2018   Shortness of breath dyspnea    with exertion   Stasis edema of left lower extremity 04/13/2018   Strain of right shoulder 08/12/2018   Type 2 diabetes mellitus with stage 3 chronic kidney disease ([place]) 08/14/2015   Unstable angina ([place]) 10/30/2018   Vitamin B12 deficiency 04/13/2018    Assessment: 89 YOF presenting with CP, SOB, in AFib with  CHADS-VASc of 7, she is not on anticoagulation PTA  Goal of Therapy:  Heparin level 0.3-0.7 units/ml Monitor platelets by anticoagulation protocol: Yes   Plan:  Heparin 3000 units IV x 1 (low end bolus with DVT ppx lovenox administered this AM), and gtt at 1200 units/hr F/u 8 hour heparin level F/u transition to [place], PharmD, [place] Pharmacist ED Pharmacist Phone # [phone] 02/27/2022 12:00 PM

## 2022-02-27 NOTE — Assessment & Plan Note (Addendum)
Hyponatremia, hypokalemia.   Patient with improvement in volume status, at the time of her discharge her serum cr is 1.0, K is 3,4 and serum bicarbonate at 29. Plan to continue diuresis with oral furosemide and added spironolactone.  Follow up renal function as outpatient.

## 2022-02-27 NOTE — Assessment & Plan Note (Addendum)
Chronic hypoxemic respiratory failure. No clinical signs of exacerbation. Continue supplemental 02 per [place].   Patient will need outpatient swallow evaluation, per speech therapy recommendations.

## 2022-02-27 NOTE — ED Notes (Signed)
Pt c/o chest tightness Dr Cathlean Sauer  was made aware.

## 2022-02-27 NOTE — Evaluation (Signed)
Clinical/Bedside Swallow Evaluation Patient Details  Name: Danielle [name] MRN: [id number] Date of Birth: [birth year]  Today's Date: 02/27/2022 Time: SLP Start Time (ACUTE ONLY): 5732 SLP Stop Time (ACUTE ONLY): 1140 SLP Time Calculation (min) (ACUTE ONLY): 14 min  Past Medical History:  Past Medical History:  Diagnosis Date   Anginal pain ([place])    Arthritis    Asthma    At high risk for falls 11/11/2017   Chest pain 11/09/2015   CHF (congestive heart failure) (HCC)    Chronic diastolic congestive heart failure ([place]) 11/11/2015   Chronic insomnia 10/24/2015   Chronic pain of both knees 05/14/2018   Chronic respiratory failure with hypoxia ([place]) 05/05/2017   CKD (chronic kidney disease) stage 3, GFR 30-59 ml/min (HCC) 05/07/2017   COPD (chronic obstructive pulmonary disease) ([place])    Coronary artery disease    Coronary artery disease involving native coronary artery of native heart without angina pectoris 11/14/2014   Overview:  40% RCA a cardiac catheter in 2014   Current mild episode of major depressive disorder ([place]) 11/14/2014   Depression    DOE (dyspnea on exertion) 09/07/2018   Dysphagia 08/12/2018   Edema 08/12/2018   Elevated blood sugar 11/09/2015   Emphysema lung (HCC)    Flat feet, bilateral 05/30/2020   Gastroesophageal reflux disease without esophagitis 11/11/2017   GERD (gastroesophageal reflux disease)    Headache    History of hiatal hernia    Hyperlipidemia    Hypertension    Insomnia    Internal hemorrhoids 11/26/2018   Iron deficiency anemia 08/12/2018   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/26/2018   Mesenteric mass 02/19/2015   Midsternal chest pain 11/11/2015   Mixed hyperlipidemia 11/14/2014   Morbid obesity ([place]) 09/07/2018   OSA (obstructive sleep apnea) 12/16/2019   Osteoporosis    Panlobular emphysema ([place]) 11/09/2015   Pneumonia 2013   PONV (postoperative nausea and vomiting)    Positive D-dimer 09/08/2018   Restless legs syndrome 03/04/2018   Senile purpura ([place]) 08/12/2018    Shortness of breath dyspnea    with exertion   Stasis edema of left lower extremity 04/13/2018   Strain of right shoulder 08/12/2018   Type 2 diabetes mellitus with stage 3 chronic kidney disease ([place]) 08/14/2015   Unstable angina ([place]) 10/30/2018   Vitamin B12 deficiency 04/13/2018   Past Surgical History:  Past Surgical History:  Procedure Laterality Date   ABDOMINAL HYSTERECTOMY     APPENDECTOMY     BREAST SURGERY  40 yrs. ago   growth removed in each breast-benign   CARDIAC CATHETERIZATION     12/14/12 [facility] ([facility]): few mild stenotic areas max 40% of ectatic RCA o/w NL coronaries, EF 65%. Med tx.   CATARACT EXTRACTION W/ INTRAOCULAR LENS  IMPLANT, BILATERAL Bilateral 15 yrs ago   CHOLECYSTECTOMY     COLONOSCOPY  04/22/2013   Diverticulosis in the sigmoid colon. Non bleeding internal hemorrhoids. Normal mucosa vascular pattern in the entire examined colon. Three 6 mm polyps n the descending colon. Resected and retrieved.    FOOT SURGERY     LAPAROSCOPIC APPENDECTOMY N/A 02/19/2015   Procedure: EXCISION OF MESENTERIC MASS;  Surgeon: Stark Klein, MD;  Location: [facility];  Service: General;  Laterality: N/A;   REPLACEMENT TOTAL KNEE BILATERAL     HPI:  Danielle [name] is a 88 y.o. female who presented to ED 11/29 after developing chest pain. Cardiology consulted. CXR 11/29 concerning for RLL pna. Pt with a past medical history of COPD, chronic respiratory  failure with hypoxia on home oxygen, coronary artery disease, morbid obesity, diabetes mellitus type 2, chronic kidney disease stage IIIa, hyperlipidemia    Assessment / Plan / Recommendation  Clinical Impression  Pt presents with functional swallowing as assessed clinically.  Pt tolerated all consistencies trialed without any clinical s/s of aspiration; however, a concern for prandial aspiration still exists given family reports of choking with PO intake, and CXR 11/29 concerning for RLL pneumonia.  A MBS is recommended for further  evaluation of swallow function.  Family reports Dr Lyndel Safe recommend outpatient swallow study (possibly barium esophagram based on family description) without follow up.  Pt exhibited good oral clearance of solids despite edentulism and reports she does not typically wear dentures with PO intake.  Will plan for MBSS next date.    Pt may advance to regular texture diet with thin liquid pending results of MBS.  SLP Visit Diagnosis: Dysphagia, unspecified (R13.10)    Aspiration Risk  Mild aspiration risk    Diet Recommendation Regular;Thin liquid   Liquid Administration via: Cup;Straw Medication Administration: Whole meds with liquid Supervision: Patient able to self feed Compensations: Slow rate;Small sips/bites Postural Changes: Seated upright at 90 degrees    Other  Recommendations Oral Care Recommendations: Oral care BID    Recommendations for follow up therapy are one component of a multi-disciplinary discharge planning process, led by the attending physician.  Recommendations may be updated based on patient status, additional functional criteria and insurance authorization.  Follow up Recommendations  (TBD)      Assistance Recommended at Discharge    Functional Status Assessment  (TBD)  Frequency and Duration  (TBD)          Prognosis Prognosis for Safe Diet Advancement:  (TBD)      Swallow Study   General Date of Onset: 02/26/22 HPI: Danielle [name] is a 88 y.o. female who presented to ED 11/29 after developing chest pain. Cardiology consulted. CXR 11/29 concerning for RLL pna. Pt with a past medical history of COPD, chronic respiratory failure with hypoxia on home oxygen, coronary artery disease, morbid obesity, diabetes mellitus type 2, chronic kidney disease stage IIIa, hyperlipidemia Type of Study: Bedside Swallow Evaluation Previous Swallow Assessment: None Diet Prior to this Study: Dysphagia 3 (soft);Thin liquids Temperature Spikes Noted: No History of Recent  Intubation: No Behavior/Cognition: Alert;Cooperative;Pleasant mood Oral Cavity Assessment: Dry Oral Care Completed by SLP: No Oral Cavity - Dentition: Edentulous;Dentures, not available (does not always wear with POs) Vision: Functional for self-feeding Baseline Vocal Quality: Normal Volitional Cough: Strong Volitional Swallow: Able to elicit    Oral/Motor/Sensory Function Overall Oral Motor/Sensory Function: Mild impairment Facial ROM: Within Functional Limits Lingual ROM: Within Functional Limits Lingual Symmetry: Within Functional Limits Lingual Strength: Within Functional Limits Velum: Within Functional Limits Mandible: Within Functional Limits   Ice Chips Ice chips: Not tested   Thin Liquid Thin Liquid: Within functional limits Presentation: Straw    Nectar Thick Nectar Thick Liquid: Not tested   Honey Thick Honey Thick Liquid: Not tested   Puree Puree: Within functional limits Presentation: Spoon   Solid     Solid: Within functional limits Presentation: [place], [place], [place] Office: [phone] 02/27/2022,1:36 PM

## 2022-02-27 NOTE — Consult Note (Addendum)
Cardiology Consultation   Patient ID: [name] MRN: [id number]; DOB: Jan 02, 1934  Admit date: 02/26/2022 Date of Consult: 02/27/2022  PCP:  [name], MD   [place] Providers Cardiologist:  [name], MD      Patient Profile:   Danielle [name] is a 88 y.o. female with a hx of COPD and chronic respiratory failure on home oxygen, CAD, type 2 DM, morbid obesity, CKD stage IIIa, HLD, chronic diastolic heart failure, history of tobacco abuse who is being seen 02/27/2022 for the evaluation of CHF and tachycardia at the request of Dr. Maryland Pink.  History of Present Illness:   Danielle [name] is an 89 year old female with above medical history who is followed by Dr. Agustin Cree.   Per chart review, patient has a history of coronary artery disease and first underwent cardiac catheterization in 2014.  Cath showed 40% stenosis of the right coronary artery, does not appear that any stents were placed at that time.  She had a nuclear stress test in 10/2015 that showed a small area of stress-induced ischemia at the anteroseptal apex, EF 65%.  Later, a coronary CT scan on 10/11/2018 showed a coronary calcium score of 1190 (95th percentile), mild diffuse CAD in the entire RCA, proximal and mid LAD, as well as moderate CAD in the mid left circumflex artery.  Study was sent for [facility] which did not show any significant stenosis.  An echocardiogram on 10/18/2018 showed EF 60-65%, Doppler parameters consistent with impaired relaxation, normal RV systolic function.  Patient was last seen by cardiology on 01/25/2022.  At that time, patient did complain of dyspnea on exertion and atypical chest pain.  Troponin T and pro BNP were within normal limits.  Patient was sent for echocardiogram on 02/04/2022 that showed EF 60-65%, no regional wall motion abnormalities, grade 1 diastolic dysfunction, normal RV systolic function, normal pulmonary artery systolic pressure.  Patient was  brought to the ER by EMS on 11/29.  Patient was complaining of chest pain and shortness of breath. She was noted to be tachycardic on arrival with HR in the 120s. She was given a breathing treatment and HR improved and chest pain resolved. Labs in the ED showed Na 137, K 3.6, creatinine 0.82, WBC 10.5, hemoglobin 12.6, platelets 241. hsTn 30>>43>>27. BNP within normal limits at 59.7.   Initial EKG in the ED showed and ectopic atrial tachycardia with HR 131 BPM. Follow up EKG showed atrial fibrillation with HR 91 BPM. CXR showed airspace opacity in the periphery of the right lower lobe, suspicious for pneumonia.  CTA chest showed no signs of pulmonary embolism, chronic bronchitis/reactive airway possibly secondary to aspiration, air trapping, scattered mucous plugs in the lower lungs. Patient was admitted to the hospitalist service, and cardiology was asked to consult for evaluation of chest pain, tachyarrhythmia.   On interview, patient reports that yesterday she had finished eating lunch and was sitting down when she felt very intense centralized chest pain.  Chest pain was ranked a 10/10 on the pain scale.  Pain radiated to jaw, also felt in the shoulders, back.  Pain lasted for approximately an hour and improved with breathing treatment, heart rate management.  She has not had this chest pain since coming to the ED.  Recently, patient denies having chest pain on exertion.  She was seen by Dr. Agustin Cree in October of this year and did complain of chest pain at that point, but she was told her workup was normal.  Patient is unsure if she felt short of breath.  Patient denies cough, palpitations, fever, chills, body aches, nausea, vomiting.  Denies headache.  She has a history of diverticulitis and will occasionally have blood clots in her stools.  Past Medical History:  Diagnosis Date   Anginal pain ([place])    Arthritis    Asthma    At high risk for falls 11/11/2017   Chest pain 11/09/2015   CHF  (congestive heart failure) (HCC)    Chronic diastolic congestive heart failure ([place]) 11/11/2015   Chronic insomnia 10/24/2015   Chronic pain of both knees 05/14/2018   Chronic respiratory failure with hypoxia ([place]) 05/05/2017   CKD (chronic kidney disease) stage 3, GFR 30-59 ml/min (HCC) 05/07/2017   COPD (chronic obstructive pulmonary disease) ([place])    Coronary artery disease    Coronary artery disease involving native coronary artery of native heart without angina pectoris 11/14/2014   Overview:  40% RCA a cardiac catheter in 2014   Current mild episode of major depressive disorder ([place]) 11/14/2014   Depression    DOE (dyspnea on exertion) 09/07/2018   Dysphagia 08/12/2018   Edema 08/12/2018   Elevated blood sugar 11/09/2015   Emphysema lung (HCC)    Flat feet, bilateral 05/30/2020   Gastroesophageal reflux disease without esophagitis 11/11/2017   GERD (gastroesophageal reflux disease)    Headache    History of hiatal hernia    Hyperlipidemia    Hypertension    Insomnia    Internal hemorrhoids 11/26/2018   Iron deficiency anemia 08/12/2018   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/26/2018   Mesenteric mass 02/19/2015   Midsternal chest pain 11/11/2015   Mixed hyperlipidemia 11/14/2014   Morbid obesity ([place]) 09/07/2018   OSA (obstructive sleep apnea) 12/16/2019   Osteoporosis    Panlobular emphysema ([place]) 11/09/2015   Pneumonia 2013   PONV (postoperative nausea and vomiting)    Positive D-dimer 09/08/2018   Restless legs syndrome 03/04/2018   Senile purpura ([place]) 08/12/2018   Shortness of breath dyspnea    with exertion   Stasis edema of left lower extremity 04/13/2018   Strain of right shoulder 08/12/2018   Type 2 diabetes mellitus with stage 3 chronic kidney disease ([place]) 08/14/2015   Unstable angina ([place]) 10/30/2018   Vitamin B12 deficiency 04/13/2018    Past Surgical History:  Procedure Laterality Date   ABDOMINAL HYSTERECTOMY     APPENDECTOMY     BREAST SURGERY  40 yrs. ago   growth removed in each breast-benign    CARDIAC CATHETERIZATION     12/14/12 LHC ([facility]): few mild stenotic areas max 40% of ectatic RCA o/w NL coronaries, EF 65%. Med tx.   CATARACT EXTRACTION W/ INTRAOCULAR LENS  IMPLANT, BILATERAL Bilateral 15 yrs ago   CHOLECYSTECTOMY     COLONOSCOPY  04/22/2013   Diverticulosis in the sigmoid colon. Non bleeding internal hemorrhoids. Normal mucosa vascular pattern in the entire examined colon. Three 6 mm polyps n the descending colon. Resected and retrieved.    FOOT SURGERY     LAPAROSCOPIC APPENDECTOMY N/A 02/19/2015   Procedure: EXCISION OF MESENTERIC MASS;  Surgeon: Stark Klein, MD;  Location: [facility];  Service: General;  Laterality: N/A;   REPLACEMENT TOTAL KNEE BILATERAL       Home Medications:  Prior to Admission medications   Medication Sig Start Date End Date Taking? Authorizing Provider  levofloxacin (LEVAQUIN) 750 MG tablet Take 750 mg by mouth daily. 02/18/22  Yes [provider]  predniSONE (DELTASONE) 20 MG tablet Take  40 mg by mouth daily. 02/06/22  Yes [provider]  albuterol (PROVENTIL HFA;VENTOLIN HFA) 108 (90 BASE) MCG/ACT inhaler Inhale 2 puffs into the lungs every 6 (six) hours as needed for wheezing or shortness of breath.     [provider]  aspirin 81 MG tablet Take 81 mg by mouth daily.    [provider]  budesonide-formoterol (SYMBICORT) 160-4.5 MCG/ACT inhaler Inhale 2 puffs into the lungs 2 (two) times daily.    [provider]  citalopram (CELEXA) 20 MG tablet Take 20 mg by mouth daily. 10/12/16   [provider]  clonazePAM (KLONOPIN) 1 MG tablet Take 1 mg by mouth as needed for anxiety (sleep). 11/20/14   [provider]  diclofenac Sodium (VOLTAREN) 1 % GEL APPLY 2 GRAMS TOPICALLY TO AFFECTED AREA THREE TIMES DAILY Patient taking differently: Apply 2 g topically 4 (four) times daily. 08/08/19   Newt Minion, MD  furosemide (LASIX) 20 MG tablet Take 3 tablets (60 mg total) by mouth daily. 01/28/22  04/28/22  Park Liter, MD  glimepiride (AMARYL) 4 MG tablet Take 4 mg by mouth daily.    [provider]  JANUVIA 25 MG tablet Take 100 mg by mouth daily. 12/22/18   [provider]  metFORMIN (GLUCOPHAGE-XR) 500 MG 24 hr tablet Take 500 mg by mouth daily. 07/09/19   [provider]  metoprolol succinate (TOPROL-XL) 25 MG 24 hr tablet Take 25 mg by mouth daily. 11/20/14   [provider]  nitroGLYCERIN (NITROSTAT) 0.4 MG SL tablet Place 1 tablet (0.4 mg total) under the tongue every 5 (five) minutes as needed for chest pain. 08/22/20 01/17/22  Park Liter, MD  NYSTATIN powder Apply topically 3 (three) times daily. 11/15/21   [provider]  pantoprazole (PROTONIX) 40 MG tablet Take 40 mg by mouth 2 (two) times daily. 11/14/16   [provider]  potassium chloride (MICRO-K) 10 MEQ CR capsule Take 10 mEq by mouth daily. 11/20/14   [provider]  ranolazine (RANEXA) 500 MG 12 hr tablet Take 1 tablet (500 mg total) by mouth in the morning. 01/29/22   Park Liter, MD  rOPINIRole (REQUIP) 0.25 MG tablet Take 0.25 mg by mouth at bedtime.    [provider]  rosuvastatin (CRESTOR) 40 MG tablet Take 40 mg by mouth daily.    [provider]  Vitamin D, Ergocalciferol, (DRISDOL) 50000 UNITS CAPS capsule Take 50,000 Units by mouth every Sunday. No specific days    [provider]    Inpatient Medications: Scheduled Meds:  aspirin EC  81 mg Oral Daily   budesonide (PULMICORT) nebulizer solution  0.25 mg Nebulization BID   citalopram  20 mg Oral Daily   doxycycline  100 mg Oral Q12H   enoxaparin (LOVENOX) injection  40 mg Subcutaneous Q24H   furosemide  60 mg Oral Daily   insulin aspart  0-15 Units Subcutaneous TID WC   ipratropium  0.5 mg Nebulization Q6H   levalbuterol  0.63 mg Nebulization Q6H   metoprolol succinate  25 mg Oral Daily   pantoprazole  40 mg Oral BID   potassium chloride  10 mEq  Oral Daily   predniSONE  60 mg Oral Q breakfast   ranolazine  500 mg Oral q AM   rOPINIRole  0.25 mg Oral QHS   rosuvastatin  40 mg Oral Daily   Continuous Infusions:  PRN Meds: acetaminophen **OR** acetaminophen, clonazePAM, metoprolol tartrate, ondansetron **OR** ondansetron (ZOFRAN) IV  Allergies:    Allergies  Allergen Reactions   Codeine Swelling   Levofloxacin     Other reaction(s): Malaise (intolerance)   Penicillins Swelling    Has patient had a PCN reaction causing immediate rash, facial/tongue/throat swelling, SOB or lightheadedness with hypotension:YES Has patient had a PCN reaction causing severe rash involving mucus membranes or skin necrosis: NO Has patient had a PCN reaction that required hospitalization NO Has patient had a PCN reaction occurring within the last 10 years: NO If all of the above answers are "NO", then may proceed with Cephalosporin use.   Tape Rash    Social History:   Social History   Socioeconomic History   Marital status: Married    Spouse name: Not on file   Number of children: Not on file   Years of education: Not on file   Highest education level: Not on file  Occupational History   Not on file  Tobacco Use   Smoking status: Former    Packs/day: 0.25    Types: Cigarettes    Quit date: 08/21/2016    Years since quitting: 5.5   Smokeless tobacco: Never  Vaping Use   Vaping Use: Never used  Substance and Sexual Activity   Alcohol use: No   Drug use: No   Sexual activity: Not on file  Other Topics Concern   Not on file  Social History Narrative   Not on file   Social Determinants of Health   Financial Resource Strain: Not on file  Food Insecurity: Not on file  Transportation Needs: Not on file  Physical Activity: Not on file  Stress: Not on file  Social Connections: Not on file  Intimate Partner Violence: Not on file    Family History:    Family History  Problem Relation Age of Onset   Heart attack Mother       ROS:  Please see the history of present illness.   All other ROS reviewed and negative.     Physical Exam/Data:   Vitals:   02/27/22 0845 02/27/22 0920 02/27/22 1000 02/27/22 1058  BP:  130/68 (!) 122/57   Pulse: 84 76 72   Resp: 16 (!) 23 (!) 22   Temp:    98.1 F (36.7 C)  TempSrc:    Oral  SpO2: 97% 100% 93%   Weight:      Height:        Intake/Output Summary (Last 24 hours) at 02/27/2022 1114 Last data filed at 02/26/2022 2304 Gross per 24 hour  Intake 351.48 ml  Output --  Net 351.48 ml      02/26/2022    6:28 PM 01/17/2022    3:42 PM 08/06/2021    1:15 PM  Last 3 Weights  Weight (lbs) 245 lb 13 oz 245 lb 12.8 oz 246 lb 9.6 oz  Weight (kg) 111.5 kg 111.494 kg 111.857 kg     Body mass index is 43.54 kg/m.  General: Elderly female laying flat in the bed.  Appears uncomfortable, but in no acute distress.  Wearing nasal cannula HEENT: normal Neck: no JVD Vascular: No carotid bruits; radial pulses 2+ bilaterally Cardiac:  normal S1, S2; RRR; no murmur Lungs:  clear to auscultation bilaterally, no wheezing, rhonchi or rales.  Normal work of breathing on 2 L supplemental oxygen via nasal cannula Abd: soft, nontender, no hepatomegaly  Ext: 2+ pitting edema.  Lower extremities are tender to palpation Musculoskeletal:  No deformities, BUE and BLE strength normal and equal  Skin: warm and dry  Neuro:  CNs 2-12 intact, no focal abnormalities noted Psych:  Normal affect   EKG:  The EKG was personally reviewed and demonstrates: Initial EKG showed a narrow complex tachycardia with heart rate 131 bpm.  Follow-up EKG showed what appears to be atrial flutter with heart rate 92 bpm Telemetry:  Telemetry was personally reviewed and demonstrates:  Predominantly normal sinus rhythm. Occasional episodes of tachycardia to the 130s with flutter waves present   Relevant CV Studies:  Echocardiogram 02/04/2022  1. Left ventricular ejection fraction, by estimation, is 60 to 65%. The   left ventricle has normal function. The left ventricle has no regional  wall motion abnormalities. Left ventricular diastolic parameters are  consistent with Grade I diastolic  dysfunction (impaired relaxation).   2. Right ventricular systolic function is normal. The right ventricular  size is normal. There is normal pulmonary artery systolic pressure.   3. The mitral valve is normal in structure. No evidence of mitral valve  regurgitation. No evidence of mitral stenosis.   4. The aortic valve is calcified. Aortic valve regurgitation is not  visualized. Aortic valve sclerosis/calcification is present, without any  evidence of aortic stenosis.   5. The inferior vena cava is normal in size with greater than 50%  respiratory variability, suggesting right atrial pressure of 3 mmHg.   Laboratory Data:  High Sensitivity Troponin:   Recent Labs  Lab 02/26/22 1810 02/26/22 2026 02/27/22 0100  TROPONINIHS 30* 43* 27*     Chemistry Recent Labs  Lab 02/26/22 1810 02/27/22 0100  NA 137 134*  K 3.6 4.4  CL 98 99  CO2 24 24  GLUCOSE 249* 391*  BUN 20 20  CREATININE 0.82 0.88  CALCIUM 8.9 8.7*  GFRNONAA >60 >60  ANIONGAP 15 11    Recent Labs  Lab 02/26/22 1810 02/27/22 0100  PROT 6.2* 5.7*  ALBUMIN 3.8 3.4*  AST 27 25  ALT 19 20  ALKPHOS 56 55  BILITOT 0.3 0.1*   Lipids  Recent Labs  Lab 02/27/22 0100  CHOL 155  TRIG 142  HDL 39*  LDLCALC 88  CHOLHDL 4.0    Hematology Recent Labs  Lab 02/26/22 1810  WBC 10.5  RBC 4.48  HGB 12.6  HCT 39.3  MCV 87.7  MCH 28.1  MCHC 32.1  RDW 14.1  PLT 241   Thyroid No results for input(s): "TSH", "FREET4" in the last 168 hours.  BNP Recent Labs  Lab 02/26/22 1810  BNP 59.7    DDimer No results for input(s): "DDIMER" in the last 168 hours.   Radiology/Studies:  CT Angio Chest PE W and/or Wo Contrast  Result Date: 02/26/2022 CLINICAL DATA:  High probability of pulmonary embolism EXAM: CT ANGIOGRAPHY CHEST WITH  CONTRAST TECHNIQUE: Multidetector CT imaging of the chest was performed using the standard protocol during bolus administration of intravenous contrast. Multiplanar CT image reconstructions and MIPs were obtained to evaluate the vascular anatomy. RADIATION DOSE REDUCTION: This exam was performed according to the departmental dose-optimization program which includes automated exposure control, adjustment of the mA and/or kV according to patient size and/or use of iterative reconstruction technique. CONTRAST:  38m OMNIPAQUE IOHEXOL 350 MG/ML SOLN COMPARISON:  Radiographs earlier today CT chest angiogram 09/21/2021 FINDINGS: Cardiovascular: Satisfactory opacification of the pulmonary arteries to the segmental level. No evidence of pulmonary embolism. Mild cardiomegaly. No pericardial effusion. Coronary artery and aortic athero sclerotic calcification. Mediastinum/Nodes: No enlarged mediastinal, hilar, or axillary lymph nodes. Thyroid gland, trachea, and  esophagus demonstrate no significant findings. Lungs/Pleura: Mosaic attenuation compatible with air trapping. Atelectasis or infiltrates in the lower lungs. Mild bronchial wall thickening in the lower lungs. Scattered mucous plugs in the lower lungs. Scattered pulmonary nodules measuring up to 6 mm in the anterior right upper lobe (series 6/62 and series 6/77). These appear triangular in shape on coronal images and likely represents focal atelectasis or intrapulmonary lymph nodes. Upper Abdomen: No acute abnormality. Stable left adrenal nodule compatible with adenoma. No follow-up recommended. Musculoskeletal: No chest wall abnormality. No acute osseous findings. Thoracic spondylosis with multilevel bridging. Review of the MIP images confirms the above findings. IMPRESSION: Negative for acute pulmonary embolism. Chronic bronchitis/reactive airways possibly secondary to aspiration. Coronary artery and Aortic Atherosclerosis (ICD10-I70.0). Electronically Signed   By:  Placido Sou M.D.   On: 02/26/2022 21:20   DG Chest Portable 1 View  Result Date: 02/26/2022 CLINICAL DATA:  Shortness of breath EXAM: PORTABLE CHEST 1 VIEW COMPARISON:  12/23/2021 FINDINGS: Heart size within normal limits. Aortic atherosclerosis. Low lung volumes. Airspace opacity in the periphery of the right lower lobe. Mild left basilar atelectasis. No pleural effusion or pneumothorax. IMPRESSION: Airspace opacity in the periphery of the right lower lobe, suspicious for pneumonia. Electronically Signed   By: Davina Poke D.O.   On: 02/26/2022 18:19     Assessment and Plan:   Chest Pain  History of nonobstructive CAD  - Patient presented complaining of central chest pain that radiated to her jaw. Felt like pressure, very intense (10/10 on pain scale)  - Patient's most recent ischemic eval was a coronary CT in 09/2018 that showed a coronary calcium score of 1190 (95th percentile), mild diffuse CAD in the entire RCA, proximal and mid LAD, as well as moderate CAD in the mid Lcx artery. FFR showed no significant stenosis  - Patient was seen by cardiology on 10/20-- complained of chest pain at that time which was atypical.  Echocardiogram showed an normal EF and normal wall motion - hsTn 30>>43>>27 - Suspect that trop elevation is secondary to tachycardia and respiratory distress on presentation. However, she does have a history of CAD and it has been 3.5 years since last ischemic evaluation. She also now complains of more intense pain/pressure that radiated to her jaw and shoulders, and did complain of pain at her last office visit in 01/2022.  I believe she would benefit from a repeat ischemic evaluation, likely with coronary CT  Tachyarrhythmia  Atrial Flutter, paroxysmal  - Per telemetry, patient is predominantly in normal sinus rhythm.  However, she occasionally gets tachycardic with heart rate in the 120s-130s.  During episodes of tachycardia, there are flutter waves intermittently  visible - Given patient's underlying COPD, it is not unsurprising that patient is having episodes of atrial flutter.  Patient is asymptomatic when in atrial flutter - CHADS-VASc 7. Patient should be started on DOAC prior to discharge-- plan to start eliquis 5 mg BID after coronary CT/ once procedures complete  - Start IV heparin   - Increase metoprolol to 50 mg daily  - Start amiodarone 200 mg BID  - Check TSH  - Maintain K>4, mag>2  Chronic Diastolic Heart Failure  Lower Extremity Edema - Most recent echocardiogram from 01/2022 showed EF 63-87%, grade I diastolic dysfunction  - BNP within normal limits, chest x-ray without pulmonary edema - Suspect lower extremity edema is partially secondary to CHF, but also due to venus insufficiency. OK to continue lasix PRN   Risk Assessment/Risk Scores:  CHA2DS2-VASc Score = 7  This indicates a 11.2% annual risk of stroke. The patient's score is based upon: CHF History: 1 HTN History: 1 Diabetes History: 1 Stroke History: 0 Vascular Disease History: 1 Age Score: 2 Gender Score: 1       For questions or updates, please contact [place] Please consult [email or web address] for contact info under    Signed, Margie Billet, PA-C  02/27/2022 11:14 AM  Attending Note:   The patient was seen and examined.  Agree with assessment and plan as noted above.  Changes made to the above note as needed.  Patient seen and independently examined with Vikki Ports, PA .   We discussed all aspects of the encounter. I agree with the assessment and plan as stated above.     Chest pain :   has moderate CAD from Cor CTA from 2020. Would be reasonable to repeat study  Troponin was 27  Will keep her on heparin for now   2.   Atrial flutter :  has paroxysmal atrial flutter .   Seems to be asymptomatic.   Heparin for now. Amiodarone      I have spent a total of 40 minutes with patient reviewing hospital  notes , telemetry, EKGs,  labs and examining patient as well as establishing an assessment and plan that was discussed with the patient.  > 50% of time was spent in direct patient care.    Thayer Headings, Brooke Bonito., MD, [facility] 02/27/2022, 12:42 PM [address],  [place] Pager [phone]

## 2022-02-27 NOTE — Progress Notes (Addendum)
TRH night cross cover note:   I was notified by RN of the patient's QHS CBG of [phone]. Patient without qhs SSI coverage. I subsequently placed order for qhs sliding scale insulin coverage.  Consult to diabetes coordinator also placed for assistance with patient education in setting of poorly controlled diabetes.    Babs Bertin, DO Hospitalist

## 2022-02-27 NOTE — Evaluation (Signed)
Physical Therapy Evaluation Patient Details Name: Danielle [name] MRN: [id number] DOB: Jun 24, 1933 Today's Date: 02/27/2022  History of Present Illness  88 yo female presents to [facility] on 11/29 with chest pain. CXR shows RLL suspicious for PNA. PMH includes recent UTI s/p treatment, COPD, chronic respiratory failure with hypoxia on home oxygen, coronary artery disease, morbid obesity, diabetes mellitus type 2, chronic kidney disease stage IIIa, hyperlipidemia.  Clinical Impression   Pt presents with LE weakness, impaired balance, impaired gait, dyspnea on exertion (SpO2 88% and greater on RA when accurate pleth), and decreased activity tolerance. Pt to benefit from acute PT to address deficits. Pt ambulated good hallway distance with use of RW, pt limited by fatigue and weakness. No reports of chest pain. PT to progress mobility as tolerated, and will continue to follow acutely.         Recommendations for follow up therapy are one component of a multi-disciplinary discharge planning process, led by the attending physician.  Recommendations may be updated based on patient status, additional functional criteria and insurance authorization.  Follow Up Recommendations Home health PT      Assistance Recommended at Discharge Set up Supervision/Assistance  Patient can return home with the following  A little help with walking and/or transfers;A little help with bathing/dressing/bathroom;Direct supervision/assist for medications management;Assistance with cooking/housework    Equipment Recommendations None recommended by PT  Recommendations for Other Services       Functional Status Assessment Patient has had a recent decline in their functional status and demonstrates the ability to make significant improvements in function in a reasonable and predictable amount of time.     Precautions / Restrictions Precautions Precautions: Fall Precaution Comments: watch HR, sats Restrictions Weight  Bearing Restrictions: No      Mobility  Bed Mobility Overal bed mobility: Needs Assistance Bed Mobility: Supine to Sit     Supine to sit: HOB elevated, Supervision     General bed mobility comments: for safety, use of bedrails and HOB elevation    Transfers Overall transfer level: Needs assistance Equipment used: Rolling walker (2 wheels) Transfers: Sit to/from Stand Sit to Stand: Min guard           General transfer comment: for safety    Ambulation/Gait Ambulation/Gait assistance: Min guard Gait Distance (Feet): 170 Feet Assistive device: Rolling walker (2 wheels) Gait Pattern/deviations: Step-through pattern, Decreased stride length, Trunk flexed Gait velocity: decr     General Gait Details: cues for upright posture, proximity to RW. DOE 2/4, SpO2 when waveform is accurate 88% and above on RA  Stairs            Wheelchair Mobility    Modified Rankin (Stroke Patients Only)       Balance Overall balance assessment: Needs assistance Sitting-balance support: No upper extremity supported, Feet supported Sitting balance-Leahy Scale: Fair     Standing balance support: Bilateral upper extremity supported, During functional activity, Reliant on assistive device for balance Standing balance-Leahy Scale: Poor                               Pertinent Vitals/Pain Pain Assessment Pain Assessment: Faces Faces Pain Scale: Hurts a little bit Pain Location: bilat LEs Pain Descriptors / Indicators: Sore, Other (Comment) (chronic) Pain Intervention(s): Limited activity within patient's tolerance, Monitored during session, Repositioned    Home Living Family/patient expects to be discharged to:: Private residence Living Arrangements: Alone Available Help at Discharge: Family;Available PRN/intermittently  Type of Home: House Home Access: Stairs to enter   [employer] of Steps: 2   Home Layout: One level Home Equipment: Grab bars -  tub/shower;Grab bars - toilet;Shower seat;Toilet riser;Cane - single point;Rolling Walker (2 wheels);BSC/3in1      Prior Function Prior Level of Function : Needs assist             Mobility Comments: uses a RW ADLs Comments: daughter does her medicine, bills, cooking. and cleaning     Hand Dominance   Dominant Hand: Right    Extremity/Trunk Assessment   Upper Extremity Assessment Upper Extremity Assessment: Defer to OT evaluation    Lower Extremity Assessment Lower Extremity Assessment: Generalized weakness    Cervical / Trunk Assessment Cervical / Trunk Assessment: Kyphotic  Communication   Communication: No difficulties  Cognition Arousal/Alertness: Awake/alert Behavior During Therapy: WFL for tasks assessed/performed Overall Cognitive Status: Impaired/Different from baseline Area of Impairment: Orientation, Following commands                 Orientation Level: Disoriented to, Person     Following Commands: Follows one step commands with increased time       General Comments: pt looks to family when PT asks what her birthday is        General Comments      Exercises     Assessment/Plan    PT Assessment Patient needs continued PT services  PT Problem List Decreased strength;Decreased mobility;Decreased activity tolerance;Decreased balance;Decreased knowledge of use of DME;Pain;Decreased safety awareness;Cardiopulmonary status limiting activity       PT Treatment Interventions DME instruction;Therapeutic activities;Gait training;Therapeutic exercise;Patient/family education;Balance training;Stair training;Functional mobility training;Neuromuscular re-education    PT Goals (Current goals can be found in the Care Plan section)  Acute Rehab PT Goals Patient Stated Goal: home PT Goal Formulation: With patient Time For Goal Achievement: 03/13/22 Potential to Achieve Goals: Good    Frequency Min 3X/week     Co-evaluation                AM-PAC PT "6 Clicks" Mobility  Outcome Measure Help needed turning from your back to your side while in a flat bed without using bedrails?: A Little Help needed moving from lying on your back to sitting on the side of a flat bed without using bedrails?: A Little Help needed moving to and from a bed to a chair (including a wheelchair)?: A Little Help needed standing up from a chair using your arms (e.g., wheelchair or bedside chair)?: A Little Help needed to walk in hospital room?: A Little Help needed climbing 3-5 steps with a railing? : A Lot 6 Click Score: 17    End of Session   Activity Tolerance: Patient tolerated treatment well Patient left: in chair;with call bell/phone within reach;with family/visitor present;Other (comment) (in front of sink in chair waiting for NT for bath) Nurse Communication: Mobility status PT Visit Diagnosis: Other abnormalities of gait and mobility (R26.89);Muscle weakness (generalized) (M62.81)    Time: 0017-4944 PT Time Calculation (min) (ACUTE ONLY): 16 min   Charges:   PT Evaluation $PT Eval Low Complexity: 1 Low         Clydia Nieves S, PT DPT Acute Rehabilitation Services Pager [phone]  Office [phone]   Louis Matte 02/27/2022, 4:34 PM

## 2022-02-27 NOTE — Assessment & Plan Note (Addendum)
Uncontrolled diabetes mellitus with hyperglycemia.   Patient was placed on insulin therapy for glucose control, at the time of her discharge she will resume oral hypoglycemic agents.   Continue with statin therapy.

## 2022-02-27 NOTE — ED Notes (Signed)
MD [facility] notified of patient glucose 391, new orders to start insulin dosing earlier.

## 2022-02-28 ENCOUNTER — Observation Stay (HOSPITAL_COMMUNITY): Payer: PPO

## 2022-02-28 ENCOUNTER — Other Ambulatory Visit: Payer: Self-pay | Admitting: Cardiology

## 2022-02-28 ENCOUNTER — Inpatient Hospital Stay (HOSPITAL_COMMUNITY)
Admit: 2022-02-28 | Discharge: 2022-02-28 | Disposition: A | Discharge status: Home / Self Care | Payer: PPO | Attending: Cardiology | Admitting: Cardiology

## 2022-02-28 ENCOUNTER — Other Ambulatory Visit (HOSPITAL_COMMUNITY): Payer: Self-pay

## 2022-02-28 DIAGNOSIS — N1831 Chronic kidney disease, stage 3a: Secondary | ICD-10-CM | POA: Diagnosis present

## 2022-02-28 DIAGNOSIS — G2581 Restless legs syndrome: Secondary | ICD-10-CM | POA: Diagnosis present

## 2022-02-28 DIAGNOSIS — J431 Panlobular emphysema: Secondary | ICD-10-CM | POA: Diagnosis present

## 2022-02-28 DIAGNOSIS — E1122 Type 2 diabetes mellitus with diabetic chronic kidney disease: Secondary | ICD-10-CM | POA: Diagnosis present

## 2022-02-28 DIAGNOSIS — R931 Abnormal findings on diagnostic imaging of heart and coronary circulation: Secondary | ICD-10-CM | POA: Diagnosis not present

## 2022-02-28 DIAGNOSIS — Z9981 Dependence on supplemental oxygen: Secondary | ICD-10-CM | POA: Diagnosis not present

## 2022-02-28 DIAGNOSIS — J9621 Acute and chronic respiratory failure with hypoxia: Secondary | ICD-10-CM | POA: Diagnosis present

## 2022-02-28 DIAGNOSIS — E876 Hypokalemia: Secondary | ICD-10-CM | POA: Diagnosis not present

## 2022-02-28 DIAGNOSIS — I5033 Acute on chronic diastolic (congestive) heart failure: Secondary | ICD-10-CM | POA: Diagnosis present

## 2022-02-28 DIAGNOSIS — D509 Iron deficiency anemia, unspecified: Secondary | ICD-10-CM | POA: Diagnosis present

## 2022-02-28 DIAGNOSIS — E871 Hypo-osmolality and hyponatremia: Secondary | ICD-10-CM | POA: Diagnosis present

## 2022-02-28 DIAGNOSIS — Z20822 Contact with and (suspected) exposure to covid-19: Secondary | ICD-10-CM | POA: Diagnosis present

## 2022-02-28 DIAGNOSIS — E782 Mixed hyperlipidemia: Secondary | ICD-10-CM | POA: Diagnosis present

## 2022-02-28 DIAGNOSIS — I4892 Unspecified atrial flutter: Secondary | ICD-10-CM

## 2022-02-28 DIAGNOSIS — I4719 Other supraventricular tachycardia: Secondary | ICD-10-CM | POA: Diagnosis present

## 2022-02-28 DIAGNOSIS — I2510 Atherosclerotic heart disease of native coronary artery without angina pectoris: Secondary | ICD-10-CM

## 2022-02-28 DIAGNOSIS — I4891 Unspecified atrial fibrillation: Secondary | ICD-10-CM | POA: Insufficient documentation

## 2022-02-28 DIAGNOSIS — I130 Hypertensive heart and chronic kidney disease with heart failure and stage 1 through stage 4 chronic kidney disease, or unspecified chronic kidney disease: Secondary | ICD-10-CM | POA: Diagnosis present

## 2022-02-28 DIAGNOSIS — J42 Unspecified chronic bronchitis: Secondary | ICD-10-CM | POA: Diagnosis present

## 2022-02-28 DIAGNOSIS — D735 Infarction of spleen: Secondary | ICD-10-CM | POA: Diagnosis present

## 2022-02-28 DIAGNOSIS — E1169 Type 2 diabetes mellitus with other specified complication: Secondary | ICD-10-CM | POA: Diagnosis present

## 2022-02-28 DIAGNOSIS — I25118 Atherosclerotic heart disease of native coronary artery with other forms of angina pectoris: Secondary | ICD-10-CM

## 2022-02-28 DIAGNOSIS — K5792 Diverticulitis of intestine, part unspecified, without perforation or abscess without bleeding: Secondary | ICD-10-CM | POA: Diagnosis present

## 2022-02-28 DIAGNOSIS — F329 Major depressive disorder, single episode, unspecified: Secondary | ICD-10-CM | POA: Diagnosis present

## 2022-02-28 DIAGNOSIS — E1165 Type 2 diabetes mellitus with hyperglycemia: Secondary | ICD-10-CM | POA: Diagnosis present

## 2022-02-28 DIAGNOSIS — Z6841 Body Mass Index (BMI) 40.0 and over, adult: Secondary | ICD-10-CM | POA: Diagnosis not present

## 2022-02-28 DIAGNOSIS — I480 Paroxysmal atrial fibrillation: Secondary | ICD-10-CM | POA: Diagnosis present

## 2022-02-28 LAB — CBC
HCT: 35.2 % — ABNORMAL LOW (ref 36.0–46.0)
Hemoglobin: 11.4 g/dL — ABNORMAL LOW (ref 12.0–15.0)
MCH: 27.6 pg (ref 26.0–34.0)
MCHC: 32.4 g/dL (ref 30.0–36.0)
MCV: 85.2 fL (ref 80.0–100.0)
Platelets: 230 10*3/uL (ref 150–400)
RBC: 4.13 MIL/uL (ref 3.87–5.11)
RDW: 14.5 % (ref 11.5–15.5)
WBC: 10 10*3/uL (ref 4.0–10.5)
nRBC: 0 % (ref 0.0–0.2)

## 2022-02-28 LAB — GLUCOSE, CAPILLARY
Glucose-Capillary: 227 mg/dL — ABNORMAL HIGH (ref 70–99)
Glucose-Capillary: 298 mg/dL — ABNORMAL HIGH (ref 70–99)

## 2022-02-28 LAB — BASIC METABOLIC PANEL
Anion gap: 10 (ref 5–15)
BUN: 23 mg/dL (ref 8–23)
CO2: 30 mmol/L (ref 22–32)
Calcium: 9.1 mg/dL (ref 8.9–10.3)
Chloride: 98 mmol/L (ref 98–111)
Creatinine, Ser: 0.91 mg/dL (ref 0.44–1.00)
GFR, Estimated: >60 mL/min (ref >60)
Glucose, Bld: 271 mg/dL — ABNORMAL HIGH (ref 70–99)
Potassium: 3.5 mmol/L (ref 3.5–5.1)
Sodium: 138 mmol/L (ref 135–145)

## 2022-02-28 LAB — HEPARIN LEVEL (UNFRACTIONATED): Heparin Unfractionated: 0.67 IU/mL (ref 0.30–0.70)

## 2022-02-28 LAB — MAGNESIUM: Magnesium: 2.2 mg/dL (ref 1.7–2.4)

## 2022-02-28 MED ORDER — MENTHOL 3 MG MT LOZG: 1.0000 | LOZENGE | OROMUCOSAL | Status: DC | PRN

## 2022-02-28 MED ORDER — SPIRONOLACTONE 12.5 MG HALF TABLET
12.5000 mg | ORAL_TABLET | Freq: Every day | ORAL | Status: DC
  Administered 2022-02-28 – 2022-03-01 (×2): 12.5 mg via ORAL
  Filled 2022-02-28 (×2): qty 1

## 2022-02-28 MED ORDER — APIXABAN 5 MG PO TABS
5.0000 mg | ORAL_TABLET | Freq: Two times a day (BID) | ORAL | Status: DC
  Administered 2022-02-28 – 2022-03-01 (×2): 5 mg via ORAL
  Filled 2022-02-28 (×2): qty 1

## 2022-02-28 MED ORDER — NITROGLYCERIN 0.4 MG SL SUBL
SUBLINGUAL_TABLET | SUBLINGUAL | Status: AC
  Filled 2022-02-28: qty 2

## 2022-02-28 MED ORDER — INSULIN GLARGINE-YFGN 100 UNIT/ML ~~LOC~~ SOLN
15.0000 [IU] | Freq: Every day | SUBCUTANEOUS | Status: DC
  Administered 2022-02-28 – 2022-03-01 (×2): 15 [IU] via SUBCUTANEOUS
  Filled 2022-02-28 (×2): qty 0.15

## 2022-02-28 MED ORDER — IOHEXOL 350 MG/ML SOLN
95.0000 mL | Freq: Once | INTRAVENOUS | Status: AC | PRN
  Administered 2022-02-28: 95 mL via INTRAVENOUS

## 2022-02-28 MED ORDER — POLYETHYLENE GLYCOL 3350 17 G PO PACK
17.0000 g | PACK | Freq: Every day | ORAL | Status: DC
  Administered 2022-02-28 – 2022-03-01 (×2): 17 g via ORAL
  Filled 2022-02-28 (×2): qty 1

## 2022-02-28 MED ORDER — IPRATROPIUM BROMIDE 0.02 % IN SOLN: 0.5000 mg | Freq: Two times a day (BID) | RESPIRATORY_TRACT | Status: DC

## 2022-02-28 MED ORDER — LEVALBUTEROL HCL 0.63 MG/3ML IN NEBU: 0.6300 mg | INHALATION_SOLUTION | Freq: Two times a day (BID) | RESPIRATORY_TRACT | Status: DC

## 2022-02-28 MED ORDER — APIXABAN 5 MG PO TABS: 5.0000 mg | ORAL_TABLET | Freq: Two times a day (BID) | ORAL | Status: DC

## 2022-02-28 NOTE — Progress Notes (Addendum)
[place] for heparin Indication: atrial fibrillation  Allergies  Allergen Reactions   Codeine Swelling   Levofloxacin     Other reaction(s): Malaise (intolerance)   Penicillins Swelling    Has patient had a PCN reaction causing immediate rash, facial/tongue/throat swelling, SOB or lightheadedness with hypotension:YES Has patient had a PCN reaction causing severe rash involving mucus membranes or skin necrosis: NO Has patient had a PCN reaction that required hospitalization NO Has patient had a PCN reaction occurring within the last 10 years: NO If all of the above answers are "NO", then may proceed with Cephalosporin use.   Tape Rash    Patient Measurements: Height: '5\' 3"'$  (160 cm) Weight: 111.5 kg (245 lb 13 oz) IBW/kg (Calculated) : 52.4 Heparin Dosing Weight: 79kg  Vital Signs: Temp: 97.9 F (36.6 C) (12/01 0807) Temp Source: Oral (12/01 0807) BP: 116/53 (12/01 0807) Pulse Rate: 57 (12/01 0815)  Labs: Recent Labs    02/26/22 1810 02/26/22 2026 02/27/22 0100 02/27/22 1949 02/28/22 0247  HGB 12.6  --   --   --  11.4*  HCT 39.3  --   --   --  35.2*  PLT 241  --   --   --  230  HEPARINUNFRC  --   --   --  0.67 0.67  CREATININE 0.82  --  0.88  --  0.91  TROPONINIHS 30* 43* 27*  --   --      Estimated Creatinine Clearance: 53.2 mL/min (by C-G formula based on SCr of 0.91 mg/dL).   Medical History: Past Medical History:  Diagnosis Date   Anginal pain ([place])    Arthritis    Asthma    At high risk for falls 11/11/2017   Chest pain 11/09/2015   CHF (congestive heart failure) (HCC)    Chronic diastolic congestive heart failure ([place]) 11/11/2015   Chronic insomnia 10/24/2015   Chronic pain of both knees 05/14/2018   Chronic respiratory failure with hypoxia ([place]) 05/05/2017   CKD (chronic kidney disease) stage 3, GFR 30-59 ml/min (HCC) 05/07/2017   COPD (chronic obstructive pulmonary disease) ([place])    Coronary artery disease     Coronary artery disease involving native coronary artery of native heart without angina pectoris 11/14/2014   Overview:  40% RCA a cardiac catheter in 2014   Current mild episode of major depressive disorder ([place]) 11/14/2014   Depression    DOE (dyspnea on exertion) 09/07/2018   Dysphagia 08/12/2018   Edema 08/12/2018   Elevated blood sugar 11/09/2015   Emphysema lung (HCC)    Flat feet, bilateral 05/30/2020   Gastroesophageal reflux disease without esophagitis 11/11/2017   GERD (gastroesophageal reflux disease)    Headache    History of hiatal hernia    Hyperlipidemia    Hypertension    Insomnia    Internal hemorrhoids 11/26/2018   Iron deficiency anemia 08/12/2018   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/26/2018   Mesenteric mass 02/19/2015   Midsternal chest pain 11/11/2015   Mixed hyperlipidemia 11/14/2014   Morbid obesity ([place]) 09/07/2018   OSA (obstructive sleep apnea) 12/16/2019   Osteoporosis    Panlobular emphysema ([place]) 11/09/2015   Pneumonia 2013   PONV (postoperative nausea and vomiting)    Positive D-dimer 09/08/2018   Restless legs syndrome 03/04/2018   Senile purpura ([place]) 08/12/2018   Shortness of breath dyspnea    with exertion   Stasis edema of left lower extremity 04/13/2018   Strain of right shoulder 08/12/2018  Type 2 diabetes mellitus with stage 3 chronic kidney disease ([place]) 08/14/2015   Unstable angina ([place]) 10/30/2018   Vitamin B12 deficiency 04/13/2018    Assessment: 58 YOF presenting with CP, SOB, in AFib with CHADS-VASc of 7, she is not on anticoagulation PTA. Plans noted for CCTA then DOAC if no further procedures  -heparin level at goal on 1200 units/hr  Goal of Therapy:  Heparin level 0.3-0.7 units/ml Monitor platelets by anticoagulation protocol: Yes   Plan:  Cont heparin 1200 units/hr Daily heparin level and CBC  Hildred Laser, PharmD Clinical Pharmacist **Pharmacist phone directory can now be found on [email or web address] ([email or web address]).  Listed under [place].  Addendum -plans to  change to apixaban -SCr= 0.91, wt= 11kg  Plans -discontinue heparin -Apixaban '5mg'$  po bid  Hildred Laser, PharmD Clinical Pharmacist **Pharmacist phone directory can now be found on [email or web address] ([email or web address]).  Listed under [place].

## 2022-02-28 NOTE — Discharge Instructions (Signed)

## 2022-02-28 NOTE — TOC Benefit Eligibility Note (Signed)
Patient Teacher, English as a foreign language completed.    The patient is currently admitted and upon discharge could be taking Eliquis 5 mg.  The current 30 day co-pay is $10.35.   The patient is currently admitted and upon discharge could be taking Xarelto 20 mg.  The current 30 day co-pay is $10.35.   The patient is currently admitted and upon discharge could be taking Farxiga 10 mg.  The current 30 day co-pay is $10.35.   The patient is currently admitted and upon discharge could be taking Jardiance 10 mg.  The current 30 day co-pay is $10.35.   The patient is insured through [place], [place] Patient Advocate Specialist [place] Patient Advocate Team Direct Number: [phone]  Fax: [phone]

## 2022-02-28 NOTE — Inpatient Diabetes Management (Addendum)
Inpatient Diabetes Program Recommendations  AACE/ADA: New Consensus Statement on Inpatient Glycemic Control (2015)  Target Ranges:  Prepandial:   less than 140 mg/dL      Peak postprandial:   less than 180 mg/dL (1-2 hours)      Critically ill patients:  140 - 180 mg/dL   Lab Results  Component Value Date   GLUCAP 298 (H) 02/28/2022   HGBA1C 10.2 (H) 02/27/2022    Latest Reference Range & Units 02/27/22 18:36 02/27/22 20:45 02/28/22 08:09 02/28/22 12:11  Glucose-Capillary 70 - 99 mg/dL 369 (H) 358 (H) 227 (H) 298 (H)  (H): Data is abnormally high Review of Glycemic Control  Diabetes history: type 2 Outpatient Diabetes medications: Amaryl 4 mg daily, Januvia 100 mg daily, Metformin 500 mg BID Current orders for Inpatient glycemic control: Novolog 0-15 units TID correction scale, Novolog 0-5 units HS scale  Inpatient Diabetes Program Recommendations:   Noted that blood sugars have been greater than 200 mg/dl.  Recommend adding Semglee 15 units daily and continue Novolog 0-15 units correction scale TID and Novolog HS scale if blood sugars continue to be elevated.   ADDENDUM: spoke with patient at the bedside. Patient states that she does not know how long she has had diabetes.  States that she takes Metformin, Amaryl, and Januvia.  Her daughter sets up her pills to take each day. Patient lives alone. Daughter lives about 1 mile from her home and checks on her everyday. Her daughter will bring her lunch most days, but patient makes her own breakfast and hardly every eats dinner. States that she sleeps in a lift chair at night, but has frequent trips to the bathroom at night. States that she does not sleep much at night.   Patient states that her blood sugars usually run in the 300's at home. She checks blood sugars once a day, does not drink sweet drinks or eat sweets. She states that she is careful about not eating white foods such as potatoes, rice, and only eats brown bread. Patient does  not get exercise. Patient wears a alert bracelet for calling for help which she used recently prior to this admission. Patient does have a fear of falling.   Discussed HgbA1C of 10.2%. Patient not aware of past A1C results. States that she does have a PCP that follows her diabetes. She is interested in talking with her doctor about weekly injections for losing weight. Discussed the plate method for eating and given information.   Will continue to monitor blood sugars while in the hospital.  Harvel Ricks RN BSN CDE Diabetes Coordinator Pager: [phone]  8am-5pm

## 2022-02-28 NOTE — Care Management Obs Status (Signed)
[facility] NOTIFICATION   Patient Details  Name: Danielle [name] MRN: [id number] Date of Birth: 03-08-1934   Medicare Observation Status Notification Given:  Yes    Bethena Roys, RN 02/28/2022, 10:17 AM

## 2022-02-28 NOTE — Progress Notes (Signed)
Progress Note   Patient: Danielle [name] QPR:[id number] DOB: 05-Mar-1934 DOA: 02/26/2022     0 DOS: the patient was seen and examined on 02/28/2022   Brief hospital course: Danielle [name] was admitted to the hospital with the working diagnosis of chest pain in the setting of tachyarrhythmia.   88 yo female with the past medical history of COPD, chronic hypoxemic respiratory failure, coronary artery disease, T2Dm, CKD, obesity and dyslipidemia who presented with chest pain. Precordial chest pain, 10/10 radiated to the jaw, pressure in nature. She called EMS, she was found tachycardic, received metoprolol and was transported to the ED. On her initial physical examination her blood pressure was 109/74, HR 94, RR 22 and 02 saturation 99%, lungs with no wheezing or rales, heart with S1 and S2 present and rhythmic, abdomen with no distention, no lower extremity edema.   Na 137, K 3,6 Cl 98 bicarbonate 24, glucose 249, bun 20 cr 0,82  High sensitive troponin 30 and 43  Wbc 10,5 hgb 12,6 plt 241 Sars covid 19 negative  Urine analysis SG 1,008, negative protein, 21-50 wbc   Chest radiograph with no infiltrates, or effusions  CT chest with faint bilateral ground glass opacities, negative for pulmonary embolism.   EKG 131 bpm, left axis deviation, normal qtc, SVT rhythm with no significant ST segment or T wave changes.  EKG 92 bpm, left axis, normal qtc, atrial fibrillation rhythm with no significant ST segment or T wave changes.   Patient placed on heparin drip and amiodarone for rate control.   11/30 patient converted to sinus rhythm.  12/01 cardiac CT for ischemic work up.   Assessment and Plan: * Paroxysmal atrial fibrillation (HCC) Atrial flutter.  Patient continue with sinus rhythm, telemetry personally reviewed rate 50 to 60 bpm.   Plan to continue rate control with amiodarone and anticoagulation with apixaban if CT cardiac with no major coronary artery disease that may require  intervention.   Continue with aspirin for now.   Acute on chronic diastolic CHF (congestive heart failure) (HCC) Echocardiogram with preserved LV systolic function with EF 60 to 99%, RV systolic function preserved, RVSP 31.3, no significant valvular disease.  Urine output is documented at 750 ml.  Systolic blood pressure is 116 to 140 mmHg.   Continue diuresis with furosemide IV Continue metoprolol.  Add spironolactone 12,5 mg daily if blood pressure tolerates may add ARB.    Stage 3a chronic kidney disease (HCC) Hyponatremia,   Volume status is improving, today her serum cr is 0,91 with K at 3,5 and serum bicarbonate at 30. Na 138.   Plan to continue diuresis with furosemide and add spironolactone.   Coronary artery disease involving native coronary artery of native heart without angina pectoris No further chest pain Plan to continue medical therapy and follow up on CT cardiac.   COPD (chronic obstructive pulmonary disease) (HCC) Chronic hypoxemic respiratory failure. No clinica signs of exacerbation. Continue supplemental 02 per [name].   Positive sings of aspiration, plan for further swallow evaluation.   Type 2 diabetes mellitus with hyperlipidemia (HCC) Uncontrolled diabetes mellitus with hyperglycemia.   Fasting glucose this am 271 mg/dl. Plan to continue insulin sliding scale for glucose cover and monitoring. Add basal insulin 15 units.   Continue with statin therapy.   Iron deficiency anemia Cell count has been stable   Hypertension Continue blood pressure control with metoprolol.   Class 3 obesity (HCC) Calculated BMI is 43,5         Subjective:  Patient with no chest pain, dyspnea is improving, no palpitations. Tolerating po well.   Physical Exam: Vitals:   02/28/22 0807 02/28/22 0815 02/28/22 0821 02/28/22 1208  BP: (!) 116/53   (!) 138/54  Pulse: (!) 56 (!) 57  86  Resp: '17 19  18  '$ Temp: 97.9 F (36.6 C)   97.8 F (36.6 C)  TempSrc: Oral    Oral  SpO2: 97% 100% 100%   Weight:      Height:       Neurology awake and alert ENT with mild pallor Cardiovascular with S1 and S2 present and bradycardic with no gallops, rubs or murmurs Respiratory with mild scattered rales with no wheezing Abdomen with no distention  Positive lower extremity edema non pitting  Data Reviewed:    Family Communication: I spoke with patient's daughter at the bedside, we talked in detail about patient's condition, plan of care and prognosis and all questions were addressed.   Disposition: Status is: Inpatient Remains inpatient appropriate because: heart failure and atrial fibrillation   Planned Discharge Destination: Home     Author: Tawni Millers, MD 02/28/2022 12:43 PM  For on call review [email or web address].

## 2022-02-28 NOTE — TOC Initial Note (Signed)
Transition of Care [facility]) - Initial/Assessment Note    Patient Details  Name: Danielle [name] MRN: [id number] Date of Birth: 1934/01/07  Transition of Care [facility]) CM/SW Contact:    Bethena Roys, RN Phone Number: 02/28/2022, 2:57 PM  Clinical Narrative:  Patient presented for chest pain. PTA patient was from home alone. Patient has support of the daughter that visits the patient daily. Patient assists with meals and gets grocery. Patient has DME RW, rollator, and bedside commode. Medicare.gov list provided to the patient and daughter chose [facility]- office declined due to patient may need an outpatient service. Case Manager then called [facility] and they can service the patient. Start of care to begin within 24-48 hours post transition home. Case Manager will continue to follow for transition of care needs.               Expected Discharge Plan: [place] Barriers to Discharge: Continued Medical Work up   Patient Goals and CMS Choice Patient states their goals for this hospitalization and ongoing recovery are:: to return home with family support. CMS Medicare.gov Compare Post Acute Care list provided to:: Patient Choice offered to / list presented to : Patient, Adult Children  Expected Discharge Plan and Services Expected Discharge Plan: [place] In-house Referral: NA Discharge Planning Services: CM Consult Post Acute Care Choice: [place] arrangements for the past 2 months: Single Family Home                   DME Agency: NA       HH Arranged: PT, OT HH Agency: [place] Date [place]: 02/28/22 Time HH Agency Contacted: 100 Representative spoke with at [place]: Amy  Prior Living Arrangements/Services Living arrangements for the past 2 months: [place] with:: Self (daughter checks in on the patient.) Patient language and need for interpreter reviewed::  Yes Do you feel safe going back to the place where you live?: Yes      Need for Family Participation in Patient Care: Yes (Comment) Care giver support system in place?: Yes (comment) Current home services: DME (has rolling walker, rollator, and bedside commode.) Criminal Activity/Legal Involvement Pertinent to Current Situation/Hospitalization: No - Comment as needed  Activities of Daily Living Home Assistive Devices/Equipment: Eyeglasses, Grab bars in shower, Raised toilet seat with rails, Oxygen, Shower chair with back, Nebulizer, Walker (specify type), Dentures (specify type) (rollader and front wheel walker) ADL Screening (condition at time of admission) Patient's cognitive ability adequate to safely complete daily activities?: Yes Is the patient deaf or have difficulty hearing?: No Does the patient have difficulty seeing, even when wearing glasses/contacts?: No Does the patient have difficulty concentrating, remembering, or making decisions?: Yes Patient able to express need for assistance with ADLs?: Yes Does the patient have difficulty dressing or bathing?: No Independently performs ADLs?: Yes (appropriate for developmental age) Does the patient have difficulty walking or climbing stairs?: Yes Weakness of Legs: Both Weakness of Arms/Hands: Both  Permission Sought/Granted Permission sought to share information with : Family Supports, Customer service manager, Case Optician, dispensing granted to share information with : Yes, Verbal Permission Granted     Permission granted to share info w AGENCY: [facility]        Emotional Assessment Appearance:: Appears stated age Attitude/Demeanor/Rapport: Engaged Affect (typically observed): Appropriate Orientation: : Oriented to Situation, Oriented to  Time, Oriented to Place, Oriented to Self Alcohol / Substance Use:  Not Applicable Psych Involvement: No (comment)  Admission diagnosis:  Atrial tachycardia [I47.19] COPD exacerbation  (HCC) [J44.1] Chest pain [R07.9] Acute respiratory failure, unspecified whether with hypoxia or hypercapnia ([place]) [J96.00] Community acquired pneumonia, unspecified laterality [J18.9] Atrial fibrillation ([place]) [I48.91] Patient Active Problem List   Diagnosis Date Noted   Atrial fibrillation ([place]) 02/28/2022   Paroxysmal atrial fibrillation ([place]) 02/27/2022   Acute on chronic diastolic CHF (congestive heart failure) ([place]) 02/27/2022   Multifocal atrial tachycardia 02/26/2022   Coronary artery disease    PONV (postoperative nausea and vomiting)    Osteoporosis    Insomnia    Hypertension    Hyperlipidemia    History of hiatal hernia    Headache    GERD (gastroesophageal reflux disease)    Emphysema lung (HCC)    COPD (chronic obstructive pulmonary disease) (HCC)    CHF (congestive heart failure) (HCC)    Asthma    Arthritis    Anginal pain ([place])    Callus 05/30/2020   Flat feet, bilateral 05/30/2020   Hammer toe of left foot 05/30/2020   OSA (obstructive sleep apnea) 12/16/2019   Internal hemorrhoids 11/26/2018   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/26/2018   Unstable angina (HCC) 10/30/2018   Positive D-dimer 09/08/2018   DOE (dyspnea on exertion) 09/07/2018   Class 3 obesity ([place]) 09/07/2018   Edema 08/12/2018   Iron deficiency anemia 08/12/2018   Senile purpura ([place]) 08/12/2018   Strain of right shoulder 08/12/2018   Dysphagia 08/12/2018   Chronic pain of both knees 05/14/2018   Stasis edema of left lower extremity 04/13/2018   Vitamin B12 deficiency 04/13/2018   Restless legs syndrome 03/04/2018   At high risk for falls 11/11/2017   Gastroesophageal reflux disease without esophagitis 11/11/2017   Stage 3a chronic kidney disease ([place]) 05/07/2017   Chronic respiratory failure with hypoxia ([place]) 05/05/2017   Midsternal chest pain 11/11/2015   Chronic diastolic congestive heart failure ([place]) 11/11/2015   Chest pain 11/09/2015   Depression 11/09/2015   Panlobular emphysema ([place])  11/09/2015   Elevated blood sugar 11/09/2015   Chronic insomnia 10/24/2015   Type 2 diabetes mellitus with hyperlipidemia ([place]) 08/14/2015   Mesenteric mass 02/19/2015   Coronary artery disease involving native coronary artery of native heart without angina pectoris 11/14/2014   Mixed hyperlipidemia 11/14/2014   Current mild episode of major depressive disorder ([place]) 11/14/2014   Pneumonia 2013   PCP:  [name], MD Pharmacy:   [facility] [place], [place] - [address] [place] [address] Phone: [phone] Fax: [phone]  Readmission Risk Interventions     No data to display

## 2022-02-28 NOTE — Progress Notes (Signed)
Cor CT results relayed from Dr. Gardiner Rhyme - challenging study, but nonobstructive disease. Overread also relays some concern of possible splenic infarct though not specifically diagnostic by this study. When relaying results to patient by phone she reports h/o vague abd pain for several months-years but no acute discomfort. Dr. Acie Fredrickson will be communicating his final recs to Dr. Cathlean Sauer. We will keep f/u 04/22/22 with Dr. Agustin Cree in [place] but also arrange sooner post-hospital appt 03/13/22 in [place] location (due to appt availbility) - patient agreeable to location. Outlined appt info on AVS.

## 2022-02-28 NOTE — Progress Notes (Signed)
SLP Cancellation Note  Patient Details Name: Danielle [name] MRN: [id number] DOB: 08/17/1933   Cancelled treatment:       Reason Eval/Treat Not Completed: Patient at procedure or test/unavailable. Attempted to get pt down for MBS this morning but per radiology, when they called for her she was having other testing done. Will reschedule MBS as able, although likely on subsequent date. Not sure how long pt may remain inpatient but could be scheduled for OP MBS if needed.    Osie Bond., M.A. [place] Office [phone]  Secure chat preferred  02/28/2022, 10:31 AM

## 2022-02-28 NOTE — Progress Notes (Addendum)
Rounding Note    Patient Name: Danielle [name] Date of Encounter: 02/28/2022  [place] Cardiologist: [name], MD    Subjective    88 yo  Admitted with chest pain ,  feeling better   Going to cor CTA this am  [place] 30,43, 27     Inpatient Medications    Scheduled Meds:  amiodarone  200 mg Oral BID   aspirin EC  81 mg Oral Daily   budesonide (PULMICORT) nebulizer solution  0.25 mg Nebulization BID   citalopram  20 mg Oral Daily   furosemide  40 mg Intravenous BID   insulin aspart  0-15 Units Subcutaneous TID WC   insulin aspart  0-5 Units Subcutaneous QHS   ipratropium  0.5 mg Nebulization TID   levalbuterol  0.63 mg Nebulization TID   metoprolol succinate  50 mg Oral Daily   pantoprazole  40 mg Oral BID   ranolazine  500 mg Oral q AM   rOPINIRole  0.25 mg Oral QHS   rosuvastatin  40 mg Oral Daily   Continuous Infusions:  heparin 1,200 Units/hr (02/28/22 0720)   PRN Meds: acetaminophen **OR** acetaminophen, clonazePAM, guaiFENesin-dextromethorphan, metoprolol tartrate, ondansetron **OR** ondansetron (ZOFRAN) IV   Vital Signs    Vitals:   02/28/22 0434 02/28/22 0807 02/28/22 0815 02/28/22 0821  BP: (!) 143/67 (!) 116/53    Pulse: 61 (!) 56 (!) 57   Resp: '16 17 19   '$ Temp: 97.8 F (36.6 C) 97.9 F (36.6 C)    TempSrc: Oral Oral    SpO2: 99% 97% 100% 100%  Weight:      Height:        Intake/Output Summary (Last 24 hours) at 02/28/2022 0933 Last data filed at 02/28/2022 0310 Gross per 24 hour  Intake 557.66 ml  Output 750 ml  Net -192.34 ml      02/26/2022    6:28 PM 01/17/2022    3:42 PM 08/06/2021    1:15 PM  Last 3 Weights  Weight (lbs) 245 lb 13 oz 245 lb 12.8 oz 246 lb 9.6 oz  Weight (kg) 111.5 kg 111.494 kg 111.857 kg      Telemetry     Sinus brady  - Personally Reviewed  ECG     - Personally Reviewed  Physical Exam   GEN: elderly female,  NAD  Neck: No JVD Cardiac: RRR, no murmurs, rubs, or gallops.   Respiratory: Clear to auscultation bilaterally. GI: Soft, nontender, non-distended  MS: No edema; No deformity. Neuro:  Nonfocal  Psych: Normal affect   Labs    High Sensitivity Troponin:   Recent Labs  Lab 02/26/22 1810 02/26/22 2026 02/27/22 0100  TROPONINIHS 30* 43* 27*     Chemistry Recent Labs  Lab 02/26/22 1810 02/27/22 0100 02/28/22 0247  NA 137 134* 138  K 3.6 4.4 3.5  CL 98 99 98  CO2 '24 24 30  '$ GLUCOSE 249* 391* 271*  BUN '20 20 23  '$ CREATININE 0.82 0.88 0.91  CALCIUM 8.9 8.7* 9.1  MG  --   --  2.2  PROT 6.2* 5.7*  --   ALBUMIN 3.8 3.4*  --   AST 27 25  --   ALT 19 20  --   ALKPHOS 56 55  --   BILITOT 0.3 0.1*  --   GFRNONAA >60 >60 >60  ANIONGAP '15 11 10    '$ Lipids  Recent Labs  Lab 02/27/22 0100  CHOL 155  TRIG 142  HDL 39*  LDLCALC 88  CHOLHDL 4.0    Hematology Recent Labs  Lab 02/26/22 1810 02/28/22 0247  WBC 10.5 10.0  RBC 4.48 4.13  HGB 12.6 11.4*  HCT 39.3 35.2*  MCV 87.7 85.2  MCH 28.1 27.6  MCHC 32.1 32.4  RDW 14.1 14.5  PLT 241 230   Thyroid  Recent Labs  Lab 02/27/22 0200  TSH 0.262*    BNP Recent Labs  Lab 02/26/22 1810  BNP 59.7    DDimer No results for input(s): "DDIMER" in the last 168 hours.   Radiology    CT Angio Chest PE W and/or Wo Contrast  Result Date: 02/26/2022 CLINICAL DATA:  High probability of pulmonary embolism EXAM: CT ANGIOGRAPHY CHEST WITH CONTRAST TECHNIQUE: Multidetector CT imaging of the chest was performed using the standard protocol during bolus administration of intravenous contrast. Multiplanar CT image reconstructions and MIPs were obtained to evaluate the vascular anatomy. RADIATION DOSE REDUCTION: This exam was performed according to the departmental dose-optimization program which includes automated exposure control, adjustment of the mA and/or kV according to patient size and/or use of iterative reconstruction technique. CONTRAST:  75m OMNIPAQUE IOHEXOL 350 MG/ML SOLN COMPARISON:   Radiographs earlier today CT chest angiogram 09/21/2021 FINDINGS: Cardiovascular: Satisfactory opacification of the pulmonary arteries to the segmental level. No evidence of pulmonary embolism. Mild cardiomegaly. No pericardial effusion. Coronary artery and aortic athero sclerotic calcification. Mediastinum/Nodes: No enlarged mediastinal, hilar, or axillary lymph nodes. Thyroid gland, trachea, and esophagus demonstrate no significant findings. Lungs/Pleura: Mosaic attenuation compatible with air trapping. Atelectasis or infiltrates in the lower lungs. Mild bronchial wall thickening in the lower lungs. Scattered mucous plugs in the lower lungs. Scattered pulmonary nodules measuring up to 6 mm in the anterior right upper lobe (series 6/62 and series 6/77). These appear triangular in shape on coronal images and likely represents focal atelectasis or intrapulmonary lymph nodes. Upper Abdomen: No acute abnormality. Stable left adrenal nodule compatible with adenoma. No follow-up recommended. Musculoskeletal: No chest wall abnormality. No acute osseous findings. Thoracic spondylosis with multilevel bridging. Review of the MIP images confirms the above findings. IMPRESSION: Negative for acute pulmonary embolism. Chronic bronchitis/reactive airways possibly secondary to aspiration. Coronary artery and Aortic Atherosclerosis (ICD10-I70.0). Electronically Signed   By: TPlacido SouM.D.   On: 02/26/2022 21:20   DG Chest Portable 1 View  Result Date: 02/26/2022 CLINICAL DATA:  Shortness of breath EXAM: PORTABLE CHEST 1 VIEW COMPARISON:  12/23/2021 FINDINGS: Heart size within normal limits. Aortic atherosclerosis. Low lung volumes. Airspace opacity in the periphery of the right lower lobe. Mild left basilar atelectasis. No pleural effusion or pneumothorax. IMPRESSION: Airspace opacity in the periphery of the right lower lobe, suspicious for pneumonia. Electronically Signed   By: NDavina PokeD.O.   On: 02/26/2022  18:19    Cardiac Studies      Patient Profile     88y.o. female    Assessment & Plan    1  chest pain :   had moderate CAD by Cor CTA in 2020 Repeat Cor CTA today .     Troponins are very minimially elevated but with a relatively flat trend   Cor CTA shows non obstructive CAD ,  FFR shows no significant stenosis   OK to go home    2.  Atrial flutter : is in sinus brady at this point   [place]   89(female, CAD, age 88 splenic infarcts, DM)  CT scan shows incidental splenic infarcts .  Transition from Heparin to Eliquis     Will follow up with APP in [place] in several weeks and then Dr. Raliegh Ip at previously scheduled appt in Jan.          For questions or updates, please contact [place] Please consult [email or web address] for contact info under        Signed, Mertie Moores, MD  02/28/2022, 9:33 AM

## 2022-02-28 NOTE — Progress Notes (Signed)
Mobility Specialist - Progress Note   02/28/22 1144  Mobility  Activity Ambulated with assistance in hallway  Level of Assistance Minimal assist, patient does 75% or more  Assistive Device Front wheel walker  Distance Ambulated (ft) 140 ft  Activity Response Tolerated well  Mobility Referral Yes  $Mobility charge 1 Mobility   Pt received on BSC and agreeable to mobility. Pt c/o being tired during ambulation. Pt was returned to bed with all needs met.   Franki Monte  Mobility Specialist Please contact via Solicitor or Rehab office at [phone]

## 2022-02-28 NOTE — Progress Notes (Signed)
ANTICOAGULATION CONSULT NOTE- follow-up Pharmacy Consult for heparin Indication: atrial fibrillation  Allergies  Allergen Reactions   Codeine Swelling   Levofloxacin     Other reaction(s): Malaise (intolerance)   Penicillins Swelling    Has patient had a PCN reaction causing immediate rash, facial/tongue/throat swelling, SOB or lightheadedness with hypotension:YES Has patient had a PCN reaction causing severe rash involving mucus membranes or skin necrosis: NO Has patient had a PCN reaction that required hospitalization NO Has patient had a PCN reaction occurring within the last 10 years: NO If all of the above answers are "NO", then may proceed with Cephalosporin use.   Tape Rash    Patient Measurements: Height: '5\' 3"'$  (160 cm) Weight: 111.5 kg (245 lb 13 oz) IBW/kg (Calculated) : 52.4 Heparin Dosing Weight: 79kg  Vital Signs: Temp: 97.9 F (36.6 C) (12/01 1300) Temp Source: Oral (12/01 1300) BP: 132/64 (12/01 1300) Pulse Rate: 86 (12/01 1208)  Labs: Recent Labs    02/26/22 1810 02/26/22 2026 02/27/22 0100 02/27/22 1949 02/28/22 0247  HGB 12.6  --   --   --  11.4*  HCT 39.3  --   --   --  35.2*  PLT 241  --   --   --  230  HEPARINUNFRC  --   --   --  0.67 0.67  CREATININE 0.82  --  0.88  --  0.91  TROPONINIHS 30* 43* 27*  --   --      Estimated Creatinine Clearance: 53.2 mL/min (by C-G formula based on SCr of 0.91 mg/dL).   Medical History: Past Medical History:  Diagnosis Date   Anginal pain ([place])    Arthritis    Asthma    At high risk for falls 11/11/2017   Chest pain 11/09/2015   CHF (congestive heart failure) (HCC)    Chronic diastolic congestive heart failure ([place]) 11/11/2015   Chronic insomnia 10/24/2015   Chronic pain of both knees 05/14/2018   Chronic respiratory failure with hypoxia ([place]) 05/05/2017   CKD (chronic kidney disease) stage 3, GFR 30-59 ml/min (HCC) 05/07/2017   COPD (chronic obstructive pulmonary disease) ([place])    Coronary artery disease     Coronary artery disease involving native coronary artery of native heart without angina pectoris 11/14/2014   Overview:  40% RCA a cardiac catheter in 2014   Current mild episode of major depressive disorder ([place]) 11/14/2014   Depression    DOE (dyspnea on exertion) 09/07/2018   Dysphagia 08/12/2018   Edema 08/12/2018   Elevated blood sugar 11/09/2015   Emphysema lung (HCC)    Flat feet, bilateral 05/30/2020   Gastroesophageal reflux disease without esophagitis 11/11/2017   GERD (gastroesophageal reflux disease)    Headache    History of hiatal hernia    Hyperlipidemia    Hypertension    Insomnia    Internal hemorrhoids 11/26/2018   Iron deficiency anemia 08/12/2018   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/26/2018   Mesenteric mass 02/19/2015   Midsternal chest pain 11/11/2015   Mixed hyperlipidemia 11/14/2014   Morbid obesity ([place]) 09/07/2018   OSA (obstructive sleep apnea) 12/16/2019   Osteoporosis    Panlobular emphysema ([place]) 11/09/2015   Pneumonia 2013   PONV (postoperative nausea and vomiting)    Positive D-dimer 09/08/2018   Restless legs syndrome 03/04/2018   Senile purpura ([place]) 08/12/2018   Shortness of breath dyspnea    with exertion   Stasis edema of left lower extremity 04/13/2018   Strain of right shoulder 08/12/2018  Type 2 diabetes mellitus with stage 3 chronic kidney disease ([place]) 08/14/2015   Unstable angina ([place]) 10/30/2018   Vitamin B12 deficiency 04/13/2018    Assessment: 69 YOF presenting with CP, SOB, in AFib with CHADS-VASc of 7, she is not on anticoagulation PTA. Plans noted for CCTA then DOAC if no further procedures.   Impressions from [place] 02/28/2022 12:56 Dr. Gardiner Rhyme MD    IMPRESSION: CAD-RADS 2. Mild non-obstructive CAD (25-49%). Consider non-atherosclerotic causes of chest pain. Consider preventive therapy and risk factor modification.  Goal of Therapy:  Monitor platelets by anticoagulation protocol: Yes  -plans to change to apixaban -SCr= 0.91, wt=  11kg  Plans -discontinue heparin -Apixaban '5mg'$  po bid Monitor for signs of bleeding Pharmacy will sign off of consult but continue to monitor making recommendations prn. Thank you for the consult.  Vaughan Basta BS, PharmD, BCPS Clinical Pharmacist 02/28/2022 4:19 PM  Contact: [phone] after 3 PM  "Be curious, not judgmental..." -Jamal Maes

## 2022-02-28 NOTE — Progress Notes (Signed)
Mobility Specialist - Progress Note   02/28/22 1050  Mobility  Activity Off unit   Pt currently off unit for CT scan. Will follow up if time permits.   Franki Monte  Mobility Specialist Please contact via Solicitor or Rehab office at [phone]

## 2022-03-01 DIAGNOSIS — I480 Paroxysmal atrial fibrillation: Secondary | ICD-10-CM | POA: Diagnosis not present

## 2022-03-01 DIAGNOSIS — I2510 Atherosclerotic heart disease of native coronary artery without angina pectoris: Secondary | ICD-10-CM | POA: Diagnosis not present

## 2022-03-01 DIAGNOSIS — N1831 Chronic kidney disease, stage 3a: Secondary | ICD-10-CM | POA: Diagnosis not present

## 2022-03-01 DIAGNOSIS — D509 Iron deficiency anemia, unspecified: Secondary | ICD-10-CM

## 2022-03-01 DIAGNOSIS — I5033 Acute on chronic diastolic (congestive) heart failure: Secondary | ICD-10-CM | POA: Diagnosis not present

## 2022-03-01 LAB — T4, FREE: Free T4: 0.68 ng/dL (ref 0.61–1.12)

## 2022-03-01 LAB — BASIC METABOLIC PANEL
Anion gap: 10 (ref 5–15)
BUN: 29 mg/dL — ABNORMAL HIGH (ref 8–23)
CO2: 29 mmol/L (ref 22–32)
Calcium: 8.8 mg/dL — ABNORMAL LOW (ref 8.9–10.3)
Chloride: 99 mmol/L (ref 98–111)
Creatinine, Ser: 1.08 mg/dL — ABNORMAL HIGH (ref 0.44–1.00)
GFR, Estimated: 50 mL/min — ABNORMAL LOW (ref >60)
Glucose, Bld: 217 mg/dL — ABNORMAL HIGH (ref 70–99)
Potassium: 3.4 mmol/L — ABNORMAL LOW (ref 3.5–5.1)
Sodium: 138 mmol/L (ref 135–145)

## 2022-03-01 MED ORDER — POTASSIUM CHLORIDE CRYS ER 20 MEQ PO TBCR
40.0000 meq | EXTENDED_RELEASE_TABLET | ORAL | Status: AC
  Administered 2022-03-01 (×2): 40 meq via ORAL
  Filled 2022-03-01 (×2): qty 2

## 2022-03-01 MED ORDER — APIXABAN 5 MG PO TABS: 5.0000 mg | ORAL_TABLET | Freq: Two times a day (BID) | ORAL | 0 refills | Status: DC

## 2022-03-01 MED ORDER — LEVALBUTEROL HCL 0.63 MG/3ML IN NEBU: 0.6300 mg | INHALATION_SOLUTION | Freq: Four times a day (QID) | RESPIRATORY_TRACT | Status: DC | PRN

## 2022-03-01 MED ORDER — AMIODARONE HCL 200 MG PO TABS: ORAL_TABLET | ORAL | 0 refills | Status: DC

## 2022-03-01 MED ORDER — IPRATROPIUM BROMIDE 0.02 % IN SOLN: 0.5000 mg | Freq: Four times a day (QID) | RESPIRATORY_TRACT | Status: DC | PRN

## 2022-03-01 MED ORDER — SPIRONOLACTONE 25 MG PO TABS: 12.5000 mg | ORAL_TABLET | Freq: Every day | ORAL | 0 refills | Status: DC

## 2022-03-01 NOTE — Progress Notes (Signed)
Mobility Specialist - Progress Note   03/01/22 1544  Mobility  Activity Ambulated with assistance in hallway  Level of Assistance Standby assist, set-up cues, supervision of patient - no hands on  Assistive Device Front wheel walker  Distance Ambulated (ft) 140 ft  Activity Response Tolerated well  Mobility Referral Yes  $Mobility charge 1 Mobility   Pt was received in bed and agreeable to mobility. No complaints throughout ambulation. Pt was returned to bed with all needs met.  Franki Monte  Mobility Specialist Please contact via Solicitor or Rehab office at [phone]

## 2022-03-01 NOTE — Discharge Summary (Addendum)
Physician Discharge Summary   Patient: Danielle [name] MRN: [id number] DOB: [birth year]  Admit date:     02/26/2022  Discharge date: 03/01/22  Discharge Physician: Jimmy Picket Jalani Rominger   PCP: [name], MD   Recommendations at discharge:    Patient will continue amiodarone 200 mg bid to complete 7 days and then reduce to 200 mg daily Continue anticoagulation with apixaban. Continue diuresis with furosemide and added spironolactone. Follow up with Dr Bea Graff in 7 to 10 days. Follow up with Cardiology as scheduled. Follow up renal function and electrolytes in 7 days.  Follow up report of thyroid function testing free t3/ t4   Discharge Diagnoses: Principal Problem:   Paroxysmal atrial fibrillation (HCC) Active Problems:   Acute on chronic diastolic CHF (congestive heart failure) (HCC)   Stage 3a chronic kidney disease (HCC)   Coronary artery disease involving native coronary artery of native heart without angina pectoris   COPD (chronic obstructive pulmonary disease) (HCC)   Type 2 diabetes mellitus with hyperlipidemia (HCC)   Iron deficiency anemia   Hypertension   Class 3 obesity ([place])  Resolved Problems:   * No resolved hospital problems. [facility] Course: Danielle [name] was admitted to the hospital with the working diagnosis of chest pain in the setting of tachyarrhythmia.   88 yo female with the past medical history of COPD, chronic hypoxemic respiratory failure, coronary artery disease, T2Dm, CKD, obesity and dyslipidemia who presented with chest pain. Precordial chest pain, 10/10 radiated to the jaw, pressure in nature. She called EMS, she was found tachycardic, received metoprolol and was transported to the ED. On her initial physical examination her blood pressure was 109/74, HR 94, RR 22 and 02 saturation 99%, lungs with no wheezing or rales, heart with S1 and S2 present and rhythmic, abdomen with no distention, no lower extremity edema.   Na 137, K 3,6  Cl 98 bicarbonate 24, glucose 249, bun 20 cr 0,82  High sensitive troponin 30 and 43  Wbc 10,5 hgb 12,6 plt 241 Sars covid 19 negative  Urine analysis SG 1,008, negative protein, 21-50 wbc   Chest radiograph with no infiltrates, or effusions  CT chest with faint bilateral ground glass opacities, negative for pulmonary embolism.   EKG 131 bpm, left axis deviation, normal qtc, SVT rhythm with no significant ST segment or T wave changes.  EKG 92 bpm, left axis, normal qtc, atrial fibrillation rhythm with no significant ST segment or T wave changes.   Patient placed on heparin drip and amiodarone for rate control.   11/30 patient converted to sinus rhythm.  12/01 cardiac CT for ischemic work up was negative 12/02 patient continue on sinus rhythm, plan to continue amiodarone and apixaban, follow up with cardiology as outpatient.   Assessment and Plan: * Paroxysmal atrial fibrillation (HCC) Atrial flutter.  Patient was placed on amiodarone converting to sinus rhythm.  Patient has been sinus bradycardia 50 to 60 bpm.   Placed on apixaban for anticoagulation. Continue metoprolol at 25 mg daily and follow up with cardiology as outpatient.  TSH with mild elevation, but patient has no hyperthyroid symptoms, and she has other more strong risk factors for atrial fibrillation, including COPD, advance age and obesity.   Acute on chronic diastolic CHF (congestive heart failure) (HCC) Echocardiogram with preserved LV systolic function with EF 60 to 68%, RV systolic function preserved, RVSP 31.3, no significant valvular disease.  Patient with improvement in volume status, she received IV furosemide for diuresis.  Continue diuresis with furosemide IV, negative fluid balance was achieved with significant improvement in her symptoms. Plan to continue diuresis with furosemide and added spironolactone. Continue metoprolol at 25 mg daily.   Stage 3a chronic kidney disease (HCC) Hyponatremia,  hypokalemia.   Patient with improvement in volume status, at the time of her discharge her serum cr is 1.0, K is 3,4 and serum bicarbonate at 29. Plan to continue diuresis with oral furosemide and added spironolactone.  Follow up renal function as outpatient.   Coronary artery disease involving native coronary artery of native heart without angina pectoris No further chest pain Cardiac CT with no obstructive coronary artery disease.  Patient ruled out for acute coronary syndrome.   COPD (chronic obstructive pulmonary disease) (HCC) Chronic hypoxemic respiratory failure. No clinical signs of exacerbation. Continue supplemental 02 per [place].   Patient will need outpatient swallow evaluation, per speech therapy recommendations.   Type 2 diabetes mellitus with hyperlipidemia (HCC) Uncontrolled diabetes mellitus with hyperglycemia.   Patient was placed on insulin therapy for glucose control, at the time of her discharge she will resume oral hypoglycemic agents.   Continue with statin therapy.   Iron deficiency anemia Cell count has been stable   Hypertension Continue blood pressure control with metoprolol.   Class 3 obesity (HCC) Calculated BMI is 43,5          Consultants: cardiology  Procedures performed: none   Disposition: Home Diet recommendation:  Cardiac and Carb modified diet DISCHARGE MEDICATION: Allergies as of 03/01/2022       Reactions   Codeine Swelling   Levofloxacin    Other reaction(s): Malaise (intolerance)   Penicillins Swelling   Has patient had a PCN reaction causing immediate rash, facial/tongue/throat swelling, SOB or lightheadedness with hypotension:YES Has patient had a PCN reaction causing severe rash involving mucus membranes or skin necrosis: NO Has patient had a PCN reaction that required hospitalization NO Has patient had a PCN reaction occurring within the last 10 years: NO If all of the above answers are "NO", then may proceed with  Cephalosporin use.   Tape Rash        Medication List     STOP taking these medications    aspirin 81 MG tablet       TAKE these medications    albuterol 108 (90 Base) MCG/ACT inhaler Commonly known as: VENTOLIN HFA Inhale 2 puffs into the lungs every 6 (six) hours as needed for wheezing or shortness of breath.   amiodarone 200 MG tablet Commonly known as: PACERONE Take twice daily for 5 days until 03/06/23, then continue with once daily, starting on 03/06/22.   apixaban 5 MG Tabs tablet Commonly known as: ELIQUIS Take 1 tablet (5 mg total) by mouth 2 (two) times daily.   budesonide-formoterol 160-4.5 MCG/ACT inhaler Commonly known as: SYMBICORT Inhale 2 puffs into the lungs 2 (two) times daily.   citalopram 20 MG tablet Commonly known as: CELEXA Take 20 mg by mouth daily.   clonazePAM 1 MG tablet Commonly known as: KLONOPIN Take 1 mg by mouth as needed for anxiety (sleep).   diclofenac Sodium 1 % Gel Commonly known as: VOLTAREN APPLY 2 GRAMS TOPICALLY TO AFFECTED AREA THREE TIMES DAILY What changed: See the new instructions.   furosemide 20 MG tablet Commonly known as: LASIX Take 3 tablets (60 mg total) by mouth daily.   glimepiride 4 MG tablet Commonly known as: AMARYL Take 4 mg by mouth daily.   Januvia 25 MG tablet Generic  drug: sitaGLIPtin Take 100 mg by mouth daily.   metFORMIN 500 MG tablet Commonly known as: GLUCOPHAGE Take 500 mg by mouth 2 (two) times daily.   metoprolol succinate 25 MG 24 hr tablet Commonly known as: TOPROL-XL Take 25 mg by mouth daily.   nitroGLYCERIN 0.4 MG SL tablet Commonly known as: Nitrostat Place 1 tablet (0.4 mg total) under the tongue every 5 (five) minutes as needed for chest pain.   pantoprazole 40 MG tablet Commonly known as: PROTONIX Take 40 mg by mouth 2 (two) times daily.   potassium chloride 10 MEQ CR capsule Commonly known as: MICRO-K Take 10 mEq by mouth daily.   ranolazine 500 MG 12 hr  tablet Commonly known as: RANEXA Take 1 tablet (500 mg total) by mouth in the morning.   rOPINIRole 0.25 MG tablet Commonly known as: REQUIP Take 0.25 mg by mouth at bedtime.   rosuvastatin 40 MG tablet Commonly known as: CRESTOR Take 40 mg by mouth daily.   spironolactone 25 MG tablet Commonly known as: ALDACTONE Take 0.5 tablets (12.5 mg total) by mouth daily. Start taking on: March 02, 2022   Vitamin D (Ergocalciferol) 1.25 MG (50000 UNIT) Caps capsule Commonly known as: DRISDOL Take 50,000 Units by mouth every Sunday. No specific days        Follow-up Information     Loel Dubonnet, NP Follow up.   Specialty: Cardiology Why: We have scheduled a sooner post-hospital appointment at the [facility] location located off [facility] on Thursday Mar 13, 2022 2:20 PM (Arrive by 2:05 PM). You will see Laurann Montana, one of the NPs that works with Dr. Agustin Cree since we did not have any availability in the [place] location during this timeframe. We will still keep your appointment scheduled in January with Dr. Agustin Cree in [place]. Contact information: [place] [address] [address]. Follow up.   Why: Physical and Occupational Therapy-office to call with visit times. Contact information: [place] [address] [phone]                Discharge Exam: Danley Danker Weights   02/26/22 1828 03/01/22 0318  Weight: 111.5 kg 110.1 kg   BP 139/61 (BP Location: Right Arm)   Pulse (!) 55   Temp 98 F (36.7 C) (Oral)   Resp 18   Ht '5\' 3"'$  (1.6 m)   Wt 110.1 kg   SpO2 97%   BMI 43.01 kg/m   Patient with no chest pain or dyspnea  Neurology awake and alert ENT with mild pallor Cardiovascular with S1 and S2 present and rhythmic with no gallops, rubs or murmurs No JVD Non pitting lower extremity edema Respiratory with no rales or  wheezing Abdomen with no distention   Condition at discharge: stable  The results of significant diagnostics from this hospitalization (including imaging, microbiology, ancillary and laboratory) are listed below for reference.   Imaging Studies: CT CORONARY FFR DATA PREP & FLUID ANALYSIS  Result Date: 02/28/2022 EXAM: FFRCT ANALYSIS FINDINGS: FFRct analysis was performed on the original cardiac CT angiogram dataset. Diagrammatic representation of the FFRct analysis is provided in a separate PDF document in PACS. This dictation was created using the PDF document and an interactive 3D model of the results. 3D model is not available in the EMR/PACS. Normal FFR range is >0.80. 1. Left Main: No significant stenosis 2. LAD: No  significant stenosis 3. LCX: CTFFR 0.86 across lesion in OM1, suggesting lesion is not hemodynamically significant 4. RCA: CTFFR 0.91 across lesions in proximal/mid RCA, suggesting lesion is not hemodynamically significant IMPRESSION: 1.  CTFFR suggests nonobstructive CAD Electronically Signed   By: Oswaldo Milian M.D.   On: 02/28/2022 13:35   CT CORONARY MORPH W/CTA COR W/SCORE W/CA W/CM &/OR WO/CM  Addendum Date: 02/28/2022   ADDENDUM REPORT: 02/28/2022 12:56 CLINICAL DATA:  7F with chest pain EXAM: Cardiac/Coronary CTA TECHNIQUE: The patient was scanned on a [employer]. FINDINGS: A 100 kV prospective scan was triggered in the descending thoracic aorta at 111 HU's. Axial non-contrast 3 mm slices were carried out through the heart. The data set was analyzed on a dedicated work station and scored using the [employer] [place]. Gantry rotation speed was 250 msecs and collimation was .6 mm. No beta blockade and 0.8 mg of sl NTG was given. The 3D data set was reconstructed in 5% intervals of the 35-75% of the R-R cycle. Phases were analyzed on a dedicated work station using MPR, MIP and VRT modes. The patient received 100 cc of contrast. Coronary Arteries:  Normal coronary  origin.  Right dominance. RCA is a large dominant artery that gives rise to PDA and PLA. Calcified plaque in the proximal RCA causes 25-49% stenosis. Calcified plaque in the mid RCA causes 25-49% stenosis. Calcified plaque in the distal RCA causes 25-49% stenosis Left main is a large artery that gives rise to LAD and LCX arteries. LAD is a large vessel. Calcified plaque in the proximal LAD causes 25-49% stenosis LCX is a non-dominant artery that gives rise to one large OM1 branch. Mixed plaque in the proximal LCX causes 25-49% stenosis. High risk plaque features including napkin ring sign, low attenuation plaque, and positive remodeling. Calcified plaque in OM1 causes 25-49% stenosis Other findings: Left Ventricle: Normal size Left Atrium: Moderate enlargement Pulmonary Veins: Normal configuration Right Ventricle: Normal size Right Atrium: Normal size Thoracic aorta: Normal size Pulmonary Arteries: Normal size Systemic Veins: Normal drainage Pericardium: Normal thickness IMPRESSION: 1. Coronary calcium score of 1391. No percentile available given age greater than 64 2. Normal coronary origin with right dominance. 3. Poor quality study due to noise secondary to obesity, severe coronary calcifications, and patient had PVC during acquisition. However, no obstructive CAD seen 4. Mixed plaque in the proximal LCX causes mild (25-49%) stenosis. High risk plaque features including napkin ring sign, low attenuation plaque, and positive remodeling 5. Calcified plaque causes mild (25-49%) stenosis in proximal/mid/distal RCA, proximal LAD, and OM1 CAD-RADS 2. Mild non-obstructive CAD (25-49%). Consider non-atherosclerotic causes of chest pain. Consider preventive therapy and risk factor modification. Electronically Signed   By: Oswaldo Milian M.D.   On: 02/28/2022 12:56   Result Date: 02/28/2022 EXAM: OVER-READ INTERPRETATION  CT CHEST The following report is a limited chest CT over-read performed by radiologist Dr.  Zetta Bills of [facility], [place] on 02/28/2022. This over-read does not include interpretation of cardiac or coronary anatomy or pathology. The coronary calcium and coronary CT angiography interpretation by the cardiologist is attached. COMPARISON:  February 26, 2022 FINDINGS: Vascular: Please see dedicated report for cardiovascular details. There is aortic atherosclerosis and calcified coronary artery disease. Mediastinum/Nodes: No adenopathy or acute process in the mediastinum. Lungs/Pleura: Peri fissural nodule in the RIGHT mid chest is unchanged, this is found along the minor fissure and is triangular on coronal images, likely a small intrapulmonary lymph node for which no additional dedicated imaging follow-up  is advised. No pleural effusion. No consolidative changes. Mild LEFT juxta diaphragmatic atelectatic changes. Upper Abdomen: Incidental imaging of upper abdominal contents without definite acute findings. There are vague areas of peripheral low attenuation in the spleen but phase is quite early in terms of enhancement of the spleen. Some of these areas may be wedge-shaped however. Musculoskeletal: No acute bone finding. No destructive bone process. Spinal degenerative changes. IMPRESSION: 1. There are vague areas of peripheral low attenuation in the spleen but phase is quite early in terms of enhancement of the spleen. Given vaguely wedge-shaped appearance of some of these areas the possibility of splenic infarcts are considered though this may simply be related to bolus timing and phase of enhancement of the spleen. If there are LEFT upper quadrant abdominal symptoms could consider dedicated abdominal imaging as warranted. 2. Aortic atherosclerosis and calcified coronary artery disease. Aortic Atherosclerosis (ICD10-I70.0). Electronically Signed: By: Zetta Bills M.D. On: 02/28/2022 11:14   CT Angio Chest PE W and/or Wo Contrast  Result Date: 02/26/2022 CLINICAL DATA:  High probability of  pulmonary embolism EXAM: CT ANGIOGRAPHY CHEST WITH CONTRAST TECHNIQUE: Multidetector CT imaging of the chest was performed using the standard protocol during bolus administration of intravenous contrast. Multiplanar CT image reconstructions and MIPs were obtained to evaluate the vascular anatomy. RADIATION DOSE REDUCTION: This exam was performed according to the departmental dose-optimization program which includes automated exposure control, adjustment of the mA and/or kV according to patient size and/or use of iterative reconstruction technique. CONTRAST:  56m OMNIPAQUE IOHEXOL 350 MG/ML SOLN COMPARISON:  Radiographs earlier today CT chest angiogram 09/21/2021 FINDINGS: Cardiovascular: Satisfactory opacification of the pulmonary arteries to the segmental level. No evidence of pulmonary embolism. Mild cardiomegaly. No pericardial effusion. Coronary artery and aortic athero sclerotic calcification. Mediastinum/Nodes: No enlarged mediastinal, hilar, or axillary lymph nodes. Thyroid gland, trachea, and esophagus demonstrate no significant findings. Lungs/Pleura: Mosaic attenuation compatible with air trapping. Atelectasis or infiltrates in the lower lungs. Mild bronchial wall thickening in the lower lungs. Scattered mucous plugs in the lower lungs. Scattered pulmonary nodules measuring up to 6 mm in the anterior right upper lobe (series 6/62 and series 6/77). These appear triangular in shape on coronal images and likely represents focal atelectasis or intrapulmonary lymph nodes. Upper Abdomen: No acute abnormality. Stable left adrenal nodule compatible with adenoma. No follow-up recommended. Musculoskeletal: No chest wall abnormality. No acute osseous findings. Thoracic spondylosis with multilevel bridging. Review of the MIP images confirms the above findings. IMPRESSION: Negative for acute pulmonary embolism. Chronic bronchitis/reactive airways possibly secondary to aspiration. Coronary artery and Aortic  Atherosclerosis (ICD10-I70.0). Electronically Signed   By: TPlacido SouM.D.   On: 02/26/2022 21:20   DG Chest Portable 1 View  Result Date: 02/26/2022 CLINICAL DATA:  Shortness of breath EXAM: PORTABLE CHEST 1 VIEW COMPARISON:  12/23/2021 FINDINGS: Heart size within normal limits. Aortic atherosclerosis. Low lung volumes. Airspace opacity in the periphery of the right lower lobe. Mild left basilar atelectasis. No pleural effusion or pneumothorax. IMPRESSION: Airspace opacity in the periphery of the right lower lobe, suspicious for pneumonia. Electronically Signed   By: NDavina PokeD.O.   On: 02/26/2022 18:19   ECHOCARDIOGRAM COMPLETE  Result Date: 02/05/2022    ECHOCARDIOGRAM REPORT   Patient Name:   DJannetta QuintDate of Exam: 02/04/2022 Medical Rec #:  [id number]           Height:       63.0 in Accession #:    [id number]  Weight:       245.8 lb Date of Birth:  1933/04/19            BSA:          2.111 m Patient Age:    61 years             BP:           138/60 mmHg Patient Gender: F                    HR:           67 bpm. Exam Location:   Procedure: 2D Echo, Cardiac Doppler, Color Doppler and Strain Analysis Indications:    Dyspnea on exertion [R06.09 (ICD-10-CM)]  History:        Patient has prior history of Echocardiogram examinations, most                 recent 08/24/2019. COPD; Signs/Symptoms:Chest Pain and Dyspnea.  Sonographer:    Luane School RDCS Referring Phys: [phone] [place]  1. Left ventricular ejection fraction, by estimation, is 60 to 65%. The left ventricle has normal function. The left ventricle has no regional wall motion abnormalities. Left ventricular diastolic parameters are consistent with Grade I diastolic dysfunction (impaired relaxation).  2. Right ventricular systolic function is normal. The right ventricular size is normal. There is normal pulmonary artery systolic pressure.  3. The mitral valve is normal in structure. No  evidence of mitral valve regurgitation. No evidence of mitral stenosis.  4. The aortic valve is calcified. Aortic valve regurgitation is not visualized. Aortic valve sclerosis/calcification is present, without any evidence of aortic stenosis.  5. The inferior vena cava is normal in size with greater than 50% respiratory variability, suggesting right atrial pressure of 3 mmHg. FINDINGS  Left Ventricle: Left ventricular ejection fraction, by estimation, is 60 to 65%. The left ventricle has normal function. The left ventricle has no regional wall motion abnormalities. The left ventricular internal cavity size was normal in size. There is  no left ventricular hypertrophy. Left ventricular diastolic parameters are consistent with Grade I diastolic dysfunction (impaired relaxation). Right Ventricle: The right ventricular size is normal. No increase in right ventricular wall thickness. Right ventricular systolic function is normal. There is normal pulmonary artery systolic pressure. The tricuspid regurgitant velocity is 2.66 m/s, and  with an assumed right atrial pressure of 3 mmHg, the estimated right ventricular systolic pressure is 75.9 mmHg. Left Atrium: Left atrial size was normal in size. Right Atrium: Right atrial size was normal in size. Pericardium: There is no evidence of pericardial effusion. Mitral Valve: The mitral valve is normal in structure. No evidence of mitral valve regurgitation. No evidence of mitral valve stenosis. Tricuspid Valve: The tricuspid valve is normal in structure. Tricuspid valve regurgitation is mild . No evidence of tricuspid stenosis. Aortic Valve: The aortic valve is calcified. Aortic valve regurgitation is not visualized. Aortic valve sclerosis/calcification is present, without any evidence of aortic stenosis. Pulmonic Valve: The pulmonic valve was normal in structure. Pulmonic valve regurgitation is not visualized. No evidence of pulmonic stenosis. Aorta: The aortic root is normal in  size and structure. Venous: The inferior vena cava is normal in size with greater than 50% respiratory variability, suggesting right atrial pressure of 3 mmHg. IAS/Shunts: No atrial level shunt detected by color flow Doppler.  LEFT VENTRICLE PLAX 2D LVIDd:         5.10 cm   Diastology LVIDs:  3.50 cm   LV e' medial:    5.77 cm/s LV PW:         1.00 cm   LV E/e' medial:  15.5 LV IVS:        1.00 cm   LV e' lateral:   8.05 cm/s LVOT diam:     2.00 cm   LV E/e' lateral: 11.1 LV SV:         92 LV SV Index:   43 LVOT Area:     3.14 cm  RIGHT VENTRICLE             IVC RV S prime:     11.90 cm/s  IVC diam: 1.80 cm TAPSE (M-mode): 2.2 cm LEFT ATRIUM             Index        RIGHT ATRIUM           Index LA diam:        4.30 cm 2.04 cm/m   RA Area:     16.70 cm LA Vol (A2C):   70.4 ml 33.35 ml/m  RA Volume:   46.10 ml  21.84 ml/m LA Vol (A4C):   65.1 ml 30.84 ml/m LA Biplane Vol: 68.0 ml 32.22 ml/m  AORTIC VALVE LVOT Vmax:   125.00 cm/s LVOT Vmean:  86.000 cm/s LVOT VTI:    0.292 m  AORTA Ao Root diam: 3.20 cm Ao Asc diam:  3.20 cm Ao Desc diam: 2.30 cm MITRAL VALVE                TRICUSPID VALVE MV Area (PHT): 2.97 cm     TR Peak grad:   28.3 mmHg MV Decel Time: 255 msec     TR Vmax:        266.00 cm/s MV E velocity: 89.50 cm/s MV A velocity: 108.00 cm/s  SHUNTS MV E/A ratio:  0.83         Systemic VTI:  0.29 m                             Systemic Diam: 2.00 cm [name] MD Electronically signed by [name] MD Signature Date/Time: 02/05/2022/12:22:38 PM    Final     Microbiology: Results for orders placed or performed during the hospital encounter of 02/26/22  Resp Panel by RT-PCR (Flu A&B, Covid) Anterior Nasal Swab     Status: None   Collection Time: 02/26/22  6:56 PM   Specimen: Anterior Nasal Swab  Result Value Ref Range Status   SARS Coronavirus 2 by RT PCR NEGATIVE NEGATIVE Final    Comment: (NOTE) SARS-CoV-2 target nucleic acids are NOT DETECTED.  The SARS-CoV-2 RNA is  generally detectable in upper respiratory specimens during the acute phase of infection. The lowest concentration of SARS-CoV-2 viral copies this assay can detect is 138 copies/mL. A negative result does not preclude SARS-Cov-2 infection and should not be used as the sole basis for treatment or other patient management decisions. A negative result may occur with  improper specimen collection/handling, submission of specimen other than nasopharyngeal swab, presence of viral mutation(s) within the areas targeted by this assay, and inadequate number of viral copies(<138 copies/mL). A negative result must be combined with clinical observations, patient history, and epidemiological information. The expected result is Negative.  Fact Sheet for Patients:  EntrepreneurPulse.com.au  Fact Sheet for Healthcare Providers:  IncredibleEmployment.be  This test is no t yet approved  or cleared by the [place] and  has been authorized for detection and/or diagnosis of SARS-CoV-2 by FDA under an Emergency Use Authorization (EUA). This EUA will remain  in effect (meaning this test can be used) for the duration of the COVID-19 declaration under Section 564(b)(1) of the Act, 21 U.S.C.section 360bbb-3(b)(1), unless the authorization is terminated  or revoked sooner.       Influenza A by PCR NEGATIVE NEGATIVE Final   Influenza B by PCR NEGATIVE NEGATIVE Final    Comment: (NOTE) The Xpert Xpress SARS-CoV-2/FLU/RSV plus assay is intended as an aid in the diagnosis of influenza from Nasopharyngeal swab specimens and should not be used as a sole basis for treatment. Nasal washings and aspirates are unacceptable for Xpert Xpress SARS-CoV-2/FLU/RSV testing.  Fact Sheet for Patients: EntrepreneurPulse.com.au  Fact Sheet for Healthcare Providers: IncredibleEmployment.be  This test is not yet approved or cleared by the [place] FDA and has been authorized for detection and/or diagnosis of SARS-CoV-2 by FDA under an Emergency Use Authorization (EUA). This EUA will remain in effect (meaning this test can be used) for the duration of the COVID-19 declaration under Section 564(b)(1) of the Act, 21 U.S.C. section 360bbb-3(b)(1), unless the authorization is terminated or revoked.  Performed at [facility], [place] [address] [place], [place] [address]     Labs: CBC: Recent Labs  Lab 02/26/22 1810 02/28/22 0247  WBC 10.5 10.0  NEUTROABS 7.5  --   HGB 12.6 11.4*  HCT 39.3 35.2*  MCV 87.7 85.2  PLT 241 856   Basic Metabolic Panel: Recent Labs  Lab 02/26/22 1810 02/27/22 0100 02/28/22 0247 03/01/22 0135  NA 137 134* 138 138  K 3.6 4.4 3.5 3.4*  CL 98 99 98 99  CO2 '24 24 30 29  '$ GLUCOSE 249* 391* 271* 217*  BUN '20 20 23 '$ 29*  CREATININE 0.82 0.88 0.91 1.08*  CALCIUM 8.9 8.7* 9.1 8.8*  MG  --   --  2.2  --    Liver Function Tests: Recent Labs  Lab 02/26/22 1810 02/27/22 0100  AST 27 25  ALT 19 20  ALKPHOS 56 55  BILITOT 0.3 0.1*  PROT 6.2* 5.7*  ALBUMIN 3.8 3.4*   CBG: Recent Labs  Lab 02/27/22 1650 02/27/22 1836 02/27/22 2045 02/28/22 0809 02/28/22 1211  GLUCAP 324* 369* 358* 227* 298*    Discharge time spent: greater than 30 minutes.  Signed: Tawni Millers, MD [facility] 03/01/2022

## 2022-03-01 NOTE — Progress Notes (Signed)
    Hospitalist paged regarding question on patient's low TSH and use of amiodarone today. Patient was seen by Dr Acie Fredrickson yesterday, deemed stable for home discharge given negative coronary disease on CT and she was removed from our rounding list.  TSH 0.262, added FT4 and FT3 today as they are not checked so far. Patient has no clinical signs of hyperthyroidism at this time per hospitalist reports.   She was discovered with paroxysmal atrial flutter from this admission and started on amiodarone for rate control. Advised hospitalist team to continue amiodarone taper at this time with '200mg'$  BID for 7 days and 200 mg daily after. She has an appt with cardiology APP on 03/13/22, we will follow on the thyroid panel and determine if amiodarone can be continued versus alternative agents should be used. Probably short course amiodarone given she also has COPD.  She should have PCP appt arranged in a week to review thyroid panel and determine if she has hyperthyroidism.

## 2022-03-02 LAB — T3, FREE: T3, Free: 2.2 pg/mL (ref 2.0–4.4)

## 2022-03-03 LAB — GLUCOSE, CAPILLARY
Glucose-Capillary: 305 mg/dL — ABNORMAL HIGH (ref 70–99)
Glucose-Capillary: 322 mg/dL — ABNORMAL HIGH (ref 70–99)
Glucose-Capillary: 207 mg/dL — ABNORMAL HIGH (ref 70–99)
Glucose-Capillary: 280 mg/dL — ABNORMAL HIGH (ref 70–99)
Glucose-Capillary: 304 mg/dL — ABNORMAL HIGH (ref 70–99)
Glucose-Capillary: 338 mg/dL — ABNORMAL HIGH (ref 70–99)

## 2022-03-06 ENCOUNTER — Ambulatory Visit: Payer: PPO | Admitting: Podiatry

## 2022-03-06 VITALS — BP 138/52

## 2022-03-06 DIAGNOSIS — M2142 Flat foot [pes planus] (acquired), left foot: Secondary | ICD-10-CM

## 2022-03-06 DIAGNOSIS — B351 Tinea unguium: Secondary | ICD-10-CM | POA: Diagnosis not present

## 2022-03-06 DIAGNOSIS — I739 Peripheral vascular disease, unspecified: Secondary | ICD-10-CM | POA: Diagnosis not present

## 2022-03-06 DIAGNOSIS — M79674 Pain in right toe(s): Secondary | ICD-10-CM | POA: Diagnosis not present

## 2022-03-06 DIAGNOSIS — L84 Corns and callosities: Secondary | ICD-10-CM | POA: Diagnosis not present

## 2022-03-06 DIAGNOSIS — M2141 Flat foot [pes planus] (acquired), right foot: Secondary | ICD-10-CM | POA: Diagnosis not present

## 2022-03-06 DIAGNOSIS — M79675 Pain in left toe(s): Secondary | ICD-10-CM | POA: Diagnosis not present

## 2022-03-06 DIAGNOSIS — E119 Type 2 diabetes mellitus without complications: Secondary | ICD-10-CM | POA: Diagnosis not present

## 2022-03-06 DIAGNOSIS — E1140 Type 2 diabetes mellitus with diabetic neuropathy, unspecified: Secondary | ICD-10-CM

## 2022-03-06 NOTE — Progress Notes (Signed)
ANNUAL DIABETIC FOOT EXAM  Subjective: Danielle [name] presents today for annual diabetic foot examination. She is accompanied by her daughter on today's visit. Patient feels a little weak as she was recently discharged from the hospital.  Chief Complaint  Patient presents with   Nail Problem    [facility] BS-223 Last night A1C-10.? Lynelle Doctor PCP VST-11/2021   Patient confirms h/o diabetes.  Patient denies any h/o foot wounds.  Patient admits symptoms of foot numbness.  Risk factors: diabetes, neuropathy, HTN, CAD, CHF, CKD, hyperlipidemia, h/o tobacco use in remission, foot deformity.  [name], MD is patient's PCP.  Past Medical History:  Diagnosis Date   Anginal pain ([place])    Arthritis    Asthma    At high risk for falls 11/11/2017   Chest pain 11/09/2015   CHF (congestive heart failure) (HCC)    Chronic diastolic congestive heart failure ([place]) 11/11/2015   Chronic insomnia 10/24/2015   Chronic pain of both knees 05/14/2018   Chronic respiratory failure with hypoxia ([place]) 05/05/2017   CKD (chronic kidney disease) stage 3, GFR 30-59 ml/min (HCC) 05/07/2017   COPD (chronic obstructive pulmonary disease) ([place])    Coronary artery disease    Coronary artery disease involving native coronary artery of native heart without angina pectoris 11/14/2014   Overview:  40% RCA a cardiac catheter in 2014   Current mild episode of major depressive disorder ([place]) 11/14/2014   Depression    DOE (dyspnea on exertion) 09/07/2018   Dysphagia 08/12/2018   Edema 08/12/2018   Elevated blood sugar 11/09/2015   Emphysema lung (HCC)    Flat feet, bilateral 05/30/2020   Gastroesophageal reflux disease without esophagitis 11/11/2017   GERD (gastroesophageal reflux disease)    Headache    History of hiatal hernia    Hyperlipidemia    Hypertension    Insomnia    Internal hemorrhoids 11/26/2018   Iron deficiency anemia 08/12/2018   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) 08/12/2018   Shortness of breath dyspnea    with exertion   Stasis edema of left lower extremity 04/13/2018   Strain of right shoulder 08/12/2018   Type 2 diabetes mellitus with stage 3 chronic kidney disease ([place]) 08/14/2015   Unstable angina ([place]) 10/30/2018   Vitamin B12 deficiency 04/13/2018   Patient Active Problem List   Diagnosis Date Noted   Subclinical hypothyroidism 03/11/2022   Atrial fibrillation ([place]) 02/28/2022   Paroxysmal atrial fibrillation ([place]) 02/27/2022   Acute on chronic diastolic CHF (congestive heart failure) ([place]) 02/27/2022   Multifocal atrial tachycardia 02/26/2022   Coronary artery disease    PONV (postoperative nausea and vomiting)    Osteoporosis    Insomnia    Hypertension    Hyperlipidemia    History of hiatal hernia    Headache    GERD (gastroesophageal reflux disease)    Emphysema lung (HCC)    COPD (chronic obstructive pulmonary disease) (HCC)    CHF (congestive heart failure) (HCC)    Asthma    Arthritis    Anginal pain ([place])    Callus 05/30/2020   Flat feet, bilateral 05/30/2020  Hammer toe of left foot 05/30/2020   OSA (obstructive sleep apnea) 12/16/2019   Internal hemorrhoids 11/26/2018   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/26/2018   Unstable angina (HCC) 10/30/2018   Positive D-dimer 09/08/2018   DOE (dyspnea on exertion) 09/07/2018   Class 3 obesity ([place]) 09/07/2018   Edema 08/12/2018   Iron deficiency anemia 08/12/2018   Senile purpura ([place]) 08/12/2018   Strain of right shoulder 08/12/2018   Dysphagia 08/12/2018   Chronic pain of both knees 05/14/2018   Stasis edema of left lower extremity 04/13/2018    Vitamin B12 deficiency 04/13/2018   Restless legs syndrome 03/04/2018   At high risk for falls 11/11/2017   Gastroesophageal reflux disease without esophagitis 11/11/2017   Stage 3a chronic kidney disease ([place]) 05/07/2017   Chronic respiratory failure with hypoxia ([place]) 05/05/2017   Midsternal chest pain 11/11/2015   Chronic diastolic congestive heart failure ([place]) 11/11/2015   Chest pain 11/09/2015   Depression 11/09/2015   Panlobular emphysema ([place]) 11/09/2015   Elevated blood sugar 11/09/2015   Chronic insomnia 10/24/2015   Type 2 diabetes mellitus with hyperlipidemia ([place]) 08/14/2015   Mesenteric mass 02/19/2015   Coronary artery disease involving native coronary artery of native heart without angina pectoris 11/14/2014   Mixed hyperlipidemia 11/14/2014   Current mild episode of major depressive disorder ([place]) 11/14/2014   Pneumonia 2013   Past Surgical History:  Procedure Laterality Date   ABDOMINAL HYSTERECTOMY     APPENDECTOMY     BREAST SURGERY  40 yrs. ago   growth removed in each breast-benign   CARDIAC CATHETERIZATION     12/14/12 LHC ([facility]): few mild stenotic areas max 40% of ectatic RCA o/w NL coronaries, EF 65%. Med tx.   CATARACT EXTRACTION W/ INTRAOCULAR LENS  IMPLANT, BILATERAL Bilateral 15 yrs ago   CHOLECYSTECTOMY     COLONOSCOPY  04/22/2013   Diverticulosis in the sigmoid colon. Non bleeding internal hemorrhoids. Normal mucosa vascular pattern in the entire examined colon. Three 6 mm polyps n the descending colon. Resected and retrieved.    FOOT SURGERY     LAPAROSCOPIC APPENDECTOMY N/A 02/19/2015   Procedure: EXCISION OF MESENTERIC MASS;  Surgeon: Stark Klein, MD;  Location: [place];  Service: General;  Laterality: N/A;   REPLACEMENT TOTAL KNEE BILATERAL     Current Outpatient Medications on File Prior to Visit  Medication Sig Dispense Refill   albuterol (PROVENTIL HFA;VENTOLIN HFA) 108 (90 BASE) MCG/ACT inhaler Inhale 2 puffs into the lungs every 6 (six)  hours as needed for wheezing or shortness of breath.      amiodarone (PACERONE) 200 MG tablet Take twice daily for 5 days until 03/06/23, then continue with once daily, starting on 03/06/22. 30 tablet 0   apixaban (ELIQUIS) 5 MG TABS tablet Take 1 tablet (5 mg total) by mouth 2 (two) times daily. 60 tablet 0   budesonide-formoterol (SYMBICORT) 160-4.5 MCG/ACT inhaler Inhale 2 puffs into the lungs 2 (two) times daily.     citalopram (CELEXA) 20 MG tablet Take 20 mg by mouth daily.     clonazePAM (KLONOPIN) 1 MG tablet Take 1 mg by mouth as needed for anxiety (sleep).     diclofenac Sodium (VOLTAREN) 1 % GEL APPLY 2 GRAMS TOPICALLY TO AFFECTED AREA THREE TIMES DAILY (Patient taking differently: Apply 2 g topically daily as needed (joint pain).) 100 g 0   furosemide (LASIX) 20 MG tablet Take 3 tablets (60 mg total) by mouth daily. 90 tablet 1   glimepiride (AMARYL) 4 MG tablet  Take 4 mg by mouth daily.     JANUVIA 25 MG tablet Take 100 mg by mouth daily.     metFORMIN (GLUCOPHAGE) 500 MG tablet Take 500 mg by mouth 2 (two) times daily.     metoprolol succinate (TOPROL-XL) 25 MG 24 hr tablet Take 25 mg by mouth daily.     nitroGLYCERIN (NITROSTAT) 0.4 MG SL tablet Place 1 tablet (0.4 mg total) under the tongue every 5 (five) minutes as needed for chest pain. 25 tablet 11   pantoprazole (PROTONIX) 40 MG tablet Take 40 mg by mouth 2 (two) times daily.     potassium chloride (MICRO-K) 10 MEQ CR capsule Take 10 mEq by mouth daily.     ranolazine (RANEXA) 500 MG 12 hr tablet Take 1 tablet (500 mg total) by mouth in the morning. 30 tablet 2   rOPINIRole (REQUIP) 0.25 MG tablet Take 0.25 mg by mouth at bedtime.     rosuvastatin (CRESTOR) 40 MG tablet Take 40 mg by mouth daily.     spironolactone (ALDACTONE) 25 MG tablet Take 0.5 tablets (12.5 mg total) by mouth daily. 15 tablet 0   Vitamin D, Ergocalciferol, (DRISDOL) 50000 UNITS CAPS capsule Take 50,000 Units by mouth every Sunday. No specific days      Current Facility-Administered Medications on File Prior to Visit  Medication Dose Route Frequency Provider Last Rate Last Admin   clindamycin (CLEOCIN) 900 mg in dextrose 5 % 50 mL IVPB  900 mg Intravenous 60 min Pre-Op Stark Klein, MD       And   gentamicin (GARAMYCIN) 5 mg/kg in dextrose 5 % 50 mL IVPB  5 mg/kg Intravenous 60 min Pre-Op Stark Klein, MD        Allergies  Allergen Reactions   Codeine Swelling   Levofloxacin     Other reaction(s): Malaise (intolerance)   Penicillins Swelling    Has patient had a PCN reaction causing immediate rash, facial/tongue/throat swelling, SOB or lightheadedness with hypotension:YES Has patient had a PCN reaction causing severe rash involving mucus membranes or skin necrosis: NO Has patient had a PCN reaction that required hospitalization NO Has patient had a PCN reaction occurring within the last 10 years: NO If all of the above answers are "NO", then may proceed with Cephalosporin use.   Tape Rash   Social History   Occupational History   Not on file  Tobacco Use   Smoking status: Former    Packs/day: 0.25    Types: Cigarettes    Quit date: 08/21/2016    Years since quitting: 5.5   Smokeless tobacco: Never  Vaping Use   Vaping Use: Never used  Substance and Sexual Activity   Alcohol use: No   Drug use: No   Sexual activity: Not on file   Family History  Problem Relation Age of Onset   Heart attack Mother    Immunization History  Administered Date(s) Administered   Influenza,inj,Quad PF,6+ Mos 02/22/2015     Review of Systems: Negative except as noted in the HPI.   Objective: Vitals:   03/06/22 1357 03/06/22 1414  BP: (!) 160/76 (!) 138/52   BLONNIE MASKE is a pleasant 88 y.o. female in NAD. AAO X 3.  Vascular Examination: CFT <3 seconds b/l. DP pulses faintly palpable b/l. PT pulses diminished b/l. Digital hair absent. Skin temperature gradient warm to warm b/l. No pain with calf compression. No ischemia or  gangrene. No cyanosis or clubbing noted b/l. Trace edema noted BLE.  Neurological Examination: Sensation grossly intact b/l with 10 gram monofilament. Vibratory sensation intact b/l. Pt has subjective symptoms of neuropathy. Protective sensation intact 5/5 intact bilaterally with 10g monofilament b/l. Vibratory sensation decreased b/l.  Dermatological Examination: Pedal skin warm and supple b/l. Toenails 1-5 b/l thick, discolored, elongated with subungual debris and pain on dorsal palpation.  No open wounds b/l LE. No interdigital macerations noted b/l LE. Hyperkeratotic lesion(s) L 5th toe, right great toe, and 1st metatarsal head right lower extremity.  No erythema, no edema, no drainage, no fluctuance.  Musculoskeletal Examination: Muscle strength 4/5 to all lower extremity muscle groups bilaterally. Pes planus deformity noted bilateral LE.  Radiographs: None  Last HgA1c:      Latest Ref Rng & Units 02/27/2022    1:00 AM  Hemoglobin A1C  Hemoglobin-A1c 4.8 - 5.6 % 10.2     Footwear Assessment: Does the patient wear appropriate shoes? Yes. Does the patient need inserts/orthotics? Yes.  ADA Risk Categorization:  High Risk  Patient has one or more of the following: Loss of protective sensation Absent pedal pulses Severe Foot deformity History of foot ulcer  Assessment: 1. Pain due to onychomycosis of toenails of both feet   2. Corns and callosities   3. Pes planus of both feet   4. PVD (peripheral vascular disease) ([place])   5. Type 2 diabetes mellitus with diabetic neuropathy, unspecified whether long term insulin use ([place])   6. Encounter for diabetic foot exam ([place])      Plan: No orders of the defined types were placed in this encounter.  -Patient's family member present. All questions/concerns addressed on today's visit. -Diabetic foot examination performed today. -Continue diabetic foot care principles: inspect feet daily, monitor glucose as recommended by PCP  and/or Endocrinologist, and follow prescribed diet per PCP, Endocrinologist and/or dietician. -Patient to continue soft, supportive shoe gear daily. -Toenails 1-5 b/l were debrided in length and girth with sterile nail nippers and dremel without iatrogenic bleeding.  -Corn(s) L 5th toe and callus(es) right great toe and 1st metatarsal head right foot were pared utilizing sterile scalpel blade without incident. Total number debrided =3. -Patient/POA to call should there be question/concern in the interim. Return in about 3 months (around 06/05/2022).  Marzetta Board, DPM

## 2022-03-11 DIAGNOSIS — E038 Other specified hypothyroidism: Secondary | ICD-10-CM | POA: Insufficient documentation

## 2022-03-12 ENCOUNTER — Encounter: Payer: Self-pay | Admitting: Podiatry

## 2022-03-13 ENCOUNTER — Other Ambulatory Visit (HOSPITAL_BASED_OUTPATIENT_CLINIC_OR_DEPARTMENT_OTHER): Payer: Self-pay

## 2022-03-13 ENCOUNTER — Ambulatory Visit (INDEPENDENT_AMBULATORY_CARE_PROVIDER_SITE_OTHER): Payer: PPO | Admitting: Family

## 2022-03-13 ENCOUNTER — Encounter (HOSPITAL_BASED_OUTPATIENT_CLINIC_OR_DEPARTMENT_OTHER): Payer: Self-pay | Admitting: Family

## 2022-03-13 VITALS — BP 110/62 | HR 59 | Ht 63.0 in | Wt 245.6 lb

## 2022-03-13 DIAGNOSIS — I2510 Atherosclerotic heart disease of native coronary artery without angina pectoris: Secondary | ICD-10-CM

## 2022-03-13 DIAGNOSIS — J431 Panlobular emphysema: Secondary | ICD-10-CM

## 2022-03-13 DIAGNOSIS — E785 Hyperlipidemia, unspecified: Secondary | ICD-10-CM

## 2022-03-13 DIAGNOSIS — I5032 Chronic diastolic (congestive) heart failure: Secondary | ICD-10-CM | POA: Diagnosis not present

## 2022-03-13 DIAGNOSIS — I480 Paroxysmal atrial fibrillation: Secondary | ICD-10-CM

## 2022-03-13 MED ORDER — SPIRONOLACTONE 25 MG PO TABS: 12.5000 mg | ORAL_TABLET | Freq: Every day | ORAL | 0 refills | Status: DC

## 2022-03-13 MED ORDER — METOPROLOL SUCCINATE ER 25 MG PO TB24: 25.0000 mg | ORAL_TABLET | Freq: Every day | ORAL | 3 refills | Status: DC

## 2022-03-13 MED ORDER — INFLUENZA VAC A&B SA ADJ QUAD 0.5 ML IM PRSY
PREFILLED_SYRINGE | INTRAMUSCULAR | 0 refills | Status: DC
  Filled 2022-03-13: qty 0.5, 1d supply, fill #0

## 2022-03-13 MED ORDER — APIXABAN 5 MG PO TABS: 5.0000 mg | ORAL_TABLET | Freq: Two times a day (BID) | ORAL | 11 refills | Status: DC

## 2022-03-13 MED ORDER — AMIODARONE HCL 200 MG PO TABS: 200.0000 mg | ORAL_TABLET | Freq: Every day | ORAL | 5 refills | Status: DC

## 2022-03-13 MED ORDER — FUROSEMIDE 20 MG PO TABS: 60.0000 mg | ORAL_TABLET | Freq: Every day | ORAL | 1 refills | Status: DC

## 2022-03-13 NOTE — Patient Instructions (Signed)
Medication Instructions:  Your Physician recommend you continue on your current medication as directed.     We sent refills today   If you have a resting heart rate of more than 100 for 15 minutes you may take an extra tablet of Metoprolol.   *If you need a refill on your cardiac medications before your next appointment, please call your pharmacy*   Lab Work: Your physician recommends that you return for lab work today-BMP  If you have labs (blood work) drawn today and your tests are completely normal, you will receive your results only by: MyChart Message (if you have MyChart) OR A paper copy in the mail If you have any lab test that is abnormal or we need to change your treatment, we will call you to review the results.  Follow-Up: At [facility], you and your health needs are our priority.  As part of our continuing mission to provide you with exceptional heart care, we have created designated Provider Care Teams.  These Care Teams include your primary Cardiologist (physician) and Advanced Practice Providers (APPs -  Physician Assistants and Nurse Practitioners) who all work together to provide you with the care you need, when you need it.  We recommend signing up for the patient portal called "MyChart".  Sign up information is provided on this After Visit Summary.  MyChart is used to connect with patients for Virtual Visits (Telemedicine).  Patients are able to view lab/test results, encounter notes, upcoming appointments, etc.  Non-urgent messages can be sent to your provider as well.   To learn more about what you can do with MyChart, go to [email or web address].    Your next appointment:   Follow up as scheduled

## 2022-03-13 NOTE — Progress Notes (Signed)
Office Visit    Patient Name: Danielle [name] Date of Encounter: 03/13/2022  PCP:  Danielle [name]., MD   [facility]  Cardiologist:  Danielle Campus, MD  Advanced Practice Provider:  No care team member to display Electrophysiologist:  None      Chief Complaint    Danielle [name] is a 88 y.o. female presents today for hospital follow up   Past Medical History    Past Medical History:  Diagnosis Date   Anginal pain (Danielle [name])    Arthritis    Asthma    At high risk for falls 11/11/2017   Chest pain 11/09/2015   CHF (congestive heart failure) ([place])    Chronic diastolic congestive heart failure ([place]) 11/11/2015   Chronic insomnia 10/24/2015   Chronic pain of both knees 05/14/2018   Chronic respiratory failure with hypoxia ([place]) 05/05/2017   CKD (chronic kidney disease) stage 3, GFR 30-59 ml/min ([place]) 05/07/2017   COPD (chronic obstructive pulmonary disease) ([place])    Coronary artery disease    Coronary artery disease involving native coronary artery of native heart without angina pectoris 11/14/2014   Overview:  40% RCA a cardiac catheter in 2014   Current mild episode of major depressive disorder ([place]) 11/14/2014   Depression    DOE (dyspnea on exertion) 09/07/2018   Dysphagia 08/12/2018   Edema 08/12/2018   Elevated blood sugar 11/09/2015   Emphysema lung (HCC)    Flat feet, bilateral 05/30/2020   Gastroesophageal reflux disease without esophagitis 11/11/2017   GERD (gastroesophageal reflux disease)    Headache    History of hiatal hernia    Hyperlipidemia    Hypertension    Insomnia    Internal hemorrhoids 11/26/2018   Iron deficiency anemia 08/12/2018   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/26/2018   Mesenteric mass 02/19/2015   Midsternal chest pain 11/11/2015   Mixed hyperlipidemia 11/14/2014   Morbid obesity ([place]) 09/07/2018   OSA (obstructive sleep apnea) 12/16/2019   Osteoporosis    Panlobular emphysema ([place]) 11/09/2015   Pneumonia 2013   PONV (postoperative nausea  and vomiting)    Positive D-dimer 09/08/2018   Restless legs syndrome 03/04/2018   Senile purpura ([place]) 08/12/2018   Shortness of breath dyspnea    with exertion   Stasis edema of left lower extremity 04/13/2018   Strain of right shoulder 08/12/2018   Type 2 diabetes mellitus with stage 3 chronic kidney disease ([place]) 08/14/2015   Unstable angina ([place]) 10/30/2018   Vitamin B12 deficiency 04/13/2018   Past Surgical History:  Procedure Laterality Date   ABDOMINAL HYSTERECTOMY     APPENDECTOMY     BREAST SURGERY  40 yrs. ago   growth removed in each breast-benign   CARDIAC CATHETERIZATION     12/14/12 LHC ([facility]): few mild stenotic areas max 40% of ectatic RCA o/w NL coronaries, EF 65%. Med tx.   CATARACT EXTRACTION W/ INTRAOCULAR LENS  IMPLANT, BILATERAL Bilateral 15 yrs ago   CHOLECYSTECTOMY     COLONOSCOPY  04/22/2013   Diverticulosis in the sigmoid colon. Non bleeding internal hemorrhoids. Normal mucosa vascular pattern in the entire examined colon. Three 6 mm polyps n the descending colon. Resected and retrieved.    FOOT SURGERY     LAPAROSCOPIC APPENDECTOMY N/A 02/19/2015   Procedure: EXCISION OF MESENTERIC MASS;  Surgeon: Stark Klein, MD;  Location: [facility];  Service: General;  Laterality: N/A;   REPLACEMENT TOTAL KNEE BILATERAL      Allergies  Allergies  Allergen  Reactions   Codeine Swelling   Levofloxacin     Other reaction(s): Malaise (intolerance)   Penicillins Swelling    Has patient had a PCN reaction causing immediate rash, facial/tongue/throat swelling, SOB or lightheadedness with hypotension:YES Has patient had a PCN reaction causing severe rash involving mucus membranes or skin necrosis: NO Has patient had a PCN reaction that required hospitalization NO Has patient had a PCN reaction occurring within the last 10 years: NO If all of the above answers are "NO", then may proceed with Cephalosporin use.   Tape Rash    History of Present Illness    Danielle [name] is a  88 y.o. female with a hx of paroxysmal atrial flutter on amiodarone, CAD, DM 2, morbid obesity, CKD 3A, hyperlipidemia, chronic diastolic heart failure, history of tobacco use, COPD and chronic respiratory failure on home oxygen last seen while hospitalized.  Initial cardiac catheterization in 2014 with 40% stenosis of RCA.  Nuclear stress test 10/2015 small area of stress-induced ischemia at anterior septal apex, EF 65%.  Coronary CT 10/11/2018 with coronary calcium score of 1190 (95th percentile) with mild diffuse CAD in the entire right RCA, proximal and mid LAD, moderate CAD in the mid left circumflex.  FFR with no significant stenosis.  Echo 09/2018 LVEF 60 to 65%, impaired relaxation, RV normal systolic function.  Seen in clinic 01/25/2022 noting exertional dyspnea and chest discomfort which was atypical.  Troponin T and proBNP were normal.  Echo 01/29/2022 EF 60 to 65%, no RWMA, grade 1 diastolic dysfunction, RV normal systolic function, normal PASP.  She was admitted 02/27/2022 after presenting with chest pain and dyspnea.  Initial EKG in the ED ectopic atrial tachycardia heart rate 131 with subsequent EKG atrial fibrillation 91 bpm.  During admission she had coronary CTA with nonobstructive CAD and FFR with no significant stenosis.  She was started on amiodarone and converted to normal sinus rhythm.  She was discharged on amiodarone Eliquis.  She did require IV diuresis.  Discharged on furosemide, spironolactone, metoprolol.02/27/2022 TSH 0.262 however 03/01/2022 with normal T3 10.2, T40.68  She presents today for follow up with her daughter.[facility] weight 242 lbs and today 245 lbs. she notes she is working with home health physical therapy at home.  She does note some exertional dyspnea and still feeling weak after hospital discharge.  She is hopeful this will improve with increased activity.  Her daughter limits her to 316 ounce bottles of water throughout the day and then she also sips on  milk, juice, coffee.  No chest pain or tachycardia since discharge.  We reviewed medication changes in depth.   EKGs/Labs/Other Studies Reviewed:   The following studies were reviewed today: Coronary CTA 02/28/2022 IMPRESSION: 1. Coronary calcium score of 1391. No percentile available given age greater than 31   2. Normal coronary origin with right dominance.   3. Poor quality study due to noise secondary to obesity, severe coronary calcifications, and patient had PVC during acquisition. However, no obstructive CAD seen   4. Mixed plaque in the proximal LCX causes mild (25-49%) stenosis. High risk plaque features including napkin ring sign, low attenuation plaque, and positive remodeling   5. Calcified plaque causes mild (25-49%) stenosis in proximal/mid/distal RCA, proximal LAD, and OM1   CAD-RADS 2. Mild non-obstructive CAD (25-49%). Consider non-atherosclerotic causes of chest pain. Consider preventive therapy and risk factor modification.   1. Left Main: No significant stenosis   2. LAD: No significant stenosis   3. LCX:  CTFFR 0.86 across lesion in OM1, suggesting lesion is not hemodynamically significant   4. RCA: CTFFR 0.91 across lesions in proximal/mid RCA, suggesting lesion is not hemodynamically significant   IMPRESSION: 1.  CTFFR suggests nonobstructive CAD  Echo 02/04/2022   1. Left ventricular ejection fraction, by estimation, is 60 to 65%. The  left ventricle has normal function. The left ventricle has no regional  wall motion abnormalities. Left ventricular diastolic parameters are  consistent with Grade I diastolic  dysfunction (impaired relaxation).   2. Right ventricular systolic function is normal. The right ventricular  size is normal. There is normal pulmonary artery systolic pressure.   3. The mitral valve is normal in structure. No evidence of mitral valve  regurgitation. No evidence of mitral stenosis.   4. The aortic valve is calcified.  Aortic valve regurgitation is not  visualized. Aortic valve sclerosis/calcification is present, without any  evidence of aortic stenosis.   5. The inferior vena cava is normal in size with greater than 50%  respiratory variability, suggesting right atrial pressure of 3 mmHg.  EKG:  EKG is ordered today.  The ekg ordered today demonstrates sinus bradycardia 59 bpm with sinus arrhythmia and first degree AV block PR 210 ms. No acute ST/T wave changes.    Recent Labs: 01/17/2022: NT-Pro BNP 154 02/26/2022: B Natriuretic Peptide 59.7 02/27/2022: ALT 20; TSH 0.262 02/28/2022: Hemoglobin 11.4; Magnesium 2.2; Platelets 230 03/01/2022: BUN 29; Creatinine, Ser 1.08; Potassium 3.4; Sodium 138  Recent Lipid Panel    Component Value Date/Time   CHOL 155 02/27/2022 0100   CHOL 134 10/22/2018 0952   TRIG 142 02/27/2022 0100   HDL 39 (L) 02/27/2022 0100   HDL 40 10/22/2018 0952   CHOLHDL 4.0 02/27/2022 0100   VLDL 28 02/27/2022 0100   LDLCALC 88 02/27/2022 0100   LDLCALC 67 10/22/2018 0952    Risk Assessment/Calculations:   CHA2DS2-VASc Score = 7   This indicates a 11.2% annual risk of stroke. The patient's score is based upon: CHF History: 1 HTN History: 1 Diabetes History: 1 Stroke History: 0 Vascular Disease History: 1 Age Score: 2 Gender Score: 1     Home Medications   Current Meds  Medication Sig   albuterol (PROVENTIL HFA;VENTOLIN HFA) 108 (90 BASE) MCG/ACT inhaler Inhale 2 puffs into the lungs every 6 (six) hours as needed for wheezing or shortness of breath.    amiodarone (PACERONE) 200 MG tablet Take twice daily for 5 days until 03/06/23, then continue with once daily, starting on 03/06/22.   apixaban (ELIQUIS) 5 MG TABS tablet Take 1 tablet (5 mg total) by mouth 2 (two) times daily.   budesonide-formoterol (SYMBICORT) 160-4.5 MCG/ACT inhaler Inhale 2 puffs into the lungs 2 (two) times daily.   citalopram (CELEXA) 20 MG tablet Take 20 mg by mouth daily.   clonazePAM  (KLONOPIN) 1 MG tablet Take 1 mg by mouth as needed for anxiety (sleep).   diclofenac Sodium (VOLTAREN) 1 % GEL APPLY 2 GRAMS TOPICALLY TO AFFECTED AREA THREE TIMES DAILY (Patient taking differently: Apply 2 g topically daily as needed (joint pain).)   furosemide (LASIX) 20 MG tablet Take 3 tablets (60 mg total) by mouth daily.   glimepiride (AMARYL) 4 MG tablet Take 4 mg by mouth daily.   metFORMIN (GLUCOPHAGE) 500 MG tablet Take 500 mg by mouth 2 (two) times daily.   metoprolol succinate (TOPROL-XL) 25 MG 24 hr tablet Take 25 mg by mouth daily.   pantoprazole (PROTONIX) 40 MG tablet Take  40 mg by mouth 2 (two) times daily.   potassium chloride (MICRO-K) 10 MEQ CR capsule Take 10 mEq by mouth daily.   rOPINIRole (REQUIP) 0.25 MG tablet Take 0.25 mg by mouth at bedtime.   rosuvastatin (CRESTOR) 40 MG tablet Take 40 mg by mouth daily.   sitaGLIPtin (JANUVIA) 100 MG tablet Take 100 mg by mouth daily.   spironolactone (ALDACTONE) 25 MG tablet Take 0.5 tablets (12.5 mg total) by mouth daily.   Vitamin D, Ergocalciferol, (DRISDOL) 50000 UNITS CAPS capsule Take 50,000 Units by mouth every Sunday. No specific days     Review of Systems      All other systems reviewed and are otherwise negative except as noted above.  Physical Exam    VS:  BP 110/62 (BP Location: Left Arm, Patient Position: Sitting, Cuff Size: Large)   Pulse (!) 59   Ht '5\' 3"'$  (1.6 m)   Wt 245 lb 9.6 oz (111.4 kg)   BMI 43.51 kg/m  , BMI Body mass index is 43.51 kg/m.  Wt Readings from Last 3 Encounters:  03/13/22 245 lb 9.6 oz (111.4 kg)  03/01/22 242 lb 12.8 oz (110.1 kg)  01/17/22 245 lb 12.8 oz (111.5 kg)     GEN: Well nourished, overweight, well developed, in no acute distress. HEENT: normal. Neck: Supple, no JVD, carotid bruits, or masses. Cardiac: RRR, no murmurs, rubs, or gallops. No clubbing, cyanosis,.  1+ bilateral pretibial pitting edema.  Radials/PT 2+ and equal bilaterally.  Respiratory:  Respirations  regular and unlabored, clear to auscultation bilaterally. GI: Soft, nontender, nondistended. MS: No deformity or atrophy. Skin: Warm and dry, no rash. Neuro:  Strength and sensation are intact. Psych: Normal affect.  Assessment & Plan    Atrial flutter/amiodarone therapy/hypercoagulable state - Maintaining sinus rhythm post cardioversion.  Continue amiodarone 200 mg daily, Eliquis 5 mg twice daily, metoprolol succinate 25 mg daily.  Refills provided. We discussed her resting heart rate persistently greater than 100 bpm she could take an extra dose of her metoprolol succinate. CHA2DS2-VASc Score = 7 [CHF History: 1, HTN History: 1, Diabetes History: 1, Stroke History: 0, Vascular Disease History: 1, Age Score: 2, Gender Score: 1].  Therefore, the patient's annual risk of stroke is 11.2 %. Labs in the hospital prior to amiodarone initiation showed subclinical hypothyroidism.  Plan to collect repeat thyroid panel, liver enzymes at upcoming follow-up with Dr. Agustin Cree for monitoring on Amiodarone. Her daughter and PCP are concerned about long term effects of Amiodarone, discussed that longevity of Amiodarone can be discussed at follow up but would recommend continuing at this time given recent admission.  Nonobstructive CAD/HLD, LDL goal less than 70- 02/27/22 LDL 88.  Continue rosuvastatin 40 mg daily.  Heart healthy diet encouraged.  Will defer to primary cardiologist whether to add Zetia at follow up.   Diastolic heart CNOBSJG-2+ bilateral pretibial edema.  Continue Lasix 20 mg daily and spironolactone 12.5 mg daily.  Will update BMP today.  If potassium and renal function stable consider increasing dose of spironolactone. Low sodium diet, fluid restriction <2L, and daily weights encouraged. Educated to contact our office for weight gain of 2 lbs overnight or 5 lbs in one week.   COPD-Continue to follow with PCP.  No signs of acute exacerbation  DM2 - Continue to follow with PCP.         Disposition: Follow up in 1 month(s) with Danielle Campus, MD or APP.  Signed, Loel Dubonnet, NP 03/13/2022, 1:46 PM [facility]

## 2022-03-14 LAB — BASIC METABOLIC PANEL
BUN/Creatinine Ratio: 26 (ref 12–28)
BUN: 23 mg/dL (ref 8–27)
CO2: 23 mmol/L (ref 20–29)
Calcium: 9.8 mg/dL (ref 8.7–10.3)
Chloride: 101 mmol/L (ref 96–106)
Creatinine, Ser: 0.87 mg/dL (ref 0.57–1.00)
Glucose: 190 mg/dL — ABNORMAL HIGH (ref 70–99)
Potassium: 4 mmol/L (ref 3.5–5.2)
Sodium: 142 mmol/L (ref 134–144)
eGFR: 65 mL/min/{1.73_m2} (ref >59)

## 2022-03-14 LAB — PRO B NATRIURETIC PEPTIDE: NT-Pro BNP: 269 pg/mL (ref 0–738)

## 2022-03-26 ENCOUNTER — Other Ambulatory Visit: Payer: Self-pay

## 2022-03-26 DIAGNOSIS — I480 Paroxysmal atrial fibrillation: Secondary | ICD-10-CM

## 2022-04-04 ENCOUNTER — Other Ambulatory Visit: Payer: Self-pay

## 2022-04-04 ENCOUNTER — Telehealth: Payer: Self-pay | Admitting: Cardiology

## 2022-04-04 NOTE — Telephone Encounter (Signed)
Called Danielle [name] and informed her of Dr. Joya Gaskins response below:  "I think it be quite unlikely for amiodarone to be the culprit for issues of rash on direct patient and her primary care physician"  Danielle [name] stated that she would reach out to the patients PCP and she had no further questions at this time.

## 2022-04-04 NOTE — Telephone Encounter (Signed)
Pt daughter called in stating that pt has a rash and her home RN said it might be related to her starting amiodarone. Daughter states she spoke to a female earlier today but unsure of who. Please advise.

## 2022-04-04 NOTE — Telephone Encounter (Signed)
Called [name] the patient's daughter and she stated that the the patient has recently developed a rash on her stomach, back arms and legs. Patient was discharged from the hospital on 12/2 and was started on Amiodarone at that time. The home health nurse face timed one of their providers and they were unsure what might be the cause.Please advise

## 2022-04-22 ENCOUNTER — Ambulatory Visit: Payer: PPO | Attending: Cardiology | Admitting: Cardiology

## 2022-04-22 ENCOUNTER — Encounter: Payer: Self-pay | Admitting: Cardiology

## 2022-04-22 VITALS — BP 134/80 | HR 54 | Ht 63.0 in | Wt 245.2 lb

## 2022-04-22 DIAGNOSIS — J431 Panlobular emphysema: Secondary | ICD-10-CM

## 2022-04-22 DIAGNOSIS — R609 Edema, unspecified: Secondary | ICD-10-CM | POA: Diagnosis not present

## 2022-04-22 DIAGNOSIS — I2510 Atherosclerotic heart disease of native coronary artery without angina pectoris: Secondary | ICD-10-CM

## 2022-04-22 DIAGNOSIS — R0609 Other forms of dyspnea: Secondary | ICD-10-CM | POA: Diagnosis not present

## 2022-04-22 DIAGNOSIS — I480 Paroxysmal atrial fibrillation: Secondary | ICD-10-CM

## 2022-04-22 DIAGNOSIS — I5032 Chronic diastolic (congestive) heart failure: Secondary | ICD-10-CM

## 2022-04-22 NOTE — Patient Instructions (Addendum)
Medication Instructions:   STOP: Metoprolol   Lab Work: BMP, ProBNP-today If you have labs (blood work) drawn today and your tests are completely normal, you will receive your results only by: [place] (if you have MyChart) OR A paper copy in the mail If you have any lab test that is abnormal or we need to change your treatment, we will call you to review the results.   Testing/Procedures: None Ordered   Follow-Up: At [facility], you and your health needs are our priority.  As part of our continuing mission to provide you with exceptional heart care, we have created designated Provider Care Teams.  These Care Teams include your primary Cardiologist (physician) and Advanced Practice Providers (APPs -  Physician Assistants and Nurse Practitioners) who all work together to provide you with the care you need, when you need it.  We recommend signing up for the patient portal called "MyChart".  Sign up information is provided on this After Visit Summary.  MyChart is used to connect with patients for Virtual Visits (Telemedicine).  Patients are able to view lab/test results, encounter notes, upcoming appointments, etc.  Non-urgent messages can be sent to your provider as well.   To learn more about what you can do with MyChart, go to [email or web address].    Your next appointment:   3 month(s)  The format for your next appointment:   In Person  Provider:   [name], MD    Other Instructions NA

## 2022-04-22 NOTE — Progress Notes (Signed)
Cardiology Office Note:    Date:  04/22/2022   ID:  Danielle [name], DOB 1933/09/17, MRN [id number]  PCP:  [name], MD  Cardiologist:  [name], MD    Referring MD: [name], MD   Chief Complaint  Patient presents with   Shortness of Breath   Medication Management   Leg Swelling    History of Present Illness:    Danielle [name] is a 89 y.o. female with past medical history significant for COPD, coronary artery disease, type 2 diabetes, morbid obesity, chronic kidney failure, diastolic congestive heart failure.  Recently she was admitted to [facility] in the middle of December secondary to tachycardia, she was found to be in atrial fibrillation.  She was put on amiodarone converted to sinus rhythm, she was also diuresed with improvement in overall wellbeing.  In the hospital she had echocardiogram done which showed normal left ventricle ejection fraction, she also had coronary CT angio done which showed nonobstructive disease. She is in my office today for follow-up.  Overall she says she is doing fine but complaining of fatigue tiredness and shortness of breath she also complained of having some swollen lower extremities but I do not see this much on physical exam.  Past Medical History:  Diagnosis Date   Anginal pain (HCC)    Arthritis    Asthma    At high risk for falls 11/11/2017   Chest pain 11/09/2015   CHF (congestive heart failure) (HCC)    Chronic diastolic congestive heart failure ([place]) 11/11/2015   Chronic insomnia 10/24/2015   Chronic pain of both knees 05/14/2018   Chronic respiratory failure with hypoxia ([place]) 05/05/2017   CKD (chronic kidney disease) stage 3, GFR 30-59 ml/min ([place]) 05/07/2017   COPD (chronic obstructive pulmonary disease) ([place])    Coronary artery disease    Coronary artery disease involving native coronary artery of native heart without angina pectoris 11/14/2014   Overview:  40% RCA a cardiac catheter in 2014   Current mild  episode of major depressive disorder ([place]) 11/14/2014   Depression    DOE (dyspnea on exertion) 09/07/2018   Dysphagia 08/12/2018   Edema 08/12/2018   Elevated blood sugar 11/09/2015   Emphysema lung (HCC)    Flat feet, bilateral 05/30/2020   Gastroesophageal reflux disease without esophagitis 11/11/2017   GERD (gastroesophageal reflux disease)    Headache    History of hiatal hernia    Hyperlipidemia    Hypertension    Insomnia    Internal hemorrhoids 11/26/2018   Iron deficiency anemia 08/12/2018   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/26/2018   Mesenteric mass 02/19/2015   Midsternal chest pain 11/11/2015   Mixed hyperlipidemia 11/14/2014   Morbid obesity ([place]) 09/07/2018   OSA (obstructive sleep apnea) 12/16/2019   Osteoporosis    Panlobular emphysema ([place]) 11/09/2015   Pneumonia 2013   PONV (postoperative nausea and vomiting)    Positive D-dimer 09/08/2018   Restless legs syndrome 03/04/2018   Senile purpura ([place]) 08/12/2018   Shortness of breath dyspnea    with exertion   Stasis edema of left lower extremity 04/13/2018   Strain of right shoulder 08/12/2018   Type 2 diabetes mellitus with stage 3 chronic kidney disease ([place]) 08/14/2015   Unstable angina ([place]) 10/30/2018   Vitamin B12 deficiency 04/13/2018    Past Surgical History:  Procedure Laterality Date   ABDOMINAL HYSTERECTOMY     APPENDECTOMY     BREAST SURGERY  40 yrs. ago   growth  removed in each breast-benign   CARDIAC CATHETERIZATION     12/14/12 LHC ([facility]): few mild stenotic areas max 40% of ectatic RCA o/w NL coronaries, EF 65%. Med tx.   CATARACT EXTRACTION W/ INTRAOCULAR LENS  IMPLANT, BILATERAL Bilateral 15 yrs ago   CHOLECYSTECTOMY     COLONOSCOPY  04/22/2013   Diverticulosis in the sigmoid colon. Non bleeding internal hemorrhoids. Normal mucosa vascular pattern in the entire examined colon. Three 6 mm polyps n the descending colon. Resected and retrieved.    FOOT SURGERY     LAPAROSCOPIC APPENDECTOMY N/A 02/19/2015   Procedure: EXCISION OF  MESENTERIC MASS;  Surgeon: Stark Klein, MD;  Location: [facility];  Service: General;  Laterality: N/A;   REPLACEMENT TOTAL KNEE BILATERAL      Current Medications: Current Meds  Medication Sig   albuterol (PROVENTIL HFA;VENTOLIN HFA) 108 (90 BASE) MCG/ACT inhaler Inhale 2 puffs into the lungs every 6 (six) hours as needed for wheezing or shortness of breath.    amiodarone (PACERONE) 200 MG tablet Take 1 tablet (200 mg total) by mouth daily.   apixaban (ELIQUIS) 5 MG TABS tablet Take 1 tablet (5 mg total) by mouth 2 (two) times daily.   budesonide-formoterol (SYMBICORT) 160-4.5 MCG/ACT inhaler Inhale 2 puffs into the lungs 2 (two) times daily.   citalopram (CELEXA) 20 MG tablet Take 20 mg by mouth daily.   clonazePAM (KLONOPIN) 1 MG tablet Take 1 mg by mouth as needed for anxiety (sleep).   diclofenac Sodium (VOLTAREN) 1 % GEL APPLY 2 GRAMS TOPICALLY TO AFFECTED AREA THREE TIMES DAILY (Patient taking differently: Apply 2 g topically daily as needed (joint pain).)   furosemide (LASIX) 20 MG tablet Take 3 tablets (60 mg total) by mouth daily.   glimepiride (AMARYL) 4 MG tablet Take 4 mg by mouth daily.   influenza vaccine adjuvanted (FLUAD) 0.5 ML injection Inject into the muscle. (Patient taking differently: Inject 0.5 mLs into the muscle once.)   metFORMIN (GLUCOPHAGE) 500 MG tablet Take 500 mg by mouth 2 (two) times daily.   metoprolol succinate (TOPROL-XL) 25 MG 24 hr tablet Take 1 tablet (25 mg total) by mouth daily.   nitroGLYCERIN (NITROSTAT) 0.4 MG SL tablet Place 1 tablet (0.4 mg total) under the tongue every 5 (five) minutes as needed for chest pain.   pantoprazole (PROTONIX) 40 MG tablet Take 40 mg by mouth 2 (two) times daily.   potassium chloride (MICRO-K) 10 MEQ CR capsule Take 10 mEq by mouth daily.   rOPINIRole (REQUIP) 0.25 MG tablet Take 0.25 mg by mouth at bedtime.   rosuvastatin (CRESTOR) 40 MG tablet Take 40 mg by mouth daily.   sitaGLIPtin (JANUVIA) 100 MG tablet Take 100 mg  by mouth daily.   spironolactone (ALDACTONE) 25 MG tablet Take 0.5 tablets (12.5 mg total) by mouth daily.   Vitamin D, Ergocalciferol, (DRISDOL) 50000 UNITS CAPS capsule Take 50,000 Units by mouth every Sunday. No specific days     Allergies:   Codeine, Levofloxacin, Penicillins, and Tape   Social History   Socioeconomic History   Marital status: Married    Spouse name: Not on file   Number of children: Not on file   Years of education: Not on file   Highest education level: Not on file  Occupational History   Not on file  Tobacco Use   Smoking status: Former    Packs/day: 0.25    Types: Cigarettes    Quit date: 08/21/2016    Years since quitting: 5.6  Smokeless tobacco: Never  Vaping Use   Vaping Use: Never used  Substance and Sexual Activity   Alcohol use: No   Drug use: No   Sexual activity: Not on file  Other Topics Concern   Not on file  Social History Narrative   Not on file   Social Determinants of Health   Financial Resource Strain: Not on file  Food Insecurity: No Food Insecurity (02/27/2022)   Hunger Vital Sign    Worried About Running Out of Food in the Last Year: Never true    Ran Out of Food in the Last Year: Never true  Transportation Needs: No Transportation Needs (02/27/2022)   PRAPARE - Hydrologist (Medical): No    Lack of Transportation (Non-Medical): No  Physical Activity: Not on file  Stress: Not on file  Social Connections: Not on file     Family History: The patient's family history includes Heart attack in her mother. ROS:   Please see the history of present illness.    All 14 point review of systems negative except as described per history of present illness  EKGs/Labs/Other Studies Reviewed:      Recent Labs: 02/26/2022: B Natriuretic Peptide 59.7 02/27/2022: ALT 20; TSH 0.262 02/28/2022: Hemoglobin 11.4; Magnesium 2.2; Platelets 230 03/13/2022: BUN 23; Creatinine, Ser 0.87; NT-Pro BNP 269;  Potassium 4.0; Sodium 142  Recent Lipid Panel    Component Value Date/Time   CHOL 155 02/27/2022 0100   CHOL 134 10/22/2018 0952   TRIG 142 02/27/2022 0100   HDL 39 (L) 02/27/2022 0100   HDL 40 10/22/2018 0952   CHOLHDL 4.0 02/27/2022 0100   VLDL 28 02/27/2022 0100   LDLCALC 88 02/27/2022 0100   LDLCALC 67 10/22/2018 0952    Physical Exam:    VS:  BP 134/80 (BP Location: Left Arm, Patient Position: Sitting)   Pulse (!) 54   Ht '5\' 3"'$  (1.6 m)   Wt 245 lb 3.2 oz (111.2 kg)   SpO2 95%   BMI 43.44 kg/m     Wt Readings from Last 3 Encounters:  04/22/22 245 lb 3.2 oz (111.2 kg)  03/13/22 245 lb 9.6 oz (111.4 kg)  03/01/22 242 lb 12.8 oz (110.1 kg)     GEN:  Well nourished, well developed in no acute distress HEENT: Normal NECK: No JVD; No carotid bruits LYMPHATICS: No lymphadenopathy CARDIAC: RRR, no murmurs, no rubs, no gallops RESPIRATORY:  Clear to auscultation without rales, wheezing or rhonchi  ABDOMEN: Soft, non-tender, non-distended MUSCULOSKELETAL:  No edema; No deformity  SKIN: Warm and dry LOWER EXTREMITIES: no swelling NEUROLOGIC:  Alert and oriented x 3 PSYCHIATRIC:  Normal affect   ASSESSMENT:    1. Edema, unspecified type   2. Dyspnea on exertion   3. Paroxysmal atrial fibrillation (HCC)   4. Chronic diastolic congestive heart failure ([place])   5. Coronary artery disease involving native coronary artery of native heart without angina pectoris   6. Panlobular emphysema ([place])    PLAN:    In order of problems listed above:  Emphysema/COPD.  Noted.  This clearly, contributing to his symptomatology.  She is complaining of having some shortness of breath it could be cardiac related or COPD related.  Cardiac workup so far has been negative.  I will check proBNP as well Chem-7 to check if he is dealing with any subclinical congestive heart failure.  Based on that we will decide course of action which may involve changing to a different  diuretic if truly does  component of CHF. Paroxysmal atrial fibrillation, maintained sinus rhythm, discontinue her metoprolol and she is on amiodarone 200 mg daily which I continue difficult to interpret EKG because of shaking baseline but rate is regular.  Continue anticoagulation with Eliquis 5 mg twice daily which is appropriate to her weight age and kidney function. Chronic diastolic congestive heart failure and on physical exam I do not see evidence of decompensation however it somewhat difficult to assess because of her obesity will do proBNP and Chem-7. I did review record for this visit from the hospital. Coronary disease last coronary CT angio negative.   Medication Adjustments/Labs and Tests Ordered: Current medicines are reviewed at length with the patient today.  Concerns regarding medicines are outlined above.  Orders Placed This Encounter  Procedures   Pro b natriuretic peptide (BNP)   Basic metabolic panel   EKG 94-RDEY   Medication changes: No orders of the defined types were placed in this encounter.   Signed, Park Liter, MD, [facility] 04/22/2022 4:19 PM    [facility]

## 2022-04-23 LAB — BASIC METABOLIC PANEL
BUN/Creatinine Ratio: 28 (ref 12–28)
BUN: 25 mg/dL (ref 8–27)
CO2: 25 mmol/L (ref 20–29)
Calcium: 9.9 mg/dL (ref 8.7–10.3)
Chloride: 100 mmol/L (ref 96–106)
Creatinine, Ser: 0.88 mg/dL (ref 0.57–1.00)
Glucose: 102 mg/dL — ABNORMAL HIGH (ref 70–99)
Potassium: 4.1 mmol/L (ref 3.5–5.2)
Sodium: 142 mmol/L (ref 134–144)
eGFR: 64 mL/min/{1.73_m2} (ref >59)

## 2022-04-23 LAB — PRO B NATRIURETIC PEPTIDE: NT-Pro BNP: 465 pg/mL (ref 0–738)

## 2022-04-24 ENCOUNTER — Telehealth: Payer: Self-pay | Admitting: Cardiology

## 2022-04-24 NOTE — Telephone Encounter (Signed)
Returned [name]'s call. Aware lab results are available and once the doctor provides feedback I will call her. Message sent to Dr. Raliegh Ip.

## 2022-04-24 NOTE — Telephone Encounter (Signed)
Pt's daughter is requesting return call in regards to lab results.

## 2022-04-25 ENCOUNTER — Telehealth: Payer: Self-pay

## 2022-04-25 NOTE — Telephone Encounter (Signed)
Spoke with [name], notified of the following per Dr. Raliegh Ip

## 2022-04-25 NOTE — Telephone Encounter (Signed)
-----  Message from Park Liter, MD sent at 04/25/2022  8:55 AM EST ----- Lab work test did not show any evidence of congestive heart failure, so I suspect her shortness of breath probably comes mostly from her lungs issue.  I will not change any cardiac medication at this stage

## 2022-04-27 ENCOUNTER — Other Ambulatory Visit (HOSPITAL_BASED_OUTPATIENT_CLINIC_OR_DEPARTMENT_OTHER): Payer: Self-pay | Admitting: Family

## 2022-04-27 DIAGNOSIS — I480 Paroxysmal atrial fibrillation: Secondary | ICD-10-CM

## 2022-04-28 NOTE — Telephone Encounter (Signed)
Patient of Dr. Krasowski. Please review for refill. Thank you!  

## 2022-06-04 ENCOUNTER — Ambulatory Visit: Payer: PPO | Admitting: Podiatry

## 2022-06-05 ENCOUNTER — Ambulatory Visit: Payer: PPO | Admitting: Podiatry

## 2022-06-11 ENCOUNTER — Encounter: Payer: Self-pay | Admitting: Podiatry

## 2022-06-11 ENCOUNTER — Ambulatory Visit: Payer: PPO | Admitting: Podiatry

## 2022-06-11 DIAGNOSIS — B351 Tinea unguium: Secondary | ICD-10-CM

## 2022-06-11 DIAGNOSIS — E1140 Type 2 diabetes mellitus with diabetic neuropathy, unspecified: Secondary | ICD-10-CM

## 2022-06-11 DIAGNOSIS — M79675 Pain in left toe(s): Secondary | ICD-10-CM | POA: Diagnosis not present

## 2022-06-11 DIAGNOSIS — L84 Corns and callosities: Secondary | ICD-10-CM

## 2022-06-11 DIAGNOSIS — I739 Peripheral vascular disease, unspecified: Secondary | ICD-10-CM

## 2022-06-11 DIAGNOSIS — M79674 Pain in right toe(s): Secondary | ICD-10-CM

## 2022-06-11 NOTE — Progress Notes (Signed)
Subjective:  Patient ID: Danielle [name], female    DOB: 07/09/1933,   MRN: [id number]  Chief Complaint  Patient presents with   Nail Problem    Diabetic Foot Care     89 y.o. female presents for concern of thickened elongated and painful nails that are difficult to trim. Requesting to have them trimmed today. Relates burning and tingling in their feet. Patient is diabetic and last A1c was  Lab Results  Component Value Date   HGBA1C 10.2 (H) 02/27/2022   .   PCP:  [name], MD    . Denies any other pedal complaints. Denies n/v/f/c.   Past Medical History:  Diagnosis Date   Anginal pain ([place])    Arthritis    Asthma    At high risk for falls 11/11/2017   Chest pain 11/09/2015   CHF (congestive heart failure) (HCC)    Chronic diastolic congestive heart failure ([place]) 11/11/2015   Chronic insomnia 10/24/2015   Chronic pain of both knees 05/14/2018   Chronic respiratory failure with hypoxia ([place]) 05/05/2017   CKD (chronic kidney disease) stage 3, GFR 30-59 ml/min (HCC) 05/07/2017   COPD (chronic obstructive pulmonary disease) ([place])    Coronary artery disease    Coronary artery disease involving native coronary artery of native heart without angina pectoris 11/14/2014   Overview:  40% RCA a cardiac catheter in 2014   Current mild episode of major depressive disorder ([place]) 11/14/2014   Depression    DOE (dyspnea on exertion) 09/07/2018   Dysphagia 08/12/2018   Edema 08/12/2018   Elevated blood sugar 11/09/2015   Emphysema lung (HCC)    Flat feet, bilateral 05/30/2020   Gastroesophageal reflux disease without esophagitis 11/11/2017   GERD (gastroesophageal reflux disease)    Headache    History of hiatal hernia    Hyperlipidemia    Hypertension    Insomnia    Internal hemorrhoids 11/26/2018   Iron deficiency anemia 08/12/2018   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/26/2018   Mesenteric mass 02/19/2015   Midsternal chest pain 11/11/2015   Mixed hyperlipidemia 11/14/2014   Morbid obesity ([place]) 09/07/2018    OSA (obstructive sleep apnea) 12/16/2019   Osteoporosis    Panlobular emphysema ([place]) 11/09/2015   Pneumonia 2013   PONV (postoperative nausea and vomiting)    Positive D-dimer 09/08/2018   Restless legs syndrome 03/04/2018   Senile purpura ([place]) 08/12/2018   Shortness of breath dyspnea    with exertion   Stasis edema of left lower extremity 04/13/2018   Strain of right shoulder 08/12/2018   Type 2 diabetes mellitus with stage 3 chronic kidney disease ([place]) 08/14/2015   Unstable angina ([place]) 10/30/2018   Vitamin B12 deficiency 04/13/2018    Objective:  Physical Exam: Vascular: DP/PT pulses 2/4 bilateral. CFT <3 seconds. Absent hair growth on digits. Edema noted to bilateral lower extremities. Xerosis noted bilaterally.  Skin. No lacerations or abrasions bilateral feet. Nails 1-5 bilateral  are thickened discolored and elongated with subungual debris. Hyperkeratotic lesions noted to left fifth toe, right great toe and first metatrsal head.  Musculoskeletal: MMT 5/5 bilateral lower extremities in DF, PF, Inversion and Eversion. Deceased ROM in DF of ankle joint.  Neurological: Sensation intact to light touch. Protective sensation diminished bilateral.    Assessment:   1. Pain due to onychomycosis of toenails of both feet   2. Corns and callosities   3. PVD (peripheral vascular disease) ([place])   4. Type 2 diabetes mellitus with diabetic neuropathy, unspecified whether long  term insulin use ([place])      Plan:  Patient was evaluated and treated and all questions answered. -Discussed and educated patient on diabetic foot care, especially with  regards to the vascular, neurological and musculoskeletal systems.  -Stressed the importance of good glycemic control and the detriment of not  controlling glucose levels in relation to the foot. -Discussed supportive shoes at all times and checking feet regularly.  -Mechanically debrided all nails 1-5 bilateral using sterile nail nipper and filed with  dremel without incident  -Hyperkeratotic lesions debrided without incident with chisel.  -Answered all patient questions -Patient to return  in 3 months for at risk foot care -Patient advised to call the office if any problems or questions arise in the meantime.   Lorenda Peck, DPM

## 2022-08-05 ENCOUNTER — Ambulatory Visit: Payer: PPO | Admitting: Cardiology

## 2022-08-19 NOTE — Progress Notes (Unsigned)
Cardiology Office Note:    Date:  08/21/2022   ID:  Danielle [name], DOB 1934-02-19, MRN [id number]  PCP:  Gordan Payment., MD   [facility] Providers Cardiologist:  Gypsy Balsam, MD     Referring MD: Gordan Payment., MD   CC: follow up AF  History of Present Illness:    Danielle [name] is a 89 y.o. female with a hx of nonobstructive CAD, congestive heart failure, hypertension, atrial tachycardia, PAF on Eliquis and amiodarone, panlobular emphysema, OSA, COPD, GERD, DM 2, CKD stage III A.  Echo on 02/05/2022 revealed an EF of 60 to 65%, grade 1 DD.  On 02/28/2022 she underwent coronary CTA which revealed a calcium score of 1391, FFR revealed nonobstructive CAD.  Most recently she was evaluated by Dr. Bing Matter on 04/22/2022, at this time she was doing well from a cardiac perspective, she did endorse some fatigue and shortness of breath but this was felt to be related to her emphysema/COPD.  She presents today accompanied by her daughter for follow-up of her CAD and atrial fibrillation.  She offers no complaints related to her health, says she has felt the same since she was last evaluated in our office.  She does have pedal edema that typically decreases somewhat overnight.  She enjoys sitting at a table and coloring for several hours through the day, she is not able to prop her legs up while she is doing this.  She is not able to facilitate getting compression stockings on.  Her daughter states that she has a hard time adhering to sodium restriction. She sleeps in a recliner at night for comfort, wears O2 HS 2 lpm each evening. She denies chest pain, palpitations, dyspnea, pnd, orthopnea, n, v, dizziness, syncope, weight gain, or early satiety. She denies hematochezia, hematuria, hemoptysis.  Past Medical History:  Diagnosis Date   Anginal pain (HCC)    Arthritis    Asthma    At high risk for falls 11/11/2017   Chest pain 11/09/2015   CHF (congestive heart  failure) (HCC)    Chronic diastolic congestive heart failure (HCC) 11/11/2015   Chronic insomnia 10/24/2015   Chronic pain of both knees 05/14/2018   Chronic respiratory failure with hypoxia (HCC) 05/05/2017   CKD (chronic kidney disease) stage 3, GFR 30-59 ml/min (HCC) 05/07/2017   COPD (chronic obstructive pulmonary disease) (HCC)    Coronary artery disease    Coronary artery disease involving native coronary artery of native heart without angina pectoris 11/14/2014   Overview:  40% RCA a cardiac catheter in 2014   Current mild episode of major depressive disorder (HCC) 11/14/2014   Depression    DOE (dyspnea on exertion) 09/07/2018   Dysphagia 08/12/2018   Edema 08/12/2018   Elevated blood sugar 11/09/2015   Emphysema lung (HCC)    Flat feet, bilateral 05/30/2020   Gastroesophageal reflux disease without esophagitis 11/11/2017   GERD (gastroesophageal reflux disease)    Headache    History of hiatal hernia    Hyperlipidemia    Hypertension    Insomnia    Internal hemorrhoids 11/26/2018   Iron deficiency anemia 08/12/2018   Malaise and fatigue 08/14/2015   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/26/2018   Mesenteric mass 02/19/2015   Midsternal chest pain 11/11/2015   Mixed hyperlipidemia 11/14/2014   Morbid obesity (HCC) 09/07/2018   OSA (obstructive sleep apnea) 12/16/2019   Osteoporosis    Panlobular emphysema (HCC) 11/09/2015   Pneumonia 2013   PONV (postoperative nausea and vomiting)  Positive D-dimer 09/08/2018   Primary osteoarthritis involving multiple joints 08/14/2015   Restless legs syndrome 03/04/2018   Senile purpura (HCC) 08/12/2018   Shortness of breath dyspnea    with exertion   Stasis edema of left lower extremity 04/13/2018   Strain of right shoulder 08/12/2018   Type 2 diabetes mellitus with stage 3 chronic kidney disease (HCC) 08/14/2015   Unstable angina (HCC) 10/30/2018   Vitamin B12 deficiency 04/13/2018   Vitamin D deficiency 02/27/2016    Past Surgical  History:  Procedure Laterality Date   ABDOMINAL HYSTERECTOMY     APPENDECTOMY     BREAST SURGERY  40 yrs. ago   growth removed in each breast-benign   CARDIAC CATHETERIZATION     12/14/12 LHC ([facility]): few mild stenotic areas max 40% of ectatic RCA o/w NL coronaries, EF 65%. Med tx.   CATARACT EXTRACTION W/ INTRAOCULAR LENS  IMPLANT, BILATERAL Bilateral 15 yrs ago   CHOLECYSTECTOMY     COLONOSCOPY  04/22/2013   Diverticulosis in the sigmoid colon. Non bleeding internal hemorrhoids. Normal mucosa vascular pattern in the entire examined colon. Three 6 mm polyps n the descending colon. Resected and retrieved.    FOOT SURGERY     LAPAROSCOPIC APPENDECTOMY N/A 02/19/2015   Procedure: EXCISION OF MESENTERIC MASS;  Surgeon: Almond Lint, MD;  Location: [facility] OR;  Service: General;  Laterality: N/A;   REPLACEMENT TOTAL KNEE BILATERAL      Current Medications: Current Meds  Medication Sig   albuterol (PROVENTIL HFA;VENTOLIN HFA) 108 (90 BASE) MCG/ACT inhaler Inhale 2 puffs into the lungs every 6 (six) hours as needed for wheezing or shortness of breath.    amiodarone (PACERONE) 200 MG tablet Take 1 tablet (200 mg total) by mouth daily.   apixaban (ELIQUIS) 5 MG TABS tablet Take 1 tablet (5 mg total) by mouth 2 (two) times daily.   budesonide-formoterol (SYMBICORT) 160-4.5 MCG/ACT inhaler Inhale 2 puffs into the lungs 2 (two) times daily.   citalopram (CELEXA) 20 MG tablet Take 20 mg by mouth daily.   clonazePAM (KLONOPIN) 1 MG tablet Take 1 mg by mouth as needed for anxiety (sleep).   diclofenac Sodium (VOLTAREN) 1 % GEL APPLY 2 GRAMS TOPICALLY TO AFFECTED AREA THREE TIMES DAILY (Patient taking differently: Apply 2 g topically daily as needed (joint pain).)   ferrous sulfate 325 (65 FE) MG EC tablet Take 325 mg by mouth daily with breakfast.   furosemide (LASIX) 20 MG tablet Take 3 tablets (60 mg total) by mouth daily.   glimepiride (AMARYL) 4 MG tablet Take 4 mg by mouth daily.   metFORMIN  (GLUCOPHAGE) 500 MG tablet Take 500 mg by mouth 2 (two) times daily.   Multiple Vitamin (MULTIVITAMIN) capsule Take 1 capsule by mouth daily.   nitroGLYCERIN (NITROSTAT) 0.4 MG SL tablet Place 1 tablet (0.4 mg total) under the tongue every 5 (five) minutes as needed for chest pain.   nystatin (MYCOSTATIN/NYSTOP) powder Apply 1 Application topically 2 (two) times daily as needed (yeast).   OVER THE COUNTER MEDICATION Take 1 tablet by mouth at bedtime. Nurvive for restless leg   pantoprazole (PROTONIX) 40 MG tablet Take 40 mg by mouth 2 (two) times daily.   potassium chloride (MICRO-K) 10 MEQ CR capsule Take 10 mEq by mouth daily.   rOPINIRole (REQUIP) 0.25 MG tablet Take 0.25 mg by mouth at bedtime.   rosuvastatin (CRESTOR) 40 MG tablet Take 40 mg by mouth daily.   sitaGLIPtin (JANUVIA) 100 MG tablet Take 100 mg  by mouth daily.   spironolactone (ALDACTONE) 25 MG tablet Take 0.5 tablets (12.5 mg total) by mouth daily.   Vitamin D, Ergocalciferol, (DRISDOL) 50000 UNITS CAPS capsule Take 50,000 Units by mouth every Sunday. No specific days     Allergies:   Codeine, Levofloxacin, Penicillins, and Tape   Social History   Socioeconomic History   Marital status: Married    Spouse name: Not on file   Number of children: Not on file   Years of education: Not on file   Highest education level: Not on file  Occupational History   Not on file  Tobacco Use   Smoking status: Former    Packs/day: .25    Types: Cigarettes    Quit date: 08/21/2016    Years since quitting: 6.0   Smokeless tobacco: Never  Vaping Use   Vaping Use: Never used  Substance and Sexual Activity   Alcohol use: No   Drug use: No   Sexual activity: Not on file  Other Topics Concern   Not on file  Social History Narrative   Not on file   Social Determinants of Health   Financial Resource Strain: Not on file  Food Insecurity: No Food Insecurity (02/27/2022)   Hunger Vital Sign    Worried About Running Out of Food in  the Last Year: Never true    Ran Out of Food in the Last Year: Never true  Transportation Needs: No Transportation Needs (02/27/2022)   PRAPARE - Administrator, Civil Service (Medical): No    Lack of Transportation (Non-Medical): No  Physical Activity: Not on file  Stress: Not on file  Social Connections: Not on file     Family History: The patient's family history includes Heart attack in her mother.  ROS:   Please see the history of present illness.     All other systems reviewed and are negative.  EKGs/Labs/Other Studies Reviewed:    The following studies were reviewed today: Cardiac Studies & Procedures     STRESS TESTS  NM MYOCAR MULTI W/SPECT W 11/11/2015  Narrative CLINICAL DATA:  Chest pain  EXAM: MYOCARDIAL IMAGING WITH SPECT (REST AND PHARMACOLOGIC-STRESS)  GATED LEFT VENTRICULAR WALL MOTION STUDY  LEFT VENTRICULAR EJECTION FRACTION  TECHNIQUE: Standard myocardial SPECT imaging was performed after resting intravenous injection of 10 mCi Tc-25m tetrofosmin. Subsequently, intravenous infusion of Lexiscan was performed under the supervision of the Cardiology staff. At peak effect of the drug, 30 mCi Tc-52m tetrofosmin was injected intravenously and standard myocardial SPECT imaging was performed. Quantitative gated imaging was also performed to evaluate left ventricular wall motion, and estimate left ventricular ejection fraction.  COMPARISON:  None.  FINDINGS: Perfusion: There is a small area of stress-induced ischemia in the anterior septal apex. No large fixed defects.  Wall Motion: Normal left ventricular wall motion. No left ventricular dilation.  Left Ventricular Ejection Fraction: 65 %  End diastolic volume 100 ml  End systolic volume 35 ml  IMPRESSION: 1. Small area of stress-induced ischemia at the anteroseptal apex.  2. Normal left ventricular wall motion.  3. Left ventricular ejection fraction 65%  4. Non invasive risk  stratification*: Low risk.  *2012 Appropriate Use Criteria for Coronary Revascularization Focused Update: J Am Coll Cardiol. 2012;59(9):857-881. http://content.dementiazones.com.aspx?articleid=1201161   Electronically Signed By: Jolaine Click M.D. On: 11/11/2015 12:36   ECHOCARDIOGRAM  ECHOCARDIOGRAM COMPLETE 02/05/2022  Narrative ECHOCARDIOGRAM REPORT    Patient Name:   Danielle [name] Date of Exam: 02/04/2022 Medical Rec #:  [id number]            Height:       63.0 in Accession #:    [id number]           Weight:       245.8 lb Date of Birth:  Jun 02, 1933            BSA:          2.111 m Patient Age:    88 years             BP:           138/60 mmHg Patient Gender: F                    HR:           67 bpm. Exam Location:  [place]  Procedure: 2D Echo, Cardiac Doppler, Color Doppler and Strain Analysis  Indications:    Dyspnea on exertion [R06.09 (ICD-10-CM)]  History:        Patient has prior history of Echocardiogram examinations, most recent 08/24/2019. COPD; Signs/Symptoms:Chest Pain and Dyspnea.  Sonographer:    Louie Boston RDCS Referring Phys: [phone] Marveen Reeks KRASOWSKI  IMPRESSIONS   1. Left ventricular ejection fraction, by estimation, is 60 to 65%. The left ventricle has normal function. The left ventricle has no regional wall motion abnormalities. Left ventricular diastolic parameters are consistent with Grade I diastolic dysfunction (impaired relaxation). 2. Right ventricular systolic function is normal. The right ventricular size is normal. There is normal pulmonary artery systolic pressure. 3. The mitral valve is normal in structure. No evidence of mitral valve regurgitation. No evidence of mitral stenosis. 4. The aortic valve is calcified. Aortic valve regurgitation is not visualized. Aortic valve sclerosis/calcification is present, without any evidence of aortic stenosis. 5. The inferior vena cava is normal in size with greater than 50% respiratory  variability, suggesting right atrial pressure of 3 mmHg.  FINDINGS Left Ventricle: Left ventricular ejection fraction, by estimation, is 60 to 65%. The left ventricle has normal function. The left ventricle has no regional wall motion abnormalities. The left ventricular internal cavity size was normal in size. There is no left ventricular hypertrophy. Left ventricular diastolic parameters are consistent with Grade I diastolic dysfunction (impaired relaxation).  Right Ventricle: The right ventricular size is normal. No increase in right ventricular wall thickness. Right ventricular systolic function is normal. There is normal pulmonary artery systolic pressure. The tricuspid regurgitant velocity is 2.66 m/s, and with an assumed right atrial pressure of 3 mmHg, the estimated right ventricular systolic pressure is 31.3 mmHg.  Left Atrium: Left atrial size was normal in size.  Right Atrium: Right atrial size was normal in size.  Pericardium: There is no evidence of pericardial effusion.  Mitral Valve: The mitral valve is normal in structure. No evidence of mitral valve regurgitation. No evidence of mitral valve stenosis.  Tricuspid Valve: The tricuspid valve is normal in structure. Tricuspid valve regurgitation is mild . No evidence of tricuspid stenosis.  Aortic Valve: The aortic valve is calcified. Aortic valve regurgitation is not visualized. Aortic valve sclerosis/calcification is present, without any evidence of aortic stenosis.  Pulmonic Valve: The pulmonic valve was normal in structure. Pulmonic valve regurgitation is not visualized. No evidence of pulmonic stenosis.  Aorta: The aortic root is normal in size and structure.  Venous: The inferior vena cava is normal in size with greater than 50% respiratory variability, suggesting right atrial pressure of 3 mmHg.  IAS/Shunts: No  atrial level shunt detected by color flow Doppler.   LEFT VENTRICLE PLAX 2D LVIDd:         5.10 cm    Diastology LVIDs:         3.50 cm   LV e' medial:    5.77 cm/s LV PW:         1.00 cm   LV E/e' medial:  15.5 LV IVS:        1.00 cm   LV e' lateral:   8.05 cm/s LVOT diam:     2.00 cm   LV E/e' lateral: 11.1 LV SV:         92 LV SV Index:   43 LVOT Area:     3.14 cm   RIGHT VENTRICLE             IVC RV S prime:     11.90 cm/s  IVC diam: 1.80 cm TAPSE (M-mode): 2.2 cm  LEFT ATRIUM             Index        RIGHT ATRIUM           Index LA diam:        4.30 cm 2.04 cm/m   RA Area:     16.70 cm LA Vol (A2C):   70.4 ml 33.35 ml/m  RA Volume:   46.10 ml  21.84 ml/m LA Vol (A4C):   65.1 ml 30.84 ml/m LA Biplane Vol: 68.0 ml 32.22 ml/m AORTIC VALVE LVOT Vmax:   125.00 cm/s LVOT Vmean:  86.000 cm/s LVOT VTI:    0.292 m  AORTA Ao Root diam: 3.20 cm Ao Asc diam:  3.20 cm Ao Desc diam: 2.30 cm  MITRAL VALVE                TRICUSPID VALVE MV Area (PHT): 2.97 cm     TR Peak grad:   28.3 mmHg MV Decel Time: 255 msec     TR Vmax:        266.00 cm/s MV E velocity: 89.50 cm/s MV A velocity: 108.00 cm/s  SHUNTS MV E/A ratio:  0.83         Systemic VTI:  0.29 m Systemic Diam: 2.00 cm  Gypsy Balsam MD Electronically signed by Gypsy Balsam MD Signature Date/Time: 02/05/2022/12:22:38 PM    Final     CT SCANS  CT CORONARY MORPH W/CTA COR W/SCORE 02/28/2022  Addendum 02/28/2022 12:58 PM ADDENDUM REPORT: 02/28/2022 12:56  CLINICAL DATA:  42F with chest pain  EXAM: Cardiac/Coronary CTA  TECHNIQUE: The patient was scanned on a [employer].  FINDINGS: A 100 kV prospective scan was triggered in the descending thoracic aorta at 111 HU's. Axial non-contrast 3 mm slices were carried out through the heart. The data set was analyzed on a dedicated work station and scored using the Agatson method. Gantry rotation speed was 250 msecs and collimation was .6 mm. No beta blockade and 0.8 mg of sl NTG was given. The 3D data set was reconstructed in 5% intervals of  the 35-75% of the R-R cycle. Phases were analyzed on a dedicated work station using MPR, MIP and VRT modes. The patient received 100 cc of contrast.  Coronary Arteries:  Normal coronary origin.  Right dominance.  RCA is a large dominant artery that gives rise to PDA and PLA. Calcified plaque in the proximal RCA causes 25-49% stenosis. Calcified plaque in the mid RCA causes 25-49% stenosis. Calcified plaque in the distal  RCA causes 25-49% stenosis  Left main is a large artery that gives rise to LAD and LCX arteries.  LAD is a large vessel. Calcified plaque in the proximal LAD causes 25-49% stenosis  LCX is a non-dominant artery that gives rise to one large OM1 branch. Mixed plaque in the proximal LCX causes 25-49% stenosis. High risk plaque features including napkin ring sign, low attenuation plaque, and positive remodeling. Calcified plaque in OM1 causes 25-49% stenosis  Other findings:  Left Ventricle: Normal size  Left Atrium: Moderate enlargement  Pulmonary Veins: Normal configuration  Right Ventricle: Normal size  Right Atrium: Normal size  Thoracic aorta: Normal size  Pulmonary Arteries: Normal size  Systemic Veins: Normal drainage  Pericardium: Normal thickness  IMPRESSION: 1. Coronary calcium score of 1391. No percentile available given age greater than 95  2. Normal coronary origin with right dominance.  3. Poor quality study due to noise secondary to obesity, severe coronary calcifications, and patient had PVC during acquisition. However, no obstructive CAD seen  4. Mixed plaque in the proximal LCX causes mild (25-49%) stenosis. High risk plaque features including napkin ring sign, low attenuation plaque, and positive remodeling  5. Calcified plaque causes mild (25-49%) stenosis in proximal/mid/distal RCA, proximal LAD, and OM1  CAD-RADS 2. Mild non-obstructive CAD (25-49%). Consider non-atherosclerotic causes of chest pain. Consider  preventive therapy and risk factor modification.   Electronically Signed By: Epifanio Lesches M.D. On: 02/28/2022 12:56  Narrative EXAM: OVER-READ INTERPRETATION  CT CHEST  The following report is a limited chest CT over-read performed by radiologist Dr. Donzetta Kohut of [facility] on 02/28/2022. This over-read does not include interpretation of cardiac or coronary anatomy or pathology. The coronary calcium and coronary CT angiography interpretation by the cardiologist is attached.  COMPARISON:  February 26, 2022  FINDINGS: Vascular: Please see dedicated report for cardiovascular details. There is aortic atherosclerosis and calcified coronary artery disease.  Mediastinum/Nodes: No adenopathy or acute process in the mediastinum.  Lungs/Pleura: Peri fissural nodule in the RIGHT mid chest is unchanged, this is found along the minor fissure and is triangular on coronal images, likely a small intrapulmonary lymph node for which no additional dedicated imaging follow-up is advised. No pleural effusion. No consolidative changes. Mild LEFT juxta diaphragmatic atelectatic changes.  Upper Abdomen: Incidental imaging of upper abdominal contents without definite acute findings. There are vague areas of peripheral low attenuation in the spleen but phase is quite early in terms of enhancement of the spleen. Some of these areas may be wedge-shaped however.  Musculoskeletal: No acute bone finding. No destructive bone process. Spinal degenerative changes.  IMPRESSION: 1. There are vague areas of peripheral low attenuation in the spleen but phase is quite early in terms of enhancement of the spleen. Given vaguely wedge-shaped appearance of some of these areas the possibility of splenic infarcts are considered though this may simply be related to bolus timing and phase of enhancement of the spleen. If there are LEFT upper quadrant abdominal symptoms could consider  dedicated abdominal imaging as warranted. 2. Aortic atherosclerosis and calcified coronary artery disease.  Aortic Atherosclerosis (ICD10-I70.0).  Electronically Signed: By: Donzetta Kohut M.D. On: 02/28/2022 11:14   CT SCANS  CT CORONARY MORPH W/CTA COR W/SCORE 10/12/2018  Addendum 10/12/2018 11:04 PM ADDENDUM REPORT: 10/12/2018 23:02  CLINICAL DATA:  89 year old female with chest pain.  EXAM: Cardiac/Coronary  CT  TECHNIQUE: The patient was scanned on a [employer].  FINDINGS: A 120 kV prospective scan was triggered  in the descending thoracic aorta at 111 HU's. Axial non-contrast 3 mm slices were carried out through the heart. The data set was analyzed on a dedicated work station and scored using the Agatson method. Gantry rotation speed was 250 msecs and collimation was .6 mm. No beta blockade and 0.8 mg of sl NTG was given. The 3D data set was reconstructed in 5% intervals of the 67-82 % of the R-R cycle. Diastolic phases were analyzed on a dedicated work station using MPR, MIP and VRT modes. The patient received 80 cc of contrast.  Aorta: Normal size. Mild diffuse atherosclerotic plaque and calcifications. No dissection.  Aortic Valve:  Trileaflet.  No calcifications.  Coronary Arteries:  Normal coronary origin.  Right dominance.  RCA is a very large dominant artery that gives rise to PDA and PLA. There is mild diffuse calcified plaque throughout the entire RCA with stenosis 25-49%.  Left main is a large artery that gives rise to LAD and LCX arteries. Left main has no plaque.  LAD is a large vessel that gives rise to two small diagonal arteries. Proximal and mid LAD has a mild calcified plaque with stenosis 25-49%.  LCX is a non-dominant artery that gives rise to one OM1 branch. There is moderate mixed plaque in the mid LCX artery with stenosis 50-69%.  Other findings:  Normal pulmonary vein drainage into the left atrium.  Normal let atrial  appendage without a thrombus.  Normal size of the pulmonary artery.  IMPRESSION: 1. Coronary calcium score of 1190. This was 95 percentile for age and sex matched control.  2. Normal coronary origin with right dominance.  3. Mild diffuse CAD in the entire RCA, proximal and mid LAD and moderate CAD in the mid LCX artery. Additional analysis with CT FFR will be submitted.   Electronically Signed By: Tobias Alexander On: 10/12/2018 23:02  Narrative EXAM: OVER-READ INTERPRETATION CT CHEST  The following report is an over-read performed by radiologist Dr. Hulan Saas of [facility] on 10/11/2018. This over-read does not include interpretation of cardiac or coronary anatomy or pathology. The coronary calcium score/coronary CTA interpretation by the cardiologist is attached.  COMPARISON:  CTA chest for pulmonary embolism 09/08/2018.  FINDINGS: Vascular: Mild to moderate atherosclerosis involving the descending thoracic aorta. No evidence of aortic aneurysm.  Mediastinum/Nodes: No pathologic lymphadenopathy within the visualized mediastinum. Visualized esophagus normal in appearance.  Lungs/Pleura: Likely benign 4 mm subpleural nodule in the anterolateral RIGHT UPPER LOBE (series 13, image 14), identified on the CT 1 month ago and unchanged in that short interval. Visualized lung parenchyma otherwise clear. No pleural effusions. Central airways patent without significant bronchial wall thickening.  Upper Abdomen: Unremarkable for the early arterial phase of enhancement.  Musculoskeletal: Degenerative changes and DISH involving the visualized LOWER thoracic spine.  IMPRESSION: 1. Likely benign 4 mm subpleural nodule in the anterolateral RIGHT UPPER LOBE, identified on the CT 1 month ago and unchanged in that short interval. Follow-up recommendation was present on that report and is repeated here: No follow-up needed if patient is low-risk. Non-contrast  chest CT can be considered in 12 months if patient is high-risk. This recommendation follows the consensus statement: Guidelines for Management of Incidental Pulmonary Nodules Detected on CT Images: From the Fleischner Society 2017; Radiology 2017; 284:228-243. 2.  Aortic Atherosclerosis (ICD10-170.0) 3. No significant extracardiac findings otherwise.  Electronically Signed: By: Hulan Saas M.D. On: 10/11/2018 15:34           EKG:  EKG  is  ordered today.  The ekg ordered today demonstrates sinus bradycardia with first-degree AV block, heart rate 59 bpm, consistent with prior EKG tracings.  Recent Labs: 02/26/2022: B Natriuretic Peptide 59.7 02/27/2022: ALT 20; TSH 0.262 02/28/2022: Hemoglobin 11.4; Magnesium 2.2; Platelets 230 04/22/2022: BUN 25; Creatinine, Ser 0.88; NT-Pro BNP 465; Potassium 4.1; Sodium 142  Recent Lipid Panel    Component Value Date/Time   CHOL 155 02/27/2022 0100   CHOL 134 10/22/2018 0952   TRIG 142 02/27/2022 0100   HDL 39 (L) 02/27/2022 0100   HDL 40 10/22/2018 0952   CHOLHDL 4.0 02/27/2022 0100   VLDL 28 02/27/2022 0100   LDLCALC 88 02/27/2022 0100   LDLCALC 67 10/22/2018 0952     Risk Assessment/Calculations:    CHA2DS2-VASc Score = 7   This indicates a 11.2% annual risk of stroke. The patient's score is based upon: CHF History: 1 HTN History: 1 Diabetes History: 1 Stroke History: 0 Vascular Disease History: 1 Age Score: 2 Gender Score: 1           Physical Exam:    VS:  BP (!) 153/74 (BP Location: Left Arm, Patient Position: Sitting, Cuff Size: Normal)   Pulse (!) 59   Ht 5\' 3"  (1.6 m)   Wt 242 lb (109.8 kg)   SpO2 91%   BMI 42.87 kg/m     Wt Readings from Last 3 Encounters:  08/21/22 242 lb (109.8 kg)  04/22/22 245 lb 3.2 oz (111.2 kg)  03/13/22 245 lb 9.6 oz (111.4 kg)     GEN: Appears younger than stated age,  Well nourished, well developed in no acute distress HEENT: Normal NECK: No JVD; No carotid  bruits LYMPHATICS: No lymphadenopathy CARDIAC: RRR, no murmurs, rubs, gallops RESPIRATORY:  Clear to auscultation without rales, wheezing or rhonchi  ABDOMEN: Soft, non-tender, non-distended MUSCULOSKELETAL:  +3 pitting edema; No deformity  SKIN: Warm and dry NEUROLOGIC:  Alert and oriented x 3 PSYCHIATRIC:  Normal affect   ASSESSMENT:    1. Paroxysmal atrial fibrillation (HCC)   2. Chronic diastolic congestive heart failure (HCC)   3. Coronary artery disease involving native coronary artery of native heart without angina pectoris   4. Hyperlipidemia LDL goal <70   5. Hypercoagulable state (HCC)   6. On amiodarone therapy   7. Elevated blood pressure reading    PLAN:    In order of problems listed above:  Paroxysmal atrial fibrillation/hypercoagulable state/on amiodarone therapy-today she is in sinus rhythm, CHA2DS2-VASc score of 7, continue amiodarone 200 mg daily, continue Eliquis 5 mg twice daily--no indication for dose reduction, was previously on metoprolol however this was discontinued secondary to bradycardia.  Regarding her amiodarone therapy, recent TSH was WNL, LFTs WNL, she will see pulmonology at the end of the year, had recent eye evaluation which might have indicated macular degeneration, she will see them again in 6 months.  Will repeat BMET, CBC today. Chronic congestive heart failure-NYHA class I-II, hard to decipher if this is related to her heart failure or her lung disease, + pedal edema, lung sounds are clear.  Continue Lasix 60 mg daily, spironolactone 12.5 mg daily.  Beta-blocker was discontinued secondary to bradycardia.  Will discuss SGLT2 at next OV, although body habitus may be prohibitive. Elevated blood pressure reading-blood pressure today was elevated in the office at 153/74 however her daughter states she ate a particularly salty meal previously.  Review of blood pressures available in epic reveal that they are largely controlled.  Will not  start her on any  antihypertensives at this time however her daughter accompanies her on all of her doctors visits and advised her if it appears elevated at her next visit to reach out and let us know.  Could consider starting amlodipine 2.5 mg daily. CAD -LHC in 2014 revealed 40% RCA stenosis, Stable with no anginal symptoms. No indication for ischemic evaluation.  Continue nitroglycerin as needed--has not needed, continue Crestor 40 mg daily, was previously on metoprolol however this was discontinued secondary to bradycardia.  Hyperlipidemia -most recent LDL was elevated 88, continue Crestor 40 mg daily.  Disposition-CBC, CMET.  Return in 4 months.          Medication Adjustments/Labs and Tests Ordered: Current medicines are reviewed at length with the patient today.  Concerns regarding medicines are outlined above.  Orders Placed This Encounter  Procedures   Basic metabolic panel   CBC with Differential/Platelet   EKG 12-Lead   No orders of the defined types were placed in this encounter.   Patient Instructions  Medication Instructions:  Your physician recommends that you continue on your current medications as directed. Please refer to the Current Medication list given to you today.  *If you need a refill on your cardiac medications before your next appointment, please call your pharmacy*   Lab Work: Your physician recommends that you return for lab work in: Today for a BMP and CBC  If you have labs (blood work) drawn today and your tests are completely normal, you will receive your results only by: MyChart Message (if you have MyChart) OR A paper copy in the mail If you have any lab test that is abnormal or we need to change your treatment, we will call you to review the results.   Testing/Procedures: NONE   Follow-Up: At [facility], you and your health needs are our priority.  As part of our continuing mission to provide you with exceptional heart care, we have created  designated Provider Care Teams.  These Care Teams include your primary Cardiologist (physician) and Advanced Practice Providers (APPs -  Physician Assistants and Nurse Practitioners) who all work together to provide you with the care you need, when you need it.  We recommend signing up for the patient portal called "MyChart".  Sign up information is provided on this After Visit Summary.  MyChart is used to connect with patients for Virtual Visits (Telemedicine).  Patients are able to view lab/test results, encounter notes, upcoming appointments, etc.  Non-urgent messages can be sent to your provider as well.   To learn more about what you can do with MyChart, go to [email or web address].    Your next appointment:   4 month(s)  Provider:   Gypsy Balsam, MD    Other Instructions     Signed, Flossie Dibble, NP  08/21/2022 3:56 PM    [facility]

## 2022-08-21 ENCOUNTER — Ambulatory Visit: Payer: PPO | Attending: Cardiology | Admitting: Cardiology

## 2022-08-21 ENCOUNTER — Encounter: Payer: Self-pay | Admitting: Cardiology

## 2022-08-21 VITALS — BP 153/74 | HR 59 | Ht 63.0 in | Wt 242.0 lb

## 2022-08-21 DIAGNOSIS — E785 Hyperlipidemia, unspecified: Secondary | ICD-10-CM | POA: Diagnosis not present

## 2022-08-21 DIAGNOSIS — I480 Paroxysmal atrial fibrillation: Secondary | ICD-10-CM | POA: Diagnosis not present

## 2022-08-21 DIAGNOSIS — I5032 Chronic diastolic (congestive) heart failure: Secondary | ICD-10-CM

## 2022-08-21 DIAGNOSIS — Z79899 Other long term (current) drug therapy: Secondary | ICD-10-CM

## 2022-08-21 DIAGNOSIS — I2510 Atherosclerotic heart disease of native coronary artery without angina pectoris: Secondary | ICD-10-CM

## 2022-08-21 DIAGNOSIS — R030 Elevated blood-pressure reading, without diagnosis of hypertension: Secondary | ICD-10-CM

## 2022-08-21 DIAGNOSIS — D6859 Other primary thrombophilia: Secondary | ICD-10-CM

## 2022-08-21 NOTE — Patient Instructions (Signed)
Medication Instructions:  Your physician recommends that you continue on your current medications as directed. Please refer to the Current Medication list given to you today.  *If you need a refill on your cardiac medications before your next appointment, please call your pharmacy*   Lab Work: Your physician recommends that you return for lab work in: Today for a BMP and CBC  If you have labs (blood work) drawn today and your tests are completely normal, you will receive your results only by: MyChart Message (if you have MyChart) OR A paper copy in the mail If you have any lab test that is abnormal or we need to change your treatment, we will call you to review the results.   Testing/Procedures: NONE   Follow-Up: At [facility], you and your health needs are our priority.  As part of our continuing mission to provide you with exceptional heart care, we have created designated Provider Care Teams.  These Care Teams include your primary Cardiologist (physician) and Advanced Practice Providers (APPs -  Physician Assistants and Nurse Practitioners) who all work together to provide you with the care you need, when you need it.  We recommend signing up for the patient portal called "MyChart".  Sign up information is provided on this After Visit Summary.  MyChart is used to connect with patients for Virtual Visits (Telemedicine).  Patients are able to view lab/test results, encounter notes, upcoming appointments, etc.  Non-urgent messages can be sent to your provider as well.   To learn more about what you can do with MyChart, go to [email or web address].    Your next appointment:   4 month(s)  Provider:   Gypsy Balsam, MD    Other Instructions

## 2022-08-22 ENCOUNTER — Telehealth: Payer: Self-pay

## 2022-08-22 LAB — BASIC METABOLIC PANEL WITH GFR
BUN/Creatinine Ratio: 29 — ABNORMAL HIGH (ref 12–28)
BUN: 28 mg/dL — ABNORMAL HIGH (ref 8–27)
CO2: 25 mmol/L (ref 20–29)
Calcium: 9.6 mg/dL (ref 8.7–10.3)
Chloride: 102 mmol/L (ref 96–106)
Creatinine, Ser: 0.98 mg/dL (ref 0.57–1.00)
Glucose: 120 mg/dL — ABNORMAL HIGH (ref 70–99)
Potassium: 4.4 mmol/L (ref 3.5–5.2)
Sodium: 143 mmol/L (ref 134–144)
eGFR: 56 mL/min/1.73 — ABNORMAL LOW

## 2022-08-22 LAB — CBC WITH DIFFERENTIAL/PLATELET
Basophils Absolute: 0.1 x10E3/uL (ref 0.0–0.2)
Basos: 1 %
EOS (ABSOLUTE): 0.2 x10E3/uL (ref 0.0–0.4)
Eos: 3 %
Hematocrit: 38.3 % (ref 34.0–46.6)
Hemoglobin: 12.6 g/dL (ref 11.1–15.9)
Immature Grans (Abs): 0 x10E3/uL (ref 0.0–0.1)
Immature Granulocytes: 0 %
Lymphocytes Absolute: 2.4 x10E3/uL (ref 0.7–3.1)
Lymphs: 32 %
MCH: 30.5 pg (ref 26.6–33.0)
MCHC: 32.9 g/dL (ref 31.5–35.7)
MCV: 93 fL (ref 79–97)
Monocytes Absolute: 0.7 x10E3/uL (ref 0.1–0.9)
Monocytes: 9 %
Neutrophils Absolute: 4 x10E3/uL (ref 1.4–7.0)
Neutrophils: 55 %
Platelets: 227 x10E3/uL (ref 150–450)
RBC: 4.13 x10E6/uL (ref 3.77–5.28)
RDW: 14.3 % (ref 11.7–15.4)
WBC: 7.4 x10E3/uL (ref 3.4–10.8)

## 2022-08-22 NOTE — Telephone Encounter (Signed)
Patient notified through my chart.

## 2022-08-22 NOTE — Telephone Encounter (Signed)
-----   Message from Flossie Dibble, NP sent at 08/22/2022  9:53 AM EDT ----- Ms. Senaida Ores, Your blood work showed stable kidney function, no signs of anemia or infection. Good results! [employer], [employer]

## 2022-09-04 ENCOUNTER — Other Ambulatory Visit (HOSPITAL_BASED_OUTPATIENT_CLINIC_OR_DEPARTMENT_OTHER): Payer: Self-pay | Admitting: Family

## 2022-09-04 DIAGNOSIS — I480 Paroxysmal atrial fibrillation: Secondary | ICD-10-CM

## 2022-09-05 NOTE — Telephone Encounter (Signed)
Patient of Dr. Krasowski. Please review for refill. Thank you!  

## 2022-09-10 DIAGNOSIS — I739 Peripheral vascular disease, unspecified: Secondary | ICD-10-CM | POA: Insufficient documentation

## 2022-09-10 DIAGNOSIS — G629 Polyneuropathy, unspecified: Secondary | ICD-10-CM | POA: Insufficient documentation

## 2022-09-11 ENCOUNTER — Ambulatory Visit: Payer: PPO | Admitting: Podiatry

## 2022-10-01 ENCOUNTER — Ambulatory Visit: Payer: PPO | Admitting: Podiatry

## 2022-10-15 ENCOUNTER — Ambulatory Visit: Payer: PPO | Admitting: Podiatry

## 2022-10-23 ENCOUNTER — Ambulatory Visit: Payer: PPO | Admitting: Podiatry

## 2022-10-28 ENCOUNTER — Ambulatory Visit: Payer: PPO | Admitting: Podiatry

## 2022-10-28 DIAGNOSIS — E1140 Type 2 diabetes mellitus with diabetic neuropathy, unspecified: Secondary | ICD-10-CM | POA: Diagnosis not present

## 2022-10-28 DIAGNOSIS — M79675 Pain in left toe(s): Secondary | ICD-10-CM

## 2022-10-28 DIAGNOSIS — L84 Corns and callosities: Secondary | ICD-10-CM

## 2022-10-28 DIAGNOSIS — M79674 Pain in right toe(s): Secondary | ICD-10-CM | POA: Diagnosis not present

## 2022-10-28 DIAGNOSIS — B351 Tinea unguium: Secondary | ICD-10-CM

## 2022-10-28 DIAGNOSIS — I739 Peripheral vascular disease, unspecified: Secondary | ICD-10-CM

## 2022-10-28 NOTE — Progress Notes (Signed)
  Subjective:  Patient ID: Danielle [name], female    DOB: Aug 09, 1933,  MRN: [id number]  Chief Complaint  Patient presents with   Nail Problem    Diabetic Foot Care-nail trim     89 y.o. female presents with the above complaint. History confirmed with patient. Patient presenting with pain related to dystrophic thickened elongated nails. Patient is unable to trim own nails related to nail dystrophy and/or mobility issues. Patient does does have a history of T2DM. Patient does/does not have callus present located at the medial right forefoot causing pain.   Objective:  Physical Exam: warm, good capillary refill nail exam onychomycosis of the toenails, onycholysis, and dystrophic nails DP pulses palpable, PT pulses palpable, and protective sensation intact Left Foot:  Pain with palpation of nails due to elongation and dystrophic growth.  Right Foot: Pain with palpation of nails due to elongation and dystrophic growth. Hyperkeratotic lesion sub 1st met head and medial hallux IPJ on the right foot  Assessment:   1. Pain due to onychomycosis of toenails of both feet   2. Corns and callosities   3. PVD (peripheral vascular disease) (HCC)   4. Type 2 diabetes mellitus with diabetic neuropathy, unspecified whether long term insulin use (HCC)      Plan:  Patient was evaluated and treated and all questions answered.  #Hyperkeratotic lesions/pre ulcerative calluses present sub 1st mpj and medial hallux right foot All symptomatic hyperkeratoses x 2 separate lesions were safely debrided with a sterile #10 blade to patient's level of comfort without incident. We discussed preventative and palliative care of these lesions including supportive and accommodative shoegear, padding, prefabricated and custom molded accommodative orthoses, use of a pumice stone and lotions/creams daily.  #Onychomycosis with pain  -Nails palliatively debrided as below. -Educated on self-care  Procedure: Nail  Debridement Rationale: Pain Type of Debridement: manual, sharp debridement. Instrumentation: Nail nipper, rotary burr. Number of Nails: 10  Return in about 3 months (around 01/28/2023) for [facility].         Corinna Gab, DPM [facility] / [facility]

## 2022-12-03 ENCOUNTER — Other Ambulatory Visit (HOSPITAL_BASED_OUTPATIENT_CLINIC_OR_DEPARTMENT_OTHER): Payer: Self-pay | Admitting: Cardiology

## 2022-12-03 DIAGNOSIS — I480 Paroxysmal atrial fibrillation: Secondary | ICD-10-CM

## 2022-12-23 ENCOUNTER — Encounter: Payer: Self-pay | Admitting: Cardiology

## 2022-12-23 ENCOUNTER — Ambulatory Visit: Payer: PPO | Attending: Cardiology | Admitting: Cardiology

## 2022-12-23 VITALS — BP 118/66 | HR 71 | Ht 63.5 in | Wt 241.8 lb

## 2022-12-23 DIAGNOSIS — G4733 Obstructive sleep apnea (adult) (pediatric): Secondary | ICD-10-CM

## 2022-12-23 DIAGNOSIS — I2510 Atherosclerotic heart disease of native coronary artery without angina pectoris: Secondary | ICD-10-CM | POA: Diagnosis not present

## 2022-12-23 DIAGNOSIS — J431 Panlobular emphysema: Secondary | ICD-10-CM

## 2022-12-23 DIAGNOSIS — I480 Paroxysmal atrial fibrillation: Secondary | ICD-10-CM

## 2022-12-23 NOTE — Patient Instructions (Signed)
Medication Instructions:  Your physician recommends that you continue on your current medications as directed. Please refer to the Current Medication list given to you today.  *If you need a refill on your cardiac medications before your next appointment, please call your pharmacy*   Lab Work: Your physician recommends that you return for lab work in: Pro BNP and BMP today. If you have labs (blood work) drawn today and your tests are completely normal, you will receive your results only by: MyChart Message (if you have MyChart) OR A paper copy in the mail If you have any lab test that is abnormal or we need to change your treatment, we will call you to review the results.   Testing/Procedures: None   Follow-Up: At [facility], you and your health needs are our priority.  As part of our continuing mission to provide you with exceptional heart care, we have created designated Provider Care Teams.  These Care Teams include your primary Cardiologist (physician) and Advanced Practice Providers (APPs -  Physician Assistants and Nurse Practitioners) who all work together to provide you with the care you need, when you need it.  We recommend signing up for the patient portal called "MyChart".  Sign up information is provided on this After Visit Summary.  MyChart is used to connect with patients for Virtual Visits (Telemedicine).  Patients are able to view lab/test results, encounter notes, upcoming appointments, etc.  Non-urgent messages can be sent to your provider as well.   To learn more about what you can do with MyChart, go to [email or web address].    Your next appointment:   6 month(s)  Provider:   Gypsy Balsam, MD    Other Instructions

## 2022-12-23 NOTE — Progress Notes (Unsigned)
Cardiology Office Note:    Date:  12/23/2022   ID:  Danielle [name], DOB [birth year], MRN [id number]  PCP:  Danielle Payment., MD  Cardiologist:  Danielle Balsam, MD    Referring MD: Danielle Payment., MD   Chief Complaint  Patient presents with   Leg Swelling    History of Present Illness:    Danielle [name] is a 89 y.o. female with past medical history significant for nonobstructive coronary artery disease, disproven by coronary CT angio, in February 28, 2022 coronary stent has been performed calcium score is high 1391 but FFR showed nonobstructive disease.  Additional problem include paroxysmal atrial fibrillation, she is suppressed with amiodarone rhythm control strategy, anticoagulated.  Also diastolic congestive heart failure, morbid obesity, Comes today to months for follow-up overall she looks good and still complain of tightness and swelling of lower extremities however examination today revealed only mild swelling.  She takes Aldactone as well as furosemide and asking if he can switch those medications.   Past Medical History:  Diagnosis Date   Anginal pain (HCC)    Arthritis    Asthma    At high risk for falls 11/11/2017   Chest pain 11/09/2015   CHF (congestive heart failure) (HCC)    Chronic diastolic congestive heart failure (HCC) 11/11/2015   Chronic insomnia 10/24/2015   Chronic pain of both knees 05/14/2018   Chronic respiratory failure with hypoxia (HCC) 05/05/2017   CKD (chronic kidney disease) stage 3, GFR 30-59 ml/min (HCC) 05/07/2017   COPD (chronic obstructive pulmonary disease) (HCC)    Coronary artery disease    Coronary artery disease involving native coronary artery of native heart without angina pectoris 11/14/2014   Overview:  40% RCA a cardiac catheter in 2014   Current mild episode of major depressive disorder (HCC) 11/14/2014   Depression    DOE (dyspnea on exertion) 09/07/2018   Dysphagia 08/12/2018   Edema 08/12/2018   Elevated blood  sugar 11/09/2015   Emphysema lung (HCC)    Flat feet, bilateral 05/30/2020   Gastroesophageal reflux disease without esophagitis 11/11/2017   GERD (gastroesophageal reflux disease)    Headache    History of hiatal hernia    Hyperlipidemia    Hypertension    Insomnia    Internal hemorrhoids 11/26/2018   Iron deficiency anemia 08/12/2018   Malaise and fatigue 08/14/2015   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/26/2018   Mesenteric mass 02/19/2015   Midsternal chest pain 11/11/2015   Mixed hyperlipidemia 11/14/2014   Morbid obesity (HCC) 09/07/2018   OSA (obstructive sleep apnea) 12/16/2019   Osteoporosis    Panlobular emphysema (HCC) 11/09/2015   Pneumonia 2013   PONV (postoperative nausea and vomiting)    Positive D-dimer 09/08/2018   Primary osteoarthritis involving multiple joints 08/14/2015   Restless legs syndrome 03/04/2018   Senile purpura (HCC) 08/12/2018   Shortness of breath dyspnea    with exertion   Stasis edema of left lower extremity 04/13/2018   Strain of right shoulder 08/12/2018   Type 2 diabetes mellitus with stage 3 chronic kidney disease (HCC) 08/14/2015   Unstable angina (HCC) 10/30/2018   Vitamin B12 deficiency 04/13/2018   Vitamin D deficiency 02/27/2016    Past Surgical History:  Procedure Laterality Date   ABDOMINAL HYSTERECTOMY     APPENDECTOMY     BREAST SURGERY  40 yrs. ago   growth removed in each breast-benign   CARDIAC CATHETERIZATION     12/14/12 LHC ([facility]): few mild stenotic areas max 40% of  ectatic RCA o/w NL coronaries, EF 65%. Med tx.   CATARACT EXTRACTION W/ INTRAOCULAR LENS  IMPLANT, BILATERAL Bilateral 15 yrs ago   CHOLECYSTECTOMY     COLONOSCOPY  04/22/2013   Diverticulosis in the sigmoid colon. Non bleeding internal hemorrhoids. Normal mucosa vascular pattern in the entire examined colon. Three 6 mm polyps n the descending colon. Resected and retrieved.    FOOT SURGERY     LAPAROSCOPIC APPENDECTOMY N/A 02/19/2015   Procedure: EXCISION OF MESENTERIC  MASS;  Surgeon: Almond Lint, MD;  Location: [facility] OR;  Service: General;  Laterality: N/A;   REPLACEMENT TOTAL KNEE BILATERAL      Current Medications: Current Meds  Medication Sig   albuterol (PROVENTIL HFA;VENTOLIN HFA) 108 (90 BASE) MCG/ACT inhaler Inhale 2 puffs into the lungs every 6 (six) hours as needed for wheezing or shortness of breath.    amiodarone (PACERONE) 200 MG tablet Take 1 tablet by mouth once daily   apixaban (ELIQUIS) 5 MG TABS tablet Take 1 tablet (5 mg total) by mouth 2 (two) times daily.   budesonide-formoterol (SYMBICORT) 160-4.5 MCG/ACT inhaler Inhale 2 puffs into the lungs 2 (two) times daily.   citalopram (CELEXA) 20 MG tablet Take 20 mg by mouth daily.   clonazePAM (KLONOPIN) 1 MG tablet Take 1 mg by mouth as needed for anxiety (sleep).   diclofenac Sodium (VOLTAREN) 1 % GEL APPLY 2 GRAMS TOPICALLY TO AFFECTED AREA THREE TIMES DAILY (Patient taking differently: Apply 2 g topically daily as needed (joint pain).)   doxycycline (DORYX) 100 MG EC tablet Take 100 mg by mouth 2 (two) times daily. Last dose 12/25/2022   ferrous sulfate 325 (65 FE) MG EC tablet Take 325 mg by mouth daily with breakfast.   furosemide (LASIX) 20 MG tablet Take 3 tablets (60 mg total) by mouth daily.   gabapentin (NEURONTIN) 100 MG capsule Take 100 mg by mouth at bedtime.   glimepiride (AMARYL) 4 MG tablet Take 4 mg by mouth daily.   metFORMIN (GLUCOPHAGE) 500 MG tablet Take 500 mg by mouth 2 (two) times daily.   Multiple Vitamin (MULTIVITAMIN) capsule Take 1 capsule by mouth daily.   nitroGLYCERIN (NITROSTAT) 0.4 MG SL tablet Place 1 tablet (0.4 mg total) under the tongue every 5 (five) minutes as needed for chest pain.   nystatin (MYCOSTATIN/NYSTOP) powder Apply 1 Application topically 2 (two) times daily as needed (yeast).   pantoprazole (PROTONIX) 40 MG tablet Take 40 mg by mouth 2 (two) times daily.   potassium chloride (MICRO-K) 10 MEQ CR capsule Take 10 mEq by mouth daily.   rOPINIRole  (REQUIP) 0.25 MG tablet Take 0.25 mg by mouth at bedtime.   rosuvastatin (CRESTOR) 40 MG tablet Take 40 mg by mouth daily.   sitaGLIPtin (JANUVIA) 100 MG tablet Take 100 mg by mouth daily.   spironolactone (ALDACTONE) 25 MG tablet Take 0.5 tablets (12.5 mg total) by mouth daily.   Vitamin D, Ergocalciferol, (DRISDOL) 50000 UNITS CAPS capsule Take 50,000 Units by mouth every Sunday. No specific days   [DISCONTINUED] OVER THE COUNTER MEDICATION Take 1 tablet by mouth at bedtime. Nurvive for restless leg     Allergies:   Codeine, Levofloxacin, Penicillins, and Tape   Social History   Socioeconomic History   Marital status: Married    Spouse name: Not on file   Number of children: Not on file   Years of education: Not on file   Highest education level: Not on file  Occupational History   Not on  file  Tobacco Use   Smoking status: Former    Current packs/day: 0.00    Types: Cigarettes    Quit date: 08/21/2016    Years since quitting: 6.3   Smokeless tobacco: Never  Vaping Use   Vaping status: Never Used  Substance and Sexual Activity   Alcohol use: No   Drug use: No   Sexual activity: Not on file  Other Topics Concern   Not on file  Social History Narrative   Not on file   Social Determinants of Health   Financial Resource Strain: Not on file  Food Insecurity: Low Risk  (10/22/2022)   Received from [facility]   Hunger Vital Sign    Worried About Running Out of Food in the Last Year: Never true    Ran Out of Food in the Last Year: Never true  Transportation Needs: Not on file (10/22/2022)  Physical Activity: Not on file  Stress: Not on file  Social Connections: Not on file     Family History: The patient's family history includes Heart attack in her mother. ROS:   Please see the history of present illness.    All 14 point review of systems negative except as described per history of present illness  EKGs/Labs/Other Studies Reviewed:    EKG  Interpretation Date/Time:  Tuesday December 23 2022 15:31:14 EDT Ventricular Rate:  71 PR Interval:  234 QRS Duration:  102 QT Interval:  428 QTC Calculation: 465 R Axis:   -42  Text Interpretation: Sinus rhythm with sinus arrhythmia with 1st degree A-V block Left axis deviation Pulmonary disease pattern Abnormal ECG When compared with ECG of 26-Feb-2022 19:43, PREVIOUS ECG IS PRESENT Confirmed by Danielle [name] [phone]) on 12/23/2022 3:36:09 PM    Recent Labs: 02/26/2022: B Natriuretic Peptide 59.7 02/27/2022: ALT 20; TSH 0.262 02/28/2022: Magnesium 2.2 04/22/2022: NT-Pro BNP 465 08/21/2022: BUN 28; Creatinine, Ser 0.98; Hemoglobin 12.6; Platelets 227; Potassium 4.4; Sodium 143  Recent Lipid Panel    Component Value Date/Time   CHOL 155 02/27/2022 0100   CHOL 134 10/22/2018 0952   TRIG 142 02/27/2022 0100   HDL 39 (L) 02/27/2022 0100   HDL 40 10/22/2018 0952   CHOLHDL 4.0 02/27/2022 0100   VLDL 28 02/27/2022 0100   LDLCALC 88 02/27/2022 0100   LDLCALC 67 10/22/2018 0952    Physical Exam:    VS:  BP 118/66 (BP Location: Left Arm, Patient Position: Sitting)   Pulse 71   Ht 5' 3.5" (1.613 m)   Wt 241 lb 12.8 oz (109.7 kg)   SpO2 96%   BMI 42.16 kg/m     Wt Readings from Last 3 Encounters:  12/23/22 241 lb 12.8 oz (109.7 kg)  08/21/22 242 lb (109.8 kg)  04/22/22 245 lb 3.2 oz (111.2 kg)     GEN:  Well nourished, well developed in no acute distress HEENT: Normal NECK: No JVD; No carotid bruits LYMPHATICS: No lymphadenopathy CARDIAC: RRR, no murmurs, no rubs, no gallops RESPIRATORY:  Clear to auscultation without rales, wheezing or rhonchi  ABDOMEN: Soft, non-tender, non-distended MUSCULOSKELETAL:  No edema; No deformity  SKIN: Warm and dry LOWER EXTREMITIES: no swelling NEUROLOGIC:  Alert and oriented x 3 PSYCHIATRIC:  Normal affect   ASSESSMENT:    1. Paroxysmal atrial fibrillation (HCC)   2. Coronary artery disease involving native coronary artery of  native heart without angina pectoris   3. Panlobular emphysema (HCC)   4. OSA (obstructive sleep apnea)    PLAN:  In order of problems listed above:  Paroxysmal atrial fibrillation: Rate controlled, anticoagulated with Eliquis which I will continue. Coronary disease stable denies of any signs and symptoms that would indicate reactivation of the problem. Obstructive sleep apnea followed by internal medicine team. Diastolic congestive heart failure she is compensated I will check proBNP as well as Chem-7 to see if we can switch her to torsemide   Medication Adjustments/Labs and Tests Ordered: Current medicines are reviewed at length with the patient today.  Concerns regarding medicines are outlined above.  Orders Placed This Encounter  Procedures   EKG 12-Lead   Medication changes: No orders of the defined types were placed in this encounter.   Signed, Georgeanna Lea, MD, [facility] 12/23/2022 3:50 PM    [facility]

## 2022-12-24 LAB — BASIC METABOLIC PANEL
BUN/Creatinine Ratio: 38 — ABNORMAL HIGH (ref 12–28)
BUN: 42 mg/dL — ABNORMAL HIGH (ref 8–27)
CO2: 24 mmol/L (ref 20–29)
Calcium: 10 mg/dL (ref 8.7–10.3)
Chloride: 98 mmol/L (ref 96–106)
Creatinine, Ser: 1.1 mg/dL — ABNORMAL HIGH (ref 0.57–1.00)
Glucose: 124 mg/dL — ABNORMAL HIGH (ref 70–99)
Potassium: 4.3 mmol/L (ref 3.5–5.2)
Sodium: 140 mmol/L (ref 134–144)
eGFR: 49 mL/min/{1.73_m2} — ABNORMAL LOW (ref >59)

## 2022-12-24 LAB — PRO B NATRIURETIC PEPTIDE: NT-Pro BNP: 76 pg/mL (ref 0–738)

## 2023-01-16 DIAGNOSIS — R0789 Other chest pain: Secondary | ICD-10-CM | POA: Diagnosis not present

## 2023-01-16 DIAGNOSIS — I480 Paroxysmal atrial fibrillation: Secondary | ICD-10-CM | POA: Diagnosis not present

## 2023-01-27 ENCOUNTER — Ambulatory Visit: Payer: PPO | Admitting: Podiatry

## 2023-02-09 ENCOUNTER — Ambulatory Visit: Payer: PPO | Admitting: Podiatry

## 2023-02-13 ENCOUNTER — Emergency Department (HOSPITAL_COMMUNITY): Payer: PPO

## 2023-02-13 ENCOUNTER — Encounter (HOSPITAL_COMMUNITY): Payer: Self-pay

## 2023-02-13 ENCOUNTER — Inpatient Hospital Stay (HOSPITAL_COMMUNITY)
Admission: EM | Admit: 2023-02-13 | Discharge: 2023-02-19 | DRG: 660 | Disposition: A | Discharge status: Home / Self Care | Payer: PPO | Attending: Internal Medicine | Admitting: Internal Medicine

## 2023-02-13 ENCOUNTER — Other Ambulatory Visit: Payer: Self-pay

## 2023-02-13 DIAGNOSIS — I10 Essential (primary) hypertension: Secondary | ICD-10-CM | POA: Diagnosis present

## 2023-02-13 DIAGNOSIS — Z87891 Personal history of nicotine dependence: Secondary | ICD-10-CM

## 2023-02-13 DIAGNOSIS — Z7951 Long term (current) use of inhaled steroids: Secondary | ICD-10-CM

## 2023-02-13 DIAGNOSIS — Z6841 Body Mass Index (BMI) 40.0 and over, adult: Secondary | ICD-10-CM

## 2023-02-13 DIAGNOSIS — Z1152 Encounter for screening for COVID-19: Secondary | ICD-10-CM

## 2023-02-13 DIAGNOSIS — N202 Calculus of kidney with calculus of ureter: Secondary | ICD-10-CM | POA: Diagnosis not present

## 2023-02-13 DIAGNOSIS — G2581 Restless legs syndrome: Secondary | ICD-10-CM | POA: Diagnosis present

## 2023-02-13 DIAGNOSIS — I480 Paroxysmal atrial fibrillation: Secondary | ICD-10-CM | POA: Diagnosis present

## 2023-02-13 DIAGNOSIS — Z96653 Presence of artificial knee joint, bilateral: Secondary | ICD-10-CM | POA: Diagnosis present

## 2023-02-13 DIAGNOSIS — G4736 Sleep related hypoventilation in conditions classified elsewhere: Secondary | ICD-10-CM | POA: Diagnosis present

## 2023-02-13 DIAGNOSIS — N390 Urinary tract infection, site not specified: Secondary | ICD-10-CM | POA: Diagnosis present

## 2023-02-13 DIAGNOSIS — I482 Chronic atrial fibrillation, unspecified: Secondary | ICD-10-CM | POA: Diagnosis present

## 2023-02-13 DIAGNOSIS — Z8249 Family history of ischemic heart disease and other diseases of the circulatory system: Secondary | ICD-10-CM

## 2023-02-13 DIAGNOSIS — E1122 Type 2 diabetes mellitus with diabetic chronic kidney disease: Secondary | ICD-10-CM | POA: Diagnosis present

## 2023-02-13 DIAGNOSIS — Z885 Allergy status to narcotic agent status: Secondary | ICD-10-CM

## 2023-02-13 DIAGNOSIS — K219 Gastro-esophageal reflux disease without esophagitis: Secondary | ICD-10-CM | POA: Diagnosis present

## 2023-02-13 DIAGNOSIS — G4733 Obstructive sleep apnea (adult) (pediatric): Secondary | ICD-10-CM | POA: Diagnosis present

## 2023-02-13 DIAGNOSIS — Z7984 Long term (current) use of oral hypoglycemic drugs: Secondary | ICD-10-CM

## 2023-02-13 DIAGNOSIS — Z9981 Dependence on supplemental oxygen: Secondary | ICD-10-CM

## 2023-02-13 DIAGNOSIS — I5032 Chronic diastolic (congestive) heart failure: Secondary | ICD-10-CM | POA: Diagnosis present

## 2023-02-13 DIAGNOSIS — Z888 Allergy status to other drugs, medicaments and biological substances status: Secondary | ICD-10-CM

## 2023-02-13 DIAGNOSIS — M549 Dorsalgia, unspecified: Secondary | ICD-10-CM

## 2023-02-13 DIAGNOSIS — Z9842 Cataract extraction status, left eye: Secondary | ICD-10-CM

## 2023-02-13 DIAGNOSIS — Z880 Allergy status to penicillin: Secondary | ICD-10-CM

## 2023-02-13 DIAGNOSIS — N1831 Chronic kidney disease, stage 3a: Secondary | ICD-10-CM | POA: Diagnosis present

## 2023-02-13 DIAGNOSIS — N200 Calculus of kidney: Principal | ICD-10-CM

## 2023-02-13 DIAGNOSIS — N3289 Other specified disorders of bladder: Secondary | ICD-10-CM | POA: Diagnosis present

## 2023-02-13 DIAGNOSIS — I82401 Acute embolism and thrombosis of unspecified deep veins of right lower extremity: Secondary | ICD-10-CM | POA: Diagnosis present

## 2023-02-13 DIAGNOSIS — I82441 Acute embolism and thrombosis of right tibial vein: Secondary | ICD-10-CM | POA: Diagnosis not present

## 2023-02-13 DIAGNOSIS — Z7901 Long term (current) use of anticoagulants: Secondary | ICD-10-CM

## 2023-02-13 DIAGNOSIS — J431 Panlobular emphysema: Secondary | ICD-10-CM | POA: Diagnosis present

## 2023-02-13 DIAGNOSIS — Z961 Presence of intraocular lens: Secondary | ICD-10-CM | POA: Diagnosis present

## 2023-02-13 DIAGNOSIS — F5104 Psychophysiologic insomnia: Secondary | ICD-10-CM | POA: Diagnosis present

## 2023-02-13 DIAGNOSIS — J4489 Other specified chronic obstructive pulmonary disease: Secondary | ICD-10-CM | POA: Diagnosis present

## 2023-02-13 DIAGNOSIS — I2510 Atherosclerotic heart disease of native coronary artery without angina pectoris: Secondary | ICD-10-CM | POA: Diagnosis present

## 2023-02-13 DIAGNOSIS — Z9841 Cataract extraction status, right eye: Secondary | ICD-10-CM

## 2023-02-13 DIAGNOSIS — N179 Acute kidney failure, unspecified: Secondary | ICD-10-CM | POA: Diagnosis present

## 2023-02-13 DIAGNOSIS — Z66 Do not resuscitate: Secondary | ICD-10-CM | POA: Diagnosis present

## 2023-02-13 DIAGNOSIS — E1140 Type 2 diabetes mellitus with diabetic neuropathy, unspecified: Secondary | ICD-10-CM | POA: Diagnosis present

## 2023-02-13 DIAGNOSIS — J439 Emphysema, unspecified: Secondary | ICD-10-CM | POA: Diagnosis present

## 2023-02-13 DIAGNOSIS — Z79899 Other long term (current) drug therapy: Secondary | ICD-10-CM

## 2023-02-13 DIAGNOSIS — E782 Mixed hyperlipidemia: Secondary | ICD-10-CM | POA: Diagnosis present

## 2023-02-13 DIAGNOSIS — E66813 Obesity, class 3: Secondary | ICD-10-CM | POA: Diagnosis present

## 2023-02-13 DIAGNOSIS — J449 Chronic obstructive pulmonary disease, unspecified: Secondary | ICD-10-CM | POA: Diagnosis present

## 2023-02-13 DIAGNOSIS — E1169 Type 2 diabetes mellitus with other specified complication: Secondary | ICD-10-CM | POA: Diagnosis present

## 2023-02-13 DIAGNOSIS — I130 Hypertensive heart and chronic kidney disease with heart failure and stage 1 through stage 4 chronic kidney disease, or unspecified chronic kidney disease: Secondary | ICD-10-CM | POA: Diagnosis present

## 2023-02-13 DIAGNOSIS — J9611 Chronic respiratory failure with hypoxia: Secondary | ICD-10-CM | POA: Diagnosis present

## 2023-02-13 DIAGNOSIS — D3502 Benign neoplasm of left adrenal gland: Secondary | ICD-10-CM | POA: Diagnosis present

## 2023-02-13 DIAGNOSIS — G4700 Insomnia, unspecified: Secondary | ICD-10-CM | POA: Diagnosis present

## 2023-02-13 LAB — URINALYSIS, ROUTINE W REFLEX MICROSCOPIC
Bilirubin Urine: NEGATIVE
Glucose, UA: NEGATIVE mg/dL
Ketones, ur: NEGATIVE mg/dL
Nitrite: NEGATIVE
Protein, ur: NEGATIVE mg/dL
RBC / HPF: >50 RBC/hpf (ref 0–5)
Specific Gravity, Urine: 1.01 (ref 1.005–1.030)
WBC, UA: >50 WBC/hpf (ref 0–5)
pH: 6 (ref 5.0–8.0)

## 2023-02-13 LAB — CBC WITH DIFFERENTIAL/PLATELET
Abs Immature Granulocytes: 0.04 10*3/uL (ref 0.00–0.07)
Basophils Absolute: 0.1 10*3/uL (ref 0.0–0.1)
Basophils Relative: 1 %
Eosinophils Absolute: 0.2 10*3/uL (ref 0.0–0.5)
Eosinophils Relative: 2 %
HCT: 35.5 % — ABNORMAL LOW (ref 36.0–46.0)
Hemoglobin: 10.9 g/dL — ABNORMAL LOW (ref 12.0–15.0)
Immature Granulocytes: 0 %
Lymphocytes Relative: 17 %
Lymphs Abs: 1.8 10*3/uL (ref 0.7–4.0)
MCH: 30.7 pg (ref 26.0–34.0)
MCHC: 30.7 g/dL (ref 30.0–36.0)
MCV: 100 fL (ref 80.0–100.0)
Monocytes Absolute: 0.8 10*3/uL (ref 0.1–1.0)
Monocytes Relative: 8 %
Neutro Abs: 7.4 10*3/uL (ref 1.7–7.7)
Neutrophils Relative %: 72 %
Platelets: 256 10*3/uL (ref 150–400)
RBC: 3.55 MIL/uL — ABNORMAL LOW (ref 3.87–5.11)
RDW: 13.3 % (ref 11.5–15.5)
WBC: 10.3 10*3/uL (ref 4.0–10.5)
nRBC: 0 % (ref 0.0–0.2)

## 2023-02-13 LAB — COMPREHENSIVE METABOLIC PANEL
ALT: 31 U/L (ref 0–44)
AST: 33 U/L (ref 15–41)
Albumin: 3.7 g/dL (ref 3.5–5.0)
Alkaline Phosphatase: 56 U/L (ref 38–126)
Anion gap: 11 (ref 5–15)
BUN: 24 mg/dL — ABNORMAL HIGH (ref 8–23)
CO2: 25 mmol/L (ref 22–32)
Calcium: 9 mg/dL (ref 8.9–10.3)
Chloride: 99 mmol/L (ref 98–111)
Creatinine, Ser: 0.91 mg/dL (ref 0.44–1.00)
GFR, Estimated: >60 mL/min (ref >60)
Glucose, Bld: 185 mg/dL — ABNORMAL HIGH (ref 70–99)
Potassium: 3.9 mmol/L (ref 3.5–5.1)
Sodium: 135 mmol/L (ref 135–145)
Total Bilirubin: 0.3 mg/dL (ref <1.2)
Total Protein: 6.8 g/dL (ref 6.5–8.1)

## 2023-02-13 LAB — RESP PANEL BY RT-PCR (RSV, FLU A&B, COVID)  RVPGX2
Influenza A by PCR: NEGATIVE
Influenza B by PCR: NEGATIVE
Resp Syncytial Virus by PCR: NEGATIVE
SARS Coronavirus 2 by RT PCR: NEGATIVE

## 2023-02-13 LAB — TROPONIN I (HIGH SENSITIVITY)
Troponin I (High Sensitivity): 8 ng/L (ref <18)
Troponin I (High Sensitivity): 8 ng/L (ref <18)

## 2023-02-13 LAB — LACTIC ACID, PLASMA
Lactic Acid, Venous: 1.1 mmol/L (ref 0.5–1.9)
Lactic Acid, Venous: 0.7 mmol/L (ref 0.5–1.9)

## 2023-02-13 LAB — BRAIN NATRIURETIC PEPTIDE: B Natriuretic Peptide: 62 pg/mL (ref 0.0–100.0)

## 2023-02-13 MED ORDER — MORPHINE SULFATE (PF) 4 MG/ML IV SOLN
4.0000 mg | Freq: Once | INTRAVENOUS | Status: AC
  Administered 2023-02-13: 4 mg via INTRAVENOUS
  Filled 2023-02-13: qty 1

## 2023-02-13 MED ORDER — ONDANSETRON HCL 4 MG/2ML IJ SOLN
4.0000 mg | Freq: Once | INTRAMUSCULAR | Status: AC
  Administered 2023-02-13: 4 mg via INTRAVENOUS
  Filled 2023-02-13: qty 2

## 2023-02-13 MED ORDER — SODIUM CHLORIDE 0.9 % IV SOLN
1.0000 g | Freq: Once | INTRAVENOUS | Status: AC
  Administered 2023-02-13: 1 g via INTRAVENOUS
  Filled 2023-02-13: qty 10

## 2023-02-13 NOTE — ED Notes (Signed)
Collected pending lab. Unable to collected second set of blood cultures. Started IV Abx. EDP updated

## 2023-02-13 NOTE — Consult Note (Signed)
Urology Consult   Physician req uesting consult: C. Tegeler, MD  Reason for consult: nephrolithiasis  History of Present Illness: Danielle [name] is a 89 y.o. woman with multiple serious medical comorbidities (on eliquis) with a several day h/o right sided flank pain.  Seen a few days ago at [facility] and found to have a right proximal 8mm calculus and was sent out with recommended urologic FU.  Patient has also been treated with multiple courses of po antibiotics for presumed UTI over past 2 months.  Has been on macrodantin past 7 days.  Patient now presents to [facility] ED with worsening pain.  No Fever  No leukocytosis.  Nl renal function. UA remarkable for significant microhematuria and pyuria; nitrate neg and only few bacteria noted on micro. Lactate normal. Repeat CT stone study---- Bilateral nonobstructing renal calculi stable from the prior exam and stable 8-9 mm proximal right ureteral stone with mild fullness of the right renal pelvis.  Pain not well controlled in ED.  Patient being admitted to medical service for observation and pain control.    Past Medical History:  Diagnosis Date   Anginal pain (HCC)    Arthritis    Asthma    At high risk for falls 11/11/2017   Chest pain 11/09/2015   CHF (congestive heart failure) (HCC)    Chronic diastolic congestive heart failure (HCC) 11/11/2015   Chronic insomnia 10/24/2015   Chronic pain of both knees 05/14/2018   Chronic respiratory failure with hypoxia (HCC) 05/05/2017   CKD (chronic kidney disease) stage 3, GFR 30-59 ml/min (HCC) 05/07/2017   COPD (chronic obstructive pulmonary disease) (HCC)    Coronary artery disease    Coronary artery disease involving native coronary artery of native heart without angina pectoris 11/14/2014   Overview:  40% RCA a cardiac catheter in 2014   Current mild episode of major depressive disorder (HCC) 11/14/2014   Depression    DOE (dyspnea on exertion) 09/07/2018   Dysphagia  08/12/2018   Edema 08/12/2018   Elevated blood sugar 11/09/2015   Emphysema lung (HCC)    Flat feet, bilateral 05/30/2020   Gastroesophageal reflux disease without esophagitis 11/11/2017   GERD (gastroesophageal reflux disease)    Headache    History of hiatal hernia    Hyperlipidemia    Hypertension    Insomnia    Internal hemorrhoids 11/26/2018   Iron deficiency anemia 08/12/2018   Malaise and fatigue 08/14/2015   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/26/2018   Mesenteric mass 02/19/2015   Midsternal chest pain 11/11/2015   Mixed hyperlipidemia 11/14/2014   Morbid obesity (HCC) 09/07/2018   OSA (obstructive sleep apnea) 12/16/2019   Osteoporosis    Panlobular emphysema (HCC) 11/09/2015   Pneumonia 2013   PONV (postoperative nausea and vomiting)    Positive D-dimer 09/08/2018   Primary osteoarthritis involving multiple joints 08/14/2015   Restless legs syndrome 03/04/2018   Senile purpura (HCC) 08/12/2018   Shortness of breath dyspnea    with exertion   Stasis edema of left lower extremity 04/13/2018   Strain of right shoulder 08/12/2018   Type 2 diabetes mellitus with stage 3 chronic kidney disease (HCC) 08/14/2015   Unstable angina (HCC) 10/30/2018   Vitamin B12 deficiency 04/13/2018   Vitamin D deficiency 02/27/2016    Past Surgical History:  Procedure Laterality Date   ABDOMINAL HYSTERECTOMY     APPENDECTOMY     BREAST SURGERY  40 yrs. ago   growth removed in each breast-benign   CARDIAC CATHETERIZATION  12/14/12 [facility] ([facility]): few mild stenotic areas max 40% of ectatic RCA o/w NL coronaries, EF 65%. Med tx.   CATARACT EXTRACTION W/ INTRAOCULAR LENS  IMPLANT, BILATERAL Bilateral 15 yrs ago   CHOLECYSTECTOMY     COLONOSCOPY  04/22/2013   Diverticulosis in the sigmoid colon. Non bleeding internal hemorrhoids. Normal mucosa vascular pattern in the entire examined colon. Three 6 mm polyps n the descending colon. Resected and retrieved.    FOOT SURGERY     LAPAROSCOPIC APPENDECTOMY N/A  02/19/2015   Procedure: EXCISION OF MESENTERIC MASS;  Surgeon: Almond Lint, MD;  Location: [facility] OR;  Service: General;  Laterality: N/A;   REPLACEMENT TOTAL KNEE BILATERAL      Medications:  Home meds:    Scheduled Meds: Continuous Infusions:  cefTRIAXone (ROCEPHIN)  IV     PRN Meds:.  Allergies:  Allergies  Allergen Reactions   Codeine Swelling   Levofloxacin     Other reaction(s): Malaise (intolerance)   Penicillins Swelling    Has patient had a PCN reaction causing immediate rash, facial/tongue/throat swelling, SOB or lightheadedness with hypotension:YES Has patient had a PCN reaction causing severe rash involving mucus membranes or skin necrosis: NO Has patient had a PCN reaction that required hospitalization NO Has patient had a PCN reaction occurring within the last 10 years: NO If all of the above answers are "NO", then may proceed with Cephalosporin use.   Tape Rash    Family History  Problem Relation Age of Onset   Heart attack Mother     Social History:  reports that she quit smoking about 6 years ago. Her smoking use included cigarettes. She has never used smokeless tobacco. She reports that she does not drink alcohol and does not use drugs.  ROS: A complete review of systems was performed.  All systems are negative except for pertinent findings as noted.  Physical Exam:  Vital signs in last 24 hours: Temp:  [97.7 F (36.5 C)-98.5 F (36.9 C)] 97.9 F (36.6 C) (11/15 1912) Pulse Rate:  [55-65] 63 (11/15 2100) Resp:  [15-26] 17 (11/15 2100) BP: (120-149)/(37-81) 125/55 (11/15 2100) SpO2:  [2 %-100 %] 100 % (11/15 2100) Constitutional:  Alert and oriented, No acute distress   Laboratory Data:  Recent Labs    02/13/23 1252  WBC 10.3  HGB 10.9*  HCT 35.5*  PLT 256    Recent Labs    02/13/23 1252  NA 135  K 3.9  CL 99  GLUCOSE 185*  BUN 24*  CALCIUM 9.0  CREATININE 0.91     Results for orders placed or performed during the hospital  encounter of 02/13/23 (from the past 24 hour(s))  CBC with Differential     Status: Abnormal   Collection Time: 02/13/23 12:52 PM  Result Value Ref Range   WBC 10.3 4.0 - 10.5 K/uL   RBC 3.55 (L) 3.87 - 5.11 MIL/uL   Hemoglobin 10.9 (L) 12.0 - 15.0 g/dL   HCT 38.7 (L) 56.4 - 33.2 %   MCV 100.0 80.0 - 100.0 fL   MCH 30.7 26.0 - 34.0 pg   MCHC 30.7 30.0 - 36.0 g/dL   RDW 95.1 88.4 - 16.6 %   Platelets 256 150 - 400 K/uL   nRBC 0.0 0.0 - 0.2 %   Neutrophils Relative % 72 %   Neutro Abs 7.4 1.7 - 7.7 K/uL   Lymphocytes Relative 17 %   Lymphs Abs 1.8 0.7 - 4.0 K/uL   Monocytes Relative 8 %  Monocytes Absolute 0.8 0.1 - 1.0 K/uL   Eosinophils Relative 2 %   Eosinophils Absolute 0.2 0.0 - 0.5 K/uL   Basophils Relative 1 %   Basophils Absolute 0.1 0.0 - 0.1 K/uL   Immature Granulocytes 0 %   Abs Immature Granulocytes 0.04 0.00 - 0.07 K/uL  Comprehensive metabolic panel     Status: Abnormal   Collection Time: 02/13/23 12:52 PM  Result Value Ref Range   Sodium 135 135 - 145 mmol/L   Potassium 3.9 3.5 - 5.1 mmol/L   Chloride 99 98 - 111 mmol/L   CO2 25 22 - 32 mmol/L   Glucose, Bld 185 (H) 70 - 99 mg/dL   BUN 24 (H) 8 - 23 mg/dL   Creatinine, Ser 8.29 0.44 - 1.00 mg/dL   Calcium 9.0 8.9 - 56.2 mg/dL   Total Protein 6.8 6.5 - 8.1 g/dL   Albumin 3.7 3.5 - 5.0 g/dL   AST 33 15 - 41 U/L   ALT 31 0 - 44 U/L   Alkaline Phosphatase 56 38 - 126 U/L   Total Bilirubin 0.3 <1.2 mg/dL   GFR, Estimated >13 >08 mL/min   Anion gap 11 5 - 15  Urinalysis, Routine w reflex microscopic -Urine, Clean Catch     Status: Abnormal   Collection Time: 02/13/23 12:52 PM  Result Value Ref Range   Color, Urine YELLOW YELLOW   APPearance HAZY (A) CLEAR   Specific Gravity, Urine 1.010 1.005 - 1.030   pH 6.0 5.0 - 8.0   Glucose, UA NEGATIVE NEGATIVE mg/dL   Hgb urine dipstick LARGE (A) NEGATIVE   Bilirubin Urine NEGATIVE NEGATIVE   Ketones, ur NEGATIVE NEGATIVE mg/dL   Protein, ur NEGATIVE NEGATIVE  mg/dL   Nitrite NEGATIVE NEGATIVE   Leukocytes,Ua LARGE (A) NEGATIVE   RBC / HPF >50 0 - 5 RBC/hpf   WBC, UA >50 0 - 5 WBC/hpf   Bacteria, UA RARE (A) NONE SEEN   Squamous Epithelial / HPF 0-5 0 - 5 /HPF   WBC Clumps PRESENT    Mucus PRESENT   Resp panel by RT-PCR (RSV, Flu A&B, Covid) Anterior Nasal Swab     Status: None   Collection Time: 02/13/23  5:55 PM   Specimen: Anterior Nasal Swab  Result Value Ref Range   SARS Coronavirus 2 by RT PCR NEGATIVE NEGATIVE   Influenza A by PCR NEGATIVE NEGATIVE   Influenza B by PCR NEGATIVE NEGATIVE   Resp Syncytial Virus by PCR NEGATIVE NEGATIVE  Lactic acid, plasma     Status: None   Collection Time: 02/13/23  7:51 PM  Result Value Ref Range   Lactic Acid, Venous 1.1 0.5 - 1.9 mmol/L  Brain natriuretic peptide     Status: None   Collection Time: 02/13/23  7:51 PM  Result Value Ref Range   B Natriuretic Peptide 62.0 0.0 - 100.0 pg/mL  Troponin I (High Sensitivity)     Status: None   Collection Time: 02/13/23  7:51 PM  Result Value Ref Range   Troponin I (High Sensitivity) 8 <18 ng/L   Recent Results (from the past 240 hour(s))  Resp panel by RT-PCR (RSV, Flu A&B, Covid) Anterior Nasal Swab     Status: None   Collection Time: 02/13/23  5:55 PM   Specimen: Anterior Nasal Swab  Result Value Ref Range Status   SARS Coronavirus 2 by RT PCR NEGATIVE NEGATIVE Final   Influenza A by PCR NEGATIVE NEGATIVE Final   Influenza B  by PCR NEGATIVE NEGATIVE Final    Comment: (NOTE) The Xpert Xpress SARS-CoV-2/FLU/RSV plus assay is intended as an aid in the diagnosis of influenza from Nasopharyngeal swab specimens and should not be used as a sole basis for treatment. Nasal washings and aspirates are unacceptable for Xpert Xpress SARS-CoV-2/FLU/RSV testing.  Fact Sheet for Patients: BloggerCourse.com  Fact Sheet for Healthcare Providers: SeriousBroker.it  This test is not yet approved or cleared  by the [place] FDA and has been authorized for detection and/or diagnosis of SARS-CoV-2 by FDA under an Emergency Use Authorization (EUA). This EUA will remain in effect (meaning this test can be used) for the duration of the COVID-19 declaration under Section 564(b)(1) of the Act, 21 U.S.C. section 360bbb-3(b)(1), unless the authorization is terminated or revoked.     Resp Syncytial Virus by PCR NEGATIVE NEGATIVE Final    Comment: (NOTE) Fact Sheet for Patients: BloggerCourse.com  Fact Sheet for Healthcare Providers: SeriousBroker.it  This test is not yet approved or cleared by the [place] FDA and has been authorized for detection and/or diagnosis of SARS-CoV-2 by FDA under an Emergency Use Authorization (EUA). This EUA will remain in effect (meaning this test can be used) for the duration of the COVID-19 declaration under Section 564(b)(1) of the Act, 21 U.S.C. section 360bbb-3(b)(1), unless the authorization is terminated or revoked.  Performed at [facility], [address], [place], [place] [address]     Renal Function: Recent Labs    02/13/23 1252  CREATININE 0.91   CrCl cannot be calculated (Unknown ideal weight.).  Radiologic Imaging: CT Renal Stone Study  Result Date: 02/13/2023 CLINICAL DATA:  Flank pain and known right proximal ureteral stone EXAM: CT ABDOMEN AND PELVIS WITHOUT CONTRAST TECHNIQUE: Multidetector CT imaging of the abdomen and pelvis was performed following the standard protocol without IV contrast. RADIATION DOSE REDUCTION: This exam was performed according to the departmental dose-optimization program which includes automated exposure control, adjustment of the mA and/or kV according to patient size and/or use of iterative reconstruction technique. COMPARISON:  02/11/2023 FINDINGS: Lower chest: No acute abnormality. Hepatobiliary: No focal liver abnormality is seen. Status post  cholecystectomy. No biliary dilatation. Pancreas: Unremarkable. No pancreatic ductal dilatation or surrounding inflammatory changes. Spleen: Normal in size without focal abnormality. Adrenals/Urinary Tract: Adrenal glands show a left adrenal adenoma stable in appearance from the prior exam. This measures up to 18 mm and is been seen on multiple previous studies. Left kidney demonstrates nonobstructing renal calculi measuring up to 8 mm. Left ureter is within normal limits. Right kidney demonstrates multiple renal calculi stable in appearance from the prior exam. Additionally a proximal right ureteral stone is noted measuring 8-9 mm. Mild fullness of the right renal pelvis is noted similar to that seen on the prior exam. The more distal right ureter is unremarkable. The bladder is decompressed. Small left renal cyst is noted stable from the prior study. Stomach/Bowel: Scattered diverticular change of the colon is noted without evidence of diverticulitis. No obstructive changes are seen. The appendix has been surgically removed. Small bowel and stomach are unremarkable. Vascular/Lymphatic: Aortic atherosclerosis. No enlarged abdominal or pelvic lymph nodes. Reproductive: Status post hysterectomy. No adnexal masses. Other: No abdominal wall hernia or abnormality. No abdominopelvic ascites. Musculoskeletal: No acute or significant osseous findings. IMPRESSION: Bilateral nonobstructing renal calculi stable from the prior exam. Stable 8-9 mm proximal right ureteral stone with mild fullness of the right renal pelvis. Stable left adrenal lesion consistent with adenoma. This is been  stable over multiple previous exams and no further follow-up is recommended. Electronically Signed   By: Alcide Clever M.D.   On: 02/13/2023 20:48   DG Chest Portable 1 View  Result Date: 02/13/2023 CLINICAL DATA:  Cough. EXAM: PORTABLE CHEST 1 VIEW COMPARISON:  Chest radiograph dated 01/15/2023 FINDINGS: Background of emphysema. No focal  consolidation, pleural effusion, pneumothorax. Mild cardiomegaly. Atherosclerotic calcification of the aorta. No acute osseous pathology. IMPRESSION: 1. No active disease. 2. Emphysema. 3. Mild cardiomegaly. Electronically Signed   By: Elgie Collard M.D.   On: 02/13/2023 20:04    I independently reviewed the above imaging studies.  Impression/Recommendation Right proximal ureteral stone.  Patient in ED for pain control now x2 within past several days. I had a long discussion with the patient and family regarding options for mgmt.  Given her age and multiple serious comorbidities I have recommended intervention for urinary tract drainage with dj stent placement.  Rationale and nature of procedure reviewed in detail.  Informed consent obtained.  Given stable clinical picture patient will be admitted by medicine and hydrated and started on iv antibiotics.  Will plan on procedure in am.  Joline Maxcy 02/13/2023, 9:31 PM

## 2023-02-13 NOTE — ED Notes (Signed)
Pt provided with ice chips per approval of Nephrology

## 2023-02-13 NOTE — ED Notes (Signed)
Pt assisted to the bedside commode with assistance of two people.

## 2023-02-13 NOTE — ED Triage Notes (Addendum)
Pt c/o lower abd pain x a few days; hx frequent UTI's, confirmed 11 mm kidney stone per pt; wears O2 PRN; endorses nausea and dysuria; endorses sob, chronic

## 2023-02-13 NOTE — H&P (View-Only) (Signed)
Urology Consult   Physician req uesting consult: C. Tegeler, MD  Reason for consult: nephrolithiasis  History of Present Illness: Danielle [name] is a 89 y.o. woman with multiple serious medical comorbidities (on eliquis) with a several day h/o right sided flank pain.  Seen a few days ago at [facility] and found to have a right proximal 8mm calculus and was sent out with recommended urologic FU.  Patient has also been treated with multiple courses of po antibiotics for presumed UTI over past 2 months.  Has been on macrodantin past 7 days.  Patient now presents to [facility] ED with worsening pain.  No Fever  No leukocytosis.  Nl renal function. UA remarkable for significant microhematuria and pyuria; nitrate neg and only few bacteria noted on micro. Lactate normal. Repeat CT stone study---- Bilateral nonobstructing renal calculi stable from the prior exam and stable 8-9 mm proximal right ureteral stone with mild fullness of the right renal pelvis.  Pain not well controlled in ED.  Patient being admitted to medical service for observation and pain control.    Past Medical History:  Diagnosis Date   Anginal pain (HCC)    Arthritis    Asthma    At high risk for falls 11/11/2017   Chest pain 11/09/2015   CHF (congestive heart failure) (HCC)    Chronic diastolic congestive heart failure (HCC) 11/11/2015   Chronic insomnia 10/24/2015   Chronic pain of both knees 05/14/2018   Chronic respiratory failure with hypoxia (HCC) 05/05/2017   CKD (chronic kidney disease) stage 3, GFR 30-59 ml/min (HCC) 05/07/2017   COPD (chronic obstructive pulmonary disease) (HCC)    Coronary artery disease    Coronary artery disease involving native coronary artery of native heart without angina pectoris 11/14/2014   Overview:  40% RCA a cardiac catheter in 2014   Current mild episode of major depressive disorder (HCC) 11/14/2014   Depression    DOE (dyspnea on exertion) 09/07/2018   Dysphagia  08/12/2018   Edema 08/12/2018   Elevated blood sugar 11/09/2015   Emphysema lung (HCC)    Flat feet, bilateral 05/30/2020   Gastroesophageal reflux disease without esophagitis 11/11/2017   GERD (gastroesophageal reflux disease)    Headache    History of hiatal hernia    Hyperlipidemia    Hypertension    Insomnia    Internal hemorrhoids 11/26/2018   Iron deficiency anemia 08/12/2018   Malaise and fatigue 08/14/2015   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/26/2018   Mesenteric mass 02/19/2015   Midsternal chest pain 11/11/2015   Mixed hyperlipidemia 11/14/2014   Morbid obesity (HCC) 09/07/2018   OSA (obstructive sleep apnea) 12/16/2019   Osteoporosis    Panlobular emphysema (HCC) 11/09/2015   Pneumonia 2013   PONV (postoperative nausea and vomiting)    Positive D-dimer 09/08/2018   Primary osteoarthritis involving multiple joints 08/14/2015   Restless legs syndrome 03/04/2018   Senile purpura (HCC) 08/12/2018   Shortness of breath dyspnea    with exertion   Stasis edema of left lower extremity 04/13/2018   Strain of right shoulder 08/12/2018   Type 2 diabetes mellitus with stage 3 chronic kidney disease (HCC) 08/14/2015   Unstable angina (HCC) 10/30/2018   Vitamin B12 deficiency 04/13/2018   Vitamin D deficiency 02/27/2016    Past Surgical History:  Procedure Laterality Date   ABDOMINAL HYSTERECTOMY     APPENDECTOMY     BREAST SURGERY  40 yrs. ago   growth removed in each breast-benign   CARDIAC CATHETERIZATION  12/14/12 [facility] ([facility]): few mild stenotic areas max 40% of ectatic RCA o/w NL coronaries, EF 65%. Med tx.   CATARACT EXTRACTION W/ INTRAOCULAR LENS  IMPLANT, BILATERAL Bilateral 15 yrs ago   CHOLECYSTECTOMY     COLONOSCOPY  04/22/2013   Diverticulosis in the sigmoid colon. Non bleeding internal hemorrhoids. Normal mucosa vascular pattern in the entire examined colon. Three 6 mm polyps n the descending colon. Resected and retrieved.    FOOT SURGERY     LAPAROSCOPIC APPENDECTOMY N/A  02/19/2015   Procedure: EXCISION OF MESENTERIC MASS;  Surgeon: Almond Lint, MD;  Location: [facility] OR;  Service: General;  Laterality: N/A;   REPLACEMENT TOTAL KNEE BILATERAL      Medications:  Home meds:    Scheduled Meds: Continuous Infusions:  cefTRIAXone (ROCEPHIN)  IV     PRN Meds:.  Allergies:  Allergies  Allergen Reactions   Codeine Swelling   Levofloxacin     Other reaction(s): Malaise (intolerance)   Penicillins Swelling    Has patient had a PCN reaction causing immediate rash, facial/tongue/throat swelling, SOB or lightheadedness with hypotension:YES Has patient had a PCN reaction causing severe rash involving mucus membranes or skin necrosis: NO Has patient had a PCN reaction that required hospitalization NO Has patient had a PCN reaction occurring within the last 10 years: NO If all of the above answers are "NO", then may proceed with Cephalosporin use.   Tape Rash    Family History  Problem Relation Age of Onset   Heart attack Mother     Social History:  reports that she quit smoking about 6 years ago. Her smoking use included cigarettes. She has never used smokeless tobacco. She reports that she does not drink alcohol and does not use drugs.  ROS: A complete review of systems was performed.  All systems are negative except for pertinent findings as noted.  Physical Exam:  Vital signs in last 24 hours: Temp:  [97.7 F (36.5 C)-98.5 F (36.9 C)] 97.9 F (36.6 C) (11/15 1912) Pulse Rate:  [55-65] 63 (11/15 2100) Resp:  [15-26] 17 (11/15 2100) BP: (120-149)/(37-81) 125/55 (11/15 2100) SpO2:  [2 %-100 %] 100 % (11/15 2100) Constitutional:  Alert and oriented, No acute distress   Laboratory Data:  Recent Labs    02/13/23 1252  WBC 10.3  HGB 10.9*  HCT 35.5*  PLT 256    Recent Labs    02/13/23 1252  NA 135  K 3.9  CL 99  GLUCOSE 185*  BUN 24*  CALCIUM 9.0  CREATININE 0.91     Results for orders placed or performed during the hospital  encounter of 02/13/23 (from the past 24 hour(s))  CBC with Differential     Status: Abnormal   Collection Time: 02/13/23 12:52 PM  Result Value Ref Range   WBC 10.3 4.0 - 10.5 K/uL   RBC 3.55 (L) 3.87 - 5.11 MIL/uL   Hemoglobin 10.9 (L) 12.0 - 15.0 g/dL   HCT 38.7 (L) 56.4 - 33.2 %   MCV 100.0 80.0 - 100.0 fL   MCH 30.7 26.0 - 34.0 pg   MCHC 30.7 30.0 - 36.0 g/dL   RDW 95.1 88.4 - 16.6 %   Platelets 256 150 - 400 K/uL   nRBC 0.0 0.0 - 0.2 %   Neutrophils Relative % 72 %   Neutro Abs 7.4 1.7 - 7.7 K/uL   Lymphocytes Relative 17 %   Lymphs Abs 1.8 0.7 - 4.0 K/uL   Monocytes Relative 8 %  Monocytes Absolute 0.8 0.1 - 1.0 K/uL   Eosinophils Relative 2 %   Eosinophils Absolute 0.2 0.0 - 0.5 K/uL   Basophils Relative 1 %   Basophils Absolute 0.1 0.0 - 0.1 K/uL   Immature Granulocytes 0 %   Abs Immature Granulocytes 0.04 0.00 - 0.07 K/uL  Comprehensive metabolic panel     Status: Abnormal   Collection Time: 02/13/23 12:52 PM  Result Value Ref Range   Sodium 135 135 - 145 mmol/L   Potassium 3.9 3.5 - 5.1 mmol/L   Chloride 99 98 - 111 mmol/L   CO2 25 22 - 32 mmol/L   Glucose, Bld 185 (H) 70 - 99 mg/dL   BUN 24 (H) 8 - 23 mg/dL   Creatinine, Ser 8.29 0.44 - 1.00 mg/dL   Calcium 9.0 8.9 - 56.2 mg/dL   Total Protein 6.8 6.5 - 8.1 g/dL   Albumin 3.7 3.5 - 5.0 g/dL   AST 33 15 - 41 U/L   ALT 31 0 - 44 U/L   Alkaline Phosphatase 56 38 - 126 U/L   Total Bilirubin 0.3 <1.2 mg/dL   GFR, Estimated >13 >08 mL/min   Anion gap 11 5 - 15  Urinalysis, Routine w reflex microscopic -Urine, Clean Catch     Status: Abnormal   Collection Time: 02/13/23 12:52 PM  Result Value Ref Range   Color, Urine YELLOW YELLOW   APPearance HAZY (A) CLEAR   Specific Gravity, Urine 1.010 1.005 - 1.030   pH 6.0 5.0 - 8.0   Glucose, UA NEGATIVE NEGATIVE mg/dL   Hgb urine dipstick LARGE (A) NEGATIVE   Bilirubin Urine NEGATIVE NEGATIVE   Ketones, ur NEGATIVE NEGATIVE mg/dL   Protein, ur NEGATIVE NEGATIVE  mg/dL   Nitrite NEGATIVE NEGATIVE   Leukocytes,Ua LARGE (A) NEGATIVE   RBC / HPF >50 0 - 5 RBC/hpf   WBC, UA >50 0 - 5 WBC/hpf   Bacteria, UA RARE (A) NONE SEEN   Squamous Epithelial / HPF 0-5 0 - 5 /HPF   WBC Clumps PRESENT    Mucus PRESENT   Resp panel by RT-PCR (RSV, Flu A&B, Covid) Anterior Nasal Swab     Status: None   Collection Time: 02/13/23  5:55 PM   Specimen: Anterior Nasal Swab  Result Value Ref Range   SARS Coronavirus 2 by RT PCR NEGATIVE NEGATIVE   Influenza A by PCR NEGATIVE NEGATIVE   Influenza B by PCR NEGATIVE NEGATIVE   Resp Syncytial Virus by PCR NEGATIVE NEGATIVE  Lactic acid, plasma     Status: None   Collection Time: 02/13/23  7:51 PM  Result Value Ref Range   Lactic Acid, Venous 1.1 0.5 - 1.9 mmol/L  Brain natriuretic peptide     Status: None   Collection Time: 02/13/23  7:51 PM  Result Value Ref Range   B Natriuretic Peptide 62.0 0.0 - 100.0 pg/mL  Troponin I (High Sensitivity)     Status: None   Collection Time: 02/13/23  7:51 PM  Result Value Ref Range   Troponin I (High Sensitivity) 8 <18 ng/L   Recent Results (from the past 240 hour(s))  Resp panel by RT-PCR (RSV, Flu A&B, Covid) Anterior Nasal Swab     Status: None   Collection Time: 02/13/23  5:55 PM   Specimen: Anterior Nasal Swab  Result Value Ref Range Status   SARS Coronavirus 2 by RT PCR NEGATIVE NEGATIVE Final   Influenza A by PCR NEGATIVE NEGATIVE Final   Influenza B  by PCR NEGATIVE NEGATIVE Final    Comment: (NOTE) The Xpert Xpress SARS-CoV-2/FLU/RSV plus assay is intended as an aid in the diagnosis of influenza from Nasopharyngeal swab specimens and should not be used as a sole basis for treatment. Nasal washings and aspirates are unacceptable for Xpert Xpress SARS-CoV-2/FLU/RSV testing.  Fact Sheet for Patients: BloggerCourse.com  Fact Sheet for Healthcare Providers: SeriousBroker.it  This test is not yet approved or cleared  by the [place] FDA and has been authorized for detection and/or diagnosis of SARS-CoV-2 by FDA under an Emergency Use Authorization (EUA). This EUA will remain in effect (meaning this test can be used) for the duration of the COVID-19 declaration under Section 564(b)(1) of the Act, 21 U.S.C. section 360bbb-3(b)(1), unless the authorization is terminated or revoked.     Resp Syncytial Virus by PCR NEGATIVE NEGATIVE Final    Comment: (NOTE) Fact Sheet for Patients: BloggerCourse.com  Fact Sheet for Healthcare Providers: SeriousBroker.it  This test is not yet approved or cleared by the [place] FDA and has been authorized for detection and/or diagnosis of SARS-CoV-2 by FDA under an Emergency Use Authorization (EUA). This EUA will remain in effect (meaning this test can be used) for the duration of the COVID-19 declaration under Section 564(b)(1) of the Act, 21 U.S.C. section 360bbb-3(b)(1), unless the authorization is terminated or revoked.  Performed at [facility], [address], [place], [place] [address]     Renal Function: Recent Labs    02/13/23 1252  CREATININE 0.91   CrCl cannot be calculated (Unknown ideal weight.).  Radiologic Imaging: CT Renal Stone Study  Result Date: 02/13/2023 CLINICAL DATA:  Flank pain and known right proximal ureteral stone EXAM: CT ABDOMEN AND PELVIS WITHOUT CONTRAST TECHNIQUE: Multidetector CT imaging of the abdomen and pelvis was performed following the standard protocol without IV contrast. RADIATION DOSE REDUCTION: This exam was performed according to the departmental dose-optimization program which includes automated exposure control, adjustment of the mA and/or kV according to patient size and/or use of iterative reconstruction technique. COMPARISON:  02/11/2023 FINDINGS: Lower chest: No acute abnormality. Hepatobiliary: No focal liver abnormality is seen. Status post  cholecystectomy. No biliary dilatation. Pancreas: Unremarkable. No pancreatic ductal dilatation or surrounding inflammatory changes. Spleen: Normal in size without focal abnormality. Adrenals/Urinary Tract: Adrenal glands show a left adrenal adenoma stable in appearance from the prior exam. This measures up to 18 mm and is been seen on multiple previous studies. Left kidney demonstrates nonobstructing renal calculi measuring up to 8 mm. Left ureter is within normal limits. Right kidney demonstrates multiple renal calculi stable in appearance from the prior exam. Additionally a proximal right ureteral stone is noted measuring 8-9 mm. Mild fullness of the right renal pelvis is noted similar to that seen on the prior exam. The more distal right ureter is unremarkable. The bladder is decompressed. Small left renal cyst is noted stable from the prior study. Stomach/Bowel: Scattered diverticular change of the colon is noted without evidence of diverticulitis. No obstructive changes are seen. The appendix has been surgically removed. Small bowel and stomach are unremarkable. Vascular/Lymphatic: Aortic atherosclerosis. No enlarged abdominal or pelvic lymph nodes. Reproductive: Status post hysterectomy. No adnexal masses. Other: No abdominal wall hernia or abnormality. No abdominopelvic ascites. Musculoskeletal: No acute or significant osseous findings. IMPRESSION: Bilateral nonobstructing renal calculi stable from the prior exam. Stable 8-9 mm proximal right ureteral stone with mild fullness of the right renal pelvis. Stable left adrenal lesion consistent with adenoma. This is been  stable over multiple previous exams and no further follow-up is recommended. Electronically Signed   By: Alcide Clever M.D.   On: 02/13/2023 20:48   DG Chest Portable 1 View  Result Date: 02/13/2023 CLINICAL DATA:  Cough. EXAM: PORTABLE CHEST 1 VIEW COMPARISON:  Chest radiograph dated 01/15/2023 FINDINGS: Background of emphysema. No focal  consolidation, pleural effusion, pneumothorax. Mild cardiomegaly. Atherosclerotic calcification of the aorta. No acute osseous pathology. IMPRESSION: 1. No active disease. 2. Emphysema. 3. Mild cardiomegaly. Electronically Signed   By: Elgie Collard M.D.   On: 02/13/2023 20:04    I independently reviewed the above imaging studies.  Impression/Recommendation Right proximal ureteral stone.  Patient in ED for pain control now x2 within past several days. I had a long discussion with the patient and family regarding options for mgmt.  Given her age and multiple serious comorbidities I have recommended intervention for urinary tract drainage with dj stent placement.  Rationale and nature of procedure reviewed in detail.  Informed consent obtained.  Given stable clinical picture patient will be admitted by medicine and hydrated and started on iv antibiotics.  Will plan on procedure in am.  Joline Maxcy 02/13/2023, 9:31 PM

## 2023-02-13 NOTE — ED Notes (Signed)
Nephrology at bedside

## 2023-02-13 NOTE — ED Notes (Signed)
Pt assisted to bedside commode with assistance from this RN and NT Thuy. Pt requested privacy. Pt understands to not get up without assistance. Informed to used call bell. Pt verbalized understanding. Pts daughter at bedside.

## 2023-02-13 NOTE — ED Provider Triage Note (Signed)
Emergency Medicine Provider Triage Evaluation Note  Danielle [name] , a 89 y.o. female  was evaluated in triage.  Pt complains of lower abdominal pain  Review of Systems  Positive: pain Negative: fever  Physical Exam  BP (!) 120/37   Pulse 62   Temp 98.3 F (36.8 C) (Oral)   Resp (!) 26   SpO2 97%  Gen:   Awake, no distress   Resp:  Normal effort  MSK:   Moves extremities without difficulty  Other:   Tender sprapubic area  Medical Decision Making  Medically screening exam initiated at 12:50 PM.  Appropriate orders placed.  [name] was informed that the remainder of the evaluation will be completed by another provider, this initial triage assessment does not replace that evaluation, and the importance of remaining in the ED until their evaluation is complete.     Elson Areas, [place] 02/13/23 1250

## 2023-02-13 NOTE — ED Provider Notes (Signed)
[place] EMERGENCY DEPARTMENT AT [facility] Provider Note   CSN: [id number] Arrival date & time: 02/13/23  1230     History  Chief Complaint  Patient presents with   Abdominal Pain   Shortness of Breath    Danielle [name] is a 89 y.o. female.  The history is provided by the patient and medical records. No language interpreter was used.  Abdominal Pain Pain location:  Suprapubic Pain quality: aching   Pain radiates to:  L flank Pain severity:  Severe Onset quality:  Gradual Duration:  1 week Timing:  Constant Progression:  Worsening Chronicity:  New Context: not trauma   Relieved by:  Nothing Worsened by:  Nothing Ineffective treatments:  None tried Associated symptoms: chills, cough, fatigue, nausea and shortness of breath   Associated symptoms: no chest pain, no constipation, no fever and no vomiting  Dysuria: pain with urination but not burning. Shortness of Breath Associated symptoms: abdominal pain and cough   Associated symptoms: no chest pain, no diaphoresis, no fever, no headaches, no neck pain, no rash, no vomiting and no wheezing        Home Medications Prior to Admission medications   Medication Sig Start Date End Date Taking? Authorizing Provider  albuterol (PROVENTIL HFA;VENTOLIN HFA) 108 (90 BASE) MCG/ACT inhaler Inhale 2 puffs into the lungs every 6 (six) hours as needed for wheezing or shortness of breath.     [provider]  amiodarone (PACERONE) 200 MG tablet Take 1 tablet by mouth once daily 12/03/22   Flossie Dibble, NP  apixaban (ELIQUIS) 5 MG TABS tablet Take 1 tablet (5 mg total) by mouth 2 (two) times daily. 03/13/22 03/08/23  Alver Sorrow, NP  budesonide-formoterol (SYMBICORT) 160-4.5 MCG/ACT inhaler Inhale 2 puffs into the lungs 2 (two) times daily.    [provider]  citalopram (CELEXA) 20 MG tablet Take 20 mg by mouth daily. 10/12/16   [provider]  clonazePAM (KLONOPIN) 1 MG tablet  Take 1 mg by mouth as needed for anxiety (sleep). 11/20/14   [provider]  diclofenac Sodium (VOLTAREN) 1 % GEL APPLY 2 GRAMS TOPICALLY TO AFFECTED AREA THREE TIMES DAILY Patient taking differently: Apply 2 g topically daily as needed (joint pain). 08/08/19   Nadara Mustard, MD  doxycycline (DORYX) 100 MG EC tablet Take 100 mg by mouth 2 (two) times daily. Last dose 12/25/2022    [provider]  ferrous sulfate 325 (65 FE) MG EC tablet Take 325 mg by mouth daily with breakfast.    [provider]  furosemide (LASIX) 20 MG tablet Take 3 tablets (60 mg total) by mouth daily. 03/13/22 12/23/22  Alver Sorrow, NP  gabapentin (NEURONTIN) 100 MG capsule Take 100 mg by mouth at bedtime.    [provider]  glimepiride (AMARYL) 4 MG tablet Take 4 mg by mouth daily.    [provider]  metFORMIN (GLUCOPHAGE) 500 MG tablet Take 500 mg by mouth 2 (two) times daily.    [provider]  Multiple Vitamin (MULTIVITAMIN) capsule Take 1 capsule by mouth daily.    [provider]  nitroGLYCERIN (NITROSTAT) 0.4 MG SL tablet Place 1 tablet (0.4 mg total) under the tongue every 5 (five) minutes as needed for chest pain. 08/22/20 12/23/22  Georgeanna Lea, MD  nystatin (MYCOSTATIN/NYSTOP) powder Apply 1 Application topically 2 (two) times daily as needed (yeast). 07/31/22   [provider]  pantoprazole (PROTONIX) 40 MG tablet Take 40  mg by mouth 2 (two) times daily. 11/14/16   [provider]  potassium chloride (MICRO-K) 10 MEQ CR capsule Take 10 mEq by mouth daily. 11/20/14   [provider]  rOPINIRole (REQUIP) 0.25 MG tablet Take 0.25 mg by mouth at bedtime.    [provider]  rosuvastatin (CRESTOR) 40 MG tablet Take 40 mg by mouth daily.    [provider]  sitaGLIPtin (JANUVIA) 100 MG tablet Take 100 mg by mouth daily. 03/11/22   [provider]  spironolactone (ALDACTONE) 25 MG tablet Take 0.5  tablets (12.5 mg total) by mouth daily. 04/29/22   Georgeanna Lea, MD  Vitamin D, Ergocalciferol, (DRISDOL) 50000 UNITS CAPS capsule Take 50,000 Units by mouth every Sunday. No specific days    [provider]      Allergies    Codeine, Levofloxacin, Penicillins, and Tape    Review of Systems   Review of Systems  Constitutional:  Positive for chills and fatigue. Negative for diaphoresis and fever.  HENT:  Negative for congestion.   Respiratory:  Positive for cough and shortness of breath. Negative for chest tightness and wheezing.   Cardiovascular:  Positive for leg swelling. Negative for chest pain and palpitations.  Gastrointestinal:  Positive for abdominal pain and nausea. Negative for constipation and vomiting.  Genitourinary:  Positive for flank pain. Dysuria: pain with urination but not burning. Musculoskeletal:  Positive for back pain. Negative for neck pain and neck stiffness.  Skin:  Negative for rash.  Neurological:  Negative for headaches.  Psychiatric/Behavioral:  Negative for agitation.   All other systems reviewed and are negative.   Physical Exam Updated Vital Signs BP (!) 136/53 (BP Location: Left Arm)   Pulse (!) 55   Temp 98.5 F (36.9 C)   Resp 19   SpO2 100%  Physical Exam Vitals and nursing note reviewed.  Constitutional:      General: She is not in acute distress.    Appearance: She is well-developed. She is not ill-appearing, toxic-appearing or diaphoretic.  HENT:     Head: Normocephalic and atraumatic.     Right Ear: External ear normal.     Left Ear: External ear normal.     Nose: Nose normal.     Mouth/Throat:     Mouth: Mucous membranes are moist.     Pharynx: No oropharyngeal exudate.  Eyes:     Conjunctiva/sclera: Conjunctivae normal.     Pupils: Pupils are equal, round, and reactive to light.  Cardiovascular:     Rate and Rhythm: Normal rate.  Pulmonary:     Effort: No respiratory distress.     Breath sounds: No stridor.   Abdominal:     General: Abdomen is flat. Bowel sounds are normal. There is no distension.     Tenderness: There is no abdominal tenderness. There is right CVA tenderness and left CVA tenderness. There is no guarding or rebound.  Musculoskeletal:     Cervical back: Normal range of motion and neck supple.  Skin:    General: Skin is warm.     Findings: No erythema or rash.  Neurological:     Mental Status: She is alert and oriented to person, place, and time.     Motor: No abnormal muscle tone.     Coordination: Coordination normal.     Deep Tendon Reflexes: Reflexes are normal and symmetric.  Psychiatric:        Mood and Affect: Mood normal.     ED Results /  Procedures / Treatments   Labs (all labs ordered are listed, but only abnormal results are displayed) Labs Reviewed  CBC WITH DIFFERENTIAL/PLATELET - Abnormal; Notable for the following components:      Result Value   RBC 3.55 (*)    Hemoglobin 10.9 (*)    HCT 35.5 (*)    All other components within normal limits  COMPREHENSIVE METABOLIC PANEL - Abnormal; Notable for the following components:   Glucose, Bld 185 (*)    BUN 24 (*)    All other components within normal limits  URINALYSIS, ROUTINE W REFLEX MICROSCOPIC - Abnormal; Notable for the following components:   APPearance HAZY (*)    Hgb urine dipstick LARGE (*)    Leukocytes,Ua LARGE (*)    Bacteria, UA RARE (*)    All other components within normal limits  RESP PANEL BY RT-PCR (RSV, FLU A&B, COVID)  RVPGX2  CULTURE, BLOOD (ROUTINE X 2)  CULTURE, BLOOD (ROUTINE X 2)  URINE CULTURE  LACTIC ACID, PLASMA  BRAIN NATRIURETIC PEPTIDE  LACTIC ACID, PLASMA  TROPONIN I (HIGH SENSITIVITY)  TROPONIN I (HIGH SENSITIVITY)    EKG EKG Interpretation Date/Time:  Friday February 13 2023 12:38:45 EST Ventricular Rate:  70 PR Interval:  226 QRS Duration:  102 QT Interval:  430 QTC Calculation: 464 R Axis:   -57  Text Interpretation: Normal sinus rhythm Pulmonary  disease pattern Incomplete right bundle branch block Left anterior fascicular block Abnormal ECG When compared with ECG of 23-Dec-2022 15:31, PREVIOUS ECG IS PRESENT when compared to prior, similar appearanc wityh more artifact No STEMI Confirmed by Theda Belfast ([id number]) on 02/13/2023 5:17:36 PM  Radiology CT Renal Stone Study  Result Date: 02/13/2023 CLINICAL DATA:  Flank pain and known right proximal ureteral stone EXAM: CT ABDOMEN AND PELVIS WITHOUT CONTRAST TECHNIQUE: Multidetector CT imaging of the abdomen and pelvis was performed following the standard protocol without IV contrast. RADIATION DOSE REDUCTION: This exam was performed according to the departmental dose-optimization program which includes automated exposure control, adjustment of the mA and/or kV according to patient size and/or use of iterative reconstruction technique. COMPARISON:  02/11/2023 FINDINGS: Lower chest: No acute abnormality. Hepatobiliary: No focal liver abnormality is seen. Status post cholecystectomy. No biliary dilatation. Pancreas: Unremarkable. No pancreatic ductal dilatation or surrounding inflammatory changes. Spleen: Normal in size without focal abnormality. Adrenals/Urinary Tract: Adrenal glands show a left adrenal adenoma stable in appearance from the prior exam. This measures up to 18 mm and is been seen on multiple previous studies. Left kidney demonstrates nonobstructing renal calculi measuring up to 8 mm. Left ureter is within normal limits. Right kidney demonstrates multiple renal calculi stable in appearance from the prior exam. Additionally a proximal right ureteral stone is noted measuring 8-9 mm. Mild fullness of the right renal pelvis is noted similar to that seen on the prior exam. The more distal right ureter is unremarkable. The bladder is decompressed. Small left renal cyst is noted stable from the prior study. Stomach/Bowel: Scattered diverticular change of the colon is noted without evidence of  diverticulitis. No obstructive changes are seen. The appendix has been surgically removed. Small bowel and stomach are unremarkable. Vascular/Lymphatic: Aortic atherosclerosis. No enlarged abdominal or pelvic lymph nodes. Reproductive: Status post hysterectomy. No adnexal masses. Other: No abdominal wall hernia or abnormality. No abdominopelvic ascites. Musculoskeletal: No acute or significant osseous findings. IMPRESSION: Bilateral nonobstructing renal calculi stable from the prior exam. Stable 8-9 mm proximal right ureteral stone with mild fullness of the right renal pelvis.  Stable left adrenal lesion consistent with adenoma. This is been stable over multiple previous exams and no further follow-up is recommended. Electronically Signed   By: Alcide Clever M.D.   On: 02/13/2023 20:48   DG Chest Portable 1 View  Result Date: 02/13/2023 CLINICAL DATA:  Cough. EXAM: PORTABLE CHEST 1 VIEW COMPARISON:  Chest radiograph dated 01/15/2023 FINDINGS: Background of emphysema. No focal consolidation, pleural effusion, pneumothorax. Mild cardiomegaly. Atherosclerotic calcification of the aorta. No acute osseous pathology. IMPRESSION: 1. No active disease. 2. Emphysema. 3. Mild cardiomegaly. Electronically Signed   By: Elgie Collard M.D.   On: 02/13/2023 20:04    From several days ago: EXAM: EXAM DATE: 02/11/2023. PROCEDURE: CT ABD-PELV W/IV CM  CLINICAL HISTORY: R31.0 GROSS HEMATURIA  COMPARISON: 09/11/2020.  TECHNIQUE: Helical CT images of the abdomen and pelvis were performed utilizing routine protocol with intravenous contrast material. Multiplanar reformations were obtained.  Dose reduction techniques were used including intermediate exposure control (AEC),iterative reconstruction technique, and/or mA and/or KV dose adjustments based on patient's size.  FINDINGS: Examination is somewhat limited due to the timing of the contrast. There is no portal venous phase imaging obtained.  Coronary artery  atherosclerotic vascular calcifications. There is a small calcification in the liver.  Cholecystectomy.  Spleen and pancreas are unremarkable. There is a stable sized left adrenal adenoma measuring 2.3 cm.  There is a density in the right proximal kidney likely representing a right proximal ureteral calculus measuring 8 mm. Mild right hydroureteronephrosis. Dense material in bilateral renal calices and pelvis is likely due to bilateral additional nonobstructive nephrolithiasis however these could represent early excretion of contrast. Bilateral renal cysts. No left hydronephrosis.  Moderate volume of stool in the colon greatest in the rectum. Appendix is not visualized.  Colonic diverticulosis.  Atherosclerotic vascular calcifications.  Scattered subcentimeter nonspecific lymph nodes.  Urinary bladder is decompressed.  Hysterectomy.  IMPRESSION: 1. Examination is somewhat limited due to the timing of the contrast. There is no portal venous phase imaging obtained. 2. Mild right hydroureteronephrosis secondary to proximal right ureteral calculus measuring 8 mm. 3. Dense material in bilateral renal calices/pelvises which may be due to excretion of contrast or nonobstructive nephrolithiasis. 4. Bilateral renal cysts.  Electronically Signed By: [place] Dixon On: 02/13/2023 4:55 AM     Procedures Procedures    Medications Ordered in ED Medications  morphine (PF) 4 MG/ML injection 4 mg (4 mg Intravenous Given 02/13/23 1811)  ondansetron (ZOFRAN) injection 4 mg (4 mg Intravenous Given 02/13/23 1810)  cefTRIAXone (ROCEPHIN) 1 g in sodium chloride 0.9 % 100 mL IVPB (0 g Intravenous Stopped 02/13/23 2209)    ED Course/ Medical Decision Making/ A&P                                 Medical Decision Making Amount and/or Complexity of Data Reviewed Labs: ordered. Radiology: ordered.  Risk Prescription drug management. Decision regarding hospitalization.    LEANORA LAMBERG is a 89 y.o. female with a past medical history significant for CHF, COPD on home oxygen, diabetes, CAD, atrial fibrillation on Eliquis therapy, and recently diagnosed kidney stone with recurrent urinary tract infections who presents with severe back pain and pain with urination.  According to patient, for the last week or so she has had pain developing in her low back especially on the left side more than the right side.  She reports she has had pain with urination but denies  burning.  She says that she has been on antibiotics for UTI recently and had a CT scan several days ago and was told she had an 11 mm stone.  She was scheduled to see a urologist in December but over the last few days the pain has rapidly worsened to now 10 out of 10 in severity.  She is having nausea but no vomiting.  Denies constipation or diarrhea.  Reports some cough and some shortness of breath but she is always on oxygen she reports.  Denies chest pain or palpitations.  Reports some edema in her legs that is chronic but waxing and waning.  She is concerned about all of this.  On exam, patient is very uncomfortable.  She has tenderness in her bilateral CVA. midline was not tender.  Abdomen nontender and there were good bowel sounds.  Lungs had some faint rales but chest was nontender.  Good pulse in extremities.  Legs have edema in lower extremities.  As we cannot see the CT results from [facility] at this time, we will order a CT stone study to look for this large stone.  Her urinalysis from triage did show large blood as well as some leukocytes and bacteria.  Suspect possible infected stone given the pain in the suprapubic area and with urination.  Her metabolic panel did not show AKI and her CBC did not show leukocytosis.  Due to her chills she was also describing, will get a lactic acid, will get blood cultures, with the shortness of breath and cough will get COVID swab, troponin, and BNP given her heart failure  history.  Will get chest x-ray.  Anticipate reassessment after workup is completed to determine disposition.  If there is a large stone that may be infected, anticipate discussion with urology.  Patient and family state that she has tolerated morphine in the past so we will order this for her with some nausea medicine.  7:29 PM I was able to find the patient's imaging results as seen in the chart above that indicated there was an 8 mm stone on the right proximal ureter.  Due to her pain being on the left side now and the pain with urination I am concerned about infected stone or other problem.  Will get a stone study to see if it is moved or if there is another 1 on the other side.  Patient agrees with getting a new CT scan so that we can know what is happening tonight prior to getting a disposition.  9:14 PM CT scan returned showing similar appearance of the recent kidney stone with some fullness of the renal pelvis.  Will call urology given the patient's severe pain.  9:26 PM Spoke to Dr. Margo Aye with urology who agrees she needs antibiotics, medicine admission, and he will come see her to determine if she needs a stent tonight.  9:30 PM Chart review shows patient was tolerated a cephalosporin with previous Rocephin so we will give Rocephin.         Final Clinical Impression(s) / ED Diagnoses Final diagnoses:  Kidney stone on right side  Urinary tract infection with hematuria, site unspecified  Acute bilateral back pain, unspecified back location     Clinical Impression: 1. Kidney stone on right side   2. Urinary tract infection with hematuria, site unspecified   3. Acute bilateral back pain, unspecified back location     Disposition: Admit  This note was prepared with assistance of Dragon voice recognition software.  Occasional wrong-word or sound-a-like substitutions may have occurred due to the inherent limitations of voice recognition software.      Quadry Kampa, Canary Brim, MD 02/13/23 [phone]

## 2023-02-14 ENCOUNTER — Inpatient Hospital Stay (HOSPITAL_COMMUNITY): Payer: PPO

## 2023-02-14 ENCOUNTER — Encounter (HOSPITAL_COMMUNITY): Payer: Self-pay | Admitting: Family Medicine

## 2023-02-14 ENCOUNTER — Inpatient Hospital Stay (HOSPITAL_COMMUNITY): Payer: PPO | Admitting: Anesthesiology

## 2023-02-14 ENCOUNTER — Encounter (HOSPITAL_COMMUNITY)
Admission: EM | Disposition: A | Discharge status: Home / Self Care | Payer: Self-pay | Source: Home / Self Care | Attending: Internal Medicine

## 2023-02-14 ENCOUNTER — Other Ambulatory Visit: Payer: Self-pay

## 2023-02-14 DIAGNOSIS — E782 Mixed hyperlipidemia: Secondary | ICD-10-CM | POA: Diagnosis present

## 2023-02-14 DIAGNOSIS — I82441 Acute embolism and thrombosis of right tibial vein: Secondary | ICD-10-CM | POA: Diagnosis not present

## 2023-02-14 DIAGNOSIS — F5104 Psychophysiologic insomnia: Secondary | ICD-10-CM | POA: Diagnosis present

## 2023-02-14 DIAGNOSIS — M549 Dorsalgia, unspecified: Secondary | ICD-10-CM

## 2023-02-14 DIAGNOSIS — I5032 Chronic diastolic (congestive) heart failure: Secondary | ICD-10-CM | POA: Diagnosis present

## 2023-02-14 DIAGNOSIS — Z6841 Body Mass Index (BMI) 40.0 and over, adult: Secondary | ICD-10-CM | POA: Diagnosis not present

## 2023-02-14 DIAGNOSIS — R319 Hematuria, unspecified: Secondary | ICD-10-CM

## 2023-02-14 DIAGNOSIS — N200 Calculus of kidney: Secondary | ICD-10-CM | POA: Diagnosis present

## 2023-02-14 DIAGNOSIS — Z1152 Encounter for screening for COVID-19: Secondary | ICD-10-CM | POA: Diagnosis not present

## 2023-02-14 DIAGNOSIS — J9611 Chronic respiratory failure with hypoxia: Secondary | ICD-10-CM | POA: Diagnosis present

## 2023-02-14 DIAGNOSIS — I2510 Atherosclerotic heart disease of native coronary artery without angina pectoris: Secondary | ICD-10-CM

## 2023-02-14 DIAGNOSIS — M79662 Pain in left lower leg: Secondary | ICD-10-CM | POA: Diagnosis not present

## 2023-02-14 DIAGNOSIS — Z9981 Dependence on supplemental oxygen: Secondary | ICD-10-CM | POA: Diagnosis not present

## 2023-02-14 DIAGNOSIS — N202 Calculus of kidney with calculus of ureter: Secondary | ICD-10-CM | POA: Diagnosis present

## 2023-02-14 DIAGNOSIS — N390 Urinary tract infection, site not specified: Secondary | ICD-10-CM | POA: Diagnosis present

## 2023-02-14 DIAGNOSIS — Z66 Do not resuscitate: Secondary | ICD-10-CM | POA: Diagnosis present

## 2023-02-14 DIAGNOSIS — J4489 Other specified chronic obstructive pulmonary disease: Secondary | ICD-10-CM | POA: Diagnosis present

## 2023-02-14 DIAGNOSIS — N179 Acute kidney failure, unspecified: Secondary | ICD-10-CM | POA: Diagnosis present

## 2023-02-14 DIAGNOSIS — I130 Hypertensive heart and chronic kidney disease with heart failure and stage 1 through stage 4 chronic kidney disease, or unspecified chronic kidney disease: Secondary | ICD-10-CM | POA: Diagnosis present

## 2023-02-14 DIAGNOSIS — N201 Calculus of ureter: Secondary | ICD-10-CM

## 2023-02-14 DIAGNOSIS — N1831 Chronic kidney disease, stage 3a: Secondary | ICD-10-CM | POA: Diagnosis present

## 2023-02-14 DIAGNOSIS — I482 Chronic atrial fibrillation, unspecified: Secondary | ICD-10-CM | POA: Diagnosis present

## 2023-02-14 DIAGNOSIS — J431 Panlobular emphysema: Secondary | ICD-10-CM | POA: Diagnosis present

## 2023-02-14 DIAGNOSIS — I480 Paroxysmal atrial fibrillation: Secondary | ICD-10-CM | POA: Diagnosis present

## 2023-02-14 DIAGNOSIS — E1122 Type 2 diabetes mellitus with diabetic chronic kidney disease: Secondary | ICD-10-CM | POA: Diagnosis present

## 2023-02-14 DIAGNOSIS — G2581 Restless legs syndrome: Secondary | ICD-10-CM | POA: Diagnosis present

## 2023-02-14 DIAGNOSIS — E1169 Type 2 diabetes mellitus with other specified complication: Secondary | ICD-10-CM | POA: Diagnosis present

## 2023-02-14 DIAGNOSIS — Z87891 Personal history of nicotine dependence: Secondary | ICD-10-CM | POA: Diagnosis not present

## 2023-02-14 DIAGNOSIS — E1140 Type 2 diabetes mellitus with diabetic neuropathy, unspecified: Secondary | ICD-10-CM | POA: Diagnosis present

## 2023-02-14 HISTORY — PX: CYSTOSCOPY WITH RETROGRADE PYELOGRAM, URETEROSCOPY AND STENT PLACEMENT: SHX5789

## 2023-02-14 LAB — GLUCOSE, CAPILLARY
Glucose-Capillary: 163 mg/dL — ABNORMAL HIGH (ref 70–99)
Glucose-Capillary: 164 mg/dL — ABNORMAL HIGH (ref 70–99)
Glucose-Capillary: 137 mg/dL — ABNORMAL HIGH (ref 70–99)
Glucose-Capillary: 138 mg/dL — ABNORMAL HIGH (ref 70–99)
Glucose-Capillary: 118 mg/dL — ABNORMAL HIGH (ref 70–99)
Glucose-Capillary: 110 mg/dL — ABNORMAL HIGH (ref 70–99)
Glucose-Capillary: 179 mg/dL — ABNORMAL HIGH (ref 70–99)
Glucose-Capillary: 173 mg/dL — ABNORMAL HIGH (ref 70–99)

## 2023-02-14 LAB — HEMOGLOBIN A1C
Hgb A1c MFr Bld: 7 % — ABNORMAL HIGH (ref 4.8–5.6)
Mean Plasma Glucose: 154.2 mg/dL

## 2023-02-14 LAB — URINE CULTURE: Culture: NO GROWTH

## 2023-02-14 SURGERY — CYSTOURETEROSCOPY, WITH RETROGRADE PYELOGRAM AND STENT INSERTION
Anesthesia: General | Site: Ureter | Laterality: Right

## 2023-02-14 MED ORDER — ACETAMINOPHEN 325 MG PO TABS
650.0000 mg | ORAL_TABLET | Freq: Four times a day (QID) | ORAL | Status: DC | PRN
  Administered 2023-02-14 – 2023-02-19 (×5): 650 mg via ORAL
  Filled 2023-02-14 (×4): qty 2

## 2023-02-14 MED ORDER — HYDROMORPHONE HCL 1 MG/ML IJ SOLN: 0.5000 mg | INTRAMUSCULAR | Status: DC | PRN

## 2023-02-14 MED ORDER — ALBUTEROL SULFATE HFA 108 (90 BASE) MCG/ACT IN AERS: 2.0000 | INHALATION_SPRAY | Freq: Four times a day (QID) | RESPIRATORY_TRACT | Status: DC | PRN

## 2023-02-14 MED ORDER — LACTATED RINGERS IV SOLN
INTRAVENOUS | Status: DC
Start: 2023-02-14 — End: 2023-02-14

## 2023-02-14 MED ORDER — MOMETASONE FURO-FORMOTEROL FUM 200-5 MCG/ACT IN AERO
2.0000 | INHALATION_SPRAY | Freq: Two times a day (BID) | RESPIRATORY_TRACT | Status: DC
  Administered 2023-02-15 – 2023-02-19 (×8): 2 via RESPIRATORY_TRACT
  Filled 2023-02-14: qty 8.8

## 2023-02-14 MED ORDER — ORAL CARE MOUTH RINSE: 15.0000 mL | Freq: Once | OROMUCOSAL | Status: AC

## 2023-02-14 MED ORDER — STERILE WATER FOR IRRIGATION IR SOLN
Status: DC | PRN
Start: 2023-02-14 — End: 2023-02-14
  Administered 2023-02-14: 300 mL

## 2023-02-14 MED ORDER — SENNA 8.6 MG PO TABS
2.0000 | ORAL_TABLET | Freq: Every day | ORAL | Status: DC
  Administered 2023-02-14 – 2023-02-17 (×5): 17.2 mg via ORAL
  Filled 2023-02-14 (×6): qty 2

## 2023-02-14 MED ORDER — KETOROLAC TROMETHAMINE 15 MG/ML IJ SOLN
15.0000 mg | Freq: Four times a day (QID) | INTRAMUSCULAR | Status: DC | PRN
  Administered 2023-02-14 – 2023-02-15 (×2): 15 mg via INTRAVENOUS
  Filled 2023-02-14 (×2): qty 1

## 2023-02-14 MED ORDER — CLONAZEPAM 0.5 MG PO TABS
1.0000 mg | ORAL_TABLET | Freq: Every day | ORAL | Status: DC
  Administered 2023-02-14 – 2023-02-18 (×5): 1 mg via ORAL
  Filled 2023-02-14 (×5): qty 2

## 2023-02-14 MED ORDER — CHLORHEXIDINE GLUCONATE 0.12 % MT SOLN
OROMUCOSAL | Status: AC
  Administered 2023-02-14: 15 mL via OROMUCOSAL
  Filled 2023-02-14: qty 15

## 2023-02-14 MED ORDER — GABAPENTIN 100 MG PO CAPS
100.0000 mg | ORAL_CAPSULE | Freq: Every day | ORAL | Status: DC
  Administered 2023-02-14 – 2023-02-18 (×5): 100 mg via ORAL
  Filled 2023-02-14 (×5): qty 1

## 2023-02-14 MED ORDER — CITALOPRAM HYDROBROMIDE 20 MG PO TABS
20.0000 mg | ORAL_TABLET | Freq: Every day | ORAL | Status: DC
  Administered 2023-02-14 – 2023-02-19 (×6): 20 mg via ORAL
  Filled 2023-02-14 (×6): qty 1

## 2023-02-14 MED ORDER — PROPOFOL 10 MG/ML IV BOLUS
INTRAVENOUS | Status: DC | PRN
  Administered 2023-02-14: 100 mg via INTRAVENOUS

## 2023-02-14 MED ORDER — ROPINIROLE HCL 0.25 MG PO TABS
0.2500 mg | ORAL_TABLET | Freq: Every day | ORAL | Status: DC
  Administered 2023-02-14 – 2023-02-18 (×5): 0.25 mg via ORAL
  Filled 2023-02-14 (×6): qty 1

## 2023-02-14 MED ORDER — INSULIN ASPART 100 UNIT/ML IJ SOLN
0.0000 [IU] | INTRAMUSCULAR | Status: DC
  Administered 2023-02-14: 2 [IU] via SUBCUTANEOUS
  Administered 2023-02-14 – 2023-02-15 (×4): 3 [IU] via SUBCUTANEOUS
  Administered 2023-02-15: 5 [IU] via SUBCUTANEOUS
  Administered 2023-02-15 (×3): 3 [IU] via SUBCUTANEOUS
  Administered 2023-02-15: 2 [IU] via SUBCUTANEOUS
  Administered 2023-02-16: 3 [IU] via SUBCUTANEOUS
  Administered 2023-02-16: 5 [IU] via SUBCUTANEOUS
  Administered 2023-02-16 (×2): 3 [IU] via SUBCUTANEOUS
  Administered 2023-02-16: 5 [IU] via SUBCUTANEOUS
  Administered 2023-02-16: 3 [IU] via SUBCUTANEOUS
  Administered 2023-02-17: 2 [IU] via SUBCUTANEOUS
  Administered 2023-02-17 (×2): 5 [IU] via SUBCUTANEOUS
  Administered 2023-02-17: 8 [IU] via SUBCUTANEOUS
  Administered 2023-02-17: 5 [IU] via SUBCUTANEOUS
  Administered 2023-02-17: 3 [IU] via SUBCUTANEOUS
  Administered 2023-02-17: 8 [IU] via SUBCUTANEOUS
  Administered 2023-02-18 (×3): 5 [IU] via SUBCUTANEOUS
  Administered 2023-02-18: 3 [IU] via SUBCUTANEOUS
  Administered 2023-02-18 – 2023-02-19 (×2): 5 [IU] via SUBCUTANEOUS
  Administered 2023-02-19: 2 [IU] via SUBCUTANEOUS
  Administered 2023-02-19: 3 [IU] via SUBCUTANEOUS
  Administered 2023-02-19: 2 [IU] via SUBCUTANEOUS

## 2023-02-14 MED ORDER — FENTANYL CITRATE (PF) 100 MCG/2ML IJ SOLN
25.0000 ug | INTRAMUSCULAR | Status: DC | PRN
Start: 2023-02-14 — End: 2023-02-14

## 2023-02-14 MED ORDER — INSULIN ASPART 100 UNIT/ML IJ SOLN
0.0000 [IU] | INTRAMUSCULAR | Status: DC | PRN
Start: 2023-02-14 — End: 2023-02-14

## 2023-02-14 MED ORDER — AMIODARONE HCL 200 MG PO TABS
200.0000 mg | ORAL_TABLET | Freq: Every day | ORAL | Status: DC
  Administered 2023-02-14 – 2023-02-19 (×6): 200 mg via ORAL
  Filled 2023-02-14 (×6): qty 1

## 2023-02-14 MED ORDER — SODIUM CHLORIDE 0.9 % IV SOLN
1.0000 g | Freq: Every day | INTRAVENOUS | Status: DC
  Administered 2023-02-14 – 2023-02-15 (×2): 1 g via INTRAVENOUS
  Filled 2023-02-14 (×2): qty 10

## 2023-02-14 MED ORDER — PANTOPRAZOLE SODIUM 40 MG PO TBEC
40.0000 mg | DELAYED_RELEASE_TABLET | Freq: Two times a day (BID) | ORAL | Status: DC
  Administered 2023-02-14 – 2023-02-19 (×11): 40 mg via ORAL
  Filled 2023-02-14 (×11): qty 1

## 2023-02-14 MED ORDER — FUROSEMIDE 40 MG PO TABS
40.0000 mg | ORAL_TABLET | Freq: Every day | ORAL | Status: DC
  Administered 2023-02-15 – 2023-02-16 (×2): 40 mg via ORAL
  Filled 2023-02-14 (×2): qty 1

## 2023-02-14 MED ORDER — ALBUTEROL SULFATE (2.5 MG/3ML) 0.083% IN NEBU: 2.5000 mg | INHALATION_SOLUTION | Freq: Four times a day (QID) | RESPIRATORY_TRACT | Status: DC | PRN

## 2023-02-14 MED ORDER — STERILE WATER FOR IRRIGATION IR SOLN
Status: DC | PRN
  Administered 2023-02-14: 3000 mL

## 2023-02-14 MED ORDER — FENTANYL CITRATE (PF) 250 MCG/5ML IJ SOLN
INTRAMUSCULAR | Status: DC | PRN
  Administered 2023-02-14 (×2): 25 ug via INTRAVENOUS

## 2023-02-14 MED ORDER — FERROUS SULFATE 325 (65 FE) MG PO TBEC: 325.0000 mg | DELAYED_RELEASE_TABLET | Freq: Every day | ORAL | Status: DC

## 2023-02-14 MED ORDER — MULTIVITAMINS PO CAPS: 1.0000 | ORAL_CAPSULE | Freq: Every day | ORAL | Status: DC

## 2023-02-14 MED ORDER — ACETAMINOPHEN 650 MG RE SUPP: 650.0000 mg | Freq: Four times a day (QID) | RECTAL | Status: DC | PRN

## 2023-02-14 MED ORDER — LIDOCAINE 2% (20 MG/ML) 5 ML SYRINGE
INTRAMUSCULAR | Status: DC | PRN
  Administered 2023-02-14: 60 mg via INTRAVENOUS

## 2023-02-14 MED ORDER — FENTANYL CITRATE (PF) 250 MCG/5ML IJ SOLN
INTRAMUSCULAR | Status: AC
  Filled 2023-02-14: qty 5

## 2023-02-14 MED ORDER — HYDROMORPHONE HCL 1 MG/ML IJ SOLN
0.5000 mg | INTRAMUSCULAR | Status: DC | PRN
  Administered 2023-02-14 – 2023-02-18 (×3): 0.5 mg via INTRAVENOUS
  Filled 2023-02-14 (×3): qty 0.5

## 2023-02-14 MED ORDER — AMISULPRIDE (ANTIEMETIC) 5 MG/2ML IV SOLN: 10.0000 mg | Freq: Once | INTRAVENOUS | Status: DC | PRN

## 2023-02-14 MED ORDER — ROSUVASTATIN CALCIUM 20 MG PO TABS
40.0000 mg | ORAL_TABLET | Freq: Every day | ORAL | Status: DC
  Administered 2023-02-14 – 2023-02-19 (×6): 40 mg via ORAL
  Filled 2023-02-14 (×6): qty 2

## 2023-02-14 MED ORDER — FERROUS SULFATE 325 (65 FE) MG PO TABS
325.0000 mg | ORAL_TABLET | Freq: Every day | ORAL | Status: DC
  Administered 2023-02-15 – 2023-02-19 (×5): 325 mg via ORAL
  Filled 2023-02-14 (×5): qty 1

## 2023-02-14 MED ORDER — TRAZODONE HCL 100 MG PO TABS
100.0000 mg | ORAL_TABLET | Freq: Every evening | ORAL | Status: DC | PRN
  Administered 2023-02-15 – 2023-02-16 (×2): 100 mg via ORAL
  Filled 2023-02-14 (×2): qty 1

## 2023-02-14 MED ORDER — KCL IN DEXTROSE-NACL 20-5-0.9 MEQ/L-%-% IV SOLN
INTRAVENOUS | Status: DC
  Filled 2023-02-14 (×2): qty 1000

## 2023-02-14 MED ORDER — ONDANSETRON HCL 4 MG/2ML IJ SOLN: 4.0000 mg | Freq: Four times a day (QID) | INTRAMUSCULAR | Status: DC | PRN

## 2023-02-14 MED ORDER — SODIUM CHLORIDE 0.9% FLUSH
3.0000 mL | Freq: Two times a day (BID) | INTRAVENOUS | Status: DC
  Administered 2023-02-14 – 2023-02-19 (×11): 3 mL via INTRAVENOUS

## 2023-02-14 MED ORDER — ONDANSETRON HCL 4 MG/2ML IJ SOLN
4.0000 mg | Freq: Once | INTRAMUSCULAR | Status: DC | PRN
Start: 2023-02-14 — End: 2023-02-14

## 2023-02-14 MED ORDER — ACETAMINOPHEN 10 MG/ML IV SOLN: 1000.0000 mg | Freq: Once | INTRAVENOUS | Status: DC | PRN

## 2023-02-14 MED ORDER — PROPOFOL 10 MG/ML IV BOLUS
INTRAVENOUS | Status: AC
  Filled 2023-02-14: qty 20

## 2023-02-14 MED ORDER — ONDANSETRON HCL 4 MG/2ML IJ SOLN
INTRAMUSCULAR | Status: DC | PRN
  Administered 2023-02-14: 4 mg via INTRAVENOUS

## 2023-02-14 MED ORDER — KETOROLAC TROMETHAMINE 15 MG/ML IJ SOLN
15.0000 mg | Freq: Once | INTRAMUSCULAR | Status: AC
  Administered 2023-02-14: 15 mg via INTRAVENOUS
  Filled 2023-02-14: qty 1

## 2023-02-14 MED ORDER — CHLORHEXIDINE GLUCONATE 0.12 % MT SOLN
15.0000 mL | Freq: Once | OROMUCOSAL | Status: AC
Start: 2023-02-14 — End: 2023-02-14

## 2023-02-14 MED ORDER — LACTATED RINGERS IV SOLN: INTRAVENOUS | Status: DC | PRN

## 2023-02-14 MED ORDER — IOHEXOL 300 MG/ML  SOLN
INTRAMUSCULAR | Status: DC | PRN
  Administered 2023-02-14: 4 mL via URETHRAL

## 2023-02-14 SURGICAL SUPPLY — 24 items
BAG COUNTER SPONGE SURGICOUNT (BAG) ×1 IMPLANT
BAG URO CATCHER STRL LF (MISCELLANEOUS) ×1 IMPLANT
CATH URETL OPEN END 6FR 70 (CATHETERS) IMPLANT
COVER SURGICAL LIGHT HANDLE (MISCELLANEOUS) IMPLANT
ELECT REM PT RETURN 9FT ADLT (ELECTROSURGICAL)
ELECTRODE REM PT RTRN 9FT ADLT (ELECTROSURGICAL) IMPLANT
FIBER LASER TRAC TIP (UROLOGICAL SUPPLIES) IMPLANT
GLOVE BIO SURGEON STRL SZ7.5 (GLOVE) ×1 IMPLANT
GOWN STRL REUS W/ TWL LRG LVL3 (GOWN DISPOSABLE) ×1 IMPLANT
GOWN STRL REUS W/ TWL XL LVL3 (GOWN DISPOSABLE) IMPLANT
GOWN STRL REUS W/TWL LRG LVL3 (GOWN DISPOSABLE) ×2
GOWN STRL REUS W/TWL XL LVL3 (GOWN DISPOSABLE) ×1
GUIDEWIRE STR DUAL SENSOR (WIRE) ×1 IMPLANT
IV NS 1000ML (IV SOLUTION)
IV NS 1000ML BAXH (IV SOLUTION) ×1 IMPLANT
IV NS IRRIG 3000ML ARTHROMATIC (IV SOLUTION) ×1 IMPLANT
KIT TURNOVER KIT B (KITS) ×1 IMPLANT
MANIFOLD NEPTUNE II (INSTRUMENTS) ×2 IMPLANT
NS IRRIG 1000ML POUR BTL (IV SOLUTION) ×2 IMPLANT
PACK CYSTO (CUSTOM PROCEDURE TRAY) ×1 IMPLANT
SET IRRIG Y TYPE TUR BLADDER L (SET/KITS/TRAYS/PACK) IMPLANT
SOL PREP POV-IOD 4OZ 10% (MISCELLANEOUS) ×1 IMPLANT
STENT URET 6FRX24 CONTOUR (STENTS) IMPLANT
TUBE CONNECTING 12X1/4 (SUCTIONS) IMPLANT

## 2023-02-14 NOTE — Transfer of Care (Signed)
Immediate Anesthesia Transfer of Care Note  Patient: Danielle [name]  Procedure(s) Performed: CYSTOSCOPY WITH RETROGRADE PYELOGRAM, URETEROSCOPY AND STENT PLACEMENT (Right: Ureter)  Patient Location: PACU  Anesthesia Type:General  Level of Consciousness: drowsy and patient cooperative  Airway & Oxygen Therapy: Patient Spontanous Breathing and Patient connected to nasal cannula oxygen  Post-op Assessment: Report given to RN and Post -op Vital signs reviewed and stable  Post vital signs: Reviewed and stable  Last Vitals:  Vitals Value Taken Time  BP 146/55 02/14/23 1146  Temp    Pulse 57 02/14/23 1149  Resp 13 02/14/23 1149  SpO2 97 % 02/14/23 1149  Vitals shown include unfiled device data.  Last Pain:  Vitals:   02/14/23 1014  TempSrc: Oral  PainSc: 0-No pain         Complications: No notable events documented.

## 2023-02-14 NOTE — ED Provider Notes (Signed)
  Provider Note MRN:  [id number]  Arrival date & time: 02/14/23    ED Course and Medical Decision Making  Assumed care from Dr. Rush Landmark at shift change.  9 mm kidney stone with concern for concomitant urinary tract infection.  Urology consulted, plan is for hospitalist admission.  Procedures  Final Clinical Impressions(s) / ED Diagnoses     ICD-10-CM   1. Kidney stone on right side  N20.0     2. Urinary tract infection with hematuria, site unspecified  N39.0    R31.9     3. Acute bilateral back pain, unspecified back location  M54.9       ED Discharge Orders     None       Discharge Instructions   None     Elmer Sow. Pilar Plate, MD [facility] Emergency Medicine [facility] [email or web address]    Sabas Sous, MD 02/14/23 [phone]

## 2023-02-14 NOTE — Anesthesia Procedure Notes (Signed)
Procedure Name: LMA Insertion Date/Time: 02/14/2023 11:04 AM  Performed by: Dairl Ponder, CRNAPre-anesthesia Checklist: Patient identified, Emergency Drugs available, Suction available and Patient being monitored Patient Re-evaluated:Patient Re-evaluated prior to induction Oxygen Delivery Method: Circle System Utilized Preoxygenation: Pre-oxygenation with 100% oxygen Induction Type: IV induction Ventilation: Mask ventilation without difficulty LMA: LMA inserted LMA Size: 4.0 Number of attempts: 1 Airway Equipment and Method: Bite block Placement Confirmation: positive ETCO2 Tube secured with: Tape Dental Injury: Teeth and Oropharynx as per pre-operative assessment

## 2023-02-14 NOTE — ED Notes (Signed)
Called 6N, RN has no questions, transport called for pt.

## 2023-02-14 NOTE — Anesthesia Postprocedure Evaluation (Signed)
Anesthesia Post Note  Patient: Danielle [name]  Procedure(s) Performed: CYSTOSCOPY WITH RETROGRADE PYELOGRAM, URETEROSCOPY AND STENT PLACEMENT (Right: Ureter)     Patient location during evaluation: PACU Anesthesia Type: General Level of consciousness: awake Pain management: pain level controlled Vital Signs Assessment: post-procedure vital signs reviewed and stable Respiratory status: spontaneous breathing, nonlabored ventilation and respiratory function stable Cardiovascular status: blood pressure returned to baseline and stable Postop Assessment: no apparent nausea or vomiting Anesthetic complications: no   No notable events documented.  Last Vitals:  Vitals:   02/14/23 1230 02/14/23 1307  BP: (!) 141/44 (!) 126/52  Pulse: (!) 55 (!) 58  Resp: 13 18  Temp: 36.6 C 36.8 C  SpO2: 96% 97%    Last Pain:  Vitals:   02/14/23 1510  TempSrc:   PainSc: 7                  Eathen Budreau P Remmie Bembenek

## 2023-02-14 NOTE — Op Note (Signed)
OPERATIVE NOTE   Patient Name: Danielle [name]  MRN: [id number]   Date of Procedure: 111/16/2024   Preoperative diagnosis:  Right proximal ureteral stone Poor pain control ? UTI  Postoperative diagnosis:  same  Procedure:  Cystoscopy, right retrograde with interpretation, placement of right 6x24cm dj stent (tether removed  Attending: Concepcion Living, MD  Anesthesia: general/LMA  Estimated blood loss: minimal  Fluids: Per anesthesia record  Drains: 16 Fr Foley  Specimens: none  Antibiotics: on scheduled abx  Findings:  moderate diffuse bladder urothelial inflammatory change, marked trabeculation and cellule formation. Filling defect proximal ureter c/w stone; moderate proximal dilation  Indications:  Right proximal stone with poor pain control and possible UTI  Description of Procedure:  The patient was brought to the operating room where she was correctly identified and then underwent satisfactory induction of general anesthesia via LMA.  Patient was positioned in the modified dorsolithotomy position and her external genitalia were prepped and draped in usual sterile fashion.  21 French panendoscope was subsequently inserted per urethra.  The bladder was carefully inspected.  There was noted to be relatively diffuse moderate bladder urothelial inflammatory changes consistent with multiple recent UTIs as per history.  There were no lesions concerning for bladder tumor.  There was moderate bladder trabeculation and some cellule formation noted as well.  Ureteral openings appeared normal.  Right retrograde ureteropyelogram was subsequently performed with a 5 [place] open ended catheter.  This revealed a normal distal ureter.  There was noted to be a filling defect in the proximal ureter coinciding with the findings on the CT scan consistent with a 6 mm stone.  The proximal collecting system was mild-moderately dilated.  At this time under endoscopic and  fluoroscopic control I placed a sensor wire with a good coil obtained within the upper pole collecting system of the kidney.  Over this wire again under endoscopic and fluoroscopic control I was able to place easily a 6 x 24 cm double-J stent with tether removed.  Good position was obtained proximally and distally.  There was no significant purulent E flux.  The drainage from the stent appeared relatively clear.  Following stent placement the scope was removed.  A 16 French Foley catheter was in place with clear return and the procedure was terminated.  The patient tolerated the procedure well.  No apparent complications.  Complications: None  Condition: Stable, extubated, transferred to PACU  Plan: keep Foley min of 24 hrs.  May DC home when culture results available if she remains afebrile.  Will need to start low dose antibiotic suppression after completing current course eg. Bactrim RS (NOT DS) at bedtime WILL ARRANGE UROLOGY FU IN ABOUT 2 WEEKS

## 2023-02-14 NOTE — H&P (Signed)
History and Physical    Patient: Danielle [name] ZOX:[id number] DOB: May 25, 1933 DOA: 02/13/2023 DOS: the patient was seen and examined on 02/14/2023 PCP: Gordan Payment., MD  Patient coming from: Home Medical readiness/disposition: Anticipate patient will be ready for discharge within the next 48 hours pending clearance from urology postoperatively.  Anticipate discharge back to home  Chief Complaint:  Chief Complaint  Patient presents with   Abdominal Pain   Shortness of Breath   HPI: Danielle [name] is a 89 y.o. female with medical history significant of osteoarthritis, diastolic dysfunction with prior heart failure, reported history of CKD 3 although creatinine clearance and GFR at this time are normal, history of nonobstructive CAD with a 40% lesion in the RCA per cath in 2014, dysphagia, COPD, hypertension, dyslipidemia, iron deficiency anemia, sleep apnea with obesity and type 2 diabetes.  She also has a history of atrial fibrillation on chronic anticoagulation with Eliquis.  Patient has a history of renal calculi many years ago and for the past 2 months has been having UTI symptoms and has been treated with several antibiotics.  For the past week she has been on Macrobid which she has just completed.  She presented because of very severe back pain and dysuria and difficulty urinating.  CT renal stone study done in the ED revealed bilateral nonobstructing renal calculi stable from prior exam.  There was also a 8 to 9 mm proximal right ureteral stone with mild fullness of the right renal pelvis that is also stable but clearly causing symptoms.  Dr. Margo Aye with urology has seen the patient in consultation and concurs that patient's symptoms are not very well controlled and recommended medical team to admit the patient.  In addition urology plans to proceed with operative urinary tract drainage with DJ stent placement and recommended continuation of antibiotics for secondary urinary tract  infection.  Hospital service has been asked to evaluate the patient for admission.  Upon my discussion with the patient she concurs that she is still having left-sided back pain 8/10 but it has improved from last night.  She is not having any nausea or vomiting.  No difficulty breathing or chest pain.   Review of Systems: As mentioned in the history of present illness. All other systems reviewed and are negative. Past Medical History:  Diagnosis Date   Anginal pain (HCC)    Arthritis    Asthma    At high risk for falls 11/11/2017   Chest pain 11/09/2015   CHF (congestive heart failure) (HCC)    Chronic diastolic congestive heart failure (HCC) 11/11/2015   Chronic insomnia 10/24/2015   Chronic pain of both knees 05/14/2018   Chronic respiratory failure with hypoxia (HCC) 05/05/2017   CKD (chronic kidney disease) stage 3, GFR 30-59 ml/min (HCC) 05/07/2017   COPD (chronic obstructive pulmonary disease) (HCC)    Coronary artery disease    Coronary artery disease involving native coronary artery of native heart without angina pectoris 11/14/2014   Overview:  40% RCA a cardiac catheter in 2014   Current mild episode of major depressive disorder (HCC) 11/14/2014   Depression    DOE (dyspnea on exertion) 09/07/2018   Dysphagia 08/12/2018   Edema 08/12/2018   Elevated blood sugar 11/09/2015   Emphysema lung (HCC)    Flat feet, bilateral 05/30/2020   Gastroesophageal reflux disease without esophagitis 11/11/2017   GERD (gastroesophageal reflux disease)    Headache    History of hiatal hernia    Hyperlipidemia  Hypertension    Insomnia    Internal hemorrhoids 11/26/2018   Iron deficiency anemia 08/12/2018   Malaise and fatigue 08/14/2015   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/26/2018   Mesenteric mass 02/19/2015   Midsternal chest pain 11/11/2015   Mixed hyperlipidemia 11/14/2014   Morbid obesity (HCC) 09/07/2018   OSA (obstructive sleep apnea) 12/16/2019   Osteoporosis    Panlobular emphysema  (HCC) 11/09/2015   Pneumonia 2013   PONV (postoperative nausea and vomiting)    Positive D-dimer 09/08/2018   Primary osteoarthritis involving multiple joints 08/14/2015   Restless legs syndrome 03/04/2018   Senile purpura (HCC) 08/12/2018   Shortness of breath dyspnea    with exertion   Stasis edema of left lower extremity 04/13/2018   Strain of right shoulder 08/12/2018   Type 2 diabetes mellitus with stage 3 chronic kidney disease (HCC) 08/14/2015   Unstable angina (HCC) 10/30/2018   Vitamin B12 deficiency 04/13/2018   Vitamin D deficiency 02/27/2016   Past Surgical History:  Procedure Laterality Date   ABDOMINAL HYSTERECTOMY     APPENDECTOMY     BREAST SURGERY  40 yrs. ago   growth removed in each breast-benign   CARDIAC CATHETERIZATION     12/14/12 LHC ([facility]): few mild stenotic areas max 40% of ectatic RCA o/w NL coronaries, EF 65%. Med tx.   CATARACT EXTRACTION W/ INTRAOCULAR LENS  IMPLANT, BILATERAL Bilateral 15 yrs ago   CHOLECYSTECTOMY     COLONOSCOPY  04/22/2013   Diverticulosis in the sigmoid colon. Non bleeding internal hemorrhoids. Normal mucosa vascular pattern in the entire examined colon. Three 6 mm polyps n the descending colon. Resected and retrieved.    FOOT SURGERY     LAPAROSCOPIC APPENDECTOMY N/A 02/19/2015   Procedure: EXCISION OF MESENTERIC MASS;  Surgeon: Almond Lint, MD;  Location: [facility] OR;  Service: General;  Laterality: N/A;   REPLACEMENT TOTAL KNEE BILATERAL     Social History:  reports that she quit smoking about 6 years ago. Her smoking use included cigarettes. She has never used smokeless tobacco. She reports that she does not drink alcohol and does not use drugs.  Allergies  Allergen Reactions   Codeine Swelling   Levofloxacin     Other reaction(s): Malaise (intolerance)   Penicillins Swelling    Has patient had a PCN reaction causing immediate rash, facial/tongue/throat swelling, SOB or lightheadedness with hypotension:YES Has patient had a PCN  reaction causing severe rash involving mucus membranes or skin necrosis: NO Has patient had a PCN reaction that required hospitalization NO Has patient had a PCN reaction occurring within the last 10 years: NO If all of the above answers are "NO", then may proceed with Cephalosporin use.   Tape Rash    Family History  Problem Relation Age of Onset   Heart attack Mother     Prior to Admission medications   Medication Sig Start Date End Date Taking? Authorizing Provider  acetaminophen (TYLENOL) 500 MG tablet Take 500 mg by mouth as needed for mild pain (pain score 1-3) or headache.   Yes [provider]  albuterol (PROVENTIL HFA;VENTOLIN HFA) 108 (90 BASE) MCG/ACT inhaler Inhale 2 puffs into the lungs every 6 (six) hours as needed for wheezing or shortness of breath.    Yes [provider]  amiodarone (PACERONE) 200 MG tablet Take 1 tablet by mouth once daily 12/03/22  Yes Flossie Dibble, NP  apixaban (ELIQUIS) 5 MG TABS tablet Take 1 tablet (5 mg total) by mouth 2 (two) times daily. 03/13/22  03/08/23 Yes Alver Sorrow, NP  budesonide-formoterol (SYMBICORT) 160-4.5 MCG/ACT inhaler Inhale 2 puffs into the lungs 2 (two) times daily.   Yes [provider]  carboxymethylcellulose (REFRESH PLUS) 0.5 % SOLN Place 1 drop into both eyes as needed.   Yes [provider]  citalopram (CELEXA) 20 MG tablet Take 20 mg by mouth daily. 10/12/16  Yes [provider]  clonazePAM (KLONOPIN) 1 MG tablet Take 1 mg by mouth at bedtime. 11/20/14  Yes [provider]  diclofenac Sodium (VOLTAREN) 1 % GEL APPLY 2 GRAMS TOPICALLY TO AFFECTED AREA THREE TIMES DAILY Patient taking differently: Apply 2 g topically daily as needed (joint pain). 08/08/19  Yes Nadara Mustard, MD  ferrous sulfate 325 (65 FE) MG EC tablet Take 325 mg by mouth daily with breakfast.   Yes [provider]  furosemide (LASIX) 20 MG tablet Take 3 tablets (60 mg total) by mouth daily.  03/13/22 11/25/28 Yes Alver Sorrow, NP  gabapentin (NEURONTIN) 100 MG capsule Take 100 mg by mouth at bedtime.   Yes [provider]  glimepiride (AMARYL) 4 MG tablet Take 4 mg by mouth daily.   Yes [provider]  metFORMIN (GLUCOPHAGE) 500 MG tablet Take 500 mg by mouth daily. When sugar is elevated, give 1 additional dose in the evening   Yes [provider]  Multiple Vitamin (MULTIVITAMIN) capsule Take 1 capsule by mouth daily.   Yes [provider]  nitroGLYCERIN (NITROSTAT) 0.4 MG SL tablet Place 1 tablet (0.4 mg total) under the tongue every 5 (five) minutes as needed for chest pain. 08/22/20 08/21/27 Yes Georgeanna Lea, MD  nystatin (MYCOSTATIN/NYSTOP) powder Apply 1 Application topically 2 (two) times daily as needed (yeast). 07/31/22  Yes [provider]  pantoprazole (PROTONIX) 40 MG tablet Take 40 mg by mouth 2 (two) times daily. 11/14/16  Yes [provider]  potassium chloride (MICRO-K) 10 MEQ CR capsule Take 10 mEq by mouth daily. 11/20/14  Yes [provider]  rOPINIRole (REQUIP) 0.25 MG tablet Take 0.25 mg by mouth at bedtime.   Yes [provider]  rosuvastatin (CRESTOR) 40 MG tablet Take 40 mg by mouth daily.   Yes [provider]  senna (SENOKOT) 8.6 MG TABS tablet Take 2 tablets by mouth at bedtime.   Yes [provider]  sitaGLIPtin (JANUVIA) 100 MG tablet Take 100 mg by mouth daily. 03/11/22  Yes [provider]  spironolactone (ALDACTONE) 25 MG tablet Take 0.5 tablets (12.5 mg total) by mouth daily. 04/29/22  Yes Georgeanna Lea, MD  Vitamin D, Ergocalciferol, (DRISDOL) 1.25 MG (50000 UNIT) CAPS capsule Take 1 capsule by mouth once a week. Take on Sunday 02/10/23  Yes [provider]    Physical Exam: Vitals:   02/14/23 0145 02/14/23 0249 02/14/23 0440 02/14/23 0802  BP:  (!) 113/93 (!) 122/36 (!) 114/92  Pulse: 64 64 61 65  Resp: 16  20 20   Temp:  98.4 F  (36.9 C) 98.3 F (36.8 C) 98.4 F (36.9 C)  TempSrc:  Oral Oral Oral  SpO2: 100% 100% 100% 99%   Constitutional: NAD, calm, uncomfortable Respiratory: clear to auscultation bilaterally, no wheezing, no crackles. Normal respiratory effort. No accessory muscle use.  Nasal cannula O2 Cardiovascular: Regular rate and rhythm, no murmurs / rubs / gallops.  Bilateral lower extremity 1-2+ pitting edema. 2+ pedal pulses. No carotid bruits.  Abdomen: no tenderness, no masses palpated. No hepatosplenomegaly. Bowel sounds positive.  Genitourinary: Positive  CVAT bilaterally worse on the left per patient report Musculoskeletal: no clubbing / cyanosis. No joint deformity upper and lower extremities. Good ROM, no contractures. Normal muscle tone.  Skin: no rashes, lesions, ulcers. No induration Neurologic: CN 2-12 grossly intact. Sensation intact, DTR normal. Strength 4/5 x all 4 extremities.  Psychiatric: Normal judgment and insight. Alert and oriented x 3. Normal mood.     Data Reviewed:  Initial labs unremarkable except for glucose 195, BUN 24, creatinine 0.94, white count normal, hemoglobin 10.9 and platelets 256,000  PCR for influenza, RSV and COVID-negative  Urinalysis highly abnormal with hazy appearance, large amount of hemoglobin, nitrite negative, rare bacteria, RBCs greater than 50, large amount of leukocytes and WBCs greater than 50.  Urine culture obtained and pending  CT renal stone study as above  Chest x-ray unremarkable  Assessment and Plan: Symptomatic right renal calculus with fullness right renal pelvis Appreciate assistance of urology team-patient to undergo DJ stent placement today Holding Eliquis-resume at discretion of urology team Continue IV Rocephin and follow-up on urine culture Continue IV Dilaudid for pain and transition to oral agents postoperatively N.p.o. and complaining of thirst so we will initiate D5 normal saline with potassium and can discontinue in the  postoperative setting once tolerating p.o.'s Physical therapy evaluation in the postoperative setting  Chronic atrial fibrillation on Eliquis Currently rate controlled Eliquis at discretion of urology team Resume amiodarone in the postoperative setting  Chronic diastolic heart failure Will briefly hold Aldactone and Lasix since patient is n.p.o. and resume in the postoperative setting Also on Micro-K prior to admission  COPD on home oxygen Continue supportive care with oxygen and home MDI/nebulizer  Diabetes mellitus 2 N.p.o. therefore we will hold Amaryl and Januvia Did receive IV contrast so we will hold metformin for 48 hours Follow CBGs and provide SSI Continue Neurontin and Requip for peripheral neuropathy and restless leg syndrome  Dyslipidemia Continue Crestor  History of depression Continue Celexa and clonazepam at bedtime for chronic insomnia    Advance Care Planning:   Code Status: Limited: Do not attempt resuscitation (DNR) -DNR-LIMITED -Do Not Intubate/DNI    VTE prophylaxis: Initially SCDs until cleared by urology to resume home Eliquis  Consults: Urology  Family Communication: Daughter at bedside  Severity of Illness: The appropriate patient status for this patient is INPATIENT. Inpatient status is judged to be reasonable and necessary in order to provide the required intensity of service to ensure the patient's safety. The patient's presenting symptoms, physical exam findings, and initial radiographic and laboratory data in the context of their chronic comorbidities is felt to place them at high risk for further clinical deterioration. Furthermore, it is not anticipated that the patient will be medically stable for discharge from the hospital within 2 midnights of admission.   * I certify that at the point of admission it is my clinical judgment that the patient will require inpatient hospital care spanning beyond 2 midnights from the point of admission due to  high intensity of service, high risk for further deterioration and high frequency of surveillance required.*  Author: Junious Silk, NP 02/14/2023 9:41 AM  For on call review [email or web address].

## 2023-02-14 NOTE — ED Notes (Signed)
ED TO INPATIENT HANDOFF REPORT  ED Nurse Name and Phone #: Louie Casa Name/Age/Gender Danielle [name] 89 y.o. female Room/Bed: 033C/033C  Code Status   Code Status: Prior  Home/SNF/Other Home Patient oriented to: self, place, time, and situation - pt able to answer all questions appropriately - however- she is very forgetful and requires frequent reminder/orientation Is this baseline? Yes   Triage Complete: Triage complete  Chief Complaint Nephrolithiasis [N20.0]  Triage Note Pt c/o lower abd pain x a few days; hx frequent UTI's, confirmed 11 mm kidney stone per pt; wears O2 PRN; endorses nausea and dysuria; endorses sob, chronic   Allergies Allergies  Allergen Reactions   Codeine Swelling   Levofloxacin     Other reaction(s): Malaise (intolerance)   Penicillins Swelling    Has patient had a PCN reaction causing immediate rash, facial/tongue/throat swelling, SOB or lightheadedness with hypotension:YES Has patient had a PCN reaction causing severe rash involving mucus membranes or skin necrosis: NO Has patient had a PCN reaction that required hospitalization NO Has patient had a PCN reaction occurring within the last 10 years: NO If all of the above answers are "NO", then may proceed with Cephalosporin use.   Tape Rash    Level of Care/Admitting Diagnosis ED Disposition     ED Disposition  Admit   Condition  --   Comment  Hospital Area: [facility] [[id number]]  Level of Care: Med-Surg [[id number]]  May admit patient to [facility] or [facility] if equivalent level of care is available:: No  Covid Evaluation: Asymptomatic - no recent exposure (last 10 days) testing not required  Diagnosis: Nephrolithiasis [161096]  Admitting Physician: Gery Pray [[id number]]  Attending Physician: Gery Pray [[id number]]  Certification:: I certify this patient will need inpatient services for at least 2 midnights  Expected Medical Readiness: 02/09/2023           B Medical/Surgery History Past Medical History:  Diagnosis Date   Anginal pain (HCC)    Arthritis    Asthma    At high risk for falls 11/11/2017   Chest pain 11/09/2015   CHF (congestive heart failure) (HCC)    Chronic diastolic congestive heart failure (HCC) 11/11/2015   Chronic insomnia 10/24/2015   Chronic pain of both knees 05/14/2018   Chronic respiratory failure with hypoxia (HCC) 05/05/2017   CKD (chronic kidney disease) stage 3, GFR 30-59 ml/min (HCC) 05/07/2017   COPD (chronic obstructive pulmonary disease) (HCC)    Coronary artery disease    Coronary artery disease involving native coronary artery of native heart without angina pectoris 11/14/2014   Overview:  40% RCA a cardiac catheter in 2014   Current mild episode of major depressive disorder (HCC) 11/14/2014   Depression    DOE (dyspnea on exertion) 09/07/2018   Dysphagia 08/12/2018   Edema 08/12/2018   Elevated blood sugar 11/09/2015   Emphysema lung (HCC)    Flat feet, bilateral 05/30/2020   Gastroesophageal reflux disease without esophagitis 11/11/2017   GERD (gastroesophageal reflux disease)    Headache    History of hiatal hernia    Hyperlipidemia    Hypertension    Insomnia    Internal hemorrhoids 11/26/2018   Iron deficiency anemia 08/12/2018   Malaise and fatigue 08/14/2015   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/26/2018   Mesenteric mass 02/19/2015   Midsternal chest pain 11/11/2015   Mixed hyperlipidemia 11/14/2014   Morbid obesity (HCC) 09/07/2018   OSA (obstructive sleep apnea) 12/16/2019   Osteoporosis  Panlobular emphysema (HCC) 11/09/2015   Pneumonia 2013   PONV (postoperative nausea and vomiting)    Positive D-dimer 09/08/2018   Primary osteoarthritis involving multiple joints 08/14/2015   Restless legs syndrome 03/04/2018   Senile purpura (HCC) 08/12/2018   Shortness of breath dyspnea    with exertion   Stasis edema of left lower extremity 04/13/2018   Strain of right shoulder 08/12/2018    Type 2 diabetes mellitus with stage 3 chronic kidney disease (HCC) 08/14/2015   Unstable angina (HCC) 10/30/2018   Vitamin B12 deficiency 04/13/2018   Vitamin D deficiency 02/27/2016   Past Surgical History:  Procedure Laterality Date   ABDOMINAL HYSTERECTOMY     APPENDECTOMY     BREAST SURGERY  40 yrs. ago   growth removed in each breast-benign   CARDIAC CATHETERIZATION     12/14/12 [facility] ([facility]): few mild stenotic areas max 40% of ectatic RCA o/w NL coronaries, EF 65%. Med tx.   CATARACT EXTRACTION W/ INTRAOCULAR LENS  IMPLANT, BILATERAL Bilateral 15 yrs ago   CHOLECYSTECTOMY     COLONOSCOPY  04/22/2013   Diverticulosis in the sigmoid colon. Non bleeding internal hemorrhoids. Normal mucosa vascular pattern in the entire examined colon. Three 6 mm polyps n the descending colon. Resected and retrieved.    FOOT SURGERY     LAPAROSCOPIC APPENDECTOMY N/A 02/19/2015   Procedure: EXCISION OF MESENTERIC MASS;  Surgeon: Almond Lint, MD;  Location: [facility] OR;  Service: General;  Laterality: N/A;   REPLACEMENT TOTAL KNEE BILATERAL       A IV Location/Drains/Wounds Patient Lines/Drains/Airways Status     Active Line/Drains/Airways     Name Placement date Placement time Site Days   Peripheral IV 02/13/23 22 G Left Antecubital 02/13/23  1749  Antecubital  1   Peripheral IV 02/13/23 20 G Left Antecubital 02/13/23  1954  Antecubital  1            Intake/Output Last 24 hours No intake or output data in the 24 hours ending 02/14/23 0118  Labs/Imaging Results for orders placed or performed during the hospital encounter of 02/13/23 (from the past 48 hour(s))  CBC with Differential     Status: Abnormal   Collection Time: 02/13/23 12:52 PM  Result Value Ref Range   WBC 10.3 4.0 - 10.5 K/uL   RBC 3.55 (L) 3.87 - 5.11 MIL/uL   Hemoglobin 10.9 (L) 12.0 - 15.0 g/dL   HCT 40.9 (L) 81.1 - 91.4 %   MCV 100.0 80.0 - 100.0 fL   MCH 30.7 26.0 - 34.0 pg   MCHC 30.7 30.0 - 36.0 g/dL   RDW 78.2 95.6 -  21.3 %   Platelets 256 150 - 400 K/uL   nRBC 0.0 0.0 - 0.2 %   Neutrophils Relative % 72 %   Neutro Abs 7.4 1.7 - 7.7 K/uL   Lymphocytes Relative 17 %   Lymphs Abs 1.8 0.7 - 4.0 K/uL   Monocytes Relative 8 %   Monocytes Absolute 0.8 0.1 - 1.0 K/uL   Eosinophils Relative 2 %   Eosinophils Absolute 0.2 0.0 - 0.5 K/uL   Basophils Relative 1 %   Basophils Absolute 0.1 0.0 - 0.1 K/uL   Immature Granulocytes 0 %   Abs Immature Granulocytes 0.04 0.00 - 0.07 K/uL    Comment: Performed at [facility], [address] [place], [place] [address]  Comprehensive metabolic panel     Status: Abnormal   Collection Time: 02/13/23 12:52 PM  Result  Value Ref Range   Sodium 135 135 - 145 mmol/L   Potassium 3.9 3.5 - 5.1 mmol/L   Chloride 99 98 - 111 mmol/L   CO2 25 22 - 32 mmol/L   Glucose, Bld 185 (H) 70 - 99 mg/dL    Comment: Glucose reference range applies only to samples taken after fasting for at least 8 hours.   BUN 24 (H) 8 - 23 mg/dL   Creatinine, Ser 6.57 0.44 - 1.00 mg/dL   Calcium 9.0 8.9 - 84.6 mg/dL   Total Protein 6.8 6.5 - 8.1 g/dL   Albumin 3.7 3.5 - 5.0 g/dL   AST 33 15 - 41 U/L   ALT 31 0 - 44 U/L   Alkaline Phosphatase 56 38 - 126 U/L   Total Bilirubin 0.3 <1.2 mg/dL   GFR, Estimated >96 >29 mL/min    Comment: (NOTE) Calculated using the CKD-EPI Creatinine Equation (2021)    Anion gap 11 5 - 15    Comment: Performed at [facility], [address], [place], [place] [address]  Urinalysis, Routine w reflex microscopic -Urine, Clean Catch     Status: Abnormal   Collection Time: 02/13/23 12:52 PM  Result Value Ref Range   Color, Urine YELLOW YELLOW   APPearance HAZY (A) CLEAR   Specific Gravity, Urine 1.010 1.005 - 1.030   pH 6.0 5.0 - 8.0   Glucose, UA NEGATIVE NEGATIVE mg/dL   Hgb urine dipstick LARGE (A) NEGATIVE   Bilirubin Urine NEGATIVE NEGATIVE   Ketones, ur NEGATIVE NEGATIVE mg/dL   Protein, ur NEGATIVE NEGATIVE mg/dL   Nitrite NEGATIVE NEGATIVE    Leukocytes,Ua LARGE (A) NEGATIVE   RBC / HPF >50 0 - 5 RBC/hpf   WBC, UA >50 0 - 5 WBC/hpf   Bacteria, UA RARE (A) NONE SEEN   Squamous Epithelial / HPF 0-5 0 - 5 /HPF   WBC Clumps PRESENT    Mucus PRESENT     Comment: Performed at [facility], [address] [place], [place] [address]  Resp panel by RT-PCR (RSV, Flu A&B, Covid) Anterior Nasal Swab     Status: None   Collection Time: 02/13/23  5:55 PM   Specimen: Anterior Nasal Swab  Result Value Ref Range   SARS Coronavirus 2 by RT PCR NEGATIVE NEGATIVE   Influenza A by PCR NEGATIVE NEGATIVE   Influenza B by PCR NEGATIVE NEGATIVE    Comment: (NOTE) The Xpert Xpress SARS-CoV-2/FLU/RSV plus assay is intended as an aid in the diagnosis of influenza from Nasopharyngeal swab specimens and should not be used as a sole basis for treatment. Nasal washings and aspirates are unacceptable for Xpert Xpress SARS-CoV-2/FLU/RSV testing.  Fact Sheet for Patients: BloggerCourse.com  Fact Sheet for Healthcare Providers: SeriousBroker.it  This test is not yet approved or cleared by the [place] FDA and has been authorized for detection and/or diagnosis of SARS-CoV-2 by FDA under an Emergency Use Authorization (EUA). This EUA will remain in effect (meaning this test can be used) for the duration of the COVID-19 declaration under Section 564(b)(1) of the Act, 21 U.S.C. section 360bbb-3(b)(1), unless the authorization is terminated or revoked.     Resp Syncytial Virus by PCR NEGATIVE NEGATIVE    Comment: (NOTE) Fact Sheet for Patients: BloggerCourse.com  Fact Sheet for Healthcare Providers: SeriousBroker.it  This test is not yet approved or cleared by the [place] FDA and has been authorized for detection and/or diagnosis of SARS-CoV-2 by FDA under an Emergency  Use Authorization (EUA). This EUA will remain in effect (meaning  this test can be used) for the duration of the COVID-19 declaration under Section 564(b)(1) of the Act, 21 U.S.C. section 360bbb-3(b)(1), unless the authorization is terminated or revoked.  Performed at [facility], [address] [place], [place] [address]   Lactic acid, plasma     Status: None   Collection Time: 02/13/23  7:51 PM  Result Value Ref Range   Lactic Acid, Venous 1.1 0.5 - 1.9 mmol/L    Comment: Performed at [facility], [address] [place], [place] [address]  Brain natriuretic peptide     Status: None   Collection Time: 02/13/23  7:51 PM  Result Value Ref Range   B Natriuretic Peptide 62.0 0.0 - 100.0 pg/mL    Comment: Performed at [facility], [address] [place], [place] [address]  Troponin I (High Sensitivity)     Status: None   Collection Time: 02/13/23  7:51 PM  Result Value Ref Range   Troponin I (High Sensitivity) 8 <18 ng/L    Comment: (NOTE) Elevated high sensitivity troponin I (hsTnI) values and significant  changes across serial measurements may suggest ACS but many other  chronic and acute conditions are known to elevate hsTnI results.  Refer to the "Links" section for chest pain algorithms and additional  guidance. Performed at [facility], [address], [place], [place] [address]   Lactic acid, plasma     Status: None   Collection Time: 02/13/23  9:43 PM  Result Value Ref Range   Lactic Acid, Venous 0.7 0.5 - 1.9 mmol/L    Comment: Performed at [facility], [address] [place], [place] [address]  Troponin I (High Sensitivity)     Status: None   Collection Time: 02/13/23  9:43 PM  Result Value Ref Range   Troponin I (High Sensitivity) 8 <18 ng/L    Comment: (NOTE) Elevated high sensitivity troponin I (hsTnI) values and significant  changes across serial measurements may suggest ACS but many other  chronic and acute conditions are known to elevate hsTnI results.  Refer to the "Links" section for  chest pain algorithms and additional  guidance. Performed at [facility], [address], [place], [place] [address]    CT Renal Stone Study  Result Date: 02/13/2023 CLINICAL DATA:  Flank pain and known right proximal ureteral stone EXAM: CT ABDOMEN AND PELVIS WITHOUT CONTRAST TECHNIQUE: Multidetector CT imaging of the abdomen and pelvis was performed following the standard protocol without IV contrast. RADIATION DOSE REDUCTION: This exam was performed according to the departmental dose-optimization program which includes automated exposure control, adjustment of the mA and/or kV according to patient size and/or use of iterative reconstruction technique. COMPARISON:  02/11/2023 FINDINGS: Lower chest: No acute abnormality. Hepatobiliary: No focal liver abnormality is seen. Status post cholecystectomy. No biliary dilatation. Pancreas: Unremarkable. No pancreatic ductal dilatation or surrounding inflammatory changes. Spleen: Normal in size without focal abnormality. Adrenals/Urinary Tract: Adrenal glands show a left adrenal adenoma stable in appearance from the prior exam. This measures up to 18 mm and is been seen on multiple previous studies. Left kidney demonstrates nonobstructing renal calculi measuring up to 8 mm. Left ureter is within normal limits. Right kidney demonstrates multiple renal calculi stable in appearance from the prior exam. Additionally a proximal right ureteral stone is noted measuring 8-9 mm. Mild fullness of the right renal pelvis is noted similar  to that seen on the prior exam. The more distal right ureter is unremarkable. The bladder is decompressed. Small left renal cyst is noted stable from the prior study. Stomach/Bowel: Scattered diverticular change of the colon is noted without evidence of diverticulitis. No obstructive changes are seen. The appendix has been surgically removed. Small bowel and stomach are unremarkable. Vascular/Lymphatic: Aortic atherosclerosis. No  enlarged abdominal or pelvic lymph nodes. Reproductive: Status post hysterectomy. No adnexal masses. Other: No abdominal wall hernia or abnormality. No abdominopelvic ascites. Musculoskeletal: No acute or significant osseous findings. IMPRESSION: Bilateral nonobstructing renal calculi stable from the prior exam. Stable 8-9 mm proximal right ureteral stone with mild fullness of the right renal pelvis. Stable left adrenal lesion consistent with adenoma. This is been stable over multiple previous exams and no further follow-up is recommended. Electronically Signed   By: Alcide Clever M.D.   On: 02/13/2023 20:48   DG Chest Portable 1 View  Result Date: 02/13/2023 CLINICAL DATA:  Cough. EXAM: PORTABLE CHEST 1 VIEW COMPARISON:  Chest radiograph dated 01/15/2023 FINDINGS: Background of emphysema. No focal consolidation, pleural effusion, pneumothorax. Mild cardiomegaly. Atherosclerotic calcification of the aorta. No acute osseous pathology. IMPRESSION: 1. No active disease. 2. Emphysema. 3. Mild cardiomegaly. Electronically Signed   By: Elgie Collard M.D.   On: 02/13/2023 20:04    Pending Labs Unresulted Labs (From admission, onward)     Start     Ordered   02/14/23 0032  Hemoglobin A1c  Once,   URGENT       Comments: To assess prior glycemic control    02/14/23 0031   02/13/23 1807  Urine Culture  Add-on,   AD       Question:  Indication  Answer:  Flank Pain   02/13/23 1806   02/13/23 1755  Blood culture (routine x 2)  BLOOD CULTURE X 2,   R (with STAT occurrences)      02/13/23 1755            Vitals/Pain Today's Vitals   02/13/23 2355 02/14/23 0000 02/14/23 0030 02/14/23 0045  BP:  (!) 116/40 (!) 108/48   Pulse: 62 (!) 58 63 61  Resp: 18 20 15 20   Temp:      TempSrc:      SpO2: 100% 99% 99% 97%  PainSc:        Isolation Precautions No active isolations  Medications Medications  insulin aspart (novoLOG) injection 0-15 Units (has no administration in time range)   HYDROmorphone (DILAUDID) injection 0.5 mg (has no administration in time range)  ondansetron (ZOFRAN) injection 4 mg (has no administration in time range)  morphine (PF) 4 MG/ML injection 4 mg (4 mg Intravenous Given 02/13/23 1811)  ondansetron (ZOFRAN) injection 4 mg (4 mg Intravenous Given 02/13/23 1810)  cefTRIAXone (ROCEPHIN) 1 g in sodium chloride 0.9 % 100 mL IVPB (0 g Intravenous Stopped 02/13/23 2209)  morphine (PF) 4 MG/ML injection 4 mg (4 mg Intravenous Given 02/13/23 2319)    Mobility walks with device - walker at home - high fall risk s/t pain medications      Focused Assessments -   R Recommendations: See Admitting Provider Note  Report given to:   Additional Notes: -

## 2023-02-14 NOTE — ED Notes (Signed)
Assisted patient to the [facility] and returned to bed.

## 2023-02-14 NOTE — Interval H&P Note (Signed)
History and Physical Interval Note:  02/14/2023 10:22 AM  Danielle [name]  has presented today for surgery, with the diagnosis of RIGHT URETERAL STONE.  The various methods of treatment have been discussed with the patient and family. After consideration of risks, benefits and other options for treatment, the patient has consented to  Procedure(s): CYSTOSCOPY WITH RETROGRADE PYELOGRAM, URETEROSCOPY AND STENT PLACEMENT (Right) as a surgical intervention.  The patient's history has been reviewed, patient examined, no change in status, stable for surgery.  I have reviewed the patient's chart and labs.  Questions were answered to the patient's satisfaction.     Joline Maxcy

## 2023-02-14 NOTE — Anesthesia Preprocedure Evaluation (Addendum)
Anesthesia Evaluation  Patient identified by MRN, date of birth, ID band Patient awake    Reviewed: Allergy & Precautions, NPO status , Patient's Chart, lab work & pertinent test results  History of Anesthesia Complications (+) PONV and history of anesthetic complications  Airway Mallampati: III       Dental  (+) Edentulous Upper, Edentulous Lower   Pulmonary shortness of breath, asthma , sleep apnea , COPD,  COPD inhaler and oxygen dependent, former smoker   Pulmonary exam normal        Cardiovascular hypertension, + angina  + CAD, + Peripheral Vascular Disease and +CHF  Normal cardiovascular exam  ECHO: 1. Left ventricular ejection fraction, by estimation, is 60 to 65%. The  left ventricle has normal function. The left ventricle has no regional  wall motion abnormalities. Left ventricular diastolic parameters are  consistent with Grade I diastolic  dysfunction (impaired relaxation).   2. Right ventricular systolic function is normal. The right ventricular  size is normal. There is normal pulmonary artery systolic pressure.   3. The mitral valve is normal in structure. No evidence of mitral valve  regurgitation. No evidence of mitral stenosis.   4. The aortic valve is calcified. Aortic valve regurgitation is not  visualized. Aortic valve sclerosis/calcification is present, without any  evidence of aortic stenosis.   5. The inferior vena cava is normal in size with greater than 50%  respiratory variability, suggesting right atrial pressure of 3 mmHg.     Neuro/Psych  Headaches PSYCHIATRIC DISORDERS  Depression     Neuromuscular disease    GI/Hepatic Neg liver ROS, hiatal hernia,GERD  Medicated and Controlled,,  Endo/Other  diabetes, Oral Hypoglycemic Agents  Class 3 obesity  Renal/GU Renal disease     Musculoskeletal  (+) Arthritis ,    Abdominal  (+) + obese  Peds  Hematology  (+) Blood dyscrasia (Eliquis),  anemia   Anesthesia Other Findings RIGHT URETERAL STONE  Reproductive/Obstetrics                             Anesthesia Physical Anesthesia Plan  ASA: 4  Anesthesia Plan: General   Post-op Pain Management:    Induction: Intravenous  PONV Risk Score and Plan: 4 or greater and Ondansetron, Dexamethasone and Treatment may vary due to age or medical condition  Airway Management Planned: LMA  Additional Equipment:   Intra-op Plan:   Post-operative Plan: Extubation in OR  Informed Consent: I have reviewed the patients History and Physical, chart, labs and discussed the procedure including the risks, benefits and alternatives for the proposed anesthesia with the patient or authorized representative who has indicated his/her understanding and acceptance.   Patient has DNR.  Discussed DNR with patient and Continue DNR.   Dental advisory given  Plan Discussed with: CRNA  Anesthesia Plan Comments:         Anesthesia Quick Evaluation

## 2023-02-15 ENCOUNTER — Other Ambulatory Visit: Payer: Self-pay

## 2023-02-15 ENCOUNTER — Encounter (HOSPITAL_COMMUNITY): Payer: Self-pay | Admitting: Urology

## 2023-02-15 DIAGNOSIS — M549 Dorsalgia, unspecified: Secondary | ICD-10-CM | POA: Diagnosis not present

## 2023-02-15 DIAGNOSIS — N200 Calculus of kidney: Secondary | ICD-10-CM | POA: Diagnosis not present

## 2023-02-15 LAB — CBC
HCT: 29.4 % — ABNORMAL LOW (ref 36.0–46.0)
Hemoglobin: 9.2 g/dL — ABNORMAL LOW (ref 12.0–15.0)
MCH: 31.7 pg (ref 26.0–34.0)
MCHC: 31.3 g/dL (ref 30.0–36.0)
MCV: 101.4 fL — ABNORMAL HIGH (ref 80.0–100.0)
Platelets: 201 10*3/uL (ref 150–400)
RBC: 2.9 MIL/uL — ABNORMAL LOW (ref 3.87–5.11)
RDW: 13.2 % (ref 11.5–15.5)
WBC: 7 10*3/uL (ref 4.0–10.5)
nRBC: 0 % (ref 0.0–0.2)

## 2023-02-15 LAB — GLUCOSE, CAPILLARY
Glucose-Capillary: 150 mg/dL — ABNORMAL HIGH (ref 70–99)
Glucose-Capillary: 161 mg/dL — ABNORMAL HIGH (ref 70–99)
Glucose-Capillary: 178 mg/dL — ABNORMAL HIGH (ref 70–99)
Glucose-Capillary: 183 mg/dL — ABNORMAL HIGH (ref 70–99)
Glucose-Capillary: 188 mg/dL — ABNORMAL HIGH (ref 70–99)
Glucose-Capillary: 222 mg/dL — ABNORMAL HIGH (ref 70–99)

## 2023-02-15 LAB — COMPREHENSIVE METABOLIC PANEL
ALT: 27 U/L (ref 0–44)
AST: 27 U/L (ref 15–41)
Albumin: 2.8 g/dL — ABNORMAL LOW (ref 3.5–5.0)
Alkaline Phosphatase: 49 U/L (ref 38–126)
Anion gap: 8 (ref 5–15)
BUN: 28 mg/dL — ABNORMAL HIGH (ref 8–23)
CO2: 26 mmol/L (ref 22–32)
Calcium: 8.5 mg/dL — ABNORMAL LOW (ref 8.9–10.3)
Chloride: 103 mmol/L (ref 98–111)
Creatinine, Ser: 1.01 mg/dL — ABNORMAL HIGH (ref 0.44–1.00)
GFR, Estimated: 54 mL/min — ABNORMAL LOW (ref >60)
Glucose, Bld: 175 mg/dL — ABNORMAL HIGH (ref 70–99)
Potassium: 4.3 mmol/L (ref 3.5–5.1)
Sodium: 137 mmol/L (ref 135–145)
Total Bilirubin: 0.4 mg/dL (ref <1.2)
Total Protein: 5.4 g/dL — ABNORMAL LOW (ref 6.5–8.1)

## 2023-02-15 MED ORDER — POLYETHYLENE GLYCOL 3350 17 G PO PACK
17.0000 g | PACK | Freq: Every day | ORAL | Status: DC
  Administered 2023-02-16 – 2023-02-19 (×4): 17 g via ORAL
  Filled 2023-02-15 (×4): qty 1

## 2023-02-15 MED ORDER — APIXABAN 5 MG PO TABS
5.0000 mg | ORAL_TABLET | Freq: Two times a day (BID) | ORAL | Status: DC
  Administered 2023-02-15 – 2023-02-17 (×4): 5 mg via ORAL
  Filled 2023-02-15 (×4): qty 1

## 2023-02-15 MED ORDER — CHLORHEXIDINE GLUCONATE CLOTH 2 % EX PADS
6.0000 | MEDICATED_PAD | Freq: Every day | CUTANEOUS | Status: DC
  Administered 2023-02-15 – 2023-02-18 (×4): 6 via TOPICAL

## 2023-02-15 MED ORDER — SENNOSIDES-DOCUSATE SODIUM 8.6-50 MG PO TABS: 1.0000 | ORAL_TABLET | Freq: Every evening | ORAL | Status: DC | PRN

## 2023-02-15 NOTE — Progress Notes (Signed)
Urology Inpatient Progress Note  Subjective: Patient awake and alert.  Feels much better.   Anti-infectives: Anti-infectives (From admission, onward)    Start     Dose/Rate Route Frequency Ordered Stop   02/14/23 0800  cefTRIAXone (ROCEPHIN) 1 g in sodium chloride 0.9 % 100 mL IVPB        1 g 200 mL/hr over 30 Minutes Intravenous Daily 02/14/23 0732 02/17/23 0959   02/13/23 2130  cefTRIAXone (ROCEPHIN) 1 g in sodium chloride 0.9 % 100 mL IVPB        1 g 200 mL/hr over 30 Minutes Intravenous  Once 02/13/23 2130 02/13/23 2209       Current Facility-Administered Medications  Medication Dose Route Frequency Provider Last Rate Last Admin   acetaminophen (TYLENOL) tablet 650 mg  650 mg Oral Q6H PRN Russella Dar, NP   650 mg at 02/14/23 2028   Or   acetaminophen (TYLENOL) suppository 650 mg  650 mg Rectal Q6H PRN Russella Dar, NP       albuterol (PROVENTIL) (2.5 MG/3ML) 0.083% nebulizer solution 2.5 mg  2.5 mg Nebulization Q6H PRN Russella Dar, NP       amiodarone (PACERONE) tablet 200 mg  200 mg Oral Daily Junious Silk L, NP   200 mg at 02/15/23 0910   cefTRIAXone (ROCEPHIN) 1 g in sodium chloride 0.9 % 100 mL IVPB  1 g Intravenous Daily Jerald Kief, MD 200 mL/hr at 02/15/23 0958 1 g at 02/15/23 0958   citalopram (CELEXA) tablet 20 mg  20 mg Oral Daily Russella Dar, NP   20 mg at 02/15/23 0908   clonazePAM (KLONOPIN) tablet 1 mg  1 mg Oral QHS Russella Dar, NP   1 mg at 02/14/23 2028   ferrous sulfate tablet 325 mg  325 mg Oral Q breakfast Russella Dar, NP   325 mg at 02/15/23 1610   furosemide (LASIX) tablet 40 mg  40 mg Oral Daily Jerald Kief, MD   40 mg at 02/15/23 9604   gabapentin (NEURONTIN) capsule 100 mg  100 mg Oral QHS Russella Dar, NP   100 mg at 02/14/23 2028   HYDROmorphone (DILAUDID) injection 0.5 mg  0.5 mg Intravenous Q3H PRN Jerald Kief, MD   0.5 mg at 02/14/23 1511   insulin aspart (novoLOG) injection 0-15 Units  0-15 Units  Subcutaneous Q4H Crosley, Debby, MD   3 Units at 02/15/23 0901   ketorolac (TORADOL) 15 MG/ML injection 15 mg  15 mg Intravenous Q6H PRN Jerald Kief, MD   15 mg at 02/14/23 1643   mometasone-formoterol (DULERA) 200-5 MCG/ACT inhaler 2 puff  2 puff Inhalation BID Russella Dar, NP   2 puff at 02/15/23 0843   ondansetron (ZOFRAN) injection 4 mg  4 mg Intravenous Q6H PRN Crosley, Debby, MD       pantoprazole (PROTONIX) EC tablet 40 mg  40 mg Oral BID Russella Dar, NP   40 mg at 02/15/23 0905   rOPINIRole (REQUIP) tablet 0.25 mg  0.25 mg Oral QHS Russella Dar, NP   0.25 mg at 02/14/23 2028   rosuvastatin (CRESTOR) tablet 40 mg  40 mg Oral Daily Russella Dar, NP   40 mg at 02/15/23 0908   senna (SENOKOT) tablet 17.2 mg  2 tablet Oral QHS Russella Dar, NP   17.2 mg at 02/14/23 2028   sodium chloride flush (NS) 0.9 % injection 3 mL  3 mL Intravenous  Q12H Russella Dar, NP   3 mL at 02/15/23 0940   traZODone (DESYREL) tablet 100 mg  100 mg Oral QHS PRN Jerald Kief, MD       Facility-Administered Medications Ordered in Other Encounters  Medication Dose Route Frequency Provider Last Rate Last Admin   clindamycin (CLEOCIN) 900 mg in dextrose 5 % 50 mL IVPB  900 mg Intravenous 60 min Pre-Op Almond Lint, MD       And   gentamicin (GARAMYCIN) 5 mg/kg in dextrose 5 % 50 mL IVPB  5 mg/kg Intravenous 60 min Pre-Op Almond Lint, MD         Objective: Vital signs in last 24 hours: Temp:  [97.8 F (36.6 C)-98.6 F (37 C)] 98.5 F (36.9 C) (11/17 0822) Pulse Rate:  [55-64] 57 (11/17 0822) Resp:  [13-21] 18 (11/17 0822) BP: (110-151)/(33-55) 137/55 (11/17 0822) SpO2:  [93 %-100 %] 100 % (11/17 0844) FiO2 (%):  [17 %] 17 % (11/16 1956) Weight:  [109.7 kg] 109.7 kg (11/16 1037)  Intake/Output from previous day: 11/16 0701 - 11/17 0700 In: 1326.1 [P.O.:720; I.V.:529; IV Piggyback:77.1] Out: 250 [Urine:250] Intake/Output this shift: No intake/output data  recorded.  GENERAL APPEARANCE:  Well appearing, well developed, well nourished, NAD    Lab Results:  Recent Labs    02/13/23 1252 02/15/23 0636  WBC 10.3 7.0  HGB 10.9* 9.2*  HCT 35.5* 29.4*  PLT 256 201   BMET Recent Labs    02/13/23 1252 02/15/23 0636  NA 135 137  K 3.9 4.3  CL 99 103  CO2 25 26  GLUCOSE 185* 175*  BUN 24* 28*  CREATININE 0.91 1.01*  CALCIUM 9.0 8.5*   PT/INR No results for input(s): "LABPROT", "INR" in the last 72 hours. ABG No results for input(s): "PHART", "HCO3" in the last 72 hours.  Invalid input(s): "PCO2", "PO2"  Studies/Results: CT Renal Stone Study  Result Date: 02/13/2023 CLINICAL DATA:  Flank pain and known right proximal ureteral stone EXAM: CT ABDOMEN AND PELVIS WITHOUT CONTRAST TECHNIQUE: Multidetector CT imaging of the abdomen and pelvis was performed following the standard protocol without IV contrast. RADIATION DOSE REDUCTION: This exam was performed according to the departmental dose-optimization program which includes automated exposure control, adjustment of the mA and/or kV according to patient size and/or use of iterative reconstruction technique. COMPARISON:  02/11/2023 FINDINGS: Lower chest: No acute abnormality. Hepatobiliary: No focal liver abnormality is seen. Status post cholecystectomy. No biliary dilatation. Pancreas: Unremarkable. No pancreatic ductal dilatation or surrounding inflammatory changes. Spleen: Normal in size without focal abnormality. Adrenals/Urinary Tract: Adrenal glands show a left adrenal adenoma stable in appearance from the prior exam. This measures up to 18 mm and is been seen on multiple previous studies. Left kidney demonstrates nonobstructing renal calculi measuring up to 8 mm. Left ureter is within normal limits. Right kidney demonstrates multiple renal calculi stable in appearance from the prior exam. Additionally a proximal right ureteral stone is noted measuring 8-9 mm. Mild fullness of the right  renal pelvis is noted similar to that seen on the prior exam. The more distal right ureter is unremarkable. The bladder is decompressed. Small left renal cyst is noted stable from the prior study. Stomach/Bowel: Scattered diverticular change of the colon is noted without evidence of diverticulitis. No obstructive changes are seen. The appendix has been surgically removed. Small bowel and stomach are unremarkable. Vascular/Lymphatic: Aortic atherosclerosis. No enlarged abdominal or pelvic lymph nodes. Reproductive: Status post hysterectomy. No adnexal masses. Other:  No abdominal wall hernia or abnormality. No abdominopelvic ascites. Musculoskeletal: No acute or significant osseous findings. IMPRESSION: Bilateral nonobstructing renal calculi stable from the prior exam. Stable 8-9 mm proximal right ureteral stone with mild fullness of the right renal pelvis. Stable left adrenal lesion consistent with adenoma. This is been stable over multiple previous exams and no further follow-up is recommended. Electronically Signed   By: Alcide Clever M.D.   On: 02/13/2023 20:48   DG Chest Portable 1 View  Result Date: 02/13/2023 CLINICAL DATA:  Cough. EXAM: PORTABLE CHEST 1 VIEW COMPARISON:  Chest radiograph dated 01/15/2023 FINDINGS: Background of emphysema. No focal consolidation, pleural effusion, pneumothorax. Mild cardiomegaly. Atherosclerotic calcification of the aorta. No acute osseous pathology. IMPRESSION: 1. No active disease. 2. Emphysema. 3. Mild cardiomegaly. Electronically Signed   By: Elgie Collard M.D.   On: 02/13/2023 20:04     Assessment & Plan: Right proximal stone Recurrent UTI  S/p right dj stent- can go home when ok from med standpoint.  Can restart eliquis. Needs to be on low dose prophylaxis with bactrim RS at bedtime after she finishes any additional antibiotic course.  I will arrange outpatient FU   Joline Maxcy, MD 02/15/2023

## 2023-02-15 NOTE — Progress Notes (Signed)
PHARMACY - ANTICOAGULATION CONSULT NOTE  Pharmacy Consult for apixaban Indication: atrial fibrillation  Allergies  Allergen Reactions   Codeine Swelling   Levofloxacin     Other reaction(s): Malaise (intolerance)   Penicillins Swelling    Has patient had a PCN reaction causing immediate rash, facial/tongue/throat swelling, SOB or lightheadedness with hypotension:YES Has patient had a PCN reaction causing severe rash involving mucus membranes or skin necrosis: NO Has patient had a PCN reaction that required hospitalization NO Has patient had a PCN reaction occurring within the last 10 years: NO If all of the above answers are "NO", then may proceed with Cephalosporin use.   Tape Rash    Patient Measurements: Height: 5' 3.5" (161.3 cm) Weight: 109.7 kg (241 lb 13.5 oz) IBW/kg (Calculated) : 53.55  Vital Signs: Temp: 98.2 F (36.8 C) (11/17 1328) Temp Source: Oral (11/17 1328) BP: 108/44 (11/17 1328) Pulse Rate: 68 (11/17 1328)  Labs: Recent Labs    02/13/23 1252 02/13/23 1951 02/13/23 2143 02/15/23 0636  HGB 10.9*  --   --  9.2*  HCT 35.5*  --   --  29.4*  PLT 256  --   --  201  CREATININE 0.91  --   --  1.01*  TROPONINIHS  --  8 8  --     Estimated Creatinine Clearance: 47.1 mL/min (A) (by C-G formula based on SCr of 1.01 mg/dL (H)).   Medical History: Past Medical History:  Diagnosis Date   Anginal pain (HCC)    Arthritis    Asthma    At high risk for falls 11/11/2017   Chest pain 11/09/2015   CHF (congestive heart failure) (HCC)    Chronic diastolic congestive heart failure (HCC) 11/11/2015   Chronic insomnia 10/24/2015   Chronic pain of both knees 05/14/2018   Chronic respiratory failure with hypoxia (HCC) 05/05/2017   CKD (chronic kidney disease) stage 3, GFR 30-59 ml/min (HCC) 05/07/2017   COPD (chronic obstructive pulmonary disease) (HCC)    Coronary artery disease    Coronary artery disease involving native coronary artery of native heart without  angina pectoris 11/14/2014   Overview:  40% RCA a cardiac catheter in 2014   Current mild episode of major depressive disorder (HCC) 11/14/2014   Depression    DOE (dyspnea on exertion) 09/07/2018   Dysphagia 08/12/2018   Edema 08/12/2018   Elevated blood sugar 11/09/2015   Emphysema lung (HCC)    Flat feet, bilateral 05/30/2020   Gastroesophageal reflux disease without esophagitis 11/11/2017   GERD (gastroesophageal reflux disease)    Headache    History of hiatal hernia    Hyperlipidemia    Hypertension    Insomnia    Internal hemorrhoids 11/26/2018   Iron deficiency anemia 08/12/2018   Malaise and fatigue 08/14/2015   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/26/2018   Mesenteric mass 02/19/2015   Midsternal chest pain 11/11/2015   Mixed hyperlipidemia 11/14/2014   Morbid obesity (HCC) 09/07/2018   OSA (obstructive sleep apnea) 12/16/2019   Osteoporosis    Panlobular emphysema (HCC) 11/09/2015   Pneumonia 2013   PONV (postoperative nausea and vomiting)    Positive D-dimer 09/08/2018   Primary osteoarthritis involving multiple joints 08/14/2015   Restless legs syndrome 03/04/2018   Senile purpura (HCC) 08/12/2018   Shortness of breath dyspnea    with exertion   Stasis edema of left lower extremity 04/13/2018   Strain of right shoulder 08/12/2018   Type 2 diabetes mellitus with stage 3 chronic kidney disease (HCC) 08/14/2015  Unstable angina (HCC) 10/30/2018   Vitamin B12 deficiency 04/13/2018   Vitamin D deficiency 02/27/2016      Assessment: 82 yoF admitted with uretal stone s/p stent, pharmacy to resume apixaban for AF (home dose 5mg  BID).   Plan:  Apixaban 5mg  BID Pharmacy will sign off, reconsult as needed  Fredonia Highland, PharmD, BCPS, [facility] Clinical Pharmacist [phone] Please check AMION for all [facility] Pharmacy numbers 02/15/2023

## 2023-02-15 NOTE — Progress Notes (Signed)
  Progress Note   Patient: Danielle [name] GEX:[id number] DOB: February 22, 1934 DOA: 02/13/2023     1 DOS: the patient was seen and examined on 02/15/2023   Brief hospital course: 89yo with hx OA, chronic diastolic CHF, CKD3, CAD, COPD, HTN, HLD, afib on anticoagulation who presented severe back pain and dysuria, found to have renal stone and UTI.   Assessment and Plan: Symptomatic right renal calculus with fullness right renal pelvis -now s/p cystoscopy -discussed with Urology. OK to resume eliquis -OK to d/c home per Urology with regular dose bactrim after completing current course of abx for UTI  UTI -Urine cx neg -would treat 3 days of abx   Chronic atrial fibrillation on Eliquis -Currently rate controlled -continue eliquis   Chronic diastolic heart failure -continue lasix as tolerated -Voiding   COPD on home oxygen Continue supportive care with oxygen and home MDI/nebulizer   Diabetes mellitus 2 N.p.o. therefore we will hold Amaryl and Januvia Currently on SSI as needed   Dyslipidemia Continue Crestor   History of depression Continue Celexa and clonazepam at bedtime for chronic insomnia   Subjective: Without issues this AM  Physical Exam: Vitals:   02/15/23 0513 02/15/23 0822 02/15/23 0844 02/15/23 1328  BP: (!) 139/48 (!) 137/55  (!) 108/44  Pulse: 64 (!) 57  68  Resp: 18 18  18   Temp: 98.6 F (37 C) 98.5 F (36.9 C)  98.2 F (36.8 C)  TempSrc: Oral Oral  Oral  SpO2: 97% 100% 100% 98%  Weight:      Height:       General exam: Awake, laying in bed, in nad Respiratory system: Normal respiratory effort, no wheezing Cardiovascular system: regular rate, s1, s2 Gastrointestinal system: Soft, nondistended, positive BS Central nervous system: CN2-12 grossly intact, strength intact Extremities: Perfused, no clubbing Skin: Normal skin turgor, no notable skin lesions seen Psychiatry: Mood normal // no visual hallucinations   Data Reviewed:  Labs reviewed:  Na 137, K 4.3, Cr 1.01, WBC 7.0, Hgb 9.2  Family Communication: Pt in room, family at bedside  Disposition: Status is: Inpatient Remains inpatient appropriate because: severity of illness  Planned Discharge Destination: Home    Author: Rickey Barbara, MD 02/15/2023 4:31 PM  For on call review [email or web address].

## 2023-02-15 NOTE — Plan of Care (Signed)
  Problem: Coping: Goal: Ability to adjust to condition or change in health will improve Outcome: Progressing   Problem: Fluid Volume: Goal: Ability to maintain a balanced intake and output will improve Outcome: Progressing   Problem: Health Behavior/Discharge Planning: Goal: Ability to identify and utilize available resources and services will improve Outcome: Progressing   

## 2023-02-15 NOTE — Hospital Course (Signed)
89yo with hx OA, chronic diastolic CHF, CKD3, CAD, COPD, HTN, HLD, afib on anticoagulation who presented severe back pain and dysuria, found to have renal stone and UTI.

## 2023-02-15 NOTE — Evaluation (Signed)
Physical Therapy Evaluation Patient Details Name: Danielle [name] MRN: [id number] DOB: 11-11-1933 Today's Date: 02/15/2023  History of Present Illness  89 y.o. female presents to [facility] 02/13/23 with severe back pain, dysuria, and difficulty urinating. Pt with B nonobstructing renal calculi, proximal R uretal stone, and UTI. S/p cystoscopy, R retrograde w/ interpretation and placement of R DJ stent. PMHx: COPD, chronic respiratory failure with hypoxia on home oxygen, coronary artery disease, morbid obesity, diabetes mellitus type 2, chronic kidney disease stage IIIa, hyperlipidemia.   Clinical Impression  Pt in recliner upon arrival with family present and agreeable to PT eval. Prior to admission, pt reports sleeping in a lift chair and occasionally using it for physical assistance to stand. Pt reports being ModI with a RW and always has assistance to ascend/descend steps to get into house. Pt presents close to functional mobility baseline with requiring CGA to stand and to ambulate ~100 ft w/ RW.  Pt presents to therapy session with decreased strength, balance, and mobility. Pt would benefit from acute skilled PT to address functional impairments. Recommending post-acute HHPT to prevent future falls and work towards regaining independece. Acute PT to follow.          If plan is discharge home, recommend the following: A little help with walking and/or transfers;A little help with bathing/dressing/bathroom;Help with stairs or ramp for entrance;Assist for transportation   Can travel by private vehicle    Yes    Equipment Recommendations None recommended by PT     Functional Status Assessment Patient has had a recent decline in their functional status and demonstrates the ability to make significant improvements in function in a reasonable and predictable amount of time.     Precautions / Restrictions Precautions Precautions: Fall Precaution Comments: R back stent Restrictions Weight  Bearing Restrictions: No      Mobility  Bed Mobility  General bed mobility comments: in recliner upon arrival    Transfers Overall transfer level: Needs assistance Equipment used: Rolling walker (2 wheels) Transfers: Sit to/from Stand Sit to Stand: Contact guard assist      General transfer comment: CGA for safety    Ambulation/Gait Ambulation/Gait assistance: Supervision Gait Distance (Feet): 100 Feet Assistive device: Rolling walker (2 wheels) Gait Pattern/deviations: Step-through pattern Gait velocity: dec     General Gait Details: slow and steady gait         Balance Overall balance assessment: Needs assistance Sitting-balance support: Feet supported, No upper extremity supported Sitting balance-Leahy Scale: Fair     Standing balance support: Bilateral upper extremity supported, During functional activity, Reliant on assistive device for balance Standing balance-Leahy Scale: Poor Standing balance comment: reliant on RW        Pertinent Vitals/Pain Pain Assessment Pain Assessment: Faces Faces Pain Scale: Hurts little more Pain Location: R abdomen Pain Descriptors / Indicators: Aching, Discomfort Pain Intervention(s): Limited activity within patient's tolerance, Premedicated before session, Monitored during session, Repositioned    Home Living Family/patient expects to be discharged to:: Private residence Living Arrangements: Alone Available Help at Discharge: Family;Available 24 hours/day Type of Home: House Home Access: Stairs to enter Entrance Stairs-Rails: None Entrance Stairs-Number of Steps: 2   Home Layout: One level Home Equipment: Agricultural consultant (2 wheels);Rollator (4 wheels);Cane - single point;BSC/3in1;Wheelchair - manual;Grab bars - toilet;Grab bars - tub/shower;Shower seat      Prior Function Prior Level of Function : Independent/Modified Independent;Needs assist;History of Falls (last six months)       Physical Assist : Mobility  (physical);ADLs (physical)  Mobility (physical): Transfers;Stairs ADLs (physical): IADLs Mobility Comments: RW for short distances. Pt sleeps in lift chair, however, doesn't always need help to stand. 2 falls, 1 time where she slid out of the chair and 1 time where she tripped. Family always assists with stairs ADLs Comments: Daughter helps with financial managment, drives. Pt cooks breakfast. Reports difficulty putting on socks     Extremity/Trunk Assessment   Upper Extremity Assessment Upper Extremity Assessment: Defer to OT evaluation    Lower Extremity Assessment Lower Extremity Assessment: Generalized weakness    Cervical / Trunk Assessment Cervical / Trunk Assessment: Normal  Communication   Communication Communication: No apparent difficulties  Cognition Arousal: Alert Behavior During Therapy: WFL for tasks assessed/performed Overall Cognitive Status: Within Functional Limits for tasks assessed     General Comments General comments (skin integrity, edema, etc.): VSS on RA     PT Assessment Patient needs continued PT services  PT Problem List Decreased strength;Decreased activity tolerance;Decreased balance;Decreased mobility;Decreased knowledge of use of DME           PT Goals (Current goals can be found in the Care Plan section)  Acute Rehab PT Goals Patient Stated Goal: to go home PT Goal Formulation: With patient/family Time For Goal Achievement: 03/01/23 Potential to Achieve Goals: Good    Frequency Min 1X/week        AM-PAC PT "6 Clicks" Mobility  Outcome Measure Help needed turning from your back to your side while in a flat bed without using bedrails?: A Little Help needed moving from lying on your back to sitting on the side of a flat bed without using bedrails?: A Little Help needed moving to and from a bed to a chair (including a wheelchair)?: A Little Help needed standing up from a chair using your arms (e.g., wheelchair or bedside chair)?: A  Little Help needed to walk in hospital room?: A Little Help needed climbing 3-5 steps with a railing? : A Lot 6 Click Score: 17    End of Session Equipment Utilized During Treatment: Gait belt Activity Tolerance: Patient tolerated treatment well Patient left: in chair;with call bell/phone within reach;with family/visitor present Nurse Communication: Mobility status PT Visit Diagnosis: Unsteadiness on feet (R26.81);Muscle weakness (generalized) (M62.81);History of falling (Z91.81)    Time: 1650-1720 PT Time Calculation (min) (ACUTE ONLY): 30 min   Charges:   PT Evaluation $PT Eval Low Complexity: 1 Low PT Treatments $Gait Training: 8-22 mins PT General Charges $$ ACUTE PT VISIT: 1 Visit         Hilton Cork, PT, DPT Secure Chat Preferred  Rehab Office [phone]  Arturo Morton Brion Aliment 02/15/2023, 5:33 PM

## 2023-02-16 ENCOUNTER — Inpatient Hospital Stay (HOSPITAL_COMMUNITY): Payer: PPO

## 2023-02-16 ENCOUNTER — Telehealth: Payer: Self-pay | Admitting: Urology

## 2023-02-16 DIAGNOSIS — N200 Calculus of kidney: Secondary | ICD-10-CM | POA: Diagnosis not present

## 2023-02-16 DIAGNOSIS — R319 Hematuria, unspecified: Secondary | ICD-10-CM | POA: Diagnosis not present

## 2023-02-16 DIAGNOSIS — M79662 Pain in left lower leg: Secondary | ICD-10-CM

## 2023-02-16 DIAGNOSIS — M549 Dorsalgia, unspecified: Secondary | ICD-10-CM | POA: Diagnosis not present

## 2023-02-16 DIAGNOSIS — N390 Urinary tract infection, site not specified: Secondary | ICD-10-CM | POA: Diagnosis not present

## 2023-02-16 LAB — COMPREHENSIVE METABOLIC PANEL
ALT: 27 U/L (ref 0–44)
AST: 26 U/L (ref 15–41)
Albumin: 2.9 g/dL — ABNORMAL LOW (ref 3.5–5.0)
Alkaline Phosphatase: 51 U/L (ref 38–126)
Anion gap: 5 (ref 5–15)
BUN: 21 mg/dL (ref 8–23)
CO2: 29 mmol/L (ref 22–32)
Calcium: 8.5 mg/dL — ABNORMAL LOW (ref 8.9–10.3)
Chloride: 103 mmol/L (ref 98–111)
Creatinine, Ser: 1.45 mg/dL — ABNORMAL HIGH (ref 0.44–1.00)
GFR, Estimated: 35 mL/min — ABNORMAL LOW (ref >60)
Glucose, Bld: 187 mg/dL — ABNORMAL HIGH (ref 70–99)
Potassium: 3.7 mmol/L (ref 3.5–5.1)
Sodium: 137 mmol/L (ref 135–145)
Total Bilirubin: 0.5 mg/dL (ref <1.2)
Total Protein: 5.9 g/dL — ABNORMAL LOW (ref 6.5–8.1)

## 2023-02-16 LAB — GLUCOSE, CAPILLARY
Glucose-Capillary: 153 mg/dL — ABNORMAL HIGH (ref 70–99)
Glucose-Capillary: 171 mg/dL — ABNORMAL HIGH (ref 70–99)
Glucose-Capillary: 180 mg/dL — ABNORMAL HIGH (ref 70–99)
Glucose-Capillary: 187 mg/dL — ABNORMAL HIGH (ref 70–99)
Glucose-Capillary: 203 mg/dL — ABNORMAL HIGH (ref 70–99)
Glucose-Capillary: 234 mg/dL — ABNORMAL HIGH (ref 70–99)
Glucose-Capillary: 241 mg/dL — ABNORMAL HIGH (ref 70–99)

## 2023-02-16 LAB — CBC
HCT: 30.7 % — ABNORMAL LOW (ref 36.0–46.0)
Hemoglobin: 9.7 g/dL — ABNORMAL LOW (ref 12.0–15.0)
MCH: 31.9 pg (ref 26.0–34.0)
MCHC: 31.6 g/dL (ref 30.0–36.0)
MCV: 101 fL — ABNORMAL HIGH (ref 80.0–100.0)
Platelets: 208 10*3/uL (ref 150–400)
RBC: 3.04 MIL/uL — ABNORMAL LOW (ref 3.87–5.11)
RDW: 13.1 % (ref 11.5–15.5)
WBC: 5.3 10*3/uL (ref 4.0–10.5)
nRBC: 0 % (ref 0.0–0.2)

## 2023-02-16 MED ORDER — SODIUM CHLORIDE 0.9 % IV SOLN
1.0000 g | Freq: Every day | INTRAVENOUS | Status: AC
  Administered 2023-02-17 – 2023-02-18 (×2): 1 g via INTRAVENOUS
  Filled 2023-02-16 (×2): qty 10

## 2023-02-16 MED ORDER — DICLOFENAC SODIUM 1 % EX GEL
2.0000 g | Freq: Four times a day (QID) | CUTANEOUS | Status: DC
  Administered 2023-02-16 – 2023-02-19 (×12): 2 g via TOPICAL
  Filled 2023-02-16: qty 100

## 2023-02-16 MED ORDER — FUROSEMIDE 40 MG PO TABS
40.0000 mg | ORAL_TABLET | Freq: Every day | ORAL | Status: DC
  Administered 2023-02-17 – 2023-02-19 (×3): 40 mg via ORAL
  Filled 2023-02-16 (×3): qty 1

## 2023-02-16 MED ORDER — BISACODYL 10 MG RE SUPP
10.0000 mg | Freq: Once | RECTAL | Status: AC
  Administered 2023-02-16: 10 mg via RECTAL
  Filled 2023-02-16: qty 1

## 2023-02-16 MED ORDER — SULFAMETHOXAZOLE-TRIMETHOPRIM 400-80 MG PO TABS
1.0000 | ORAL_TABLET | Freq: Every day | ORAL | Status: DC
  Administered 2023-02-16 – 2023-02-19 (×4): 1 via ORAL
  Filled 2023-02-16 (×4): qty 1

## 2023-02-16 MED ORDER — SODIUM CHLORIDE 0.9 % IV SOLN
1.0000 g | INTRAVENOUS | Status: AC
  Administered 2023-02-16: 1 g via INTRAVENOUS
  Filled 2023-02-16: qty 10

## 2023-02-16 MED ORDER — DOCUSATE SODIUM 100 MG PO CAPS
100.0000 mg | ORAL_CAPSULE | Freq: Two times a day (BID) | ORAL | Status: DC
  Administered 2023-02-16 – 2023-02-19 (×6): 100 mg via ORAL
  Filled 2023-02-16 (×7): qty 1

## 2023-02-16 NOTE — Progress Notes (Signed)
Patient in the bathroom, this writer was called and patient complaining of leg pain, no redness noted but patient says "I feel a knot on my leg". This writer try to palpate the leg and noted a knot. MD made ware.

## 2023-02-16 NOTE — Progress Notes (Signed)
   02/16/23 2055  BiPAP/CPAP/SIPAP  Reason BIPAP/CPAP not in use Non-compliant (Pt adamantly refused. Stated she did not want it and would not wear)  BiPAP/CPAP /SiPAP Vitals  Pulse Rate 61  Resp 19  SpO2 100 %  Bilateral Breath Sounds Clear;Diminished  MEWS Score/Color  MEWS Score 0  MEWS Score Color Green

## 2023-02-16 NOTE — Progress Notes (Signed)
Daughter question about IV antibiotic. Chart checked and noted  that IV antibiotics was disconued. Md made ware.

## 2023-02-16 NOTE — Evaluation (Signed)
Occupational Therapy Evaluation Patient Details Name: Danielle [name] MRN: [id number] DOB: 1934/02/23 Today's Date: 02/16/2023   History of Present Illness 89 y.o. female presents to [facility] 02/13/23 with severe back pain, dysuria, and difficulty urinating. Pt with B nonobstructing renal calculi, proximal R uretal stone, and UTI. S/p cystoscopy, R retrograde w/ interpretation and placement of R DJ stent. PMHx: COPD, chronic respiratory failure with hypoxia on home oxygen, coronary artery disease, morbid obesity, diabetes mellitus type 2, chronic kidney disease stage IIIa, hyperlipidemia.   Clinical Impression   PTA patient independent with ADLs, mobility using RW; family assisting with IADls as needed. Admitted for above and presents with problem list below. Patient requires min assist for LB dressing, supervision for transfers using RW.  She reports daughter can assist as needed at dc, but she would prefer not to have 24/7 support.  Limited session, as assisted back to bed for ultrasound (MD verbal order- Dr. Rhona [name], cleared for mobilization before this assessment). Based on performance today, would recommend continued OT services acutely after after dc at [facility] level to optimize independence, safety and return to PLOF.       If plan is discharge home, recommend the following: A little help with walking and/or transfers;A little help with bathing/dressing/bathroom;Assistance with cooking/housework;Assist for transportation;Help with stairs or ramp for entrance    Functional Status Assessment  Patient has had a recent decline in their functional status and demonstrates the ability to make significant improvements in function in a reasonable and predictable amount of time.  Equipment Recommendations       Recommendations for Other Services       Precautions / Restrictions Precautions Precautions: Fall Precaution Comments: R back stent Restrictions Weight Bearing Restrictions: No       Mobility Bed Mobility Overal bed mobility: Needs Assistance Bed Mobility: Sit to Supine       Sit to supine: Supervision   General bed mobility comments: in recliner, assisted back to bed with supervision    Transfers Overall transfer level: Needs assistance Equipment used: Rolling walker (2 wheels) Transfers: Sit to/from Stand Sit to Stand: Supervision           General transfer comment: for safety      Balance Overall balance assessment: Needs assistance Sitting-balance support: No upper extremity supported, Feet supported Sitting balance-Leahy Scale: Fair     Standing balance support: Bilateral upper extremity supported, During functional activity Standing balance-Leahy Scale: Poor Standing balance comment: reliant on RW                           ADL either performed or assessed with clinical judgement   ADL Overall ADL's : Needs assistance/impaired     Grooming: Set up;Sitting           Upper Body Dressing : Set up;Sitting   Lower Body Dressing: Minimal assistance;Sit to/from stand Lower Body Dressing Details (indicate cue type and reason): for socks, reports daughter assists as needed Toilet Transfer: Supervision/safety;Ambulation;Rolling walker (2 wheels) Toilet Transfer Details (indicate cue type and reason): chair to bed         Functional mobility during ADLs: Supervision/safety;Rolling walker (2 wheels)       Vision   Vision Assessment?: No apparent visual deficits     Perception         Praxis         Pertinent Vitals/Pain Pain Assessment Pain Assessment: Faces Faces Pain Scale: Hurts a little bit Pain Location:  R calf Pain Descriptors / Indicators: Aching, Discomfort Pain Intervention(s): Limited activity within patient's tolerance, Monitored during session, Repositioned     Extremity/Trunk Assessment Upper Extremity Assessment Upper Extremity Assessment: Generalized weakness   Lower Extremity  Assessment Lower Extremity Assessment: Defer to PT evaluation       Communication Communication Communication: No apparent difficulties   Cognition Arousal: Alert Behavior During Therapy: WFL for tasks assessed/performed Overall Cognitive Status: Within Functional Limits for tasks assessed                                       General Comments  daughter at side, utlrasound arrived for testing- limited session as assisted pt back to bed    Exercises     Shoulder Instructions      Home Living Family/patient expects to be discharged to:: Private residence Living Arrangements: Alone Available Help at Discharge: Family;Available PRN/intermittently Type of Home: House Home Access: Stairs to enter [employer] of Steps: 2 Entrance Stairs-Rails: None Home Layout: One level     Bathroom Shower/Tub: Producer, television/film/video: Standard     Home Equipment: Agricultural consultant (2 wheels);Rollator (4 wheels);Cane - single point;BSC/3in1;Wheelchair - manual;Grab bars - toilet;Grab bars - tub/shower;Shower seat   Additional Comments: daugther can stay 24/7 if needed but pt would prefer her to just check on her      Prior Functioning/Environment Prior Level of Function : History of Falls (last six months);Needs assist             Mobility Comments: RW for short distances. Pt sleeps in lift chair, however, doesn't always need help to stand. 2 falls, 1 time where she slid out of the chair and 1 time where she tripped. Family always assists with stairs ADLs Comments: Independent ADLs, pt manages breakfast and daughter assists with remainder of meals.  Not driving, daughter assists with medications.        OT Problem List: Decreased strength;Decreased activity tolerance;Impaired balance (sitting and/or standing);Pain;Decreased knowledge of use of DME or AE;Obesity      OT Treatment/Interventions: Self-care/ADL training;DME and/or AE  instruction;Therapeutic activities;Patient/family education;Balance training;Energy conservation    OT Goals(Current goals can be found in the care plan section) Acute Rehab OT Goals Patient Stated Goal: home OT Goal Formulation: With patient Time For Goal Achievement: 03/02/23 Potential to Achieve Goals: Good  OT Frequency: Min 1X/week    Co-evaluation              AM-PAC OT "6 Clicks" Daily Activity     Outcome Measure Help from another person eating meals?: None Help from another person taking care of personal grooming?: A Little Help from another person toileting, which includes using toliet, bedpan, or urinal?: A Little Help from another person bathing (including washing, rinsing, drying)?: A Little Help from another person to put on and taking off regular upper body clothing?: A Little Help from another person to put on and taking off regular lower body clothing?: A Little 6 Click Score: 19   End of Session Equipment Utilized During Treatment: Rolling walker (2 wheels) Nurse Communication: Mobility status  Activity Tolerance: Patient tolerated treatment well Patient left: with call bell/phone within reach;in bed;with family/visitor present  OT Visit Diagnosis: Other abnormalities of gait and mobility (R26.89);Muscle weakness (generalized) (M62.81);History of falling (Z91.81)                Time: 1610-9604 OT  Time Calculation (min): 20 min Charges:  OT General Charges $OT Visit: 1 Visit OT Evaluation $OT Eval Moderate Complexity: 1 Mod  Barry Brunner, OT Acute Rehabilitation Services Office [phone]   Chancy Milroy 02/16/2023, 1:33 PM

## 2023-02-16 NOTE — Progress Notes (Signed)
PT Cancellation Note  Patient Details Name: TERISE FORET MRN: [id number] DOB: [birth year]   Cancelled Treatment:    Reason Eval/Treat Not Completed: (P) Medical issues which prohibited therapy (Pt with new DVT per chart review despite being on Eliquis, hold PT per Dr Rhona Leavens this evening.) Will continue efforts next date per PT plan of care once medically appropriate.   Dorathy Kinsman Haniya Fern 02/16/2023, 5:48 PM

## 2023-02-16 NOTE — Telephone Encounter (Signed)
Daughter wants to know if blood in urine today is normal. Was going to let her come home but dr said her kidney function is weaker today and has clot in her leg.

## 2023-02-16 NOTE — Progress Notes (Signed)
VASCULAR LAB    Left lower extremity venous duplex has been performed.  See CV proc for preliminary results.   Tanith Dagostino, RVT 02/16/2023, 12:43 PM

## 2023-02-16 NOTE — Progress Notes (Signed)
  Progress Note   Patient: Danielle [name] JWJ:[id number] DOB: 01-29-1934 DOA: 02/13/2023     2 DOS: the patient was seen and examined on 02/16/2023   Brief hospital course: 89yo with hx OA, chronic diastolic CHF, CKD3, CAD, COPD, HTN, HLD, afib on anticoagulation who presented severe back pain and dysuria, found to have renal stone and UTI.   Assessment and Plan: Symptomatic right renal calculus with fullness right renal pelvis -now s/p cystoscopy -discussed with Urology. OK to resume eliquis -OK to d/c home per Urology with regular dose bactrim after completing current course of abx for UTI -some hematuria noted this AM. Hgb stable  UTI -Urine cx neg -would treat 5 days of abx, then regular strength bactrim daily or suppression per Urology recs   Chronic atrial fibrillation on Eliquis -Currently rate controlled -continue eliquis for now, see below   Chronic diastolic heart failure -continue lasix as tolerated -continues to void   COPD on home oxygen Continue supportive care with oxygen and home MDI/nebulizer   Diabetes mellitus 2 Cont to hold Amaryl and Januvia while in hospital Currently on SSI as needed   Dyslipidemia Continue Crestor   History of depression Continue Celexa and clonazepam at bedtime for chronic insomnia  Acute RLE DVT -acute RLE pain this AM. Pt does have baseline chronic BLE pains -Did order and reviewed LE dopplers. Finding of acute RLE DVT -Already on eliquis. Will discuss with Hematology regarding any anticoagulation changes if needed   Subjective: Complaining of worse RLE pain  Physical Exam: Vitals:   02/16/23 0328 02/16/23 0332 02/16/23 0728 02/16/23 1643  BP: (!) 118/55  (!) 124/53 (!) 140/60  Pulse: (!) 56  (!) 57 60  Resp: 17  18 18   Temp: 97.8 F (36.6 C)  97.9 F (36.6 C) 98 F (36.7 C)  TempSrc: Oral  Oral Oral  SpO2: 98%  97% 93%  Weight:  110.3 kg    Height:       General exam: Conversant, in no acute  distress Respiratory system: normal chest rise, clear, no audible wheezing Cardiovascular system: regular rhythm, s1-s2 Gastrointestinal system: Nondistended, nontender, pos BS Central nervous system: No seizures, no tremors Extremities: No cyanosis, no joint deformities, very tender BLE, worse on R Skin: No rashes, no pallor Psychiatry: Affect normal // no auditory hallucinations   Data Reviewed:  Labs reviewed: Na 137, K 3.7, Cr 1.45  Family Communication: Pt in room, family at bedside  Disposition: Status is: Inpatient Remains inpatient appropriate because: severity of illness  Planned Discharge Destination: Home    Author: Rickey Barbara, MD 02/16/2023 6:21 PM  For on call review [email or web address].

## 2023-02-16 NOTE — TOC Initial Note (Signed)
Transition of Care (TOC) - Initial/Assessment Note   Spoke to patient and daughter at bedside.   Patient from home. Daughter assists.   Patient has home oxygen through [employer]. Wears it at night and in daytime only when needed. She has a Programmer, systems. Daughter had billing questions with [employer]. Mitch with [employer] will email billing department at [employer] and have them reach out to daughter.   PT recommending home health PT. Provided medicare.gov list. Patient has had [facility] in the past and would like them again if possible. Amy with [facility] accepted referral for HHPT. NCM asked MD to sign orders   Patient Details  Name: Danielle [name] MRN: [id number] Date of Birth: 08-30-1933  Transition of Care [facility]) CM/SW Contact:    Kingsley Plan, RN Phone Number: 02/16/2023, 1:13 PM  Clinical Narrative:                   Expected Discharge Plan: Home w Home Health Services Barriers to Discharge: Continued Medical Work up   Patient Goals and CMS Choice Patient states their goals for this hospitalization and ongoing recovery are:: to return to home CMS Medicare.gov Compare Post Acute Care list provided to:: Patient Choice offered to / list presented to : Patient, Adult Children She[facility]on ownership interest in [facility].provided to:: Patient    Expected Discharge Plan and Services   Discharge Planning Services: CM Consult Post Acute Care Choice: Home Health Living arrangements for the past 2 months: Single Family Home                 DME Arranged: N/A         HH Arranged: PT HH Agency: [facility] Date HH Agency Contacted: 02/16/23 Time HH Agency Contacted: 1313 Representative spoke with at [facility] Agency: Amy  Prior Living Arrangements/Services Living arrangements for the past 2 months: Single Family Home Lives with:: Self Patient language and need for interpreter reviewed:: Yes Do you feel safe going back to the place where you live?: Yes       Need for Family Participation in Patient Care: Yes (Comment) Care giver support system in place?: Yes (comment) Current home services: DME Criminal Activity/Legal Involvement Pertinent to Current Situation/Hospitalization: No - Comment as needed  Activities of Daily Living   ADL Screening (condition at time of admission) Independently performs ADLs?: Yes (appropriate for developmental age) Is the patient deaf or have difficulty hearing?: No Does the patient have difficulty seeing, even when wearing glasses/contacts?: Yes Does the patient have difficulty concentrating, remembering, or making decisions?: Yes  Permission Sought/Granted   Permission granted to share information with : Yes, Verbal Permission Granted  Share Information with NAME: daughter Karoline Caldwell  Permission granted to share info w AGENCY: [facility], [facility]        Emotional Assessment Appearance:: Appears stated age Attitude/Demeanor/Rapport: Engaged Affect (typically observed): Accepting Orientation: : Oriented to Self, Oriented to Place, Oriented to  Time, Oriented to Situation Alcohol / Substance Use: Not Applicable Psych Involvement: No (comment)  Admission diagnosis:  Nephrolithiasis [N20.0] Kidney stone on right side [N20.0] Urinary tract infection with hematuria, site unspecified [N39.0, R31.9] Acute bilateral back pain, unspecified back location [M54.9] Patient Active Problem List   Diagnosis Date Noted   Nephrolithiasis 02/14/2023   Peripheral polyneuropathy 09/10/2022   Claudication (HCC) 09/10/2022   Subclinical hypothyroidism 03/11/2022   Atrial fibrillation (HCC) 02/28/2022   Paroxysmal atrial fibrillation (HCC) 02/27/2022   Acute on chronic diastolic CHF (congestive heart failure) (HCC)  02/27/2022   Multifocal atrial tachycardia (HCC) 02/26/2022   Coronary artery disease    PONV (postoperative nausea and vomiting)    Osteoporosis    Insomnia    Hypertension    Hyperlipidemia    History of hiatal  hernia    Headache    GERD (gastroesophageal reflux disease)    Emphysema lung (HCC)    COPD (chronic obstructive pulmonary disease) (HCC)    CHF (congestive heart failure) (HCC)    Asthma    Arthritis    Anginal pain (HCC)    Callus 05/30/2020   Flat feet, bilateral 05/30/2020   Hammer toe of left foot 05/30/2020   OSA (obstructive sleep apnea) 12/16/2019   Internal hemorrhoids 11/26/2018   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/26/2018   Unstable angina (HCC) 10/30/2018   Positive D-dimer 09/08/2018   DOE (dyspnea on exertion) 09/07/2018   Class 3 obesity 09/07/2018   Edema 08/12/2018   Iron deficiency anemia 08/12/2018   Senile purpura (HCC) 08/12/2018   Strain of right shoulder 08/12/2018   Dysphagia 08/12/2018   Chronic pain of both knees 05/14/2018   Stasis edema of left lower extremity 04/13/2018   Vitamin B12 deficiency 04/13/2018   Restless legs syndrome 03/04/2018   At high risk for falls 11/11/2017   Gastroesophageal reflux disease without esophagitis 11/11/2017   Stage 3a chronic kidney disease (HCC) 05/07/2017   Chronic respiratory failure with hypoxia (HCC) 05/05/2017   Vitamin D deficiency 02/27/2016   Nocturnal hypoxemia due to emphysema (HCC) 12/04/2015   Midsternal chest pain 11/11/2015   Chronic diastolic congestive heart failure (HCC) 11/11/2015   Chest pain 11/09/2015   Depression 11/09/2015   Panlobular emphysema (HCC) 11/09/2015   Elevated blood sugar 11/09/2015   Chronic insomnia 10/24/2015   Type 2 diabetes mellitus with hyperlipidemia (HCC) 08/14/2015   Primary osteoarthritis involving multiple joints 08/14/2015   Malaise and fatigue 08/14/2015   Degenerative lumbar disc 08/14/2015   Abnormal gait 08/14/2015   Mesenteric mass 02/19/2015   Coronary artery disease involving native coronary artery of native heart without angina pectoris 11/14/2014   Mixed hyperlipidemia 11/14/2014   Current mild episode of major depressive disorder (HCC) 11/14/2014   Pneumonia 2013    PCP:  Gordan Payment., MD Pharmacy:   [facility] [address] [place], [place] - [address] [address] [place] [address] Phone: [phone] Fax: [phone]  [facility] [address] [place] [address] Phone: [phone] Fax: [phone]     Social Determinants of Health (SDOH) Social History: SDOH Screenings   Food Insecurity: No Food Insecurity (02/14/2023)  Housing: Low Risk  (02/14/2023)  Transportation Needs: No Transportation Needs (02/14/2023)  Utilities: Not At Risk (02/14/2023)  Tobacco Use: Medium Risk (02/14/2023)   SDOH Interventions:     Readmission Risk Interventions     No data to display

## 2023-02-16 NOTE — Progress Notes (Signed)
Patient c/o of not being able to void. Helped patient ambulate to bathroom. Random bladder scan performed after voiding. Bladder scan reads 116 ml.

## 2023-02-17 ENCOUNTER — Other Ambulatory Visit (HOSPITAL_COMMUNITY): Payer: Self-pay

## 2023-02-17 DIAGNOSIS — N390 Urinary tract infection, site not specified: Secondary | ICD-10-CM | POA: Diagnosis not present

## 2023-02-17 DIAGNOSIS — R319 Hematuria, unspecified: Secondary | ICD-10-CM | POA: Diagnosis not present

## 2023-02-17 DIAGNOSIS — M549 Dorsalgia, unspecified: Secondary | ICD-10-CM | POA: Diagnosis not present

## 2023-02-17 DIAGNOSIS — N200 Calculus of kidney: Secondary | ICD-10-CM

## 2023-02-17 LAB — GLUCOSE, CAPILLARY
Glucose-Capillary: 218 mg/dL — ABNORMAL HIGH (ref 70–99)
Glucose-Capillary: 153 mg/dL — ABNORMAL HIGH (ref 70–99)
Glucose-Capillary: 204 mg/dL — ABNORMAL HIGH (ref 70–99)
Glucose-Capillary: 216 mg/dL — ABNORMAL HIGH (ref 70–99)
Glucose-Capillary: 263 mg/dL — ABNORMAL HIGH (ref 70–99)
Glucose-Capillary: 264 mg/dL — ABNORMAL HIGH (ref 70–99)

## 2023-02-17 LAB — CBC
HCT: 31 % — ABNORMAL LOW (ref 36.0–46.0)
Hemoglobin: 9.8 g/dL — ABNORMAL LOW (ref 12.0–15.0)
MCH: 30.8 pg (ref 26.0–34.0)
MCHC: 31.6 g/dL (ref 30.0–36.0)
MCV: 97.5 fL (ref 80.0–100.0)
Platelets: 203 10*3/uL (ref 150–400)
RBC: 3.18 MIL/uL — ABNORMAL LOW (ref 3.87–5.11)
RDW: 12.8 % (ref 11.5–15.5)
WBC: 5.1 10*3/uL (ref 4.0–10.5)
nRBC: 0 % (ref 0.0–0.2)

## 2023-02-17 LAB — COMPREHENSIVE METABOLIC PANEL
ALT: 35 U/L (ref 0–44)
AST: 49 U/L — ABNORMAL HIGH (ref 15–41)
Albumin: 3 g/dL — ABNORMAL LOW (ref 3.5–5.0)
Alkaline Phosphatase: 53 U/L (ref 38–126)
Anion gap: 8 (ref 5–15)
BUN: 18 mg/dL (ref 8–23)
CO2: 24 mmol/L (ref 22–32)
Calcium: 8.3 mg/dL — ABNORMAL LOW (ref 8.9–10.3)
Chloride: 104 mmol/L (ref 98–111)
Creatinine, Ser: 1.15 mg/dL — ABNORMAL HIGH (ref 0.44–1.00)
GFR, Estimated: 46 mL/min — ABNORMAL LOW (ref >60)
Glucose, Bld: 250 mg/dL — ABNORMAL HIGH (ref 70–99)
Potassium: 4.6 mmol/L (ref 3.5–5.1)
Sodium: 136 mmol/L (ref 135–145)
Total Bilirubin: 0.8 mg/dL (ref <1.2)
Total Protein: 5.7 g/dL — ABNORMAL LOW (ref 6.5–8.1)

## 2023-02-17 MED ORDER — INSULIN GLARGINE-YFGN 100 UNIT/ML ~~LOC~~ SOLN
10.0000 [IU] | Freq: Every day | SUBCUTANEOUS | Status: DC
  Administered 2023-02-17 – 2023-02-19 (×3): 10 [IU] via SUBCUTANEOUS
  Filled 2023-02-17 (×3): qty 0.1

## 2023-02-17 MED ORDER — RIVAROXABAN 20 MG PO TABS
20.0000 mg | ORAL_TABLET | Freq: Every day | ORAL | Status: DC
Start: 2023-02-17 — End: 2023-02-19
  Administered 2023-02-17 – 2023-02-18 (×2): 20 mg via ORAL
  Filled 2023-02-17 (×2): qty 1

## 2023-02-17 NOTE — Telephone Encounter (Signed)
Spoke with daughter Karoline Caldwell, she is aware that the blood in the urine is normal with a stent. Pt was also diagnosed with a DVT in her leg. [name] stated that they are going to change pt's blood thinning medication.

## 2023-02-17 NOTE — Consult Note (Signed)
[facility]  [phone] Fax:([phone]   MEDICAL ONCOLOGY - INITIAL CONSULTATION    Referral MD  Reason for Referral: Acute DVT, ? Eliquis failure.  Chief Complaint  Patient presents with   Abdominal Pain   Shortness of Breath   Assessment and Plan:   This is a pleasant 89 year old past medical history significant for CKD, congestive heart failure, A-fib on anticoagulation admitted with obstruction and infection, had cystoscopy, stent placement and retrograde pyelogram, anticoagulation held for the procedure and soon after the procedure noted right lower extremity swelling and was found to have an acute DVT in the right posterior tibial vein and peroneal vein, hematology consulted to review if he failed Eliquis.  Patient was compliant with Eliquis prior to hospitalization, the anticoagulation was held for the procedure and during her immediate post procedure period developed right lower extremity pain and was found to have acute DVT in the posterior tibial and peroneal vein.  She did not have any prior history of DVT or PE.  She does have chronic bilateral lower extremity swelling.  At this time we have discussed that it may not be failure of anticoagulation since she was off of Eliquis for approximately 1.5 days,  however we have discussed about considering an alternate DOAC such as Xarelto without any loading, 20 mg p.o. daily. I have discussed that if she continues to have any other episodes of DVT/PE while on Xarelto, she may have to consider warfarin or Lovenox.  At this time I would recommend continuing Xarelto at the time of discharge and we will arrange for outpatient follow-up.  She is agreeable to this recommendation.  The length of time of the face-to-face encounter was 55 minutes. More than 50% of time was spent counseling and coordination of care.     Thank you for this referral.     HPI:   This is a pleasant 76 patient with  history of osteoarthritis, chronic diastolic congestive heart failure CKD, A-fib on anticoagulation who presented to the ER with severe back pain, dysuria found to have a UTI and renal stone.  He had cystoscopy with retrograde pyelogram, ureteroscopy and stent placement. Anticoagulation was held for the procedure. He had lower extremity swelling yesterday and had a DVT study which showed acute DVT involving mid portions of right posterior tibial vein and right peroneal vein at site of pain.    Hematology was consulted since there was a question of Eliquis failure. Patient was alert, sitting in a chair, denies any prior history of DVT.  She has chronic bilateral lower extremity swelling but was noticing some increased pain in the right leg which prompted the vascular ultrasound.  She takes multiple diuretics, needs oxygen at 24 hours and has severe shortness of breath with exertion related to her congestive heart failure.  She has no increasing shortness of breath that she reported today.  She otherwise is doing okay.  Rest of the pertinent review of systems reviewed and negative  Past Medical History:  Diagnosis Date   Anginal pain (HCC)    Arthritis    Asthma    At high risk for falls 11/11/2017   Chest pain 11/09/2015   CHF (congestive heart failure) (HCC)    Chronic diastolic congestive heart failure (HCC) 11/11/2015   Chronic insomnia 10/24/2015   Chronic pain of both knees 05/14/2018   Chronic respiratory failure with hypoxia (HCC) 05/05/2017   CKD (chronic kidney disease) stage 3, GFR 30-59 ml/min (HCC) 05/07/2017  COPD (chronic obstructive pulmonary disease) (HCC)    Coronary artery disease    Coronary artery disease involving native coronary artery of native heart without angina pectoris 11/14/2014   Overview:  40% RCA a cardiac catheter in 2014   Current mild episode of major depressive disorder (HCC) 11/14/2014   Depression    DOE (dyspnea on exertion) 09/07/2018   Dysphagia  08/12/2018   Edema 08/12/2018   Elevated blood sugar 11/09/2015   Emphysema lung (HCC)    Flat feet, bilateral 05/30/2020   Gastroesophageal reflux disease without esophagitis 11/11/2017   GERD (gastroesophageal reflux disease)    Headache    History of hiatal hernia    Hyperlipidemia    Hypertension    Insomnia    Internal hemorrhoids 11/26/2018   Iron deficiency anemia 08/12/2018   Malaise and fatigue 08/14/2015   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/26/2018   Mesenteric mass 02/19/2015   Midsternal chest pain 11/11/2015   Mixed hyperlipidemia 11/14/2014   Morbid obesity (HCC) 09/07/2018   OSA (obstructive sleep apnea) 12/16/2019   Osteoporosis    Panlobular emphysema (HCC) 11/09/2015   Pneumonia 2013   PONV (postoperative nausea and vomiting)    Positive D-dimer 09/08/2018   Primary osteoarthritis involving multiple joints 08/14/2015   Restless legs syndrome 03/04/2018   Senile purpura (HCC) 08/12/2018   Shortness of breath dyspnea    with exertion   Stasis edema of left lower extremity 04/13/2018   Strain of right shoulder 08/12/2018   Type 2 diabetes mellitus with stage 3 chronic kidney disease (HCC) 08/14/2015   Unstable angina (HCC) 10/30/2018   Vitamin B12 deficiency 04/13/2018   Vitamin D deficiency 02/27/2016  :   Past Surgical History:  Procedure Laterality Date   ABDOMINAL HYSTERECTOMY     APPENDECTOMY     BREAST SURGERY  40 yrs. ago   growth removed in each breast-benign   CARDIAC CATHETERIZATION     12/14/12 LHC ([facility]): few mild stenotic areas max 40% of ectatic RCA o/w NL coronaries, EF 65%. Med tx.   CATARACT EXTRACTION W/ INTRAOCULAR LENS  IMPLANT, BILATERAL Bilateral 15 yrs ago   CHOLECYSTECTOMY     COLONOSCOPY  04/22/2013   Diverticulosis in the sigmoid colon. Non bleeding internal hemorrhoids. Normal mucosa vascular pattern in the entire examined colon. Three 6 mm polyps n the descending colon. Resected and retrieved.    CYSTOSCOPY WITH RETROGRADE PYELOGRAM,  URETEROSCOPY AND STENT PLACEMENT Right 02/14/2023   Procedure: CYSTOSCOPY WITH RETROGRADE PYELOGRAM, URETEROSCOPY AND STENT PLACEMENT;  Surgeon: Joline Maxcy, MD;  Location: [facility] OR;  Service: Urology;  Laterality: Right;   FOOT SURGERY     LAPAROSCOPIC APPENDECTOMY N/A 02/19/2015   Procedure: EXCISION OF MESENTERIC MASS;  Surgeon: Almond Lint, MD;  Location: [facility] OR;  Service: General;  Laterality: N/A;   REPLACEMENT TOTAL KNEE BILATERAL    :   Current Facility-Administered Medications  Medication Dose Route Frequency Provider Last Rate Last Admin   acetaminophen (TYLENOL) tablet 650 mg  650 mg Oral Q6H PRN Russella Dar, NP   650 mg at 02/16/23 2119   Or   acetaminophen (TYLENOL) suppository 650 mg  650 mg Rectal Q6H PRN Russella Dar, NP       albuterol (PROVENTIL) (2.5 MG/3ML) 0.083% nebulizer solution 2.5 mg  2.5 mg Nebulization Q6H PRN Russella Dar, NP       amiodarone (PACERONE) tablet 200 mg  200 mg Oral Daily Russella Dar, NP   200 mg at 02/17/23 [phone]  apixaban (ELIQUIS) tablet 5 mg  5 mg Oral BID Mosetta Anis, RPH   5 mg at 02/17/23 1610   cefTRIAXone (ROCEPHIN) 1 g in sodium chloride 0.9 % 100 mL IVPB  1 g Intravenous Daily Jerald Kief, MD 200 mL/hr at 02/17/23 0934 1 g at 02/17/23 0934   Chlorhexidine Gluconate Cloth 2 % PADS 6 each  6 each Topical Daily Glade Lloyd, MD   6 each at 02/17/23 0928   citalopram (CELEXA) tablet 20 mg  20 mg Oral Daily Russella Dar, NP   20 mg at 02/17/23 9604   clonazePAM (KLONOPIN) tablet 1 mg  1 mg Oral QHS Russella Dar, NP   1 mg at 02/16/23 2116   diclofenac Sodium (VOLTAREN) 1 % topical gel 2 g  2 g Topical QID Jerald Kief, MD   2 g at 02/17/23 5409   docusate sodium (COLACE) capsule 100 mg  100 mg Oral BID Jerald Kief, MD   100 mg at 02/17/23 8119   ferrous sulfate tablet 325 mg  325 mg Oral Q breakfast Russella Dar, NP   325 mg at 02/17/23 1478   furosemide (LASIX) tablet 40 mg  40 mg Oral Daily  Jerald Kief, MD   40 mg at 02/17/23 2956   gabapentin (NEURONTIN) capsule 100 mg  100 mg Oral QHS Russella Dar, NP   100 mg at 02/16/23 2117   HYDROmorphone (DILAUDID) injection 0.5 mg  0.5 mg Intravenous Q3H PRN Jerald Kief, MD   0.5 mg at 02/16/23 2124   insulin aspart (novoLOG) injection 0-15 Units  0-15 Units Subcutaneous Q4H Gery Pray, MD   2 Units at 02/17/23 1257   insulin glargine-yfgn (SEMGLEE) injection 10 Units  10 Units Subcutaneous Daily Jerald Kief, MD       mometasone-formoterol [facility]) 200-5 MCG/ACT inhaler 2 puff  2 puff Inhalation BID Russella Dar, NP   2 puff at 02/16/23 2028   ondansetron (ZOFRAN) injection 4 mg  4 mg Intravenous Q6H PRN Crosley, Debby, MD       pantoprazole (PROTONIX) EC tablet 40 mg  40 mg Oral BID Russella Dar, NP   40 mg at 02/17/23 0927   polyethylene glycol (MIRALAX / GLYCOLAX) packet 17 g  17 g Oral Daily Glade Lloyd, MD   17 g at 02/17/23 0927   rOPINIRole (REQUIP) tablet 0.25 mg  0.25 mg Oral QHS Russella Dar, NP   0.25 mg at 02/16/23 2117   rosuvastatin (CRESTOR) tablet 40 mg  40 mg Oral Daily Russella Dar, NP   40 mg at 02/17/23 2130   senna (SENOKOT) tablet 17.2 mg  2 tablet Oral QHS Russella Dar, NP   17.2 mg at 02/16/23 2116   senna-docusate (Senokot-S) tablet 1 tablet  1 tablet Oral QHS PRN Glade Lloyd, MD       sodium chloride flush (NS) 0.9 % injection 3 mL  3 mL Intravenous Q12H Russella Dar, NP   3 mL at 02/17/23 0928   sulfamethoxazole-trimethoprim (BACTRIM) 400-80 MG per tablet 1 tablet  1 tablet Oral Daily Jerald Kief, MD   1 tablet at 02/17/23 8657   traZODone (DESYREL) tablet 100 mg  100 mg Oral QHS PRN Jerald Kief, MD   100 mg at 02/16/23 2124   Facility-Administered Medications Ordered in Other Encounters  Medication Dose Route Frequency Provider Last Rate Last Admin   clindamycin (CLEOCIN) 900 mg in  dextrose 5 % 50 mL IVPB  900 mg Intravenous 60 min Pre-Op Almond Lint, MD        And   gentamicin (GARAMYCIN) 5 mg/kg in dextrose 5 % 50 mL IVPB  5 mg/kg Intravenous 60 min Pre-Op Almond Lint, MD          Allergies  Allergen Reactions   Codeine Swelling   Levofloxacin     Other reaction(s): Malaise (intolerance)   Penicillins Swelling    Has patient had a PCN reaction causing immediate rash, facial/tongue/throat swelling, SOB or lightheadedness with hypotension:YES Has patient had a PCN reaction causing severe rash involving mucus membranes or skin necrosis: NO Has patient had a PCN reaction that required hospitalization NO Has patient had a PCN reaction occurring within the last 10 years: NO If all of the above answers are "NO", then may proceed with Cephalosporin use.   Tape Rash  :   Family History  Problem Relation Age of Onset   Heart attack Mother   :   Social History   Socioeconomic History   Marital status: Married    Spouse name: Not on file   Number of children: Not on file   Years of education: Not on file   Highest education level: Not on file  Occupational History   Not on file  Tobacco Use   Smoking status: Former    Current packs/day: 0.00    Types: Cigarettes    Quit date: 08/21/2016    Years since quitting: 6.4   Smokeless tobacco: Never  Vaping Use   Vaping status: Never Used  Substance and Sexual Activity   Alcohol use: No   Drug use: No   Sexual activity: Not on file  Other Topics Concern   Not on file  Social History Narrative   Not on file   Social Determinants of Health   Financial Resource Strain: Not on file  Food Insecurity: No Food Insecurity (02/14/2023)   Hunger Vital Sign    Worried About Running Out of Food in the Last Year: Never true    Ran Out of Food in the Last Year: Never true  Transportation Needs: No Transportation Needs (02/14/2023)   PRAPARE - Administrator, Civil Service (Medical): No    Lack of Transportation (Non-Medical): No  Physical Activity: Not on file  Stress:  Not on file  Social Connections: Not on file  Intimate Partner Violence: Not At Risk (02/14/2023)   Humiliation, Afraid, Rape, and Kick questionnaire    Fear of Current or Ex-Partner: No    Emotionally Abused: No    Physically Abused: No    Sexually Abused: No    Exam: Patient Vitals for the past 24 hrs:  BP Temp Temp src Pulse Resp SpO2  02/17/23 0833 (!) 138/40 97.6 F (36.4 C) Oral (!) 56 19 (!) 84 %  02/17/23 0414 113/73 97.6 F (36.4 C) Oral 60 19 91 %  02/16/23 2055 -- -- -- 61 19 100 %  02/16/23 2029 -- -- -- -- -- 100 %  02/16/23 2028 (!) 157/70 98.2 F (36.8 C) Oral 61 19 100 %  02/16/23 1643 (!) 140/60 98 F (36.7 C) Oral 60 18 93 %    Physical Exam Constitutional:      Appearance: She is well-developed.  HENT:     Head: Normocephalic and atraumatic.  Pulmonary:     Effort: Pulmonary effort is normal.     Breath sounds: Normal breath sounds.  Musculoskeletal:  General: Swelling (Severe bilateral lower extremity swelling) present.  Lymphadenopathy:     Cervical: No cervical adenopathy.  Skin:    General: Skin is warm and dry.  Neurological:     Mental Status: She is alert.       Lab Results  Component Value Date   WBC 5.1 02/17/2023   HGB 9.8 (L) 02/17/2023   HCT 31.0 (L) 02/17/2023   PLT 203 02/17/2023   GLUCOSE 250 (H) 02/17/2023   CHOL 155 02/27/2022   TRIG 142 02/27/2022   HDL 39 (L) 02/27/2022   LDLCALC 88 02/27/2022   ALT 35 02/17/2023   AST 49 (H) 02/17/2023   NA 136 02/17/2023   K 4.6 02/17/2023   CL 104 02/17/2023   CREATININE 1.15 (H) 02/17/2023   BUN 18 02/17/2023   CO2 24 02/17/2023    VAS [place] LOWER EXTREMITY VENOUS (DVT)  Result Date: 02/17/2023  Lower Venous DVT Study Patient Name:  Danielle [name]  Date of Exam:   02/16/2023 Medical Rec #: [id number]             Accession #:    [id number] Date of Birth: 09/23/1933             Patient Gender: F Patient Age:   63 years Exam Location:  [facility] Procedure:       VAS [place] LOWER EXTREMITY VENOUS (DVT) Referring Phys: Jeannett Senior CHIU --------------------------------------------------------------------------------  Indications: Mid right calf pain status post temporary cessation of Eliquis for cytoscopy and stent 02/14/23.  Anticoagulation: Eliquis for Atrial fibrillation. Limitations: Body habitus and calf pain and involuntary movement. Comparison Study: No prior study on file Performing Technologist: Sherren Kerns RVS  Examination Guidelines: A complete evaluation includes B-mode imaging, spectral Doppler, color Doppler, and power Doppler as needed of all accessible portions of each vessel. Bilateral testing is considered an integral part of a complete examination. Limited examinations for reoccurring indications may be performed as noted. The reflux portion of the exam is performed with the patient in reverse Trendelenburg.  +---------+---------------+---------+-----------+----------+-------------------+ RIGHT    CompressibilityPhasicitySpontaneityPropertiesThrombus Aging      +---------+---------------+---------+-----------+----------+-------------------+ CFV      Full           Yes      Yes                                      +---------+---------------+---------+-----------+----------+-------------------+ SFJ      Full                                                             +---------+---------------+---------+-----------+----------+-------------------+ FV Prox  Full                                                             +---------+---------------+---------+-----------+----------+-------------------+ FV Mid   Full                                                             +---------+---------------+---------+-----------+----------+-------------------+  FV DistalFull                                                             +---------+---------------+---------+-----------+----------+-------------------+ PFV      Full                                                              +---------+---------------+---------+-----------+----------+-------------------+ POP      Full           Yes      Yes                                      +---------+---------------+---------+-----------+----------+-------------------+ PTV      None                                         acute mid calf at                                                         site of pain        +---------+---------------+---------+-----------+----------+-------------------+ PERO     None                                         acute mid calf at                                                         site of pain        +---------+---------------+---------+-----------+----------+-------------------+   +----+---------------+---------+-----------+----------+--------------+ LEFTCompressibilityPhasicitySpontaneityPropertiesThrombus Aging +----+---------------+---------+-----------+----------+--------------+ CFV Full           Yes      Yes                                 +----+---------------+---------+-----------+----------+--------------+     Summary: RIGHT: - Findings consistent with acute deep vein thrombosis involving the mid portions of the right posterior tibial veins and right peroneal veins at site of pain.  - No cystic structure found in the popliteal fossa.  LEFT: - No evidence of common femoral vein obstruction.   *See table(s) above for measurements and observations. Electronically signed by Gerarda Fraction on 02/17/2023 at 11:40:15 AM.    Final    DG C-Arm 1-60 Min  Result Date: 02/16/2023 CLINICAL DATA:  Surgery EXAM: Intraoperative fluoroscopy CONTRAST:  Contrast administered. Please see surgical notes for type and dose FLUOROSCOPY: Fluoroscopy Time:  32.3 seconds Radiation Exposure  Index (if provided by the fluoroscopic device): 17.22 mGy Number of Acquired Spot Images: 3 COMPARISON:  CT 02/13/2019 FINDINGS:  Placement of ureteral stent. Dilated collecting system. Imaging was obtained to aid in treatment. IMPRESSION: Intraoperative fluoroscopy Electronically Signed   By: Karen Kays M.D.   On: 02/16/2023 16:55   CT Renal Stone Study  Result Date: 02/13/2023 CLINICAL DATA:  Flank pain and known right proximal ureteral stone EXAM: CT ABDOMEN AND PELVIS WITHOUT CONTRAST TECHNIQUE: Multidetector CT imaging of the abdomen and pelvis was performed following the standard protocol without IV contrast. RADIATION DOSE REDUCTION: This exam was performed according to the departmental dose-optimization program which includes automated exposure control, adjustment of the mA and/or kV according to patient size and/or use of iterative reconstruction technique. COMPARISON:  02/11/2023 FINDINGS: Lower chest: No acute abnormality. Hepatobiliary: No focal liver abnormality is seen. Status post cholecystectomy. No biliary dilatation. Pancreas: Unremarkable. No pancreatic ductal dilatation or surrounding inflammatory changes. Spleen: Normal in size without focal abnormality. Adrenals/Urinary Tract: Adrenal glands show a left adrenal adenoma stable in appearance from the prior exam. This measures up to 18 mm and is been seen on multiple previous studies. Left kidney demonstrates nonobstructing renal calculi measuring up to 8 mm. Left ureter is within normal limits. Right kidney demonstrates multiple renal calculi stable in appearance from the prior exam. Additionally a proximal right ureteral stone is noted measuring 8-9 mm. Mild fullness of the right renal pelvis is noted similar to that seen on the prior exam. The more distal right ureter is unremarkable. The bladder is decompressed. Small left renal cyst is noted stable from the prior study. Stomach/Bowel: Scattered diverticular change of the colon is noted without evidence of diverticulitis. No obstructive changes are seen. The appendix has been surgically removed. Small bowel and  stomach are unremarkable. Vascular/Lymphatic: Aortic atherosclerosis. No enlarged abdominal or pelvic lymph nodes. Reproductive: Status post hysterectomy. No adnexal masses. Other: No abdominal wall hernia or abnormality. No abdominopelvic ascites. Musculoskeletal: No acute or significant osseous findings. IMPRESSION: Bilateral nonobstructing renal calculi stable from the prior exam. Stable 8-9 mm proximal right ureteral stone with mild fullness of the right renal pelvis. Stable left adrenal lesion consistent with adenoma. This is been stable over multiple previous exams and no further follow-up is recommended. Electronically Signed   By: Alcide Clever M.D.   On: 02/13/2023 20:48   DG Chest Portable 1 View  Result Date: 02/13/2023 CLINICAL DATA:  Cough. EXAM: PORTABLE CHEST 1 VIEW COMPARISON:  Chest radiograph dated 01/15/2023 FINDINGS: Background of emphysema. No focal consolidation, pleural effusion, pneumothorax. Mild cardiomegaly. Atherosclerotic calcification of the aorta. No acute osseous pathology. IMPRESSION: 1. No active disease. 2. Emphysema. 3. Mild cardiomegaly. Electronically Signed   By: Elgie Collard M.D.   On: 02/13/2023 20:04    Pathology: NA  VAS [place] LOWER EXTREMITY VENOUS (DVT)  Result Date: 02/17/2023  Lower Venous DVT Study Patient Name:  Danielle [name]  Date of Exam:   02/16/2023 Medical Rec #: [id number]             Accession #:    [id number] Date of Birth: 18-Mar-1934             Patient Gender: F Patient Age:   89 years Exam Location:  [facility] Procedure:      VAS [place] LOWER EXTREMITY VENOUS (DVT) Referring Phys: Jeannett Senior CHIU --------------------------------------------------------------------------------  Indications: Mid right calf pain status post temporary cessation of Eliquis for cytoscopy and stent 02/14/23.  Anticoagulation:  Eliquis for Atrial fibrillation. Limitations: Body habitus and calf pain and involuntary movement. Comparison Study: No prior study on  file Performing Technologist: Sherren Kerns RVS  Examination Guidelines: A complete evaluation includes B-mode imaging, spectral Doppler, color Doppler, and power Doppler as needed of all accessible portions of each vessel. Bilateral testing is considered an integral part of a complete examination. Limited examinations for reoccurring indications may be performed as noted. The reflux portion of the exam is performed with the patient in reverse Trendelenburg.  +---------+---------------+---------+-----------+----------+-------------------+ RIGHT    CompressibilityPhasicitySpontaneityPropertiesThrombus Aging      +---------+---------------+---------+-----------+----------+-------------------+ CFV      Full           Yes      Yes                                      +---------+---------------+---------+-----------+----------+-------------------+ SFJ      Full                                                             +---------+---------------+---------+-----------+----------+-------------------+ FV Prox  Full                                                             +---------+---------------+---------+-----------+----------+-------------------+ FV Mid   Full                                                             +---------+---------------+---------+-----------+----------+-------------------+ FV DistalFull                                                             +---------+---------------+---------+-----------+----------+-------------------+ PFV      Full                                                             +---------+---------------+---------+-----------+----------+-------------------+ POP      Full           Yes      Yes                                      +---------+---------------+---------+-----------+----------+-------------------+ PTV      None  acute mid calf at                                                          site of pain        +---------+---------------+---------+-----------+----------+-------------------+ PERO     None                                         acute mid calf at                                                         site of pain        +---------+---------------+---------+-----------+----------+-------------------+   +----+---------------+---------+-----------+----------+--------------+ LEFTCompressibilityPhasicitySpontaneityPropertiesThrombus Aging +----+---------------+---------+-----------+----------+--------------+ CFV Full           Yes      Yes                                 +----+---------------+---------+-----------+----------+--------------+     Summary: RIGHT: - Findings consistent with acute deep vein thrombosis involving the mid portions of the right posterior tibial veins and right peroneal veins at site of pain.  - No cystic structure found in the popliteal fossa.  LEFT: - No evidence of common femoral vein obstruction.   *See table(s) above for measurements and observations. Electronically signed by Gerarda Fraction on 02/17/2023 at 11:40:15 AM.    Final    DG C-Arm 1-60 Min  Result Date: 02/16/2023 CLINICAL DATA:  Surgery EXAM: Intraoperative fluoroscopy CONTRAST:  Contrast administered. Please see surgical notes for type and dose FLUOROSCOPY: Fluoroscopy Time:  32.3 seconds Radiation Exposure Index (if provided by the fluoroscopic device): 17.22 mGy Number of Acquired Spot Images: 3 COMPARISON:  CT 02/13/2019 FINDINGS: Placement of ureteral stent. Dilated collecting system. Imaging was obtained to aid in treatment. IMPRESSION: Intraoperative fluoroscopy Electronically Signed   By: Karen Kays M.D.   On: 02/16/2023 16:55   CT Renal Stone Study  Result Date: 02/13/2023 CLINICAL DATA:  Flank pain and known right proximal ureteral stone EXAM: CT ABDOMEN AND PELVIS WITHOUT CONTRAST TECHNIQUE: Multidetector CT imaging of  the abdomen and pelvis was performed following the standard protocol without IV contrast. RADIATION DOSE REDUCTION: This exam was performed according to the departmental dose-optimization program which includes automated exposure control, adjustment of the mA and/or kV according to patient size and/or use of iterative reconstruction technique. COMPARISON:  02/11/2023 FINDINGS: Lower chest: No acute abnormality. Hepatobiliary: No focal liver abnormality is seen. Status post cholecystectomy. No biliary dilatation. Pancreas: Unremarkable. No pancreatic ductal dilatation or surrounding inflammatory changes. Spleen: Normal in size without focal abnormality. Adrenals/Urinary Tract: Adrenal glands show a left adrenal adenoma stable in appearance from the prior exam. This measures up to 18 mm and is been seen on multiple previous studies. Left kidney demonstrates nonobstructing renal calculi measuring up to 8 mm. Left ureter is within normal limits. Right kidney demonstrates multiple renal calculi stable in appearance from the prior exam. Additionally a proximal right ureteral stone is noted measuring  8-9 mm. Mild fullness of the right renal pelvis is noted similar to that seen on the prior exam. The more distal right ureter is unremarkable. The bladder is decompressed. Small left renal cyst is noted stable from the prior study. Stomach/Bowel: Scattered diverticular change of the colon is noted without evidence of diverticulitis. No obstructive changes are seen. The appendix has been surgically removed. Small bowel and stomach are unremarkable. Vascular/Lymphatic: Aortic atherosclerosis. No enlarged abdominal or pelvic lymph nodes. Reproductive: Status post hysterectomy. No adnexal masses. Other: No abdominal wall hernia or abnormality. No abdominopelvic ascites. Musculoskeletal: No acute or significant osseous findings. IMPRESSION: Bilateral nonobstructing renal calculi stable from the prior exam. Stable 8-9 mm proximal  right ureteral stone with mild fullness of the right renal pelvis. Stable left adrenal lesion consistent with adenoma. This is been stable over multiple previous exams and no further follow-up is recommended. Electronically Signed   By: Alcide Clever M.D.   On: 02/13/2023 20:48   DG Chest Portable 1 View  Result Date: 02/13/2023 CLINICAL DATA:  Cough. EXAM: PORTABLE CHEST 1 VIEW COMPARISON:  Chest radiograph dated 01/15/2023 FINDINGS: Background of emphysema. No focal consolidation, pleural effusion, pneumothorax. Mild cardiomegaly. Atherosclerotic calcification of the aorta. No acute osseous pathology. IMPRESSION: 1. No active disease. 2. Emphysema. 3. Mild cardiomegaly. Electronically Signed   By: Elgie Collard M.D.   On: 02/13/2023 20:04

## 2023-02-17 NOTE — Progress Notes (Addendum)
PHARMACY - ANTICOAGULATION CONSULT NOTE  Pharmacy Consult:  Xarelto Indication: DVT  Allergies  Allergen Reactions   Codeine Swelling   Levofloxacin     Other reaction(s): Malaise (intolerance)   Penicillins Swelling    Has patient had a PCN reaction causing immediate rash, facial/tongue/throat swelling, SOB or lightheadedness with hypotension:YES Has patient had a PCN reaction causing severe rash involving mucus membranes or skin necrosis: NO Has patient had a PCN reaction that required hospitalization NO Has patient had a PCN reaction occurring within the last 10 years: NO If all of the above answers are "NO", then may proceed with Cephalosporin use.   Tape Rash    Patient Measurements: Height: 5' 3.5" (161.3 cm) Weight: 110.3 kg (243 lb 2.7 oz) IBW/kg (Calculated) : 53.55  Vital Signs: Temp: 97.6 F (36.4 C) (11/19 0833) Temp Source: Oral (11/19 0833) BP: 138/40 (11/19 0833) Pulse Rate: 56 (11/19 0833)  Labs: Recent Labs    02/15/23 0636 02/16/23 0841 02/17/23 1030  HGB 9.2* 9.7* 9.8*  HCT 29.4* 30.7* 31.0*  PLT 201 208 203  CREATININE 1.01* 1.45* 1.15*    Estimated Creatinine Clearance: 41.5 mL/min (A) (by C-G formula based on SCr of 1.15 mg/dL (H)).   Medical History: Past Medical History:  Diagnosis Date   Anginal pain (HCC)    Arthritis    Asthma    At high risk for falls 11/11/2017   Chest pain 11/09/2015   CHF (congestive heart failure) (HCC)    Chronic diastolic congestive heart failure (HCC) 11/11/2015   Chronic insomnia 10/24/2015   Chronic pain of both knees 05/14/2018   Chronic respiratory failure with hypoxia (HCC) 05/05/2017   CKD (chronic kidney disease) stage 3, GFR 30-59 ml/min (HCC) 05/07/2017   COPD (chronic obstructive pulmonary disease) (HCC)    Coronary artery disease    Coronary artery disease involving native coronary artery of native heart without angina pectoris 11/14/2014   Overview:  40% RCA a cardiac catheter in 2014    Current mild episode of major depressive disorder (HCC) 11/14/2014   Depression    DOE (dyspnea on exertion) 09/07/2018   Dysphagia 08/12/2018   Edema 08/12/2018   Elevated blood sugar 11/09/2015   Emphysema lung (HCC)    Flat feet, bilateral 05/30/2020   Gastroesophageal reflux disease without esophagitis 11/11/2017   GERD (gastroesophageal reflux disease)    Headache    History of hiatal hernia    Hyperlipidemia    Hypertension    Insomnia    Internal hemorrhoids 11/26/2018   Iron deficiency anemia 08/12/2018   Malaise and fatigue 08/14/2015   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/26/2018   Mesenteric mass 02/19/2015   Midsternal chest pain 11/11/2015   Mixed hyperlipidemia 11/14/2014   Morbid obesity (HCC) 09/07/2018   OSA (obstructive sleep apnea) 12/16/2019   Osteoporosis    Panlobular emphysema (HCC) 11/09/2015   Pneumonia 2013   PONV (postoperative nausea and vomiting)    Positive D-dimer 09/08/2018   Primary osteoarthritis involving multiple joints 08/14/2015   Restless legs syndrome 03/04/2018   Senile purpura (HCC) 08/12/2018   Shortness of breath dyspnea    with exertion   Stasis edema of left lower extremity 04/13/2018   Strain of right shoulder 08/12/2018   Type 2 diabetes mellitus with stage 3 chronic kidney disease (HCC) 08/14/2015   Unstable angina (HCC) 10/30/2018   Vitamin B12 deficiency 04/13/2018   Vitamin D deficiency 02/27/2016     Assessment: 61 YOF with history of Afib on Eliquis PTA.  Patient  has an acute DVT and Pharmacy consulted to transition patient to Xarelto.  Renal function improving; CBC stable; no bleeding.  Goal of Therapy:  Appropriate anticoagulation Monitor platelets by anticoagulation protocol: Yes   Plan:  D/C Eliquis Xarelto 15mg  PO BIDwm x 21 days, then 20mg  PO daily with supper unless Heme does not think patient needs a load Pharmacy will sign off and follow peripherally.  Thank you for the consult!  Thuy D. Laney Potash, PharmD, BCPS,  BCCCP 02/17/2023, 12:34 PM   17:30 pm - no need to load Xarelto per hematology. Thank you. Okey Regal, PharmD

## 2023-02-17 NOTE — Plan of Care (Signed)
  Problem: Education: Goal: Ability to describe self-care measures that may prevent or decrease complications (Diabetes Survival Skills Education) will improve Outcome: Progressing   

## 2023-02-17 NOTE — Progress Notes (Signed)
   02/17/23 2019  BiPAP/CPAP/SIPAP  Reason BIPAP/CPAP not in use Non-compliant  BiPAP/CPAP /SiPAP Vitals  Pulse Rate 61  Resp 18  SpO2 94 %  Bilateral Breath Sounds Diminished  MEWS Score/Color  MEWS Score 0  MEWS Score Color Danielle [name]

## 2023-02-17 NOTE — Plan of Care (Signed)
  Problem: Education: Goal: Ability to describe self-care measures that may prevent or decrease complications (Diabetes Survival Skills Education) will improve 02/17/2023 1820 by Genevie Ann, RN Outcome: Progressing 02/17/2023 1606 by Genevie Ann, RN Outcome: Progressing

## 2023-02-17 NOTE — Progress Notes (Signed)
PT Cancellation Note  Patient Details Name: Danielle [name] MRN: [id number] DOB: 04/24/1933   Cancelled Treatment:    Reason Eval/Treat Not Completed: (P) Patient not medically ready (Therapy held per request of Dr. Rhona Leavens for further anticoagulation assessment for DVT. Will follow up as pt is medically appropriate.)   Johny Shock 02/17/2023, 10:46 AM

## 2023-02-17 NOTE — Plan of Care (Signed)
  Problem: Education: Goal: Ability to describe self-care measures that may prevent or decrease complications (Diabetes Survival Skills Education) will improve Outcome: Progressing   Problem: Education: Goal: Ability to describe self-care measures that may prevent or decrease complications (Diabetes Survival Skills Education) will improve Outcome: Progressing   Problem: Coping: Goal: Ability to adjust to condition or change in health will improve Outcome: Progressing   Problem: Skin Integrity: Goal: Risk for impaired skin integrity will decrease Outcome: Progressing   Problem: Education: Goal: Individualized Educational Video(s) Outcome: Not Applicable

## 2023-02-17 NOTE — Progress Notes (Signed)
  Progress Note   Patient: Danielle [name] ZOX:[id number] DOB: [birth year] DOA: 02/13/2023     3 DOS: the patient was seen and examined on 02/17/2023   Brief hospital course: 89yo with hx OA, chronic diastolic CHF, CKD3, CAD, COPD, HTN, HLD, afib on anticoagulation who presented severe back pain and dysuria, found to have renal stone and UTI.   Assessment and Plan: Symptomatic right renal calculus with fullness right renal pelvis -now s/p cystoscopy and OK to anticoagulate per Urology -OK to d/c home per Urology once medically clear -some hematuria noted. Hgb remains stable  UTI -Urine cx neg -would treat 5 days of abx, then regular strength bactrim daily or suppression per Urology recs   Chronic atrial fibrillation on Eliquis -Currently rate controlled -Currently continued on eliquis, see below   Chronic diastolic heart failure -continue PO lasix as tolerated -follow I/o and daily wts   COPD on home oxygen Continue supportive care with oxygen and home MDI/nebulizer -Pt baseline on 2LNC at night and PRN during the day. Suspect pt may need more chronic O2 given her COPD and CHF   Diabetes mellitus 2 Cont to hold Amaryl and Januvia while in hospital Currently on SSI as needed Glucose is suboptimally controlled. Semglee 10 units ordered   Dyslipidemia Continue Crestor   History of depression Continue Celexa and clonazepam at bedtime for chronic insomnia  Acute RLE DVT -acute RLE DVT noted on LE dopplers -Already on eliquis. Will discuss with Hematology regarding any anticoagulation changes if needed and if this can be considered eliquis failure or not   Subjective: States RLE pain seems improved today  Physical Exam: Vitals:   02/16/23 2055 02/17/23 0414 02/17/23 0833 02/17/23 1521  BP:  113/73 (!) 138/40 (!) 142/57  Pulse: 61 60 (!) 56 (!) 55  Resp: 19 19 19 18   Temp:  97.6 F (36.4 C) 97.6 F (36.4 C)   TempSrc:  Oral Oral Oral  SpO2: 100% 91% (!) 84% 100%   Weight:      Height:       General exam: Awake, laying in bed, in nad Respiratory system: Normal respiratory effort, no wheezing Cardiovascular system: regular rate, s1, s2 Gastrointestinal system: Soft, nondistended, positive BS Central nervous system: CN2-12 grossly intact, strength intact Extremities: Perfused, no clubbing Skin: Normal skin turgor, no notable skin lesions seen Psychiatry: Mood normal // no visual hallucinations   Data Reviewed:  Labs reviewed: Na 136, K 4.6, Cr 1.15, WBC 5.1, Hgb 9.8  Family Communication: Pt in room, family at bedside  Disposition: Status is: Inpatient Remains inpatient appropriate because: severity of illness  Planned Discharge Destination: Home    Author: Rickey Barbara, MD 02/17/2023 5:03 PM  For on call review [email or web address].

## 2023-02-18 DIAGNOSIS — N200 Calculus of kidney: Secondary | ICD-10-CM | POA: Diagnosis not present

## 2023-02-18 LAB — COMPREHENSIVE METABOLIC PANEL
ALT: 35 U/L (ref 0–44)
AST: 32 U/L (ref 15–41)
Albumin: 2.7 g/dL — ABNORMAL LOW (ref 3.5–5.0)
Alkaline Phosphatase: 50 U/L (ref 38–126)
Anion gap: 6 (ref 5–15)
BUN: 18 mg/dL (ref 8–23)
CO2: 29 mmol/L (ref 22–32)
Calcium: 8.7 mg/dL — ABNORMAL LOW (ref 8.9–10.3)
Chloride: 103 mmol/L (ref 98–111)
Creatinine, Ser: 1.01 mg/dL — ABNORMAL HIGH (ref 0.44–1.00)
GFR, Estimated: 54 mL/min — ABNORMAL LOW (ref >60)
Glucose, Bld: 164 mg/dL — ABNORMAL HIGH (ref 70–99)
Potassium: 4.5 mmol/L (ref 3.5–5.1)
Sodium: 138 mmol/L (ref 135–145)
Total Bilirubin: 0.2 mg/dL (ref <1.2)
Total Protein: 5.5 g/dL — ABNORMAL LOW (ref 6.5–8.1)

## 2023-02-18 LAB — GLUCOSE, CAPILLARY
Glucose-Capillary: 159 mg/dL — ABNORMAL HIGH (ref 70–99)
Glucose-Capillary: 204 mg/dL — ABNORMAL HIGH (ref 70–99)
Glucose-Capillary: 205 mg/dL — ABNORMAL HIGH (ref 70–99)
Glucose-Capillary: 207 mg/dL — ABNORMAL HIGH (ref 70–99)
Glucose-Capillary: 208 mg/dL — ABNORMAL HIGH (ref 70–99)
Glucose-Capillary: 303 mg/dL — ABNORMAL HIGH (ref 70–99)

## 2023-02-18 LAB — CBC
HCT: 28.3 % — ABNORMAL LOW (ref 36.0–46.0)
Hemoglobin: 9 g/dL — ABNORMAL LOW (ref 12.0–15.0)
MCH: 31.3 pg (ref 26.0–34.0)
MCHC: 31.8 g/dL (ref 30.0–36.0)
MCV: 98.3 fL (ref 80.0–100.0)
Platelets: 257 10*3/uL (ref 150–400)
RBC: 2.88 MIL/uL — ABNORMAL LOW (ref 3.87–5.11)
RDW: 12.8 % (ref 11.5–15.5)
WBC: 5.2 10*3/uL (ref 4.0–10.5)
nRBC: 0 % (ref 0.0–0.2)

## 2023-02-18 LAB — CULTURE, BLOOD (ROUTINE X 2): Culture: NO GROWTH

## 2023-02-18 MED ORDER — NYSTATIN 100000 UNIT/GM EX CREA
TOPICAL_CREAM | Freq: Two times a day (BID) | CUTANEOUS | Status: DC
  Filled 2023-02-18: qty 30

## 2023-02-18 MED ORDER — SPIRONOLACTONE 12.5 MG HALF TABLET
12.5000 mg | ORAL_TABLET | Freq: Every day | ORAL | Status: DC
  Administered 2023-02-18 – 2023-02-19 (×2): 12.5 mg via ORAL
  Filled 2023-02-18 (×2): qty 1

## 2023-02-18 NOTE — Inpatient Diabetes Management (Signed)
Inpatient Diabetes Program Recommendations  AACE/ADA: New Consensus Statement on Inpatient Glycemic Control (2015)  Target Ranges:  Prepandial:   less than 140 mg/dL      Peak postprandial:   less than 180 mg/dL (1-2 hours)      Critically ill patients:  140 - 180 mg/dL   Lab Results  Component Value Date   GLUCAP 204 (H) 02/18/2023   HGBA1C 7.0 (H) 02/14/2023    Review of Glycemic Control  Latest Reference Range & Units 02/17/23 23:19 02/18/23 00:25 02/18/23 03:46 02/18/23 07:39 02/18/23 12:03  Glucose-Capillary 70 - 99 mg/dL 161 (H) 096 (H) 045 (H) 159 (H) 204 (H)   Diabetes history: DM 2 Outpatient Diabetes medications:  Amaryl 4 mg daily Metformin 500 mg daily Januvia 100 mg daily Current orders for Inpatient glycemic control:  Novolog 0-15 units q 4 hours Semglee 10 units daily  Inpatient Diabetes Program Recommendations:    If appropriate, consider increasing Semglee to 15 units daily.  Based on A1C, patient does not appear to need insulin outpatient and should be able to resume oral DM medications at discharge.   Thanks,  Beryl Meager, RN, BC-ADM Inpatient Diabetes Coordinator Pager [phone]  (8a-5p)

## 2023-02-18 NOTE — Progress Notes (Signed)
Occupational Therapy Treatment Patient Details Name: Danielle [name] MRN: [id number] DOB: Nov 28, 1933 Today's Date: 02/18/2023   History of present illness 89 y.o. female [facility] to [facility]13[facility]23 with severe back pain, dysuria, and difficulty urinating. Pt with B nonobstructing renal calculi, proximal R uretal stone, and UTI. S/p cystoscopy, R retrograde w/ interpretation and placement of R DJ stent.  R LE DVT 11/18. PMHx: COPD, chronic respiratory failure with hypoxia on home oxygen, coronary artery disease, morbid obesity, diabetes mellitus type 2, chronic kidney disease stage IIIa, hyperlipidemia.   OT comments  Patient in recliner, asleep but awakens easily. Completing transfers and mobility with RW given supervision, intermittent cueing for safety with RW.  Supervision for grooming at sink. She removes O2 for mobility but noted increased WOB after mobility in room, pt reports this is baseline but would benefit from O2 check next session (PT notified). Will follow acutely, continue to recommend HHOT services.        If plan is discharge home, recommend the following:  A little help with walking and/or transfers;A little help with bathing/dressing/bathroom;Assistance with cooking/housework;Assist for transportation;Help with stairs or ramp for entrance   Equipment Recommendations       Recommendations for Other Services      Precautions / Restrictions Precautions Precautions: Fall Precaution Comments: R back stent Restrictions Weight Bearing Restrictions: No       Mobility Bed Mobility               General bed mobility comments: OOB in recliner    Transfers Overall transfer level: Needs assistance Equipment used: Rolling walker (2 wheels) Transfers: Sit to/from Stand Sit to Stand: Supervision                 Balance Overall balance assessment: Needs assistance Sitting-balance support: No upper extremity supported, Feet supported Sitting balance-Leahy  Scale: Fair     Standing balance support: Bilateral upper extremity supported, During functional activity, No upper extremity supported Standing balance-Leahy Scale: Fair Standing balance comment: relies on RW dynamically but able to engage in ADLs with 0-1 hand support with min gaurd                           ADL either performed or assessed with clinical judgement   ADL Overall ADL's : Needs assistance/impaired     Grooming: Set up;Sitting           Upper Body Dressing : Set up;Sitting       Toilet Transfer: Supervision/safety;Ambulation;Rolling walker (2 wheels)   Toileting- Clothing Manipulation and Hygiene: Supervision/safety;Sit to/from stand       Functional mobility during ADLs: Supervision/safety;Rolling walker (2 wheels)      Extremity/Trunk Assessment              Vision       Perception     Praxis      Cognition Arousal: Alert Behavior During Therapy: WFL for tasks assessed/performed Overall Cognitive Status: Within Functional Limits for tasks assessed                                          Exercises      Shoulder Instructions       General Comments pt removes O2 for mobility but noted increased SOB after returning back to sit (pt reports this is normal for her) would recommend SPo2  check during mobility    Pertinent Vitals/ Pain       Pain Assessment Pain Assessment: Faces Faces Pain Scale: Hurts a little bit Pain Location: generalized, headache Pain Descriptors / Indicators: Aching, Discomfort Pain Intervention(s): Limited activity within patient's tolerance, Monitored during session, Repositioned  Home Living                                          Prior Functioning/Environment              Frequency  Min 1X/week        Progress Toward Goals  OT Goals(current goals can now be found in the care plan section)  Progress towards OT goals: Progressing toward  goals  Acute Rehab OT Goals Patient Stated Goal: home OT Goal Formulation: With patient Time For Goal Achievement: 03/02/23 Potential to Achieve Goals: Good  Plan      Co-evaluation                 AM-PAC OT "6 Clicks" Daily Activity     Outcome Measure   Help from another person eating meals?: None Help from another person taking care of personal grooming?: A Little Help from another person toileting, which includes using toliet, bedpan, or urinal?: A Little Help from another person bathing (including washing, rinsing, drying)?: A Little Help from another person to put on and taking off regular upper body clothing?: A Little Help from another person to put on and taking off regular lower body clothing?: A Little 6 Click Score: 19    End of Session Equipment Utilized During Treatment: Rolling walker (2 wheels)  OT Visit Diagnosis: Other abnormalities of gait and mobility (R26.89);Muscle weakness (generalized) (M62.81);History of falling (Z91.81)   Activity Tolerance Patient tolerated treatment well   Patient Left in chair;with call bell/phone within reach;with family/visitor present;with nursing/sitter in room   Nurse Communication Mobility status        Time: 1244-1301 OT Time Calculation (min): 17 min  Charges: OT General Charges $OT Visit: 1 Visit OT Treatments $Self Care/Home Management : 8-22 mins  Barry Brunner, OT Acute Rehabilitation Services Office [phone]   Danielle [name] 02/18/2023, 2:02 PM

## 2023-02-18 NOTE — Discharge Instructions (Addendum)
Information on my medicine - XARELTO (rivaroxaban)  This medication education was reviewed with me or my healthcare representative as part of my discharge preparation.  The pharmacist that spoke with me during my hospital stay was:  Phillips Climes, PharmD, BCCPS  WHY WAS Danielle [name] PRESCRIBED FOR YOU? Xarelto was prescribed to treat blood clots that may have been found in the veins of your legs (deep vein thrombosis) or in your lungs (pulmonary embolism) and to reduce the risk of them occurring again.  What do you need to know about Xarelto? Your dose is changed to 20 mg tablet taken ONCE A DAY with your evening meal.  DO NOT stop taking Xarelto without talking to the health care provider who prescribed the medication.  Refill your prescription for 20 mg tablets before you run out.  After discharge, you should have regular check-up appointments with your healthcare provider that is prescribing your Xarelto.  In the future your dose may need to be changed if your kidney function changes by a significant amount.  What do you do if you miss a dose? If you are taking Xarelto TWICE DAILY and you miss a dose, take it as soon as you remember. You may take two 15 mg tablets (total 30 mg) at the same time then resume your regularly scheduled 15 mg twice daily the next day.  If you are taking Xarelto ONCE DAILY and you miss a dose, take it as soon as you remember on the same day then continue your regularly scheduled once daily regimen the next day. Do not take two doses of Xarelto at the same time.   Important Safety Information Xarelto is a blood thinner medicine that can cause bleeding. You should call your healthcare provider right away if you experience any of the following: Bleeding from an injury or your nose that does not stop. Unusual colored urine (red or dark brown) or unusual colored stools (red or black). Unusual bruising for unknown reasons. A serious fall or if you hit your head (even  if there is no bleeding).  Some medicines may interact with Xarelto and might increase your risk of bleeding while on Xarelto. To help avoid this, consult your healthcare provider or pharmacist prior to using any new prescription or non-prescription medications, including herbals, vitamins, non-steroidal anti-inflammatory drugs (NSAIDs) and supplements.  This website has more information on Xarelto: VisitDestination.com.br.

## 2023-02-18 NOTE — Progress Notes (Signed)
   02/18/23 0434  Provider Notification  Date Provider Notified 02/19/23  Time Provider Notified [phone]  Method of Notification Page (secure chat)  Notification Reason Other (Comment) (redness to groin with odor,need medication,on-call informed)  Provider response See new orders  Date of Provider Response 02/18/23  Time of Provider Response [phone]

## 2023-02-18 NOTE — Progress Notes (Signed)
Progress Note   Patient: Danielle [name] OZH:[id number] DOB: 07-Sep-1933 DOA: 02/13/2023     4 DOS: the patient was seen and examined on 02/18/2023   Brief hospital course: 89yo with h/o chronic diastolic CHF, stage 3a CKD, CAD, COPD, HTN, HLD, and afib on anticoagulation who presented on 11/15 with severe back pain and dysuria, found to have renal stone and UTI.  She was also found to have an acute RLE DVT while on Eliquis, changed to Xarelto.  Assessment and Plan:  Symptomatic right renal calculus with fullness right renal pelvis s/p cystoscopy and stent placement OK to d/c home per Urology once medically clear Ongoing hematuria noted. Hgb remains stable Will monitor for one additional day since [facility] was restarted   UTI Urine cx negative Would treat 5 days of abx, then regular strength bactrim daily or suppression per Urology recs   Chronic atrial fibrillation on Eliquis Currently rate controlled with amiodarone Developed DVT on Eliquis - changed to Xarelto Xarelto started on 11/19, will continue to monitor given ongoing hematuria to ensure Hgb stability   Chronic diastolic heart failure Continue PO lasix as tolerated Resume spironolactone   COPD on home oxygen Continue supportive care with oxygen and home MDI/nebulizer Pt baseline on 2LNC at night and PRN during the day. Suspect pt may need more chronic O2 given her COPD and CHF Continue Symbicort (Dulera per formulary)   Diabetes mellitus 2 Continue to hold glimepride, metformin, and Januvia while in hospital Currently on SSI as needed Semglee 10 units ordered Continue gabapentin   Dyslipidemia Continue Crestor   History of depression Continue Celexa and clonazepam at bedtime for chronic insomnia   Acute RLE DVT Acute RLE DVT noted on LE dopplers Hematology consulted given possible Eliquis failure (not definite) Changed to Xarelto If this fails, she will need Lovenox or warfarin long-term  Morbid  obesity Body mass index is 43.9 kg/m.Marland Kitchen  Weight loss should be encouraged Outpatient PCP/bariatric medicine f/u encouraged     Consultants: Urology Hematology PT OT  Procedures: Cystoscopy, stent placement 11/16  Antibiotics: Ceftriaxone x 5 days  30 Day Unplanned Readmission Risk Score    Flowsheet Row ED to Hosp-Admission (Current) from 02/13/2023 in [facility]  SURGICAL  30 Day Unplanned Readmission Risk Score (%) 21.28 Filed at 02/18/2023 0801       This score is the patient's risk of an unplanned readmission within 30 days of being discharged (0 -100%). The score is based on dignosis, age, lab data, medications, orders, and past utilization.   Low:  0-14.9   Medium: 15-21.9   High: 22-29.9   Extreme: 30 and above           Subjective: Concerned about 4 drops of bright red blood on her gown as well as ongoing (unchanged) hematuria.  Encouraged to move with PT/OT today.   Objective: Vitals:   02/18/23 0803 02/18/23 1627  BP:  138/61  Pulse: 60 (!) 59  Resp: 16 16  Temp:  97.8 F (36.6 C)  SpO2:  100%    Intake/Output Summary (Last 24 hours) at 02/18/2023 1738 Last data filed at 02/18/2023 1048 Gross per 24 hour  Intake 480 ml  Output --  Net 480 ml   Filed Weights   02/14/23 1037 02/16/23 0332 02/18/23 0500  Weight: 109.7 kg 110.3 kg 114.2 kg    Exam:  General:  Appears calm and comfortable and is in NAD, on home O2 Eyes:   EOMI, normal  lids, iris ENT:  grossly normal hearing, lips & tongue, mmm Neck:  no LAD, masses or thyromegaly Cardiovascular:  RRR, no m/r/g. No LE edema.  Respiratory:   CTA bilaterally with no wheezes/rales/rhonchi.  Normal respiratory effort. Abdomen:  soft, NT, ND Skin:  no rash or induration seen on limited exam Musculoskeletal:  grossly normal tone BUE/BLE, good ROM, no bony abnormality Psychiatric:  grossly normal mood and affect, speech fluent and appropriate, AOx3 Neurologic:  CN 2-12  grossly intact, moves all extremities in coordinated fashion  Data Reviewed: I have reviewed the patient's lab results since admission.  Pertinent labs for today include:   BUN 18/Creatinine 1.01/GFR 54 Albumin 2.4 Glucose 204-303 WBC 5.2 Hgb 9, down from 10.9 on 11/15, 9.8, on 11/19     Family Communication: Daughter was present throughout evaluation  Disposition: Status is: Inpatient Remains inpatient appropriate because: ongoing monitoring     Time spent: 50 minutes  Unresulted Labs (From admission, onward)     Start     Ordered   02/19/23 0500  CBC with Differential/Platelet  Tomorrow morning,   R        02/18/23 1738   02/19/23 0500  Basic metabolic panel  Tomorrow morning,   R        02/18/23 1738             Author: Jonah Blue, MD 02/18/2023 5:38 PM  For on call review [email or web address].

## 2023-02-18 NOTE — Plan of Care (Signed)
  Problem: Coping: Goal: Ability to adjust to condition or change in health will improve Outcome: Progressing   Problem: Fluid Volume: Goal: Ability to maintain a balanced intake and output will improve Outcome: Progressing   Problem: Metabolic: Goal: Ability to maintain appropriate glucose levels will improve Outcome: Progressing   Problem: Nutritional: Goal: Maintenance of adequate nutrition will improve Outcome: Progressing   Problem: Nutritional: Goal: Progress toward achieving an optimal weight will improve Outcome: Progressing   Problem: Clinical Measurements: Goal: Will remain free from infection Outcome: Progressing   Problem: Clinical Measurements: Goal: Respiratory complications will improve Outcome: Progressing   Problem: Nutrition: Goal: Adequate nutrition will be maintained Outcome: Progressing   Problem: Coping: Goal: Level of anxiety will decrease Outcome: Progressing

## 2023-02-18 NOTE — Progress Notes (Signed)
Physical Therapy Treatment Patient Details Name: Danielle [name] MRN: [id number] DOB: 15-Dec-1933 Today's Date: 02/18/2023   History of Present Illness 89 y.o. female presents to [facility] 02/13/23 with severe back pain, dysuria, and difficulty urinating. Pt with B nonobstructing renal calculi, proximal R uretal stone, and UTI. S/p cystoscopy, R retrograde w/ interpretation and placement of R DJ stent.  R LE DVT 11/18. PMHx: COPD, chronic respiratory failure with hypoxia on home oxygen, coronary artery disease, morbid obesity, diabetes mellitus type 2, chronic kidney disease stage IIIa, hyperlipidemia.    PT Comments  Pt in bed upon arrival and agreeable to PT session. Worked on LE strength and gait training in today's session. Pt has increased edema in B LE compared to initial eval and reports that her legs "feel heavy". Pt initially did not want to ambulate, however, pt wanted to go to the bathroom after performing LE exercises. Pt continues to need supervision for safety with mobility. Pt in bathroom with nurse present at end of session. Pt is progressing towards goals. Acute PT to follow.      If plan is discharge home, recommend the following: A little help with walking and/or transfers;A little help with bathing/dressing/bathroom;Help with stairs or ramp for entrance;Assist for transportation   Can travel by private vehicle      Yes  Equipment Recommendations  None recommended by PT       Precautions / Restrictions Precautions Precautions: Fall Precaution Comments: R back stent Restrictions Weight Bearing Restrictions: No     Mobility  Bed Mobility  General bed mobility comments: in recliner upon arrival    Transfers Overall transfer level: Needs assistance Equipment used: Rolling walker (2 wheels) Transfers: Sit to/from Stand Sit to Stand: Supervision    General transfer comment: supervision for safety    Ambulation/Gait Ambulation/Gait assistance: Supervision Gait  Distance (Feet): 20 Feet Assistive device: Rolling walker (2 wheels) Gait Pattern/deviations: Step-through pattern Gait velocity: dec     General Gait Details: slow and steady gait          Balance Overall balance assessment: Needs assistance Sitting-balance support: No upper extremity supported, Feet supported Sitting balance-Leahy Scale: Fair     Standing balance support: Bilateral upper extremity supported, During functional activity, No upper extremity supported Standing balance-Leahy Scale: Poor Standing balance comment: reliant on RW       Cognition Arousal: Alert Behavior During Therapy: WFL for tasks assessed/performed Overall Cognitive Status: Within Functional Limits for tasks assessed       Exercises General Exercises - Lower Extremity Ankle Circles/Pumps: AROM, Both, 15 reps, Supine Long Arc Quad: AROM, Both, 10 reps, Seated Straight Leg Raises: AROM, Both, Supine, 5 reps (limited ROM) Hip Flexion/Marching: Both, AROM, 10 reps, Seated    General Comments General comments (skin integrity, edema, etc.): SOB after performing exercises and walking a short distance, pt on RA, unable to get reading on SpO2 monitor      Pertinent Vitals/Pain Pain Assessment Pain Assessment: Faces Faces Pain Scale: Hurts little more Pain Location: L posterior head Pain Descriptors / Indicators: Aching, Discomfort Pain Intervention(s): Limited activity within patient's tolerance, Monitored during session, Repositioned     PT Goals (current goals can now be found in the care plan section) Acute Rehab PT Goals PT Goal Formulation: With patient/family Time For Goal Achievement: 03/01/23 Potential to Achieve Goals: Good Progress towards PT goals: Progressing toward goals    Frequency    Min 1X/week           AM-PAC  PT "6 Clicks" Mobility   Outcome Measure  Help needed turning from your back to your side while in a flat bed without using bedrails?: A Little Help  needed moving from lying on your back to sitting on the side of a flat bed without using bedrails?: A Little Help needed moving to and from a bed to a chair (including a wheelchair)?: A Little Help needed standing up from a chair using your arms (e.g., wheelchair or bedside chair)?: A Little Help needed to walk in hospital room?: A Little Help needed climbing 3-5 steps with a railing? : A Lot 6 Click Score: 17    End of Session Equipment Utilized During Treatment: Gait belt Activity Tolerance: Patient tolerated treatment well Patient left: with nursing/sitter in room (on commode w/ nurse present) Nurse Communication: Mobility status PT Visit Diagnosis: Unsteadiness on feet (R26.81);Muscle weakness (generalized) (M62.81);History of falling (Z91.81)     Time: 2952-8413 PT Time Calculation (min) (ACUTE ONLY): 27 min  Charges:    $Gait Training: 8-22 mins $Therapeutic Exercise: 8-22 mins PT General Charges $$ ACUTE PT VISIT: 1 Visit                     Hilton Cork, PT, DPT Secure Chat Preferred  Rehab Office [phone]   Arturo Morton Brion Aliment 02/18/2023, 5:32 PM

## 2023-02-19 ENCOUNTER — Other Ambulatory Visit (HOSPITAL_COMMUNITY): Payer: Self-pay

## 2023-02-19 DIAGNOSIS — N200 Calculus of kidney: Secondary | ICD-10-CM | POA: Diagnosis not present

## 2023-02-19 DIAGNOSIS — I82401 Acute embolism and thrombosis of unspecified deep veins of right lower extremity: Secondary | ICD-10-CM | POA: Diagnosis present

## 2023-02-19 LAB — BASIC METABOLIC PANEL
Anion gap: 9 (ref 5–15)
BUN: 18 mg/dL (ref 8–23)
CO2: 26 mmol/L (ref 22–32)
Calcium: 8.6 mg/dL — ABNORMAL LOW (ref 8.9–10.3)
Chloride: 99 mmol/L (ref 98–111)
Creatinine, Ser: 1.21 mg/dL — ABNORMAL HIGH (ref 0.44–1.00)
GFR, Estimated: 43 mL/min — ABNORMAL LOW (ref >60)
Glucose, Bld: 172 mg/dL — ABNORMAL HIGH (ref 70–99)
Potassium: 4.3 mmol/L (ref 3.5–5.1)
Sodium: 134 mmol/L — ABNORMAL LOW (ref 135–145)

## 2023-02-19 LAB — CULTURE, BLOOD (ROUTINE X 2)
Culture: NO GROWTH
Special Requests: ADEQUATE

## 2023-02-19 LAB — CBC WITH DIFFERENTIAL/PLATELET
Abs Immature Granulocytes: 0.04 10*3/uL (ref 0.00–0.07)
Basophils Absolute: 0.1 10*3/uL (ref 0.0–0.1)
Basophils Relative: 1 %
Eosinophils Absolute: 0.4 10*3/uL (ref 0.0–0.5)
Eosinophils Relative: 6 %
HCT: 29.6 % — ABNORMAL LOW (ref 36.0–46.0)
Hemoglobin: 9.4 g/dL — ABNORMAL LOW (ref 12.0–15.0)
Immature Granulocytes: 1 %
Lymphocytes Relative: 27 %
Lymphs Abs: 1.6 10*3/uL (ref 0.7–4.0)
MCH: 31.2 pg (ref 26.0–34.0)
MCHC: 31.8 g/dL (ref 30.0–36.0)
MCV: 98.3 fL (ref 80.0–100.0)
Monocytes Absolute: 0.6 10*3/uL (ref 0.1–1.0)
Monocytes Relative: 10 %
Neutro Abs: 3.3 10*3/uL (ref 1.7–7.7)
Neutrophils Relative %: 55 %
Platelets: 257 10*3/uL (ref 150–400)
RBC: 3.01 MIL/uL — ABNORMAL LOW (ref 3.87–5.11)
RDW: 13 % (ref 11.5–15.5)
WBC: 6 10*3/uL (ref 4.0–10.5)
nRBC: 0 % (ref 0.0–0.2)

## 2023-02-19 LAB — GLUCOSE, CAPILLARY
Glucose-Capillary: 144 mg/dL — ABNORMAL HIGH (ref 70–99)
Glucose-Capillary: 148 mg/dL — ABNORMAL HIGH (ref 70–99)
Glucose-Capillary: 155 mg/dL — ABNORMAL HIGH (ref 70–99)
Glucose-Capillary: 235 mg/dL — ABNORMAL HIGH (ref 70–99)

## 2023-02-19 MED ORDER — FUROSEMIDE 40 MG PO TABS
40.0000 mg | ORAL_TABLET | Freq: Every day | ORAL | 1 refills | Status: DC
  Filled 2023-02-19: qty 30, 30d supply, fill #0

## 2023-02-19 MED ORDER — RIVAROXABAN 20 MG PO TABS
20.0000 mg | ORAL_TABLET | Freq: Every day | ORAL | 1 refills | Status: DC
  Filled 2023-02-19: qty 30, 30d supply, fill #0

## 2023-02-19 MED ORDER — SULFAMETHOXAZOLE-TRIMETHOPRIM 400-80 MG PO TABS
1.0000 | ORAL_TABLET | Freq: Every day | ORAL | 1 refills | Status: DC
  Filled 2023-02-19: qty 30, 30d supply, fill #0

## 2023-02-19 MED ORDER — SPIRONOLACTONE 25 MG PO TABS
12.5000 mg | ORAL_TABLET | Freq: Every day | ORAL | 1 refills | Status: DC
  Filled 2023-02-19: qty 30, 60d supply, fill #0

## 2023-02-19 NOTE — Progress Notes (Signed)
Explained discharge instructions to patient. Reviewed follow up appointment and next medication administration times. Also reviewed education. Patient verbalized having an understanding for instructions given. All belongings are in the patient's possession to include TOC meds. IV removed.  No other needs verbalized. =Will transport downstairs for discharge.

## 2023-02-19 NOTE — Plan of Care (Signed)
  Problem: Fluid Volume: Goal: Ability to maintain a balanced intake and output will improve Outcome: Progressing   Problem: Metabolic: Goal: Ability to maintain appropriate glucose levels will improve Outcome: Progressing   Problem: Nutritional: Goal: Maintenance of adequate nutrition will improve Outcome: Progressing   Problem: Nutritional: Goal: Progress toward achieving an optimal weight will improve Outcome: Progressing   Problem: Skin Integrity: Goal: Risk for impaired skin integrity will decrease Outcome: Progressing   Problem: Clinical Measurements: Goal: Will remain free from infection Outcome: Progressing   Problem: Clinical Measurements: Goal: Respiratory complications will improve Outcome: Progressing   Problem: Activity: Goal: Risk for activity intolerance will decrease Outcome: Progressing   Problem: Coping: Goal: Level of anxiety will decrease Outcome: Progressing   Problem: Pain Management: Goal: General experience of comfort will improve Outcome: Progressing

## 2023-02-19 NOTE — Plan of Care (Signed)
  Problem: Education: Goal: Ability to describe self-care measures that may prevent or decrease complications (Diabetes Survival Skills Education) will improve Outcome: Adequate for Discharge   Problem: Coping: Goal: Ability to adjust to condition or change in health will improve Outcome: Adequate for Discharge   Problem: Fluid Volume: Goal: Ability to maintain a balanced intake and output will improve Outcome: Adequate for Discharge   Problem: Health Behavior/Discharge Planning: Goal: Ability to identify and utilize available resources and services will improve Outcome: Adequate for Discharge Goal: Ability to manage health-related needs will improve Outcome: Adequate for Discharge   Problem: Metabolic: Goal: Ability to maintain appropriate glucose levels will improve Outcome: Adequate for Discharge   Problem: Nutritional: Goal: Maintenance of adequate nutrition will improve Outcome: Adequate for Discharge Goal: Progress toward achieving an optimal weight will improve Outcome: Adequate for Discharge   Problem: Skin Integrity: Goal: Risk for impaired skin integrity will decrease Outcome: Adequate for Discharge   Problem: Tissue Perfusion: Goal: Adequacy of tissue perfusion will improve Outcome: Adequate for Discharge   Problem: Education: Goal: Knowledge of General Education information will improve Description: Including pain rating scale, medication(s)/side effects and non-pharmacologic comfort measures Outcome: Adequate for Discharge   Problem: Health Behavior/Discharge Planning: Goal: Ability to manage health-related needs will improve Outcome: Adequate for Discharge   Problem: Clinical Measurements: Goal: Ability to maintain clinical measurements within normal limits will improve Outcome: Adequate for Discharge Goal: Will remain free from infection Outcome: Adequate for Discharge Goal: Diagnostic test results will improve Outcome: Adequate for Discharge Goal:  Respiratory complications will improve Outcome: Adequate for Discharge Goal: Cardiovascular complication will be avoided Outcome: Adequate for Discharge   Problem: Activity: Goal: Risk for activity intolerance will decrease Outcome: Adequate for Discharge   Problem: Nutrition: Goal: Adequate nutrition will be maintained Outcome: Adequate for Discharge   Problem: Coping: Goal: Level of anxiety will decrease Outcome: Adequate for Discharge   Problem: Elimination: Goal: Will not experience complications related to bowel motility Outcome: Adequate for Discharge Goal: Will not experience complications related to urinary retention Outcome: Adequate for Discharge   Problem: Pain Management: Goal: General experience of comfort will improve Outcome: Adequate for Discharge   Problem: Safety: Goal: Ability to remain free from injury will improve Outcome: Adequate for Discharge   Problem: Skin Integrity: Goal: Risk for impaired skin integrity will decrease Outcome: Adequate for Discharge   Problem: Acute Rehab PT Goals(only PT should resolve) Goal: Pt Will Go Supine/Side To Sit Outcome: Adequate for Discharge Goal: Patient Will Transfer Sit To/From Stand Outcome: Adequate for Discharge Goal: Pt Will Transfer Bed To Chair/Chair To Bed Outcome: Adequate for Discharge Goal: Pt Will Ambulate Outcome: Adequate for Discharge Goal: Pt Will Go Up/Down Stairs Outcome: Adequate for Discharge   Problem: Acute Rehab OT Goals (only OT should resolve) Goal: Pt. Will Perform Grooming Outcome: Adequate for Discharge Goal: Pt. Will Perform Lower Body Dressing Outcome: Adequate for Discharge Goal: Pt. Will Transfer To Toilet Outcome: Adequate for Discharge Goal: Pt. Will Perform Toileting-Clothing Manipulation Outcome: Adequate for Discharge Goal: Pt. Will Perform Tub/Shower Transfer Outcome: Adequate for Discharge Goal: OT Additional ADL Goal #1 Outcome: Adequate for Discharge

## 2023-02-19 NOTE — Discharge Summary (Signed)
Physician Discharge Summary   Patient: Danielle [name] MRN: [id number] DOB: Mar 31, 1934  Admit date:     02/13/2023  Discharge date: 02/19/23  Discharge Physician: Jonah Blue   PCP: Gordan Payment., MD   Recommendations at discharge:   Take Bactrim once daily for UTI prevention Change Eliquis to Xarelto and take once daily Decrease spironolactone to 12.5 mg daily Follow up with urology as scheduled Follow up with Dr. Shary Decamp in 1-2 weeks Notify PCP/urology or come to the ER should urinary or GI tract bleeding worsen  Discharge Diagnoses: Principal Problem:   Nephrolithiasis Active Problems:   Paroxysmal atrial fibrillation (HCC)   Stage 3a chronic kidney disease (HCC)   Coronary artery disease involving native coronary artery of native heart without angina pectoris   COPD (chronic obstructive pulmonary disease) (HCC)   Type 2 diabetes mellitus with hyperlipidemia (HCC)   Hypertension   Class 3 obesity   Chronic diastolic congestive heart failure (HCC)   Nocturnal hypoxemia due to emphysema (HCC)   Acute deep vein thrombosis (DVT) of right lower extremity [facility])   Hospital Course: 89yo with h/o chronic diastolic CHF, stage 3a CKD, CAD, COPD, HTN, HLD, and afib on anticoagulation who presented on 11/15 with severe back pain and dysuria, found to have renal stone and UTI.  She was also found to have an acute RLE DVT while on Eliquis, changed to Xarelto.  Assessment and Plan:  Symptomatic right renal calculus with fullness right renal pelvis s/p cystoscopy and stent placement OK to d/c home per Urology once medically clear Ongoing hematuria noted but Hgb remains stable despite resumption of AC Will dc to home today Has urology f/u in 2 weeks   UTI Urine cx negative Treated with 5 days of antibiotics per urology Regular strength bactrim daily for suppression per Urology recs   Chronic atrial fibrillation on Eliquis Currently rate controlled with  amiodarone Developed DVT on Eliquis - changed to Xarelto after evaluation by hematology Xarelto started on 11/19, will continue to monitor given ongoing hematuria to ensure Hgb stability   Chronic diastolic heart failure Change Lasix to 40 mg once daily Resume spironolactone but at 12.5 rather than 25 mg daily   COPD on home oxygen Continue supportive care with oxygen and home MDI/nebulizer Pt baseline on 2LNC at night and PRN during the day. Suspect pt may need more chronic O2 given her COPD and CHF Continue Symbicort (Dulera per formulary)   Diabetes mellitus 2 Resume glimepride, metformin, and Januvia  Semglee 10 units ordered as inpatient, will not continue at this time Continue gabapentin   Dyslipidemia Continue Crestor   History of depression Continue Celexa and clonazepam at bedtime for chronic insomnia   Acute RLE DVT Acute RLE DVT noted on LE dopplers Hematology consulted given possible Eliquis failure (not definite) Changed to Xarelto If this fails, she will need Lovenox or warfarin long-term   Stage 3a CKD Appears to be stable at this time Attempt to avoid nephrotoxic medications Outpatient f/u with PCP  Morbid obesity Body mass index is 43.9 kg/m.Marland Kitchen  Weight loss should be encouraged Outpatient PCP/bariatric medicine f/u encouraged        Consultants: Urology Hematology PT OT   Procedures: Cystoscopy, stent placement 11/16   Antibiotics: Ceftriaxone x 5 days   30 Day Unplanned Readmission Risk Score    Flowsheet Row ED to Hosp-Admission (Current) from 02/13/2023 in [facility]  SURGICAL  30 Day Unplanned Readmission Risk Score (%) 21.57 Filed  at 02/19/2023 0801       This score is the patient's risk of an unplanned readmission within 30 days of being discharged (0 -100%). The score is based on dignosis, age, lab data, medications, orders, and past utilization.   Low:  0-14.9   Medium: 15-21.9   High: 22-29.9   Extreme:  30 and above          Pain control - [place] Controlled Substance Reporting System database was reviewed. and patient was instructed, not to drive, operate heavy machinery, perform activities at heights, swimming or participation in water activities or provide baby-sitting services while on Pain, Sleep and Anxiety Medications; until their outpatient Physician has advised to do so again. Also recommended to not to take more than prescribed Pain, Sleep and Anxiety Medications.    Disposition: Home Diet recommendation:  Carb modified diet DISCHARGE MEDICATION: Allergies as of 02/19/2023       Reactions   Codeine Swelling   Levofloxacin    Other reaction(s): Malaise (intolerance)   Penicillins Swelling   Has patient had a PCN reaction causing immediate rash, facial/tongue/throat swelling, SOB or lightheadedness with hypotension:YES Has patient had a PCN reaction causing severe rash involving mucus membranes or skin necrosis: NO Has patient had a PCN reaction that required hospitalization NO Has patient had a PCN reaction occurring within the last 10 years: NO If all of the above answers are "NO", then may proceed with Cephalosporin use.   Tape Rash        Medication List     STOP taking these medications    apixaban 5 MG Tabs tablet Commonly known as: ELIQUIS       TAKE these medications    acetaminophen 500 MG tablet Commonly known as: TYLENOL Take 500 mg by mouth as needed for mild pain (pain score 1-3) or headache.   albuterol 108 (90 Base) MCG/ACT inhaler Commonly known as: VENTOLIN HFA Inhale 2 puffs into the lungs every 6 (six) hours as needed for wheezing or shortness of breath.   amiodarone 200 MG tablet Commonly known as: PACERONE Take 1 tablet by mouth once daily   budesonide-formoterol 160-4.5 MCG/ACT inhaler Commonly known as: SYMBICORT Inhale 2 puffs into the lungs 2 (two) times daily.   carboxymethylcellulose 0.5 % Soln Commonly known as:  REFRESH PLUS Place 1 drop into both eyes as needed.   citalopram 20 MG tablet Commonly known as: CELEXA Take 20 mg by mouth daily.   clonazePAM 1 MG tablet Commonly known as: KLONOPIN Take 1 mg by mouth at bedtime.   diclofenac Sodium 1 % Gel Commonly known as: VOLTAREN APPLY 2 GRAMS TOPICALLY TO AFFECTED AREA THREE TIMES DAILY What changed: See the new instructions.   ferrous sulfate 325 (65 FE) MG EC tablet Take 325 mg by mouth daily with breakfast.   furosemide 40 MG tablet Commonly known as: LASIX Take 1 tablet (40 mg total) by mouth daily. Start taking on: February 20, 2023 What changed:  medication strength how much to take   gabapentin 100 MG capsule Commonly known as: NEURONTIN Take 100 mg by mouth at bedtime.   glimepiride 4 MG tablet Commonly known as: AMARYL Take 4 mg by mouth daily.   metFORMIN 500 MG tablet Commonly known as: GLUCOPHAGE Take 500 mg by mouth daily. When sugar is elevated, give 1 additional dose in the evening   multivitamin capsule Take 1 capsule by mouth daily.   nitroGLYCERIN 0.4 MG SL tablet Commonly known as: Futures trader  1 tablet (0.4 mg total) under the tongue every 5 (five) minutes as needed for chest pain.   nystatin powder Commonly known as: MYCOSTATIN/NYSTOP Apply 1 Application topically 2 (two) times daily as needed (yeast).   pantoprazole 40 MG tablet Commonly known as: PROTONIX Take 40 mg by mouth 2 (two) times daily.   potassium chloride 10 MEQ CR capsule Commonly known as: MICRO-K Take 10 mEq by mouth daily.   rivaroxaban 20 MG Tabs tablet Commonly known as: XARELTO Take 1 tablet (20 mg total) by mouth daily with supper.   rOPINIRole 0.25 MG tablet Commonly known as: REQUIP Take 0.25 mg by mouth at bedtime.   rosuvastatin 40 MG tablet Commonly known as: CRESTOR Take 40 mg by mouth daily.   senna 8.6 MG Tabs tablet Commonly known as: SENOKOT Take 2 tablets by mouth at bedtime.   sitaGLIPtin 100 MG  tablet Commonly known as: JANUVIA Take 100 mg by mouth daily.   spironolactone 25 MG tablet Commonly known as: ALDACTONE Take 0.5 tablets (12.5 mg total) by mouth daily. Start taking on: February 20, 2023   sulfamethoxazole-trimethoprim 400-80 MG tablet Commonly known as: BACTRIM Take 1 tablet by mouth daily. Start taking on: February 20, 2023   Vitamin D (Ergocalciferol) 1.25 MG (50000 UNIT) Caps capsule Commonly known as: DRISDOL Take 1 capsule by mouth once a week. Take on Sunday        Follow-up Information     [facility] Follow up.   Specialty: Home Health Services Why: [address] information: [address] [place] [address] [phone]                Discharge Exam: Filed Weights   02/16/23 0332 02/18/23 0500 02/19/23 0205  Weight: 110.3 kg 114.2 kg 114.2 kg    Subjective: Up with therapy today without difficulty.  Still with hematuria but appears to be clearing some.  Patient and daughter feel good a bout discharge.   Objective: Vitals:   02/19/23 0326 02/19/23 0754  BP: 133/76 (!) 168/95  Pulse: (!) 58 62  Resp: 17 18  Temp: 97.8 F (36.6 C) 97.9 F (36.6 C)  SpO2: 100% 99%    Intake/Output Summary (Last 24 hours) at 02/19/2023 1257 Last data filed at 02/19/2023 1230 Gross per 24 hour  Intake 1200 ml  Output 800 ml  Net 400 ml   Filed Weights   02/16/23 0332 02/18/23 0500 02/19/23 0205  Weight: 110.3 kg 114.2 kg 114.2 kg    Exam:  General:  Appears calm and comfortable and is in NAD Eyes:  EOMI, normal lids, iris ENT:  grossly normal hearing, lips & tongue, mmm Neck:  no LAD, masses or thyromegaly Cardiovascular:  RRR, no m/r/g. No LE edema.  Respiratory:   CTA bilaterally with no wheezes/rales/rhonchi.  Normal respiratory effort. Abdomen:  soft, NT, ND Skin:  no rash or induration seen on limited exam Musculoskeletal:  grossly normal tone BUE/BLE, good ROM, no bony abnormality Psychiatric:  blunted  mood and affect, speech fluent and appropriate, AOx3 Neurologic:  CN 2-12 grossly intact, moves all extremities in coordinated fashion  Data Reviewed: I have reviewed the patient's lab results since admission.  Pertinent labs for today include:  Na++ 134 Glucose 172 BUN 18/Creatinine 1.21/GFR 43 WBC 6 Hgb 9.4, stable    Condition at discharge: improving  The results of significant diagnostics from this hospitalization (including imaging, microbiology, ancillary and laboratory) are listed below for reference.   Imaging Studies: VAS [place]  LOWER EXTREMITY VENOUS (DVT)  Result Date: 02/17/2023  Lower Venous DVT Study Patient Name:  Danielle [name]  Date of Exam:   02/16/2023 Medical Rec #: [id number]             Accession #:    [id number] Date of Birth: 03-Jun-1933             Patient Gender: F Patient Age:   74 years Exam Location:  [facility] Procedure:      VAS [place] LOWER EXTREMITY VENOUS (DVT) Referring Phys: Jeannett Senior CHIU --------------------------------------------------------------------------------  Indications: Mid right calf pain status post temporary cessation of Eliquis for cytoscopy and stent 02/14/23.  Anticoagulation: Eliquis for Atrial fibrillation. Limitations: Body habitus and calf pain and involuntary movement. Comparison Study: No prior study on file Performing Technologist: Sherren Kerns RVS  Examination Guidelines: A complete evaluation includes B-mode imaging, spectral Doppler, color Doppler, and power Doppler as needed of all accessible portions of each vessel. Bilateral testing is considered an integral part of a complete examination. Limited examinations for reoccurring indications may be performed as noted. The reflux portion of the exam is performed with the patient in reverse Trendelenburg.  +---------+---------------+---------+-----------+----------+-------------------+ RIGHT    CompressibilityPhasicitySpontaneityPropertiesThrombus Aging       +---------+---------------+---------+-----------+----------+-------------------+ CFV      Full           Yes      Yes                                      +---------+---------------+---------+-----------+----------+-------------------+ SFJ      Full                                                             +---------+---------------+---------+-----------+----------+-------------------+ FV Prox  Full                                                             +---------+---------------+---------+-----------+----------+-------------------+ FV Mid   Full                                                             +---------+---------------+---------+-----------+----------+-------------------+ FV DistalFull                                                             +---------+---------------+---------+-----------+----------+-------------------+ PFV      Full                                                             +---------+---------------+---------+-----------+----------+-------------------+  POP      Full           Yes      Yes                                      +---------+---------------+---------+-----------+----------+-------------------+ PTV      None                                         acute mid calf at                                                         site of pain        +---------+---------------+---------+-----------+----------+-------------------+ PERO     None                                         acute mid calf at                                                         site of pain        +---------+---------------+---------+-----------+----------+-------------------+   +----+---------------+---------+-----------+----------+--------------+ LEFTCompressibilityPhasicitySpontaneityPropertiesThrombus Aging +----+---------------+---------+-----------+----------+--------------+ CFV Full           Yes       Yes                                 +----+---------------+---------+-----------+----------+--------------+     Summary: RIGHT: - Findings consistent with acute deep vein thrombosis involving the mid portions of the right posterior tibial veins and right peroneal veins at site of pain.  - No cystic structure found in the popliteal fossa.  LEFT: - No evidence of common femoral vein obstruction.   *See table(s) above for measurements and observations. Electronically signed by Gerarda Fraction on 02/17/2023 at 11:40:15 AM.    Final    DG C-Arm 1-60 Min  Result Date: 02/16/2023 CLINICAL DATA:  Surgery EXAM: Intraoperative fluoroscopy CONTRAST:  Contrast administered. Please see surgical notes for type and dose FLUOROSCOPY: Fluoroscopy Time:  32.3 seconds Radiation Exposure Index (if provided by the fluoroscopic device): 17.22 mGy Number of Acquired Spot Images: 3 COMPARISON:  CT 02/13/2019 FINDINGS: Placement of ureteral stent. Dilated collecting system. Imaging was obtained to aid in treatment. IMPRESSION: Intraoperative fluoroscopy Electronically Signed   By: Karen Kays M.D.   On: 02/16/2023 16:55   CT Renal Stone Study  Result Date: 02/13/2023 CLINICAL DATA:  Flank pain and known right proximal ureteral stone EXAM: CT ABDOMEN AND PELVIS WITHOUT CONTRAST TECHNIQUE: Multidetector CT imaging of the abdomen and pelvis was performed following the standard protocol without IV contrast. RADIATION DOSE REDUCTION: This exam was performed according to the departmental dose-optimization program which includes automated exposure control, adjustment of the mA and/or kV according to patient size and/or use of iterative reconstruction technique.  COMPARISON:  02/11/2023 FINDINGS: Lower chest: No acute abnormality. Hepatobiliary: No focal liver abnormality is seen. Status post cholecystectomy. No biliary dilatation. Pancreas: Unremarkable. No pancreatic ductal dilatation or surrounding inflammatory changes. Spleen: Normal  in size without focal abnormality. Adrenals/Urinary Tract: Adrenal glands show a left adrenal adenoma stable in appearance from the prior exam. This measures up to 18 mm and is been seen on multiple previous studies. Left kidney demonstrates nonobstructing renal calculi measuring up to 8 mm. Left ureter is within normal limits. Right kidney demonstrates multiple renal calculi stable in appearance from the prior exam. Additionally a proximal right ureteral stone is noted measuring 8-9 mm. Mild fullness of the right renal pelvis is noted similar to that seen on the prior exam. The more distal right ureter is unremarkable. The bladder is decompressed. Small left renal cyst is noted stable from the prior study. Stomach/Bowel: Scattered diverticular change of the colon is noted without evidence of diverticulitis. No obstructive changes are seen. The appendix has been surgically removed. Small bowel and stomach are unremarkable. Vascular/Lymphatic: Aortic atherosclerosis. No enlarged abdominal or pelvic lymph nodes. Reproductive: Status post hysterectomy. No adnexal masses. Other: No abdominal wall hernia or abnormality. No abdominopelvic ascites. Musculoskeletal: No acute or significant osseous findings. IMPRESSION: Bilateral nonobstructing renal calculi stable from the prior exam. Stable 8-9 mm proximal right ureteral stone with mild fullness of the right renal pelvis. Stable left adrenal lesion consistent with adenoma. This is been stable over multiple previous exams and no further follow-up is recommended. Electronically Signed   By: Alcide Clever M.D.   On: 02/13/2023 20:48   DG Chest Portable 1 View  Result Date: 02/13/2023 CLINICAL DATA:  Cough. EXAM: PORTABLE CHEST 1 VIEW COMPARISON:  Chest radiograph dated 01/15/2023 FINDINGS: Background of emphysema. No focal consolidation, pleural effusion, pneumothorax. Mild cardiomegaly. Atherosclerotic calcification of the aorta. No acute osseous pathology. IMPRESSION:  1. No active disease. 2. Emphysema. 3. Mild cardiomegaly. Electronically Signed   By: Elgie Collard M.D.   On: 02/13/2023 20:04    Microbiology: Results for orders placed or performed during the hospital encounter of 02/13/23  Resp panel by RT-PCR (RSV, Flu A&B, Covid) Anterior Nasal Swab     Status: None   Collection Time: 02/13/23  5:55 PM   Specimen: Anterior Nasal Swab  Result Value Ref Range Status   SARS Coronavirus 2 by RT PCR NEGATIVE NEGATIVE Final   Influenza A by PCR NEGATIVE NEGATIVE Final   Influenza B by PCR NEGATIVE NEGATIVE Final    Comment: (NOTE) The Xpert Xpress SARS-CoV-2/FLU/RSV plus assay is intended as an aid in the diagnosis of influenza from Nasopharyngeal swab specimens and should not be used as a sole basis for treatment. Nasal washings and aspirates are unacceptable for Xpert Xpress SARS-CoV-2/FLU/RSV testing.  Fact Sheet for Patients: BloggerCourse.com  Fact Sheet for Healthcare Providers: SeriousBroker.it  This test is not yet approved or cleared by the [place] FDA and has been authorized for detection and/or diagnosis of SARS-CoV-2 by FDA under an Emergency Use Authorization (EUA). This EUA will remain in effect (meaning this test can be used) for the duration of the COVID-19 declaration under Section 564(b)(1) of the Act, 21 U.S.C. section 360bbb-3(b)(1), unless the authorization is terminated or revoked.     Resp Syncytial Virus by PCR NEGATIVE NEGATIVE Final    Comment: (NOTE) Fact Sheet for Patients: BloggerCourse.com  Fact Sheet for Healthcare Providers: SeriousBroker.it  This test is not yet approved or cleared by the [place] FDA  and has been authorized for detection and/or diagnosis of SARS-CoV-2 by FDA under an Emergency Use Authorization (EUA). This EUA will remain in effect (meaning this test can be used) for the duration  of the COVID-19 declaration under Section 564(b)(1) of the Act, 21 U.S.C. section 360bbb-3(b)(1), unless the authorization is terminated or revoked.  Performed at [facility], [address] [place], [place] [address]   Urine Culture     Status: None   Collection Time: 02/13/23  6:07 PM   Specimen: Urine, Clean Catch  Result Value Ref Range Status   Specimen Description URINE, CLEAN CATCH  Final   Special Requests NONE  Final   Culture   Final    NO GROWTH Performed at [facility], [address], [place], [place] [address]    Report Status 02/14/2023 FINAL  Final  Blood culture (routine x 2)     Status: None   Collection Time: 02/13/23  7:51 PM   Specimen: BLOOD  Result Value Ref Range Status   Specimen Description BLOOD LEFT ANTECUBITAL  Final   Special Requests   Final    BOTTLES DRAWN AEROBIC AND ANAEROBIC Blood Culture results may not be optimal due to an excessive volume of blood received in culture bottles   Culture   Final    NO GROWTH 5 DAYS Performed at [facility], [address] [place], [place] [address]    Report Status 02/18/2023 FINAL  Final  Blood culture (routine x 2)     Status: None   Collection Time: 02/14/23  4:50 AM   Specimen: BLOOD RIGHT ARM  Result Value Ref Range Status   Specimen Description BLOOD RIGHT ARM  Final   Special Requests   Final    BOTTLES DRAWN AEROBIC AND ANAEROBIC Blood Culture adequate volume   Culture   Final    NO GROWTH 5 DAYS Performed at [facility], [address] [place], [place] [address]    Report Status 02/19/2023 FINAL  Final    Labs: CBC: Recent Labs  Lab 02/13/23 1252 02/15/23 0636 02/16/23 0841 02/17/23 1030 02/18/23 0752 02/19/23 0802  WBC 10.3 7.0 5.3 5.1 5.2 6.0  NEUTROABS 7.4  --   --   --   --  3.3  HGB 10.9* 9.2* 9.7* 9.8* 9.0* 9.4*  HCT 35.5* 29.4* 30.7* 31.0* 28.3* 29.6*  MCV 100.0 101.4* 101.0* 97.5 98.3 98.3  PLT 256 201 208 203 257 257   Basic Metabolic  Panel: Recent Labs  Lab 02/15/23 0636 02/16/23 0841 02/17/23 1030 02/18/23 0752 02/19/23 0802  NA 137 137 136 138 134*  K 4.3 3.7 4.6 4.5 4.3  CL 103 103 104 103 99  CO2 26 29 24 29 26   GLUCOSE 175* 187* 250* 164* 172*  BUN 28* 21 18 18 18   CREATININE 1.01* 1.45* 1.15* 1.01* 1.21*  CALCIUM 8.5* 8.5* 8.3* 8.7* 8.6*   Liver Function Tests: Recent Labs  Lab 02/13/23 1252 02/15/23 0636 02/16/23 0841 02/17/23 1030 02/18/23 0752  AST 33 27 26 49* 32  ALT 31 27 27  35 35  ALKPHOS 56 49 51 53 50  BILITOT 0.3 0.4 0.5 0.8 0.2  PROT 6.8 5.4* 5.9* 5.7* 5.5*  ALBUMIN 3.7 2.8* 2.9* 3.0* 2.7*   CBG: Recent Labs  Lab 02/18/23 2000 02/19/23 0011 02/19/23 0329 02/19/23 0751 02/19/23 1112  GLUCAP 208* 148* 155* 144* 235*    Discharge time spent: greater than 30 minutes.  Signed:  Jonah Blue, MD [facility] 02/19/2023

## 2023-02-19 NOTE — Progress Notes (Signed)
   02/19/23 1610  Provider Notification  Provider Name/Title Liana Crocker  Date Provider Notified 02/19/23  Time Provider Notified [phone]  Method of Notification Page (secure chat)  Notification Reason Other (Comment) (Patient has blood in her urine,no blood clot,has pain to her legs on a pain scale of 8/10,vital signs 97.8,136/76,MAP 85, HR 57,R 20,100% on 2L of oxygen.Patient is anxious and concern about it and her stent placement,she wants something done.)  Provider response Other (Comment)  Date of Provider Response 02/19/23  Time of Provider Response [phone]

## 2023-02-19 NOTE — Progress Notes (Signed)
Physical Therapy Treatment Patient Details Name: Danielle [name] MRN: [id number] DOB: [birth year] Today's Date: 02/19/2023   History of Present Illness 89 y.o. female presents to [facility] 02/13/23 with severe back pain, dysuria, and difficulty urinating. Pt with B nonobstructing renal calculi, proximal R uretal stone, and UTI. S/p cystoscopy, R retrograde w/ interpretation and placement of R DJ stent.  R LE DVT 11/18. PMHx: COPD, chronic respiratory failure with hypoxia on home oxygen, coronary artery disease, morbid obesity, diabetes mellitus type 2, chronic kidney disease stage IIIa, hyperlipidemia.    PT Comments  Pt in recliner upon arrival and agreeable to PT session. Worked on gait training and stair negotiation in today's session. Pt was able to ascend/descend 1 step with 1 rail and RW. Pt needed CGA for safety. Pt was fatigued after practicing step and walking ~160 ft. Educated pt on taking standing rest breaks while focusing on deep breathing. SpO2> 96% on RA after returning to room. Pt will have 24/7 support upon discharging home for the first couple of days. Pt is progressing well towards goals. Acute PT to follow.      If plan is discharge home, recommend the following: A little help with walking and/or transfers;A little help with bathing/dressing/bathroom;Help with stairs or ramp for entrance;Assist for transportation   Can travel by private vehicle      Yes  Equipment Recommendations  None recommended by PT       Precautions / Restrictions Precautions Precautions: Fall Precaution Comments: R back stent Restrictions Weight Bearing Restrictions: No     Mobility  Bed Mobility    General bed mobility comments: in recliner upon arrival    Transfers Overall transfer level: Needs assistance Equipment used: Rolling walker (2 wheels) Transfers: Sit to/from Stand Sit to Stand: Supervision        General transfer comment: supervision for safety     Ambulation/Gait Ambulation/Gait assistance: Supervision Gait Distance (Feet): 160 Feet Assistive device: Rolling walker (2 wheels) Gait Pattern/deviations: Step-through pattern Gait velocity: dec     General Gait Details: slow and steady gait   Stairs Stairs: Yes Stairs assistance: Contact guard assist Stair Management: One rail Right, Step to pattern, Forwards Number of Stairs: 1 General stair comments: CGA for safety, pt able to use rail and RW for support          Balance Overall balance assessment: Needs assistance Sitting-balance support: No upper extremity supported, Feet supported Sitting balance-Leahy Scale: Fair     Standing balance support: Bilateral upper extremity supported, During functional activity, No upper extremity supported Standing balance-Leahy Scale: Poor Standing balance comment: reliant on RW     Cognition Arousal: Alert Behavior During Therapy: WFL for tasks assessed/performed Overall Cognitive Status: Within Functional Limits for tasks assessed        General Comments General comments (skin integrity, edema, etc.): SpO2 >96 on RA after ambulating, pt felt SOB, however suspect it is related to activity tolearnce      Pertinent Vitals/Pain Pain Assessment Pain Assessment: Faces Faces Pain Scale: Hurts a little bit Pain Location: B LE Pain Descriptors / Indicators: Aching, Discomfort Pain Intervention(s): Limited activity within patient's tolerance, Monitored during session, Repositioned     PT Goals (current goals can now be found in the care plan section) Acute Rehab PT Goals PT Goal Formulation: With patient/family Time For Goal Achievement: 03/01/23 Potential to Achieve Goals: Good Progress towards PT goals: Progressing toward goals    Frequency    Min 1X/week  AM-PAC PT "6 Clicks" Mobility   Outcome Measure  Help needed turning from your back to your side while in a flat bed without using bedrails?: A  Little Help needed moving from lying on your back to sitting on the side of a flat bed without using bedrails?: A Little Help needed moving to and from a bed to a chair (including a wheelchair)?: A Little Help needed standing up from a chair using your arms (e.g., wheelchair or bedside chair)?: A Little Help needed to walk in hospital room?: A Little Help needed climbing 3-5 steps with a railing? : A Lot 6 Click Score: 17    End of Session Equipment Utilized During Treatment: Gait belt Activity Tolerance: Patient tolerated treatment well Patient left: in chair;with call bell/phone within reach;with family/visitor present Nurse Communication: Mobility status PT Visit Diagnosis: Unsteadiness on feet (R26.81);Muscle weakness (generalized) (M62.81);History of falling (Z91.81)     Time: 1610-9604 PT Time Calculation (min) (ACUTE ONLY): 17 min  Charges:    $Therapeutic Activity: 8-22 mins PT General Charges $$ ACUTE PT VISIT: 1 Visit                     Hilton Cork, PT, DPT Secure Chat Preferred  Rehab Office [phone]   Arturo Morton Brion Aliment 02/19/2023, 12:03 PM

## 2023-02-20 ENCOUNTER — Telehealth: Payer: Self-pay | Admitting: Hematology and Oncology

## 2023-02-20 NOTE — Telephone Encounter (Signed)
Spoke with patient confirming upcoming appointment  

## 2023-02-24 ENCOUNTER — Other Ambulatory Visit (HOSPITAL_BASED_OUTPATIENT_CLINIC_OR_DEPARTMENT_OTHER): Payer: Self-pay | Admitting: Family

## 2023-02-24 DIAGNOSIS — I480 Paroxysmal atrial fibrillation: Secondary | ICD-10-CM

## 2023-02-25 ENCOUNTER — Other Ambulatory Visit (HOSPITAL_BASED_OUTPATIENT_CLINIC_OR_DEPARTMENT_OTHER): Payer: Self-pay | Admitting: Cardiology

## 2023-02-25 DIAGNOSIS — I480 Paroxysmal atrial fibrillation: Secondary | ICD-10-CM

## 2023-03-01 NOTE — Telephone Encounter (Signed)
Is patient on Eliquis or Xarelto?

## 2023-03-03 ENCOUNTER — Ambulatory Visit (HOSPITAL_BASED_OUTPATIENT_CLINIC_OR_DEPARTMENT_OTHER)
Admission: RE | Admit: 2023-03-03 | Discharge: 2023-03-03 | Disposition: A | Discharge status: Home / Self Care | Payer: PPO | Source: Ambulatory Visit | Attending: Urology | Admitting: Urology

## 2023-03-03 ENCOUNTER — Other Ambulatory Visit: Payer: Self-pay | Admitting: Urology

## 2023-03-03 ENCOUNTER — Encounter: Payer: Self-pay | Admitting: Urology

## 2023-03-03 ENCOUNTER — Ambulatory Visit: Payer: Self-pay | Admitting: Urology

## 2023-03-03 ENCOUNTER — Ambulatory Visit: Payer: PPO | Admitting: Urology

## 2023-03-03 VITALS — BP 144/41 | HR 58 | Ht 63.0 in | Wt 243.0 lb

## 2023-03-03 DIAGNOSIS — Z87440 Personal history of urinary (tract) infections: Secondary | ICD-10-CM | POA: Diagnosis not present

## 2023-03-03 DIAGNOSIS — N390 Urinary tract infection, site not specified: Secondary | ICD-10-CM

## 2023-03-03 DIAGNOSIS — N200 Calculus of kidney: Secondary | ICD-10-CM | POA: Insufficient documentation

## 2023-03-03 LAB — MICROSCOPIC EXAMINATION: RBC, Urine: >30 /[HPF] — AB (ref 0–2)

## 2023-03-03 LAB — URINALYSIS, ROUTINE W REFLEX MICROSCOPIC
Bilirubin, UA: NEGATIVE
Glucose, UA: NEGATIVE
Ketones, UA: NEGATIVE
Nitrite, UA: NEGATIVE
Specific Gravity, UA: 1.02 (ref 1.005–1.030)
Urobilinogen, Ur: 0.2 mg/dL (ref 0.2–1.0)
pH, UA: 5.5 (ref 5.0–7.5)

## 2023-03-03 NOTE — Addendum Note (Signed)
Addended by: Carolin Coy on: 03/03/2023 02:51 PM   Modules accepted: Orders

## 2023-03-03 NOTE — Progress Notes (Signed)
Assessment: 1. Nephrolithiasis   2. Recurrent UTI     Plan: Today I had a long discussion with the patient and her daughter regarding her 8 mm right proximal stone with stent in place.  She has a poor performance status with multiple medical comorbidities requiring continuous oxygen replacement.  She is obviously high risk.  She is also on anticoagulation for her A-fib and recent lower extremity DVT.  Given this our only real option is ureteroscopic management with laser and stent exchange.  Nature procedure discussed in detail including potential adverse events and complications.  The patient is scheduled to see her hematologist and lung doctor in the next week or so.  We will subsequently set up surgery following that.  Cath urine for culture today.  Patient will continue nightly Bactrim suppression  Chief Complaint: Kidney stone  HPI: Danielle [name] is a 89 y.o. female who presents for continued evaluation of nephrolithiasis. See my note at the time of initial visit at inpatient consultation 02/13/2023.  At that time the patient presented to the [facility] emergency department after having been diagnosed previously in [place] with an 8 mm right proximal stone with poor pain control.  There was also concern regarding UTI as the patient has been treated with multiple course of antibiotics for presumed UTI over the past 2 months and was on Macrodantin at the time of presentation.  No leukocytosis.  Nl renal function. UA remarkable for significant microhematuria and pyuria; nitrate neg and only few bacteria noted on micro. Lactate normal. Repeat CT stone study---- Bilateral nonobstructing renal calculi stable from the prior exam and stable 8-9 mm proximal right ureteral stone with mild fullness of the right renal pelvis.   Pain not well controlled in ED.   Patient was subsequently taken to the operating room on 02/14/2023 and underwent placement of a right double-J ureteral  stent.  Patient presents here today for follow-up.  She does report moderate stent discomfort on the right side with bladder pressure and intermittent gross hematuria with no clots.  Portions of the above documentation were copied from a prior visit for review purposes only.  Allergies: Allergies  Allergen Reactions   Codeine Swelling   Levofloxacin     Other reaction(s): Malaise (intolerance)   Penicillins Swelling    Has patient had a PCN reaction causing immediate rash, facial/tongue/throat swelling, SOB or lightheadedness with hypotension:YES Has patient had a PCN reaction causing severe rash involving mucus membranes or skin necrosis: NO Has patient had a PCN reaction that required hospitalization NO Has patient had a PCN reaction occurring within the last 10 years: NO If all of the above answers are "NO", then may proceed with Cephalosporin use.   Tape Rash    PMH: Past Medical History:  Diagnosis Date   Anginal pain (HCC)    Arthritis    Asthma    At high risk for falls 11/11/2017   Chest pain 11/09/2015   CHF (congestive heart failure) (HCC)    Chronic diastolic congestive heart failure (HCC) 11/11/2015   Chronic insomnia 10/24/2015   Chronic pain of both knees 05/14/2018   Chronic respiratory failure with hypoxia (HCC) 05/05/2017   CKD (chronic kidney disease) stage 3, GFR 30-59 ml/min (HCC) 05/07/2017   COPD (chronic obstructive pulmonary disease) (HCC)    Coronary artery disease    Coronary artery disease involving native coronary artery of native heart without angina pectoris 11/14/2014   Overview:  40% RCA a cardiac catheter in 2014  Current mild episode of major depressive disorder (HCC) 11/14/2014   Depression    DOE (dyspnea on exertion) 09/07/2018   Dysphagia 08/12/2018   Edema 08/12/2018   Elevated blood sugar 11/09/2015   Emphysema lung (HCC)    Flat feet, bilateral 05/30/2020   Gastroesophageal reflux disease without esophagitis 11/11/2017   GERD  (gastroesophageal reflux disease)    Headache    History of hiatal hernia    Hyperlipidemia    Hypertension    Insomnia    Internal hemorrhoids 11/26/2018   Iron deficiency anemia 08/12/2018   Malaise and fatigue 08/14/2015   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/26/2018   Mesenteric mass 02/19/2015   Midsternal chest pain 11/11/2015   Mixed hyperlipidemia 11/14/2014   Morbid obesity (HCC) 09/07/2018   OSA (obstructive sleep apnea) 12/16/2019   Osteoporosis    Panlobular emphysema (HCC) 11/09/2015   Pneumonia 2013   PONV (postoperative nausea and vomiting)    Positive D-dimer 09/08/2018   Primary osteoarthritis involving multiple joints 08/14/2015   Restless legs syndrome 03/04/2018   Senile purpura (HCC) 08/12/2018   Shortness of breath dyspnea    with exertion   Stasis edema of left lower extremity 04/13/2018   Strain of right shoulder 08/12/2018   Type 2 diabetes mellitus with stage 3 chronic kidney disease (HCC) 08/14/2015   Unstable angina (HCC) 10/30/2018   Vitamin B12 deficiency 04/13/2018   Vitamin D deficiency 02/27/2016    PSH: Past Surgical History:  Procedure Laterality Date   ABDOMINAL HYSTERECTOMY     APPENDECTOMY     BREAST SURGERY  40 yrs. ago   growth removed in each breast-benign   CARDIAC CATHETERIZATION     12/14/12 LHC ([facility]): few mild stenotic areas max 40% of ectatic RCA o/w NL coronaries, EF 65%. Med tx.   CATARACT EXTRACTION W/ INTRAOCULAR LENS  IMPLANT, BILATERAL Bilateral 15 yrs ago   CHOLECYSTECTOMY     COLONOSCOPY  04/22/2013   Diverticulosis in the sigmoid colon. Non bleeding internal hemorrhoids. Normal mucosa vascular pattern in the entire examined colon. Three 6 mm polyps n the descending colon. Resected and retrieved.    CYSTOSCOPY WITH RETROGRADE PYELOGRAM, URETEROSCOPY AND STENT PLACEMENT Right 02/14/2023   Procedure: CYSTOSCOPY WITH RETROGRADE PYELOGRAM, URETEROSCOPY AND STENT PLACEMENT;  Surgeon: Joline Maxcy, MD;  Location: [facility] OR;  Service: Urology;   Laterality: Right;   FOOT SURGERY     LAPAROSCOPIC APPENDECTOMY N/A 02/19/2015   Procedure: EXCISION OF MESENTERIC MASS;  Surgeon: Almond Lint, MD;  Location: [facility] OR;  Service: General;  Laterality: N/A;   REPLACEMENT TOTAL KNEE BILATERAL      SH: Social History   Tobacco Use   Smoking status: Former    Current packs/day: 0.00    Types: Cigarettes    Quit date: 08/21/2016    Years since quitting: 6.5   Smokeless tobacco: Never  Vaping Use   Vaping status: Never Used  Substance Use Topics   Alcohol use: No   Drug use: No    ROS: Constitutional:  Negative for fever, chills, weight loss CV: Negative for chest pain, previous MI, hypertension Respiratory:  Negative for shortness of breath, wheezing, sleep apnea, frequent cough GI:  Negative for nausea, vomiting, bloody stool, GERD  PE: There were no vitals taken for this visit. GENERAL APPEARANCE: Morbidly obese white female on continuous oxygen and in wheelchair HEENT:  Atraumatic, normocephalic NECK:  Supple without lymphadenopathy  COR:  irreg rate LUNGS:  CTA ABDOMEN:  Soft, non-tender, no masses EXTREMITIES:  Moves  all extremities well NEUROLOGIC:  Alert and oriented x 3 MENTAL STATUS:  appropriate SKIN:  Warm, dry, and intact   Results: Cath urine for culture

## 2023-03-03 NOTE — H&P (View-Only) (Signed)
 Assessment: 1. Nephrolithiasis   2. Recurrent UTI     Plan: Today I had a long discussion with the patient and her daughter regarding her 8 mm right proximal stone with stent in place.  She has a poor performance status with multiple medical comorbidities requiring continuous oxygen replacement.  She is obviously high risk.  She is also on anticoagulation for her A-fib and recent lower extremity DVT.  Given this our only real option is ureteroscopic management with laser and stent exchange.  Nature procedure discussed in detail including potential adverse events and complications.  The patient is scheduled to see her hematologist and lung doctor in the next week or so.  We will subsequently set up surgery following that.  Cath urine for culture today.  Patient will continue nightly Bactrim suppression  Chief Complaint: Kidney stone  HPI: Danielle [name] is a 89 y.o. female who presents for continued evaluation of nephrolithiasis. See my note at the time of initial visit at inpatient consultation 02/13/2023.  At that time the patient presented to the [facility] emergency department after having been diagnosed previously in [place] with an 8 mm right proximal stone with poor pain control.  There was also concern regarding UTI as the patient has been treated with multiple course of antibiotics for presumed UTI over the past 2 months and was on Macrodantin at the time of presentation.  No leukocytosis.  Nl renal function. UA remarkable for significant microhematuria and pyuria; nitrate neg and only few bacteria noted on micro. Lactate normal. Repeat CT stone study---- Bilateral nonobstructing renal calculi stable from the prior exam and stable 8-9 mm proximal right ureteral stone with mild fullness of the right renal pelvis.   Pain not well controlled in ED.   Patient was subsequently taken to the operating room on 02/14/2023 and underwent placement of a right double-J ureteral  stent.  Patient presents here today for follow-up.  She does report moderate stent discomfort on the right side with bladder pressure and intermittent gross hematuria with no clots.  Portions of the above documentation were copied from a prior visit for review purposes only.  Allergies: Allergies  Allergen Reactions   Codeine Swelling   Levofloxacin     Other reaction(s): Malaise (intolerance)   Penicillins Swelling    Has patient had a PCN reaction causing immediate rash, facial/tongue/throat swelling, SOB or lightheadedness with hypotension:YES Has patient had a PCN reaction causing severe rash involving mucus membranes or skin necrosis: NO Has patient had a PCN reaction that required hospitalization NO Has patient had a PCN reaction occurring within the last 10 years: NO If all of the above answers are "NO", then may proceed with Cephalosporin use.   Tape Rash    PMH: Past Medical History:  Diagnosis Date   Anginal pain (HCC)    Arthritis    Asthma    At high risk for falls 11/11/2017   Chest pain 11/09/2015   CHF (congestive heart failure) (HCC)    Chronic diastolic congestive heart failure (HCC) 11/11/2015   Chronic insomnia 10/24/2015   Chronic pain of both knees 05/14/2018   Chronic respiratory failure with hypoxia (HCC) 05/05/2017   CKD (chronic kidney disease) stage 3, GFR 30-59 ml/min (HCC) 05/07/2017   COPD (chronic obstructive pulmonary disease) (HCC)    Coronary artery disease    Coronary artery disease involving native coronary artery of native heart without angina pectoris 11/14/2014   Overview:  40% RCA a cardiac catheter in 2014  Current mild episode of major depressive disorder (HCC) 11/14/2014   Depression    DOE (dyspnea on exertion) 09/07/2018   Dysphagia 08/12/2018   Edema 08/12/2018   Elevated blood sugar 11/09/2015   Emphysema lung (HCC)    Flat feet, bilateral 05/30/2020   Gastroesophageal reflux disease without esophagitis 11/11/2017   GERD  (gastroesophageal reflux disease)    Headache    History of hiatal hernia    Hyperlipidemia    Hypertension    Insomnia    Internal hemorrhoids 11/26/2018   Iron deficiency anemia 08/12/2018   Malaise and fatigue 08/14/2015   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/26/2018   Mesenteric mass 02/19/2015   Midsternal chest pain 11/11/2015   Mixed hyperlipidemia 11/14/2014   Morbid obesity (HCC) 09/07/2018   OSA (obstructive sleep apnea) 12/16/2019   Osteoporosis    Panlobular emphysema (HCC) 11/09/2015   Pneumonia 2013   PONV (postoperative nausea and vomiting)    Positive D-dimer 09/08/2018   Primary osteoarthritis involving multiple joints 08/14/2015   Restless legs syndrome 03/04/2018   Senile purpura (HCC) 08/12/2018   Shortness of breath dyspnea    with exertion   Stasis edema of left lower extremity 04/13/2018   Strain of right shoulder 08/12/2018   Type 2 diabetes mellitus with stage 3 chronic kidney disease (HCC) 08/14/2015   Unstable angina (HCC) 10/30/2018   Vitamin B12 deficiency 04/13/2018   Vitamin D deficiency 02/27/2016    PSH: Past Surgical History:  Procedure Laterality Date   ABDOMINAL HYSTERECTOMY     APPENDECTOMY     BREAST SURGERY  40 yrs. ago   growth removed in each breast-benign   CARDIAC CATHETERIZATION     12/14/12 LHC ([facility]): few mild stenotic areas max 40% of ectatic RCA o/w NL coronaries, EF 65%. Med tx.   CATARACT EXTRACTION W/ INTRAOCULAR LENS  IMPLANT, BILATERAL Bilateral 15 yrs ago   CHOLECYSTECTOMY     COLONOSCOPY  04/22/2013   Diverticulosis in the sigmoid colon. Non bleeding internal hemorrhoids. Normal mucosa vascular pattern in the entire examined colon. Three 6 mm polyps n the descending colon. Resected and retrieved.    CYSTOSCOPY WITH RETROGRADE PYELOGRAM, URETEROSCOPY AND STENT PLACEMENT Right 02/14/2023   Procedure: CYSTOSCOPY WITH RETROGRADE PYELOGRAM, URETEROSCOPY AND STENT PLACEMENT;  Surgeon: Joline Maxcy, MD;  Location: [facility] OR;  Service: Urology;   Laterality: Right;   FOOT SURGERY     LAPAROSCOPIC APPENDECTOMY N/A 02/19/2015   Procedure: EXCISION OF MESENTERIC MASS;  Surgeon: Almond Lint, MD;  Location: [facility] OR;  Service: General;  Laterality: N/A;   REPLACEMENT TOTAL KNEE BILATERAL      SH: Social History   Tobacco Use   Smoking status: Former    Current packs/day: 0.00    Types: Cigarettes    Quit date: 08/21/2016    Years since quitting: 6.5   Smokeless tobacco: Never  Vaping Use   Vaping status: Never Used  Substance Use Topics   Alcohol use: No   Drug use: No    ROS: Constitutional:  Negative for fever, chills, weight loss CV: Negative for chest pain, previous MI, hypertension Respiratory:  Negative for shortness of breath, wheezing, sleep apnea, frequent cough GI:  Negative for nausea, vomiting, bloody stool, GERD  PE: There were no vitals taken for this visit. GENERAL APPEARANCE: Morbidly obese white female on continuous oxygen and in wheelchair HEENT:  Atraumatic, normocephalic NECK:  Supple without lymphadenopathy  COR:  irreg rate LUNGS:  CTA ABDOMEN:  Soft, non-tender, no masses EXTREMITIES:  Moves  all extremities well NEUROLOGIC:  Alert and oriented x 3 MENTAL STATUS:  appropriate SKIN:  Warm, dry, and intact   Results: Cath urine for culture

## 2023-03-04 LAB — URINE CULTURE: Organism ID, Bacteria: NO GROWTH

## 2023-03-05 ENCOUNTER — Other Ambulatory Visit: Payer: Self-pay

## 2023-03-05 DIAGNOSIS — I480 Paroxysmal atrial fibrillation: Secondary | ICD-10-CM

## 2023-03-05 MED ORDER — AMIODARONE HCL 200 MG PO TABS: 200.0000 mg | ORAL_TABLET | Freq: Every day | ORAL | 2 refills | Status: DC

## 2023-03-06 ENCOUNTER — Other Ambulatory Visit: Payer: Self-pay | Admitting: *Deleted

## 2023-03-06 ENCOUNTER — Inpatient Hospital Stay: Payer: PPO

## 2023-03-06 ENCOUNTER — Inpatient Hospital Stay: Payer: PPO | Attending: Hematology and Oncology | Admitting: Hematology and Oncology

## 2023-03-06 VITALS — BP 128/89 | HR 92 | Temp 98.5°F | Resp 16 | Wt 246.1 lb

## 2023-03-06 DIAGNOSIS — R319 Hematuria, unspecified: Secondary | ICD-10-CM | POA: Diagnosis present

## 2023-03-06 DIAGNOSIS — R310 Gross hematuria: Secondary | ICD-10-CM

## 2023-03-06 DIAGNOSIS — Z7901 Long term (current) use of anticoagulants: Secondary | ICD-10-CM | POA: Diagnosis not present

## 2023-03-06 DIAGNOSIS — R600 Localized edema: Secondary | ICD-10-CM | POA: Insufficient documentation

## 2023-03-06 DIAGNOSIS — Z86718 Personal history of other venous thrombosis and embolism: Secondary | ICD-10-CM | POA: Insufficient documentation

## 2023-03-06 DIAGNOSIS — Z87891 Personal history of nicotine dependence: Secondary | ICD-10-CM | POA: Diagnosis not present

## 2023-03-06 DIAGNOSIS — R5381 Other malaise: Secondary | ICD-10-CM

## 2023-03-06 DIAGNOSIS — D631 Anemia in chronic kidney disease: Secondary | ICD-10-CM

## 2023-03-06 LAB — CBC WITH DIFFERENTIAL/PLATELET
Abs Immature Granulocytes: 0.02 10*3/uL (ref 0.00–0.07)
Basophils Absolute: 0.1 10*3/uL (ref 0.0–0.1)
Basophils Relative: 1 %
Eosinophils Absolute: 0.4 10*3/uL (ref 0.0–0.5)
Eosinophils Relative: 6 %
HCT: 25.1 % — ABNORMAL LOW (ref 36.0–46.0)
Hemoglobin: 7.7 g/dL — ABNORMAL LOW (ref 12.0–15.0)
Immature Granulocytes: 0 %
Lymphocytes Relative: 26 %
Lymphs Abs: 1.5 10*3/uL (ref 0.7–4.0)
MCH: 30.4 pg (ref 26.0–34.0)
MCHC: 30.7 g/dL (ref 30.0–36.0)
MCV: 99.2 fL (ref 80.0–100.0)
Monocytes Absolute: 0.6 10*3/uL (ref 0.1–1.0)
Monocytes Relative: 10 %
Neutro Abs: 3.4 10*3/uL (ref 1.7–7.7)
Neutrophils Relative %: 57 %
Platelets: 279 10*3/uL (ref 150–400)
RBC: 2.53 MIL/uL — ABNORMAL LOW (ref 3.87–5.11)
RDW: 13.1 % (ref 11.5–15.5)
WBC: 5.9 10*3/uL (ref 4.0–10.5)
nRBC: 0 % (ref 0.0–0.2)

## 2023-03-06 LAB — SAMPLE TO BLOOD BANK

## 2023-03-06 NOTE — Progress Notes (Signed)
[facility] FOLLOW UP NOTE  Patient Care Team: Gordan Payment., MD as PCP - General (Internal Medicine) Georgeanna Lea, MD as PCP - Cardiology (Cardiology)  CHIEF COMPLAINTS/PURPOSE OF CONSULTATION:  DVT follow up  ASSESSMENT & PLAN:   Hematuria Hematuria likely secondary to urinary stent placement. Hematuria not likely due to anticoagulation. Urology (Dr. Margo Aye) is managing the urinary issues and stent removal is scheduled for 03/17/2023. -Continue current anticoagulation unless hematuria worsens. -Check CBC today to assess for anemia secondary to hematuria. -CBC today showed hb drop to 7.7. We can arrange for blood transfusion, and we will loop in the cardiology team and urology team about recommendations.  Anticoagulation for history of blood clot Patient had a brief interruption in Eliquis use prior to clot formation, unclear if clot was due to failure of Eliquis or interruption in use. Patient currently on Xarelto but experiencing significant bleeding. Discussed risks and benefits of switching back to Eliquis. -Daughter feels that she had no complication on Eliquis hence they want to switch to Eliquis.  Lower extremity edema Chronic bilateral lower extremity edema, no new changes. No new interventions discussed.  Follow-up in 6 months if no issues with bleeding or clot formation. If significant bleeding occurs, patient to contact office.  Orders Placed This Encounter  Procedures   CBC with Differential/Platelet    Standing Status:   Standing    Number of Occurrences:   22    Standing Expiration Date:   03/05/2024   Sample to Blood Bank    Standing Status:   Future    Number of Occurrences:   1    Standing Expiration Date:   03/05/2024     HISTORY OF PRESENTING ILLNESS:  CAETLIN [name] 89 y.o. female is here because of DVT follow up  Discussed the use of AI scribe software for clinical note transcription with the patient, who gave verbal consent to  proceed.  The patient, with a history of a recent blood clot and a urinary stent, presents with blood in her urine. She was previously on Eliquis, but had a small break in therapy around the time of her hospitalization for the blood clot. She was switched to Xarelto and she is here for follow up. Daughter reports ongoing hematuria, and she was wondering if this is because of xarelto. She has a FU with urology next week. She has some pain in RLE but better than when she was hospitalized. She is on chronic anticoagulation for cardiac reasons. No chest pain or SOB reported.  Oncology History   No history exists.    MEDICAL HISTORY:  Past Medical History:  Diagnosis Date   Anginal pain (HCC)    Arthritis    Asthma    At high risk for falls 11/11/2017   Chest pain 11/09/2015   CHF (congestive heart failure) (HCC)    Chronic diastolic congestive heart failure (HCC) 11/11/2015   Chronic insomnia 10/24/2015   Chronic pain of both knees 05/14/2018   Chronic respiratory failure with hypoxia (HCC) 05/05/2017   CKD (chronic kidney disease) stage 3, GFR 30-59 ml/min (HCC) 05/07/2017   COPD (chronic obstructive pulmonary disease) (HCC)    Coronary artery disease    Coronary artery disease involving native coronary artery of native heart without angina pectoris 11/14/2014   Overview:  40% RCA a cardiac catheter in 2014   Current mild episode of major depressive disorder (HCC) 11/14/2014   Depression    DOE (dyspnea on exertion) 09/07/2018  Dysphagia 08/12/2018   Edema 08/12/2018   Elevated blood sugar 11/09/2015   Emphysema lung (HCC)    Flat feet, bilateral 05/30/2020   Gastroesophageal reflux disease without esophagitis 11/11/2017   GERD (gastroesophageal reflux disease)    Headache    History of hiatal hernia    Hyperlipidemia    Hypertension    Insomnia    Internal hemorrhoids 11/26/2018   Iron deficiency anemia 08/12/2018   Malaise and fatigue 08/14/2015   Melanosis coli  11/26/2018   Mesenteric mass 02/19/2015   Midsternal chest pain 11/11/2015   Mixed hyperlipidemia 11/14/2014   Morbid obesity (HCC) 09/07/2018   OSA (obstructive sleep apnea) 12/16/2019   Osteoporosis    Panlobular emphysema (HCC) 11/09/2015   Pneumonia 2013   PONV (postoperative nausea and vomiting)    Positive D-dimer 09/08/2018   Primary osteoarthritis involving multiple joints 08/14/2015   Restless legs syndrome 03/04/2018   Senile purpura (HCC) 08/12/2018   Shortness of breath dyspnea    with exertion   Stasis edema of left lower extremity 04/13/2018   Strain of right shoulder 08/12/2018   Type 2 diabetes mellitus with stage 3 chronic kidney disease (HCC) 08/14/2015   Unstable angina (HCC) 10/30/2018   Vitamin B12 deficiency 04/13/2018   Vitamin D deficiency 02/27/2016    SURGICAL HISTORY: Past Surgical History:  Procedure Laterality Date   ABDOMINAL HYSTERECTOMY     APPENDECTOMY     BREAST SURGERY  40 yrs. ago   growth removed in each breast-benign   CARDIAC CATHETERIZATION     12/14/12 LHC ([facility]): few mild stenotic areas max 40% of ectatic RCA o/w NL coronaries, EF 65%. Med tx.   CATARACT EXTRACTION W/ INTRAOCULAR LENS  IMPLANT, BILATERAL Bilateral 15 yrs ago   CHOLECYSTECTOMY     COLONOSCOPY  04/22/2013   Diverticulosis in the sigmoid colon. Non bleeding internal hemorrhoids. Normal mucosa vascular pattern in the entire examined colon. Three 6 mm polyps n the descending colon. Resected and retrieved.    CYSTOSCOPY WITH RETROGRADE PYELOGRAM, URETEROSCOPY AND STENT PLACEMENT Right 02/14/2023   Procedure: CYSTOSCOPY WITH RETROGRADE PYELOGRAM, URETEROSCOPY AND STENT PLACEMENT;  Surgeon: Joline Maxcy, MD;  Location: [facility];  Service: Urology;  Laterality: Right;   FOOT SURGERY     LAPAROSCOPIC APPENDECTOMY N/A 02/19/2015   Procedure: EXCISION OF MESENTERIC MASS;  Surgeon: Almond Lint, MD;  Location: [facility] OR;  Service: General;  Laterality: N/A;   REPLACEMENT TOTAL KNEE  BILATERAL      SOCIAL HISTORY: Social History   Socioeconomic History   Marital status: Married    Spouse name: Not on file   Number of children: Not on file   Years of education: Not on file   Highest education level: Not on file  Occupational History   Not on file  Tobacco Use   Smoking status: Former    Current packs/day: 0.00    Types: Cigarettes    Quit date: 08/21/2016    Years since quitting: 6.5   Smokeless tobacco: Never  Vaping Use   Vaping status: Never Used  Substance and Sexual Activity   Alcohol use: No   Drug use: No   Sexual activity: Not on file  Other Topics Concern   Not on file  Social History Narrative   Not on file   Social Determinants of Health   Financial Resource Strain: Not on file  Food Insecurity: No Food Insecurity (02/14/2023)   Hunger Vital Sign    Worried About Running Out of Food  in the Last Year: Never true    Ran Out of Food in the Last Year: Never true  Transportation Needs: No Transportation Needs (02/14/2023)   PRAPARE - Administrator, Civil Service (Medical): No    Lack of Transportation (Non-Medical): No  Physical Activity: Not on file  Stress: Not on file  Social Connections: Not on file  Intimate Partner Violence: Not At Risk (02/14/2023)   Humiliation, Afraid, Rape, and Kick questionnaire    Fear of Current or Ex-Partner: No    Emotionally Abused: No    Physically Abused: No    Sexually Abused: No    FAMILY HISTORY: Family History  Problem Relation Age of Onset   Heart attack Mother     ALLERGIES:  is allergic to codeine, levofloxacin, penicillins, xarelto [rivaroxaban], and tape.  MEDICATIONS:  Current Outpatient Medications  Medication Sig Dispense Refill   acetaminophen (TYLENOL) 500 MG tablet Take 500 mg by mouth as needed for mild pain (pain score 1-3) or headache.     albuterol (PROVENTIL HFA;VENTOLIN HFA) 108 (90 BASE) MCG/ACT inhaler Inhale 2 puffs into the lungs every 6 (six) hours as  needed for wheezing or shortness of breath.      amiodarone (PACERONE) 200 MG tablet Take 1 tablet (200 mg total) by mouth daily. 90 tablet 2   budesonide-formoterol (SYMBICORT) 160-4.5 MCG/ACT inhaler Inhale 2 puffs into the lungs 2 (two) times daily.     carboxymethylcellulose (REFRESH PLUS) 0.5 % SOLN Place 1 drop into both eyes daily as needed (dry eyes).     citalopram (CELEXA) 20 MG tablet Take 20 mg by mouth daily.     clonazePAM (KLONOPIN) 1 MG tablet Take 1 mg by mouth at bedtime.     diclofenac Sodium (VOLTAREN) 1 % GEL APPLY 2 GRAMS TOPICALLY TO AFFECTED AREA THREE TIMES DAILY (Patient taking differently: Apply 2 g topically at bedtime.) 100 g 0   ferrous sulfate 325 (65 FE) MG EC tablet Take 325 mg by mouth daily with breakfast.     furosemide (LASIX) 40 MG tablet Take 1 tablet (40 mg total) by mouth daily. (Patient taking differently: Take 20-40 mg by mouth See admin instructions. Take 40 mg by mouth daily, may take an additional 20 mg if needed for swelling or fluid) 30 tablet 1   gabapentin (NEURONTIN) 100 MG capsule Take 100 mg by mouth at bedtime.     glimepiride (AMARYL) 4 MG tablet Take 4 mg by mouth daily.     loratadine (CLARITIN) 10 MG tablet Take 10 mg by mouth daily.     metFORMIN (GLUCOPHAGE) 500 MG tablet Take 500 mg by mouth See admin instructions. Take 500 mg by the mouth in the morning, may take an additional 500 mg in the evening if blood sugar is above 200.     Multiple Vitamin (MULTIVITAMIN) capsule Take 1 capsule by mouth daily.     nitroGLYCERIN (NITROSTAT) 0.4 MG SL tablet Place 1 tablet (0.4 mg total) under the tongue every 5 (five) minutes as needed for chest pain. 25 tablet 11   nystatin (MYCOSTATIN/NYSTOP) powder Apply 1 Application topically 2 (two) times daily as needed (yeast).     pantoprazole (PROTONIX) 40 MG tablet Take 40 mg by mouth 2 (two) times daily.     potassium chloride (MICRO-K) 10 MEQ CR capsule Take 10 mEq by mouth daily.     rivaroxaban  (XARELTO) 20 MG TABS tablet Take 1 tablet (20 mg total) by mouth daily with supper.  30 tablet 1   rOPINIRole (REQUIP) 0.25 MG tablet Take 0.25 mg by mouth at bedtime.     rosuvastatin (CRESTOR) 40 MG tablet Take 40 mg by mouth daily.     senna (SENOKOT) 8.6 MG TABS tablet Take 2 tablets by mouth at bedtime.     sitaGLIPtin (JANUVIA) 100 MG tablet Take 100 mg by mouth daily.     spironolactone (ALDACTONE) 25 MG tablet Take 0.5 tablets (12.5 mg total) by mouth daily. 30 tablet 1   sulfamethoxazole-trimethoprim (BACTRIM) 400-80 MG tablet Take 1 tablet by mouth daily. 30 tablet 1   Vitamin D, Ergocalciferol, (DRISDOL) 1.25 MG (50000 UNIT) CAPS capsule Take 50,000 Units by mouth every Sunday.     No current facility-administered medications for this visit.   Facility-Administered Medications Ordered in Other Visits  Medication Dose Route Frequency Provider Last Rate Last Admin   clindamycin (CLEOCIN) 900 mg in dextrose 5 % 50 mL IVPB  900 mg Intravenous 60 min Pre-Op Almond Lint, MD       And   gentamicin (GARAMYCIN) 5 mg/kg in dextrose 5 % 50 mL IVPB  5 mg/kg Intravenous 60 min Pre-Op Almond Lint, MD          PHYSICAL EXAMINATION: ECOG PERFORMANCE STATUS: 2 - Symptomatic, <50% confined to bed  Vitals:   03/06/23 1355  BP: 128/89  Pulse: 92  Resp: 16  Temp: 98.5 F (36.9 C)  SpO2: (!) 87%   Filed Weights   03/06/23 1355  Weight: 246 lb 1.6 oz (111.6 kg)    GENERAL:alert, no distress and comfortable Both legs are swollen, left leg is chronically asymmetrically swollen compared to right leg.  LABORATORY DATA:  I have reviewed the data as listed Lab Results  Component Value Date   WBC 5.9 03/06/2023   HGB 7.7 (L) 03/06/2023   HCT 25.1 (L) 03/06/2023   MCV 99.2 03/06/2023   PLT 279 03/06/2023     Chemistry      Component Value Date/Time   NA 134 (L) 02/19/2023 0802   NA 140 12/23/2022 1619   K 4.3 02/19/2023 0802   CL 99 02/19/2023 0802   CO2 26 02/19/2023 0802    BUN 18 02/19/2023 0802   BUN 42 (H) 12/23/2022 1619   CREATININE 1.21 (H) 02/19/2023 0802      Component Value Date/Time   CALCIUM 8.6 (L) 02/19/2023 0802   ALKPHOS 50 02/18/2023 0752   AST 32 02/18/2023 0752   ALT 35 02/18/2023 0752   BILITOT 0.2 02/18/2023 0752   BILITOT 0.3 10/22/2018 0952       RADIOGRAPHIC STUDIES: I have personally reviewed the radiological images as listed and agreed with the findings in the report. VAS [place] LOWER EXTREMITY VENOUS (DVT)  Result Date: 02/17/2023  Lower Venous DVT Study Patient Name:  Danielle [name]  Date of Exam:   02/16/2023 Medical Rec #: [id number]             Accession #:    [id number] Date of Birth: 1933/04/30             Patient Gender: F Patient Age:   89 years Exam Location:  [facility] Procedure:      VAS [place] LOWER EXTREMITY VENOUS (DVT) Referring Phys: Jeannett Senior CHIU --------------------------------------------------------------------------------  Indications: Mid right calf pain status post temporary cessation of Eliquis for cytoscopy and stent 02/14/23.  Anticoagulation: Eliquis for Atrial fibrillation. Limitations: Body habitus and calf pain and involuntary movement. Comparison Study: No prior study  on file Performing Technologist: Sherren Kerns RVS  Examination Guidelines: A complete evaluation includes B-mode imaging, spectral Doppler, color Doppler, and power Doppler as needed of all accessible portions of each vessel. Bilateral testing is considered an integral part of a complete examination. Limited examinations for reoccurring indications may be performed as noted. The reflux portion of the exam is performed with the patient in reverse Trendelenburg.  +---------+---------------+---------+-----------+----------+-------------------+ RIGHT    CompressibilityPhasicitySpontaneityPropertiesThrombus Aging      +---------+---------------+---------+-----------+----------+-------------------+ CFV      Full           Yes      Yes                                       +---------+---------------+---------+-----------+----------+-------------------+ SFJ      Full                                                             +---------+---------------+---------+-----------+----------+-------------------+ FV Prox  Full                                                             +---------+---------------+---------+-----------+----------+-------------------+ FV Mid   Full                                                             +---------+---------------+---------+-----------+----------+-------------------+ FV DistalFull                                                             +---------+---------------+---------+-----------+----------+-------------------+ PFV      Full                                                             +---------+---------------+---------+-----------+----------+-------------------+ POP      Full           Yes      Yes                                      +---------+---------------+---------+-----------+----------+-------------------+ PTV      None                                         acute mid calf at  site of pain        +---------+---------------+---------+-----------+----------+-------------------+ PERO     None                                         acute mid calf at                                                         site of pain        +---------+---------------+---------+-----------+----------+-------------------+   +----+---------------+---------+-----------+----------+--------------+ LEFTCompressibilityPhasicitySpontaneityPropertiesThrombus Aging +----+---------------+---------+-----------+----------+--------------+ CFV Full           Yes      Yes                                 +----+---------------+---------+-----------+----------+--------------+     Summary:  RIGHT: - Findings consistent with acute deep vein thrombosis involving the mid portions of the right posterior tibial veins and right peroneal veins at site of pain.  - No cystic structure found in the popliteal fossa.  LEFT: - No evidence of common femoral vein obstruction.   *See table(s) above for measurements and observations. Electronically signed by Gerarda Fraction on 02/17/2023 at 11:40:15 AM.    Final    DG C-Arm 1-60 Min  Result Date: 02/16/2023 CLINICAL DATA:  Surgery EXAM: Intraoperative fluoroscopy CONTRAST:  Contrast administered. Please see surgical notes for type and dose FLUOROSCOPY: Fluoroscopy Time:  32.3 seconds Radiation Exposure Index (if provided by the fluoroscopic device): 17.22 mGy Number of Acquired Spot Images: 3 COMPARISON:  CT 02/13/2019 FINDINGS: Placement of ureteral stent. Dilated collecting system. Imaging was obtained to aid in treatment. IMPRESSION: Intraoperative fluoroscopy Electronically Signed   By: Karen Kays M.D.   On: 02/16/2023 16:55   CT Renal Stone Study  Result Date: 02/13/2023 CLINICAL DATA:  Flank pain and known right proximal ureteral stone EXAM: CT ABDOMEN AND PELVIS WITHOUT CONTRAST TECHNIQUE: Multidetector CT imaging of the abdomen and pelvis was performed following the standard protocol without IV contrast. RADIATION DOSE REDUCTION: This exam was performed according to the departmental dose-optimization program which includes automated exposure control, adjustment of the mA and/or kV according to patient size and/or use of iterative reconstruction technique. COMPARISON:  02/11/2023 FINDINGS: Lower chest: No acute abnormality. Hepatobiliary: No focal liver abnormality is seen. Status post cholecystectomy. No biliary dilatation. Pancreas: Unremarkable. No pancreatic ductal dilatation or surrounding inflammatory changes. Spleen: Normal in size without focal abnormality. Adrenals/Urinary Tract: Adrenal glands show a left adrenal adenoma stable in appearance  from the prior exam. This measures up to 18 mm and is been seen on multiple previous studies. Left kidney demonstrates nonobstructing renal calculi measuring up to 8 mm. Left ureter is within normal limits. Right kidney demonstrates multiple renal calculi stable in appearance from the prior exam. Additionally a proximal right ureteral stone is noted measuring 8-9 mm. Mild fullness of the right renal pelvis is noted similar to that seen on the prior exam. The more distal right ureter is unremarkable. The bladder is decompressed. Small left renal cyst is noted stable from the prior study. Stomach/Bowel: Scattered diverticular change of the colon is noted without evidence of diverticulitis. No obstructive changes are seen. The  appendix has been surgically removed. Small bowel and stomach are unremarkable. Vascular/Lymphatic: Aortic atherosclerosis. No enlarged abdominal or pelvic lymph nodes. Reproductive: Status post hysterectomy. No adnexal masses. Other: No abdominal wall hernia or abnormality. No abdominopelvic ascites. Musculoskeletal: No acute or significant osseous findings. IMPRESSION: Bilateral nonobstructing renal calculi stable from the prior exam. Stable 8-9 mm proximal right ureteral stone with mild fullness of the right renal pelvis. Stable left adrenal lesion consistent with adenoma. This is been stable over multiple previous exams and no further follow-up is recommended. Electronically Signed   By: Alcide Clever M.D.   On: 02/13/2023 20:48   DG Chest Portable 1 View  Result Date: 02/13/2023 CLINICAL DATA:  Cough. EXAM: PORTABLE CHEST 1 VIEW COMPARISON:  Chest radiograph dated 01/15/2023 FINDINGS: Background of emphysema. No focal consolidation, pleural effusion, pneumothorax. Mild cardiomegaly. Atherosclerotic calcification of the aorta. No acute osseous pathology. IMPRESSION: 1. No active disease. 2. Emphysema. 3. Mild cardiomegaly. Electronically Signed   By: Elgie Collard M.D.   On: 02/13/2023  20:04    All questions were answered. The patient knows to call the clinic with any problems, questions or concerns.      Rachel Moulds, MD 03/06/2023 3:54 PM

## 2023-03-09 ENCOUNTER — Inpatient Hospital Stay: Payer: PPO

## 2023-03-09 DIAGNOSIS — D631 Anemia in chronic kidney disease: Secondary | ICD-10-CM

## 2023-03-09 DIAGNOSIS — R310 Gross hematuria: Secondary | ICD-10-CM

## 2023-03-09 DIAGNOSIS — R5381 Other malaise: Secondary | ICD-10-CM

## 2023-03-09 DIAGNOSIS — R319 Hematuria, unspecified: Secondary | ICD-10-CM | POA: Diagnosis not present

## 2023-03-09 LAB — CBC WITH DIFFERENTIAL (CANCER CENTER ONLY)
Abs Immature Granulocytes: 0.02 10*3/uL (ref 0.00–0.07)
Basophils Absolute: 0.1 10*3/uL (ref 0.0–0.1)
Basophils Relative: 1 %
Eosinophils Absolute: 0.4 10*3/uL (ref 0.0–0.5)
Eosinophils Relative: 7 %
HCT: 23.7 % — ABNORMAL LOW (ref 36.0–46.0)
Hemoglobin: 7.1 g/dL — ABNORMAL LOW (ref 12.0–15.0)
Immature Granulocytes: 0 %
Lymphocytes Relative: 27 %
Lymphs Abs: 1.6 10*3/uL (ref 0.7–4.0)
MCH: 29.7 pg (ref 26.0–34.0)
MCHC: 30 g/dL (ref 30.0–36.0)
MCV: 99.2 fL (ref 80.0–100.0)
Monocytes Absolute: 0.7 10*3/uL (ref 0.1–1.0)
Monocytes Relative: 11 %
Neutro Abs: 3.3 10*3/uL (ref 1.7–7.7)
Neutrophils Relative %: 54 %
Platelet Count: 270 10*3/uL (ref 150–400)
RBC: 2.39 MIL/uL — ABNORMAL LOW (ref 3.87–5.11)
RDW: 13.1 % (ref 11.5–15.5)
WBC Count: 6.1 10*3/uL (ref 4.0–10.5)
nRBC: 0 % (ref 0.0–0.2)

## 2023-03-09 LAB — ABO/RH: ABO/RH(D): O POS

## 2023-03-09 LAB — PREPARE RBC (CROSSMATCH)

## 2023-03-09 MED ORDER — DIPHENHYDRAMINE HCL 25 MG PO CAPS
25.0000 mg | ORAL_CAPSULE | Freq: Once | ORAL | Status: AC
  Administered 2023-03-09: 25 mg via ORAL
  Filled 2023-03-09: qty 1

## 2023-03-09 MED ORDER — SODIUM CHLORIDE 0.9% IV SOLUTION
250.0000 mL | INTRAVENOUS | Status: DC
  Administered 2023-03-09: 100 mL via INTRAVENOUS

## 2023-03-09 MED ORDER — ACETAMINOPHEN 325 MG PO TABS
650.0000 mg | ORAL_TABLET | Freq: Once | ORAL | Status: AC
  Administered 2023-03-09: 650 mg via ORAL
  Filled 2023-03-09: qty 2

## 2023-03-09 NOTE — Patient Instructions (Signed)

## 2023-03-10 ENCOUNTER — Telehealth: Payer: Self-pay | Admitting: Cardiology

## 2023-03-10 LAB — BPAM RBC
Blood Product Expiration Date: 202501032359
ISSUE DATE / TIME: 202412091348
Unit Type and Rh: 5100

## 2023-03-10 LAB — TYPE AND SCREEN
ABO/RH(D): O POS
Antibody Screen: NEGATIVE
Unit division: 0

## 2023-03-10 NOTE — Patient Instructions (Signed)
SURGICAL WAITING ROOM VISITATION Patients having surgery or a procedure may have no more than 2 support people in the waiting area - these visitors may rotate in the visitor waiting room.   Due to an increase in RSV and influenza rates and associated hospitalizations, children ages 66 and under may not visit patients in [facility] hospitals. If the patient needs to stay at the hospital during part of their recovery, the visitor guidelines for inpatient rooms apply.  PRE-OP VISITATION  Pre-op nurse will coordinate an appropriate time for 1 support person to accompany the patient in pre-op.  This support person may not rotate.  This visitor will be contacted when the time is appropriate for the visitor to come back in the pre-op area.  Please refer to the [facility] website for the visitor guidelines for Inpatients (after your surgery is over and you are in a regular room).  You are not required to quarantine at this time prior to your surgery. However, you must do this: Hand Hygiene often Do NOT share personal items Notify your provider if you are in close contact with someone who has COVID or you develop fever 100.4 or greater, new onset of sneezing, cough, sore throat, shortness of breath or body aches.  If you test positive for Covid or have been in contact with anyone that has tested positive in the last 10 days please notify you surgeon.    Your procedure is scheduled on:  Tuesday    March 17, 2023  Report to [facility] Main Entrance: [place] entrance where the [employer] is available.   Report to admitting at: 11:30    AM  Call this number if you have any questions or problems the morning of surgery [phone]  DO NOT EAT OR DRINK ANYTHING AFTER MIDNIGHT THE NIGHT PRIOR TO YOUR SURGERY / PROCEDURE.   FOLLOW  ANY ADDITIONAL PRE OP INSTRUCTIONS YOU RECEIVED FROM YOUR SURGEON'S OFFICE!!!   Oral Hygiene is also important to reduce your risk of infection.         Remember - BRUSH YOUR TEETH THE MORNING OF SURGERY WITH YOUR REGULAR TOOTHPASTE  Do NOT smoke after Midnight the night before surgery.  STOP TAKING all Vitamins, Herbs and supplements 1 week before your surgery.   DO NOT take any of your Diabetic Medication the morning of your surgery. (Sitagliptin, Metformin, glimepiride)  Take ONLY these medicines the morning of surgery with A SIP OF WATER: Pantoprazole, Bactrim, Loratadine, citalopram (Celexa), amiodarone,  You may take Tylenol if needed.  You may use your eye drops and Inhalers if needed.    If You have been diagnosed with Sleep Apnea - Bring CPAP mask and tubing day of surgery. We will provide you with a CPAP machine on the day of your surgery.                   You may not have any metal on your body including hair pins, jewelry, and body piercing  Do not wear make-up, lotions, powders, perfumes  or deodorant  Do not wear nail polish including gel and S&S, artificial / acrylic nails, or any other type of covering on natural nails including finger and toenails. If you have artificial nails, gel coating, etc., that needs to be removed by a nail salon, Please have this removed prior to surgery. Not doing so may mean that your surgery could be cancelled or delayed if the Surgeon or anesthesia staff feels like they are  unable to monitor you safely.   Do not shave 48 hours prior to surgery to avoid nicks in your skin which may contribute to postoperative infections.   Contacts, Hearing Aids, dentures or bridgework may not be worn into surgery. DENTURES WILL BE REMOVED PRIOR TO SURGERY PLEASE DO NOT APPLY "Poly grip" OR ADHESIVES!!!  You may bring a small overnight bag with you on the day of surgery, only pack items that are not valuable. [facility] IS NOT RESPONSIBLE   FOR VALUABLES THAT ARE LOST OR STOLEN.   Patients discharged on the day of surgery will not be allowed to drive home.  Someone NEEDS to stay with you for the first 24 hours  after anesthesia.  Do not bring your home medications to the hospital. The Pharmacy will dispense medications listed on your medication list to you during your admission in the Hospital.  Special Instructions: Bring a copy of your healthcare power of attorney and living will documents the day of surgery, if you wish to have them scanned into your [facility] Records- EPIC  Please read over the following fact sheets you were given: IF YOU HAVE QUESTIONS ABOUT YOUR PRE-OP INSTRUCTIONS, PLEASE CALL [phone].   [place] - Preparing for Surgery Before surgery, you can play an important role.  Because skin is not sterile, your skin needs to be as free of germs as possible.  You can reduce the number of germs on your skin by washing with CHG (chlorahexidine gluconate) soap before surgery.  CHG is an antiseptic cleaner which kills germs and bonds with the skin to continue killing germs even after washing. Please DO NOT use if you have an allergy to CHG or antibacterial soaps.  If your skin becomes reddened/irritated stop using the CHG and inform your nurse when you arrive at Short Stay. Do not shave (including legs and underarms) for at least 48 hours prior to the first CHG shower.  You may shave your face/neck.  Please follow these instructions carefully:  1.  Shower with CHG Soap the night before surgery and the  morning of surgery.  2.  If you choose to wash your hair, wash your hair first as usual with your normal  shampoo.  3.  After you shampoo, rinse your hair and body thoroughly to remove the shampoo.                             4.  Use CHG as you would any other liquid soap.  You can apply chg directly to the skin and wash.  Gently with a scrungie or clean washcloth.  5.  Apply the CHG Soap to your body ONLY FROM THE NECK DOWN.   Do not use on face/ open                           Wound or open sores. Avoid contact with eyes, ears mouth and genitals (private parts).                        Wash face,  Genitals (private parts) with your normal soap.             6.  Wash thoroughly, paying special attention to the area where your  surgery  will be performed.  7.  Thoroughly rinse your body with warm water from the neck down.  8.  DO  NOT shower/wash with your normal soap after using and rinsing off the CHG Soap.            9.  Pat yourself dry with a clean towel.            10.  Wear clean pajamas.            11.  Place clean sheets on your bed the night of your first shower and do not  sleep with pets.  ON THE DAY OF SURGERY : Do not apply any lotions/deodorants the morning of surgery.  Please wear clean clothes to the hospital/surgery center.    FAILURE TO FOLLOW THESE INSTRUCTIONS MAY RESULT IN THE CANCELLATION OF YOUR SURGERY  PATIENT SIGNATURE_________________________________  NURSE SIGNATURE__________________________________  ________________________________________________________________________

## 2023-03-10 NOTE — Telephone Encounter (Signed)
Pt c/o medication issue:  1. Name of Medication:   furosemide (LASIX) 40 MG tablet   2. How are you currently taking this medication (dosage and times per day)?   3. Are you having a reaction (difficulty breathing--STAT)?   4. What is your medication issue?   Daughter Karoline Caldwell) stated patient had been taking 60 mg of this medication but had a hospital visit and was reduced to 40 mg.  Daughter stated patient has been having some SOB and wants to know if patient's medication should be increased.  Daughter noted patient had a blood clot in her leg around Thanksgiving, had a blood transfusion yesterday and is going to have surgery next Tuesday (12/17) for kidney stones.

## 2023-03-10 NOTE — Progress Notes (Signed)
COVID Vaccine received:  []  No [x]  Yes Date of any COVID positive Test in last 90 days:  None  PCP - Feliciana Rossetti, MD Cardiologist - Gypsy Balsam, MD  Pulmonology-Muhammad Su Monks, MD  [phone] (Work)  [phone] (Fax)  Hematology- Rachel Moulds, MD   Chest x-ray - 02-13-2023   1v  Epic EKG - 02-16-2023  Epic  Stress Test - 2017  Epic ECHO - 02-04-2022  Epic Cardiac Cath - 12-14-2012   Epic  PCR screen: []  Ordered & Completed []   No Order but Needs PROFEND     [x]   N/A for this surgery  Surgery Plan:  [x]  Ambulatory   []  Outpatient in bed  []  Admit Anesthesia:    [x]  General  []  Spinal  []   Choice []   MAC  Bowel Prep - []  No  []   Yes ______  Pacemaker / ICD device [x]  No []  Yes   Spinal Cord Stimulator:[x]  No []  Yes       History of Sleep Apnea? []  No [x]  Yes   CPAP used?- [x]  No []  Yes    Home O2- 2L  per [place]  24  Does the patient monitor blood sugar?   []  N/A   []  No []  Yes  Patient has: []  NO Hx DM   []  Pre-DM   []  DM1  [x]   DM2 Last A1c was:   7.0  on 02-14-2023     Does patient have a [employer] or [employer]? []  No []  Yes   Fasting Blood Sugar Ranges-  Checks Blood Sugar _____ times a day  Diabetic medications/ instructions: Sitagliptin, metformin and glimepiride,  Hold DOS  Blood Thinner / Instructions:Eliquis   Continue, DO NOT HOLD per Dr. Margo Aye,  Take as usual, confirmed this with Crystal.  Aspirin Instructions:  none  ERAS Protocol Ordered: [x]  No  []  Yes Patient is to be NPO after: Midnight prior  Dental hx: [x]  Dentures: Full set of dentures []  N/A      []  Bridge or Partial:                   []  Loose or Damaged teeth:   Comments: Patient was lucid and appropriate with her answers to all my PST questions. Her Daughter, [name], who is POA, was present and did help her with some of her answers. She has been taking all  her medications as directed with no lapses.   Activity level: Patient is unable to climb a flight of stairs without difficulty;  [x]  No CP  but would have SOB Patient can perform ADLs without assistance per her daughter.   Anesthesia review: DM2, A.fib, COPD, CKD3, CAD, CHF, Hx [place], PONV, GERD, OSA- no CPAP- uses Home O2 at 2L per [place] 24/7.   Patient denies shortness of breath, fever, cough and chest pain at PAT appointment.  Patient verbalized understanding and agreement to the Pre-Surgical Instructions that were given to them at this PAT appointment. Patient was also educated of the need to review these PAT instructions again prior to her surgery.I reviewed the appropriate phone numbers to call if they have any and questions or concerns.

## 2023-03-10 NOTE — Telephone Encounter (Signed)
Prescription refill request for Eliquis received. Indication: AF Last office visit: 9/24 Scr: 1.21 Age:  89 Weight: 111.6 kg

## 2023-03-11 ENCOUNTER — Telehealth: Payer: Self-pay | Admitting: Urology

## 2023-03-11 ENCOUNTER — Encounter (HOSPITAL_COMMUNITY): Payer: Self-pay

## 2023-03-11 ENCOUNTER — Encounter (HOSPITAL_COMMUNITY)
Admission: RE | Admit: 2023-03-11 | Discharge: 2023-03-11 | Disposition: A | Discharge status: Home / Self Care | Payer: PPO | Source: Ambulatory Visit | Attending: Urology | Admitting: Urology

## 2023-03-11 ENCOUNTER — Other Ambulatory Visit: Payer: Self-pay

## 2023-03-11 VITALS — BP 127/46 | HR 68 | Temp 98.4°F | Resp 22 | Ht 63.0 in | Wt 243.0 lb

## 2023-03-11 DIAGNOSIS — E119 Type 2 diabetes mellitus without complications: Secondary | ICD-10-CM | POA: Diagnosis not present

## 2023-03-11 DIAGNOSIS — I2510 Atherosclerotic heart disease of native coronary artery without angina pectoris: Secondary | ICD-10-CM | POA: Insufficient documentation

## 2023-03-11 DIAGNOSIS — Z01818 Encounter for other preprocedural examination: Secondary | ICD-10-CM | POA: Diagnosis present

## 2023-03-11 HISTORY — DX: Personal history of urinary calculi: Z87.442

## 2023-03-11 LAB — CBC
HCT: 26.7 % — ABNORMAL LOW (ref 36.0–46.0)
Hemoglobin: 8.2 g/dL — ABNORMAL LOW (ref 12.0–15.0)
MCH: 30.1 pg (ref 26.0–34.0)
MCHC: 30.7 g/dL (ref 30.0–36.0)
MCV: 98.2 fL (ref 80.0–100.0)
Platelets: 285 10*3/uL (ref 150–400)
RBC: 2.72 MIL/uL — ABNORMAL LOW (ref 3.87–5.11)
RDW: 14 % (ref 11.5–15.5)
WBC: 6.8 10*3/uL (ref 4.0–10.5)
nRBC: 0 % (ref 0.0–0.2)

## 2023-03-11 LAB — BASIC METABOLIC PANEL
Anion gap: 8 (ref 5–15)
BUN: 22 mg/dL (ref 8–23)
CO2: 28 mmol/L (ref 22–32)
Calcium: 8.8 mg/dL — ABNORMAL LOW (ref 8.9–10.3)
Chloride: 102 mmol/L (ref 98–111)
Creatinine, Ser: 1.08 mg/dL — ABNORMAL HIGH (ref 0.44–1.00)
GFR, Estimated: 50 mL/min — ABNORMAL LOW (ref >60)
Glucose, Bld: 87 mg/dL (ref 70–99)
Potassium: 3.8 mmol/L (ref 3.5–5.1)
Sodium: 138 mmol/L (ref 135–145)

## 2023-03-11 LAB — GLUCOSE, CAPILLARY: Glucose-Capillary: 87 mg/dL (ref 70–99)

## 2023-03-11 NOTE — Telephone Encounter (Signed)
Has questions about upcoming surgery

## 2023-03-11 NOTE — Telephone Encounter (Signed)
Left msg to return call if they still have questions.

## 2023-03-11 NOTE — Telephone Encounter (Signed)
Spoke with pts daughter per DPR. She stated that the pt takes 40mg  of Lasix and was on 60mg  in the hospital. She was having some increased swelling, her daughter gave her an extra 20mg  last evening and she is back to her baseline this morning. She is having blood work for surgery so her kidney function and potassium will be monitored. She will be having surgery next week. Recommended that she have surgeon fax a surgical clearance form to our office. Daughter verbalized understanding and had no further questions.

## 2023-03-12 ENCOUNTER — Encounter (HOSPITAL_COMMUNITY): Payer: Self-pay

## 2023-03-12 ENCOUNTER — Telehealth: Payer: Self-pay | Admitting: *Deleted

## 2023-03-12 NOTE — Telephone Encounter (Signed)
   Pre-operative Risk Assessment    Patient Name: Danielle [name]  DOB: 07-23-1933 MRN: [id number]  DATE OF LAST VISIT: 12/23/22 DR. KRASOWSKI DATE OF NEXT VISIT: NONE    Request for Surgical Clearance    Procedure:   CYSTO, URETEROSCOPY & STENT  Date of Surgery:  Clearance 03/17/23                                 Surgeon:  DR. Marcha Solders Surgeon's Group or Practice Name:  [facility] Phone number:  [phone] Fax number:  [phone]   Type of Clearance Requested:   - Medical  - Pharmacy:  Hold Apixaban (Eliquis)     Type of Anesthesia:  Not Indicated   Additional requests/questions:    Elpidio Anis   03/12/2023, 5:24 PM

## 2023-03-12 NOTE — Telephone Encounter (Signed)
[name] called to let us know that pt had a blood transfusion on Monday and her hemoglobin was 8 yesterday. She is continuing to lose a lot of blood.   Pre-op ask for a cardiac clearance prior to the surgery which I faxed over.

## 2023-03-12 NOTE — Progress Notes (Signed)
Case: [id number] Date/Time: 03/17/23 1215   Procedure: CYSTOSCOPY WITH RETROGRADE PYELOGRAM, URETEROSCOPY AND STENT PLACEMENT (Right)   Anesthesia type: General   Pre-op diagnosis: right ureteral stone   Location: [facility] PROCEDURE [facility] / [facility]   Surgeons: Joline Maxcy, MD       DISCUSSION: Danielle [name] is an 89 yo female with PMH of former smoking (quit 2018), HTN, HLD, CAD, CHF, asthma/COPD with chronic respiratory failure, A.fib on Eliquis, OSA, GERD, hiatal hernia, T2DM, CKD, anemia, arthritis, obesity (BMI 43).  Prior anesthesia complications include PONV  Patient was admitted from 11/15-11/21 for possible infected kidney stone. She underwent cysto with stent placement on 02/14/23 which was without complications. She was also found to have an acute RLE DVT while on Eliquis so anticoagulation was switched to Xarelto however due to significant bleeding issues she was switched back to Eliquis when she saw Hematology as outpatient on 03/06/23. She also needed a blood transfusion at that time.  Patient has chronic respiratory failure and uses 2L O2 at night and prn during the day. Uses inhalers/nebs for COPD and is on diuretics for her CHF.   Patient follows with Cardiology for multiple cardiac issues. Last seen on 12/23/22. CAD is being medically managed. Only symptom she reported was LE edema at that time which Dr. Bing Matter noted as mild. All other cardiac issues stable. Cardiac clearance requested but not received. Pt was instructed to not hold her blood thinner although per chart review she is still having active hematuria.  Patient follows with Pulmonology for COPD/asthma with chronic respiratory failure. Last seen 06/25/2022. Breathing stable at that visit. Dr. Su Monks recommended she cut out salty foods/drinks to remain euvolemic but from a COPD standpoint she was doing well.  Case discussed with Dr. Hart [place]. Will add CBC and T&S for DOS  VS: BP (!) 127/46 Comment: right arm sitting   Pulse 68   Temp 36.9 C (Oral)   Resp (!) 22   Ht 5\' 3"  (1.6 m)   Wt 110.2 kg   SpO2 97% Comment: on 2L of O2 per [place]  BMI 43.05 kg/m   PROVIDERS: PCP - Feliciana Rossetti, MD Cardiologist - Gypsy Balsam, MD  Pulmonology-Muhammad Su Monks, MD   Hematology- Rachel Moulds, MD    LABS: Labs reviewed: Acceptable for surgery. (all labs ordered are listed, but only abnormal results are displayed)  Labs Reviewed  BASIC METABOLIC PANEL - Abnormal; Notable for the following components:      Result Value   Creatinine, Ser 1.08 (*)    Calcium 8.8 (*)    GFR, Estimated 50 (*)    All other components within normal limits  CBC - Abnormal; Notable for the following components:   RBC 2.72 (*)    Hemoglobin 8.2 (*)    HCT 26.7 (*)    All other components within normal limits  GLUCOSE, CAPILLARY     IMAGES:  CXR 02/13/23:  FINDINGS: Background of emphysema. No focal consolidation, pleural effusion, pneumothorax. Mild cardiomegaly. Atherosclerotic calcification of the aorta. No acute osseous pathology.   IMPRESSION: 1. No active disease. 2. Emphysema. 3. Mild cardiomegaly.   EKG:   CV:  CT Coronaries 02/28/2022:  IMPRESSION: 1. Coronary calcium score of 1391. No percentile available given age greater than 57   2. Normal coronary origin with right dominance.   3. Poor quality study due to noise secondary to obesity, severe coronary calcifications, and patient had PVC during acquisition. However, no obstructive CAD seen   4. Mixed plaque  in the proximal LCX causes mild (25-49%) stenosis. High risk plaque features including napkin ring sign, low attenuation plaque, and positive remodeling   5. Calcified plaque causes mild (25-49%) stenosis in proximal/mid/distal RCA, proximal LAD, and OM1   CAD-RADS 2. Mild non-obstructive CAD (25-49%). Consider non-atherosclerotic causes of chest pain. Consider preventive therapy and risk factor modification.  Echo  02/04/2022:   IMPRESSIONS    1. Left ventricular ejection fraction, by estimation, is 60 to 65%. The left ventricle has normal function. The left ventricle has no regional wall motion abnormalities. Left ventricular diastolic parameters are consistent with Grade I diastolic dysfunction (impaired relaxation).  2. Right ventricular systolic function is normal. The right ventricular size is normal. There is normal pulmonary artery systolic pressure.  3. The mitral valve is normal in structure. No evidence of mitral valve regurgitation. No evidence of mitral stenosis.  4. The aortic valve is calcified. Aortic valve regurgitation is not visualized. Aortic valve sclerosis/calcification is present, without any evidence of aortic stenosis.  5. The inferior vena cava is normal in size with greater than 50% respiratory variability, suggesting right atrial pressure of 3 mmHg. Past Medical History:  Diagnosis Date   Anginal pain (HCC)    Arthritis    Asthma    At high risk for falls 11/11/2017   Chest pain 11/09/2015   CHF (congestive heart failure) (HCC)    Chronic diastolic congestive heart failure (HCC) 11/11/2015   Chronic insomnia 10/24/2015   Chronic pain of both knees 05/14/2018   Chronic respiratory failure with hypoxia (HCC) 05/05/2017   CKD (chronic kidney disease) stage 3, GFR 30-59 ml/min (HCC) 05/07/2017   COPD (chronic obstructive pulmonary disease) (HCC)    Coronary artery disease    Coronary artery disease involving native coronary artery of native heart without angina pectoris 11/14/2014   Overview:  40% RCA a cardiac catheter in 2014   Current mild episode of major depressive disorder (HCC) 11/14/2014   Depression    DOE (dyspnea on exertion) 09/07/2018   Dysphagia 08/12/2018   Edema 08/12/2018   Elevated blood sugar 11/09/2015   Emphysema lung (HCC)    Flat feet, bilateral 05/30/2020   Gastroesophageal reflux disease without esophagitis 11/11/2017   GERD  (gastroesophageal reflux disease)    Headache    History of hiatal hernia    History of kidney stones    Hyperlipidemia    Hypertension    Insomnia    Internal hemorrhoids 11/26/2018   Iron deficiency anemia 08/12/2018   Malaise and fatigue 08/14/2015   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/26/2018   Mesenteric mass 02/19/2015   Midsternal chest pain 11/11/2015   Mixed hyperlipidemia 11/14/2014   Morbid obesity (HCC) 09/07/2018   OSA (obstructive sleep apnea) 12/16/2019   Osteoporosis    Panlobular emphysema (HCC) 11/09/2015   Pneumonia 2013   PONV (postoperative nausea and vomiting)    Positive D-dimer 09/08/2018   Primary osteoarthritis involving multiple joints 08/14/2015   Restless legs syndrome 03/04/2018   Senile purpura (HCC) 08/12/2018   Shortness of breath dyspnea    with exertion   Stasis edema of left lower extremity 04/13/2018   Strain of right shoulder 08/12/2018   Type 2 diabetes mellitus with stage 3 chronic kidney disease (HCC) 08/14/2015   Unstable angina (HCC) 10/30/2018   Vitamin B12 deficiency 04/13/2018   Vitamin D deficiency 02/27/2016    Past Surgical History:  Procedure Laterality Date   ABDOMINAL HYSTERECTOMY     APPENDECTOMY     BREAST SURGERY  40 yrs. ago   growth removed in each breast-benign   CARDIAC CATHETERIZATION     12/14/12 LHC ([facility]): few mild stenotic areas max 40% of ectatic RCA o/w NL coronaries, EF 65%. Med tx.   CATARACT EXTRACTION W/ INTRAOCULAR LENS  IMPLANT, BILATERAL Bilateral 15 yrs ago   CHOLECYSTECTOMY     COLONOSCOPY  04/22/2013   Diverticulosis in the sigmoid colon. Non bleeding internal hemorrhoids. Normal mucosa vascular pattern in the entire examined colon. Three 6 mm polyps n the descending colon. Resected and retrieved.    CYSTOSCOPY WITH RETROGRADE PYELOGRAM, URETEROSCOPY AND STENT PLACEMENT Right 02/14/2023   Procedure: CYSTOSCOPY WITH RETROGRADE PYELOGRAM, URETEROSCOPY AND STENT PLACEMENT;  Surgeon: Joline Maxcy, MD;  [facility]  OR;  Service: Urology;  Laterality: Right;   FOOT SURGERY     LAPAROSCOPIC APPENDECTOMY N/A 02/19/2015   Procedure: EXCISION OF MESENTERIC MASS;  Surgeon: Almond Lint, MD;  Location: [facility] OR;  Service: General;  Laterality: N/A;   REPLACEMENT TOTAL KNEE BILATERAL      MEDICATIONS:  acetaminophen (TYLENOL) 500 MG tablet   albuterol (PROVENTIL HFA;VENTOLIN HFA) 108 (90 BASE) MCG/ACT inhaler   amiodarone (PACERONE) 200 MG tablet   apixaban (ELIQUIS) 5 MG TABS tablet   budesonide-formoterol (SYMBICORT) 160-4.5 MCG/ACT inhaler   carboxymethylcellulose (REFRESH PLUS) 0.5 % SOLN   citalopram (CELEXA) 20 MG tablet   clonazePAM (KLONOPIN) 1 MG tablet   diclofenac Sodium (VOLTAREN) 1 % GEL   ferrous sulfate 325 (65 FE) MG EC tablet   furosemide (LASIX) 40 MG tablet   gabapentin (NEURONTIN) 100 MG capsule   glimepiride (AMARYL) 4 MG tablet   loratadine (CLARITIN) 10 MG tablet   metFORMIN (GLUCOPHAGE) 500 MG tablet   Multiple Vitamin (MULTIVITAMIN) capsule   nitroGLYCERIN (NITROSTAT) 0.4 MG SL tablet   nystatin (MYCOSTATIN/NYSTOP) powder   pantoprazole (PROTONIX) 40 MG tablet   potassium chloride (MICRO-K) 10 MEQ CR capsule   rOPINIRole (REQUIP) 0.25 MG tablet   rosuvastatin (CRESTOR) 40 MG tablet   senna (SENOKOT) 8.6 MG TABS tablet   sitaGLIPtin (JANUVIA) 100 MG tablet   spironolactone (ALDACTONE) 25 MG tablet   sulfamethoxazole-trimethoprim (BACTRIM) 400-80 MG tablet   Vitamin D, Ergocalciferol, (DRISDOL) 1.25 MG (50000 UNIT) CAPS capsule   No current facility-administered medications for this encounter.    clindamycin (CLEOCIN) 900 mg in dextrose 5 % 50 mL IVPB   And   gentamicin (GARAMYCIN) 5 mg/kg in dextrose 5 % 50 mL IVPB   Ubaldo Glassing, PA-C [facility] Surgical Short Stay/Anesthesiology [facility] Phone [phone] 03/16/2023 1:05 PM

## 2023-03-13 ENCOUNTER — Telehealth: Payer: Self-pay | Admitting: Cardiology

## 2023-03-13 NOTE — Telephone Encounter (Signed)
Pt c/o medication issue:  1. Name of Medication: apixaban (ELIQUIS) 5 MG TABS tablet   2. How are you currently taking this medication (dosage and times per day)? As prescribed   3. Are you having a reaction (difficulty breathing--STAT)? No   4. What is your medication issue? Patient's daughter states Dr. Margo Aye advised she call the office to be advised on if the pt should stop eliquis due to excessive bleeding in her urine. Her hemoglobin was at 7 12/09, causing her to get a blood transfusion. This past Wednesday 12/11, her hemoglobin was 8.2. Please advise.

## 2023-03-13 NOTE — Telephone Encounter (Signed)
Per Dr. Margo Aye the blood thinner may need to be stopped but that is not his decision to make. Family aware. Advised [name] to call cardiology and let them know what is going on with pt since they are working on a cardiac clearance for patient. [name] verbalized understanding and stated she would call them now.

## 2023-03-16 ENCOUNTER — Ambulatory Visit: Payer: PPO | Admitting: Hematology and Oncology

## 2023-03-16 NOTE — Telephone Encounter (Signed)
Clarified with Dr. Bing Matter that pt should hold Eliquis for now until hematuria is resolved and treating physician advises restarting Eliquis. Betsy Coder, RN notified.

## 2023-03-16 NOTE — Telephone Encounter (Signed)
Called and spoke with pt's daughter to provide instructions per Dr Bing Matter. When I advised for pt to stop Eliquis until bleeding has been resolved per MD, pt's daughter expressed concern because pt was recently admitted with DVT. Pt admitted 02/13/23 - 02/19/23 and was instructed to discontinue Eliquis and start Xarelto at discharge. Per daughter, pt's bleeding seemed to worsen on Xarelto so hematology switched her back to Eliquis and she is still currently taking Eliquis BID. Pt is currently being seen by hematology and urology and is scheduled for a CYSTOSCOPY WITH RETROGRADE PYELOGRAM, URETEROSCOPY AND STENT PLACEMENT tomorrow, 03/17/23. Pt's daughter is now wanting to re-clarify that pt should still stop Eliquis although she was recently diagnosed with DVT.

## 2023-03-16 NOTE — Anesthesia Preprocedure Evaluation (Addendum)
Anesthesia Evaluation  Patient identified by MRN, date of birth, ID band Patient awake    Reviewed: Allergy & Precautions, H&P , NPO status , Patient's Chart, lab work & pertinent test results  History of Anesthesia Complications (+) PONV and history of anesthetic complications  Airway Mallampati: III  TM Distance: >3 FB Neck ROM: Full    Dental no notable dental hx. (+) Edentulous Upper, Edentulous Lower, Dental Advisory Given   Pulmonary shortness of breath, asthma , sleep apnea , COPD,  COPD inhaler and oxygen dependent, former smoker   Pulmonary exam normal breath sounds clear to auscultation       Cardiovascular hypertension, + CAD, + Peripheral Vascular Disease, +CHF and + DOE   Rhythm:Regular Rate:Normal     Neuro/Psych  Headaches   Depression       GI/Hepatic Neg liver ROS, hiatal hernia,GERD  Medicated,,  Endo/Other  diabetes, Type 2, Oral Hypoglycemic AgentsHypothyroidism  Class 3 obesity  Renal/GU Renal diseasenegative Renal ROS  negative genitourinary   Musculoskeletal  (+) Arthritis , Osteoarthritis,    Abdominal   Peds  Hematology  (+) Blood dyscrasia, anemia   Anesthesia Other Findings   Reproductive/Obstetrics negative OB ROS                             Anesthesia Physical Anesthesia Plan  ASA: 3  Anesthesia Plan: General   Post-op Pain Management: Tylenol PO (pre-op)*   Induction: Intravenous  PONV Risk Score and Plan: 4 or greater and Ondansetron, Dexamethasone, Propofol infusion and TIVA  Airway Management Planned: LMA  Additional Equipment:   Intra-op Plan:   Post-operative Plan: Extubation in OR  Informed Consent: I have reviewed the patients History and Physical, chart, labs and discussed the procedure including the risks, benefits and alternatives for the proposed anesthesia with the patient or authorized representative who has indicated his/her  understanding and acceptance.     Dental advisory given  Plan Discussed with: CRNA  Anesthesia Plan Comments: (See PAT note from 12/11 by K Gekas PA-C. Needs CBC and T&S DOS )        Anesthesia Quick Evaluation

## 2023-03-17 ENCOUNTER — Other Ambulatory Visit: Payer: Self-pay | Admitting: Urology

## 2023-03-17 ENCOUNTER — Ambulatory Visit (HOSPITAL_BASED_OUTPATIENT_CLINIC_OR_DEPARTMENT_OTHER): Payer: PPO | Admitting: Certified Registered"

## 2023-03-17 ENCOUNTER — Other Ambulatory Visit: Payer: Self-pay

## 2023-03-17 ENCOUNTER — Ambulatory Visit (HOSPITAL_COMMUNITY): Admission: RE | Admit: 2023-03-17 | Payer: PPO | Source: Home / Self Care | Admitting: Urology

## 2023-03-17 ENCOUNTER — Ambulatory Visit (HOSPITAL_COMMUNITY)
Admission: RE | Admit: 2023-03-17 | Discharge: 2023-03-17 | Disposition: A | Discharge status: Home / Self Care | Payer: PPO | Attending: Urology | Admitting: Urology

## 2023-03-17 ENCOUNTER — Encounter (HOSPITAL_COMMUNITY): Payer: Self-pay | Admitting: Urology

## 2023-03-17 ENCOUNTER — Encounter (HOSPITAL_COMMUNITY)
Admission: RE | Disposition: A | Discharge status: Home / Self Care | Payer: Self-pay | Source: Home / Self Care | Attending: Urology

## 2023-03-17 ENCOUNTER — Ambulatory Visit (HOSPITAL_COMMUNITY): Payer: PPO

## 2023-03-17 ENCOUNTER — Ambulatory Visit (HOSPITAL_COMMUNITY): Payer: PPO | Admitting: Physician Assistant

## 2023-03-17 DIAGNOSIS — Z6841 Body Mass Index (BMI) 40.0 and over, adult: Secondary | ICD-10-CM | POA: Diagnosis not present

## 2023-03-17 DIAGNOSIS — I4891 Unspecified atrial fibrillation: Secondary | ICD-10-CM | POA: Diagnosis not present

## 2023-03-17 DIAGNOSIS — N200 Calculus of kidney: Secondary | ICD-10-CM

## 2023-03-17 DIAGNOSIS — N201 Calculus of ureter: Secondary | ICD-10-CM | POA: Diagnosis present

## 2023-03-17 DIAGNOSIS — E119 Type 2 diabetes mellitus without complications: Secondary | ICD-10-CM

## 2023-03-17 DIAGNOSIS — I130 Hypertensive heart and chronic kidney disease with heart failure and stage 1 through stage 4 chronic kidney disease, or unspecified chronic kidney disease: Secondary | ICD-10-CM | POA: Diagnosis not present

## 2023-03-17 DIAGNOSIS — Z87440 Personal history of urinary (tract) infections: Secondary | ICD-10-CM | POA: Diagnosis not present

## 2023-03-17 DIAGNOSIS — E1122 Type 2 diabetes mellitus with diabetic chronic kidney disease: Secondary | ICD-10-CM | POA: Insufficient documentation

## 2023-03-17 DIAGNOSIS — J431 Panlobular emphysema: Secondary | ICD-10-CM | POA: Diagnosis not present

## 2023-03-17 DIAGNOSIS — E1151 Type 2 diabetes mellitus with diabetic peripheral angiopathy without gangrene: Secondary | ICD-10-CM | POA: Insufficient documentation

## 2023-03-17 DIAGNOSIS — I5032 Chronic diastolic (congestive) heart failure: Secondary | ICD-10-CM | POA: Insufficient documentation

## 2023-03-17 DIAGNOSIS — Z87891 Personal history of nicotine dependence: Secondary | ICD-10-CM | POA: Insufficient documentation

## 2023-03-17 DIAGNOSIS — N183 Chronic kidney disease, stage 3 unspecified: Secondary | ICD-10-CM | POA: Diagnosis not present

## 2023-03-17 DIAGNOSIS — Z9981 Dependence on supplemental oxygen: Secondary | ICD-10-CM | POA: Insufficient documentation

## 2023-03-17 DIAGNOSIS — Z01818 Encounter for other preprocedural examination: Secondary | ICD-10-CM

## 2023-03-17 DIAGNOSIS — Z7901 Long term (current) use of anticoagulants: Secondary | ICD-10-CM | POA: Diagnosis not present

## 2023-03-17 DIAGNOSIS — D649 Anemia, unspecified: Secondary | ICD-10-CM

## 2023-03-17 HISTORY — PX: CYSTOSCOPY WITH RETROGRADE PYELOGRAM, URETEROSCOPY AND STENT PLACEMENT: SHX5789

## 2023-03-17 LAB — CBC
HCT: 25.9 % — ABNORMAL LOW (ref 36.0–46.0)
Hemoglobin: 7.8 g/dL — ABNORMAL LOW (ref 12.0–15.0)
MCH: 29.9 pg (ref 26.0–34.0)
MCHC: 30.1 g/dL (ref 30.0–36.0)
MCV: 99.2 fL (ref 80.0–100.0)
Platelets: 250 10*3/uL (ref 150–400)
RBC: 2.61 MIL/uL — ABNORMAL LOW (ref 3.87–5.11)
RDW: 13.2 % (ref 11.5–15.5)
WBC: 4.9 10*3/uL (ref 4.0–10.5)
nRBC: 0 % (ref 0.0–0.2)

## 2023-03-17 LAB — GLUCOSE, CAPILLARY
Glucose-Capillary: 150 mg/dL — ABNORMAL HIGH (ref 70–99)
Glucose-Capillary: 125 mg/dL — ABNORMAL HIGH (ref 70–99)

## 2023-03-17 LAB — PREPARE RBC (CROSSMATCH)

## 2023-03-17 SURGERY — CYSTOURETEROSCOPY, WITH RETROGRADE PYELOGRAM AND STENT INSERTION
Anesthesia: General | Laterality: Right

## 2023-03-17 MED ORDER — DEXTROSE 5 % IV SOLN: INTRAVENOUS | Status: DC | PRN

## 2023-03-17 MED ORDER — ACETAMINOPHEN 500 MG PO TABS
1000.0000 mg | ORAL_TABLET | Freq: Once | ORAL | Status: AC
  Administered 2023-03-17: 1000 mg via ORAL
  Filled 2023-03-17: qty 2

## 2023-03-17 MED ORDER — LIDOCAINE 2% (20 MG/ML) 5 ML SYRINGE
INTRAMUSCULAR | Status: DC | PRN
  Administered 2023-03-17: 60 mg via INTRAVENOUS

## 2023-03-17 MED ORDER — FENTANYL CITRATE (PF) 100 MCG/2ML IJ SOLN
INTRAMUSCULAR | Status: DC | PRN
  Administered 2023-03-17 (×2): 25 ug via INTRAVENOUS

## 2023-03-17 MED ORDER — ONDANSETRON HCL 4 MG/2ML IJ SOLN
INTRAMUSCULAR | Status: AC
Start: 2023-03-17 — End: ?
  Filled 2023-03-17: qty 2

## 2023-03-17 MED ORDER — LACTATED RINGERS IV SOLN: INTRAVENOUS | Status: DC

## 2023-03-17 MED ORDER — IOHEXOL 300 MG/ML  SOLN
INTRAMUSCULAR | Status: DC | PRN
  Administered 2023-03-17: 8 mL

## 2023-03-17 MED ORDER — SODIUM CHLORIDE 0.9% IV SOLUTION: Freq: Once | INTRAVENOUS | Status: DC

## 2023-03-17 MED ORDER — CHLORHEXIDINE GLUCONATE 0.12 % MT SOLN
15.0000 mL | Freq: Once | OROMUCOSAL | Status: AC
  Administered 2023-03-17: 15 mL via OROMUCOSAL

## 2023-03-17 MED ORDER — ONDANSETRON HCL 4 MG/2ML IJ SOLN
INTRAMUSCULAR | Status: DC | PRN
  Administered 2023-03-17: 4 mg via INTRAVENOUS

## 2023-03-17 MED ORDER — DEXAMETHASONE SODIUM PHOSPHATE 10 MG/ML IJ SOLN
INTRAMUSCULAR | Status: AC
  Filled 2023-03-17: qty 1

## 2023-03-17 MED ORDER — FENTANYL CITRATE PF 50 MCG/ML IJ SOSY: 25.0000 ug | PREFILLED_SYRINGE | INTRAMUSCULAR | Status: DC | PRN

## 2023-03-17 MED ORDER — ORAL CARE MOUTH RINSE: 15.0000 mL | Freq: Once | OROMUCOSAL | Status: AC

## 2023-03-17 MED ORDER — EPHEDRINE SULFATE-NACL 50-0.9 MG/10ML-% IV SOSY
PREFILLED_SYRINGE | INTRAVENOUS | Status: DC | PRN
  Administered 2023-03-17: 5 mg via INTRAVENOUS

## 2023-03-17 MED ORDER — PROPOFOL 10 MG/ML IV BOLUS
INTRAVENOUS | Status: DC | PRN
  Administered 2023-03-17: 80 mg via INTRAVENOUS

## 2023-03-17 MED ORDER — FENTANYL CITRATE (PF) 100 MCG/2ML IJ SOLN
INTRAMUSCULAR | Status: AC
  Filled 2023-03-17: qty 2

## 2023-03-17 MED ORDER — INSULIN ASPART 100 UNIT/ML IJ SOLN: 0.0000 [IU] | INTRAMUSCULAR | Status: DC | PRN

## 2023-03-17 MED ORDER — SODIUM CHLORIDE 0.9 % IV SOLN
2.0000 g | INTRAVENOUS | Status: AC
  Administered 2023-03-17: 2 g via INTRAVENOUS
  Filled 2023-03-17: qty 20

## 2023-03-17 MED ORDER — LIDOCAINE HCL (PF) 2 % IJ SOLN
INTRAMUSCULAR | Status: AC
  Filled 2023-03-17: qty 5

## 2023-03-17 MED ORDER — EPHEDRINE 5 MG/ML INJ
INTRAVENOUS | Status: AC
  Filled 2023-03-17: qty 5

## 2023-03-17 MED ORDER — SODIUM CHLORIDE 0.9 % IR SOLN
Status: DC | PRN
  Administered 2023-03-17 (×2): 3000 mL via INTRAVESICAL

## 2023-03-17 SURGICAL SUPPLY — 21 items
BAG URO CATCHER STRL LF (MISCELLANEOUS) ×1 IMPLANT
BASKET ZERO TIP NITINOL 2.4FR (BASKET) IMPLANT
CATH URETERAL DUAL LUMEN 10F (MISCELLANEOUS) IMPLANT
CATH URETL OPEN END 6FR 70 (CATHETERS) IMPLANT
CLOTH BEACON ORANGE TIMEOUT ST (SAFETY) ×1 IMPLANT
FIBER LASER MOSES 200 DFL (Laser) IMPLANT
FIBER LASER MOSES 365 DFL (Laser) IMPLANT
GLOVE BIO SURGEON STRL SZ8.5 (GLOVE) ×1 IMPLANT
GOWN STRL REUS W/ TWL XL LVL3 (GOWN DISPOSABLE) ×1 IMPLANT
GUIDEWIRE AMPLATZ STIFF 0.35 (WIRE) IMPLANT
GUIDEWIRE STR DUAL SENSOR (WIRE) ×1 IMPLANT
GUIDEWIRE SUPER STIFF (WIRE) IMPLANT
GUIDEWIRE ZIPWRE .038 STRAIGHT (WIRE) IMPLANT
KIT TURNOVER KIT A (KITS) IMPLANT
MANIFOLD NEPTUNE II (INSTRUMENTS) ×1 IMPLANT
PACK CYSTO (CUSTOM PROCEDURE TRAY) ×1 IMPLANT
SHEATH NAVIGATOR HD 11/13X28 (SHEATH) IMPLANT
SHEATH NAVIGATOR HD 11/13X36 (SHEATH) IMPLANT
STENT URET 6FRX22 CONTOUR (STENTS) IMPLANT
TUBING CONNECTING 10 (TUBING) ×1 IMPLANT
TUBING UROLOGY SET (TUBING) ×1 IMPLANT

## 2023-03-17 NOTE — Interval H&P Note (Signed)
History and Physical Interval Note:  03/17/2023 12:28 PM  Danielle [name]  has presented today for surgery, with the diagnosis of right ureteral stone.  The various methods of treatment have been discussed with the patient and family. After consideration of risks, benefits and other options for treatment, the patient has consented to  Procedure(s): CYSTOSCOPY WITH RETROGRADE PYELOGRAM, URETEROSCOPY AND STENT PLACEMENT (Right) as a surgical intervention.  The patient's history has been reviewed, patient examined, no change in status, stable for surgery.  I have reviewed the patient's chart and labs.  Questions were answered to the patient's satisfaction.     Joline Maxcy

## 2023-03-17 NOTE — Telephone Encounter (Signed)
Called and spoke with pt's daughter, Karoline Caldwell. Made her aware Dr Bing Matter would like pt to continue Eliquis and continue to monitor her hemoglobin closely. Daughter verbalized understanding and stated urology and hematology will continue monitor her labs closely.

## 2023-03-17 NOTE — Transfer of Care (Signed)
Immediate Anesthesia Transfer of Care Note  Patient: Danielle [name]  Procedure(s) Performed: CYSTOSCOPY WITH RETROGRADE PYELOGRAM, URETEROSCOPY AND STENT PLACEMENT (Right)  Patient Location: PACU  Anesthesia Type:General  Level of Consciousness: awake and patient cooperative  Airway & Oxygen Therapy: Patient Spontanous Breathing and Patient connected to face mask oxygen  Post-op Assessment: Report given to RN and Post -op Vital signs reviewed and stable  Post vital signs: Reviewed and stable  Last Vitals:  Vitals Value Taken Time  BP 166/71 03/17/23 1401  Temp    Pulse 58 03/17/23 1406  Resp 15 03/17/23 1406  SpO2 100 % 03/17/23 1406  Vitals shown include unfiled device data.  Last Pain:  Vitals:   03/17/23 1241  TempSrc: Oral         Complications: No notable events documented.

## 2023-03-17 NOTE — Anesthesia Postprocedure Evaluation (Signed)
Anesthesia Post Note  Patient: Danielle [name]  Procedure(s) Performed: CYSTOSCOPY WITH RETROGRADE PYELOGRAM, URETEROSCOPY AND STENT PLACEMENT (Right)     Patient location during evaluation: PACU Anesthesia Type: General Level of consciousness: awake and alert Pain management: pain level controlled Vital Signs Assessment: post-procedure vital signs reviewed and stable Respiratory status: spontaneous breathing, nonlabored ventilation, respiratory function stable and patient connected to nasal cannula oxygen Cardiovascular status: blood pressure returned to baseline and stable Postop Assessment: no apparent nausea or vomiting Anesthetic complications: no  No notable events documented.  Last Vitals:  Vitals:   03/17/23 1433 03/17/23 1528  BP: (!) 162/59 135/70  Pulse: (!) 57 (!) 55  Resp: 15 19  Temp: 36.7 C 36.8 C  SpO2: 99% 100%    Last Pain:  Vitals:   03/17/23 1433  TempSrc: Oral  PainSc:                  Arihaan Bellucci,W. EDMOND

## 2023-03-17 NOTE — Anesthesia Procedure Notes (Signed)
Procedure Name: LMA Insertion Date/Time: 03/17/2023 1:04 PM  Performed by: Sindy Guadeloupe, CRNAPre-anesthesia Checklist: Patient identified, Emergency Drugs available, Suction available, Patient being monitored and Timeout performed Patient Re-evaluated:Patient Re-evaluated prior to induction Oxygen Delivery Method: Circle system utilized Preoxygenation: Pre-oxygenation with 100% oxygen Induction Type: IV induction Ventilation: Mask ventilation without difficulty LMA: LMA inserted and LMA with gastric port inserted LMA Size: 4.0 Number of attempts: 1 Tube secured with: Tape Dental Injury: Teeth and Oropharynx as per pre-operative assessment

## 2023-03-17 NOTE — Op Note (Signed)
OPERATIVE NOTE   Patient Name: Danielle [name]  MRN: [id number]   Date of Procedure: 03/17/2023   Preoperative diagnosis:  Right proximal ureteral stone  Postoperative diagnosis:  same  Procedure:  Cystoscopy, right retrograde pyelogram with intraoperative interpretation, right ureteroscopy with laser lithotripsy (stone dusted), placement of right dj stent (6x22cm with tether)  Attending: Concepcion Living, MD  Anesthesia: general  Estimated blood loss: minimal  Fluids: Per anesthesia record  Drains: right dj stent  Specimens: none- stone dusted  Antibiotics: rocephin  Findings: 8mm right mid ureteral stone  Indications:  Ureteral stone  Description of Procedure:  The patient was brought to the operating room where she was correctly identified and subsequently underwent satisfactory induction of general anesthesia.  She was then placed in the modified dorsolithotomy position and her external genitalia were prepped and draped in the usual fashion.  Preprocedural timeout was performed.  22 French panendoscope was inserted per urethra.  There is noted to be some moderate inflammatory change and bullous edema on the trigone given her existing stent.  At this time I placed under endoscopic and fluoroscopic control a new sensor wire alongside the existing stent and obtained a good coil within the renal collecting system.  I then grasped the stent with a flexible graspers and removed the existing stent to the urethral meatus.  A second wire was placed through this stent also coiled within the renal collecting system.  Over the second wire was able to place a 28 cm 11/13 French access sheath up to the mid ureter.  The stone was not well-visualized fluoroscopically.  I then deployed a flexible ureteroscope through the access sheath.  Approximately 2 cm above the access sheath the stone was encountered.  I then deployed the 200 m Moses laser fiber and using the lithotripsy and  subsequently dusting settings was able to dust the stone completely.  It broke very easily into tiny little pieces.  At this point I advanced the ureteroscope up to the UPJ and entered the renal collecting system.  There was no additional ureteral calculi.  The entire ureter was inspected on withdrawing the access sheath and scope there was no evidence of any ureteral injury.  At this time I advanced an open-ended catheter up to the proximal ureter and remove the wire and performed a retrograde pyelogram outlining the renal collecting system which was moderately dilated.  After replacing the wire under endoscopic and fluoroscopic control I placed a 6 x 22 cm double-J stent with good position obtained proximally and distally.  The tether was left attached to the stent for easy removal.  Patient tolerated the procedure well.  There were no apparent complications.  She was subsequently taken to the recovery room in satisfactory condition.  Complications: None  Condition: Stable, extubated, transferred to PACU  Plan: patients daughter to remove stent in 3 days.  Will arrange FU in 6 weeks with renal US prior.

## 2023-03-18 ENCOUNTER — Encounter (HOSPITAL_COMMUNITY): Payer: Self-pay | Admitting: Urology

## 2023-03-18 ENCOUNTER — Other Ambulatory Visit: Payer: Self-pay

## 2023-03-18 LAB — TYPE AND SCREEN
ABO/RH(D): O POS
Antibody Screen: NEGATIVE
Unit division: 0
Unit division: 0

## 2023-03-18 LAB — BPAM RBC
Blood Product Expiration Date: 202501112359
Blood Product Expiration Date: 202501112359
ISSUE DATE / TIME: 202412171229
ISSUE DATE / TIME: 202412171229
Unit Type and Rh: 5100
Unit Type and Rh: 5100

## 2023-03-18 MED ORDER — SULFAMETHOXAZOLE-TRIMETHOPRIM 400-80 MG PO TABS: 1.0000 | ORAL_TABLET | Freq: Every day | ORAL | 1 refills | Status: DC

## 2023-03-20 ENCOUNTER — Other Ambulatory Visit (HOSPITAL_BASED_OUTPATIENT_CLINIC_OR_DEPARTMENT_OTHER): Payer: Self-pay | Admitting: Cardiology

## 2023-03-26 ENCOUNTER — Telehealth: Payer: Self-pay

## 2023-03-26 NOTE — Telephone Encounter (Signed)
-----   Message from Joline Maxcy sent at 03/17/2023  2:13 PM EST ----- Regarding: FU I got her stone out and her daughter is going to take out her stent Friday am. I dont need to see her on 12/31. I need to see her back in about 6 weeks with renal US prior. I will place order for [place].

## 2023-03-26 NOTE — Telephone Encounter (Signed)
Per message from Dr. Margo Aye, Appt on 03/31/23 has been canceled. Spoke with [name], pt is scheduled for Jan 28th and will get her renal US the week prior.

## 2023-03-31 ENCOUNTER — Encounter: Payer: PPO | Admitting: Urology

## 2023-04-15 ENCOUNTER — Ambulatory Visit: Payer: PPO | Admitting: Podiatry

## 2023-04-15 DIAGNOSIS — M79674 Pain in right toe(s): Secondary | ICD-10-CM

## 2023-04-15 DIAGNOSIS — M79675 Pain in left toe(s): Secondary | ICD-10-CM

## 2023-04-15 DIAGNOSIS — B351 Tinea unguium: Secondary | ICD-10-CM

## 2023-04-15 DIAGNOSIS — E1151 Type 2 diabetes mellitus with diabetic peripheral angiopathy without gangrene: Secondary | ICD-10-CM | POA: Diagnosis not present

## 2023-04-15 DIAGNOSIS — L84 Corns and callosities: Secondary | ICD-10-CM | POA: Diagnosis not present

## 2023-04-15 NOTE — Progress Notes (Signed)
Subjective:  Patient ID: Danielle [name], female    DOB: 01-27-1934,  MRN: [id number]  Danielle [name] presents to clinic today for:  Chief Complaint  Patient presents with   Danielle [name]    Danielle [name], Daughter Danielle [name] is with her today. Last A1c 8.4 takes eliquis    Patient notes nails are thick and elongated, causing pain in shoe gear when ambulating.  Patient notes that she has a callus on the right foot near the great toe joint and states that it is extremely painful.  On gross exam there appears to be minimal hyperkeratotic formation.  Her daughter is with her today.  Denies history of gout.  Denies injury to the area.  She notes that she got a new pair of crocs recently and they are wider than her previous ones which were more narrow.  She acknowledges swelling in her legs and does not wear compression stockings.  She uses a walker.  She lives alone at home but her daughter will check on her and also mows her yard.  Danielle [name] is Danielle [name]., MD. last seen on 12/15/2022  Past Medical History:  Diagnosis Date   Anginal pain (HCC)    Arthritis    Asthma    At high risk for falls 11/11/2017   CHF (congestive heart failure) (HCC)    Chronic diastolic congestive heart failure (HCC) 11/11/2015   Chronic insomnia 10/24/2015   Chronic pain of both knees 05/14/2018   Chronic respiratory failure with hypoxia (HCC) 05/05/2017   CKD (chronic kidney disease) stage 3, GFR 30-59 ml/min (HCC) 05/07/2017   COPD (chronic obstructive pulmonary disease) (HCC)    Coronary artery disease    Coronary artery disease involving native coronary artery of native heart without angina pectoris 11/14/2014   Overview:  40% RCA a cardiac catheter in 2014   Current mild episode of major depressive disorder (HCC) 11/14/2014   Depression    DOE (dyspnea on exertion) 09/07/2018   Dysphagia 08/12/2018   Edema 08/12/2018   Elevated blood sugar 11/09/2015   Emphysema lung (HCC)    Flat feet, bilateral 05/30/2020    Gastroesophageal reflux disease without esophagitis 11/11/2017   GERD (gastroesophageal reflux disease)    Headache    History of hiatal hernia    History of kidney stones    Hyperlipidemia    Hypertension    Insomnia    Internal hemorrhoids 11/26/2018   Iron deficiency anemia 08/12/2018   Malaise and fatigue 08/14/2015   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/26/2018   Mesenteric mass 02/19/2015   Midsternal chest pain 11/11/2015   Mixed hyperlipidemia 11/14/2014   Morbid obesity (HCC) 09/07/2018   OSA (obstructive sleep apnea) 12/16/2019   Osteoporosis    Panlobular emphysema (HCC) 11/09/2015   Pneumonia 2013   PONV (postoperative nausea and vomiting)    Positive D-dimer 09/08/2018   Primary osteoarthritis involving multiple joints 08/14/2015   Restless legs syndrome 03/04/2018   Senile purpura (HCC) 08/12/2018   Shortness of breath dyspnea    with exertion   Stasis edema of left lower extremity 04/13/2018   Strain of right shoulder 08/12/2018   Type 2 diabetes mellitus with stage 3 chronic kidney disease (HCC) 08/14/2015   Unstable angina (HCC) 10/30/2018   Vitamin B12 deficiency 04/13/2018   Vitamin D deficiency 02/27/2016    Allergies  Allergen Reactions   Codeine Swelling   Levofloxacin     Other reaction(s): Malaise (intolerance)   Penicillins Swelling    Has  patient had a PCN reaction causing immediate rash, facial/tongue/throat swelling, SOB or lightheadedness with hypotension:YES Has patient had a PCN reaction causing severe rash involving mucus membranes or skin necrosis: NO Has patient had a PCN reaction that required hospitalization NO Has patient had a PCN reaction occurring within the last 10 years: NO If all of the above answers are "NO", then may proceed with Cephalosporin use.   Xarelto [Rivaroxaban]     Causes bleeding in stool and urine   Tape Rash    Objective:  Danielle [name] is a pleasant 89 y.o. female in NAD. AAO x 3.  Vascular Examination: Patient has  palpable DP pulse, absent PT pulse bilateral.  Delayed capillary refill bilateral toes.  Sparse digital hair bilateral.  Proximal to distal cooling WNL bilateral.  +1 pitting edema bilateral legs and ankles  Dermatological Examination: Interspaces are clear with no open lesions noted bilateral.  Skin is shiny and atrophic bilateral.  Nails are 3-69mm thick, with yellowish/brown discoloration, subungual debris and distal onycholysis x10.  There is pain with compression of nails x10.  There are hyperkeratotic lesions noted plantar medial aspect of the right first MPJ.  There is minimal callus in this area and no surrounding erythema or open lesions noted.  There is no edema in this area..     Latest Ref Rng & Units 02/14/2023    4:50 AM  Hemoglobin A1C  Hemoglobin-A1c 4.8 - 5.6 % 7.0    Patient qualifies for at-risk foot care because of diabetes with PVD.  Assessment/Plan: 1. Pain due to onychomycosis of toenails of both feet   2. Corns and callosities   3. Type II diabetes mellitus with peripheral circulatory disorder (HCC)     Mycotic nails x10 were sharply debrided with sterile nail nippers and power debriding burr to decrease bulk and length.  Hyperkeratotic lesion on the plantar medial first MPJ right foot was shaved with #312 blade.   Return in about 3 months (around 07/14/2023) for [name].   Clerance Lav, DPM, FACFAS [name]     [address] [place], [place] [address]                Office [phone]  Fax [phone]

## 2023-04-21 ENCOUNTER — Ambulatory Visit (HOSPITAL_BASED_OUTPATIENT_CLINIC_OR_DEPARTMENT_OTHER)
Admission: RE | Admit: 2023-04-21 | Discharge: 2023-04-21 | Disposition: A | Discharge status: Home / Self Care | Payer: PPO | Source: Ambulatory Visit | Attending: Urology | Admitting: Urology

## 2023-04-21 DIAGNOSIS — N200 Calculus of kidney: Secondary | ICD-10-CM | POA: Diagnosis present

## 2023-04-28 ENCOUNTER — Other Ambulatory Visit (HOSPITAL_BASED_OUTPATIENT_CLINIC_OR_DEPARTMENT_OTHER): Payer: PPO

## 2023-04-28 ENCOUNTER — Ambulatory Visit: Payer: PPO | Admitting: Urology

## 2023-05-13 ENCOUNTER — Ambulatory Visit: Payer: PPO | Admitting: Urology

## 2023-05-13 VITALS — BP 136/78 | HR 87

## 2023-05-13 DIAGNOSIS — Z09 Encounter for follow-up examination after completed treatment for conditions other than malignant neoplasm: Secondary | ICD-10-CM

## 2023-05-13 DIAGNOSIS — Z87442 Personal history of urinary calculi: Secondary | ICD-10-CM

## 2023-05-13 DIAGNOSIS — N390 Urinary tract infection, site not specified: Secondary | ICD-10-CM

## 2023-05-13 DIAGNOSIS — Z87440 Personal history of urinary (tract) infections: Secondary | ICD-10-CM

## 2023-05-13 DIAGNOSIS — N200 Calculus of kidney: Secondary | ICD-10-CM

## 2023-05-13 LAB — MICROSCOPIC EXAMINATION
Crystal Type: NONE SEEN
Crystals: NONE SEEN
RBC, Urine: >30 /[HPF] — AB (ref 0–2)
Renal Epithel, UA: >10 /[HPF] — AB
WBC, UA: >30 /[HPF] — AB (ref 0–5)

## 2023-05-13 LAB — URINALYSIS, ROUTINE W REFLEX MICROSCOPIC
Bilirubin, UA: NEGATIVE
Glucose, UA: NEGATIVE
Ketones, UA: NEGATIVE
Nitrite, UA: NEGATIVE
Specific Gravity, UA: 1.015 (ref 1.005–1.030)
Urobilinogen, Ur: 0.2 mg/dL (ref 0.2–1.0)
pH, UA: 5.5 (ref 5.0–7.5)

## 2023-05-13 MED ORDER — SULFAMETHOXAZOLE-TRIMETHOPRIM 400-80 MG PO TABS: 1.0000 | ORAL_TABLET | Freq: Every evening | ORAL | 1 refills | Status: DC

## 2023-05-13 MED ORDER — ESTRADIOL 0.1 MG/GM VA CREA: TOPICAL_CREAM | VAGINAL | 12 refills | Status: DC

## 2023-05-13 NOTE — Progress Notes (Signed)
Assessment: 1. Recurrent UTI   2. Nephrolithiasis     Plan: Today I had a long discussion with the patient and her daughter regarding UTI prevention measures.  We discussed basic issues including proper hydration, voiding patterns and hygiene.  We also discussed cranberry supplementation and have also recommended a urinary probiotic with lactobacillus.  In addition patient will continue low-dose suppression for the next few months and we will also begin vaginal estrogen cream. Follow-up 6 months or sooner problems arise  Chief Complaint: Recurrent uti  HPI: Danielle [name] is a 89 y.o. female who presents for continued evaluation of nephrolithiasis.  Patient also has a history of recurrent UTI. See my note 02/13/2023 at the time of initial consultation as an inpatient.  Patient is status post cystoscopy with right retrograde with ureteroscopy and laser lithotripsy and stent placement on 03/17/2023 for an 8mm right proximal stone.  Her daughter removed her stent subsequently via tether. FU renal US 04/2023 showed no residual hydro.  Patient continues to take low-dose Bactrim suppression.  She currently reports doing well.  She has had occasional episodes of pink-tinged urine.  Urinalysis today is nitrate negative but does show 3+ blood and leukocytes.  Will send for culture.  Portions of the above documentation were copied from a prior visit for review purposes only.  Allergies: Allergies  Allergen Reactions   Codeine Swelling   Levofloxacin     Other reaction(s): Malaise (intolerance)   Penicillins Swelling    Has patient had a PCN reaction causing immediate rash, facial/tongue/throat swelling, SOB or lightheadedness with hypotension:YES Has patient had a PCN reaction causing severe rash involving mucus membranes or skin necrosis: NO Has patient had a PCN reaction that required hospitalization NO Has patient had a PCN reaction occurring within the last 10 years: NO If all of  the above answers are "NO", then may proceed with Cephalosporin use.   Xarelto [Rivaroxaban]     Causes bleeding in stool and urine   Tape Rash    PMH: Past Medical History:  Diagnosis Date   Anginal pain (HCC)    Arthritis    Asthma    At high risk for falls 11/11/2017   CHF (congestive heart failure) (HCC)    Chronic diastolic congestive heart failure (HCC) 11/11/2015   Chronic insomnia 10/24/2015   Chronic pain of both knees 05/14/2018   Chronic respiratory failure with hypoxia (HCC) 05/05/2017   CKD (chronic kidney disease) stage 3, GFR 30-59 ml/min (HCC) 05/07/2017   COPD (chronic obstructive pulmonary disease) (HCC)    Coronary artery disease    Coronary artery disease involving native coronary artery of native heart without angina pectoris 11/14/2014   Overview:  40% RCA a cardiac catheter in 2014   Current mild episode of major depressive disorder (HCC) 11/14/2014   Depression    DOE (dyspnea on exertion) 09/07/2018   Dysphagia 08/12/2018   Edema 08/12/2018   Elevated blood sugar 11/09/2015   Emphysema lung (HCC)    Flat feet, bilateral 05/30/2020   Gastroesophageal reflux disease without esophagitis 11/11/2017   GERD (gastroesophageal reflux disease)    Headache    History of hiatal hernia    History of kidney stones    Hyperlipidemia    Hypertension    Insomnia    Internal hemorrhoids 11/26/2018   Iron deficiency anemia 08/12/2018   Malaise and fatigue 08/14/2015   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/26/2018   Mesenteric mass 02/19/2015   Midsternal chest pain 11/11/2015   Mixed hyperlipidemia  11/14/2014   Morbid obesity (HCC) 09/07/2018   OSA (obstructive sleep apnea) 12/16/2019   Osteoporosis    Panlobular emphysema (HCC) 11/09/2015   Pneumonia 2013   PONV (postoperative nausea and vomiting)    Positive D-dimer 09/08/2018   Primary osteoarthritis involving multiple joints 08/14/2015   Restless legs syndrome 03/04/2018   Senile purpura (HCC) 08/12/2018   Shortness of  breath dyspnea    with exertion   Stasis edema of left lower extremity 04/13/2018   Strain of right shoulder 08/12/2018   Type 2 diabetes mellitus with stage 3 chronic kidney disease (HCC) 08/14/2015   Unstable angina (HCC) 10/30/2018   Vitamin B12 deficiency 04/13/2018   Vitamin D deficiency 02/27/2016    PSH: Past Surgical History:  Procedure Laterality Date   ABDOMINAL HYSTERECTOMY     APPENDECTOMY     BREAST SURGERY  40 yrs. ago   growth removed in each breast-benign   CARDIAC CATHETERIZATION     12/14/12 LHC ([facility]): few mild stenotic areas max 40% of ectatic RCA o/w NL coronaries, EF 65%. Med tx.   CATARACT EXTRACTION W/ INTRAOCULAR LENS  IMPLANT, BILATERAL Bilateral 15 yrs ago   CHOLECYSTECTOMY     COLONOSCOPY  04/22/2013   Diverticulosis in the sigmoid colon. Non bleeding internal hemorrhoids. Normal mucosa vascular pattern in the entire examined colon. Three 6 mm polyps n the descending colon. Resected and retrieved.    CYSTOSCOPY WITH RETROGRADE PYELOGRAM, URETEROSCOPY AND STENT PLACEMENT Right 02/14/2023   Procedure: CYSTOSCOPY WITH RETROGRADE PYELOGRAM, URETEROSCOPY AND STENT PLACEMENT;  Surgeon: Joline Maxcy, MD;  Location: [facility] OR;  Service: Urology;  Laterality: Right;   CYSTOSCOPY WITH RETROGRADE PYELOGRAM, URETEROSCOPY AND STENT PLACEMENT Right 03/17/2023   Procedure: CYSTOSCOPY WITH RETROGRADE PYELOGRAM, URETEROSCOPY AND STENT PLACEMENT;  Surgeon: Joline Maxcy, MD;  Location: [facility] ORS;  Service: Urology;  Laterality: Right;   FOOT SURGERY     LAPAROSCOPIC APPENDECTOMY N/A 02/19/2015   Procedure: EXCISION OF MESENTERIC MASS;  Surgeon: Almond Lint, MD;  Location: [facility] OR;  Service: General;  Laterality: N/A;   REPLACEMENT TOTAL KNEE BILATERAL      SH: Social History   Tobacco Use   Smoking status: Former    Current packs/day: 0.00    Types: Cigarettes    Quit date: 08/21/2016    Years since quitting: 6.7   Smokeless tobacco: Never  Vaping Use   Vaping  status: Never Used  Substance Use Topics   Alcohol use: No   Drug use: No    ROS: Constitutional:  Negative for fever, chills, weight loss CV: Negative for chest pain, previous MI, hypertension Respiratory:  Negative for shortness of breath, wheezing, sleep apnea, frequent cough GI:  Negative for nausea, vomiting, bloody stool, GERD  PE: BP 136/78   Pulse 87  GENERAL APPEARANCE:  Well appearing, well developed, well nourished, NAD    Results: Dipstick urine shows nitrate negative.  There is 3+ blood and leukocytes

## 2023-05-15 LAB — URINE CULTURE

## 2023-06-12 ENCOUNTER — Ambulatory Visit (HOSPITAL_BASED_OUTPATIENT_CLINIC_OR_DEPARTMENT_OTHER)
Admission: EM | Admit: 2023-06-12 | Discharge: 2023-06-12 | Disposition: A | Discharge status: Home / Self Care | Attending: Family Medicine | Admitting: Family Medicine

## 2023-06-12 ENCOUNTER — Ambulatory Visit (HOSPITAL_BASED_OUTPATIENT_CLINIC_OR_DEPARTMENT_OTHER)
Admit: 2023-06-12 | Discharge: 2023-06-12 | Disposition: A | Discharge status: Home / Self Care | Attending: Family Medicine | Admitting: Family Medicine

## 2023-06-12 ENCOUNTER — Encounter (HOSPITAL_BASED_OUTPATIENT_CLINIC_OR_DEPARTMENT_OTHER): Payer: Self-pay

## 2023-06-12 DIAGNOSIS — R0602 Shortness of breath: Secondary | ICD-10-CM | POA: Diagnosis not present

## 2023-06-12 DIAGNOSIS — J101 Influenza due to other identified influenza virus with other respiratory manifestations: Secondary | ICD-10-CM

## 2023-06-12 LAB — POC COVID19/FLU A&B COMBO
Covid Antigen, POC: NEGATIVE
Influenza A Antigen, POC: POSITIVE — AB
Influenza B Antigen, POC: NEGATIVE

## 2023-06-12 MED ORDER — IPRATROPIUM-ALBUTEROL 0.5-2.5 (3) MG/3ML IN SOLN
3.0000 mL | Freq: Once | RESPIRATORY_TRACT | Status: AC
  Administered 2023-06-12: 3 mL via RESPIRATORY_TRACT

## 2023-06-12 MED ORDER — ONDANSETRON 4 MG PO TBDP
4.0000 mg | ORAL_TABLET | Freq: Once | ORAL | Status: AC
  Administered 2023-06-12: 4 mg via ORAL

## 2023-06-12 MED ORDER — PREDNISONE 20 MG PO TABS
20.0000 mg | ORAL_TABLET | Freq: Every day | ORAL | 0 refills | Status: DC
Start: 2023-06-12 — End: 2023-06-20

## 2023-06-12 MED ORDER — ONDANSETRON 4 MG PO TBDP: 4.0000 mg | ORAL_TABLET | Freq: Three times a day (TID) | ORAL | 0 refills | Status: DC | PRN

## 2023-06-12 MED ORDER — CEFDINIR 300 MG PO CAPS: 300.0000 mg | ORAL_CAPSULE | Freq: Two times a day (BID) | ORAL | 0 refills | Status: DC

## 2023-06-12 NOTE — ED Triage Notes (Signed)
 Hx of COPD, CHF, afib. SOB onset early this week. Patient has been refusing to go to ER or pcp. Presents in respiratory distress. Usually not on oxygen unless needed. Daughter reports RA O2 sat 80's. Patient on 2L [place] and sats now reading 97%. Patient reporting pain to chest with radiation down right arm. States, "It feels as if I have an elephant sitting on my chest."

## 2023-06-12 NOTE — ED Provider Notes (Signed)
 [facility]    CSN: [id number] Arrival date & time: 06/12/23  1038      History   Chief Complaint Chief Complaint  Patient presents with   Respiratory Distress    HPI Danielle [name] is a 89 y.o. female.   89 year old female with past medical history of CHF, CAD, COPD, chronic respiratory failure on oxygen.  Present today with cough, shortness of breath started earlier this week. Reporting that she bent over last week and feels like she cracked her ribs on the right side.  She is coughing up sputum.  Was exposed to flu and had low-grade fever yesterday.  Having chest pressure and pain radiating down both arms.  Very weak.  Currently satting 97% on 2 L of oxygen.     Past Medical History:  Diagnosis Date   Anginal pain (HCC)    Arthritis    Asthma    At high risk for falls 11/11/2017   CHF (congestive heart failure) (HCC)    Chronic diastolic congestive heart failure (HCC) 11/11/2015   Chronic insomnia 10/24/2015   Chronic pain of both knees 05/14/2018   Chronic respiratory failure with hypoxia (HCC) 05/05/2017   CKD (chronic kidney disease) stage 3, GFR 30-59 ml/min (HCC) 05/07/2017   COPD (chronic obstructive pulmonary disease) (HCC)    Coronary artery disease    Coronary artery disease involving native coronary artery of native heart without angina pectoris 11/14/2014   Overview:  40% RCA a cardiac catheter in 2014   Current mild episode of major depressive disorder (HCC) 11/14/2014   Depression    DOE (dyspnea on exertion) 09/07/2018   Dysphagia 08/12/2018   Edema 08/12/2018   Elevated blood sugar 11/09/2015   Emphysema lung (HCC)    Flat feet, bilateral 05/30/2020   Gastroesophageal reflux disease without esophagitis 11/11/2017   GERD (gastroesophageal reflux disease)    Headache    History of hiatal hernia    History of kidney stones    Hyperlipidemia    Hypertension    Insomnia    Internal hemorrhoids 11/26/2018   Iron deficiency anemia  08/12/2018   Malaise and fatigue 08/14/2015   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/26/2018   Mesenteric mass 02/19/2015   Midsternal chest pain 11/11/2015   Mixed hyperlipidemia 11/14/2014   Morbid obesity (HCC) 09/07/2018   OSA (obstructive sleep apnea) 12/16/2019   Osteoporosis    Panlobular emphysema (HCC) 11/09/2015   Pneumonia 2013   PONV (postoperative nausea and vomiting)    Positive D-dimer 09/08/2018   Primary osteoarthritis involving multiple joints 08/14/2015   Restless legs syndrome 03/04/2018   Senile purpura (HCC) 08/12/2018   Shortness of breath dyspnea    with exertion   Stasis edema of left lower extremity 04/13/2018   Strain of right shoulder 08/12/2018   Type 2 diabetes mellitus with stage 3 chronic kidney disease (HCC) 08/14/2015   Unstable angina (HCC) 10/30/2018   Vitamin B12 deficiency 04/13/2018   Vitamin D deficiency 02/27/2016    Patient Active Problem List   Diagnosis Date Noted   Acute deep vein thrombosis (DVT) of right lower extremity (HCC) 02/19/2023   Nephrolithiasis 02/14/2023   Peripheral polyneuropathy 09/10/2022   Claudication (HCC) 09/10/2022   Subclinical hypothyroidism 03/11/2022   Atrial fibrillation (HCC) 02/28/2022   Paroxysmal atrial fibrillation (HCC) 02/27/2022   Acute on chronic diastolic CHF (congestive heart failure) (HCC) 02/27/2022   Multifocal atrial tachycardia (HCC) 02/26/2022   Coronary artery disease    PONV (postoperative nausea and vomiting)  Osteoporosis    Insomnia    Hypertension    Hyperlipidemia    History of hiatal hernia    Headache    GERD (gastroesophageal reflux disease)    Emphysema lung (HCC)    COPD (chronic obstructive pulmonary disease) (HCC)    CHF (congestive heart failure) (HCC)    Asthma    Arthritis    Anginal pain (HCC)    Callus 05/30/2020   Flat feet, bilateral 05/30/2020   Hammer toe of left foot 05/30/2020   OSA (obstructive sleep apnea) 12/16/2019   Internal hemorrhoids 11/26/2018   Melanosis  coli 11/26/2018   Unstable angina (HCC) 10/30/2018   Positive D-dimer 09/08/2018   DOE (dyspnea on exertion) 09/07/2018   Class 3 obesity 09/07/2018   Edema 08/12/2018   Iron deficiency anemia 08/12/2018   Senile purpura (HCC) 08/12/2018   Strain of right shoulder 08/12/2018   Dysphagia 08/12/2018   Chronic pain of both knees 05/14/2018   Stasis edema of left lower extremity 04/13/2018   Vitamin B12 deficiency 04/13/2018   Restless legs syndrome 03/04/2018   At high risk for falls 11/11/2017   Gastroesophageal reflux disease without esophagitis 11/11/2017   Stage 3a chronic kidney disease (HCC) 05/07/2017   Chronic respiratory failure with hypoxia (HCC) 05/05/2017   Vitamin D deficiency 02/27/2016   Nocturnal hypoxemia due to emphysema (HCC) 12/04/2015   Midsternal chest pain 11/11/2015   Chronic diastolic congestive heart failure (HCC) 11/11/2015   Chest pain 11/09/2015   Depression 11/09/2015   Panlobular emphysema (HCC) 11/09/2015   Elevated blood sugar 11/09/2015   Chronic insomnia 10/24/2015   Type 2 diabetes mellitus with hyperlipidemia (HCC) 08/14/2015   Primary osteoarthritis involving multiple joints 08/14/2015   Malaise and fatigue 08/14/2015   Degenerative lumbar disc 08/14/2015   Abnormal gait 08/14/2015   Mesenteric mass 02/19/2015   Coronary artery disease involving native coronary artery of native heart without angina pectoris 11/14/2014   Mixed hyperlipidemia 11/14/2014   Current mild episode of major depressive disorder (HCC) 11/14/2014   Pneumonia 2013    Past Surgical History:  Procedure Laterality Date   ABDOMINAL HYSTERECTOMY     APPENDECTOMY     BREAST SURGERY  40 yrs. ago   growth removed in each breast-benign   CARDIAC CATHETERIZATION     12/14/12 LHC ([facility]): few mild stenotic areas max 40% of ectatic RCA o/w NL coronaries, EF 65%. Med tx.   CATARACT EXTRACTION W/ INTRAOCULAR LENS  IMPLANT, BILATERAL Bilateral 15 yrs ago   CHOLECYSTECTOMY      COLONOSCOPY  04/22/2013   Diverticulosis in the sigmoid colon. Non bleeding internal hemorrhoids. Normal mucosa vascular pattern in the entire examined colon. Three 6 mm polyps n the descending colon. Resected and retrieved.    CYSTOSCOPY WITH RETROGRADE PYELOGRAM, URETEROSCOPY AND STENT PLACEMENT Right 02/14/2023   Procedure: CYSTOSCOPY WITH RETROGRADE PYELOGRAM, URETEROSCOPY AND STENT PLACEMENT;  Surgeon: Joline Maxcy, MD;  Location: [facility] OR;  Service: Urology;  Laterality: Right;   CYSTOSCOPY WITH RETROGRADE PYELOGRAM, URETEROSCOPY AND STENT PLACEMENT Right 03/17/2023   Procedure: CYSTOSCOPY WITH RETROGRADE PYELOGRAM, URETEROSCOPY AND STENT PLACEMENT;  Surgeon: Joline Maxcy, MD;  Location: [facility] ORS;  Service: Urology;  Laterality: Right;   FOOT SURGERY     LAPAROSCOPIC APPENDECTOMY N/A 02/19/2015   Procedure: EXCISION OF MESENTERIC MASS;  Surgeon: Almond Lint, MD;  Location: [facility] OR;  Service: General;  Laterality: N/A;   REPLACEMENT TOTAL KNEE BILATERAL      OB History   No obstetric  history on file.      Home Medications    Prior to Admission medications   Medication Sig Start Date End Date Taking? Authorizing Provider  cefdinir (OMNICEF) 300 MG capsule Take 1 capsule (300 mg total) by mouth 2 (two) times daily for 5 days. 06/12/23 06/17/23 Yes Nathan Moctezuma A, FNP  ondansetron (ZOFRAN-ODT) 4 MG disintegrating tablet Take 1 tablet (4 mg total) by mouth every 8 (eight) hours as needed for nausea or vomiting. 06/12/23  Yes Alaa Eyerman A, FNP  predniSONE (DELTASONE) 20 MG tablet Take 1 tablet (20 mg total) by mouth daily with breakfast for 5 days. 06/12/23 06/17/23 Yes Zyshawn Bohnenkamp A, FNP  acetaminophen (TYLENOL) 500 MG tablet Take 500 mg by mouth as needed for mild pain (pain score 1-3) or headache.    [provider]  albuterol (PROVENTIL HFA;VENTOLIN HFA) 108 (90 BASE) MCG/ACT inhaler Inhale 2 puffs into the lungs every 6 (six) hours as needed for wheezing or shortness of breath.      [provider]  amiodarone (PACERONE) 200 MG tablet Take 1 tablet (200 mg total) by mouth daily. 03/05/23   Georgeanna Lea, MD  apixaban Everlene Balls) 5 MG TABS tablet Take 1 tablet by mouth twice daily 03/10/23   Georgeanna Lea, MD  budesonide-formoterol [facility]) 160-4.5 MCG/ACT inhaler Inhale 2 puffs into the lungs 2 (two) times daily.    [provider]  carboxymethylcellulose (REFRESH PLUS) 0.5 % SOLN Place 1 drop into both eyes daily as needed (dry eyes).    [provider]  citalopram (CELEXA) 20 MG tablet Take 20 mg by mouth daily. 10/12/16   [provider]  clonazePAM (KLONOPIN) 1 MG tablet Take 1 mg by mouth at bedtime. 11/20/14   [provider]  diclofenac Sodium (VOLTAREN) 1 % GEL APPLY 2 GRAMS TOPICALLY TO AFFECTED AREA THREE TIMES DAILY Patient taking differently: Apply 2 g topically at bedtime. 08/08/19   Nadara Mustard, MD  estradiol (ESTRACE) 0.1 MG/GM vaginal cream Apply 3 times weekly using a pea-sized amount on fingertip as directed 05/13/23   Joline Maxcy, MD  ferrous sulfate 325 (65 FE) MG EC tablet Take 325 mg by mouth daily with breakfast.    [provider]  furosemide (LASIX) 40 MG tablet Take 1 tablet (40 mg total) by mouth daily. Patient taking differently: Take 20-40 mg by mouth See admin instructions. Take 40 mg by mouth daily, may take an additional 20 mg if needed for swelling or fluid 02/20/23   Jonah Blue, MD  gabapentin (NEURONTIN) 100 MG capsule Take 100 mg by mouth at bedtime.    [provider]  glimepiride (AMARYL) 4 MG tablet Take 4 mg by mouth daily.    [provider]  loratadine (CLARITIN) 10 MG tablet Take 10 mg by mouth daily.    [provider]  metFORMIN (GLUCOPHAGE) 500 MG tablet Take 500 mg by mouth See admin instructions. Take 500 mg by the mouth in the morning, may take an additional 500 mg in the evening if blood sugar is above 200.    [provider]  Multiple Vitamin (MULTIVITAMIN) capsule Take 1 capsule by mouth daily.    [provider]  nitroGLYCERIN (NITROSTAT) 0.4 MG SL tablet Place 1 tablet (0.4 mg total) under the tongue every 5 (five) minutes as needed for chest pain. 08/22/20 08/21/27  Georgeanna Lea, MD  nystatin (MYCOSTATIN/NYSTOP) powder Apply 1 Application topically 2 (two) times daily as needed (yeast).  07/31/22   [provider]  pantoprazole (PROTONIX) 40 MG tablet Take 40 mg by mouth 2 (two) times daily. 11/14/16   [provider]  potassium chloride (MICRO-K) 10 MEQ CR capsule Take 10 mEq by mouth daily. 11/20/14   [provider]  rOPINIRole (REQUIP) 0.25 MG tablet Take 0.25 mg by mouth at bedtime.    [provider]  rosuvastatin (CRESTOR) 40 MG tablet Take 40 mg by mouth daily.    [provider]  senna (SENOKOT) 8.6 MG TABS tablet Take 2 tablets by mouth at bedtime.    [provider]  sitaGLIPtin (JANUVIA) 100 MG tablet Take 100 mg by mouth daily. 03/11/22   [provider]  spironolactone (ALDACTONE) 25 MG tablet Take 1/2 (one-half) tablet by mouth once daily 03/20/23   Georgeanna Lea, MD  sulfamethoxazole-trimethoprim (BACTRIM) 400-80 MG tablet Take 1 tablet by mouth at bedtime. 05/13/23   Joline Maxcy, MD  Vitamin D, Ergocalciferol, (DRISDOL) 1.25 MG (50000 UNIT) CAPS capsule Take 50,000 Units by mouth every Sunday. 02/10/23   [provider]    Family History Family History  Problem Relation Age of Onset   Heart attack Mother     Social History Social History   Tobacco Use   Smoking status: Former    Current packs/day: 0.00    Types: Cigarettes    Quit date: 08/21/2016    Years since quitting: 6.8   Smokeless tobacco: Never  Vaping Use   Vaping status: Never Used  Substance Use Topics   Alcohol use: No   Drug use: No     Allergies   Codeine, Levofloxacin, Penicillins, Xarelto [rivaroxaban], and  Tape   Review of Systems Review of Systems  HENT:  Positive for congestion.   Respiratory:  Positive for cough, chest tightness and shortness of breath.   Neurological:  Positive for weakness.     Physical Exam Triage Vital Signs ED Triage Vitals [06/12/23 1054]  Encounter Vitals Group     BP (!) 152/74     Systolic BP Percentile      Diastolic BP Percentile      Pulse Rate 62     Resp (!) 24     Temp 98.4 F (36.9 C)     Temp Source Oral     SpO2 97 %     Weight      Height      Head Circumference      Peak Flow      Pain Score 10     Pain Loc      Pain Education      Exclude from Growth Chart    No data found.  Updated Vital Signs BP (!) 152/74 (BP Location: Right Arm)   Pulse 62   Temp 98.4 F (36.9 C) (Oral)   Resp (!) 24   SpO2 97%   Visual Acuity Right Eye Distance:   Left Eye Distance:   Bilateral Distance:    Right Eye Near:   Left Eye Near:    Bilateral Near:     Physical Exam HENT:     Head: Normocephalic and atraumatic.  Eyes:     Conjunctiva/sclera: Conjunctivae normal.  Cardiovascular:     Rate and Rhythm: Normal rate and regular rhythm.     Heart sounds: Normal heart sounds.  Pulmonary:     Effort: Respiratory distress present.     Breath sounds: Wheezing and rhonchi present.  Musculoskeletal:     Right  lower leg: Edema present.     Left lower leg: Edema present.     Comments: Chronic edema   Skin:    General: Skin is warm and dry.  Neurological:     General: No focal deficit present.     Mental Status: She is alert.  Psychiatric:        Mood and Affect: Mood normal.      UC Treatments / Results  Labs (all labs ordered are listed, but only abnormal results are displayed) Labs Reviewed  POC COVID19/FLU A&B COMBO - Abnormal; Notable for the following components:      Result Value   Influenza A Antigen, POC Positive (*)    All other components within normal limits    EKG   Radiology DG Chest 2 View Result Date:  06/12/2023 CLINICAL DATA:  Shortness of breath EXAM: CHEST - 2 VIEW COMPARISON:  02/13/2023 FINDINGS: Under penetrated radiographs. Eventration left hemidiaphragm. No consolidation, pneumothorax or effusion. No edema. Normal cardiopericardial silhouette. Calcified aorta. Films are under penetrated. Degenerative changes along the spine. IMPRESSION: No acute cardiopulmonary disease. Electronically Signed   By: Karen Kays M.D.   On: 06/12/2023 13:48    Procedures Procedures (including critical care time)  Medications Ordered in UC Medications  ipratropium-albuterol (DUONEB) 0.5-2.5 (3) MG/3ML nebulizer solution 3 mL (3 mLs Nebulization Given 06/12/23 1147)  ondansetron (ZOFRAN-ODT) disintegrating tablet 4 mg (4 mg Oral Given 06/12/23 1254)    Initial Impression / Assessment and Plan / UC Course  I have reviewed the triage vital signs and the nursing notes.  Pertinent labs & imaging results that were available during my care of the patient were reviewed by me and considered in my medical decision making (see chart for details).     Shortness of breath-patient presents today with cough, shortness of breath, chest tightness. Patient on home O2 due to COPD. Patient given nebulizer treatment here with some relief in symptoms. X-ray normal Flu test positive.  Concerned of potential pneumonia we will go ahead and treat with antibiotics.  Also treating with prednisone daily for 5 days for inflammation in  the lungs.  Also recommend using nebulizer treatment as needed for shortness of breath Zofran for nausea, vomiting as needed. For any worsening symptoms go to the ER Final Clinical Impressions(s) / UC Diagnoses   Final diagnoses:  SOB (shortness of breath)  Influenza A     Discharge Instructions      Your flu test was positive.  I am concerned due to your lungs that you may have pneumonia.  We are treating you for pneumonia.  I am also putting you on a short round of steroids to help with  inflammation in your lungs.  You can do albuterol as needed at home. Tylenol for pain and I have prescribed Zofran for nausea, vomiting.  If your symptoms worsen you will need to go to the ER.     ED Prescriptions     Medication Sig Dispense Auth. Provider   predniSONE (DELTASONE) 20 MG tablet Take 1 tablet (20 mg total) by mouth daily with breakfast for 5 days. 5 tablet Khylen Riolo A, FNP   ondansetron (ZOFRAN-ODT) 4 MG disintegrating tablet Take 1 tablet (4 mg total) by mouth every 8 (eight) hours as needed for nausea or vomiting. 20 tablet Lindey Renzulli A, FNP   cefdinir (OMNICEF) 300 MG capsule Take 1 capsule (300 mg total) by mouth 2 (two) times daily for 5 days. 10 capsule Parys Elenbaas A,  FNP      PDMP not reviewed this encounter.   Janace Aris, FNP 06/12/23 1455

## 2023-06-12 NOTE — Discharge Instructions (Signed)
 Your flu test was positive.  I am concerned due to your lungs that you may have pneumonia.  We are treating you for pneumonia.  I am also putting you on a short round of steroids to help with inflammation in your lungs.  You can do albuterol as needed at home. Tylenol for pain and I have prescribed Zofran for nausea, vomiting.  If your symptoms worsen you will need to go to the ER.

## 2023-06-13 ENCOUNTER — Encounter (HOSPITAL_COMMUNITY): Payer: Self-pay | Admitting: Emergency Medicine

## 2023-06-13 ENCOUNTER — Other Ambulatory Visit: Payer: Self-pay

## 2023-06-13 ENCOUNTER — Emergency Department (HOSPITAL_COMMUNITY)

## 2023-06-13 ENCOUNTER — Inpatient Hospital Stay (HOSPITAL_COMMUNITY)

## 2023-06-13 ENCOUNTER — Inpatient Hospital Stay (HOSPITAL_COMMUNITY)
Admission: EM | Admit: 2023-06-13 | Discharge: 2023-06-20 | DRG: 193 | Disposition: A | Discharge status: Skilled Nursing Facility | Attending: Family Medicine | Admitting: Family Medicine

## 2023-06-13 DIAGNOSIS — N644 Mastodynia: Secondary | ICD-10-CM | POA: Insufficient documentation

## 2023-06-13 DIAGNOSIS — R7401 Elevation of levels of liver transaminase levels: Secondary | ICD-10-CM | POA: Diagnosis not present

## 2023-06-13 DIAGNOSIS — K219 Gastro-esophageal reflux disease without esophagitis: Secondary | ICD-10-CM | POA: Diagnosis present

## 2023-06-13 DIAGNOSIS — E1122 Type 2 diabetes mellitus with diabetic chronic kidney disease: Secondary | ICD-10-CM | POA: Diagnosis present

## 2023-06-13 DIAGNOSIS — J431 Panlobular emphysema: Secondary | ICD-10-CM | POA: Diagnosis present

## 2023-06-13 DIAGNOSIS — Z23 Encounter for immunization: Secondary | ICD-10-CM

## 2023-06-13 DIAGNOSIS — Z7951 Long term (current) use of inhaled steroids: Secondary | ICD-10-CM | POA: Diagnosis not present

## 2023-06-13 DIAGNOSIS — E782 Mixed hyperlipidemia: Secondary | ICD-10-CM | POA: Diagnosis present

## 2023-06-13 DIAGNOSIS — J449 Chronic obstructive pulmonary disease, unspecified: Secondary | ICD-10-CM | POA: Diagnosis present

## 2023-06-13 DIAGNOSIS — Z66 Do not resuscitate: Secondary | ICD-10-CM | POA: Diagnosis present

## 2023-06-13 DIAGNOSIS — I5032 Chronic diastolic (congestive) heart failure: Secondary | ICD-10-CM | POA: Diagnosis present

## 2023-06-13 DIAGNOSIS — Z8249 Family history of ischemic heart disease and other diseases of the circulatory system: Secondary | ICD-10-CM

## 2023-06-13 DIAGNOSIS — Z9981 Dependence on supplemental oxygen: Secondary | ICD-10-CM

## 2023-06-13 DIAGNOSIS — Z1152 Encounter for screening for COVID-19: Secondary | ICD-10-CM | POA: Diagnosis not present

## 2023-06-13 DIAGNOSIS — N029 Recurrent and persistent hematuria with unspecified morphologic changes: Secondary | ICD-10-CM

## 2023-06-13 DIAGNOSIS — E1140 Type 2 diabetes mellitus with diabetic neuropathy, unspecified: Secondary | ICD-10-CM | POA: Diagnosis present

## 2023-06-13 DIAGNOSIS — Z789 Other specified health status: Secondary | ICD-10-CM

## 2023-06-13 DIAGNOSIS — Z7984 Long term (current) use of oral hypoglycemic drugs: Secondary | ICD-10-CM

## 2023-06-13 DIAGNOSIS — I130 Hypertensive heart and chronic kidney disease with heart failure and stage 1 through stage 4 chronic kidney disease, or unspecified chronic kidney disease: Secondary | ICD-10-CM | POA: Diagnosis present

## 2023-06-13 DIAGNOSIS — W19XXXA Unspecified fall, initial encounter: Principal | ICD-10-CM

## 2023-06-13 DIAGNOSIS — J101 Influenza due to other identified influenza virus with other respiratory manifestations: Principal | ICD-10-CM | POA: Diagnosis present

## 2023-06-13 DIAGNOSIS — Z91048 Other nonmedicinal substance allergy status: Secondary | ICD-10-CM

## 2023-06-13 DIAGNOSIS — J111 Influenza due to unidentified influenza virus with other respiratory manifestations: Secondary | ICD-10-CM

## 2023-06-13 DIAGNOSIS — R0600 Dyspnea, unspecified: Secondary | ICD-10-CM

## 2023-06-13 DIAGNOSIS — E871 Hypo-osmolality and hyponatremia: Secondary | ICD-10-CM | POA: Diagnosis present

## 2023-06-13 DIAGNOSIS — K649 Unspecified hemorrhoids: Secondary | ICD-10-CM | POA: Diagnosis not present

## 2023-06-13 DIAGNOSIS — Y92009 Unspecified place in unspecified non-institutional (private) residence as the place of occurrence of the external cause: Secondary | ICD-10-CM

## 2023-06-13 DIAGNOSIS — W010XXA Fall on same level from slipping, tripping and stumbling without subsequent striking against object, initial encounter: Secondary | ICD-10-CM | POA: Diagnosis present

## 2023-06-13 DIAGNOSIS — I493 Ventricular premature depolarization: Secondary | ICD-10-CM | POA: Diagnosis not present

## 2023-06-13 DIAGNOSIS — I5030 Unspecified diastolic (congestive) heart failure: Secondary | ICD-10-CM | POA: Insufficient documentation

## 2023-06-13 DIAGNOSIS — Z8701 Personal history of pneumonia (recurrent): Secondary | ICD-10-CM

## 2023-06-13 DIAGNOSIS — E119 Type 2 diabetes mellitus without complications: Secondary | ICD-10-CM

## 2023-06-13 DIAGNOSIS — B159 Hepatitis A without hepatic coma: Secondary | ICD-10-CM | POA: Insufficient documentation

## 2023-06-13 DIAGNOSIS — G2581 Restless legs syndrome: Secondary | ICD-10-CM | POA: Diagnosis present

## 2023-06-13 DIAGNOSIS — L304 Erythema intertrigo: Secondary | ICD-10-CM | POA: Diagnosis present

## 2023-06-13 DIAGNOSIS — I4891 Unspecified atrial fibrillation: Secondary | ICD-10-CM | POA: Diagnosis present

## 2023-06-13 DIAGNOSIS — K5730 Diverticulosis of large intestine without perforation or abscess without bleeding: Secondary | ICD-10-CM | POA: Diagnosis not present

## 2023-06-13 DIAGNOSIS — Z9842 Cataract extraction status, left eye: Secondary | ICD-10-CM

## 2023-06-13 DIAGNOSIS — Z96653 Presence of artificial knee joint, bilateral: Secondary | ICD-10-CM | POA: Diagnosis present

## 2023-06-13 DIAGNOSIS — R7989 Other specified abnormal findings of blood chemistry: Secondary | ICD-10-CM

## 2023-06-13 DIAGNOSIS — J9811 Atelectasis: Secondary | ICD-10-CM | POA: Diagnosis present

## 2023-06-13 DIAGNOSIS — Z7901 Long term (current) use of anticoagulants: Secondary | ICD-10-CM

## 2023-06-13 DIAGNOSIS — J9621 Acute and chronic respiratory failure with hypoxia: Secondary | ICD-10-CM

## 2023-06-13 DIAGNOSIS — N1831 Chronic kidney disease, stage 3a: Secondary | ICD-10-CM | POA: Diagnosis present

## 2023-06-13 DIAGNOSIS — R310 Gross hematuria: Secondary | ICD-10-CM | POA: Diagnosis present

## 2023-06-13 DIAGNOSIS — K625 Hemorrhage of anus and rectum: Secondary | ICD-10-CM | POA: Diagnosis not present

## 2023-06-13 DIAGNOSIS — F5104 Psychophysiologic insomnia: Secondary | ICD-10-CM | POA: Diagnosis present

## 2023-06-13 DIAGNOSIS — Z961 Presence of intraocular lens: Secondary | ICD-10-CM | POA: Diagnosis present

## 2023-06-13 DIAGNOSIS — R339 Retention of urine, unspecified: Secondary | ICD-10-CM | POA: Diagnosis not present

## 2023-06-13 DIAGNOSIS — Z86718 Personal history of other venous thrombosis and embolism: Secondary | ICD-10-CM

## 2023-06-13 DIAGNOSIS — Z880 Allergy status to penicillin: Secondary | ICD-10-CM

## 2023-06-13 DIAGNOSIS — I2510 Atherosclerotic heart disease of native coronary artery without angina pectoris: Secondary | ICD-10-CM | POA: Diagnosis present

## 2023-06-13 DIAGNOSIS — Z87440 Personal history of urinary (tract) infections: Secondary | ICD-10-CM

## 2023-06-13 DIAGNOSIS — Z9841 Cataract extraction status, right eye: Secondary | ICD-10-CM

## 2023-06-13 DIAGNOSIS — Z888 Allergy status to other drugs, medicaments and biological substances status: Secondary | ICD-10-CM

## 2023-06-13 DIAGNOSIS — R400 Somnolence: Secondary | ICD-10-CM

## 2023-06-13 DIAGNOSIS — G4733 Obstructive sleep apnea (adult) (pediatric): Secondary | ICD-10-CM | POA: Diagnosis present

## 2023-06-13 DIAGNOSIS — Z9181 History of falling: Secondary | ICD-10-CM

## 2023-06-13 DIAGNOSIS — Z87891 Personal history of nicotine dependence: Secondary | ICD-10-CM

## 2023-06-13 DIAGNOSIS — K5900 Constipation, unspecified: Secondary | ICD-10-CM | POA: Diagnosis present

## 2023-06-13 DIAGNOSIS — D62 Acute posthemorrhagic anemia: Secondary | ICD-10-CM | POA: Diagnosis not present

## 2023-06-13 DIAGNOSIS — Z79899 Other long term (current) drug therapy: Secondary | ICD-10-CM | POA: Diagnosis not present

## 2023-06-13 DIAGNOSIS — Z885 Allergy status to narcotic agent status: Secondary | ICD-10-CM

## 2023-06-13 LAB — BRAIN NATRIURETIC PEPTIDE: B Natriuretic Peptide: 120.6 pg/mL — ABNORMAL HIGH (ref 0.0–100.0)

## 2023-06-13 LAB — URINALYSIS, W/ REFLEX TO CULTURE (INFECTION SUSPECTED)
Bacteria, UA: NONE SEEN
Bilirubin Urine: NEGATIVE
Glucose, UA: 150 mg/dL — AB
Ketones, ur: NEGATIVE mg/dL
Nitrite: NEGATIVE
Protein, ur: 100 mg/dL — AB
RBC / HPF: >50 RBC/hpf (ref 0–5)
Specific Gravity, Urine: 1.019 (ref 1.005–1.030)
pH: 5 (ref 5.0–8.0)

## 2023-06-13 LAB — I-STAT CHEM 8, ED
BUN: 38 mg/dL — ABNORMAL HIGH (ref 8–23)
Calcium, Ion: 0.96 mmol/L — ABNORMAL LOW (ref 1.15–1.40)
Chloride: 107 mmol/L (ref 98–111)
Creatinine, Ser: 1.1 mg/dL — ABNORMAL HIGH (ref 0.44–1.00)
Glucose, Bld: 253 mg/dL — ABNORMAL HIGH (ref 70–99)
HCT: 34 % — ABNORMAL LOW (ref 36.0–46.0)
Hemoglobin: 11.6 g/dL — ABNORMAL LOW (ref 12.0–15.0)
Potassium: 5 mmol/L (ref 3.5–5.1)
Sodium: 134 mmol/L — ABNORMAL LOW (ref 135–145)
TCO2: 24 mmol/L (ref 22–32)

## 2023-06-13 LAB — TROPONIN I (HIGH SENSITIVITY)
Troponin I (High Sensitivity): 64 ng/L — ABNORMAL HIGH (ref <18)
Troponin I (High Sensitivity): 343 ng/L (ref <18)
Troponin I (High Sensitivity): 251 ng/L (ref <18)
Troponin I (High Sensitivity): 286 ng/L (ref <18)

## 2023-06-13 LAB — PROCALCITONIN: Procalcitonin: <0.1 ng/mL

## 2023-06-13 LAB — BLOOD GAS, VENOUS
Acid-Base Excess: 0.4 mmol/L (ref 0.0–2.0)
Acid-Base Excess: 1.2 mmol/L (ref 0.0–2.0)
Bicarbonate: 29.4 mmol/L — ABNORMAL HIGH (ref 20.0–28.0)
Bicarbonate: 29 mmol/L — ABNORMAL HIGH (ref 20.0–28.0)
O2 Saturation: 40 %
O2 Saturation: 60.5 %
Patient temperature: 37
Patient temperature: 36.7
pCO2, Ven: 67 mmHg — ABNORMAL HIGH (ref 44–60)
pCO2, Ven: 58 mmHg (ref 44–60)
pH, Ven: 7.25 (ref 7.25–7.43)
pH, Ven: 7.3 (ref 7.25–7.43)
pO2, Ven: <31 mmHg — CL (ref 32–45)
pO2, Ven: 34 mmHg (ref 32–45)

## 2023-06-13 LAB — CBC WITH DIFFERENTIAL/PLATELET
Abs Immature Granulocytes: 0.1 10*3/uL — ABNORMAL HIGH (ref 0.00–0.07)
Basophils Absolute: 0 10*3/uL (ref 0.0–0.1)
Basophils Relative: 1 %
Eosinophils Absolute: 0.1 10*3/uL (ref 0.0–0.5)
Eosinophils Relative: 2 %
HCT: 34.9 % — ABNORMAL LOW (ref 36.0–46.0)
Hemoglobin: 10.9 g/dL — ABNORMAL LOW (ref 12.0–15.0)
Immature Granulocytes: 2 %
Lymphocytes Relative: 41 %
Lymphs Abs: 2.8 10*3/uL (ref 0.7–4.0)
MCH: 30.3 pg (ref 26.0–34.0)
MCHC: 31.2 g/dL (ref 30.0–36.0)
MCV: 96.9 fL (ref 80.0–100.0)
Monocytes Absolute: 0.6 10*3/uL (ref 0.1–1.0)
Monocytes Relative: 9 %
Neutro Abs: 3.1 10*3/uL (ref 1.7–7.7)
Neutrophils Relative %: 45 %
Platelets: 165 10*3/uL (ref 150–400)
RBC: 3.6 MIL/uL — ABNORMAL LOW (ref 3.87–5.11)
RDW: 16.2 % — ABNORMAL HIGH (ref 11.5–15.5)
WBC: 6.7 10*3/uL (ref 4.0–10.5)
nRBC: 0 % (ref 0.0–0.2)

## 2023-06-13 LAB — I-STAT VENOUS BLOOD GAS, ED
Acid-Base Excess: 0 mmol/L (ref 0.0–2.0)
Bicarbonate: 23.3 mmol/L (ref 20.0–28.0)
Calcium, Ion: 0.98 mmol/L — ABNORMAL LOW (ref 1.15–1.40)
HCT: 33 % — ABNORMAL LOW (ref 36.0–46.0)
Hemoglobin: 11.2 g/dL — ABNORMAL LOW (ref 12.0–15.0)
O2 Saturation: 99 %
Potassium: 5.1 mmol/L (ref 3.5–5.1)
Sodium: 134 mmol/L — ABNORMAL LOW (ref 135–145)
TCO2: 24 mmol/L (ref 22–32)
pCO2, Ven: 31.6 mmHg — ABNORMAL LOW (ref 44–60)
pH, Ven: 7.476 — ABNORMAL HIGH (ref 7.25–7.43)
pO2, Ven: 111 mmHg — ABNORMAL HIGH (ref 32–45)

## 2023-06-13 LAB — RESP PANEL BY RT-PCR (RSV, FLU A&B, COVID)  RVPGX2
Influenza A by PCR: POSITIVE — AB
Influenza B by PCR: NEGATIVE
Resp Syncytial Virus by PCR: NEGATIVE
SARS Coronavirus 2 by RT PCR: NEGATIVE

## 2023-06-13 LAB — I-STAT CG4 LACTIC ACID, ED: Lactic Acid, Venous: 0.9 mmol/L (ref 0.5–1.9)

## 2023-06-13 LAB — COMPREHENSIVE METABOLIC PANEL
ALT: 52 U/L — ABNORMAL HIGH (ref 0–44)
AST: 66 U/L — ABNORMAL HIGH (ref 15–41)
Albumin: 3.3 g/dL — ABNORMAL LOW (ref 3.5–5.0)
Alkaline Phosphatase: 42 U/L (ref 38–126)
Anion gap: 12 (ref 5–15)
BUN: 29 mg/dL — ABNORMAL HIGH (ref 8–23)
CO2: 21 mmol/L — ABNORMAL LOW (ref 22–32)
Calcium: 8.2 mg/dL — ABNORMAL LOW (ref 8.9–10.3)
Chloride: 101 mmol/L (ref 98–111)
Creatinine, Ser: 1.1 mg/dL — ABNORMAL HIGH (ref 0.44–1.00)
GFR, Estimated: 48 mL/min — ABNORMAL LOW (ref >60)
Glucose, Bld: 249 mg/dL — ABNORMAL HIGH (ref 70–99)
Potassium: 4.9 mmol/L (ref 3.5–5.1)
Sodium: 134 mmol/L — ABNORMAL LOW (ref 135–145)
Total Bilirubin: 0.5 mg/dL (ref 0.0–1.2)
Total Protein: 6 g/dL — ABNORMAL LOW (ref 6.5–8.1)

## 2023-06-13 LAB — TYPE AND SCREEN
ABO/RH(D): O POS
Antibody Screen: NEGATIVE

## 2023-06-13 LAB — ETHANOL: Alcohol, Ethyl (B): <10 mg/dL (ref <10)

## 2023-06-13 LAB — GLUCOSE, CAPILLARY
Glucose-Capillary: 256 mg/dL — ABNORMAL HIGH (ref 70–99)
Glucose-Capillary: 222 mg/dL — ABNORMAL HIGH (ref 70–99)

## 2023-06-13 MED ORDER — ACETAMINOPHEN 500 MG PO TABS
1000.0000 mg | ORAL_TABLET | Freq: Once | ORAL | Status: AC
  Administered 2023-06-13: 1000 mg via ORAL
  Filled 2023-06-13: qty 2

## 2023-06-13 MED ORDER — ACETAMINOPHEN 325 MG PO TABS
650.0000 mg | ORAL_TABLET | Freq: Four times a day (QID) | ORAL | Status: DC | PRN
  Administered 2023-06-13 – 2023-06-14 (×2): 650 mg via ORAL
  Filled 2023-06-13 (×2): qty 2

## 2023-06-13 MED ORDER — APIXABAN 5 MG PO TABS: 5.0000 mg | ORAL_TABLET | Freq: Two times a day (BID) | ORAL | Status: DC

## 2023-06-13 MED ORDER — ROSUVASTATIN CALCIUM 20 MG PO TABS
40.0000 mg | ORAL_TABLET | Freq: Every day | ORAL | Status: DC
  Administered 2023-06-13 – 2023-06-20 (×8): 40 mg via ORAL
  Filled 2023-06-13 (×8): qty 2

## 2023-06-13 MED ORDER — INSULIN ASPART 100 UNIT/ML IJ SOLN
0.0000 [IU] | Freq: Three times a day (TID) | INTRAMUSCULAR | Status: DC
  Administered 2023-06-13: 5 [IU] via SUBCUTANEOUS
  Administered 2023-06-14: 1 [IU] via SUBCUTANEOUS
  Administered 2023-06-14: 5 [IU] via SUBCUTANEOUS

## 2023-06-13 MED ORDER — TETANUS-DIPHTH-ACELL PERTUSSIS 5-2.5-18.5 LF-MCG/0.5 IM SUSY
0.5000 mL | PREFILLED_SYRINGE | Freq: Once | INTRAMUSCULAR | Status: AC
  Administered 2023-06-13: 0.5 mL via INTRAMUSCULAR
  Filled 2023-06-13: qty 0.5

## 2023-06-13 MED ORDER — ROPINIROLE HCL 0.25 MG PO TABS
0.2500 mg | ORAL_TABLET | Freq: Every day | ORAL | Status: DC
  Administered 2023-06-13 – 2023-06-19 (×7): 0.25 mg via ORAL
  Filled 2023-06-13 (×8): qty 1

## 2023-06-13 MED ORDER — FUROSEMIDE 10 MG/ML IJ SOLN
80.0000 mg | Freq: Once | INTRAMUSCULAR | Status: AC
Start: 2023-06-13 — End: 2023-06-13
  Administered 2023-06-13: 80 mg via INTRAVENOUS
  Filled 2023-06-13: qty 8

## 2023-06-13 MED ORDER — LIDOCAINE 5 % EX PTCH
1.0000 | MEDICATED_PATCH | CUTANEOUS | Status: DC
  Administered 2023-06-13 – 2023-06-19 (×6): 1 via TRANSDERMAL
  Filled 2023-06-13 (×6): qty 1

## 2023-06-13 MED ORDER — CLONAZEPAM 0.5 MG PO TABS: 1.0000 mg | ORAL_TABLET | Freq: Every day | ORAL | Status: DC

## 2023-06-13 MED ORDER — METHOCARBAMOL 500 MG PO TABS
500.0000 mg | ORAL_TABLET | Freq: Once | ORAL | Status: AC
  Administered 2023-06-13: 500 mg via ORAL
  Filled 2023-06-13: qty 1

## 2023-06-13 MED ORDER — OSELTAMIVIR PHOSPHATE 30 MG PO CAPS
30.0000 mg | ORAL_CAPSULE | Freq: Two times a day (BID) | ORAL | Status: AC
  Administered 2023-06-13 – 2023-06-17 (×9): 30 mg via ORAL
  Filled 2023-06-13 (×9): qty 1

## 2023-06-13 MED ORDER — ACETAMINOPHEN 650 MG RE SUPP: 650.0000 mg | Freq: Four times a day (QID) | RECTAL | Status: DC | PRN

## 2023-06-13 MED ORDER — IPRATROPIUM-ALBUTEROL 0.5-2.5 (3) MG/3ML IN SOLN
3.0000 mL | Freq: Four times a day (QID) | RESPIRATORY_TRACT | Status: DC
  Administered 2023-06-13 – 2023-06-14 (×2): 3 mL via RESPIRATORY_TRACT
  Filled 2023-06-13 (×2): qty 3

## 2023-06-13 MED ORDER — MOMETASONE FURO-FORMOTEROL FUM 200-5 MCG/ACT IN AERO
2.0000 | INHALATION_SPRAY | Freq: Two times a day (BID) | RESPIRATORY_TRACT | Status: DC
  Administered 2023-06-14 – 2023-06-20 (×12): 2 via RESPIRATORY_TRACT
  Filled 2023-06-13: qty 8.8

## 2023-06-13 MED ORDER — LORATADINE 10 MG PO TABS
10.0000 mg | ORAL_TABLET | Freq: Every day | ORAL | Status: DC
  Administered 2023-06-13 – 2023-06-20 (×8): 10 mg via ORAL
  Filled 2023-06-13 (×8): qty 1

## 2023-06-13 MED ORDER — PANTOPRAZOLE SODIUM 40 MG PO TBEC
40.0000 mg | DELAYED_RELEASE_TABLET | Freq: Two times a day (BID) | ORAL | Status: DC
  Administered 2023-06-13 – 2023-06-20 (×14): 40 mg via ORAL
  Filled 2023-06-13 (×14): qty 1

## 2023-06-13 MED ORDER — APIXABAN 5 MG PO TABS
5.0000 mg | ORAL_TABLET | Freq: Two times a day (BID) | ORAL | Status: DC
  Administered 2023-06-13 – 2023-06-14 (×3): 5 mg via ORAL
  Filled 2023-06-13 (×4): qty 1

## 2023-06-13 MED ORDER — SENNA 8.6 MG PO TABS
2.0000 | ORAL_TABLET | Freq: Every day | ORAL | Status: DC
  Administered 2023-06-13 – 2023-06-19 (×7): 17.2 mg via ORAL
  Filled 2023-06-13 (×7): qty 2

## 2023-06-13 MED ORDER — AMIODARONE HCL 200 MG PO TABS
200.0000 mg | ORAL_TABLET | Freq: Every day | ORAL | Status: DC
  Administered 2023-06-13 – 2023-06-20 (×8): 200 mg via ORAL
  Filled 2023-06-13 (×8): qty 1

## 2023-06-13 MED ORDER — SPIRONOLACTONE 12.5 MG HALF TABLET
12.5000 mg | ORAL_TABLET | Freq: Every day | ORAL | Status: DC
  Administered 2023-06-13: 12.5 mg via ORAL
  Filled 2023-06-13 (×2): qty 1

## 2023-06-13 MED ORDER — OSELTAMIVIR PHOSPHATE 75 MG PO CAPS
75.0000 mg | ORAL_CAPSULE | Freq: Once | ORAL | Status: AC
  Administered 2023-06-13: 75 mg via ORAL
  Filled 2023-06-13: qty 1

## 2023-06-13 MED ORDER — FUROSEMIDE 10 MG/ML IJ SOLN: 80.0000 mg | Freq: Once | INTRAMUSCULAR | Status: DC

## 2023-06-13 MED ORDER — GABAPENTIN 100 MG PO CAPS
100.0000 mg | ORAL_CAPSULE | Freq: Every day | ORAL | Status: DC
  Administered 2023-06-13 – 2023-06-18 (×6): 100 mg via ORAL
  Filled 2023-06-13 (×6): qty 1

## 2023-06-13 NOTE — Plan of Care (Signed)
 VBG CO2 67, pH 7.25 Reassessed pt and she is much more alert now, is willing to trial BIPAP Requesting something for right breast pain  - will give Tylenol 650mg  for breast pain, then start BIPAP after about 20 minutes

## 2023-06-13 NOTE — Plan of Care (Signed)
 1600: Patient arrived from ED.  Patient AAOx4, forgetful. NC2L in place. Telemetry monitoring and continuous O2 in progress. Complains of pain to right breast, lidocaine patch placed and ice given. fluid restriction. Skin tear to right knee and left posterior thigh, measured and dressings placed. Safety precautions maintained.   Problem: Coping: Goal: Ability to adjust to condition or change in health will improve Outcome: Progressing   Problem: Fluid Volume: Goal: Ability to maintain a balanced intake and output will improve Outcome: Progressing   Problem: Skin Integrity: Goal: Risk for impaired skin integrity will decrease Outcome: Progressing   Problem: Education: Goal: Knowledge of General Education information will improve Description: Including pain rating scale, medication(s)/side effects and non-pharmacologic comfort measures Outcome: Progressing   Problem: Health Behavior/Discharge Planning: Goal: Ability to manage health-related needs will improve Outcome: Progressing

## 2023-06-13 NOTE — Progress Notes (Signed)
 Orthopedic Tech Progress Note Patient Details:  Danielle [name] 08/27/1933 [id number]  Level II trauma, not needed at this time  Patient ID: Danielle [name], female   DOB: 06-Oct-1933, 89 y.o.   MRN: [id number]  Docia Furl 06/13/2023, 8:23 AM

## 2023-06-13 NOTE — Assessment & Plan Note (Signed)
 Home regimen includes glimepiride 4 mg daily, metformin 500 mg daily, and Januvia 100 mg daily.  Holding home medications while admitted, will need this reconciled prior to discharge. -Sensitive SSI - CBGs for meals and at bedtime - AM A1c

## 2023-06-13 NOTE — ED Notes (Signed)
 CBG 234 @ THIS TIME

## 2023-06-13 NOTE — ED Notes (Signed)
 Trauma Response Nurse Documentation  Danielle [name] is a 89 y.o. female arriving to [facility] ED via EMS  On Eliquis (apixaban) daily. Trauma was activated as a Level 2 based on the following trauma criteria Elderly patients > 65 with head trauma on anti-coagulation (excluding ASA).  Patient cleared for CT by Dr. Rodena Medin. Pt transported to CT with trauma response nurse present to monitor. RN remained with the patient throughout their absence from the department for clinical observation. GCS 15.  History   Past Medical History:  Diagnosis Date   Anginal pain (HCC)    Arthritis    Asthma    At high risk for falls 11/11/2017   CHF (congestive heart failure) (HCC)    Chronic diastolic congestive heart failure (HCC) 11/11/2015   Chronic insomnia 10/24/2015   Chronic pain of both knees 05/14/2018   Chronic respiratory failure with hypoxia (HCC) 05/05/2017   CKD (chronic kidney disease) stage 3, GFR 30-59 ml/min (HCC) 05/07/2017   COPD (chronic obstructive pulmonary disease) (HCC)    Coronary artery disease    Coronary artery disease involving native coronary artery of native heart without angina pectoris 11/14/2014   Overview:  40% RCA a cardiac catheter in 2014   Current mild episode of major depressive disorder (HCC) 11/14/2014   Depression    DOE (dyspnea on exertion) 09/07/2018   Dysphagia 08/12/2018   Edema 08/12/2018   Elevated blood sugar 11/09/2015   Emphysema lung (HCC)    Flat feet, bilateral 05/30/2020   Gastroesophageal reflux disease without esophagitis 11/11/2017   GERD (gastroesophageal reflux disease)    Headache    History of hiatal hernia    History of kidney stones    Hyperlipidemia    Hypertension    Insomnia    Internal hemorrhoids 11/26/2018   Iron deficiency anemia 08/12/2018   Malaise and fatigue 08/14/2015   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/26/2018   Mesenteric mass 02/19/2015   Midsternal chest pain 11/11/2015   Mixed hyperlipidemia 11/14/2014   Morbid  obesity (HCC) 09/07/2018   OSA (obstructive sleep apnea) 12/16/2019   Osteoporosis    Panlobular emphysema (HCC) 11/09/2015   Pneumonia 2013   PONV (postoperative nausea and vomiting)    Positive D-dimer 09/08/2018   Primary osteoarthritis involving multiple joints 08/14/2015   Restless legs syndrome 03/04/2018   Senile purpura (HCC) 08/12/2018   Shortness of breath dyspnea    with exertion   Stasis edema of left lower extremity 04/13/2018   Strain of right shoulder 08/12/2018   Type 2 diabetes mellitus with stage 3 chronic kidney disease (HCC) 08/14/2015   Unstable angina (HCC) 10/30/2018   Vitamin B12 deficiency 04/13/2018   Vitamin D deficiency 02/27/2016     Past Surgical History:  Procedure Laterality Date   ABDOMINAL HYSTERECTOMY     APPENDECTOMY     BREAST SURGERY  40 yrs. ago   growth removed in each breast-benign   CARDIAC CATHETERIZATION     12/14/12 LHC ([facility]): few mild stenotic areas max 40% of ectatic RCA o/w NL coronaries, EF 65%. Med tx.   CATARACT EXTRACTION W/ INTRAOCULAR LENS  IMPLANT, BILATERAL Bilateral 15 yrs ago   CHOLECYSTECTOMY     COLONOSCOPY  04/22/2013   Diverticulosis in the sigmoid colon. Non bleeding internal hemorrhoids. Normal mucosa vascular pattern in the entire examined colon. Three 6 mm polyps n the descending colon. Resected and retrieved.    CYSTOSCOPY WITH RETROGRADE PYELOGRAM, URETEROSCOPY AND STENT PLACEMENT Right 02/14/2023   Procedure: CYSTOSCOPY WITH RETROGRADE PYELOGRAM,  URETEROSCOPY AND STENT PLACEMENT;  Surgeon: Joline Maxcy, MD;  Location: [facility] OR;  Service: Urology;  Laterality: Right;   CYSTOSCOPY WITH RETROGRADE PYELOGRAM, URETEROSCOPY AND STENT PLACEMENT Right 03/17/2023   Procedure: CYSTOSCOPY WITH RETROGRADE PYELOGRAM, URETEROSCOPY AND STENT PLACEMENT;  Surgeon: Joline Maxcy, MD;  Location: [facility] ORS;  Service: Urology;  Laterality: Right;   FOOT SURGERY     LAPAROSCOPIC APPENDECTOMY N/A 02/19/2015   Procedure: EXCISION OF  MESENTERIC MASS;  Surgeon: Almond Lint, MD;  Location: [facility] OR;  Service: General;  Laterality: N/A;   REPLACEMENT TOTAL KNEE BILATERAL       Initial Focused Assessment (If applicable, or please see trauma documentation): Patient A&Ox4, GCS 15, PERR 3 Airway intact, bilateral breath sounds Pulses 2+ Complains of pain in right breast from fall  CT's Completed:   CT Head and CT C-Spine   Interventions:  IV, labs CXR/PXR/Knee XR CT Head/Cpsine  Plan for disposition:  Admission to floor   Event Summary: Patient to ED after a fall at home. Patient states she tripped on her walker and fell hitting her head on the floor. Patient takes Eliquis, recently diagnosed with the flu. Patient complaining of pain in her right breast. Imaging was ordered and revealed not traumatic injury. Plans for admission to medicine for medical management.  Bedside handoff with ED RN Seward Grater.    Jill Side Ajna Moors  Trauma Response RN  Please call TRN at [phone] for further assistance.

## 2023-06-13 NOTE — ED Notes (Signed)
Patient transported to CT with RN and TRN 

## 2023-06-13 NOTE — ED Triage Notes (Signed)
 Pt arrives via EMS from home with reports of falling after tripping on walker and hitting head and right side on floor. Pt on eliquis. Denies LOC, neck pain. Pt seen at [facility] yesterday for flu. Pt on 2L [place] at home.   EMS gave 100 mcg fentanyl total, 2 albuterol nebs, 125 mg solu-medrol.

## 2023-06-13 NOTE — ED Notes (Signed)
 X-ray at bedside

## 2023-06-13 NOTE — Assessment & Plan Note (Signed)
 AST 66, ALT 52, suspect these elevations are in the setting of influenza - Continue to monitor - AM CMP

## 2023-06-13 NOTE — Plan of Care (Signed)
 Spoke with Dr. Wyline Mood, cardiology who recommended to continue trending troponins as they are likely elevated in the setting of demand ischemia Advised to call back if they become elevated in 1000s

## 2023-06-13 NOTE — Assessment & Plan Note (Signed)
 Patient somnolent on exam but easily arousable to voice.  Suspect this may be in the setting of CO2 retention.  Reassuringly CT head without contrast unremarkable.  She did not hit her head when she fell and no LOC. - Continue to monitor mentation - Repeat VBG, consider BiPAP - Caution sedating meds - Holding home Klonopin for now until she is more awake

## 2023-06-13 NOTE — Assessment & Plan Note (Addendum)
 Likely 2/2 weakness as well as she tripped over walker.  Mainly complaining of right breast pain and right knee pain.  CT head, x-ray pelvis, chest x-ray, CT C-spine reassuringly all unremarkable.  Suspect breast and knee pain musculoskeletal in nature, will continue to monitor for any concerning cardiac symptoms - Orthostatic vital signs - Right knee x-ray - Monitor fluid status - PT/OT - Tylenol 650 mg every 6 hours as needed for pain, caution sedating meds given somnolence - Fall precautions, delirium precautions

## 2023-06-13 NOTE — Hospital Course (Addendum)
 Danielle [name] is a 89 y.o. famle with history of CHF, COPD, T2DM, HTN, HLD, CAD, GERD, OSA, CKD3a who presenting to the ED for acute hypoxic respiratory failure and fall.  Acute hypoxic respiratory failure Presented to the ED on 3/14, diagnosed with influenza and started treatment with cefdinir and prednisone, then discharged home.  Morning of 3/15, patient had a fall (see below) and presented to the ED by EMS.  She was found to be hypoxic, requiring 4 L of LFNC (baseline is 2 L at home).  Initial EKG showing T wave inversions, which then resolved on repeat.  CXR not consistent with pneumonia and procal was WNL.  Given influenza infection, patient was started on renally dosed Tamiflu 5-day course (3/15 -3/20).  Patient was restarted on home maintenance inhaler for COPD, however clinical picture did not fit a COPD exacerbation and prednisone/antibiotics were not given.  Additionally, given congestive heart failure, a BNP was obtained which was only mildly elevated at 120.6.  She did receive IV Lasix 80mg  x1 with good output.  During hospitalization, patient remained hemodynamically stable and oxygen was weaned to baseline.   Fall  R breast pain Patient is on Eliquis 5 mg twice daily, had fall on 3/15 at 6 AM after walker reportedly stuck to carpet.  Patient used life alert, son-in-law and ambulance arrived to patient's home, patient was alert and oriented and denied hitting her head or LOC.  CT head/cervical spine unremarkable, pelvic x-ray unremarkable.  Aside from bruising, no evidence of trauma.  Patient did state her right knee was hurting, and x-ray was obtained which was negative for acute findings.  She continued to complain of R sided pain and in particular R breast pain.  Was treated with oxycodone 2.5 mg Q8h PRN and acetaminophen 650 mg Q6h scheduled.  Gabapentin was increased to 100 mg TID prior to discharge given continuing breast pain.  Hematuria  Urinary retention UA on admission  showed gross hematuria.  Patient was noted to be retaining urine shortly after admission and Foley catheter was placed.  Urology was consulted, who recommended holding Eliquis for 2 days and started tamsulosin.  Hemoglobin remained stable after initial mild downtrend.  Eliquis was restarted at decreased 2.5 mg dose and hemoglobin continued to remain stable.  Void trial was successful on 3/19 and hematuria resolved.  Urology signed off with plan for outpatient follow up.  Tamsulosin was not continued at discharge.  Elevated troponin Most recent troponin downtrending 113.  Most recent EKG WNL.  Prior team spoke with cardiology who suspected demand ischemia.  Troponin then trended down.  No further intervention was necessary.  Hepatitis A Patient had mild AST/ALT elevation initially on admission, which gradually resolved and was initally suspected 2/2 influenza A.  However, hepatitis panel was positive for hepatitis A.  No sources of infection could be identified and false positive is possible.  Patient did not experience diarrhea or other infectious symptoms while hospitalized.  Patient and family were counseled on handwashing prior to discharge.  Other chronic conditions were medically managed with home medications and formulary alternatives as necessary (allergies, restless leg, GERD, HLD, consitpation, peripheral neuropathy).  Follow-up recommendations Repeat BMP and CBC within 1 week to ensure stable Hgb and creatinine Should follow up with Urology (Dr. Margo Aye) outpatient for gross hematuria that occurred during admission. Decreased Eliquis to 2.5 mg BID given hematuria and shared decision-making with family. Increased Metformin to 1000 mg daily, added glargine 10 units daily, discontinued glimepiride

## 2023-06-13 NOTE — Plan of Care (Addendum)
 FMTS Interim Progress Note  S: Doing well from respiratory standpoint.  Still having some right sided chest wall pain after fall.  Tylenol and lidocaine patch has not helped very well.  She remembers the entire fall and did not pass out.  Feels her walker just fell from under her.  O: BP (!) 143/60 (BP Location: Left Arm)   Pulse (!) 58   Temp 98 F (36.7 C) (Oral)   Resp 20   Ht 5\' 3"  (1.6 m)   Wt 108 kg   SpO2 98%   BMI 42.16 kg/m   General: Alert and oriented, in NAD Skin: Warm, dry, and intact; lidocaine patch overlying right chest wall without obvious deformity though with tenderness to palpation HEENT: NCAT, EOM grossly normal, midline nasal septum Cardiac: Mildly bradycardic with regular rhythm, no m/r/g appreciated Respiratory: Difficult to fully assess given inability to take deep breaths given right chest wall pain, breathing and speaking comfortably on 2 L nasal cannula Extremities: Moves all extremities grossly equally Neurological: No gross focal deficit Psychiatric: Appropriate mood and affect   A/P: Acute on chronic hypoxemic respiratory failure in the setting of influenza Breathing speaking comfortably on 2 L nasal cannula, down from 4 L on admission.  Repeat chest x-ray this p.m. again without significant changes.  Recent VBG with pH 7.3 and normal pCO2 and pO2.  She is status post 1 dose of Lasix.  Continue Tamiflu.  Fall, right chest wall pain In the setting of walker falling out from under her.  Orthostatic vital signs pending.  CT head reassuring.  PT OT consulted.  Her pain is currently uncontrolled after the fall on her right chest wall with Tylenol and lidocaine patch.  Will give low-dose methocarbamol once.  If uncontrolled, can consider oxycodone, though use caution given patient's somnolence on admission.  Would like to also avoid NSAIDs given current use of anticoagulants.  Demand ischemia in the setting of influenza Troponins overall decreased from prior.   Day team spoke with cardiology who felt due to demand ischemia.  Patient comfortable on exam at this time with good O2 sats.  Will collect repeat EKG this evening.  Evette Georges, MD 06/13/2023, 7:36 PM PGY-2, [facility] Family Medicine Service pager [phone]

## 2023-06-13 NOTE — ED Provider Notes (Signed)
 [place] EMERGENCY DEPARTMENT AT [facility] Provider Note   CSN: [id number] Arrival date & time: 06/13/23  0801     History  Chief Complaint  Patient presents with   Fall   Shortness of Breath    Danielle [name] is a 89 y.o. female.  89 year old female with prior medical history as detailed below presents for evaluation.  Patient arrives from home with EMS transport.  Patient reports that she got up this morning and then lost her balance while ambulating with her walker.  She did fall onto the floor.  She complains primarily of pain to the right ribs after the fall onto the floor.  She does not think she hit her head.  She is on Eliquis.  She last took a dose of Eliquis sometime yesterday evening she thinks.  Patient lives at home alone.  She triggered a life alert after the fall.  Additionally, patient reports that she was diagnosed with flu yesterday at urgent care.  She reports increased cough and congestion x 2 to 3 days.  She reports increased weakness times 24 hours.  EMS administered 100 mcg of fentanyl for pain, 2 albuterol breathing treatments, and 125 mg of Solu-Medrol.  Patient apparently had significant wheezing on EMSs initial exam.  Patient is on 2 L nasal cannula at home at all times.  The history is provided by the patient and medical records.       Home Medications Prior to Admission medications   Medication Sig Start Date End Date Taking? Authorizing Provider  acetaminophen (TYLENOL) 500 MG tablet Take 500 mg by mouth as needed for mild pain (pain score 1-3) or headache.    [provider]  albuterol (PROVENTIL HFA;VENTOLIN HFA) 108 (90 BASE) MCG/ACT inhaler Inhale 2 puffs into the lungs every 6 (six) hours as needed for wheezing or shortness of breath.     [provider]  amiodarone (PACERONE) 200 MG tablet Take 1 tablet (200 mg total) by mouth daily. 03/05/23   Georgeanna Lea, MD  apixaban Everlene Balls) 5 MG TABS tablet  Take 1 tablet by mouth twice daily 03/10/23   Georgeanna Lea, MD  budesonide-formoterol [facility]) 160-4.5 MCG/ACT inhaler Inhale 2 puffs into the lungs 2 (two) times daily.    [provider]  carboxymethylcellulose (REFRESH PLUS) 0.5 % SOLN Place 1 drop into both eyes daily as needed (dry eyes).    [provider]  cefdinir (OMNICEF) 300 MG capsule Take 1 capsule (300 mg total) by mouth 2 (two) times daily for 5 days. 06/12/23 06/17/23  Dahlia Byes A, FNP  citalopram (CELEXA) 20 MG tablet Take 20 mg by mouth daily. 10/12/16   [provider]  clonazePAM (KLONOPIN) 1 MG tablet Take 1 mg by mouth at bedtime. 11/20/14   [provider]  diclofenac Sodium (VOLTAREN) 1 % GEL APPLY 2 GRAMS TOPICALLY TO AFFECTED AREA THREE TIMES DAILY Patient taking differently: Apply 2 g topically at bedtime. 08/08/19   Nadara Mustard, MD  estradiol (ESTRACE) 0.1 MG/GM vaginal cream Apply 3 times weekly using a pea-sized amount on fingertip as directed 05/13/23   Joline Maxcy, MD  ferrous sulfate 325 (65 FE) MG EC tablet Take 325 mg by mouth daily with breakfast.    [provider]  furosemide (LASIX) 40 MG tablet Take 1 tablet (40 mg total) by mouth daily. Patient taking differently: Take 20-40 mg by mouth See admin instructions. Take 40 mg by mouth daily, may take an  additional 20 mg if needed for swelling or fluid 02/20/23   Jonah Blue, MD  gabapentin (NEURONTIN) 100 MG capsule Take 100 mg by mouth at bedtime.    [provider]  glimepiride (AMARYL) 4 MG tablet Take 4 mg by mouth daily.    [provider]  loratadine (CLARITIN) 10 MG tablet Take 10 mg by mouth daily.    [provider]  metFORMIN (GLUCOPHAGE) 500 MG tablet Take 500 mg by mouth See admin instructions. Take 500 mg by the mouth in the morning, may take an additional 500 mg in the evening if blood sugar is above 200.    [provider]  Multiple Vitamin  (MULTIVITAMIN) capsule Take 1 capsule by mouth daily.    [provider]  nitroGLYCERIN (NITROSTAT) 0.4 MG SL tablet Place 1 tablet (0.4 mg total) under the tongue every 5 (five) minutes as needed for chest pain. 08/22/20 08/21/27  Georgeanna Lea, MD  nystatin (MYCOSTATIN/NYSTOP) powder Apply 1 Application topically 2 (two) times daily as needed (yeast). 07/31/22   [provider]  ondansetron (ZOFRAN-ODT) 4 MG disintegrating tablet Take 1 tablet (4 mg total) by mouth every 8 (eight) hours as needed for nausea or vomiting. 06/12/23   Dahlia Byes A, FNP  pantoprazole (PROTONIX) 40 MG tablet Take 40 mg by mouth 2 (two) times daily. 11/14/16   [provider]  potassium chloride (MICRO-K) 10 MEQ CR capsule Take 10 mEq by mouth daily. 11/20/14   [provider]  predniSONE (DELTASONE) 20 MG tablet Take 1 tablet (20 mg total) by mouth daily with breakfast for 5 days. 06/12/23 06/17/23  Dahlia Byes A, FNP  rOPINIRole (REQUIP) 0.25 MG tablet Take 0.25 mg by mouth at bedtime.    [provider]  rosuvastatin (CRESTOR) 40 MG tablet Take 40 mg by mouth daily.    [provider]  senna (SENOKOT) 8.6 MG TABS tablet Take 2 tablets by mouth at bedtime.    [provider]  sitaGLIPtin (JANUVIA) 100 MG tablet Take 100 mg by mouth daily. 03/11/22   [provider]  spironolactone (ALDACTONE) 25 MG tablet Take 1/2 (one-half) tablet by mouth once daily 03/20/23   Georgeanna Lea, MD  sulfamethoxazole-trimethoprim (BACTRIM) 400-80 MG tablet Take 1 tablet by mouth at bedtime. 05/13/23   Joline Maxcy, MD  Vitamin D, Ergocalciferol, (DRISDOL) 1.25 MG (50000 UNIT) CAPS capsule Take 50,000 Units by mouth every Sunday. 02/10/23   [provider]      Allergies    Codeine, Levofloxacin, Penicillins, Xarelto [rivaroxaban], and Tape    Review of Systems   Review of Systems  All other systems reviewed and are negative.   Physical  Exam Updated Vital Signs BP (!) 124/54   Pulse 69   Temp 98.3 F (36.8 C) (Oral)   Resp 17   Ht 5\' 3"  (1.6 m)   Wt 108 kg   SpO2 98%   BMI 42.16 kg/m  Physical Exam Vitals and nursing note reviewed.  Constitutional:      General: She is not in acute distress.    Appearance: Normal appearance. She is well-developed.  HENT:     Head: Normocephalic and atraumatic.  Eyes:     Conjunctiva/sclera: Conjunctivae normal.     Pupils: Pupils are equal, round, and reactive to light.  Cardiovascular:     Rate and Rhythm: Normal rate and regular rhythm.     Heart sounds: Normal heart sounds.  Pulmonary:  Effort: Pulmonary effort is normal. No respiratory distress.     Breath sounds: Normal breath sounds.     Comments: Mild tenderness overlying the right lateral chest and right anterior chest. Abdominal:     General: There is no distension.     Palpations: Abdomen is soft.     Tenderness: There is no abdominal tenderness.  Musculoskeletal:        General: No deformity. Normal range of motion.     Cervical back: Normal range of motion and neck supple.  Skin:    General: Skin is warm and dry.     Comments: Abrasion to the anterior right knee  Neurological:     General: No focal deficit present.     Mental Status: She is alert and oriented to person, place, and time.     ED Results / Procedures / Treatments   Labs (all labs ordered are listed, but only abnormal results are displayed) Labs Reviewed  CULTURE, BLOOD (ROUTINE X 2)  CULTURE, BLOOD (ROUTINE X 2)  RESP PANEL BY RT-PCR (RSV, FLU A&B, COVID)  [id number]  CBC WITH DIFFERENTIAL/PLATELET  COMPREHENSIVE METABOLIC PANEL  ETHANOL  URINALYSIS, W/ REFLEX TO CULTURE (INFECTION SUSPECTED)  I-STAT CHEM 8, ED  CBG MONITORING, ED  I-STAT CG4 LACTIC ACID, ED  I-STAT VENOUS BLOOD GAS, ED  TYPE AND SCREEN  TROPONIN I (HIGH SENSITIVITY)    EKG EKG Interpretation Date/Time:  Saturday June 13 2023 08:10:15 EDT Ventricular Rate:   70 PR Interval:  235 QRS Duration:  169 QT Interval:  438 QTC Calculation: 473 R Axis:   -65  Text Interpretation: Sinus rhythm Prolonged PR interval RBBB and LAFB Probable left ventricular hypertrophy Confirmed by Kristine Royal [phone]) on 06/13/2023 8:17:25 AM  Radiology DG Chest 2 View Result Date: 06/12/2023 CLINICAL DATA:  Shortness of breath EXAM: CHEST - 2 VIEW COMPARISON:  02/13/2023 FINDINGS: Under penetrated radiographs. Eventration left hemidiaphragm. No consolidation, pneumothorax or effusion. No edema. Normal cardiopericardial silhouette. Calcified aorta. Films are under penetrated. Degenerative changes along the spine. IMPRESSION: No acute cardiopulmonary disease. Electronically Signed   By: Karen Kays M.D.   On: 06/12/2023 13:48    Procedures Procedures    Medications Ordered in ED Medications - No data to display  ED Course/ Medical Decision Making/ A&P                                 Medical Decision Making Amount and/or Complexity of Data Reviewed Labs: ordered. Radiology: ordered.  Risk OTC drugs. Prescription drug management. Decision regarding hospitalization.    Medical Screen Complete  This patient presented to the ED with complaint of fall, acute influenza.  This complaint involves an extensive number of treatment options. The initial differential diagnosis includes, but is not limited to, trauma from fall, viral versus bacterial infection, metabolic abnormality, etc.  This presentation is: Acute, Chronic, Self-Limited, Previously Undiagnosed, Uncertain Prognosis, Complicated, Systemic Symptoms, and Threat to Life/Bodily Function  Patient with extensive comorbidities presents with acute influenza infection x 4 to 5 days.  Patient with significant weakness and resulting fall this morning.  No significant traumatic injury identified on workup from the fall this morning.  However, patient with significant weakness likely related to acute  influenza infection.  She appears unsafe to discharge back to home.  She will benefit from admission.  Medicine service is aware of case and will evaluate for admission.  Co morbidities that complicated  the patient's evaluation  See HPI   Additional history obtained:  External records from outside sources obtained and reviewed including prior ED visits and prior Inpatient records.    Lab Tests:  I ordered and personally interpreted labs.  The pertinent results include: CBC, CMP, troponin, EtOH, lactic acid, COVID, flu   Imaging Studies ordered:  I ordered imaging studies including chest x-ray, pelvis x-ray, CT head, CT C-spine I independently visualized and interpreted obtained imaging which showed no acute traumatic injury I agree with the radiologist interpretation.   Cardiac Monitoring:  The patient was maintained on a cardiac monitor.  I personally viewed and interpreted the cardiac monitor which showed an underlying rhythm of: A-fib   Problem List / ED Course:  Weakness, fall, acute influenza infection   Reevaluation:  After the interventions noted above, I reevaluated the patient and found that they have: stayed the same   Disposition:  After consideration of the diagnostic results and the patients response to treatment, I feel that the patent would benefit from admission.          Final Clinical Impression(s) / ED Diagnoses Final diagnoses:  Fall, initial encounter  Influenza  Dyspnea, unspecified type    Rx / DC Orders ED Discharge Orders     None         Wynetta Fines, MD 06/13/23 1002

## 2023-06-13 NOTE — Assessment & Plan Note (Addendum)
 Likely 2/2 influenza.  Requiring 4 L nasal cannula here, on 2 L as needed at baseline.  Less likely CHF exacerbation given BNP only mildly elevated at 120 with only mild bilateral lower extremities, although chest x-ray appears to have haziness and costophrenic angles. Less suspicious for pneumonia given afebrile, no leukocytosis, and no clear sign of pneumonia on chest x-ray.  Lower suspicion for PE as patient is on anticoagulation.  -Admitted to FMTS, Dr. Manson Passey attending -repeat VBG, obtain Pro-Cal - DuoNeb every 6 hours - Continue home Dulera 2 puffs twice daily - Tamiflu, renally dosed - Continuous pulse ox - Droplet precautions - Blood cultures ordered in ED pending - AM CBC, CMP

## 2023-06-13 NOTE — H&P (Signed)
 Hospital Admission History and Physical Service Pager: [phone]  Patient name: Danielle [name] Medical record number: [id number] Date of Birth: 1933/04/23 Age: 89 y.o. Gender: female  Primary Care Provider: Gordan Payment., MD Consultants: None Code Status: DNR/DNI which was confirmed with family - daughter healthcare POA Preferred Emergency Contact:  Contact Information     Name Relation Home Work Nora Daughter [phone]  [phone]   Valeen, Borys [phone]  [phone]      Other Contacts   None on File    Chief Complaint: Influenza A, fall  Assessment and Plan: Danielle [name] is a 89 y.o. female presenting with acute on chronic respiratory failure with hypoxemia, fall.  Influenza A-patient tested positive, this likely explains her respiratory status.  Will start Tamiflu while admitted Pneumonia-low suspicion for pneumonia, she is afebrile, no leukocytosis, no sign of pneumonia on chest x-ray, will hold on antibiotics for now CHF exacerbation-could consider, although BNP only mildly elevated and patient does not appear profusely hypervolemic  Suspect her fall was due to combination of weakness from having the flu as well as tripping over her walker, no syncope or LOC Assessment & Plan Acute on chronic respiratory failure with hypoxemia (HCC)  Influenza A Likely 2/2 influenza.  Requiring 4 L nasal cannula here, on 2 L as needed at baseline.  Less likely CHF exacerbation given BNP only mildly elevated at 120 with only mild bilateral lower extremities, although chest x-ray appears to have haziness and costophrenic angles. Less suspicious for pneumonia given afebrile, no leukocytosis, and no clear sign of pneumonia on chest x-ray.  Lower suspicion for PE as patient is on anticoagulation.  -Admitted to [facility], Dr. Manson Passey attending -repeat VBG, obtain Pro-Cal - DuoNeb every 6 hours - Continue home Dulera 2 puffs twice daily - Tamiflu,  renally dosed - Continuous pulse ox - Droplet precautions - Blood cultures ordered in ED pending - AM CBC, CMP Fall  R breast pain  R knee pain Likely 2/2 weakness as well as she tripped over walker.  Mainly complaining of right breast pain and right knee pain.  CT head, x-ray pelvis, chest x-ray, CT C-spine reassuringly all unremarkable.  Suspect breast and knee pain musculoskeletal in nature, will continue to monitor for any concerning cardiac symptoms - Orthostatic vital signs - Right knee x-ray - Monitor fluid status - PT/OT - Tylenol 650 mg every 6 hours as needed for pain, caution sedating meds given somnolence - Fall precautions, delirium precautions Elevated troponin Troponin 64, elevated to 343.  Initial EKG with new T wave inversions in V1 through V3, repeat EKG normalized.  Spoke with cardiology who suspects this is in the setting of demand ischemia - Will continue to trend troponins - Will repeat EKG in a few hours Somnolence Patient somnolent on exam but easily arousable to voice.  Suspect this may be in the setting of CO2 retention.  Reassuringly CT head without contrast unremarkable.  She did not hit her head when she fell and no LOC. - Continue to monitor mentation - Repeat VBG, consider BiPAP - Caution sedating meds - Holding home Klonopin for now until she is more awake Type 2 diabetes mellitus (HCC) Home regimen includes glimepiride 4 mg daily, metformin 500 mg daily, and Januvia 100 mg daily.  Holding home medications while admitted, will need this reconciled prior to discharge. -Sensitive SSI - CBGs for meals and at bedtime - AM A1c Elevated transaminase level AST 66, ALT 52, suspect  these elevations are in the setting of influenza - Continue to monitor - AM CMP  Chronic and Stable Problems: CHF-continue spironolactone 12.5 mg daily, holding home Lasix Atrial fibrillation-continue home amiodarone 200 mg, Eliquis 5 mg twice daily Neuropathy-continue  gabapentin 100 mg at bedtime Allergies-continue Claritin 10 mg daily GERD-continue pantoprazole 40 mg twice daily Restless leg-continue ropinirole 0.25 mg at bedtime Hyperlipidemia-continue Crestor 40 mg daily Constipation-continue senna 17.2 mg at bedtime   FEN/GI: Carb modified VTE Prophylaxis: Home Eliquis  Disposition: Med telemetry  History of Present Illness:  Danielle [name] is a 89 y.o. female presenting with fall. Patient was very recently diagnosed with flu a at urgent care, was given steroids and sent home.  This morning she stood up, felt weak, and then tripped over her walker landing on her right side.  Denies hitting her head or LOC.  Has life alert so EMS arrived shortly after the fall.  Son came to see her when life alert went off and said she was alert and awake when she fell. In regard to flu, she has had cough and congestion for around 4 days per family.  Family denies history of dementia but reports patient has been repeating herself a lot lately. Patient's only complaints right now are right breast pain and right knee pain Patient is on 2 L O2 as needed at home  In the ED, imaging studies unremarkable, troponin mildly elevated at 64, labs otherwise overall unremarkable.  Review Of Systems: Per HPI with the following additions: None  Pertinent Past Medical History: CHF Asthma COPD T2DM HTN Hyperlipidemia Iron deficiency anemia CAD Afib GERD OSA (not on CPAP) CKD3a Nephrolithiasis  Remainder reviewed in history tab.   Pertinent Past Surgical History: Hysterectomy Appendectomy Breast surgery Cholecystectomy Cystoscopy with ureteroscopy and stent placement 2024 Appendectomy Bilateral total knee replacement Foot surgery  Remainder reviewed in history tab.  Pertinent Social History: Tobacco use: Former quit 10 years ago Alcohol use: None Other Substance use: None Lives alone  Pertinent Family History: Mother - MI  Remainder reviewed in  history tab.   Important Outpatient Medications: Albuterol 2 puffs every 6 hours as needed Refresh eyedrops Celexa 20 mg daily Estradiol vaginal cream 3 times weekly Ferrous sulfate 325 mg daily Lasix 40 mg daily Glimepiride 4 mg daily Metformin 500 mg daily Potassium 10 mill equivalents daily Januvia 100 mg daily Vitamin D 50,000 units weekly Amiodarone 200 mg daily Eliquis 500 mg twice daily Symbicort 2 puffs twice daily Klonopin 1 mg at bedtime Gabapentin 100 mg at bedtime Claritin 10 mg daily Protonix 40 mg twice daily Ropinirole 0.25 mg at bedtime Crestor 40 mg daily Senna 8.6 mg 2 tablets at bedtime Spironolactone 12.5 mg daily Cranberry supplement  Remainder reviewed in medication history.   Objective: BP (!) 125/51   Pulse (!) 58   Temp 98.3 F (36.8 C) (Oral)   Resp 17   Ht 5\' 3"  (1.6 m)   Wt 108 kg   SpO2 100%   BMI 42.16 kg/m  Exam: General: No acute distress, sleeping but easily arouses to voice Eyes: No scleral icterus ENTM: Moist mucous membranes, no facial ecchymosis Neck: No C-spine tenderness to palpation Cardiovascular: Regular rate and rhythm, 2+ radial pulses Respiratory: Audible upper airway noises while sleeping.  Full exam limited secondary to habitus and only able to listen anteriorly, however scattered intermittent wheezes auscultated.  Saturating well on 4 L nasal cannula which is her baseline. Gastrointestinal: Bowel sounds present, soft, nontender.  No abdominal  bruising. MSK: Chest wall tenderness to palpation, specifically over sternum and right breast.  No bruising noted chest or under breasts bilaterally.  Ecchymosis over right upper arm.  Abrasion to right knee that is slightly bleeding.  Tenderness to palpation to right knee, no swelling.  No bony deformity palpated to bilateral upper and bilateral lower extremities.  Bilateral lower extremities with edema, worse around ankles. Neuro: Easily arousable to voice  Labs:  CBC BMET   Recent Labs  Lab 06/13/23 0831  WBC 6.7  HGB 10.9*  HCT 34.9*  PLT 165   Recent Labs  Lab 06/13/23 0831  NA 134*  K 4.9  CL 101  CO2 21*  BUN 29*  CREATININE 1.10*  GLUCOSE 249*  CALCIUM 8.2*    VBG: pH 7.476, CO2 31.6 AST 66, ALT 52 Troponin 64 Lactic acid 0.9 ETOH <10 Bcx pending U/A with large Hgb Influenza A positive  EKG: Initial EKG NSR 70BPM with what appeared to be motion artifact, with T wave inversions in V1-V3. Repeat EKG NSR at 59BPM, Qtc , Left axis deviation, no STT changes   Imaging Studies Performed: CXR 06/13/23 IMPRESSION: No acute cardiopulmonary disease.   XR Pelvis 06/13/23 IMPRESSION: Negative  CT Head WO Contrast 06/13/23 IMPRESSION: No evidence of acute intracranial or cervical spine injury.   CT C-Spine 06/13/23 IMPRESSION: No evidence of acute intracranial or cervical spine injury.  Shemekia Patane, DO 06/13/2023, 1:17 PM PGY-2, [facility]  [facility] Intern pager: [phone], text pages welcome Secure chat group [facility] Teaching Service

## 2023-06-13 NOTE — Assessment & Plan Note (Signed)
 Troponin 64, elevated to 343.  Initial EKG with new T wave inversions in V1 through V3, repeat EKG normalized.  Spoke with cardiology who suspects this is in the setting of demand ischemia - Will continue to trend troponins - Will repeat EKG in a few hours

## 2023-06-14 DIAGNOSIS — J111 Influenza due to unidentified influenza virus with other respiratory manifestations: Secondary | ICD-10-CM | POA: Diagnosis not present

## 2023-06-14 DIAGNOSIS — Z789 Other specified health status: Secondary | ICD-10-CM

## 2023-06-14 DIAGNOSIS — R310 Gross hematuria: Secondary | ICD-10-CM

## 2023-06-14 DIAGNOSIS — N029 Recurrent and persistent hematuria with unspecified morphologic changes: Secondary | ICD-10-CM

## 2023-06-14 DIAGNOSIS — W19XXXA Unspecified fall, initial encounter: Secondary | ICD-10-CM | POA: Diagnosis not present

## 2023-06-14 LAB — COMPREHENSIVE METABOLIC PANEL
ALT: 59 U/L — ABNORMAL HIGH (ref 0–44)
AST: 63 U/L — ABNORMAL HIGH (ref 15–41)
Albumin: 3.2 g/dL — ABNORMAL LOW (ref 3.5–5.0)
Alkaline Phosphatase: 42 U/L (ref 38–126)
Anion gap: 14 (ref 5–15)
BUN: 35 mg/dL — ABNORMAL HIGH (ref 8–23)
CO2: 27 mmol/L (ref 22–32)
Calcium: 8.7 mg/dL — ABNORMAL LOW (ref 8.9–10.3)
Chloride: 100 mmol/L (ref 98–111)
Creatinine, Ser: 1.21 mg/dL — ABNORMAL HIGH (ref 0.44–1.00)
GFR, Estimated: 43 mL/min — ABNORMAL LOW (ref >60)
Glucose, Bld: 171 mg/dL — ABNORMAL HIGH (ref 70–99)
Potassium: 4.1 mmol/L (ref 3.5–5.1)
Sodium: 141 mmol/L (ref 135–145)
Total Bilirubin: 0.4 mg/dL (ref 0.0–1.2)
Total Protein: 6 g/dL — ABNORMAL LOW (ref 6.5–8.1)

## 2023-06-14 LAB — CBC
HCT: 31.8 % — ABNORMAL LOW (ref 36.0–46.0)
HCT: 32.9 % — ABNORMAL LOW (ref 36.0–46.0)
Hemoglobin: 10.1 g/dL — ABNORMAL LOW (ref 12.0–15.0)
Hemoglobin: 10.5 g/dL — ABNORMAL LOW (ref 12.0–15.0)
MCH: 30.1 pg (ref 26.0–34.0)
MCH: 30.5 pg (ref 26.0–34.0)
MCHC: 31.8 g/dL (ref 30.0–36.0)
MCHC: 31.9 g/dL (ref 30.0–36.0)
MCV: 94.6 fL (ref 80.0–100.0)
MCV: 95.6 fL (ref 80.0–100.0)
Platelets: 167 10*3/uL (ref 150–400)
Platelets: 172 10*3/uL (ref 150–400)
RBC: 3.36 MIL/uL — ABNORMAL LOW (ref 3.87–5.11)
RBC: 3.44 MIL/uL — ABNORMAL LOW (ref 3.87–5.11)
RDW: 15.8 % — ABNORMAL HIGH (ref 11.5–15.5)
RDW: 15.8 % — ABNORMAL HIGH (ref 11.5–15.5)
WBC: 4.9 10*3/uL (ref 4.0–10.5)
WBC: 5 10*3/uL (ref 4.0–10.5)
nRBC: 0 % (ref 0.0–0.2)
nRBC: 0 % (ref 0.0–0.2)

## 2023-06-14 LAB — GLUCOSE, CAPILLARY
Glucose-Capillary: 150 mg/dL — ABNORMAL HIGH (ref 70–99)
Glucose-Capillary: 251 mg/dL — ABNORMAL HIGH (ref 70–99)
Glucose-Capillary: 217 mg/dL — ABNORMAL HIGH (ref 70–99)

## 2023-06-14 LAB — HEMOGLOBIN A1C
Hgb A1c MFr Bld: 7.4 % — ABNORMAL HIGH (ref 4.8–5.6)
Mean Plasma Glucose: 165.68 mg/dL

## 2023-06-14 LAB — HEMOGLOBIN AND HEMATOCRIT, BLOOD
HCT: 32.1 % — ABNORMAL LOW (ref 36.0–46.0)
Hemoglobin: 10.3 g/dL — ABNORMAL LOW (ref 12.0–15.0)

## 2023-06-14 LAB — TROPONIN I (HIGH SENSITIVITY): Troponin I (High Sensitivity): 113 ng/L (ref <18)

## 2023-06-14 MED ORDER — POLYETHYLENE GLYCOL 3350 17 G PO PACK
17.0000 g | PACK | Freq: Every day | ORAL | Status: DC
  Administered 2023-06-14 – 2023-06-19 (×5): 17 g via ORAL
  Filled 2023-06-14 (×7): qty 1

## 2023-06-14 MED ORDER — ACETAMINOPHEN 650 MG RE SUPP: 650.0000 mg | Freq: Four times a day (QID) | RECTAL | Status: DC

## 2023-06-14 MED ORDER — ALBUTEROL SULFATE (2.5 MG/3ML) 0.083% IN NEBU
2.5000 mg | INHALATION_SOLUTION | RESPIRATORY_TRACT | Status: DC | PRN
  Administered 2023-06-16 – 2023-06-19 (×3): 2.5 mg via RESPIRATORY_TRACT
  Filled 2023-06-14 (×4): qty 3

## 2023-06-14 MED ORDER — ACETAMINOPHEN 325 MG PO TABS
650.0000 mg | ORAL_TABLET | Freq: Four times a day (QID) | ORAL | Status: DC
  Administered 2023-06-14 – 2023-06-20 (×25): 650 mg via ORAL
  Filled 2023-06-14 (×27): qty 2

## 2023-06-14 MED ORDER — INSULIN ASPART 100 UNIT/ML IJ SOLN
0.0000 [IU] | Freq: Three times a day (TID) | INTRAMUSCULAR | Status: DC
  Administered 2023-06-14: 5 [IU] via SUBCUTANEOUS
  Administered 2023-06-15: 2 [IU] via SUBCUTANEOUS
  Administered 2023-06-15: 15 [IU] via SUBCUTANEOUS
  Administered 2023-06-15: 8 [IU] via SUBCUTANEOUS
  Administered 2023-06-16: 3 [IU] via SUBCUTANEOUS
  Administered 2023-06-16 (×2): 5 [IU] via SUBCUTANEOUS
  Administered 2023-06-17: 8 [IU] via SUBCUTANEOUS
  Administered 2023-06-17: 3 [IU] via SUBCUTANEOUS
  Administered 2023-06-17: 5 [IU] via SUBCUTANEOUS
  Administered 2023-06-18 (×2): 3 [IU] via SUBCUTANEOUS
  Administered 2023-06-18 – 2023-06-19 (×2): 5 [IU] via SUBCUTANEOUS
  Administered 2023-06-19 – 2023-06-20 (×2): 3 [IU] via SUBCUTANEOUS

## 2023-06-14 MED ORDER — TAMSULOSIN HCL 0.4 MG PO CAPS
0.4000 mg | ORAL_CAPSULE | Freq: Every day | ORAL | Status: DC
  Administered 2023-06-14 – 2023-06-19 (×6): 0.4 mg via ORAL
  Filled 2023-06-14 (×6): qty 1

## 2023-06-14 MED ORDER — OXYCODONE HCL 5 MG PO TABS
2.5000 mg | ORAL_TABLET | Freq: Three times a day (TID) | ORAL | Status: DC | PRN
  Administered 2023-06-14 – 2023-06-20 (×15): 2.5 mg via ORAL
  Filled 2023-06-14 (×15): qty 1

## 2023-06-14 MED ORDER — IPRATROPIUM-ALBUTEROL 0.5-2.5 (3) MG/3ML IN SOLN
3.0000 mL | Freq: Two times a day (BID) | RESPIRATORY_TRACT | Status: DC
  Administered 2023-06-14 – 2023-06-20 (×12): 3 mL via RESPIRATORY_TRACT
  Filled 2023-06-14 (×13): qty 3

## 2023-06-14 MED ORDER — IPRATROPIUM-ALBUTEROL 0.5-2.5 (3) MG/3ML IN SOLN: 3.0000 mL | Freq: Two times a day (BID) | RESPIRATORY_TRACT | Status: DC

## 2023-06-14 NOTE — Progress Notes (Signed)
 Patient up to bedside commode twice. Very unsteady on her feet and O2 sats fell to 85% upon exertion. Voids very small amount. Complains that she has to urinate but cannot empty bladder.  Bladder scan demonstrates after patient tried to void.  States that she cannot urinate with [place] while lying in bed.  Will notify physician on call.

## 2023-06-14 NOTE — Assessment & Plan Note (Addendum)
 A1c 7.4.  CBG this morning 150. Home regimen includes glimepiride 4 mg daily, metformin 500 mg daily, and Januvia 100 mg daily.  Continue holding home medications while admitted, will need this reconciled prior to discharge. -Sensitive SSI; augment as needed - CBGs for meals and at bedtime

## 2023-06-14 NOTE — Consult Note (Signed)
 I have been asked to see the patient by Dr. Terisa Starr, for evaluation and management of gross hematuria.  History of present illness: 89 year old female on Eliquis presented to the hospital yesterday after falling at home.  She fell from standing.  She landed on her right side.  She had a lot of x-rays in the emergency department and there were no fractures.  Over the last night she was having significant urinary frequency and sense of incomplete bladder emptying.  She was having trouble getting help with the bedpan or to the bedside commode and as result decision was made to place Foley catheter.  The patient states that at home she was not having any gross hematuria although she does have a history of this and has had some over the last several months.  The catheter now is draining bloody urine.  There does not appear to be any clots.  She is not having any pain and seems to be tolerating the catheter well.  The patient sees Dr. Shelby Dubin at [facility] urology.  In December 2024 she underwent ureteroscopy right proximal ureteral stone.  She is also manage for recurrent urinary tract infections and intermittent gross hematuria.  She recently was on suppression antibiotics for UTI prevention.   Review of systems: A 12 point comprehensive review of systems was obtained and is negative unless otherwise stated in the history of present illness.  Patient Active Problem List   Diagnosis Date Noted   Chronic health problem 06/14/2023   Hematuria, gross 06/14/2023   Influenza 06/13/2023   Fall  R breast pain  R knee pain 06/13/2023   Influenza A 06/13/2023   Acute on chronic respiratory failure with hypoxemia (HCC)  Influenza A 06/13/2023   Somnolence 06/13/2023   Elevated troponin 06/13/2023   Type 2 diabetes mellitus (HCC) 06/13/2023   Elevated transaminase level 06/13/2023   Breast pain, right 06/13/2023   Acute deep vein thrombosis (DVT) of right lower extremity (HCC)  02/19/2023   Nephrolithiasis 02/14/2023   Peripheral polyneuropathy 09/10/2022   Claudication (HCC) 09/10/2022   Subclinical hypothyroidism 03/11/2022   Atrial fibrillation (HCC) 02/28/2022   Paroxysmal atrial fibrillation (HCC) 02/27/2022   Acute on chronic diastolic CHF (congestive heart failure) (HCC) 02/27/2022   Multifocal atrial tachycardia (HCC) 02/26/2022   Coronary artery disease    PONV (postoperative nausea and vomiting)    Osteoporosis    Insomnia    Hypertension    Hyperlipidemia    History of hiatal hernia    Headache    GERD (gastroesophageal reflux disease)    Emphysema lung (HCC)    COPD (chronic obstructive pulmonary disease) (HCC)    CHF (congestive heart failure) (HCC)    Asthma    Arthritis    Anginal pain (HCC)    Callus 05/30/2020   Flat feet, bilateral 05/30/2020   Hammer toe of left foot 05/30/2020   OSA (obstructive sleep apnea) 12/16/2019   Internal hemorrhoids 11/26/2018   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/26/2018   Unstable angina (HCC) 10/30/2018   Positive D-dimer 09/08/2018   DOE (dyspnea on exertion) 09/07/2018   Class 3 obesity 09/07/2018   Edema 08/12/2018   Iron deficiency anemia 08/12/2018   Senile purpura (HCC) 08/12/2018   Strain of right shoulder 08/12/2018   Dysphagia 08/12/2018   Chronic pain of both knees 05/14/2018   Stasis edema of left lower extremity 04/13/2018   Vitamin B12 deficiency 04/13/2018   Restless legs syndrome 03/04/2018   At high  risk for falls 11/11/2017   Gastroesophageal reflux disease without esophagitis 11/11/2017   Stage 3a chronic kidney disease (HCC) 05/07/2017   Chronic respiratory failure with hypoxia (HCC) 05/05/2017   Vitamin D deficiency 02/27/2016   Nocturnal hypoxemia due to emphysema (HCC) 12/04/2015   Midsternal chest pain 11/11/2015   Chronic diastolic congestive heart failure (HCC) 11/11/2015   Chest pain 11/09/2015   Depression 11/09/2015   Panlobular emphysema (HCC) 11/09/2015   Elevated blood sugar  11/09/2015   Chronic insomnia 10/24/2015   Type 2 diabetes mellitus with hyperlipidemia (HCC) 08/14/2015   Primary osteoarthritis involving multiple joints 08/14/2015   Malaise and fatigue 08/14/2015   Degenerative lumbar disc 08/14/2015   Abnormal gait 08/14/2015   Mesenteric mass 02/19/2015   Coronary artery disease involving native coronary artery of native heart without angina pectoris 11/14/2014   Mixed hyperlipidemia 11/14/2014   Current mild episode of major depressive disorder (HCC) 11/14/2014   Pneumonia 2013    Current Facility-Administered Medications on File Prior to Encounter  Medication Dose Route Frequency Provider Last Rate Last Admin   clindamycin (CLEOCIN) 900 mg in dextrose 5 % 50 mL IVPB  900 mg Intravenous 60 min Pre-Op Almond Lint, MD       And   gentamicin (GARAMYCIN) 5 mg/kg in dextrose 5 % 50 mL IVPB  5 mg/kg Intravenous 60 min Pre-Op Almond Lint, MD       Current Outpatient Medications on File Prior to Encounter  Medication Sig Dispense Refill   acetaminophen (TYLENOL) 500 MG tablet Take 500 mg by mouth as needed for mild pain (pain score 1-3) or headache.     albuterol (PROVENTIL HFA;VENTOLIN HFA) 108 (90 BASE) MCG/ACT inhaler Inhale 2 puffs into the lungs every 6 (six) hours as needed for wheezing or shortness of breath.      amiodarone (PACERONE) 200 MG tablet Take 1 tablet (200 mg total) by mouth daily. 90 tablet 2   apixaban (ELIQUIS) 5 MG TABS tablet Take 1 tablet by mouth twice daily 180 tablet 1   budesonide-formoterol (SYMBICORT) 160-4.5 MCG/ACT inhaler Inhale 2 puffs into the lungs 2 (two) times daily.     carboxymethylcellulose (REFRESH PLUS) 0.5 % SOLN Place 1 drop into both eyes daily as needed (dry eyes).     cefdinir (OMNICEF) 300 MG capsule Take 1 capsule (300 mg total) by mouth 2 (two) times daily for 5 days. 10 capsule 0   citalopram (CELEXA) 20 MG tablet Take 20 mg by mouth daily.     clonazePAM (KLONOPIN) 1 MG tablet Take 1 mg by mouth  at bedtime.     CRANBERRY PO Take 1 tablet by mouth daily.     diclofenac Sodium (VOLTAREN) 1 % GEL APPLY 2 GRAMS TOPICALLY TO AFFECTED AREA THREE TIMES DAILY 100 g 0   estradiol (ESTRACE) 0.1 MG/GM vaginal cream Apply 3 times weekly using a pea-sized amount on fingertip as directed 42.5 g 12   ferrous sulfate 325 (65 FE) MG EC tablet Take 325 mg by mouth daily with breakfast.     furosemide (LASIX) 20 MG tablet Take 40 mg by mouth daily.     gabapentin (NEURONTIN) 100 MG capsule Take 100 mg by mouth at bedtime.     glimepiride (AMARYL) 2 MG tablet Take 2 mg by mouth daily.     loratadine (CLARITIN) 10 MG tablet Take 10 mg by mouth daily.     metFORMIN (GLUCOPHAGE) 500 MG tablet Take 500 mg by mouth See admin instructions. Take 500  mg by the mouth in the morning, may take an additional 500 mg in the evening if blood sugar is above 200.     Multiple Vitamin (MULTIVITAMIN) capsule Take 1 capsule by mouth daily.     nitroGLYCERIN (NITROSTAT) 0.4 MG SL tablet Place 1 tablet (0.4 mg total) under the tongue every 5 (five) minutes as needed for chest pain. 25 tablet 11   nystatin (MYCOSTATIN/NYSTOP) powder Apply 1 Application topically 2 (two) times daily as needed (yeast).     pantoprazole (PROTONIX) 40 MG tablet Take 40 mg by mouth 2 (two) times daily.     potassium chloride (MICRO-K) 10 MEQ CR capsule Take 10 mEq by mouth daily.     Probiotic Product (CULTRELLE KIDS IMMUNE DEFENSE PO) Take 1 Dose by mouth daily.     rOPINIRole (REQUIP) 0.25 MG tablet Take 0.25 mg by mouth at bedtime.     rosuvastatin (CRESTOR) 40 MG tablet Take 40 mg by mouth daily.     senna (SENOKOT) 8.6 MG TABS tablet Take 2 tablets by mouth at bedtime.     sitaGLIPtin (JANUVIA) 100 MG tablet Take 100 mg by mouth daily.     spironolactone (ALDACTONE) 25 MG tablet Take 1/2 (one-half) tablet by mouth once daily 45 tablet 3   sulfamethoxazole-trimethoprim (BACTRIM) 400-80 MG tablet Take 1 tablet by mouth at bedtime. 45 tablet 1    Vitamin D, Ergocalciferol, (DRISDOL) 1.25 MG (50000 UNIT) CAPS capsule Take 50,000 Units by mouth every Sunday.     furosemide (LASIX) 40 MG tablet Take 1 tablet (40 mg total) by mouth daily. (Patient not taking: Reported on 06/14/2023) 30 tablet 1   ondansetron (ZOFRAN-ODT) 4 MG disintegrating tablet Take 1 tablet (4 mg total) by mouth every 8 (eight) hours as needed for nausea or vomiting. (Patient not taking: Reported on 06/14/2023) 20 tablet 0   predniSONE (DELTASONE) 20 MG tablet Take 1 tablet (20 mg total) by mouth daily with breakfast for 5 days. (Patient not taking: Reported on 06/14/2023) 5 tablet 0    Past Medical History:  Diagnosis Date   Anginal pain (HCC)    Arthritis    Asthma    At high risk for falls 11/11/2017   CHF (congestive heart failure) (HCC)    Chronic diastolic congestive heart failure (HCC) 11/11/2015   Chronic insomnia 10/24/2015   Chronic pain of both knees 05/14/2018   Chronic respiratory failure with hypoxia (HCC) 05/05/2017   CKD (chronic kidney disease) stage 3, GFR 30-59 ml/min (HCC) 05/07/2017   COPD (chronic obstructive pulmonary disease) (HCC)    Coronary artery disease    Coronary artery disease involving native coronary artery of native heart without angina pectoris 11/14/2014   Overview:  40% RCA a cardiac catheter in 2014   Current mild episode of major depressive disorder (HCC) 11/14/2014   Depression    DOE (dyspnea on exertion) 09/07/2018   Dysphagia 08/12/2018   Edema 08/12/2018   Elevated blood sugar 11/09/2015   Emphysema lung (HCC)    Flat feet, bilateral 05/30/2020   Gastroesophageal reflux disease without esophagitis 11/11/2017   GERD (gastroesophageal reflux disease)    Headache    History of hiatal hernia    History of kidney stones    Hyperlipidemia    Hypertension    Insomnia    Internal hemorrhoids 11/26/2018   Iron deficiency anemia 08/12/2018   Malaise and fatigue 08/14/2015   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/26/2018   Mesenteric mass  02/19/2015   Midsternal chest pain 11/11/2015  Mixed hyperlipidemia 11/14/2014   Morbid obesity (HCC) 09/07/2018   OSA (obstructive sleep apnea) 12/16/2019   Osteoporosis    Panlobular emphysema (HCC) 11/09/2015   Pneumonia 2013   PONV (postoperative nausea and vomiting)    Positive D-dimer 09/08/2018   Primary osteoarthritis involving multiple joints 08/14/2015   Restless legs syndrome 03/04/2018   Senile purpura (HCC) 08/12/2018   Shortness of breath dyspnea    with exertion   Stasis edema of left lower extremity 04/13/2018   Strain of right shoulder 08/12/2018   Type 2 diabetes mellitus with stage 3 chronic kidney disease (HCC) 08/14/2015   Unstable angina (HCC) 10/30/2018   Vitamin B12 deficiency 04/13/2018   Vitamin D deficiency 02/27/2016    Past Surgical History:  Procedure Laterality Date   ABDOMINAL HYSTERECTOMY     APPENDECTOMY     BREAST SURGERY  40 yrs. ago   growth removed in each breast-benign   CARDIAC CATHETERIZATION     12/14/12 LHC ([facility]): few mild stenotic areas max 40% of ectatic RCA o/w NL coronaries, EF 65%. Med tx.   CATARACT EXTRACTION W/ INTRAOCULAR LENS  IMPLANT, BILATERAL Bilateral 15 yrs ago   CHOLECYSTECTOMY     COLONOSCOPY  04/22/2013   Diverticulosis in the sigmoid colon. Non bleeding internal hemorrhoids. Normal mucosa vascular pattern in the entire examined colon. Three 6 mm polyps n the descending colon. Resected and retrieved.    CYSTOSCOPY WITH RETROGRADE PYELOGRAM, URETEROSCOPY AND STENT PLACEMENT Right 02/14/2023   Procedure: CYSTOSCOPY WITH RETROGRADE PYELOGRAM, URETEROSCOPY AND STENT PLACEMENT;  Surgeon: Joline Maxcy, MD;  Location: [facility] OR;  Service: Urology;  Laterality: Right;   CYSTOSCOPY WITH RETROGRADE PYELOGRAM, URETEROSCOPY AND STENT PLACEMENT Right 03/17/2023   Procedure: CYSTOSCOPY WITH RETROGRADE PYELOGRAM, URETEROSCOPY AND STENT PLACEMENT;  Surgeon: Joline Maxcy, MD;  Location: [facility] ORS;  Service: Urology;  Laterality:  Right;   FOOT SURGERY     LAPAROSCOPIC APPENDECTOMY N/A 02/19/2015   Procedure: EXCISION OF MESENTERIC MASS;  Surgeon: Almond Lint, MD;  Location: [facility] OR;  Service: General;  Laterality: N/A;   REPLACEMENT TOTAL KNEE BILATERAL      Social History   Tobacco Use   Smoking status: Former    Current packs/day: 0.00    Types: Cigarettes    Quit date: 08/21/2016    Years since quitting: 6.8   Smokeless tobacco: Never  Vaping Use   Vaping status: Never Used  Substance Use Topics   Alcohol use: No   Drug use: No    Family History  Problem Relation Age of Onset   Heart attack Mother     PE: Vitals:   06/13/23 2020 06/14/23 0131 06/14/23 0406 06/14/23 0831  BP: (!) 151/132  (!) 115/58 (!) 137/54  Pulse: 60  (!) 56 (!) 58  Resp: 16  16   Temp: 98.4 F (36.9 C)  97.6 F (36.4 C)   TempSrc: Oral  Oral   SpO2: 99% 99% 98% 99%  Weight:      Height:       Patient appears to be in no mild distress  patient is alert and oriented x3 Atraumatic normocephalic head No cervical or supraclavicular lymphadenopathy appreciated Increased work of breathing Regular sinus rhythm/rate Abdomen is soft, nontender, nondistended, right sided chest discomfort Lower extremities are symmetric without appreciable edema Grossly neurologically intact No identifiable skin lesions  Recent Labs    06/13/23 0831 06/14/23 0724 06/14/23 1024  WBC 6.7 4.9  --   HGB 10.9* 10.1* 10.3*  HCT 34.9* 31.8* 32.1*   Recent Labs    06/13/23 0819 06/13/23 0831 06/14/23 0724  NA 134*  134* 134* 141  K 5.1  5.0 4.9 4.1  CL 107 101 100  CO2  --  21* 27  GLUCOSE 253* 249* 171*  BUN 38* 29* 35*  CREATININE 1.10* 1.10* 1.21*  CALCIUM  --  8.2* 8.7*   No results for input(s): "LABPT", "INR" in the last 72 hours. No results for input(s): "LABURIN" in the last 72 hours. Results for orders placed or performed during the hospital encounter of 06/13/23  Resp panel by RT-PCR (RSV, Flu A&B, Covid) Anterior  Nasal Swab     Status: Abnormal   Collection Time: 06/13/23  8:09 AM   Specimen: Anterior Nasal Swab  Result Value Ref Range Status   SARS Coronavirus 2 by RT PCR NEGATIVE NEGATIVE Final   Influenza A by PCR POSITIVE (A) NEGATIVE Final   Influenza B by PCR NEGATIVE NEGATIVE Final    Comment: (NOTE) The Xpert Xpress SARS-CoV-2/FLU/RSV plus assay is intended as an aid in the diagnosis of influenza from Nasopharyngeal swab specimens and should not be used as a sole basis for treatment. Nasal washings and aspirates are unacceptable for Xpert Xpress SARS-CoV-2/FLU/RSV testing.  Fact Sheet for Patients: BloggerCourse.com  Fact Sheet for Healthcare Providers: SeriousBroker.it  This test is not yet approved or cleared by the [place] FDA and has been authorized for detection and/or diagnosis of SARS-CoV-2 by FDA under an Emergency Use Authorization (EUA). This EUA will remain in effect (meaning this test can be used) for the duration of the COVID-19 declaration under Section 564(b)(1) of the Act, 21 U.S.C. section 360bbb-3(b)(1), unless the authorization is terminated or revoked.     Resp Syncytial Virus by PCR NEGATIVE NEGATIVE Final    Comment: (NOTE) Fact Sheet for Patients: BloggerCourse.com  Fact Sheet for Healthcare Providers: SeriousBroker.it  This test is not yet approved or cleared by the [place] FDA and has been authorized for detection and/or diagnosis of SARS-CoV-2 by FDA under an Emergency Use Authorization (EUA). This EUA will remain in effect (meaning this test can be used) for the duration of the COVID-19 declaration under Section 564(b)(1) of the Act, 21 U.S.C. section 360bbb-3(b)(1), unless the authorization is terminated or revoked.  Performed at [facility], [address], [place], [place] [address]   Culture, blood (routine x 2)     Status:  None (Preliminary result)   Collection Time: 06/13/23  8:31 AM   Specimen: BLOOD LEFT ARM  Result Value Ref Range Status   Specimen Description BLOOD LEFT ARM  Final   Special Requests   Final    AEROBIC BOTTLE ONLY Blood Culture results may not be optimal due to an inadequate volume of blood received in culture bottles   Culture   Final    NO GROWTH < 24 HOURS Performed at [facility], [address], [place], [place] [address]    Report Status PENDING  Incomplete  Culture, blood (routine x 2)     Status: None (Preliminary result)   Collection Time: 06/13/23  8:31 AM   Specimen: BLOOD RIGHT ARM  Result Value Ref Range Status   Specimen Description BLOOD RIGHT ARM  Final   Special Requests   Final    AEROBIC BOTTLE ONLY Blood Culture results may not be optimal due to an inadequate volume of blood received in culture bottles   Culture   Final  NO GROWTH < 24 HOURS Performed at [facility], [address], [place], [place] [address]    Report Status PENDING  Incomplete    Imaging: I reviewed the CT scan from November 2024.  This demonstrated a right proximal ureteral stone with bilateral nonobstructing stones.  There is nothing within her bladder.  Imp:  The patient has gross hematuria.  The hematuria does not appear to be too significant, I do not see any clots in the catheter or tubing.  She is anticoagulated and was complaining of urinary frequency and urgency overnight.  This may well be from a urinary tract infection.  However, she did fall from standing, and potentially as developed some gross hematuria from a renal contusion.  She does have stones in her kidneys as well which could be contributing to the hematuria.  Further could be also from Foley trauma as she does appear to have significant difficulty moving around and could have tugged on the catheter.  Fortunately less than 3 months ago she had cystoscopy and she has had upper tract imaging within the last 3 to  4 months as well and her hematuria is not related to any malignancy.  Recommendations: Will hold the patient's Eliquis until her hematuria stops.  Would send a urine culture to ensure she does not have an infection.  Consider renal ultrasound to evaluate for renal contusion.  However, her hemoglobin is stable, and the bleeding does not appear to be too bad.  We have been managing any contusion conservatively, and I do not think imaging is going to change our management.  Once the patient is more ambulatory would remove the Foley catheter.  She can follow-up with Dr. Margo Aye in [facility] urology.   Thank you for involving me in this patient's care, Please page with any further questions or concerns. Crist Fat

## 2023-06-14 NOTE — Assessment & Plan Note (Addendum)
 Patient more awake and alert this morning on reported prior encounters.  Daughter does report at bedside today that patient frequently gets confused and sometimes tries to get up and move around without asking for help. - Continue to monitor mentation - Caution with sedating meds - Continue holding home Klonopin for now

## 2023-06-14 NOTE — Assessment & Plan Note (Addendum)
 Most recent troponin downtrending 113.  Most recent EKG WNL.  Prior team spoke with cardiology who suspected demand ischemia. Hgb stable at 10.3. - No need to continue trending troponin

## 2023-06-14 NOTE — Plan of Care (Signed)
  Problem: Nutritional: Goal: Maintenance of adequate nutrition will improve Outcome: Progressing   Problem: Skin Integrity: Goal: Risk for impaired skin integrity will decrease Outcome: Progressing   Problem: Elimination: Goal: Will not experience complications related to bowel motility Outcome: Progressing

## 2023-06-14 NOTE — Progress Notes (Signed)
 Daily Progress Note Intern Pager: [phone]  Patient name: Danielle [name] Medical record number: [id number] Date of birth: Jan 13, 1934 Age: 89 y.o. Gender: female  Primary Care Provider: Gordan Payment., MD Consultants: None Code Status: DNR-limited  Pt Overview and Major Events to Date:  3/15-admitted  Assessment and Plan: [name] is an 89 year old who presented with acute on chronic respiratory failure with hypoxemia and fall, found to be influenza A+.  Incidentally also with frank blood in urine after patient received Foley.  Assessment & Plan Acute on chronic respiratory failure with hypoxemia (HCC)  Influenza A This AM saturating 99% on 2L which is home baseline. Repeat VBG normalizing.  Procal, WBCs WNL and patient has remained afebrile.  Hypoxemia very likely secondary to influenza infection. - DuoNeb BID - Continue home Dulera 2 puffs BID -Albuterol neb q4 PRN - Tamiflu BID, renally dosed (3/15 - ) - Follow-up blood cultures - AM CBC, CMP Fall  R breast pain  R knee pain Right knee x-ray and CXR both without evidence of fracture.  Patient continues to endorse severe pain to right breast this AM and asks for pain medicine.  Denies pain to right knee. - Orthostatic vital signs - Monitor fluid status - PT/OT - Tylenol 650 mg q6 scheduled -Add oxycodone 2.5 mg q8 PRN for severe pain Hematuria, gross Patient with difficulty reaching the bedside commode and trouble with voiding Foley.  Bladder scan with 350 mL retention after attempted void overnight.  Night team ultimately placed Foley catheter to help with retention.  This morning patient noted to have frank blood in Foley bag, wine red, though without any apparent clots in bag or catheter tubing.  Paged on-call urology Dr. Marlou Porch for consult.  Urology will plan to see the patient tomorrow.  Urology instructions as below: - Collect urine culture from prior UA - Continue to encourage patient OOB and once she  is able to make it to the commode on her own plan to remove the Foley as soon as possible Elevated troponin Most recent troponin downtrending 113.  Most recent EKG WNL.  Prior team spoke with cardiology who suspected demand ischemia. Hgb stable at 10.3. - No need to continue trending troponin Somnolence Patient more awake and alert this morning on reported prior encounters.  Daughter does report at bedside today that patient frequently gets confused and sometimes tries to get up and move around without asking for help. - Continue to monitor mentation - Caution with sedating meds - Continue holding home Klonopin for now Type 2 diabetes mellitus (HCC) A1c 7.4.  CBG this morning 150. Home regimen includes glimepiride 4 mg daily, metformin 500 mg daily, and Januvia 100 mg daily.  Continue holding home medications while admitted, will need this reconciled prior to discharge. -Sensitive SSI; augment as needed - CBGs for meals and at bedtime Elevated transaminase level AST, ALT still mildly elevated but essentially unchanged.  Continue to suspect this is related to influenza infection. - Continue to monitor - AM CMP Chronic health problem CHF-continue spironolactone 12.5 mg daily, holding home Lasix Atrial fibrillation-continue home amiodarone 200 mg, Eliquis 5 mg twice daily Neuropathy-continue gabapentin 100 mg at bedtime Allergies-continue Claritin 10 mg daily GERD-continue pantoprazole 40 mg twice daily Restless leg-continue ropinirole 0.25 mg at bedtime Hyperlipidemia-continue Crestor 40 mg daily Constipation-continue senna 17.2 mg at bedtime, add MiraLAX   FEN/GI: Carb modified diet PPx: Home Eliquis 5mg  BID Dispo:Home pending clinical improvement . Barriers include clinical stability.  Subjective:  Patient seen this morning lying in bed with daughter at bedside.  Patient endorses significant right breast pain.  She is breathing better and now on her home baseline 2 L.  She denies  any other concerns at this time.  Objective: Temp:  [97.6 F (36.4 C)-98.4 F (36.9 C)] 97.6 F (36.4 C) (03/16 0406) Pulse Rate:  [56-60] 58 (03/16 0831) Resp:  [16-20] 16 (03/16 0406) BP: (115-151)/(49-132) 137/54 (03/16 0831) SpO2:  [94 %-100 %] 99 % (03/16 0831) Physical Exam: General: Frail appearing, NAD Cardiovascular: RRR, no murmurs Respiratory: CTA anteriorly, some referred upper airway sounds.  No increased work of breathing on 2 L. Abdomen: Normoactive bowel sounds.  Soft, nontender, nondistended. Extremities: Moves all equally.  No LE edema bilaterally.  Laboratory: Most recent CBC Lab Results  Component Value Date   WBC 4.9 06/14/2023   HGB 10.3 (L) 06/14/2023   HCT 32.1 (L) 06/14/2023   MCV 94.6 06/14/2023   PLT 167 06/14/2023   Most recent BMP    Latest Ref Rng & Units 06/14/2023    7:24 AM  BMP  Glucose 70 - 99 mg/dL 161   BUN 8 - 23 mg/dL 35   Creatinine 0.96 - 1.00 mg/dL 0.45   Sodium 409 - 811 mmol/L 141   Potassium 3.5 - 5.1 mmol/L 4.1   Chloride 98 - 111 mmol/L 100   CO2 22 - 32 mmol/L 27   Calcium 8.9 - 10.3 mg/dL 8.7     9/14 right knee x-ray: No acute osseous abnormality of the right knee  3/15 CXR: No active disease  Danielle Skeeters, DO 06/14/2023, 1:28 PM  PGY-1, [facility] FPTS Intern pager: [phone], text pages welcome Secure chat group [facility] Teaching Service

## 2023-06-14 NOTE — Assessment & Plan Note (Signed)
 Patient with difficulty reaching the bedside commode and trouble with voiding Foley.  Bladder scan with 350 mL retention after attempted void overnight.  Night team ultimately placed Foley catheter to help with retention.  This morning patient noted to have frank blood in Foley bag, wine red, though without any apparent clots in bag or catheter tubing.  Paged on-call urology Dr. Marlou Porch for consult.  Urology will plan to see the patient tomorrow.  Urology instructions as below: - Collect urine culture from prior UA - Continue to encourage patient OOB and once she is able to make it to the commode on her own plan to remove the Foley as soon as possible

## 2023-06-14 NOTE — Care Plan (Signed)
 Renal artery ultrasound ordered to evaluate for renal contusion.  Darral Dash, DO

## 2023-06-14 NOTE — Evaluation (Signed)
 Occupational Therapy Evaluation Patient Details Name: Danielle [name] MRN: [id number] DOB: [birth year] Today's Date: 06/14/2023   History of Present Illness   89 year old adm 3/15 who presented with acute on chronic respiratory failure with hypoxemia and mechanical fall, found to be influenza A+.  PMHx: COPD, chronic respiratory failure with hypoxia on home oxygen, coronary artery disease, morbid obesity, diabetes mellitus type 2, chronic kidney disease stage IIIa, hyperlipidemia.     Clinical Impressions Patient admitted for the diagnosis above.  PTA she lives alone with occasional check in from daughter.  Daughter brings meals, assists with medication and provides community mobility.  Patient reports Mod I with ADL and light meal prep.  Generally walks with RW.  O2 is for nighttime.  Pain, decreased activity tolerance and poor dynamic balance are the deficits.  Currently she is needing up to Min A for basic mobility and up to Max A for lower body ADL from a sit to stand level.  OT will follow in the acute setting to address deficits, and Patient will benefit from continued inpatient follow up therapy, <3 hours/day.     If plan is discharge home, recommend the following:   A lot of help with bathing/dressing/bathroom;A lot of help with walking and/or transfers;Assistance with cooking/housework     Functional Status Assessment   Patient has had a recent decline in their functional status and demonstrates the ability to make significant improvements in function in a reasonable and predictable amount of time.     Equipment Recommendations   None recommended by OT     Recommendations for Other Services         Precautions/Restrictions   Precautions Precautions: Fall Precaution/Restrictions Comments: Watch O2 sats Restrictions Weight Bearing Restrictions Per Provider Order: No     Mobility Bed Mobility Overal bed mobility: Needs Assistance Bed Mobility: Supine to  Sit     Supine to sit: Min assist, HOB elevated       Patient Response: Cooperative  Transfers Overall transfer level: Needs assistance Equipment used: Rolling walker (2 wheels) Transfers: Sit to/from Stand, Bed to chair/wheelchair/BSC Sit to Stand: Min assist     Step pivot transfers: Contact guard assist            Balance Overall balance assessment: Needs assistance Sitting-balance support: Feet supported Sitting balance-Leahy Scale: Fair     Standing balance support: Reliant on assistive device for balance Standing balance-Leahy Scale: Poor                             ADL either performed or assessed with clinical judgement   ADL Overall ADL's : Needs assistance/impaired Eating/Feeding: Independent;Sitting   Grooming: Wash/dry hands;Wash/dry face;Set up;Sitting   Upper Body Bathing: Minimal assistance;Sitting   Lower Body Bathing: Maximal assistance;Sit to/from stand   Upper Body Dressing : Moderate assistance;Sitting   Lower Body Dressing: Maximal assistance;Sit to/from stand   Toilet Transfer: Minimal assistance;BSC/3in1;Stand-pivot;Rolling walker (2 wheels)                   Vision Patient Visual Report: No change from baseline       Perception Perception: Within Functional Limits       Praxis Praxis: WFL       Pertinent Vitals/Pain Pain Assessment Pain Assessment: Faces Faces Pain Scale: Hurts even more Pain Location: R breast and R knee Pain Descriptors / Indicators: Tender, Sore Pain Intervention(s): Monitored during session     Extremity/Trunk Assessment  Upper Extremity Assessment Upper Extremity Assessment: Generalized weakness   Lower Extremity Assessment Lower Extremity Assessment: Defer to PT evaluation   Cervical / Trunk Assessment Cervical / Trunk Assessment: Normal   Communication Communication Communication: No apparent difficulties   Cognition Arousal: Alert Behavior During Therapy: WFL for  tasks assessed/performed Cognition: No apparent impairments                               Following commands: Intact       Cueing  General Comments   Cueing Techniques: Verbal cues   HR to 108 with mobility, good O2 sats with supplemental O2   Exercises     Shoulder Instructions      Home Living Family/patient expects to be discharged to:: Private residence Living Arrangements: Alone Available Help at Discharge: Family;Available PRN/intermittently Type of Home: House Home Access: Stairs to enter [employer] of Steps: 2 Entrance Stairs-Rails: None Home Layout: One level     Bathroom Shower/Tub: Producer, television/film/video: Standard Bathroom Accessibility: Yes   Home Equipment: Agricultural consultant (2 wheels);Rollator (4 wheels);Cane - single point;BSC/3in1;Wheelchair - manual;Grab bars - toilet;Grab bars - tub/shower;Shower seat   Additional Comments: daugther can stay 24/7 if needed but pt would prefer her to just check on her.  Can't physically lift her.      Prior Functioning/Environment Prior Level of Function : History of Falls (last six months);Needs assist       Physical Assist : Mobility (physical);ADLs (physical) Mobility (physical): Transfers;Stairs ADLs (physical): IADLs Mobility Comments: RW for short distances. Pt sleeps in regular recliner.  Has a lift recliner, but does not like it.  Family always assists with stairs ADLs Comments: Mod I with ADLs, pt manages breakfast at RW level.  Daughter assists with remainder of meals.  Not driving, daughter assists with medications.    OT Problem List: Decreased strength;Decreased range of motion;Decreased activity tolerance;Impaired balance (sitting and/or standing);Pain   OT Treatment/Interventions: Self-care/ADL training;Therapeutic activities;Therapeutic exercise;Patient/family education;DME and/or AE instruction;Balance training;Energy conservation      OT Goals(Current goals  can be found in the care plan section)   Acute Rehab OT Goals Patient Stated Goal: Return home OT Goal Formulation: With patient Time For Goal Achievement: 06/29/23 Potential to Achieve Goals: Good ADL Goals Pt Will Perform Grooming: with modified independence;standing Pt Will Perform Upper Body Bathing: with modified independence;sitting Pt Will Perform Lower Body Bathing: with supervision;sit to/from stand Pt Will Perform Upper Body Dressing: with modified independence;sitting Pt Will Perform Lower Body Dressing: with supervision;sit to/from stand Pt Will Transfer to Toilet: with modified independence;ambulating;regular height toilet Pt/caregiver will Perform Home Exercise Program: Increased strength;Both right and left upper extremity;With theraband;With Supervision   OT Frequency:  Min 1X/week    Co-evaluation              AM-PAC OT "6 Clicks" Daily Activity     Outcome Measure Help from another person eating meals?: None Help from another person taking care of personal grooming?: A Little Help from another person toileting, which includes using toliet, bedpan, or urinal?: A Lot Help from another person bathing (including washing, rinsing, drying)?: A Lot Help from another person to put on and taking off regular upper body clothing?: A Lot Help from another person to put on and taking off regular lower body clothing?: A Lot 6 Click Score: 15   End of Session Equipment Utilized During Treatment: Rolling walker (2 wheels);Oxygen Nurse  Communication: Mobility status  Activity Tolerance: Patient tolerated treatment well Patient left: in chair;with call bell/phone within reach;with chair alarm set  OT Visit Diagnosis: Unsteadiness on feet (R26.81);Muscle weakness (generalized) (M62.81);History of falling (Z91.81);Pain Pain - Right/Left: Right Pain - part of body: Knee                Time: 0960-4540 OT Time Calculation (min): 23 min Charges:  OT General Charges $OT  Visit: 1 Visit OT Evaluation $OT Eval Moderate Complexity: 1 Mod OT Treatments $Self Care/Home Management : 8-22 mins  06/14/2023  RP, OTR/L  Acute Rehabilitation Services  Office:  [phone]   Suzanna Obey 06/14/2023, 2:40 PM

## 2023-06-14 NOTE — Assessment & Plan Note (Addendum)
 CHF-continue spironolactone 12.5 mg daily, holding home Lasix Atrial fibrillation-continue home amiodarone 200 mg, Eliquis 5 mg twice daily Neuropathy-continue gabapentin 100 mg at bedtime Allergies-continue Claritin 10 mg daily GERD-continue pantoprazole 40 mg twice daily Restless leg-continue ropinirole 0.25 mg at bedtime Hyperlipidemia-continue Crestor 40 mg daily Constipation-continue senna 17.2 mg at bedtime, add MiraLAX

## 2023-06-14 NOTE — Assessment & Plan Note (Addendum)
 This AM saturating 99% on 2L which is home baseline. Repeat VBG normalizing.  Procal, WBCs WNL and patient has remained afebrile.  Hypoxemia very likely secondary to influenza infection. - DuoNeb BID - Continue home Dulera 2 puffs BID -Albuterol neb q4 PRN - Tamiflu BID, renally dosed (3/15 - ) - Follow-up blood cultures - AM CBC, CMP

## 2023-06-14 NOTE — Assessment & Plan Note (Addendum)
 AST, ALT still mildly elevated but essentially unchanged.  Continue to suspect this is related to influenza infection. - Continue to monitor - AM CMP

## 2023-06-14 NOTE — Assessment & Plan Note (Addendum)
 Right knee x-ray and CXR both without evidence of fracture.  Patient continues to endorse severe pain to right breast this AM and asks for pain medicine.  Denies pain to right knee. - Orthostatic vital signs - Monitor fluid status - PT/OT - Tylenol 650 mg q6 scheduled -Add oxycodone 2.5 mg q8 PRN for severe pain

## 2023-06-14 NOTE — Evaluation (Signed)
 Physical Therapy Evaluation Patient Details Name: Danielle [name] MRN: [id number] DOB: 10/04/1933 Today's Date: 06/14/2023  History of Present Illness  89 year old female who presented 3/15 with R leg and breast pain s/p mechanical fall. Admitted with acute on chronic respiratory failure with hypoxemia and found to be influenza A+.  PMHx: COPD, chronic respiratory failure with hypoxia on home oxygen, coronary artery disease, morbid obesity, diabetes mellitus type 2, chronic kidney disease stage IIIa, hyperlipidemia.  Clinical Impression  Pt is a 89 y.o. female presenting on 06/13/23 with above conditions and deficits below, see PT Problem List. PTA pt lived alone in an one level home, had 2 STE, had a hx of falls, received assistance from her daughter for medications, meals, and transportation, and used a RW for short distance ambulation. Currently pt is mod A for bed mobility, min A- CGA for transfers, and CGA for gait with RW. Pt displays deficits in LE strength & AROM, activity tolerance, functional mobility, cognition, gait, and dynamic/static balance and would benefit from continued acute PT to address these impairments. Given pt's current condition, living situation, and PRN support, pt would benefit from short term rehab < 3 hours to address impairments listed above. Pt would benefit from continued mobility with nurses and mobility specialists until d/c. Will continue to follow acutely.             If plan is discharge home, recommend the following: A little help with walking and/or transfers;A little help with bathing/dressing/bathroom;Assistance with cooking/housework;Direct supervision/assist for medications management;Direct supervision/assist for financial management;Assist for transportation;Help with stairs or ramp for entrance   Can travel by private vehicle   Yes    Equipment Recommendations None recommended by PT  Recommendations for Other Services       Functional  Status Assessment Patient has had a recent decline in their functional status and/or demonstrates limited ability to make significant improvements in function in a reasonable and predictable amount of time     Precautions / Restrictions Precautions Precautions: Fall Recall of Precautions/Restrictions: Intact Precaution/Restrictions Comments: Watch O2 sats Restrictions Weight Bearing Restrictions Per Provider Order: No      Mobility  Bed Mobility Overal bed mobility: Needs Assistance Bed Mobility: Sit to Supine       Sit to supine: Mod assist, Used rails   General bed mobility comments: mod A needed for LE management and trunk positioning    Transfers Overall transfer level: Needs assistance Equipment used: Rolling walker (2 wheels) Transfers: Sit to/from Stand, Bed to chair/wheelchair/BSC Sit to Stand: Min assist   Step pivot transfers: Contact guard assist       General transfer comment: step pivot after walking to [facility], CGA and cueing for hand placement. Min A needed to power up from recliner. Pt stands with increased trunk flexion and requires cueing for hand placement. Pt performs x2 sit to stands, one from recliner, one from [facility]    Ambulation/Gait Ambulation/Gait assistance: Contact guard assist, Min assist Gait Distance (Feet): 15 Feet Assistive device: Rolling walker (2 wheels) Gait Pattern/deviations: Step-through pattern, Decreased stride length, Shuffle, Trunk flexed, Wide base of support Gait velocity: reduced Gait velocity interpretation: <1.31 ft/sec, indicative of household ambulator   General Gait Details: min A for RW management, maintaining upright trunk posture and staying within the RW, esp during turns  Stairs            Wheelchair Mobility     Tilt Bed    Modified Rankin (Stroke Patients Only)  Balance Overall balance assessment: Needs assistance Sitting-balance support: Single extremity supported, Feet supported Sitting  balance-Leahy Scale: Fair Sitting balance - Comments: pt sits in recliner, BSC, and EOB without LOB slight anterior lean   Standing balance support: Reliant on assistive device for balance, During functional activity, Bilateral upper extremity supported Standing balance-Leahy Scale: Poor Standing balance comment: pt reliant on RW for UE support. stands with increased trunk flexion.  no LOB                             Pertinent Vitals/Pain Pain Assessment Pain Assessment: Faces Faces Pain Scale: Hurts even more Pain Location: R breast and R knee Pain Descriptors / Indicators: Grimacing, Guarding, Moaning, Sore Pain Intervention(s): Monitored during session    Home Living Family/patient expects to be discharged to:: Private residence Living Arrangements: Alone Available Help at Discharge: Family;Available PRN/intermittently Type of Home: House Home Access: Stairs to enter Entrance Stairs-Rails: None Entrance Stairs-Number of Steps: 2   Home Layout: One level Home Equipment: Agricultural consultant (2 wheels);Rollator (4 wheels);Cane - single point;BSC/3in1;Wheelchair - manual;Grab bars - toilet;Grab bars - tub/shower;Shower seat Additional Comments: daugther can stay 24/7 if needed but pt would prefer her to just check on her.  Can't physically lift her.    Prior Function Prior Level of Function : History of Falls (last six months);Needs assist       Physical Assist : Mobility (physical);ADLs (physical) Mobility (physical): Transfers;Stairs ADLs (physical): IADLs Mobility Comments: RW for short distances. Pt sleeps in regular recliner.  Has a lift recliner, but does not like it.  Family always assists with stairs ADLs Comments: Mod I with ADLs, pt manages breakfast at RW level.  Daughter assists with remainder of meals.  Not driving, daughter assists with medications.     Extremity/Trunk Assessment   Upper Extremity Assessment Upper Extremity Assessment: Defer to OT  evaluation    Lower Extremity Assessment Lower Extremity Assessment: Generalized weakness;RLE deficits/detail;LLE deficits/detail RLE Deficits / Details: unable to fully extend knees against gravity RLE Coordination: decreased gross motor LLE Deficits / Details: unable to fully extend knees against gravity LLE Coordination: decreased gross motor    Cervical / Trunk Assessment Cervical / Trunk Assessment: Normal  Communication   Communication Communication: No apparent difficulties Factors Affecting Communication: Other (comment) (soft spoken)    Cognition Arousal: Alert Behavior During Therapy: WFL for tasks assessed/performed   PT - Cognitive impairments: Safety/Judgement                         Following commands: Intact       Cueing Cueing Techniques: Verbal cues     General Comments      Exercises     Assessment/Plan    PT Assessment Patient needs continued PT services  PT Problem List Decreased strength;Decreased range of motion;Decreased activity tolerance;Decreased balance;Decreased mobility;Decreased coordination;Decreased knowledge of use of DME;Decreased safety awareness;Cardiopulmonary status limiting activity;Pain       PT Treatment Interventions DME instruction;Gait training;Stair training;Functional mobility training;Therapeutic activities;Therapeutic exercise;Balance training;Neuromuscular re-education;Cognitive remediation;Patient/family education;Wheelchair mobility training    PT Goals (Current goals can be found in the Care Plan section)  Acute Rehab PT Goals Patient Stated Goal: improve PT Goal Formulation: With patient/family Time For Goal Achievement: 06/28/23 Potential to Achieve Goals: Fair    Frequency Min 2X/week     Co-evaluation               AM-PAC  PT "6 Clicks" Mobility  Outcome Measure Help needed turning from your back to your side while in a flat bed without using bedrails?: A Lot Help needed moving from  lying on your back to sitting on the side of a flat bed without using bedrails?: A Lot Help needed moving to and from a bed to a chair (including a wheelchair)?: A Little Help needed standing up from a chair using your arms (e.g., wheelchair or bedside chair)?: A Little Help needed to walk in hospital room?: Total (<20 ft) Help needed climbing 3-5 steps with a railing? : Total 6 Click Score: 12    End of Session Equipment Utilized During Treatment: Gait belt;Oxygen Activity Tolerance: Patient limited by fatigue;Patient limited by pain Patient left: in bed;with call bell/phone within reach;with bed alarm set;with family/visitor present Nurse Communication: Other (comment) (notified MD of pt's black stool) PT Visit Diagnosis: Unsteadiness on feet (R26.81);Other abnormalities of gait and mobility (R26.89);Muscle weakness (generalized) (M62.81);History of falling (Z91.81);Difficulty in walking, not elsewhere classified (R26.2);Pain Pain - Right/Left: Right Pain - part of body: Knee (R breast area)    Time: 1914-7829 PT Time Calculation (min) (ACUTE ONLY): 22 min   Charges:   PT Evaluation $PT Eval Moderate Complexity: 1 Mod   PT General Charges $$ ACUTE PT VISIT: 1 Visit        [address], SPT   [place] 06/14/2023, 6:13 PM

## 2023-06-15 ENCOUNTER — Inpatient Hospital Stay (HOSPITAL_COMMUNITY)

## 2023-06-15 DIAGNOSIS — R7401 Elevation of levels of liver transaminase levels: Secondary | ICD-10-CM | POA: Diagnosis not present

## 2023-06-15 DIAGNOSIS — W19XXXA Unspecified fall, initial encounter: Secondary | ICD-10-CM | POA: Diagnosis not present

## 2023-06-15 DIAGNOSIS — J101 Influenza due to other identified influenza virus with other respiratory manifestations: Secondary | ICD-10-CM

## 2023-06-15 DIAGNOSIS — R339 Retention of urine, unspecified: Secondary | ICD-10-CM

## 2023-06-15 DIAGNOSIS — N644 Mastodynia: Secondary | ICD-10-CM | POA: Diagnosis not present

## 2023-06-15 DIAGNOSIS — J9621 Acute and chronic respiratory failure with hypoxia: Secondary | ICD-10-CM

## 2023-06-15 LAB — CBC
HCT: 32.3 % — ABNORMAL LOW (ref 36.0–46.0)
Hemoglobin: 10.2 g/dL — ABNORMAL LOW (ref 12.0–15.0)
MCH: 29.8 pg (ref 26.0–34.0)
MCHC: 31.6 g/dL (ref 30.0–36.0)
MCV: 94.4 fL (ref 80.0–100.0)
Platelets: 174 10*3/uL (ref 150–400)
RBC: 3.42 MIL/uL — ABNORMAL LOW (ref 3.87–5.11)
RDW: 15.7 % — ABNORMAL HIGH (ref 11.5–15.5)
WBC: 5.8 10*3/uL (ref 4.0–10.5)
nRBC: 0 % (ref 0.0–0.2)

## 2023-06-15 LAB — URINE CULTURE: Culture: <10000 — AB

## 2023-06-15 LAB — GLUCOSE, CAPILLARY
Glucose-Capillary: 234 mg/dL — ABNORMAL HIGH (ref 70–99)
Glucose-Capillary: 275 mg/dL — ABNORMAL HIGH (ref 70–99)
Glucose-Capillary: 145 mg/dL — ABNORMAL HIGH (ref 70–99)
Glucose-Capillary: 274 mg/dL — ABNORMAL HIGH (ref 70–99)
Glucose-Capillary: 374 mg/dL — ABNORMAL HIGH (ref 70–99)
Glucose-Capillary: 115 mg/dL — ABNORMAL HIGH (ref 70–99)

## 2023-06-15 LAB — BASIC METABOLIC PANEL
Anion gap: 9 (ref 5–15)
BUN: 33 mg/dL — ABNORMAL HIGH (ref 8–23)
CO2: 26 mmol/L (ref 22–32)
Calcium: 8.4 mg/dL — ABNORMAL LOW (ref 8.9–10.3)
Chloride: 102 mmol/L (ref 98–111)
Creatinine, Ser: 1.02 mg/dL — ABNORMAL HIGH (ref 0.44–1.00)
GFR, Estimated: 53 mL/min — ABNORMAL LOW (ref >60)
Glucose, Bld: 187 mg/dL — ABNORMAL HIGH (ref 70–99)
Potassium: 4.3 mmol/L (ref 3.5–5.1)
Sodium: 137 mmol/L (ref 135–145)

## 2023-06-15 LAB — HEPATIC FUNCTION PANEL
ALT: 60 U/L — ABNORMAL HIGH (ref 0–44)
AST: 56 U/L — ABNORMAL HIGH (ref 15–41)
Albumin: 3.2 g/dL — ABNORMAL LOW (ref 3.5–5.0)
Alkaline Phosphatase: 43 U/L (ref 38–126)
Bilirubin, Direct: 0.1 mg/dL (ref 0.0–0.2)
Indirect Bilirubin: 0.4 mg/dL (ref 0.3–0.9)
Total Bilirubin: 0.5 mg/dL (ref 0.0–1.2)
Total Protein: 6 g/dL — ABNORMAL LOW (ref 6.5–8.1)

## 2023-06-15 MED ORDER — LINAGLIPTIN 5 MG PO TABS
5.0000 mg | ORAL_TABLET | Freq: Every day | ORAL | Status: DC
  Administered 2023-06-15 – 2023-06-20 (×6): 5 mg via ORAL
  Filled 2023-06-15 (×6): qty 1

## 2023-06-15 MED ORDER — SULFAMETHOXAZOLE-TRIMETHOPRIM 400-80 MG PO TABS
1.0000 | ORAL_TABLET | Freq: Every day | ORAL | Status: DC
Start: 2023-06-15 — End: 2023-06-20
  Administered 2023-06-15 – 2023-06-19 (×5): 1 via ORAL
  Filled 2023-06-15 (×6): qty 1

## 2023-06-15 MED ORDER — ONDANSETRON 4 MG PO TBDP
4.0000 mg | ORAL_TABLET | Freq: Three times a day (TID) | ORAL | Status: DC | PRN
  Administered 2023-06-18 (×2): 4 mg via ORAL
  Filled 2023-06-15 (×3): qty 1

## 2023-06-15 MED ORDER — HEPARIN SODIUM (PORCINE) 5000 UNIT/ML IJ SOLN
5000.0000 [IU] | Freq: Three times a day (TID) | INTRAMUSCULAR | Status: DC
Start: 2023-06-15 — End: 2023-06-16
  Administered 2023-06-15 – 2023-06-16 (×3): 5000 [IU] via SUBCUTANEOUS
  Filled 2023-06-15 (×3): qty 1

## 2023-06-15 NOTE — Assessment & Plan Note (Addendum)
 Remains on baseline 2L [place], medically improving for discharge. -DuoNeb BID, albuterol neb Q4h PRN -Continue home Dulera 2 puffs BID -Tamiflu BID, renally dosed x5 days (3/15-3/19) -OOB to chair for meals -Follow-up blood cultures -AM CMP

## 2023-06-15 NOTE — Assessment & Plan Note (Addendum)
 Right knee x-ray and CXR both without evidence of fracture.  Patient continues to endorse severe pain to right breast this AM. -Orthostatic vital signs per RN -PT/OT eval and treat -Tylenol 650 mg Q6h [facility] -Oxycodone 2.5 mg Q8 PRN for severe pain

## 2023-06-15 NOTE — Plan of Care (Signed)
 FMTS Brief Progress Note  S: Noticed patient's last documented heart rate was 140.  Evaluated patient at bedside with Dr. Melissa Noon. HR was 139. She is complaining of pain in her right breast, unchanged from right breast pain she has had throughout admission.  Blood pressure cuff was on patient's left forearm.  Move blood pressure cuff to left bicep, repeated vitals which improved to 128/53 and heart rate of 64.  She was saturating well on her baseline 2 L nasal cannula.   O: BP (!) 128/53 (BP Location: Right Arm)   Pulse 64   Temp 98 F (36.7 C) (Oral)   Resp (!) 24   Ht 5\' 3"  (1.6 m)   Wt 108 kg   SpO2 95%   BMI 42.16 kg/m   GEN: Chronically ill-appearing, sitting in chair bedside in no acute distress CV: Regular rate and rhythm, no murmurs Respiratory: Exam limited secondary to habitus however no wheezing or crackles auscultated.  Not in acute respiratory distress. MSK: Bruising to bilateral upper extremities.  Tenderness to palpation over right breast, no bruising or sign of infection noted.   A/P: Tachycardia This has resolved, however given right breast pain will obtain EKG.  I suspect her pain is musculoskeletal in nature due to her fall and it is reproducible. - Placed back on cardiac telemetry - Oxycodone 2.5 mg for breast pain - Have considered PE, if her respiratory status changes or she becomes persistently tachycardic will order CTA PE - If EKG concerning, or pain continues we will consider troponins   - Orders reviewed. Labs for AM ordered, which was adjusted as needed.  - If condition changes, plan includes reevaluation, chest x-ray, troponins, CTA PE.   Para March, DO 06/15/2023, 9:34 PM PGY-1, [facility] Night Resident  Please page [phone] with questions.

## 2023-06-15 NOTE — Assessment & Plan Note (Addendum)
 CHF - Holding home spironolactone 12.5 mg daily, Lasix Atrial fibrillation - Continue home amiodarone 200 mg, hold Eliquis 5 mg twice daily Neuropathy - Continue gabapentin 100 mg at bedtime Allergies - Continue Claritin 10 mg daily GERD - Continue pantoprazole 40 mg twice daily Restless leg - Continue ropinirole 0.25 mg at bedtime Hyperlipidemia - Continue rosuvastatin 40 mg daily Constipation - Continue senna 17.2 mg at bedtime, MiraLAX daily

## 2023-06-15 NOTE — Progress Notes (Addendum)
 Daily Progress Note Intern Pager: [phone]  Patient name: Danielle [name] Medical record number: [id number] Date of birth: [birth year] Age: 89 y.o. Gender: female  Primary Care Provider: Gordan Payment., MD Consultants: None Code Status: DNR limited  Pt Overview and Major Events to Date:  3/15 - Admitted, Foley placed  Assessment and Plan: Danielle [name] is a 89 y.o. female with a pertinent PMH of T2DM, CHF, OSA, CKD 3A, A-fib who presented with AHRF in setting of influenza infection, fall at home and was admitted for respiratory support, currently working on dispo with respiratory status back to baseline.  Hemoglobin is stable.  Will pursue shared decision making with family concerning anticoagulation given history of DVT and A-fib.  Patient does have frank hematuria, and urology discussed discontinuing anticoagulation in note.  However, upon further consult today, stated that given unprovoked DVT hx, it would be appropriate to trial heparin prior to transition back to Eliquis if no active bleed. Assessment & Plan Acute on chronic respiratory failure with hypoxemia (HCC)  Influenza A Remains on baseline 2L [place], medically improving for discharge. -DuoNeb BID, albuterol neb Q4h PRN -Continue home Dulera 2 puffs BID -Tamiflu BID, renally dosed x5 days (3/15-3/19) -OOB to chair for meals -Follow-up blood cultures -AM CMP Hematuria, gross Continues to have hematuria.  Hgb stable.  Weighing risks and benefits of anticoagulation with family. -Renal US pending to evaluate for renal contusion -AM CBC -Urology consulted, appreciate recommendations: -Discussed anticoagulation; can trial heparin and then transition to Eliquis if no active bleed -No Foley removal today Fall  R breast pain  R knee pain Right knee x-ray and CXR both without evidence of fracture.  Patient continues to endorse severe pain to right breast this AM. -Orthostatic vital signs per RN -PT/OT eval and  treat -Tylenol 650 mg Q6h [facility] -Oxycodone 2.5 mg Q8 PRN for severe pain Type 2 diabetes mellitus (HCC) A1c 7.4.  CBGs ranging in 200s, 11 units SA past 24 hours. Home regimen: Glimepiride 4 mg daily, metformin 500 mg daily, and Januvia 100 mg daily. -Increased to mSSI overnight -Add linagliptin 5 mg daily -CBGs ACHS Elevated transaminase level AST, ALT still mildly elevated but stable.  Continue to suspect this is related to influenza infection. -Continue to monitor -Hepatitis panel -AM CMP Chronic health problem CHF - Holding home spironolactone 12.5 mg daily, Lasix Atrial fibrillation - Continue home amiodarone 200 mg, hold Eliquis 5 mg twice daily Neuropathy - Continue gabapentin 100 mg at bedtime Allergies - Continue Claritin 10 mg daily GERD - Continue pantoprazole 40 mg twice daily Restless leg - Continue ropinirole 0.25 mg at bedtime Hyperlipidemia - Continue rosuvastatin 40 mg daily Constipation - Continue senna 17.2 mg at bedtime, MiraLAX daily  FEN/GI: Carb modified diet PPx: Home Eliquis 5 mg BID held per Urology pending discussion with attending Dispo: SNF pending clinical improvement . Barriers include respiratory status, hematuria workup.  Subjective:  Additional collateral from daughter obtained at bedside, patient has been taken Bactrim daily for UTI suppression and Klonopin nightly.  Daughter does manage the patient's medicines at home.  Also confirms that patient has had multiple episodes of hematuria intermittently prior to admission.  Objective: Temp:  [97.9 F (36.6 C)-98.2 F (36.8 C)] 97.9 F (36.6 C) (03/17 0432) Pulse Rate:  [58-68] 62 (03/17 0432) Resp:  [17-20] 20 (03/17 0432) BP: (110-137)/(50-64) 124/64 (03/17 0432) SpO2:  [94 %-100 %] 100 % (03/17 0432)  Physical Exam: General: Deconditioned/frail patient, resting comfortably in  chair at bedside, NAD, alert to self and place, forgetful during conversation. Cardiovascular: Normal rate and  irregular rhythm. Normal S1/S2. No murmurs, rubs, or gallops appreciated. 2+ radial pulses. Pulmonary: Clear bilaterally to ascultation. No increased WOB, no accessory muscle usage on 2L [place]. No wheezes, crackles, or rhonchi. Abdominal: Normoactive bowel sounds, nondistended. No tenderness to deep or light palpation. No rebound or guarding. GU: Foley in place, draining dark red urine, no clots visible.  No suprapubic tenderness. Skin: Scattered purpura and ecchymoses over thin, papery skin.  Small abrasion over R knee covered with adhesive pad. Extremities: 2+ pitting peripheral edema bilaterally.  Capillary refill <2 seconds.  Laboratory: Most recent CBC Lab Results  Component Value Date   WBC 5.0 06/14/2023   HGB 10.5 (L) 06/14/2023   HCT 32.9 (L) 06/14/2023   MCV 95.6 06/14/2023   PLT 172 06/14/2023   Most recent BMP    Latest Ref Rng & Units 06/14/2023    7:24 AM  BMP  Glucose 70 - 99 mg/dL 409   BUN 8 - 23 mg/dL 35   Creatinine 8.11 - 1.00 mg/dL 9.14   Sodium 782 - 956 mmol/L 141   Potassium 3.5 - 5.1 mmol/L 4.1   Chloride 98 - 111 mmol/L 100   CO2 22 - 32 mmol/L 27   Calcium 8.9 - 10.3 mg/dL 8.7     Other pertinent labs:  CBG (last 3)  Recent Labs    06/14/23 1746 06/14/23 2119 06/15/23 0815  GLUCAP 217* 275* 145*   New Imaging/Diagnostic Tests: -Renal US: Pending  Dexter Sauser, MD 06/15/2023, 7:10 AM  PGY-1, [facility] FPTS Intern pager: [phone], text pages welcome Secure chat group [facility] Teaching Service

## 2023-06-15 NOTE — Assessment & Plan Note (Addendum)
 A1c 7.4.  CBGs ranging in 200s, 11 units SA past 24 hours. Home regimen: Glimepiride 4 mg daily, metformin 500 mg daily, and Januvia 100 mg daily. -Increased to mSSI overnight -Add linagliptin 5 mg daily -CBGs ACHS

## 2023-06-15 NOTE — Plan of Care (Signed)
 Spoke with on-call Urology. Okay to restart anticoag, given hx of unprovoked DVT. 2 options: Recommend starting heparin, then transitioning to DOAC if not bleeding or restart DOAC. We will discuss with FMTS attending.

## 2023-06-15 NOTE — Progress Notes (Signed)
 Occupational Therapy Treatment Patient Details Name: Danielle [name] MRN: [id number] DOB: 12-10-1933 Today's Date: 06/15/2023   History of present illness 89 year old female who presented 3/15 with R leg and breast pain s/p mechanical fall. Admitted with acute on chronic respiratory failure with hypoxemia and found to be influenza A+.  PMHx: COPD, chronic respiratory failure with hypoxia on home oxygen, coronary artery disease, morbid obesity, diabetes mellitus type 2, chronic kidney disease stage IIIa, hyperlipidemia.   OT comments  Pt seen for OT session, per request from nursing staff. Pt on BSC upon arrival, needing up to max A for seated ADLs, and min A for transfers/ambulation around bed to chair to sit up for breakfast with RW. VSS on 2L O2 throughout. Pt with incr difficulty with LB ADLs. Pt presenting with impairments listed below, will follow acutely. Patient will benefit from continued inpatient follow up therapy, <3 hours/day to maximize safety/ind with ADL/functional mobility.       If plan is discharge home, recommend the following:  A lot of help with bathing/dressing/bathroom;A lot of help with walking and/or transfers;Assistance with cooking/housework   Equipment Recommendations  None recommended by OT    Recommendations for Other Services      Precautions / Restrictions Precautions Precautions: Fall Recall of Precautions/Restrictions: Intact Precaution/Restrictions Comments: Watch O2 sats Restrictions Weight Bearing Restrictions Per Provider Order: No       Mobility Bed Mobility               General bed mobility comments: on BSC upon arrival    Transfers Overall transfer level: Needs assistance Equipment used: Rolling walker (2 wheels) Transfers: Sit to/from Stand, Bed to chair/wheelchair/BSC Sit to Stand: Min assist                 Balance Overall balance assessment: Needs assistance Sitting-balance support: Bilateral upper extremity  supported Sitting balance-Leahy Scale: Fair     Standing balance support: Reliant on assistive device for balance, During functional activity, Bilateral upper extremity supported Standing balance-Leahy Scale: Poor Standing balance comment: pt reliant on RW for UE support. stands with increased trunk flexion.  no LOB                           ADL either performed or assessed with clinical judgement   ADL Overall ADL's : Needs assistance/impaired     Grooming: Wash/dry hands;Sitting Grooming Details (indicate cue type and reason): seated prior to  meal             Lower Body Dressing: Maximal assistance;Sitting/lateral leans Lower Body Dressing Details (indicate cue type and reason): pulling up socks while seated on commode Toilet Transfer: Minimal assistance;Ambulation;Rolling walker (2 wheels);Regular Toilet   Toileting- Clothing Manipulation and Hygiene: Maximal assistance Toileting - Clothing Manipulation Details (indicate cue type and reason): pericare in standing            Extremity/Trunk Assessment Upper Extremity Assessment Upper Extremity Assessment: Generalized weakness   Lower Extremity Assessment Lower Extremity Assessment: Defer to PT evaluation        Vision   Vision Assessment?: No apparent visual deficits   Perception Perception Perception: Within Functional Limits   Praxis Praxis Praxis: WFL   Communication Communication Communication: No apparent difficulties   Cognition Arousal: Alert Behavior During Therapy: WFL for tasks assessed/performed Cognition: No apparent impairments  Following commands: Intact        Cueing   Cueing Techniques: Verbal cues  Exercises      Shoulder Instructions       General Comments VSS on 2L O2    Pertinent Vitals/ Pain       Pain Assessment Pain Assessment: Faces Pain Score: 6  Faces Pain Scale: Hurts even more Pain Location: R  breast Pain Descriptors / Indicators: Grimacing, Guarding, Moaning, Sore Pain Intervention(s): Limited activity within patient's tolerance, Monitored during session  Home Living                                          Prior Functioning/Environment              Frequency  Min 1X/week        Progress Toward Goals  OT Goals(current goals can now be found in the care plan section)  Progress towards OT goals: Progressing toward goals  Acute Rehab OT Goals Patient Stated Goal: none stated OT Goal Formulation: With patient Time For Goal Achievement: 06/29/23 Potential to Achieve Goals: Good ADL Goals Pt Will Perform Grooming: with modified independence;standing Pt Will Perform Upper Body Bathing: with modified independence;sitting Pt Will Perform Lower Body Bathing: with supervision;sit to/from stand Pt Will Perform Upper Body Dressing: with modified independence;sitting Pt Will Perform Lower Body Dressing: with supervision;sit to/from stand Pt Will Transfer to Toilet: with modified independence;ambulating;regular height toilet Pt/caregiver will Perform Home Exercise Program: Increased strength;Both right and left upper extremity;With theraband;With Supervision  Plan      Co-evaluation                 AM-PAC OT "6 Clicks" Daily Activity     Outcome Measure   Help from another person eating meals?: None Help from another person taking care of personal grooming?: A Little Help from another person toileting, which includes using toliet, bedpan, or urinal?: A Lot Help from another person bathing (including washing, rinsing, drying)?: A Lot Help from another person to put on and taking off regular upper body clothing?: A Little Help from another person to put on and taking off regular lower body clothing?: A Lot 6 Click Score: 16    End of Session Equipment Utilized During Treatment: Rolling walker (2 wheels);Oxygen  OT Visit Diagnosis:  Unsteadiness on feet (R26.81);Muscle weakness (generalized) (M62.81);History of falling (Z91.81);Pain Pain - Right/Left: Right Pain - part of body: Knee   Activity Tolerance Patient tolerated treatment well   Patient Left in chair;with call bell/phone within reach;with chair alarm set;with family/visitor present   Nurse Communication Mobility status        Time: 3086-5784 OT Time Calculation (min): 10 min  Charges: OT General Charges $OT Visit: 1 Visit OT Treatments $Self Care/Home Management : 8-22 mins  Danielle [name], OTD, OTR/L SecureChat Preferred Acute Rehab [phone]   Danielle [name] 06/15/2023, 9:18 AM

## 2023-06-15 NOTE — Progress Notes (Signed)
 Subjective: Patient was sleeping on rounds.  I did not attempt to wake her.  No acute events overnight.  Objective: Vital signs in last 24 hours: Temp:  [97.9 F (36.6 C)-98.2 F (36.8 C)] 97.9 F (36.6 C) (03/17 0432) Pulse Rate:  [62-68] 62 (03/17 0432) Resp:  [17-20] 20 (03/17 0432) BP: (110-124)/(50-64) 124/64 (03/17 0432) SpO2:  [94 %-100 %] 100 % (03/17 0432)  Assessment/Plan: #Hematuria Clear urine tinged with old blood.  No evidence of active bleeding as of this morning. Still receiving twice daily Eliquis.  Recommend holding if medically reasonable No bacteria, nitrites, or significant volume of WBCs noted on urinalysis.  Hematuria unlikely to be 2/2 hemorrhagic cystitis. Recent cystoscopy in the last few months was unremarkable. Presumed to be related to her fall and should be self-limiting.  Hgb has remained stable since her arrival.  Continue to follow labs.  Intake/Output from previous day: 03/16 0701 - 03/17 0700 In: 587 [P.O.:587] Out: 900 [Urine:900]  Intake/Output this shift: No intake/output data recorded.  Physical Exam:  General: Alert and oriented CV: No cyanosis Lungs: equal chest rise Gu: Foley catheter in place with clear urine tinged with old blood.  No clot material noted  Lab Results: Recent Labs    06/14/23 1024 06/14/23 1638 06/15/23 0545  HGB 10.3* 10.5* 10.2*  HCT 32.1* 32.9* 32.3*   BMET Recent Labs    06/14/23 0724 06/15/23 0545  NA 141 137  K 4.1 4.3  CL 100 102  CO2 27 26  GLUCOSE 171* 187*  BUN 35* 33*  CREATININE 1.21* 1.02*  CALCIUM 8.7* 8.4*     Studies/Results: DG CHEST PORT 1 VIEW Result Date: 06/13/2023 CLINICAL DATA:  Shortness of breath.  Fall. EXAM: PORTABLE CHEST 1 VIEW COMPARISON:  06/13/2023 FINDINGS: Heart and mediastinal contours are stable. Aortic atherosclerosis. Linear left basilar opacities compatible scarring. No acute confluent opacities, effusions or pneumothorax. No acute bony abnormality.  IMPRESSION: No active disease. Electronically Signed   By: Charlett Nose M.D.   On: 06/13/2023 17:54   DG Knee 1-2 Views Right Result Date: 06/13/2023 CLINICAL DATA:  Right knee pain. EXAM: RIGHT KNEE - 1-2 VIEW COMPARISON:  None Available. FINDINGS: No acute fracture or dislocation. No aggressive osseous lesion. Patient is status post right total knee arthroplasty with patellar resurfacing. The hardware is intact. No periprosthetic fracture or lucency. There is near anatomic alignment. No knee effusion or focal soft tissue swelling. No radiopaque foreign bodies. IMPRESSION: *No acute osseous abnormality of the right knee. Electronically Signed   By: Jules Schick M.D.   On: 06/13/2023 12:16   CT Head Wo Contrast Result Date: 06/13/2023 CLINICAL DATA:  Neck trauma, fall on blood thinners EXAM: CT HEAD WITHOUT CONTRAST CT CERVICAL SPINE WITHOUT CONTRAST TECHNIQUE: Multidetector CT imaging of the head and cervical spine was performed following the standard protocol without intravenous contrast. Multiplanar CT image reconstructions of the cervical spine were also generated. RADIATION DOSE REDUCTION: This exam was performed according to the departmental dose-optimization program which includes automated exposure control, adjustment of the mA and/or kV according to patient size and/or use of iterative reconstruction technique. COMPARISON:  None Available. FINDINGS: CT HEAD FINDINGS Brain: No evidence of acute infarction, hemorrhage, hydrocephalus, extra-axial collection or mass lesion/mass effect. Mild for age cerebral volume loss. Vascular: No hyperdense vessel or unexpected calcification. Skull: Normal. Negative for fracture or focal lesion. Sinuses/Orbits: No acute finding. CT CERVICAL SPINE FINDINGS Alignment: No traumatic malalignment. Degenerative anterolisthesis at C3-4 and C4-5.  Skull base and vertebrae: No acute fracture. No primary bone lesion or focal pathologic process. Soft tissues and spinal canal: No  prevertebral fluid or swelling. No visible canal hematoma. 17 mm left parotid mass. Disc levels:  Ordinary degenerative spurring in the cervical spine. Upper chest: No evidence of injury IMPRESSION: No evidence of acute intracranial or cervical spine injury. Electronically Signed   By: Tiburcio Pea M.D.   On: 06/13/2023 09:10   CT Cervical Spine Wo Contrast Result Date: 06/13/2023 CLINICAL DATA:  Neck trauma, fall on blood thinners EXAM: CT HEAD WITHOUT CONTRAST CT CERVICAL SPINE WITHOUT CONTRAST TECHNIQUE: Multidetector CT imaging of the head and cervical spine was performed following the standard protocol without intravenous contrast. Multiplanar CT image reconstructions of the cervical spine were also generated. RADIATION DOSE REDUCTION: This exam was performed according to the departmental dose-optimization program which includes automated exposure control, adjustment of the mA and/or kV according to patient size and/or use of iterative reconstruction technique. COMPARISON:  None Available. FINDINGS: CT HEAD FINDINGS Brain: No evidence of acute infarction, hemorrhage, hydrocephalus, extra-axial collection or mass lesion/mass effect. Mild for age cerebral volume loss. Vascular: No hyperdense vessel or unexpected calcification. Skull: Normal. Negative for fracture or focal lesion. Sinuses/Orbits: No acute finding. CT CERVICAL SPINE FINDINGS Alignment: No traumatic malalignment. Degenerative anterolisthesis at C3-4 and C4-5. Skull base and vertebrae: No acute fracture. No primary bone lesion or focal pathologic process. Soft tissues and spinal canal: No prevertebral fluid or swelling. No visible canal hematoma. 17 mm left parotid mass. Disc levels:  Ordinary degenerative spurring in the cervical spine. Upper chest: No evidence of injury IMPRESSION: No evidence of acute intracranial or cervical spine injury. Electronically Signed   By: Tiburcio Pea M.D.   On: 06/13/2023 09:10      LOS: 2 days    Elmon Kirschner, NP [facility] Pager: [phone]  06/15/2023, 8:34 AM

## 2023-06-15 NOTE — Progress Notes (Signed)
 RT at bedside X2 for nebulizer tx. PT and MD at bedside. Will attempt at a later time.

## 2023-06-15 NOTE — Assessment & Plan Note (Deleted)
 Patient more awake and alert this morning on reported prior encounters.  Daughter does report at bedside today that patient frequently gets confused and sometimes tries to get up and move around without asking for help. - Continue to monitor mentation - Caution with sedating meds - Continue holding home Klonopin for now

## 2023-06-15 NOTE — TOC Initial Note (Signed)
 Transition of Care [facility]) - Initial/Assessment Note    Patient Details  Name: Danielle [name] MRN: [id number] Date of Birth: 1933/07/14  Transition of [facility]) CM/SW Contact:    Sarin Comunale A [place], LCSW Phone Number: 06/15/2023, 10:37 AM  Clinical Narrative:                  CSW met with pt and pt's daughter [name] at bedside, pt stated that she was agreeable to SNF at discharge. States that she has used [facility] for PT/OT therapy currently at home. SNF workup completed. Bed offers pending. Medicare ratings with bed offers to be provided once available. CSW to fax out bed offers in [place], [place] area.   TOC will continue to follow.    Expected Discharge Plan: Skilled Nursing Facility Barriers to Discharge: Continued Medical Work up, SNF Pending bed offer, Insurance Authorization   Patient Goals and CMS Choice            Expected Discharge Plan and Services       Living arrangements for the past 2 months: Single Family Home                                      Prior Living Arrangements/Services Living arrangements for the past 2 months: Single Family Home Lives with:: Self          Need for Family Participation in Patient Care: Yes (Comment) Care giver support system in place?: Yes (comment) (pt's daughter, Karoline Caldwell) Current home services: Home PT, Home OT [phone])    Activities of Daily Living   ADL Screening (condition at time of admission) Independently performs ADLs?: Yes (appropriate for developmental age) Is the patient deaf or have difficulty hearing?: Yes Does the patient have difficulty seeing, even when wearing glasses/contacts?: No Does the patient have difficulty concentrating, remembering, or making decisions?: Yes  Permission Sought/Granted                  Emotional Assessment Appearance:: Appears stated age Attitude/Demeanor/Rapport: Complaining Affect (typically observed): Appropriate Orientation: : Oriented to Self,  Oriented to Place, Oriented to Situation Alcohol / Substance Use: Not Applicable Psych Involvement: No (comment)  Admission diagnosis:  Influenza A [J10.1] Influenza [J11.1] Fall, initial encounter [W19.XXXA] Dyspnea, unspecified type [R06.00] Patient Active Problem List   Diagnosis Date Noted   Urinary retention 06/15/2023   Chronic health problem 06/14/2023   Hematuria, gross 06/14/2023   Influenza 06/13/2023   Fall  R breast pain  R knee pain 06/13/2023   Influenza A 06/13/2023   Acute on chronic respiratory failure with hypoxemia (HCC)  Influenza A 06/13/2023   Somnolence 06/13/2023   Type 2 diabetes mellitus (HCC) 06/13/2023   Elevated transaminase level 06/13/2023   Breast pain, right 06/13/2023   Acute deep vein thrombosis (DVT) of right lower extremity (HCC) 02/19/2023   Nephrolithiasis 02/14/2023   Peripheral polyneuropathy 09/10/2022   Claudication (HCC) 09/10/2022   Subclinical hypothyroidism 03/11/2022   Atrial fibrillation (HCC) 02/28/2022   Paroxysmal atrial fibrillation (HCC) 02/27/2022   Acute on chronic diastolic CHF (congestive heart failure) (HCC) 02/27/2022   Multifocal atrial tachycardia (HCC) 02/26/2022   Coronary artery disease    PONV (postoperative nausea and vomiting)    Osteoporosis    Insomnia    Hypertension    Hyperlipidemia    History of hiatal hernia    Headache    GERD (gastroesophageal reflux disease)  Emphysema lung (HCC)    COPD (chronic obstructive pulmonary disease) (HCC)    CHF (congestive heart failure) (HCC)    Asthma    Arthritis    Anginal pain (HCC)    Callus 05/30/2020   Flat feet, bilateral 05/30/2020   Hammer toe of left foot 05/30/2020   OSA (obstructive sleep apnea) 12/16/2019   Internal hemorrhoids 11/26/2018   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/26/2018   Unstable angina (HCC) 10/30/2018   Positive D-dimer 09/08/2018   DOE (dyspnea on exertion) 09/07/2018   Class 3 obesity 09/07/2018   Edema 08/12/2018   Iron deficiency  anemia 08/12/2018   Senile purpura (HCC) 08/12/2018   Strain of right shoulder 08/12/2018   Dysphagia 08/12/2018   Chronic pain of both knees 05/14/2018   Stasis edema of left lower extremity 04/13/2018   Vitamin B12 deficiency 04/13/2018   Restless legs syndrome 03/04/2018   At high risk for falls 11/11/2017   Gastroesophageal reflux disease without esophagitis 11/11/2017   Stage 3a chronic kidney disease (HCC) 05/07/2017   Chronic respiratory failure with hypoxia (HCC) 05/05/2017   Vitamin D deficiency 02/27/2016   Nocturnal hypoxemia due to emphysema (HCC) 12/04/2015   Midsternal chest pain 11/11/2015   Chronic diastolic congestive heart failure (HCC) 11/11/2015   Chest pain 11/09/2015   Depression 11/09/2015   Panlobular emphysema (HCC) 11/09/2015   Elevated blood sugar 11/09/2015   Chronic insomnia 10/24/2015   Type 2 diabetes mellitus with hyperlipidemia (HCC) 08/14/2015   Primary osteoarthritis involving multiple joints 08/14/2015   Malaise and fatigue 08/14/2015   Degenerative lumbar disc 08/14/2015   Abnormal gait 08/14/2015   Mesenteric mass 02/19/2015   Coronary artery disease involving native coronary artery of native heart without angina pectoris 11/14/2014   Mixed hyperlipidemia 11/14/2014   Current mild episode of major depressive disorder (HCC) 11/14/2014   Pneumonia 2013   PCP:  Gordan Payment., MD Pharmacy:   [facility] [address] [place], [place] - [address] [place] [address] Phone: [phone] Fax: [phone]  [facility] [address] [place] [address] Phone: [phone] Fax: [phone]     Social Drivers of Health (SDOH) Social History: SDOH Screenings   Food Insecurity: No Food Insecurity (06/13/2023)  Housing: Low Risk  (06/13/2023)  Transportation Needs: No Transportation Needs (06/13/2023)  Utilities: Not At Risk (06/13/2023)  Social Connections: Moderately  Isolated (06/13/2023)  Tobacco Use: Medium Risk (06/13/2023)   SDOH Interventions:     Readmission Risk Interventions    06/15/2023   10:32 AM  Readmission Risk Prevention Plan  Transportation Screening Complete  PCP or Specialist Appt within 3-5 Days Complete  HRI or Home Care Consult Complete  Social Work Consult for Recovery Care Planning/Counseling Complete  Palliative Care Screening Not Applicable  Medication Review Oceanographer) Complete

## 2023-06-15 NOTE — Plan of Care (Signed)

## 2023-06-15 NOTE — Assessment & Plan Note (Addendum)
 Continues to have hematuria.  Hgb stable.  Weighing risks and benefits of anticoagulation with family. -Renal US pending to evaluate for renal contusion -AM CBC -Urology consulted, appreciate recommendations: -Discussed anticoagulation; can trial heparin and then transition to Eliquis if no active bleed -No Foley removal today

## 2023-06-15 NOTE — Progress Notes (Signed)
 MEWS Progress Note  Patient Details Name: Danielle [name] MRN: [id number] DOB: [birth year] Today's Date: 06/15/2023   MEWS Flowsheet Documentation:  Assess: MEWS Score Temp: 98 F (36.7 C) BP: (!) 135/108 MAP (mmHg): 115 Pulse Rate: (!) 139 ECG Heart Rate: 60 Resp: (!) 24 Level of Consciousness: Alert SpO2: 99 % O2 Device: Nasal Cannula O2 Flow Rate (L/min): 2 L/min Assess: MEWS Score MEWS Temp: 0 MEWS Systolic: 0 MEWS Pulse: 3 MEWS RR: 1 MEWS LOC: 0 MEWS Score: 4 MEWS Score Color: Red Assess: SIRS CRITERIA SIRS Temperature : 0 SIRS Respirations : 1 SIRS Pulse: 1 SIRS WBC: 0 SIRS Score Sum : 2 SIRS Temperature : 0 SIRS Pulse: 1 SIRS Respirations : 1 SIRS WBC: 0 SIRS Score Sum : 2 Assess: if the MEWS score is Yellow or Red Were vital signs accurate and taken at a resting state?: Yes Does the patient meet 2 or more of the SIRS criteria?: Yes Does the patient have a confirmed or suspected source of infection?: Yes MEWS guidelines implemented : Yes, red Treat MEWS Interventions: Considered administering scheduled or prn medications/treatments as ordered Take Vital Signs Increase Vital Sign Frequency : Red: Q1hr x2, continue Q4hrs until patient remains green for 12hrs Escalate MEWS: Escalate: Red: Discuss with charge nurse and notify provider. Consider notifying RRT. If remains red for 2 hours consider need for higher level of care Notify: Charge Nurse/RN Name of Charge Nurse/RN Notified: Argentina Ponder, RN Provider Notification Provider Name/Title: Rhys Martini, DO; Daphine Deutscher, MD Date Provider Notified: 06/15/23 Time Provider Notified: [id number] Method of Notification:  (secure chat) Notification Reason: Other (Comment) (to update vital signs, HR 139 and vitals flagged as RED MEWS) Provider response: At bedside Date of Provider Response: 06/15/23 Time of Provider Response: [id number]      Shenee Wignall 06/15/2023, 9:26 PM

## 2023-06-15 NOTE — Assessment & Plan Note (Addendum)
 AST, ALT still mildly elevated but stable.  Continue to suspect this is related to influenza infection. -Continue to monitor -Hepatitis panel -AM CMP

## 2023-06-15 NOTE — [place] FL2 (Signed)
 [place] MEDICAID FL2 LEVEL OF CARE FORM     IDENTIFICATION  Patient Name: Danielle [name] Birthdate: Mar 14, 1934 Sex: female Admission Date (Current Location): 06/13/2023  [name] and [place] Number:  [employer] and Address:  The [place], [address], [place], [place] [address]      Provider Number: [id number]  Attending Physician Name and Address:  Westley Chandler, MD  Relative Name and Phone Number:  Particia [name] (Daughter)  [phone]    Current Level of Care: Hospital Recommended Level of Care: Skilled Nursing Facility Prior Approval Number:    Date Approved/Denied:   PASRR Number: [id number]  Discharge Plan: SNF    Current Diagnoses: Patient Active Problem List   Diagnosis Date Noted   Urinary retention 06/15/2023   Chronic health problem 06/14/2023   Hematuria, gross 06/14/2023   Influenza 06/13/2023   Fall  R breast pain  R knee pain 06/13/2023   Influenza A 06/13/2023   Acute on chronic respiratory failure with hypoxemia (HCC)  Influenza A 06/13/2023   Somnolence 06/13/2023   Type 2 diabetes mellitus (HCC) 06/13/2023   Elevated transaminase level 06/13/2023   Breast pain, right 06/13/2023   Acute deep vein thrombosis (DVT) of right lower extremity (HCC) 02/19/2023   Nephrolithiasis 02/14/2023   Peripheral polyneuropathy 09/10/2022   Claudication (HCC) 09/10/2022   Subclinical hypothyroidism 03/11/2022   Atrial fibrillation (HCC) 02/28/2022   Paroxysmal atrial fibrillation (HCC) 02/27/2022   Acute on chronic diastolic CHF (congestive heart failure) (HCC) 02/27/2022   Multifocal atrial tachycardia (HCC) 02/26/2022   Coronary artery disease    PONV (postoperative nausea and vomiting)    Osteoporosis    Insomnia    Hypertension    Hyperlipidemia    History of hiatal hernia    Headache    GERD (gastroesophageal reflux disease)    Emphysema lung (HCC)    COPD (chronic obstructive pulmonary disease) (HCC)     CHF (congestive heart failure) (HCC)    Asthma    Arthritis    Anginal pain (HCC)    Callus 05/30/2020   Flat feet, bilateral 05/30/2020   Hammer toe of left foot 05/30/2020   OSA (obstructive sleep apnea) 12/16/2019   Internal hemorrhoids 11/26/2018   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/26/2018   Unstable angina (HCC) 10/30/2018   Positive D-dimer 09/08/2018   DOE (dyspnea on exertion) 09/07/2018   Class 3 obesity 09/07/2018   Edema 08/12/2018   Iron deficiency anemia 08/12/2018   Senile purpura (HCC) 08/12/2018   Strain of right shoulder 08/12/2018   Dysphagia 08/12/2018   Chronic pain of both knees 05/14/2018   Stasis edema of left lower extremity 04/13/2018   Vitamin B12 deficiency 04/13/2018   Restless legs syndrome 03/04/2018   At high risk for falls 11/11/2017   Gastroesophageal reflux disease without esophagitis 11/11/2017   Stage 3a chronic kidney disease (HCC) 05/07/2017   Chronic respiratory failure with hypoxia (HCC) 05/05/2017   Vitamin D deficiency 02/27/2016   Nocturnal hypoxemia due to emphysema (HCC) 12/04/2015   Midsternal chest pain 11/11/2015   Chronic diastolic congestive heart failure (HCC) 11/11/2015   Chest pain 11/09/2015   Depression 11/09/2015   Panlobular emphysema (HCC) 11/09/2015   Elevated blood sugar 11/09/2015   Chronic insomnia 10/24/2015   Type 2 diabetes mellitus with hyperlipidemia (HCC) 08/14/2015   Primary osteoarthritis involving multiple joints 08/14/2015   Malaise and fatigue 08/14/2015   Degenerative lumbar disc 08/14/2015   Abnormal gait 08/14/2015   Mesenteric mass 02/19/2015  Coronary artery disease involving native coronary artery of native heart without angina pectoris 11/14/2014   Mixed hyperlipidemia 11/14/2014   Current mild episode of major depressive disorder (HCC) 11/14/2014   Pneumonia 2013    Orientation RESPIRATION BLADDER Height & Weight     Self, Place, Situation, Time  O2 (2L) Continent Weight: 238 lb (108 kg) Height:   5\' 3"  (160 cm)  BEHAVIORAL SYMPTOMS/MOOD NEUROLOGICAL BOWEL NUTRITION STATUS      Continent Diet (see DC summary)  AMBULATORY STATUS COMMUNICATION OF NEEDS Skin   Extensive Assist Verbally Other (Comment) (Wound / Incision (Open or Dehisced) 06/13/23 Skin tear Knee Anterior;Right. Wound / Incision (Open or Dehisced) 06/13/23 Skin tear Thigh Distal;Left;Posterior)                       Personal Care Assistance Level of Assistance  Bathing, Feeding, Dressing Bathing Assistance: Maximum assistance Feeding assistance: Limited assistance Dressing Assistance: Maximum assistance     Functional Limitations Info  Sight, Hearing, Speech Sight Info: Adequate Hearing Info: Adequate Speech Info: Adequate    SPECIAL CARE FACTORS FREQUENCY  PT (By licensed PT), OT (By licensed OT)     PT Frequency: 5x/week OT Frequency: 5x/week            Contractures Contractures Info: Not present    Additional Factors Info  Code Status, Allergies Code Status Info: DNR Allergies Info: Codeine  Levofloxacin  Penicillins  Xarelto (Rivaroxaban)  Tape           Current Medications (06/15/2023):  This is the current hospital active medication list Current Facility-Administered Medications  Medication Dose Route Frequency Provider Last Rate Last Admin   acetaminophen (TYLENOL) tablet 650 mg  650 mg Oral Q6H Westley Chandler, MD   650 mg at 06/15/23 1037   Or   acetaminophen (TYLENOL) suppository 650 mg  650 mg Rectal Q6H Westley Chandler, MD       albuterol (PROVENTIL) (2.5 MG/3ML) 0.083% nebulizer solution 2.5 mg  2.5 mg Nebulization Q4H PRN Westley Chandler, MD       amiodarone (PACERONE) tablet 200 mg  200 mg Oral Daily Tiffany Kocher, DO   200 mg at 06/15/23 1036   apixaban (ELIQUIS) tablet 5 mg  5 mg Oral BID Tiffany Kocher, DO   5 mg at 06/14/23 0931   gabapentin (NEURONTIN) capsule 100 mg  100 mg Oral QHS Tiffany Kocher, DO   100 mg at 06/14/23 2120   insulin aspart (novoLOG) injection 0-15  Units  0-15 Units Subcutaneous TID WC Cyndia Skeeters, DO   2 Units at 06/15/23 0855   ipratropium-albuterol (DUONEB) 0.5-2.5 (3) MG/3ML nebulizer solution 3 mL  3 mL Nebulization BID Westley Chandler, MD   3 mL at 06/15/23 1020   lidocaine (LIDODERM) 5 % 1 patch  1 patch Transdermal Q24H Tiffany Kocher, DO   1 patch at 06/13/23 1800   loratadine (CLARITIN) tablet 10 mg  10 mg Oral Daily Tiffany Kocher, DO   10 mg at 06/15/23 1036   mometasone-formoterol (DULERA) 200-5 MCG/ACT inhaler 2 puff  2 puff Inhalation BID Tiffany Kocher, DO   2 puff at 06/15/23 1024   oseltamivir (TAMIFLU) capsule 30 mg  30 mg Oral BID Cyndia Skeeters, DO   30 mg at 06/15/23 1036   oxyCODONE (Oxy IR/ROXICODONE) immediate release tablet 2.5 mg  2.5 mg Oral Q8H PRN Westley Chandler, MD   2.5 mg at 06/15/23 1037   pantoprazole (PROTONIX) EC tablet  40 mg  40 mg Oral BID Tiffany Kocher, DO   40 mg at 06/15/23 1037   polyethylene glycol (MIRALAX / GLYCOLAX) packet 17 g  17 g Oral Daily Westley Chandler, MD   17 g at 06/14/23 1121   rOPINIRole (REQUIP) tablet 0.25 mg  0.25 mg Oral QHS Tiffany Kocher, DO   0.25 mg at 06/14/23 2120   rosuvastatin (CRESTOR) tablet 40 mg  40 mg Oral Daily Tiffany Kocher, DO   40 mg at 06/15/23 1036   senna (SENOKOT) tablet 17.2 mg  2 tablet Oral QHS Tiffany Kocher, DO   17.2 mg at 06/14/23 2119   tamsulosin (FLOMAX) capsule 0.4 mg  0.4 mg Oral QPC supper Westley Chandler, MD   0.4 mg at 06/14/23 1711   Facility-Administered Medications Ordered in Other Encounters  Medication Dose Route Frequency Provider Last Rate Last Admin   clindamycin (CLEOCIN) 900 mg in dextrose 5 % 50 mL IVPB  900 mg Intravenous 60 min Pre-Op Almond Lint, MD       And   gentamicin (GARAMYCIN) 5 mg/kg in dextrose 5 % 50 mL IVPB  5 mg/kg Intravenous 60 min Pre-Op Almond Lint, MD         Discharge Medications: Please see discharge summary for a list of discharge medications.  Relevant Imaging Results:  Relevant Lab  Results:   Additional Information [id number]  Maleeha Halls A [place], LCSW

## 2023-06-15 NOTE — Progress Notes (Signed)
 Mobility Specialist: Progress Note   06/15/23 1035  Mobility  Activity Transferred to/from [facility]  Level of Assistance Contact guard assist, steadying assist  Assistive Device Front wheel walker  Distance Ambulated (ft) 3 ft  Activity Response Tolerated well  Mobility Referral Yes  Mobility visit 1 Mobility  Mobility Specialist Start Time (ACUTE ONLY) 0955  Mobility Specialist Stop Time (ACUTE ONLY) 1001  Mobility Specialist Time Calculation (min) (ACUTE ONLY) 6 min    Responded to pt call bell requesting to use BR. Received in chair. Declined ambulation to BR, requesting to use BSC instead. MinA for STS, minG for short bout of ambulation to [facility]. Requested to sit for awhile. Left on [facility] with all needs met, call bell in reach. Family in room.  MS returned ot assist pt back to chair. Did not attempt further ambulation d/t pt being sore and uncomfortable from sitting on BSC. MinA to stand while MS assisted with pericare. MinG for ambulation back to chair. Left in chair with all needs met, call bell in reach. Chair alarm on. RT in room.   Maurene Capes Mobility Specialist Please contact via SecureChat or Rehab office at [phone]

## 2023-06-16 DIAGNOSIS — R339 Retention of urine, unspecified: Secondary | ICD-10-CM | POA: Diagnosis not present

## 2023-06-16 DIAGNOSIS — J9621 Acute and chronic respiratory failure with hypoxia: Secondary | ICD-10-CM | POA: Diagnosis not present

## 2023-06-16 DIAGNOSIS — W19XXXA Unspecified fall, initial encounter: Secondary | ICD-10-CM | POA: Diagnosis not present

## 2023-06-16 DIAGNOSIS — J101 Influenza due to other identified influenza virus with other respiratory manifestations: Secondary | ICD-10-CM | POA: Diagnosis not present

## 2023-06-16 DIAGNOSIS — I5030 Unspecified diastolic (congestive) heart failure: Secondary | ICD-10-CM | POA: Insufficient documentation

## 2023-06-16 LAB — CBC
HCT: 30.7 % — ABNORMAL LOW (ref 36.0–46.0)
Hemoglobin: 9.7 g/dL — ABNORMAL LOW (ref 12.0–15.0)
MCH: 29.6 pg (ref 26.0–34.0)
MCHC: 31.6 g/dL (ref 30.0–36.0)
MCV: 93.6 fL (ref 80.0–100.0)
Platelets: 163 10*3/uL (ref 150–400)
RBC: 3.28 MIL/uL — ABNORMAL LOW (ref 3.87–5.11)
RDW: 15.4 % (ref 11.5–15.5)
WBC: 5.2 10*3/uL (ref 4.0–10.5)
nRBC: 0 % (ref 0.0–0.2)

## 2023-06-16 LAB — COMPREHENSIVE METABOLIC PANEL
ALT: 50 U/L — ABNORMAL HIGH (ref 0–44)
AST: 40 U/L (ref 15–41)
Albumin: 3.1 g/dL — ABNORMAL LOW (ref 3.5–5.0)
Alkaline Phosphatase: 46 U/L (ref 38–126)
Anion gap: 9 (ref 5–15)
BUN: 26 mg/dL — ABNORMAL HIGH (ref 8–23)
CO2: 27 mmol/L (ref 22–32)
Calcium: 8.6 mg/dL — ABNORMAL LOW (ref 8.9–10.3)
Chloride: 99 mmol/L (ref 98–111)
Creatinine, Ser: 0.93 mg/dL (ref 0.44–1.00)
GFR, Estimated: 59 mL/min — ABNORMAL LOW (ref >60)
Glucose, Bld: 164 mg/dL — ABNORMAL HIGH (ref 70–99)
Potassium: 4 mmol/L (ref 3.5–5.1)
Sodium: 135 mmol/L (ref 135–145)
Total Bilirubin: 0.5 mg/dL (ref 0.0–1.2)
Total Protein: 6 g/dL — ABNORMAL LOW (ref 6.5–8.1)

## 2023-06-16 LAB — GLUCOSE, CAPILLARY
Glucose-Capillary: 210 mg/dL — ABNORMAL HIGH (ref 70–99)
Glucose-Capillary: 229 mg/dL — ABNORMAL HIGH (ref 70–99)
Glucose-Capillary: 184 mg/dL — ABNORMAL HIGH (ref 70–99)
Glucose-Capillary: 243 mg/dL — ABNORMAL HIGH (ref 70–99)

## 2023-06-16 LAB — HEMOGLOBIN AND HEMATOCRIT, BLOOD
HCT: 29.5 % — ABNORMAL LOW (ref 36.0–46.0)
Hemoglobin: 9.3 g/dL — ABNORMAL LOW (ref 12.0–15.0)

## 2023-06-16 MED ORDER — FUROSEMIDE 40 MG PO TABS
40.0000 mg | ORAL_TABLET | Freq: Every day | ORAL | Status: DC
  Administered 2023-06-16: 40 mg via ORAL
  Filled 2023-06-16: qty 1

## 2023-06-16 MED ORDER — MENTHOL 3 MG MT LOZG
1.0000 | LOZENGE | OROMUCOSAL | Status: DC | PRN
  Administered 2023-06-16: 3 mg via ORAL
  Filled 2023-06-16: qty 9

## 2023-06-16 MED ORDER — OXYCODONE HCL 5 MG PO TABS
2.5000 mg | ORAL_TABLET | Freq: Once | ORAL | Status: AC
  Administered 2023-06-16: 2.5 mg via ORAL
  Filled 2023-06-16: qty 1

## 2023-06-16 MED ORDER — APIXABAN 2.5 MG PO TABS
2.5000 mg | ORAL_TABLET | Freq: Two times a day (BID) | ORAL | Status: DC
  Administered 2023-06-16 – 2023-06-20 (×9): 2.5 mg via ORAL
  Filled 2023-06-16 (×9): qty 1

## 2023-06-16 MED ORDER — OXYCODONE HCL 5 MG PO TABS
2.5000 mg | ORAL_TABLET | Freq: Once | ORAL | Status: AC
  Administered 2023-06-17: 2.5 mg via ORAL
  Filled 2023-06-16: qty 1

## 2023-06-16 MED ORDER — INSULIN GLARGINE 100 UNIT/ML ~~LOC~~ SOLN
5.0000 [IU] | Freq: Every day | SUBCUTANEOUS | Status: DC
  Administered 2023-06-16 – 2023-06-17 (×2): 5 [IU] via SUBCUTANEOUS
  Filled 2023-06-16 (×2): qty 0.05

## 2023-06-16 NOTE — Progress Notes (Signed)
    Subjective: Patient was awake but nodding off after administration of pain medication. Continues complaining of right breast pain. Accompanied by daughter, with who I reviewed case and plan.   Objective: Vital signs in last 24 hours: Temp:  [98 F (36.7 C)-98.4 F (36.9 C)] 98 F (36.7 C) (03/18 0824) Pulse Rate:  [64-140] 67 (03/18 0824) Resp:  [16-24] 16 (03/18 0824) BP: (102-138)/(40-108) 138/53 (03/18 0824) SpO2:  [92 %-99 %] 99 % (03/18 0824)  Assessment/Plan: #Hematuria Clear urine tinged with old blood.  No evidence of active bleeding as of this morning. Still receiving twice daily Eliquis.  Recommend holding if medically reasonable No bacteria, nitrites, or significant volume of WBCs noted on urinalysis.  Hematuria unlikely to be 2/2 hemorrhagic cystitis. Recent cystoscopy in the last few months was unremarkable. Presumed to be related to her fall and should be self-limiting.  Hgb has remained stable since her arrival.  Continue to follow labs. Ok to remove foley if comfortable with pt ambulating while receiving narcotics. She declined the Purewick, but has no clinical reason for catheterization. Foley could be perpetuating minor bleeding.    Intake/Output from previous day: 03/17 0701 - 03/18 0700 In: -  Out: 400 [Urine:400]  Intake/Output this shift: No intake/output data recorded.  Physical Exam:  General: Alert and oriented CV: No cyanosis Lungs: equal chest rise Gu: Foley catheter in place with clear urine tinged with old blood.  No clot material noted  Lab Results: Recent Labs    06/14/23 1638 06/15/23 0545 06/16/23 0532  HGB 10.5* 10.2* 9.7*  HCT 32.9* 32.3* 30.7*   BMET Recent Labs    06/15/23 0545 06/16/23 0532  NA 137 135  K 4.3 4.0  CL 102 99  CO2 26 27  GLUCOSE 187* 164*  BUN 33* 26*  CREATININE 1.02* 0.93  CALCIUM 8.4* 8.6*     Studies/Results: US RENAL Result Date: 06/15/2023 CLINICAL DATA:  Hematuria EXAM: RENAL / URINARY  TRACT ULTRASOUND COMPLETE COMPARISON:  04/21/2023 FINDINGS: Right Kidney: Renal measurements: 11.1 x 5.3 x 5.9 cm = volume: 179 mL. Echogenicity within normal limits. 1.7 cm lower pole cyst. No solid mass, shadowing stone, or hydronephrosis visualized. Left Kidney: Renal measurements: 12.5 x 5.3 x 5.6 cm = volume: 196 mL. Echogenicity within normal limits. 1.5 cm shadowing stone in the midpole of the left kidney. No mass or hydronephrosis visualized. Bladder: Decompressed by Foley catheter. Other: None. IMPRESSION: Nonobstructing 1.5 cm left renal stone. No hydronephrosis. Electronically Signed   By: Duanne Guess D.O.   On: 06/15/2023 13:18      LOS: 3 days   Elmon Kirschner, NP [facility] Pager: [phone]  06/16/2023, 10:48 AM

## 2023-06-16 NOTE — Assessment & Plan Note (Addendum)
 EF 60-65% November 2023. -Strict I&Os, daily weights -Holding home Spironolactone 12.5 mg -Restart home Lasix 40 mg daily -Can add additional Lasix if receives further blood products

## 2023-06-16 NOTE — Assessment & Plan Note (Addendum)
 A1c 7.4.  CBGs ranging in 200s, 25 units SA past 24 hours. Home regimen: Glimepiride 4 mg daily, metformin 500 mg daily, and Januvia 100 mg daily. -Increased to mSSI overnight -Start 5 units LAI -Linagliptin 5 mg daily -CBGs ACHS

## 2023-06-16 NOTE — Assessment & Plan Note (Addendum)
 Right knee x-ray and CXR both without evidence of fracture.  Patient continues to endorse severe pain to right breast this AM. -PT/OT eval and treat -Tylenol 650 mg Q6h [facility] -Oxycodone 2.5 mg Q8 PRN for severe pain

## 2023-06-16 NOTE — Inpatient Diabetes Management (Signed)
 Inpatient Diabetes Program Recommendations  AACE/ADA: New Consensus Statement on Inpatient Glycemic Control (2015)  Target Ranges:  Prepandial:   less than 140 mg/dL      Peak postprandial:   less than 180 mg/dL (1-2 hours)      Critically ill patients:  140 - 180 mg/dL   Lab Results  Component Value Date   GLUCAP 210 (H) 06/16/2023   HGBA1C 7.4 (H) 06/14/2023    Latest Reference Range & Units 06/14/23 08:26 06/14/23 13:01 06/14/23 17:46 06/14/23 21:19 06/15/23 08:15 06/15/23 12:17 06/15/23 16:05 06/15/23 21:13 06/16/23 08:28  Glucose-Capillary 70 - 99 mg/dL 130 (H) 865 (H) 784 (H) 275 (H) 145 (H) Novolog 2 units 274 (H) Novolog 8 units 374 (H) Novolog 15 units 115 (H) 210 (H) Novolog 5 units  (H): Data is abnormally high  Diabetes history: DM2 Outpatient Diabetes medications: Amaryl 2 mg Daily, Metformin 500 mg Daily, Januvia 100 mg Daily  Current orders for Inpatient glycemic control: Tradjenta 5 mg Daily, Novolog 0-15 units tid   Inpatient Diabetes Program Recommendations:    Patient received Novolog total of 28 units over the past 24 hrs. Please consider: -Add Novolog 4 units tid meal coverage if eats @ least 50% -Decrease Novolog correction to 0-9 units tid  .Thank you, Danielle [name]. Azaylah Stailey, RN, MSN, CDCES  Diabetes Coordinator Inpatient Glycemic Control Team Team Pager [phone] (8am-5pm) 06/16/2023 9:54 AM

## 2023-06-16 NOTE — Assessment & Plan Note (Addendum)
 Continues to have hematuria.  Hgb stable.  Weighing risks and benefits of anticoagulation with family, agreed to Eliquis.  Renal US negative for contusion. -PM H&H 1600 -AM CBC -Urology consulted, appreciate recommendations: -Transition from heparin trial to Eliquis 2.5 mg BID today

## 2023-06-16 NOTE — Assessment & Plan Note (Addendum)
 Foley catheter in place, consulted Urology for removal plans: -Remove Foley 0700 tomorrow with serial string of bottles -Bladder scans Q6h afterwards x24 hours

## 2023-06-16 NOTE — Progress Notes (Signed)
 Mobility Specialist: Progress Note   06/16/23 1006  Mobility  Activity Transferred to/from [facility]  Level of Assistance Minimal assist, patient does 75% or more  Assistive Device Front wheel walker  Activity Response Tolerated fair  Mobility Referral Yes  Mobility visit 1 Mobility  Mobility Specialist Start Time (ACUTE ONLY) 0930  Mobility Specialist Stop Time (ACUTE ONLY) 0935  Mobility Specialist Time Calculation (min) (ACUTE ONLY) 5 min    Pt requesting to use BSC - received in chair. MinA +2 STS, minG for stand pivot to [facility]. Left on [facility] with all needs met, call bell in reach.   Maurene Capes Mobility Specialist Please contact via SecureChat or Rehab office at [phone]

## 2023-06-16 NOTE — TOC Progression Note (Addendum)
 Transition of Care [facility]) - Progression Note    Patient Details  Name: Danielle [name] MRN: [id number] Date of Birth: December 02, 1933  Transition [facility] Care [facility]) CM/SW Contact  Zerline Melchior A [place], LCSW Phone Number: 06/16/2023, 11:45 AM  Clinical Narrative:     CSW met with pt at bedside to provide bed offers with Medicare.gov ratings. Pt was sleeping soundly and could no be roused. CSW contacted pt's daughter Karoline Caldwell, presented bed offers, selected [facility] for placement. CSW to follow up with facility regarding bed availability and if they have a private room available. CSW contacted insurance authorization with [employer] and spoke with Tammy, started insurance authorization as pt is near medical stability. Status pending.   CSW spoke with Shantell at [employer], stated pt has 7 day quarantine, can DC on Friday the 21st if Berkley Harvey is approved and pt is stable.   TOC will continue to follow.     Expected Discharge Plan: Skilled Nursing Facility Barriers to Discharge: Continued Medical Work up, SNF Pending bed offer, Insurance Authorization  Expected Discharge Plan and Services       Living arrangements for the past 2 months: Single Family Home                                       Social Determinants of Health (SDOH) Interventions SDOH Screenings   Food Insecurity: No Food Insecurity (06/13/2023)  Housing: Low Risk  (06/13/2023)  Transportation Needs: No Transportation Needs (06/13/2023)  Utilities: Not At Risk (06/13/2023)  Social Connections: Moderately Isolated (06/13/2023)  Tobacco Use: Medium Risk (06/13/2023)    Readmission Risk Interventions    06/15/2023   10:32 AM  Readmission Risk Prevention Plan  Transportation Screening Complete  PCP or Specialist Appt within 3-5 Days Complete  HRI or Home Care Consult Complete  Social Work Consult for Recovery Care Planning/Counseling Complete  Palliative Care Screening Not Applicable  Medication Review [employer])  Complete

## 2023-06-16 NOTE — Progress Notes (Signed)
 2130: Dr. Rexene Alberts came at bedside to evaluate patient upon vitals flagged as RED MEWS, with HR in 130's. Pt is A&O x 4, denies chest pain, palpitations, but C/o severe pain to right breast. prn oxy po given as ordered. EKG done as ordered. Pt placed on telemetry monitor as ordered.   Pt continues on 2L O2 nasal cannula and pt refused to go to bed and pt would like to stay in the chair for the night.   Foley in place with red urine output noted.    Safety maintained. Call bell in reach. Will continue to monitor.

## 2023-06-16 NOTE — Plan of Care (Signed)

## 2023-06-16 NOTE — Progress Notes (Addendum)
 Daily Progress Note Intern Pager: [phone]  Patient name: Danielle [name] Medical record number: [id number] [name] of birth: 12-Oct-1933 Age: 89 y.o. Gender: female  Primary Care Provider: Gordan Payment., MD Consultants: None Code Status: DNR limited  Danielle [name]:  3/15 - Admitted, Foley placed, imaging negative for fracture 3/17 - Heparin restarted with Urology agreement  Assessment and Plan: Danielle [name] is a 89 y.o. female with a pertinent PMH of T2DM, HFpEF, OSA, CKD 3a, and Afib on Eliquis who presented with AHRF iso influenza A as well as fall at home.  Now nearly back to baseline respiratory status.  Will mobilize patient out of bed for meals and order incentive spirometry.  Pain from fall is still not adequately controlled, will continue to work on pain regimen, however must use caution in older adult with significant delirium risk factors.  Hgb 10.2>9.7 on heparin trial started yesterday, will transition to Eliquis today and recheck CBC PM. Will do void trial tomorrow, then stable for discharge. Assessment & Plan Acute on chronic respiratory failure with hypoxemia (HCC)  Influenza A Remains on baseline 2L [place], medically improving for discharge. -DuoNeb BID, albuterol neb Q4h PRN -Continue home Dulera 2 puffs BID -Tamiflu BID, renally dosed x5 days (3/15-3/19) -OOB to chair for meals -Follow-up blood cultures -AM CMP Hematuria, gross Continues to have hematuria.  Hgb stable.  Weighing risks and benefits of anticoagulation with family, agreed to Eliquis.  Renal US negative for contusion. -PM H&H 1600 -AM CBC -Urology consulted, appreciate recommendations: -Transition from heparin trial to Eliquis 2.5 mg BID today Urinary retention Foley catheter in place, consulted Urology for removal plans: -Remove Foley 0700 tomorrow with serial string of bottles -Bladder scans Q6h afterwards x24 hours Fall  R breast pain  R knee pain Right  knee x-ray and CXR both without evidence of fracture.  Patient continues to endorse severe pain to right breast this AM. -Danielle/OT eval and treat -Tylenol 650 mg Q6h [facility] -Oxycodone 2.5 mg Q8 PRN for severe pain (HFpEF) heart failure with preserved ejection fraction (HCC) EF 60-65% November 2023. -Strict I&Os, daily weights -Holding home Spironolactone 12.5 mg -Restart home Lasix 40 mg daily -Can add additional Lasix if receives further blood products Type 2 diabetes mellitus (HCC) A1c 7.4.  CBGs ranging in 200s, 25 units SA past 24 hours. Home regimen: Glimepiride 4 mg daily, metformin 500 mg daily, and Januvia 100 mg daily. -Increased to mSSI overnight -Start 5 units LAI -Linagliptin 5 mg daily -CBGs ACHS Elevated transaminase level Improved AST, ALT still mildly elevated but stable.  Continue to suspect this is related to influenza infection. -Continue to monitor -Hepatitis panel -AM CMP Chronic health problem Atrial fibrillation - Continue home amiodarone 200 mg, hold Eliquis 2.5 mg twice daily Neuropathy - Continue gabapentin 100 mg at bedtime Allergies - Continue Claritin 10 mg daily GERD - Continue pantoprazole 40 mg twice daily Restless leg - Continue ropinirole 0.25 mg at bedtime Hyperlipidemia - Continue rosuvastatin 40 mg daily Constipation - Continue senna 17.2 mg at bedtime, MiraLAX daily  FEN/GI: Carb modified diet PPx: Heparin trial > Eliquis 2.5 mg BID Dispo: SNF  pending placement . Barriers include pain control.  Subjective:  Overnight, patient was tachycardic to 140 BPM with largely unremarkable EKG (few PVCs, Afib).  HR improved on recheck, likely secondary to exertion getting to chair.  Also complained of right breast pain, which was treated with additional 2.5 mg oxycodone x1 and  redirection as well as delirium precautions.  Suspect sundowning element.  This morning, patient is continuing to complain of R breast pain frequently.  Otherwise, no  concerns.  Objective: Temp:  [98 F (36.7 C)-98.4 F (36.9 C)] 98 F (36.7 C) (03/18 0824) Pulse Rate:  [64-140] 67 (03/18 0824) Resp:  [16-24] 16 (03/18 0824) BP: (102-138)/(40-108) 138/53 (03/18 0824) SpO2:  [92 %-99 %] 99 % (03/18 0824)  Physical Exam: General: Elderly patient, chronically ill, resting in bed in moderate distress due to pain, alert and at baseline. Cardiovascular: Regular rate and rhythm. Normal S1/S2. No murmurs, rubs, or gallops appreciated. 2+ radial pulses. Pulmonary: Clear bilaterally to ascultation. No increased WOB, no accessory muscle usage on 3L [place]. No wheezes, crackles, or rhonchi. Abdominal: Normoactive bowel sounds, nondistended. No tenderness to deep or light palpation. No rebound or guarding. Skin: Scattered ecchymoses over fragile skin, in particular on R side.  R knee with small abrasion but no edema or hematoma. MSK: Significant tenderness to palpation of R upper chest and precordium. Extremities: 2+ peripheral piting edema bilaterally.  Capillary refill <2 seconds.  Laboratory: Most recent CBC Lab Results  Component Value [name]   WBC 5.2 06/16/2023   HGB 9.7 (L) 06/16/2023   HCT 30.7 (L) 06/16/2023   MCV 93.6 06/16/2023   PLT 163 06/16/2023   Most recent BMP    Latest Ref Rng & Units 06/16/2023    5:32 AM  BMP  Glucose 70 - 99 mg/dL 161   BUN 8 - 23 mg/dL 26   Creatinine 0.96 - 1.00 mg/dL 0.45   Sodium 409 - 811 mmol/L 135   Potassium 3.5 - 5.1 mmol/L 4.0   Chloride 98 - 111 mmol/L 99   CO2 22 - 32 mmol/L 27   Calcium 8.9 - 10.3 mg/dL 8.6     Other pertinent labs: -Hepatitis panel in process  New Imaging/Diagnostic Tests: -EKG overnight: Afib, few PVCs, no ischemic changes -Renal US: 1.5 cm nonobstructing L renal stone, no hydronephrosis, no contusion  Elleanor Guyett Sharion Dove, MD 06/16/2023, 8:34 AM  PGY-1, [facility] FPTS Intern pager: [phone], text pages welcome Secure chat group [facility]  Teaching Service

## 2023-06-16 NOTE — Progress Notes (Signed)
 Physical Therapy Treatment Patient Details Name: Danielle [name] MRN: [id number] DOB: 09-30-1933 Today's Date: 06/16/2023   History of Present Illness 89 year old female who presented 3/15 with R leg and breast pain s/p mechanical fall. Admitted with acute on chronic respiratory failure with hypoxemia and found to be influenza A+.  PMHx: COPD, chronic respiratory failure with hypoxia on home oxygen, coronary artery disease, morbid obesity, diabetes mellitus type 2, chronic kidney disease stage IIIa, hyperlipidemia.    PT Comments  Pt reports having a bad night. Session limited to transfers only due to pain and fatigue. Pt required min assist sit to stand, and CGA SPT with RW. VSS on 2L. Pt positioned in recliner with feet elevated.    If plan is discharge home, recommend the following: A little help with walking and/or transfers;A little help with bathing/dressing/bathroom;Assistance with cooking/housework;Direct supervision/assist for medications management;Direct supervision/assist for financial management;Assist for transportation;Help with stairs or ramp for entrance   Can travel by private vehicle     Yes  Equipment Recommendations  None recommended by PT    Recommendations for Other Services       Precautions / Restrictions Precautions Precautions: Fall;Other (comment) Recall of Precautions/Restrictions: Intact Precaution/Restrictions Comments: Watch O2 sats     Mobility  Bed Mobility                    Transfers Overall transfer level: Needs assistance Equipment used: Rolling walker (2 wheels) Transfers: Sit to/from Stand, Bed to chair/wheelchair/BSC Sit to Stand: Min assist   Step pivot transfers: Contact guard assist       General transfer comment: assist to power up, pivot steps BSC to recliner    Ambulation/Gait                   Stairs             Wheelchair Mobility     Tilt Bed    Modified Rankin (Stroke Patients Only)        Balance Overall balance assessment: Needs assistance Sitting-balance support: Feet supported, Bilateral upper extremity supported Sitting balance-Leahy Scale: Fair     Standing balance support: Reliant on assistive device for balance, During functional activity, Bilateral upper extremity supported Standing balance-Leahy Scale: Poor                              Communication Communication Communication: No apparent difficulties Factors Affecting Communication: Reduced clarity of speech  Cognition Arousal: Alert Behavior During Therapy: WFL for tasks assessed/performed   PT - Cognitive impairments: Safety/Judgement                         Following commands: Intact      Cueing Cueing Techniques: Verbal cues  Exercises      General Comments General comments (skin integrity, edema, etc.): VSS on 2L      Pertinent Vitals/Pain Pain Assessment Pain Assessment: 0-10 Pain Score: 9  Pain Location: R breast Pain Descriptors / Indicators: Discomfort, Sore, Grimacing, Guarding Pain Intervention(s): Monitored during session, Limited activity within patient's tolerance, Repositioned, Patient requesting pain meds-RN notified    Home Living                          Prior Function            PT Goals (current goals can now be found in the care  plan section) Acute Rehab PT Goals Patient Stated Goal: decrease pain Progress towards PT goals: Progressing toward goals    Frequency    Min 2X/week      PT Plan      Co-evaluation              AM-PAC PT "6 Clicks" Mobility   Outcome Measure  Help needed turning from your back to your side while in a flat bed without using bedrails?: A Lot Help needed moving from lying on your back to sitting on the side of a flat bed without using bedrails?: A Lot Help needed moving to and from a bed to a chair (including a wheelchair)?: A Little Help needed standing up from a chair using your  arms (e.g., wheelchair or bedside chair)?: A Little Help needed to walk in hospital room?: A Lot Help needed climbing 3-5 steps with a railing? : Total 6 Click Score: 13    End of Session Equipment Utilized During Treatment: Gait belt;Oxygen Activity Tolerance: Patient limited by fatigue;Patient limited by pain Patient left: in chair;with call bell/phone within reach Nurse Communication: Patient requests pain meds PT Visit Diagnosis: Unsteadiness on feet (R26.81);Other abnormalities of gait and mobility (R26.89);Muscle weakness (generalized) (M62.81);History of falling (Z91.81);Difficulty in walking, not elsewhere classified (R26.2);Pain Pain - Right/Left: Right     Time: 4098-1191 PT Time Calculation (min) (ACUTE ONLY): 12 min  Charges:    $Therapeutic Activity: 8-22 mins PT General Charges $$ ACUTE PT VISIT: 1 Visit                     Ferd Glassing., PT  Office # [phone]    Ilda Foil 06/16/2023, 9:46 AM

## 2023-06-16 NOTE — Assessment & Plan Note (Addendum)
 Remains on baseline 2L [place], medically improving for discharge. -DuoNeb BID, albuterol neb Q4h PRN -Continue home Dulera 2 puffs BID -Tamiflu BID, renally dosed x5 days (3/15-3/19) -OOB to chair for meals -Follow-up blood cultures -AM CMP

## 2023-06-16 NOTE — TOC Progression Note (Signed)
 Transition of Care [facility]) - Progression Note    Patient Details  Name: Danielle [name] MRN: [id number] Date of Birth: October 17, 1933  [facility]) CM/SW Contact  Breylan Lefevers A [place], LCSW Phone Number: 06/16/2023, 4:33 PM  Clinical Narrative:     Update 06/17/23 1148 Pt's ambulance transportation authorization for [facility] was approved. Auth ID: [id number].  Estimated DC date remains Friday 06/19/23. Authorization starts at date of admission. Pt has 5 days to admit within date of approval.    Insurance authorization approved for pt to [employer] ID: [id number]  Ambulance transportation request under med review.    TOC will continue to follow.   Expected Discharge Plan: Skilled Nursing Facility Barriers to Discharge: Continued Medical Work up, SNF Pending bed offer, Insurance Authorization  Expected Discharge Plan and Services       Living arrangements for the past 2 months: Single Family Home                                       Social Determinants of Health (SDOH) Interventions SDOH Screenings   Food Insecurity: No Food Insecurity (06/13/2023)  Housing: Low Risk  (06/13/2023)  Transportation Needs: No Transportation Needs (06/13/2023)  Utilities: Not At Risk (06/13/2023)  Social Connections: Moderately Isolated (06/13/2023)  Tobacco Use: Medium Risk (06/13/2023)    Readmission Risk Interventions    06/15/2023   10:32 AM  Readmission Risk Prevention Plan  Transportation Screening Complete  PCP or Specialist Appt within 3-5 Days Complete  HRI or Home Care Consult Complete  Social Work Consult for Recovery Care Planning/Counseling Complete  Palliative Care Screening Not Applicable  Medication Review [employer]) Complete

## 2023-06-16 NOTE — Assessment & Plan Note (Addendum)
 Improved AST, ALT still mildly elevated but stable.  Continue to suspect this is related to influenza infection. -Continue to monitor -Hepatitis panel -AM CMP

## 2023-06-16 NOTE — Assessment & Plan Note (Addendum)
 Atrial fibrillation - Continue home amiodarone 200 mg, hold Eliquis 2.5 mg twice daily Neuropathy - Continue gabapentin 100 mg at bedtime Allergies - Continue Claritin 10 mg daily GERD - Continue pantoprazole 40 mg twice daily Restless leg - Continue ropinirole 0.25 mg at bedtime Hyperlipidemia - Continue rosuvastatin 40 mg daily Constipation - Continue senna 17.2 mg at bedtime, MiraLAX daily

## 2023-06-17 ENCOUNTER — Inpatient Hospital Stay (HOSPITAL_COMMUNITY)

## 2023-06-17 DIAGNOSIS — J9621 Acute and chronic respiratory failure with hypoxia: Secondary | ICD-10-CM | POA: Diagnosis not present

## 2023-06-17 DIAGNOSIS — J111 Influenza due to unidentified influenza virus with other respiratory manifestations: Secondary | ICD-10-CM | POA: Diagnosis not present

## 2023-06-17 LAB — COMPREHENSIVE METABOLIC PANEL
ALT: 40 U/L (ref 0–44)
AST: 37 U/L (ref 15–41)
Albumin: 2.9 g/dL — ABNORMAL LOW (ref 3.5–5.0)
Alkaline Phosphatase: 43 U/L (ref 38–126)
Anion gap: 4 — ABNORMAL LOW (ref 5–15)
BUN: 21 mg/dL (ref 8–23)
CO2: 31 mmol/L (ref 22–32)
Calcium: 8 mg/dL — ABNORMAL LOW (ref 8.9–10.3)
Chloride: 97 mmol/L — ABNORMAL LOW (ref 98–111)
Creatinine, Ser: 0.98 mg/dL (ref 0.44–1.00)
GFR, Estimated: 56 mL/min — ABNORMAL LOW (ref >60)
Glucose, Bld: 201 mg/dL — ABNORMAL HIGH (ref 70–99)
Potassium: 3.8 mmol/L (ref 3.5–5.1)
Sodium: 132 mmol/L — ABNORMAL LOW (ref 135–145)
Total Bilirubin: 0.3 mg/dL (ref 0.0–1.2)
Total Protein: 5.5 g/dL — ABNORMAL LOW (ref 6.5–8.1)

## 2023-06-17 LAB — HEPATITIS PANEL, ACUTE
HCV Ab: NONREACTIVE
Hep A IgM: REACTIVE — AB
Hep B C IgM: NONREACTIVE
Hepatitis B Surface Ag: NONREACTIVE

## 2023-06-17 LAB — CBC
HCT: 28.4 % — ABNORMAL LOW (ref 36.0–46.0)
Hemoglobin: 9.2 g/dL — ABNORMAL LOW (ref 12.0–15.0)
MCH: 30.4 pg (ref 26.0–34.0)
MCHC: 32.4 g/dL (ref 30.0–36.0)
MCV: 93.7 fL (ref 80.0–100.0)
Platelets: 174 10*3/uL (ref 150–400)
RBC: 3.03 MIL/uL — ABNORMAL LOW (ref 3.87–5.11)
RDW: 15.2 % (ref 11.5–15.5)
WBC: 5 10*3/uL (ref 4.0–10.5)
nRBC: 0 % (ref 0.0–0.2)

## 2023-06-17 LAB — GLUCOSE, CAPILLARY
Glucose-Capillary: 265 mg/dL — ABNORMAL HIGH (ref 70–99)
Glucose-Capillary: 225 mg/dL — ABNORMAL HIGH (ref 70–99)
Glucose-Capillary: 183 mg/dL — ABNORMAL HIGH (ref 70–99)
Glucose-Capillary: 179 mg/dL — ABNORMAL HIGH (ref 70–99)

## 2023-06-17 LAB — MAGNESIUM: Magnesium: 2 mg/dL (ref 1.7–2.4)

## 2023-06-17 MED ORDER — INSULIN GLARGINE 100 UNIT/ML ~~LOC~~ SOLN
3.0000 [IU] | SUBCUTANEOUS | Status: AC
  Administered 2023-06-17: 3 [IU] via SUBCUTANEOUS
  Filled 2023-06-17: qty 0.03

## 2023-06-17 MED ORDER — INSULIN GLARGINE 100 UNIT/ML ~~LOC~~ SOLN
8.0000 [IU] | Freq: Every day | SUBCUTANEOUS | Status: DC
  Administered 2023-06-18: 8 [IU] via SUBCUTANEOUS
  Filled 2023-06-17: qty 0.08

## 2023-06-17 MED ORDER — FUROSEMIDE 40 MG PO TABS
60.0000 mg | ORAL_TABLET | Freq: Every day | ORAL | Status: DC
  Administered 2023-06-17 – 2023-06-20 (×4): 60 mg via ORAL
  Filled 2023-06-17 (×4): qty 1

## 2023-06-17 MED ORDER — FUROSEMIDE 10 MG/ML IJ SOLN
80.0000 mg | Freq: Once | INTRAMUSCULAR | Status: AC
  Administered 2023-06-17: 80 mg via INTRAVENOUS
  Filled 2023-06-17: qty 8

## 2023-06-17 MED ORDER — POTASSIUM CHLORIDE CRYS ER 20 MEQ PO TBCR
40.0000 meq | EXTENDED_RELEASE_TABLET | Freq: Once | ORAL | Status: AC
  Administered 2023-06-17: 40 meq via ORAL
  Filled 2023-06-17 (×2): qty 2

## 2023-06-17 MED ORDER — NYSTATIN 100000 UNIT/GM EX POWD
Freq: Two times a day (BID) | CUTANEOUS | Status: DC
  Filled 2023-06-17: qty 15

## 2023-06-17 NOTE — Assessment & Plan Note (Addendum)
 Foley catheter in place. -Appreciate Urology input: -Foley trail with serial string of bottles until discontinued -Bladder scans Q8h afterwards x24 hours to ensure no retention

## 2023-06-17 NOTE — Plan of Care (Signed)
 Received secure chat from RN "hi- pt with increased work of breathing, 90-93% on 4L O2. tachypneic - just gave her tylenol for right breast pain. I am going to call RT for tx."  Went to evaluate patient at bedside.  Patient with increased work of breathing, on 4 L nasal cannula (baseline 2L) with audible crackles.  She says she feels short of breath.  She also still has right breast pain.  Stat EKG, chest x-ray ordered.  IV Lasix 80 mg ordered.  Patient's IV was lost, IV team was called by RN to replace.  Once IV was placed Lasix was administered. EKG with no acute changes Awaiting portable chest x-ray Patient tolerated wean to 3 L nasal cannula

## 2023-06-17 NOTE — Assessment & Plan Note (Signed)
 Atrial fibrillation - Continue home amiodarone 200 mg, Eliquis as above Neuropathy - Continue gabapentin 100 mg at bedtime Allergies - Continue Claritin 10 mg daily GERD - Continue pantoprazole 40 mg twice daily Restless leg - Continue ropinirole 0.25 mg at bedtime Hyperlipidemia - Continue rosuvastatin 40 mg daily Constipation - Continue senna 17.2 mg at bedtime, MiraLAX daily

## 2023-06-17 NOTE — Progress Notes (Signed)
 Physical Therapy Treatment Patient Details Name: Danielle [name] MRN: [id number] DOB: [birth year] Today's Date: 06/17/2023   History of Present Illness 89 year old female who presented 3/15 with R leg and breast pain s/p mechanical fall. Admitted with acute on chronic respiratory failure with hypoxemia and found to be influenza A+.  PMHx: COPD, chronic respiratory failure with hypoxia on home oxygen, coronary artery disease, morbid obesity, diabetes mellitus type 2, chronic kidney disease stage IIIa, hyperlipidemia.    PT Comments  Pt resting in bed on arrival and agreeable to bed level session as pt stating fatigue as she has just returned to bed from [facility]. Pt educated on IS and flutter valve proper use with pt able to demo back both instruments with proper technique with cues, pt pulling up to on IS. Pt verbalizing understanding of importance of use and frequency. Pt educated on importance of frequent mobilization and time up OOB with pt verbalizing understanding. Pt continues to benefit from skilled PT services to progress toward functional mobility goals.     If plan is discharge home, recommend the following: A little help with walking and/or transfers;A little help with bathing/dressing/bathroom;Assistance with cooking/housework;Direct supervision/assist for medications management;Direct supervision/assist for financial management;Assist for transportation;Help with stairs or ramp for entrance   Can travel by private vehicle     Yes  Equipment Recommendations  None recommended by PT    Recommendations for Other Services       Precautions / Restrictions Precautions Precautions: Fall;Other (comment) Recall of Precautions/Restrictions: Intact Precaution/Restrictions Comments: Watch O2 sats Restrictions Weight Bearing Restrictions Per Provider Order: No     Mobility  Bed Mobility Overal bed mobility: Needs Assistance             General bed mobility comments: pt  supine on arrival, total A to scoot to HOB in supine    Transfers Overall transfer level: Needs assistance                 General transfer comment: pt declining trasnfers stating she jsut got back to bed from [facility]    Ambulation/Gait                   Stairs             Wheelchair Mobility     Tilt Bed    Modified Rankin (Stroke Patients Only)       Balance Overall balance assessment: Needs assistance                                          Communication Communication Communication: No apparent difficulties Factors Affecting Communication: Reduced clarity of speech  Cognition Arousal: Alert Behavior During Therapy: WFL for tasks assessed/performed   PT - Cognitive impairments: Safety/Judgement                         Following commands: Intact      Cueing Cueing Techniques: Verbal cues  Exercises Other Exercises Other Exercises: educated pt on IS use with pt able to demonstrate x10 pulling up to , educated on importance and frequency Other Exercises: educated on flutter valve with pt able to return demo with 5 breaths and cough, educated on importance of use and frequency    General Comments General comments (skin integrity, edema, etc.): VSS on 2L      Pertinent Vitals/Pain Pain Assessment  Pain Assessment: Faces Faces Pain Scale: Hurts even more Pain Location: R flank and breast Pain Descriptors / Indicators: Discomfort, Sore, Grimacing, Guarding Pain Intervention(s): Patient requesting pain meds-RN notified, Monitored during session, Limited activity within patient's tolerance    Home Living                          Prior Function            PT Goals (current goals can now be found in the care plan section) Acute Rehab PT Goals Patient Stated Goal: decrease pain PT Goal Formulation: With patient/family Time For Goal Achievement: 06/28/23 Progress towards PT goals: Progressing  toward goals    Frequency    Min 2X/week      PT Plan      Co-evaluation              AM-PAC PT "6 Clicks" Mobility   Outcome Measure  Help needed turning from your back to your side while in a flat bed without using bedrails?: A Lot Help needed moving from lying on your back to sitting on the side of a flat bed without using bedrails?: A Lot Help needed moving to and from a bed to a chair (including a wheelchair)?: A Little Help needed standing up from a chair using your arms (e.g., wheelchair or bedside chair)?: A Little Help needed to walk in hospital room?: A Lot Help needed climbing 3-5 steps with a railing? : Total 6 Click Score: 13    End of Session Equipment Utilized During Treatment: Oxygen Activity Tolerance: Patient limited by fatigue;Patient limited by pain Patient left: with call bell/phone within reach;in bed;with nursing/sitter in room;with family/visitor present Nurse Communication: Patient requests pain meds PT Visit Diagnosis: Unsteadiness on feet (R26.81);Other abnormalities of gait and mobility (R26.89);Muscle weakness (generalized) (M62.81);History of falling (Z91.81);Difficulty in walking, not elsewhere classified (R26.2);Pain Pain - Right/Left: Right Pain - part of body: Knee (R breast area)     Time: 8119-1478 PT Time Calculation (min) (ACUTE ONLY): 10 min  Charges:    $Therapeutic Exercise: 8-22 mins PT General Charges $$ ACUTE PT VISIT: 1 Visit                     Danielle [name] R. PTA Acute Rehabilitation Services Office: [phone]   Catalina Antigua 06/17/2023, 5:03 PM

## 2023-06-17 NOTE — Progress Notes (Signed)
    Subjective: Patient in bed on rounds complaining of breast pain.    Objective: Vital signs in last 24 hours: Temp:  [97.6 F (36.4 C)-98.3 F (36.8 C)] 98.2 F (36.8 C) (03/19 0905) Pulse Rate:  [63-76] 76 (03/19 0905) Resp:  [17-28] 28 (03/19 0430) BP: (107-159)/(53-91) 159/53 (03/19 0905) SpO2:  [94 %-99 %] 95 % (03/19 0905) Weight:  [109.6 kg] 109.6 kg (03/19 0437)  Assessment/Plan: #Hematuria Clear yellow urine.  No evidence of active bleeding. All old blood washed out.   Receiving twice daily Eliquis.   No bacteria, nitrites, or significant volume of WBCs noted on urinalysis.  Hematuria unlikely to be 2/2 hemorrhagic cystitis. Possibly related to fall.  Recent cystoscopy in the last few months was unremarkable. Low yield for repeat at this time. Will still monitor UA from office.   Reviewed the need to remove the foley again today. Pt was amenable.  Urology will sign off at this time. Please call with questions or acute changes.   Intake/Output from previous day: 03/18 0701 - 03/19 0700 In: -  Out: 2050 [Urine:2050]  Intake/Output this shift: Total I/O In: -  Out: 1200 [Urine:1200]  Physical Exam:  General: Alert and oriented CV: No cyanosis Lungs: equal chest rise Gu: Foley catheter in place with clear urine.  Lab Results: Recent Labs    06/16/23 0532 06/16/23 1551 06/17/23 0535  HGB 9.7* 9.3* 9.2*  HCT 30.7* 29.5* 28.4*   BMET Recent Labs    06/16/23 0532 06/17/23 0535  NA 135 132*  K 4.0 3.8  CL 99 97*  CO2 27 31  GLUCOSE 164* 201*  BUN 26* 21  CREATININE 0.93 0.98  CALCIUM 8.6* 8.0*     Studies/Results: DG CHEST PORT 1 VIEW Result Date: 06/17/2023 CLINICAL DATA:  Productive cough. EXAM: PORTABLE CHEST 1 VIEW COMPARISON:  06/13/2023 FINDINGS: Left base collapse/consolidation noted with probable tiny bilateral pleural effusions. Right lung clear. No acute bony abnormality. Telemetry leads overlie the chest. IMPRESSION: Left base  collapse/consolidation with probable tiny bilateral pleural effusions. Electronically Signed   By: Kennith Center M.D.   On: 06/17/2023 06:56      LOS: 4 days   Elmon Kirschner, NP [facility] Pager: [phone]  06/17/2023, 1:14 PM

## 2023-06-17 NOTE — Assessment & Plan Note (Signed)
 Resolved, likely 2/2 influenza. -Hepatitis panel unremarkable

## 2023-06-17 NOTE — Assessment & Plan Note (Addendum)
 Hematuria resolving, urine nearly clear this AM.  Hgb stable on Eliquis.  Renal US negative for contusion. -Urology consulted, appreciate recommendations -AM CBC

## 2023-06-17 NOTE — Assessment & Plan Note (Signed)
 Favor combination of intertrigo and MSK pain. -Nystatin topical powder for intertrigo. -R shoulder XR to ensure no fracture given referred pain

## 2023-06-17 NOTE — Progress Notes (Signed)
 Pt continues on 3L O2, nasal cannula, 400 ml urine output noted after IV lasix.  Order noted for foley to discontinue this morning and order verified with Dr. Rexene Alberts. Per MD to keep foley at this time. Report given day nurse.   Safety maintained. Will continue to monitor.

## 2023-06-17 NOTE — Care Plan (Signed)
 Re-evaluated patient.  Still A&O. IV placed by ultrasound team. IV Lasix administered.  Still awaiting portable STAT CXR.  Darral Dash, DO

## 2023-06-17 NOTE — Assessment & Plan Note (Signed)
 Right knee x-ray and CXR both without evidence of fracture.  Patient continues to endorse severe pain to right breast this AM. -PT/OT eval and treat -Tylenol 650 mg Q6h [facility] -Oxycodone 2.5 mg Q8 PRN for severe pain

## 2023-06-17 NOTE — Assessment & Plan Note (Addendum)
 Increased to 3L [place], suspect atelectasis and pain contributing to tachypnea overnight. -DuoNeb BID, albuterol neb Q4h PRN -Continue home Dulera 2 puffs BID -Tamiflu BID, renally dosed x5 days (3/15-3/19) -Incentive spirometry -OOB to chair for meals -Follow-up blood cultures, NGTD at 4 days -2 view CXR chest to evaluate for consolidation -SLP eval

## 2023-06-17 NOTE — Progress Notes (Addendum)
 Daily Progress Note Intern Pager: [phone]  Patient name: Danielle [name] Medical record number: [id number] Date of birth: 1933/06/10 Age: 89 y.o. Gender: female  Primary Care Provider: Gordan Payment., MD Consultants: None Code Status: DNR limited  Pt Overview and Major Events to Date: 3/15 - Admitted, Foley placed, imaging negative for fracture 3/17 - Heparin restarted with Urology agreement 3/19 - Overnight tachypnea, crackles, small pleural effusions  Assessment and Plan: Danielle [name] is a 89 y.o. female with a pertinent PMH of T2DM, HFpEF, OSA, CKD 3 8, and A-fib on Eliquis who was admitted with AHRF 2/2 influenza A and fall, currently working on respiratory status and pain control.  Magnesium add-on, replete K with KCl 40 mEq PO in setting of possible CHF exacerbation and increased Lasix dosing.  Patient did have deterioration in respiratory status overnight, likely in setting of atelectasis secondary to poor mobility, tachypneic shallow breathing 2/2 pain, and influenza.  Suspect mobilization, additional Lasix, and incentive spirometry will help.  However, patient does receive pain medications which sedate her somewhat and likely has baseline dementia, making compliance with these interventions more difficult.  Will continue current pain management and encourage mobilization as able.  Noted left base collapse/consolidation on CXR read, however no concern for pneumonia given that patient is afebrile without leukocytosis or other infectious symptoms.  Will repeat CXR with 2 views to evaluate further and have SLP evaluate patient for throat gargling noises. Assessment & Plan Acute on chronic respiratory failure with hypoxemia (HCC)  Influenza A Increased to 3L [place], suspect atelectasis and pain contributing to tachypnea overnight. -DuoNeb BID, albuterol neb Q4h PRN -Continue home Dulera 2 puffs BID -Tamiflu BID, renally dosed x5 days (3/15-3/19) -Incentive  spirometry -OOB to chair for meals -Follow-up blood cultures, NGTD at 4 days -2 view CXR chest to evaluate for consolidation -SLP eval (HFpEF) heart failure with preserved ejection fraction (HCC) EF 60-65% November 2023. -Strict I&Os, daily weights -Holding home Spironolactone 12.5 mg -Start Lasix 60 mg oral daily -Daily BMP, Mag; goal K>4, Mag>2 Breast pain, right Favor combination of intertrigo and MSK pain. -Nystatin topical powder for intertrigo. -R shoulder XR to ensure no fracture given referred pain Urinary retention Foley catheter in place. -Appreciate Urology input: -Foley trail with serial string of bottles until discontinued -Bladder scans Q8h afterwards x24 hours to ensure no retention Hematuria, gross Hematuria resolving, urine nearly clear this AM.  Hgb stable on Eliquis.  Renal US negative for contusion. -Urology consulted, appreciate recommendations -AM CBC Fall  R breast pain  R knee pain Right knee x-ray and CXR both without evidence of fracture.  Patient continues to endorse severe pain to right breast this AM. -PT/OT eval and treat -Tylenol 650 mg Q6h [facility] -Oxycodone 2.5 mg Q8 PRN for severe pain Type 2 diabetes mellitus (HCC) A1c 7.4.  CBGs ranging in mid-200s, 13 units SA past 24 hours. Home regimen: Glimepiride 4 mg daily, metformin 500 mg daily, and Januvia 100 mg daily. -Increased to mSSI overnight -Increase to 8 units LAI daily -Linagliptin 5 mg daily -CBGs ACHS Elevated transaminase level (Resolved: 06/17/2023) Resolved, likely 2/2 influenza. -Hepatitis panel unremarkable Chronic health problem Atrial fibrillation - Continue home amiodarone 200 mg, Eliquis as above Neuropathy - Continue gabapentin 100 mg at bedtime Allergies - Continue Claritin 10 mg daily GERD - Continue pantoprazole 40 mg twice daily Restless leg - Continue ropinirole 0.25 mg at bedtime Hyperlipidemia - Continue rosuvastatin 40 mg daily Constipation - Continue senna 17.2  mg  at bedtime, MiraLAX daily  FEN/GI: Carb modified diet PPx: Eliquis 2.5 mg twice daily Dispo: DC to [facility] on 3/21 after mandatory 7 day influenza quarantine period.  Subjective:  Overnight, the patient experienced an episode of tachypnea with crackles on exam on increased oxygen of 4 L.  CXR with tiny bilateral pleural effusions and EKG with A-fib unchanged from prior.  Received Lasix 80 mg IV x1 dose and AM PO dose was held.  Upon reevaluation, patient appeared better.  Continuing to complain of right breast pain, oxycodone 2.5 mg held.  Objective: Temp:  [97.6 F (36.4 C)-98.3 F (36.8 C)] 97.6 F (36.4 C) (03/19 0417) Pulse Rate:  [63-69] 66 (03/19 0417) Resp:  [16-28] 28 (03/19 0430) BP: (107-151)/(53-91) 151/91 (03/19 0417) SpO2:  [94 %-99 %] 95 % (03/19 0546) Weight:  [109.6 kg] 109.6 kg (03/19 0437)  Physical Exam: General: Elderly patient, resting in bed in moderate discomfort, alert.  Repeats self frequently. HEENT: MMM. No JVP noted. Making gurgling noises in throat with breathing limited to upper airway. Cardiovascular: Regular rate and rhythm. Normal S1/S2. No murmurs, rubs, or gallops appreciated. 2+ radial pulses. Breast*: Generalized R breast, upper abdomen under breast, and R shoulder pain not localizing with palpation.  No edema or nodules.  No ecchymoses or hematomas.  Intertrigo under R breast. Pulmonary: Referred upper airway sounds but no wheezes, crackles, or rhonchi. Mildly increased WOB and tachypnea on 3L [place]. Abdominal: Normoactive bowel sounds, nondistended. No tenderness to deep or light palpation. No rebound or guarding. GU: Foley draining yellow urine, no clots. Skin: Warm and dry.  Scattered ecchymoses on extremities, thin papery skin.  Intertrigo as above. Extremities: 1+ pitting peripheral edema bilaterally. Capillary refill <2 seconds. Neuro: Alert to self and place, but not time and only somewhat situation.  Forgetful, repeats self asking for meds  already given.  [name], RN present for breast exam as chaperone.  Laboratory: Most recent CBC Lab Results  Component Value Date   WBC 5.0 06/17/2023   HGB 9.2 (L) 06/17/2023   HCT 28.4 (L) 06/17/2023   MCV 93.7 06/17/2023   PLT 174 06/17/2023   Most recent BMP    Latest Ref Rng & Units 06/17/2023    5:35 AM  BMP  Glucose 70 - 99 mg/dL 161   BUN 8 - 23 mg/dL 21   Creatinine 0.96 - 1.00 mg/dL 0.45   Sodium 409 - 811 mmol/L 132   Potassium 3.5 - 5.1 mmol/L 3.8   Chloride 98 - 111 mmol/L 97   CO2 22 - 32 mmol/L 31   Calcium 8.9 - 10.3 mg/dL 8.0     Other pertinent labs: CBG (last 3)  Recent Labs    06/16/23 1202 06/16/23 1649 06/16/23 2028  GLUCAP 229* 184* 243*    New Imaging/Diagnostic Tests: -EKG: A-fib, QTcB 455, no ischemic changes, normal rate. -CXR: Tiny bilateral pleural effusions, virtually unchanged from prior 2 days ago  Nelia Shi, MD 06/17/2023, 7:15 AM  PGY-1, [facility] FPTS Intern pager: [phone], text pages welcome Secure chat group [facility] Teaching Service

## 2023-06-17 NOTE — Care Plan (Addendum)
 Evaluated patient at bedside, tachypneic with obvious volume overload.  Crackles on lung exam.  A&O x4.  STAT EKG, STAT CXR, IV ultrasound team to replace IV and IV Lasix 80 mg x1 ordered.  Reynold Bowen PGY-3 [facility] Family Medicine

## 2023-06-17 NOTE — Assessment & Plan Note (Addendum)
 EF 60-65% November 2023. -Strict I&Os, daily weights -Holding home Spironolactone 12.5 mg -Start Lasix 60 mg oral daily -Daily BMP, Mag; goal K>4, Mag>2

## 2023-06-17 NOTE — Progress Notes (Signed)
 MEWS Progress Note  Patient Details Name: Danielle [name] MRN: [id number] DOB: 07-16-1933 Today's Date: 06/17/2023   MEWS Flowsheet Documentation:  Assess: MEWS Score Temp: 97.6 F (36.4 C) BP: (!) 151/91 MAP (mmHg): 109 Pulse Rate: 66 ECG Heart Rate: 67 Resp: (!) 28 Level of Consciousness: Alert SpO2: 95 % O2 Device: Nasal Cannula O2 Flow Rate (L/min): 4 L/min Assess: MEWS Score MEWS Temp: 0 MEWS Systolic: 0 MEWS Pulse: 0 MEWS RR: 2 MEWS LOC: 0 MEWS Score: 2 MEWS Score Color: Yellow Assess: SIRS CRITERIA SIRS Temperature : 0 SIRS Respirations : 1 SIRS Pulse: 0 SIRS WBC: 0 SIRS Score Sum : 1 SIRS Temperature : 0 SIRS Pulse: 0 SIRS Respirations : 1 SIRS WBC: 0 SIRS Score Sum : 1 Assess: if the MEWS score is Yellow or Red Were vital signs accurate and taken at a resting state?: Yes Does the patient meet 2 or more of the SIRS criteria?: No Does the patient have a confirmed or suspected source of infection?: Yes MEWS guidelines implemented : Yes, yellow Treat MEWS Interventions: Considered administering scheduled or prn medications/treatments as ordered Take Vital Signs Increase Vital Sign Frequency : Yellow: Q2hr x1, continue Q4hrs until patient remains green for 12hrs Escalate MEWS: Escalate: Yellow: Discuss with charge nurse and consider notifying provider and/or RRT Notify: Charge Nurse/RN Name of Charge Nurse/RN Notified: Argentina Ponder, Consulting civil engineer Provider Notification Provider Name/Title: Carlene Coria, DO Date Provider Notified: 06/17/23 Time Provider Notified: [phone] Method of Notification:  (secure chat) Notification Reason: Other (Comment) (Vitals flagged YELLOW MEWS and pt with tachypneic and increased work of breathing. pt now on 4L O2, nasal cannula.) Provider response: Other (Comment), En route (MD at bedside and new order for EKG and STAT CXR and IV lasix- see emar) Date of Provider Response: 06/17/23 Time of Provider Response:  [id number]      Yashvi Jasinski 06/17/2023, 5:27 AM

## 2023-06-17 NOTE — Care Plan (Signed)
 Re-evaluated patient with Dr. Rexene Alberts.  Much improved s/p IV Lasix. Lungs sound much more clear. Made 400 mL UOP after Lasix administration.  Reviewed portable CXR, no signs of flash pulmonary edema or significantly increased congestion.  Darral Dash, DO

## 2023-06-17 NOTE — Plan of Care (Signed)
 FMTS Interim Progress Note  S: Seen at bedside with Dr. Rexene Alberts.  RN messaged due to concern for desaturation to 80s with movement.  In the room, patient sitting up in chair at bedside.  Audible gurgling with breathing but otherwise normal WOB on 3 L.  Had O2 sat in the 80s with tachycardia above 140 on the monitor, although notably, waveform on monitor was rather erratic.  After replacing SpO2 monitor, waveform normalized as did her O2 sat and heart rate.  Patient denied any changes in her breathing or pain from earlier today.  A/P:  Normal O2 sat and heart rate after replacing SpO2 monitor.  Exam unchanged from prior  No new orders at this time, please let us know if any changes  If has additional desaturations or respiratory distress likely repeat chest x-ray and consider additional diuresis  Vonna Drafts, MD 06/17/2023, 9:43 PM PGY-2, [facility] Family Medicine Service pager [phone]

## 2023-06-17 NOTE — Assessment & Plan Note (Addendum)
 A1c 7.4.  CBGs ranging in mid-200s, 13 units SA past 24 hours. Home regimen: Glimepiride 4 mg daily, metformin 500 mg daily, and Januvia 100 mg daily. -Increased to mSSI overnight -Increase to 8 units LAI daily -Linagliptin 5 mg daily -CBGs ACHS

## 2023-06-17 NOTE — Evaluation (Signed)
 Clinical/Bedside Swallow Evaluation Patient Details  Name: Danielle [name] MRN: [id number] Date of Birth: June 20, 1933  Today's Date: 06/17/2023 Time: SLP Start Time (ACUTE ONLY): 1345 SLP Stop Time (ACUTE ONLY): 1400 SLP Time Calculation (min) (ACUTE ONLY): 15 min  Past Medical History:  Past Medical History:  Diagnosis Date   Anginal pain (HCC)    Arthritis    Asthma    At high risk for falls 11/11/2017   CHF (congestive heart failure) (HCC)    Chronic diastolic congestive heart failure (HCC) 11/11/2015   Chronic insomnia 10/24/2015   Chronic pain of both knees 05/14/2018   Chronic respiratory failure with hypoxia (HCC) 05/05/2017   CKD (chronic kidney disease) stage 3, GFR 30-59 ml/min (HCC) 05/07/2017   COPD (chronic obstructive pulmonary disease) (HCC)    Coronary artery disease    Coronary artery disease involving native coronary artery of native heart without angina pectoris 11/14/2014   Overview:  40% RCA a cardiac catheter in 2014   Current mild episode of major depressive disorder (HCC) 11/14/2014   Depression    DOE (dyspnea on exertion) 09/07/2018   Dysphagia 08/12/2018   Edema 08/12/2018   Elevated blood sugar 11/09/2015   Emphysema lung (HCC)    Flat feet, bilateral 05/30/2020   Gastroesophageal reflux disease without esophagitis 11/11/2017   GERD (gastroesophageal reflux disease)    Headache    History of hiatal hernia    History of kidney stones    Hyperlipidemia    Hypertension    Insomnia    Internal hemorrhoids 11/26/2018   Iron deficiency anemia 08/12/2018   Malaise and fatigue 08/14/2015   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/26/2018   Mesenteric mass 02/19/2015   Midsternal chest pain 11/11/2015   Mixed hyperlipidemia 11/14/2014   Morbid obesity (HCC) 09/07/2018   OSA (obstructive sleep apnea) 12/16/2019   Osteoporosis    Panlobular emphysema (HCC) 11/09/2015   Pneumonia 2013   PONV (postoperative nausea and vomiting)    Positive D-dimer 09/08/2018    Primary osteoarthritis involving multiple joints 08/14/2015   Restless legs syndrome 03/04/2018   Senile purpura (HCC) 08/12/2018   Shortness of breath dyspnea    with exertion   Stasis edema of left lower extremity 04/13/2018   Strain of right shoulder 08/12/2018   Type 2 diabetes mellitus with stage 3 chronic kidney disease (HCC) 08/14/2015   Unstable angina (HCC) 10/30/2018   Vitamin B12 deficiency 04/13/2018   Vitamin D deficiency 02/27/2016   Past Surgical History:  Past Surgical History:  Procedure Laterality Date   ABDOMINAL HYSTERECTOMY     APPENDECTOMY     BREAST SURGERY  40 yrs. ago   growth removed in each breast-benign   CARDIAC CATHETERIZATION     12/14/12 LHC ([facility]): few mild stenotic areas max 40% of ectatic RCA o/w NL coronaries, EF 65%. Med tx.   CATARACT EXTRACTION W/ INTRAOCULAR LENS  IMPLANT, BILATERAL Bilateral 15 yrs ago   CHOLECYSTECTOMY     COLONOSCOPY  04/22/2013   Diverticulosis in the sigmoid colon. Non bleeding internal hemorrhoids. Normal mucosa vascular pattern in the entire examined colon. Three 6 mm polyps n the descending colon. Resected and retrieved.    CYSTOSCOPY WITH RETROGRADE PYELOGRAM, URETEROSCOPY AND STENT PLACEMENT Right 02/14/2023   Procedure: CYSTOSCOPY WITH RETROGRADE PYELOGRAM, URETEROSCOPY AND STENT PLACEMENT;  Surgeon: Joline Maxcy, MD;  Location: [facility] OR;  Service: Urology;  Laterality: Right;   CYSTOSCOPY WITH RETROGRADE PYELOGRAM, URETEROSCOPY AND STENT PLACEMENT Right 03/17/2023   Procedure: CYSTOSCOPY WITH RETROGRADE PYELOGRAM, URETEROSCOPY  AND STENT PLACEMENT;  Surgeon: Joline Maxcy, MD;  Location: [facility] ORS;  Service: Urology;  Laterality: Right;   FOOT SURGERY     LAPAROSCOPIC APPENDECTOMY N/A 02/19/2015   Procedure: EXCISION OF MESENTERIC MASS;  Surgeon: Almond Lint, MD;  Location: [facility] OR;  Service: General;  Laterality: N/A;   REPLACEMENT TOTAL KNEE BILATERAL     HPI:  Danielle [name] is a 89 y.o. female who presented  with respiratory failure due to influenza A as well as fall at home.  Noted to have difficulty managing secreitons, though this improved after lasix per notes. Pt with a pertinent PMH of T2DM, HFpEF, OSA, CKD 3a, and Afib on Eliquis. Pt had a clinical evalution during an admission for RLL pna in 2023. No subjective finding but  MBS recommended due to pt report of choking. MBS not completed due to a cancel for procedure. No OP MBS in chart.    Assessment / Plan / Recommendation  Clinical Impression  Pt demonstrates no immediate signs of aspiration and denies any dysphagia prior to admit. She does have congested breath sounds, but vocal quality is clear throughout assessment. Though she has pain with coughing she is not avoiding coughing and has been try to clear her congestion. Mentation is good at time of assessment and pt able to self feed. Reinforced basic precautions. No need for further SLP intervention at this time.      Aspiration Risk  No limitations    Diet Recommendation Regular;Thin liquid    Liquid Administration via: Cup;Straw Medication Administration: Whole meds with liquid Supervision: Patient able to self feed Compensations: Minimize environmental distractions Postural Changes: Seated upright at 90 degrees    Other  Recommendations      Recommendations for follow up therapy are one component of a multi-disciplinary discharge planning process, led by the attending physician.  Recommendations may be updated based on patient status, additional functional criteria and insurance authorization.  Follow up Recommendations No SLP follow up      Assistance Recommended at Discharge    Functional Status Assessment    Frequency and Duration            Prognosis        Swallow Study   General HPI: Danielle [name] is a 89 y.o. female who presented with respiratory failure due to influenza A as well as fall at home.  Noted to have difficulty managing secreitons, though  this improved after lasix per notes. Pt with a pertinent PMH of T2DM, HFpEF, OSA, CKD 3a, and Afib on Eliquis. Pt had a clinical evalution during an admission for RLL pna in 2023. No subjective finding but  MBS recommended due to pt report of choking. MBS not completed due to a cancel for procedure. No OP MBS in chart. Type of Study: Bedside Swallow Evaluation Previous Swallow Assessment: see HPI Diet Prior to this Study: Regular;Thin liquids (Level 0) Temperature Spikes Noted: No Respiratory Status: Nasal cannula History of Recent Intubation: No Behavior/Cognition: Alert;Cooperative;Pleasant mood Oral Cavity Assessment: Within Functional Limits Oral Care Completed by SLP: No Oral Cavity - Dentition: Edentulous Vision: Functional for self-feeding Self-Feeding Abilities: Able to feed self Patient Positioning: Upright in bed Baseline Vocal Quality: Normal Volitional Cough: Strong Volitional Swallow: Able to elicit    Oral/Motor/Sensory Function Overall Oral Motor/Sensory Function: Within functional limits   Ice Chips Ice chips: Within functional limits   Thin Liquid Thin Liquid: Within functional limits Presentation: Straw;Cup;Self Fed    Nectar Thick Nectar Thick  Liquid: Not tested   Honey Thick Honey Thick Liquid: Not tested   Puree Puree: Within functional limits Presentation: Spoon   Solid     Solid: Not tested      Claudine Mouton 06/17/2023,2:23 PM

## 2023-06-17 NOTE — Progress Notes (Signed)
 MEWS Progress Note  Patient Details Name: Danielle [name] MRN: [id number] DOB: 01/16/1934 Today's Date: 06/17/2023   MEWS Flowsheet Documentation:  Assess: MEWS Score Temp: 98 F (36.7 C) BP: (!) 148/116 MAP (mmHg): 127 Pulse Rate: 66 ECG Heart Rate: 67 Resp: (!) 28 (pt with increase work of breathing, sound congested and tachypneic) Level of Consciousness: Alert SpO2: 95 % O2 Device: Nasal Cannula O2 Flow Rate (L/min): 3 L/min Assess: MEWS Score MEWS Temp: 0 MEWS Systolic: 0 MEWS Pulse: 0 MEWS RR: 2 MEWS LOC: 0 MEWS Score: 2 MEWS Score Color: Yellow Assess: SIRS CRITERIA SIRS Temperature : 0 SIRS Respirations : 1 SIRS Pulse: 0 SIRS WBC: 0 SIRS Score Sum : 1 SIRS Temperature : 0 SIRS Pulse: 0 SIRS Respirations : 1 SIRS WBC: 0 SIRS Score Sum : 1 Assess: if the MEWS score is Yellow or Red Were vital signs accurate and taken at a resting state?: Yes Does the patient meet 2 or more of the SIRS criteria?: No Does the patient have a confirmed or suspected source of infection?: Yes MEWS guidelines implemented : Yes, yellow Treat MEWS Interventions: Considered administering scheduled or prn medications/treatments as ordered Take Vital Signs Increase Vital Sign Frequency : Yellow: Q2hr x1, continue Q4hrs until patient remains green for 12hrs Escalate MEWS: Escalate: Yellow: Discuss with charge nurse and consider notifying provider and/or RRT     Pt A&O x 4. BP elevated, noted pt with increased work of breathing, dyspneic at rest, tachypenic, and O2 sats  desat to 85-88% on 3L O2 nasal cannula with simple exertion. Dr. Beatrix Shipper and Dr. Paulette Blanch came at bedside to evaluate and no new orders at this time.   Purewick placed per order for pt comfort at this time.  Pt has not voided on this shift and Bladder scan done as ordered and showed 118 ml.  Pt positioned for comfort  in bed and resting at time.  Safety maintained. Bed alarm on. Call bell in reach. Will  continue to monitor.       [name] 06/17/2023, 10:42 PM

## 2023-06-18 ENCOUNTER — Inpatient Hospital Stay (HOSPITAL_COMMUNITY)

## 2023-06-18 DIAGNOSIS — J9621 Acute and chronic respiratory failure with hypoxia: Secondary | ICD-10-CM | POA: Diagnosis not present

## 2023-06-18 LAB — CULTURE, BLOOD (ROUTINE X 2)
Culture: NO GROWTH
Culture: NO GROWTH

## 2023-06-18 LAB — CBC
HCT: 28.4 % — ABNORMAL LOW (ref 36.0–46.0)
Hemoglobin: 9.2 g/dL — ABNORMAL LOW (ref 12.0–15.0)
MCH: 29.9 pg (ref 26.0–34.0)
MCHC: 32.4 g/dL (ref 30.0–36.0)
MCV: 92.2 fL (ref 80.0–100.0)
Platelets: 189 10*3/uL (ref 150–400)
RBC: 3.08 MIL/uL — ABNORMAL LOW (ref 3.87–5.11)
RDW: 15 % (ref 11.5–15.5)
WBC: 6.2 10*3/uL (ref 4.0–10.5)
nRBC: 0 % (ref 0.0–0.2)

## 2023-06-18 LAB — BASIC METABOLIC PANEL
Anion gap: 9 (ref 5–15)
BUN: 16 mg/dL (ref 8–23)
CO2: 31 mmol/L (ref 22–32)
Calcium: 8.3 mg/dL — ABNORMAL LOW (ref 8.9–10.3)
Chloride: 94 mmol/L — ABNORMAL LOW (ref 98–111)
Creatinine, Ser: 0.91 mg/dL (ref 0.44–1.00)
GFR, Estimated: >60 mL/min (ref >60)
Glucose, Bld: 162 mg/dL — ABNORMAL HIGH (ref 70–99)
Potassium: 4 mmol/L (ref 3.5–5.1)
Sodium: 134 mmol/L — ABNORMAL LOW (ref 135–145)

## 2023-06-18 LAB — GLUCOSE, CAPILLARY
Glucose-Capillary: 226 mg/dL — ABNORMAL HIGH (ref 70–99)
Glucose-Capillary: 187 mg/dL — ABNORMAL HIGH (ref 70–99)
Glucose-Capillary: 170 mg/dL — ABNORMAL HIGH (ref 70–99)
Glucose-Capillary: 129 mg/dL — ABNORMAL HIGH (ref 70–99)

## 2023-06-18 LAB — MAGNESIUM: Magnesium: 2.1 mg/dL (ref 1.7–2.4)

## 2023-06-18 LAB — TROPONIN I (HIGH SENSITIVITY)
Troponin I (High Sensitivity): 15 ng/L (ref <18)
Troponin I (High Sensitivity): 15 ng/L (ref <18)

## 2023-06-18 MED ORDER — METFORMIN HCL 500 MG PO TABS
1000.0000 mg | ORAL_TABLET | Freq: Every day | ORAL | Status: DC
  Administered 2023-06-18 – 2023-06-20 (×3): 1000 mg via ORAL
  Filled 2023-06-18 (×3): qty 2

## 2023-06-18 MED ORDER — INSULIN GLARGINE 100 UNIT/ML ~~LOC~~ SOLN
10.0000 [IU] | Freq: Every day | SUBCUTANEOUS | Status: DC
  Administered 2023-06-19 – 2023-06-20 (×2): 10 [IU] via SUBCUTANEOUS
  Filled 2023-06-18 (×2): qty 0.1

## 2023-06-18 MED ORDER — INSULIN GLARGINE 100 UNIT/ML ~~LOC~~ SOLN
2.0000 [IU] | SUBCUTANEOUS | Status: AC
  Administered 2023-06-18: 2 [IU] via SUBCUTANEOUS
  Filled 2023-06-18: qty 0.02

## 2023-06-18 NOTE — Plan of Care (Addendum)
 Patient continues on 4L O2 nasal cannula and desat's to 80's with exertion, tachypneic and requires upto 5-6L O2 while  transferring to/from bed to [facility].  Sinus rhythm on the telemetry monitor. Safety maintained. Bed alarm on. Call bell in reach. Will continue to monitor.   Problem: Education: Goal: Ability to describe self-care measures that may prevent or decrease complications (Diabetes Survival Skills Education) will improve Outcome: Progressing Goal: Individualized Educational Video(s) Outcome: Progressing   Problem: Coping: Goal: Ability to adjust to condition or change in health will improve Outcome: Progressing   Problem: Fluid Volume: Goal: Ability to maintain a balanced intake and output will improve Outcome: Progressing   Problem: Metabolic: Goal: Ability to maintain appropriate glucose levels will improve Outcome: Progressing   Problem: Nutritional: Goal: Maintenance of adequate nutrition will improve Outcome: Progressing Goal: Progress toward achieving an optimal weight will improve Outcome: Progressing   Problem: Skin Integrity: Goal: Risk for impaired skin integrity will decrease Outcome: Progressing   Problem: Tissue Perfusion: Goal: Adequacy of tissue perfusion will improve Outcome: Progressing   Problem: Education: Goal: Knowledge of General Education information will improve Description: Including pain rating scale, medication(s)/side effects and non-pharmacologic comfort measures Outcome: Progressing   Problem: Clinical Measurements: Goal: Ability to maintain clinical measurements within normal limits will improve Outcome: Progressing Goal: Will remain free from infection Outcome: Progressing Goal: Diagnostic test results will improve Outcome: Progressing Goal: Respiratory complications will improve Outcome: Progressing Goal: Cardiovascular complication will be avoided Outcome: Progressing   Problem: Activity: Goal: Risk for activity  intolerance will decrease Outcome: Progressing   Problem: Nutrition: Goal: Adequate nutrition will be maintained Outcome: Progressing   Problem: Coping: Goal: Level of anxiety will decrease Outcome: Progressing   Problem: Elimination: Goal: Will not experience complications related to bowel motility Outcome: Progressing Goal: Will not experience complications related to urinary retention Outcome: Progressing   Problem: Pain Managment: Goal: General experience of comfort will improve and/or be controlled Outcome: Progressing   Problem: Safety: Goal: Ability to remain free from injury will improve Outcome: Progressing   Problem: Skin Integrity: Goal: Risk for impaired skin integrity will decrease Outcome: Progressing

## 2023-06-18 NOTE — Assessment & Plan Note (Signed)
 Atrial fibrillation - Continue home amiodarone 200 mg, Eliquis as above Neuropathy - Continue gabapentin 100 mg at bedtime Allergies - Continue Claritin 10 mg daily GERD - Continue pantoprazole 40 mg twice daily Restless leg - Continue ropinirole 0.25 mg at bedtime Hyperlipidemia - Continue rosuvastatin 40 mg daily Constipation - Continue senna 17.2 mg at bedtime, MiraLAX daily

## 2023-06-18 NOTE — Progress Notes (Signed)
   06/18/23 0109  Urine Characteristics  Urine Color Tea colored (dark tea colored)  Urine Appearance Sediment  Intermittent/Straight Cath (mL) 500 mL (Dr. Beatrix Shipper notified via secure chat)    Pt did not void during this shift and has urge to void, but unable to do. Bladder scan showed 346 ml and In and out cath done as ordered. 500 ml dark,  tea colored urine and MD notified.

## 2023-06-18 NOTE — Assessment & Plan Note (Signed)
 Right knee x-ray, CXR, R shoulder XR without evidence of fracture. -PT/OT eval and treat -Tylenol 650 mg Q6h [facility] -Oxycodone 2.5 mg Q8 PRN for severe pain

## 2023-06-18 NOTE — TOC Progression Note (Signed)
 Transition of Care [facility]) - Progression Note    Patient Details  Name: Danielle [name] MRN: [id number] Date of Birth: 1933-07-03  Transition of Care [facility]) CM/SW Contact  Annalie Wenner A [place], LCSW Phone Number: 06/18/2023, 3:26 PM  Clinical Narrative:     CSW was contacted by Winn Jock at [facility], she stated that pt can still DC with a private room to facility. However, DON said that pt's date of admission must be moved to Saturday. Stated quarantine guidelines, CSW informed them that pt was positive on 06/12/23 per chart review, 7 days being 3/21/ 25, counting from 3/15 still declined to take pt earlier than Friday.   CSW notified provider of updated DC date.    TOC will continue to follow.   Expected Discharge Plan: Skilled Nursing Facility Barriers to Discharge: Continued Medical Work up, SNF Pending bed offer, Insurance Authorization  Expected Discharge Plan and Services       Living arrangements for the past 2 months: Single Family Home                                       Social Determinants of Health (SDOH) Interventions SDOH Screenings   Food Insecurity: No Food Insecurity (06/13/2023)  Housing: Low Risk  (06/13/2023)  Transportation Needs: No Transportation Needs (06/13/2023)  Utilities: Not At Risk (06/13/2023)  Social Connections: Moderately Isolated (06/13/2023)  Tobacco Use: Medium Risk (06/13/2023)    Readmission Risk Interventions    06/15/2023   10:32 AM  Readmission Risk Prevention Plan  Transportation Screening Complete  PCP or Specialist Appt within 3-5 Days Complete  HRI or Home Care Consult Complete  Social Work Consult for Recovery Care Planning/Counseling Complete  Palliative Care Screening Not Applicable  Medication Review Oceanographer) Complete

## 2023-06-18 NOTE — Assessment & Plan Note (Signed)
 EF 60-65% November 2023. -Strict I&Os, daily weights -Holding home Spironolactone 12.5 mg -Start Lasix 60 mg oral daily -Daily BMP, Mag; goal K>4, Mag>2

## 2023-06-18 NOTE — Plan of Care (Signed)
 Notified by nursing staff that patient was having chest pain.  Went to evaluate patient at bedside.  Patient states that her chest pain is central.  She feels " smothered".  O: Cardio: Regular rate, irregular rhythm.  Pain reproducible on exam with light palpation of costal cartilage. Respiratory: CTAB, normal work of breathing on 4 L LFNC GI: Soft, nontender nondistended.  EKG: Atrial fibrillation, no STEMI  A/P: Chest pain similar to previous chest pain.  Highly suspicious for chest wall pain from fall and costochondritis from viral infection.  Reproducible pain. - Troponins - Repeat chest x-ray - If repeat testing remains unremarkable, but not pursue further workup given reproducible exam and stable chest pain over the course of hospitalization.

## 2023-06-18 NOTE — Assessment & Plan Note (Addendum)
 A1c 7.4.  CBGs ranging in mid-100s, fasting 226 this AM.  16 units SA past 24 hours. Home regimen: Glimepiride 4 mg daily, metformin 500 mg daily, and Januvia 100 mg daily. -Increased to mSSI overnight -Restart home metformin 1000 mg -Increase to 10 units LAI daily -Linagliptin 5 mg daily -CBGs ACHS

## 2023-06-18 NOTE — Plan of Care (Signed)

## 2023-06-18 NOTE — Assessment & Plan Note (Addendum)
 Resolved transaminitis.  Possible false positive Hep A IgM, though unclear.  HD notified by lab.  No risk factors identified on history. -Education for family concerning hand hygiene

## 2023-06-18 NOTE — Progress Notes (Signed)
 Pt assessment after call from RN: Pt found on 4 lpm [place] vs 3 lpm earlier in shift with neb treatment. Pt states she uses 02 at home prn and at bedtime. Pt RR 22, HR 60, Sp02 97%. Dim bbs noted, but wet and congested cough. RCP assisted pt with use of flutter device. Resident plan of care note at 2143 recommending CXR and considering additional diuresis if needed.

## 2023-06-18 NOTE — Assessment & Plan Note (Addendum)
 Foley catheter removed and successful void trial.  Patient appears to retain when on PureWick, but will urinate if placed on bedside commode.

## 2023-06-18 NOTE — Assessment & Plan Note (Addendum)
 Hematuria resolved, urine nearly clear this AM.  Hgb stable on Eliquis.  Renal US negative for contusion. -Urology signed off with outpatient follow up planned

## 2023-06-18 NOTE — Assessment & Plan Note (Signed)
 Favor combination of intertrigo and MSK pain. -Nystatin topical powder for intertrigo

## 2023-06-18 NOTE — Progress Notes (Signed)
 Daily Progress Note Intern Pager: [phone]  Patient name: Danielle [name] Medical record number: [id number] Date of birth: 13-Nov-1933 Age: 89 y.o. Gender: female  Primary Care Provider: Gordan Payment., MD Consultants: None Code Status: DNR limited  Pt Overview and Major Events to Date:  3/15 - Admitted, positive for Wednesday, Foley placed, imaging negative for fracture 3/17 - Heparin restarted with Urology agreement 3/19 - Void trial  Assessment and Plan: Danielle [name] is a 89 y.o. female with a pertinent PMH of T2DM, HFpEF, OSA, CKD 3A, A-fib on Eliquis who was admitted for fall and AHRF secondary influenza A, currently undergoing with influenza quarantine prior to transition to SNF planned on 3/21. Assessment & Plan Acute on chronic respiratory failure with hypoxemia (HCC)  Influenza A Increased to 3-4L [place], suspect atelectasis and pain contributing to tachypnea.  SLP eval unremarkable. -DuoNeb BID, albuterol neb Q4h PRN -Continue home Dulera 2 puffs BID -Tamiflu BID, renally dosed x5 days (3/15-3/19) -Incentive spirometry -OOB to chair for meals -Follow-up blood cultures, NGTD at 4 days -2 view CXR chest to evaluate for consolidation (HFpEF) heart failure with preserved ejection fraction (HCC) EF 60-65% November 2023. -Strict I&Os, daily weights -Holding home Spironolactone 12.5 mg -Start Lasix 60 mg oral daily -Daily BMP, Mag; goal K>4, Mag>2 Breast pain, right Favor combination of intertrigo and MSK pain. -Nystatin topical powder for intertrigo. Fall  R side pain Right knee x-ray, CXR, R shoulder XR without evidence of fracture. -PT/OT eval and treat -Tylenol 650 mg Q6h [facility] -Oxycodone 2.5 mg Q8 PRN for severe pain Urinary retention Foley catheter removed and successful void trial.  Patient appears to retain when on PureWick, but will urinate if placed on bedside commode. Type 2 diabetes mellitus (HCC) A1c 7.4.  CBGs ranging in mid-100s, fasting  226 this AM.  16 units SA past 24 hours. Home regimen: Glimepiride 4 mg daily, metformin 500 mg daily, and Januvia 100 mg daily. -Increased to mSSI overnight -Restart home metformin 1000 mg -Increase to 10 units LAI daily -Linagliptin 5 mg daily -CBGs ACHS Hepatitis A Resolved transaminitis.  Possible false positive Hep A IgM, though unclear.  HD notified by lab.  No risk factors identified on history. -Education for family concerning hand hygiene Hematuria, gross (Resolved: 06/18/2023) Hematuria resolved, urine nearly clear this AM.  Hgb stable on Eliquis.  Renal US negative for contusion. -Urology signed off with outpatient follow up planned Chronic health problem Atrial fibrillation - Continue home amiodarone 200 mg, Eliquis as above Neuropathy - Continue gabapentin 100 mg at bedtime Allergies - Continue Claritin 10 mg daily GERD - Continue pantoprazole 40 mg twice daily Restless leg - Continue ropinirole 0.25 mg at bedtime Hyperlipidemia - Continue rosuvastatin 40 mg daily Constipation - Continue senna 17.2 mg at bedtime, MiraLAX daily  FEN/GI: Carb modified diet PPx: Eliquis 2.5 mg twice daily Dispo: Discharge to [facility] on 3/21 after mandatory 7-day influenza quarantine.  Subjective:  Patient denies consuming raw seafood, oysters, or drinking well/potentially contaminated water. Continues to complain of R-sided pain, but improved this morning from prior.  Bladder scan at bedside 100 mL.  Objective: Temp:  [98 F (36.7 C)-98.7 F (37.1 C)] 98 F (36.7 C) (03/20 0334) Pulse Rate:  [62-76] 66 (03/20 0336) Resp:  [19-28] 20 (03/20 0334) BP: (123-160)/(48-130) 123/65 (03/20 0336) SpO2:  [93 %-100 %] 95 % (03/20 0738) Weight:  [111.3 kg] 111.3 kg (03/20 0406)  Physical Exam: General: Chronically ill-appearing, elderly, resting comfortably in  bed, NAD, alert and at baseline. Cardiovascular: Regular rate and rhythm. Normal S1/S2. No murmurs, rubs, or gallops appreciated.  2+ radial pulses. Pulmonary: Referred upper airway sounds. Clear bilaterally to ascultation. No wheezes, crackles, or rhonchi. No increased WOB on 3 L [place]. Abdominal: Normoactive bowel sounds, nondistended. No tenderness to deep or light palpation. No rebound or guarding. No HSM. Skin: Warm and dry.  Scattered purpura on thin, papery skin. Extremities: 1+ peripheral edema bilaterally. Capillary refill <2 seconds.  Laboratory: Most recent CBC Lab Results  Component Value Date   WBC 5.0 06/17/2023   HGB 9.2 (L) 06/17/2023   HCT 28.4 (L) 06/17/2023   MCV 93.7 06/17/2023   PLT 174 06/17/2023   Most recent BMP    Latest Ref Rng & Units 06/17/2023    5:35 AM  BMP  Glucose 70 - 99 mg/dL 161   BUN 8 - 23 mg/dL 21   Creatinine 0.96 - 1.00 mg/dL 0.45   Sodium 409 - 811 mmol/L 132   Potassium 3.5 - 5.1 mmol/L 3.8   Chloride 98 - 111 mmol/L 97   CO2 22 - 32 mmol/L 31   Calcium 8.9 - 10.3 mg/dL 8.0     Other pertinent labs: -Hepatitis panel positive for hep A IgM  New Imaging/Diagnostic Tests: -CXR 3/19: Unchanged LLL base opacity, small suspected pleural effusions. - R shoulder XR: No acute pathology, acromioclavicular degenerative spurring.  Tiger Spieker Sharion Dove, MD 06/18/2023, 7:40 AM  PGY-1, [facility] Family Medicine FPTS Intern pager: [phone], text pages welcome Secure chat group [facility] Teaching Service

## 2023-06-18 NOTE — Assessment & Plan Note (Addendum)
 Increased to 3-4L [place], suspect atelectasis and pain contributing to tachypnea.  SLP eval unremarkable. -DuoNeb BID, albuterol neb Q4h PRN -Continue home Dulera 2 puffs BID -Tamiflu BID, renally dosed x5 days (3/15-3/19) -Incentive spirometry -OOB to chair for meals -Follow-up blood cultures, NGTD at 4 days -2 view CXR chest to evaluate for consolidation

## 2023-06-19 DIAGNOSIS — J9621 Acute and chronic respiratory failure with hypoxia: Secondary | ICD-10-CM | POA: Diagnosis not present

## 2023-06-19 LAB — BASIC METABOLIC PANEL
Anion gap: 8 (ref 5–15)
BUN: 20 mg/dL (ref 8–23)
CO2: 32 mmol/L (ref 22–32)
Calcium: 8.2 mg/dL — ABNORMAL LOW (ref 8.9–10.3)
Chloride: 91 mmol/L — ABNORMAL LOW (ref 98–111)
Creatinine, Ser: 1.14 mg/dL — ABNORMAL HIGH (ref 0.44–1.00)
GFR, Estimated: 46 mL/min — ABNORMAL LOW (ref >60)
Glucose, Bld: 192 mg/dL — ABNORMAL HIGH (ref 70–99)
Potassium: 3.7 mmol/L (ref 3.5–5.1)
Sodium: 131 mmol/L — ABNORMAL LOW (ref 135–145)

## 2023-06-19 LAB — GLUCOSE, CAPILLARY
Glucose-Capillary: 160 mg/dL — ABNORMAL HIGH (ref 70–99)
Glucose-Capillary: 220 mg/dL — ABNORMAL HIGH (ref 70–99)
Glucose-Capillary: 117 mg/dL — ABNORMAL HIGH (ref 70–99)
Glucose-Capillary: 202 mg/dL — ABNORMAL HIGH (ref 70–99)

## 2023-06-19 MED ORDER — METFORMIN HCL 1000 MG PO TABS: 1000.0000 mg | ORAL_TABLET | Freq: Every day | ORAL | Status: DC

## 2023-06-19 MED ORDER — CITALOPRAM HYDROBROMIDE 20 MG PO TABS
20.0000 mg | ORAL_TABLET | Freq: Every day | ORAL | Status: DC
  Administered 2023-06-19 – 2023-06-20 (×2): 20 mg via ORAL
  Filled 2023-06-19 (×2): qty 1

## 2023-06-19 MED ORDER — POLYETHYLENE GLYCOL 3350 17 G PO PACK: 17.0000 g | PACK | Freq: Every day | ORAL | Status: DC

## 2023-06-19 MED ORDER — GABAPENTIN 100 MG PO CAPS
100.0000 mg | ORAL_CAPSULE | Freq: Three times a day (TID) | ORAL | Status: DC
  Administered 2023-06-19 – 2023-06-20 (×4): 100 mg via ORAL
  Filled 2023-06-19 (×4): qty 1

## 2023-06-19 MED ORDER — NYSTATIN 100000 UNIT/GM EX POWD: Freq: Two times a day (BID) | CUTANEOUS | Status: DC

## 2023-06-19 MED ORDER — GABAPENTIN 100 MG PO CAPS: 100.0000 mg | ORAL_CAPSULE | Freq: Three times a day (TID) | ORAL | Status: DC

## 2023-06-19 MED ORDER — LIDOCAINE 5 % EX PTCH: 1.0000 | MEDICATED_PATCH | CUTANEOUS | Status: DC

## 2023-06-19 MED ORDER — FUROSEMIDE 20 MG PO TABS: 60.0000 mg | ORAL_TABLET | Freq: Every day | ORAL | Status: DC

## 2023-06-19 MED ORDER — APIXABAN 2.5 MG PO TABS: 2.5000 mg | ORAL_TABLET | Freq: Two times a day (BID) | ORAL | Status: DC

## 2023-06-19 MED ORDER — INSULIN GLARGINE 100 UNIT/ML ~~LOC~~ SOLN: 10.0000 [IU] | Freq: Every day | SUBCUTANEOUS | Status: DC

## 2023-06-19 MED ORDER — DICLOFENAC SODIUM 1 % EX GEL
2.0000 g | Freq: Three times a day (TID) | CUTANEOUS | Status: DC
  Administered 2023-06-19: 2 g via TOPICAL
  Filled 2023-06-19: qty 100

## 2023-06-19 NOTE — Discharge Instructions (Addendum)
 Dear Danielle [name],   Thank you so much for allowing [place] to be part of your care!  You were admitted to [facility] for shortness of breath and increased oxygen requirement due to the flu.  You also had a fall at home, but our imaging did not show any fractures. Your breathing status improved throughout your hospital stay.  POST-HOSPITAL & CARE INSTRUCTIONS  Please let PCP/Specialists know of any changes that were made.  Please see medications section of this packet for any medication changes.   DOCTOR'S APPOINTMENT & FOLLOW UP CARE INSTRUCTIONS  Future Appointments  Date Time Provider Department Center  07/15/2023  1:45 PM McCaughan, Nancy Marus, DPM [facility]  09/08/2023  2:00 PM Rachel Moulds, MD [facility] None  11/12/2023  1:00 PM Stoneking, Danford Bad., MD [facility] None   RETURN PRECAUTIONS: Please return to ED or call EMS if experiencing worsening shortness of breath or if significant fall with occurs again  Take care and be well!  Family Medicine Teaching Service Inpatient Team [facility]  [address] [place], [place] [address] [phone]

## 2023-06-19 NOTE — Assessment & Plan Note (Addendum)
 Favor combination of intertrigo and MSK pain. -Nystatin topical powder for intertrigo

## 2023-06-19 NOTE — Assessment & Plan Note (Signed)
 Foley catheter removed and successful void trial.  Patient appears to retain when on PureWick, but will urinate if placed on bedside commode.

## 2023-06-19 NOTE — Assessment & Plan Note (Signed)
 Resolved transaminitis.  Possible false positive Hep A IgM, though unclear.  HD notified by lab.  No risk factors identified on history. -Education for family concerning hand hygiene

## 2023-06-19 NOTE — Assessment & Plan Note (Addendum)
 Now down to 3L [place], suspect atelectasis.  No evidence of consolidation with repeat 2 view CXR. -DuoNeb BID, albuterol neb Q4h PRN -Continue home Dulera 2 puffs BID -Tamiflu BID, renally dosed x5 days (3/15-3/19) -Incentive spirometry, though difficulty with compliance -OOB to chair for meals -Blood cultures with no growth

## 2023-06-19 NOTE — Plan of Care (Signed)
  Problem: Metabolic: Goal: Ability to maintain appropriate glucose levels will improve Outcome: Progressing   Problem: Nutritional: Goal: Progress toward achieving an optimal weight will improve Outcome: Progressing   Problem: Skin Integrity: Goal: Risk for impaired skin integrity will decrease Outcome: Progressing   Problem: Tissue Perfusion: Goal: Adequacy of tissue perfusion will improve Outcome: Progressing   Problem: Clinical Measurements: Goal: Will remain free from infection Outcome: Progressing Goal: Diagnostic test results will improve Outcome: Progressing Goal: Respiratory complications will improve Outcome: Progressing   Problem: Activity: Goal: Risk for activity intolerance will decrease Outcome: Progressing   Problem: Coping: Goal: Level of anxiety will decrease Outcome: Progressing   Problem: Safety: Goal: Ability to remain free from injury will improve Outcome: Progressing   Problem: Skin Integrity: Goal: Risk for impaired skin integrity will decrease Outcome: Progressing

## 2023-06-19 NOTE — Assessment & Plan Note (Signed)
 EF 60-65% November 2023. -Strict I&Os, daily weights -Holding home Spironolactone 12.5 mg -Start Lasix 60 mg oral daily -Daily BMP, Mag; goal K>4, Mag>2

## 2023-06-19 NOTE — Progress Notes (Signed)
 Mobility Specialist: Progress Note   06/19/23 1157  Mobility  Activity Transferred to/from [facility]  Level of Assistance Contact guard assist, steadying assist  Assistive Device Front wheel walker  Distance Ambulated (ft) 3 ft  Activity Response Tolerated well  Mobility Referral Yes  Mobility visit 1 Mobility  Mobility Specialist Start Time (ACUTE ONLY) 1030  Mobility Specialist Stop Time (ACUTE ONLY) 1036  Mobility Specialist Time Calculation (min) (ACUTE ONLY) 6 min    Pt requesting to transfer from [facility] CG throughout but no physical assistance needed to stand or ambulate short distance to chair. Left in chair with all needs met, call bell in reach.   Maurene Capes Mobility Specialist Please contact via SecureChat or Rehab office at [phone]

## 2023-06-19 NOTE — Assessment & Plan Note (Addendum)
 Right knee x-ray, CXR, R shoulder XR without evidence of fracture.  CT head and neck also unremarkable on admission. -PT/OT eval and treat -Tylenol 650 mg Q6h [facility] -Oxycodone 2.5 mg Q8 PRN for severe pain

## 2023-06-19 NOTE — Progress Notes (Signed)
 Mobility Specialist: Progress Note   06/19/23 1155  Mobility  Activity Ambulated with assistance in room  Level of Assistance Contact guard assist, steadying assist  Assistive Device Front wheel walker  Distance Ambulated (ft) 10 ft  Activity Response Tolerated well  Mobility Referral Yes  Mobility visit 1 Mobility  Mobility Specialist Start Time (ACUTE ONLY) 1000  Mobility Specialist Stop Time (ACUTE ONLY) 1020  Mobility Specialist Time Calculation (min) (ACUTE ONLY) 20 min    Pt was agreeable to mobility session - received in bed. CG for bed mobility for trunk elevation. MinA for STS from bed. CG for ambulation. Ambulated around the bed towards chair but requested to sit on BSC first. Left on [facility] with all needs met, call bell in reach. Daughter in room.   Maurene Capes Mobility Specialist Please contact via SecureChat or Rehab office at [phone]

## 2023-06-19 NOTE — Assessment & Plan Note (Signed)
 A1c 7.4.  CBGs ranging in mid-100s, fasting 160 this AM.  11 units SA past 24 hours. Home regimen: Glimepiride 4 mg daily, metformin 500 mg daily, and Januvia 100 mg daily. -Increased to mSSI overnight -Continue home metformin 1000 mg -10 units LAI daily -Linagliptin 5 mg daily -CBGs ACHS

## 2023-06-19 NOTE — Progress Notes (Signed)
 Daily Progress Note Intern Pager: [phone]  Patient name: Danielle [name] Medical record number: [id number] Date of birth: [birth year] Age: 89 y.o. Gender: female  Primary Care Provider: Gordan Payment., MD Consultants: None Code Status: DNR limited  Pt Overview and Major Events to Date:  3/15 - Admitted, positive for Wednesday, Foley placed, imaging negative for fracture 3/17 - Heparin restarted with Urology agreement 3/19 - Void trial successful  Assessment and Plan: Danielle [name] is a 89 y.o. female with a pertinent PMH of T2DM, HFpEF, OSA, CKD 3A, A-fib on Eliquis who was admitted for AHRF 2/2 influenza A as well as fall, currently completing influenza quarantine with plan to discharge to [facility] 3/22.  Med rec and instructions prepped for discharge tomorrow. Assessment & Plan Acute on chronic respiratory failure with hypoxemia (HCC)  Influenza A Now down to 3L [place], suspect atelectasis.  No evidence of consolidation with repeat 2 view CXR. -DuoNeb BID, albuterol neb Q4h PRN -Continue home Dulera 2 puffs BID -Tamiflu BID, renally dosed x5 days (3/15-3/19) -Incentive spirometry, though difficulty with compliance -OOB to chair for meals -Blood cultures with no growth (HFpEF) heart failure with preserved ejection fraction (HCC) EF 60-65% November 2023. -Strict I&Os, daily weights -Holding home Spironolactone 12.5 mg -Start Lasix 60 mg oral daily -Daily BMP, Mag; goal K>4, Mag>2 Breast pain, right Favor combination of intertrigo and MSK pain. -Nystatin topical powder for intertrigo Fall  R side pain Right knee x-ray, CXR, R shoulder XR without evidence of fracture.  CT head and neck also unremarkable on admission. -PT/OT eval and treat -Tylenol 650 mg Q6h [facility] -Oxycodone 2.5 mg Q8 PRN for severe pain Type 2 diabetes mellitus (HCC) A1c 7.4.  CBGs ranging in mid-100s, fasting 160 this AM.  11 units SA past 24 hours. Home regimen: Glimepiride 4 mg daily, metformin  500 mg daily, and Januvia 100 mg daily. -Increased to mSSI overnight -Continue home metformin 1000 mg -10 units LAI daily -Linagliptin 5 mg daily -CBGs ACHS Hepatitis A Resolved transaminitis.  Possible false positive Hep A IgM, though unclear.  HD notified by lab.  No risk factors identified on history. -Education for family concerning hand hygiene Urinary retention (Resolved: 06/19/2023) Foley catheter removed and successful void trial.  Patient appears to retain when on PureWick, but will urinate if placed on bedside commode. Chronic health problem Atrial fibrillation - Continue home amiodarone 200 mg, Eliquis as above Neuropathy - Continue gabapentin 100 mg at bedtime Allergies - Continue Claritin 10 mg daily GERD - Continue pantoprazole 40 mg twice daily Restless leg - Continue ropinirole 0.25 mg at bedtime Hyperlipidemia - Continue rosuvastatin 40 mg daily Constipation - Continue senna 17.2 mg at bedtime, MiraLAX daily  FEN/GI: Carb modified diet PPx: Eliquis 2.5 mg twice daily Dispo: [facility] on 3/22 to mandatory 7-day influenza quarantine, extended by 1 day per facility.  Subjective:  This AM, patient is sleepy and not complaining of pain.  Daughter is at bedside and has no concerns.  No acute events overnight.  Objective: Temp:  [97.7 F (36.5 C)-98.3 F (36.8 C)] 98.3 F (36.8 C) (03/21 0732) Pulse Rate:  [63-78] 65 (03/21 0732) Resp:  [17-20] 18 (03/21 0732) BP: (115-139)/(55-107) 115/99 (03/21 0732) SpO2:  [85 %-100 %] 91 % (03/21 0732)  Physical Exam: General: Deconditioned, napping comfortably in bed, NAD, alert and at baseline. Cardiovascular: TTP in RU chest.  Irregular rate and rhythm. Normal S1/S2. No murmurs, rubs, or gallops appreciated. 2+ radial pulses.  Pulmonary: Referred upper airway sounds, otherwise clear bilaterally to ascultation. No wheezes, crackles, or rhonchi. Normal WOB on 3 L , no accessory muscle usage. Abdominal: Normoactive bowel  sounds, nondistended. No tenderness to deep or light palpation. No rebound or guarding. Skin: Warm and dry. Extremities: 1+ peripheral edema bilaterally. Capillary refill <2 seconds.  Laboratory: Most recent CBC Lab Results  Component Value Date   WBC 6.2 06/18/2023   HGB 9.2 (L) 06/18/2023   HCT 28.4 (L) 06/18/2023   MCV 92.2 06/18/2023   PLT 189 06/18/2023   Most recent BMP    Latest Ref Rng & Units 06/19/2023    6:01 AM  BMP  Glucose 70 - 99 mg/dL 564   BUN 8 - 23 mg/dL 20   Creatinine 3.32 - 1.00 mg/dL 9.51   Sodium 884 - 166 mmol/L 131   Potassium 3.5 - 5.1 mmol/L 3.7   Chloride 98 - 111 mmol/L 91   CO2 22 - 32 mmol/L 32   Calcium 8.9 - 10.3 mg/dL 8.2     Other pertinent labs: -None  New Imaging/Diagnostic Tests: -None  Skyler Dusing, MD 06/19/2023, 7:42 AM  PGY-1,  Family Medicine FPTS Intern pager: [phone], text pages welcome Secure chat group [facility] Teaching Service

## 2023-06-19 NOTE — Assessment & Plan Note (Signed)
 Atrial fibrillation - Continue home amiodarone 200 mg, Eliquis as above Neuropathy - Continue gabapentin 100 mg at bedtime Allergies - Continue Claritin 10 mg daily GERD - Continue pantoprazole 40 mg twice daily Restless leg - Continue ropinirole 0.25 mg at bedtime Hyperlipidemia - Continue rosuvastatin 40 mg daily Constipation - Continue senna 17.2 mg at bedtime, MiraLAX daily

## 2023-06-19 NOTE — TOC Progression Note (Signed)
 Transition of Care [facility]) - Progression Note    Patient Details  Name: Danielle [name] MRN: [id number] Date of Birth: 28-Jan-1934  Transition of Care [facility]) CM/SW Contact  Michaela Corner, [place] Phone Number: 06/19/2023, 11:58 AM  Clinical Narrative:   CSW was contacted by Winn Jock at [facility] to confirm patients DC for tomorrow. Shantell stated patient can dc to them tomorrow and asked for DC summary by no later than 12:30PM.   TOC will continue to follow.    Expected Discharge Plan: Skilled Nursing Facility Barriers to Discharge: Continued Medical Work up, SNF Pending bed offer, Insurance Authorization  Expected Discharge Plan and Services       Living arrangements for the past 2 months: Single Family Home                                       Social Determinants of Health (SDOH) Interventions SDOH Screenings   Food Insecurity: No Food Insecurity (06/13/2023)  Housing: Low Risk  (06/13/2023)  Transportation Needs: No Transportation Needs (06/13/2023)  Utilities: Not At Risk (06/13/2023)  Social Connections: Moderately Isolated (06/13/2023)  Tobacco Use: Medium Risk (06/13/2023)    Readmission Risk Interventions    06/15/2023   10:32 AM  Readmission Risk Prevention Plan  Transportation Screening Complete  PCP or Specialist Appt within 3-5 Days Complete  HRI or Home Care Consult Complete  Social Work Consult for Recovery Care Planning/Counseling Complete  Palliative Care Screening Not Applicable  Medication Review Oceanographer) Complete

## 2023-06-20 LAB — GLUCOSE, CAPILLARY
Glucose-Capillary: 155 mg/dL — ABNORMAL HIGH (ref 70–99)
Glucose-Capillary: 162 mg/dL — ABNORMAL HIGH (ref 70–99)

## 2023-06-20 NOTE — Assessment & Plan Note (Addendum)
 A1C 7.4 06/14/23. CBGs 100s-low 200s.  - Moderate SSI - Continue Metformin 1000mg  daily - Continue Tradjenta 5mg  daily - CBGs ACHS

## 2023-06-20 NOTE — Plan of Care (Signed)
  Problem: Fluid Volume: Goal: Ability to maintain a balanced intake and output will improve Outcome: Progressing   Problem: Skin Integrity: Goal: Risk for impaired skin integrity will decrease Outcome: Progressing   Problem: Education: Goal: Knowledge of General Education information will improve Description: Including pain rating scale, medication(s)/side effects and non-pharmacologic comfort measures Outcome: Progressing   Problem: Clinical Measurements: Goal: Ability to maintain clinical measurements within normal limits will improve Outcome: Progressing Goal: Will remain free from infection Outcome: Progressing   Problem: Activity: Goal: Risk for activity intolerance will decrease Outcome: Progressing

## 2023-06-20 NOTE — Progress Notes (Signed)
 Daily Progress Note Intern Pager: [phone]  Patient name: Danielle [name] Medical record number: [id number] Date of birth: Nov 08, 1933 Age: 89 y.o. Gender: female  Primary Care Provider: Gordan Payment., MD Consultants: None Code Status: DNR - limited  Pt Overview and Major Events to Date:  3/15 - Admitted 3/19 - Void trial successful  Assessment and Plan:  89 year old female with past medical history type 2 diabetes, HFpEF, OSA, CKD 3 AA, A-fib on Eliquis who was admitted for acute hypoxic respiratory failure in the setting of influenza A along with a fall.  Patient medically stable for discharge today to SNF. Assessment & Plan Acute on chronic respiratory failure with hypoxemia (HCC)  Influenza A Respiratory failure has resolved.  Patient course of Tamiflu.  Much improved as far as respiratory status. - DuoNeb twice daily, albuterol neb every 4 hours as needed - Home Dulera 2 puff BID - Duoneb BID - Incentive spirometry Breast pain, right Chronic issue throughout admission likely due to intertrigo and musculoskeletal from fall - Nystatin topical powder - Tylenol 650 mg every 6 hours - Oxycodone 2.5 mg as needed (HFpEF) heart failure with preserved ejection fraction (HCC) Echo 01/2022: LVEF 60-65%, G1DD. Holding home spironolactone. - Continue Lasix 60 mg daily - Daily BMP, optimize electrolytes - Strict I's/O, daily weights Type 2 diabetes mellitus (HCC) A1C 7.4 06/14/23. CBGs 100s-low 200s.  - Moderate SSI - Continue Metformin 1000mg  daily - Continue Tradjenta 5mg  daily - CBGs ACHS Fall  R side pain (Resolved: 06/20/2023) Right knee x-ray, CXR, R shoulder XR without evidence of fracture.  CT head and neck also unremarkable on admission. -PT/OT eval and treat -Tylenol 650 mg Q6h [facility] -Oxycodone 2.5 mg Q8 PRN for severe pain  Chronic and Stable Problems:  Hepatitis A - Suspected false positive Hep A IgM, no risk factors, transaminitis resolved  A-fib-continue  home amiodarone 200 mg daily, Eliquis 2.5 mg twice daily Allergies-continue Claritin 10 mg daily GERD-continue pantoprazole 40 mg twice daily Restless leg-continue Requip 0.25 mg at bedtime Hyperlipidemia-continue rosuvastatin 40 mg daily Constipation-continue MiraLAX, senna   FEN/GI: Carb modified diet PPx: Eliquis Dispo:SNF today.  Subjective:  Some R breast pain overnight, otherwise no concerns. No complaints.   Objective: Temp:  [98 F (36.7 C)-99 F (37.2 C)] 98.2 F (36.8 C) (03/22 0453) Pulse Rate:  [62-83] 65 (03/22 0453) Resp:  [18-20] 19 (03/22 0453) BP: (96-137)/(57-99) 125/66 (03/22 0453) SpO2:  [85 %-98 %] 95 % (03/22 0453) Weight:  [111.2 kg] 111.2 kg (03/22 0453) Physical Exam: General: Chronically ill appearing, no acute distress Cardiovascular: TTP over R breast, pain reproducible. Irregularly irregular Respiratory: Mild coarse breath sounds at bases. Breathing comfortably on 2L [place] Extremities: 1+ edema BLE  Laboratory: Most recent CBC Lab Results  Component Value Date   WBC 6.2 06/18/2023   HGB 9.2 (L) 06/18/2023   HCT 28.4 (L) 06/18/2023   MCV 92.2 06/18/2023   PLT 189 06/18/2023   Most recent BMP    Latest Ref Rng & Units 06/19/2023    6:01 AM  BMP  Glucose 70 - 99 mg/dL 147   BUN 8 - 23 mg/dL 20   Creatinine 8.29 - 1.00 mg/dL 5.62   Sodium 130 - 865 mmol/L 131   Potassium 3.5 - 5.1 mmol/L 3.7   Chloride 98 - 111 mmol/L 91   CO2 22 - 32 mmol/L 32   Calcium 8.9 - 10.3 mg/dL 8.2     Tandy Grawe, DO 06/20/2023, 6:43 AM  PGY-1, [facility] Family Medicine FPTS Intern pager: [phone], text pages welcome Secure chat group [facility] Teaching Service

## 2023-06-20 NOTE — Plan of Care (Addendum)
 Received secure chat from RN that pt was complaining of R breast pain, 10/10. Evaluated pt at bedside. Pt reports that this is the same right breast pain she has had throughout her admission, and it is unchanged. Tells me that it is from falling at home. She is currently on the bedside commode.  RN reports that pt was complaining of some hesitancy with urination, felt like she had to press on her bladder to completely urinate.  Exam: TTP diffusely over R breast, pain is reproducible.  Irregularly irregular, no murmurs. Mild coarse breath sounds bilateral bases. Much improved from respiratory standpoint   Plan: Strongly suspect her breast pain is musculoskeletal in nature. Has had multiple negative cardiac workups this admission and her pain is unchanged from prior. Pt amenable to waiting one hour until her Tylenol is due to take that for her pain. States she is ready to get back in bed. - scheduled Tylenol 650mg  due in one hour - bladder scan x1 to assess for urinary retention - if retaining >342ml will I/O cath x1

## 2023-06-20 NOTE — Assessment & Plan Note (Deleted)
 Resolved transaminitis.  Possible false positive Hep A IgM, though unclear.  HD notified by lab.  No risk factors identified on history. -Education for family concerning hand hygiene

## 2023-06-20 NOTE — Assessment & Plan Note (Addendum)
 Right knee x-ray, CXR, R shoulder XR without evidence of fracture.  CT head and neck also unremarkable on admission. -PT/OT eval and treat -Tylenol 650 mg Q6h [facility] -Oxycodone 2.5 mg Q8 PRN for severe pain

## 2023-06-20 NOTE — Progress Notes (Signed)
 Mobility Specialist: Progress Note   06/20/23 1221  Mobility  Activity Transferred to/from [facility]  Level of Assistance Contact guard assist, steadying assist  Assistive Device Front wheel walker  Distance Ambulated (ft) 3 ft  Activity Response Tolerated well  Mobility Referral Yes  Mobility visit 1 Mobility  Mobility Specialist Start Time (ACUTE ONLY) 1032  Mobility Specialist Stop Time (ACUTE ONLY) 1042  Mobility Specialist Time Calculation (min) (ACUTE ONLY) 10 min    Pt requesting to transfer from [facility]. CG throughout with pericare assist. C/o R breast pain. Left in bed with all needs met, call bell in reach.   Maurene Capes Mobility Specialist Please contact via SecureChat or Rehab office at [phone]

## 2023-06-20 NOTE — Assessment & Plan Note (Addendum)
 Echo 01/2022: LVEF 60-65%, G1DD. Holding home spironolactone. - Continue Lasix 60 mg daily - Daily BMP, optimize electrolytes - Strict I's/O, daily weights

## 2023-06-20 NOTE — Assessment & Plan Note (Deleted)
 Atrial fibrillation - Continue home amiodarone 200 mg, Eliquis as above Neuropathy - Continue gabapentin 100 mg at bedtime Allergies - Continue Claritin 10 mg daily GERD - Continue pantoprazole 40 mg twice daily Restless leg - Continue ropinirole 0.25 mg at bedtime Hyperlipidemia - Continue rosuvastatin 40 mg daily Constipation - Continue senna 17.2 mg at bedtime, MiraLAX daily

## 2023-06-20 NOTE — Assessment & Plan Note (Addendum)
 Chronic issue throughout admission likely due to intertrigo and musculoskeletal from fall - Nystatin topical powder - Tylenol 650 mg every 6 hours - Oxycodone 2.5 mg as needed

## 2023-06-20 NOTE — Progress Notes (Signed)
 Report called to [facility] RN from [employer]. All questions answered

## 2023-06-20 NOTE — Assessment & Plan Note (Addendum)
 Respiratory failure has resolved.  Patient course of Tamiflu.  Much improved as far as respiratory status. - DuoNeb twice daily, albuterol neb every 4 hours as needed - Home Dulera 2 puff BID - Duoneb BID - Incentive spirometry

## 2023-06-20 NOTE — Discharge Summary (Addendum)
 Family Medicine Teaching [facility] Discharge Summary  Patient name: Danielle [name] Medical record number: [id number] Date of birth: 03-Mar-1934 Age: 89 y.o. Gender: female Date of Admission: 06/13/2023  Date of Discharge: 06/20/23 Admitting Physician: Para March, DO  Primary Care Provider: Gordan Payment., MD Consultants: None  Indication for Hospitalization: respiratory failure  Discharge Diagnoses/Problem List:  Principal Problem for Admission: Respiratory failure 2/2 influenza A Other Problems addressed during stay:  Principal Problem:   Acute on chronic respiratory failure with hypoxemia (HCC)  Influenza A Active Problems:   Influenza   Influenza A   Type 2 diabetes mellitus (HCC)   Hepatitis A   Breast pain, right   Chronic health problem   (HFpEF) heart failure with preserved ejection fraction [facility])    Brief Hospital Course:  Danielle [name] is a 89 y.o. famle with history of CHF, COPD, T2DM, HTN, HLD, CAD, GERD, OSA, CKD3a who presenting to the ED for acute hypoxic respiratory failure and fall.  Acute hypoxic respiratory failure Presented to the ED on 3/14, diagnosed with influenza and started treatment with cefdinir and prednisone, then discharged home.  Morning of 3/15, patient had a fall (see below) and presented to the ED by EMS.  She was found to be hypoxic, requiring 4 L of LFNC (baseline is 2 L at home).  Initial EKG showing T wave inversions, which then resolved on repeat.  CXR not consistent with pneumonia and procal was WNL.  Given influenza infection, patient was started on renally dosed Tamiflu 5-day course (3/15 -3/20).  Patient was restarted on home maintenance inhaler for COPD, however clinical picture did not fit a COPD exacerbation and prednisone/antibiotics were not given.  Additionally, given congestive heart failure, a BNP was obtained which was only mildly elevated at 120.6.  She did receive IV Lasix 80mg  x1 with good output.   During hospitalization, patient remained hemodynamically stable and oxygen was weaned to baseline.   Fall  R breast pain Patient is on Eliquis 5 mg twice daily, had fall on 3/15 at 6 AM after walker reportedly stuck to carpet.  Patient used life alert, son-in-law and ambulance arrived to patient's home, patient was alert and oriented and denied hitting her head or LOC.  CT head/cervical spine unremarkable, pelvic x-ray unremarkable.  Aside from bruising, no evidence of trauma.  Patient did state her right knee was hurting, and x-ray was obtained which was negative for acute findings.  She continued to complain of R sided pain and in particular R breast pain.  Was treated with oxycodone 2.5 mg Q8h PRN and acetaminophen 650 mg Q6h scheduled.  Gabapentin was increased to 100 mg TID prior to discharge given continuing breast pain.  Hematuria  Urinary retention UA on admission showed gross hematuria.  Patient was noted to be retaining urine shortly after admission and Foley catheter was placed.  Urology was consulted, who recommended holding Eliquis for 2 days and started tamsulosin.  Hemoglobin remained stable after initial mild downtrend.  Eliquis was restarted at decreased 2.5 mg dose and hemoglobin continued to remain stable.  Void trial was successful on 3/19 and hematuria resolved.  Urology signed off with plan for outpatient follow up.  Tamsulosin was not continued at discharge.  Elevated troponin Most recent troponin downtrending 113.  Most recent EKG WNL.  Prior team spoke with cardiology who suspected demand ischemia.  Troponin then trended down.  No further intervention was necessary.  Hepatitis A Patient had mild AST/ALT elevation initially on  admission, which gradually resolved and was initally suspected 2/2 influenza A.  However, hepatitis panel was positive for hepatitis A.  No sources of infection could be identified and false positive is possible.  Patient did not experience diarrhea or  other infectious symptoms while hospitalized.  Patient and family were counseled on handwashing prior to discharge.  Other chronic conditions were medically managed with home medications and formulary alternatives as necessary (allergies, restless leg, GERD, HLD, consitpation, peripheral neuropathy).  Follow-up recommendations Repeat BMP and CBC within 1 week to ensure stable Hgb and creatinine Should follow up with Urology (Dr. Margo Aye) outpatient for gross hematuria that occurred during admission. Decreased Eliquis to 2.5 mg BID given hematuria and shared decision-making with family. Increased Metformin to 1000 mg daily, added glargine 10 units daily, discontinued glimepiride  Disposition: SNF  Discharge Condition: Stable  Discharge Exam:  Vitals:   06/20/23 0903 06/20/23 0922  BP:    Pulse:  61  Resp:  (!) 21  Temp:    SpO2: 96% 96%   GEN: Chronically ill-appearing, no acute distress CV: TTP over right breast, irregularly irregular rhythm, regular rate Respiratory: Mild coarse breath sounds at bilateral bases, breathing comfortably on 3 L nasal cannula  Significant Procedures: None  Significant Labs and Imaging:  No results for input(s): "WBC", "HGB", "HCT", "PLT" in the last 48 hours.  Recent Labs  Lab 06/19/23 0601  NA 131*  K 3.7  CL 91*  CO2 32  GLUCOSE 192*  BUN 20  CREATININE 1.14*  CALCIUM 8.2*   06/13/23 CXR IMPRESSION: No acute cardiopulmonary disease.  06/13/23 XR Pelvis IMPRESSION: Negative.  06/13/23 CT Head WO Contrast IMPRESSION: No evidence of acute intracranial or cervical spine injury.  06/13/23 CT C-Spine WO Contrast IMPRESSION: No evidence of acute intracranial or cervical spine injury.  06/13/23 XR R Knee IMPRESSION: *No acute osseous abnormality of the right knee.  06/13/23 CXR IMPRESSION: No active disease.  06/15/23 US Renal IMPRESSION: Nonobstructing 1.5 cm left renal stone. No hydronephrosis.  06/17/23 CXR IMPRESSION: Left  base collapse/consolidation with probable tiny bilateral pleural effusions.  06/17/23 XR R Shoulder IMPRESSION: Mild acromioclavicular degenerative spurring.  06/17/23 CXR IMPRESSION: Unchanged left lung base opacity and small suspected pleural effusions.  06/18/23 CXR IMPRESSION: 1. Stable trace left pleural effusion or pleural thickening.  Results/Tests Pending at Time of Discharge: None  Discharge Medications:  Allergies as of 06/20/2023       Reactions   Codeine Swelling   Levofloxacin    Other reaction(s): Malaise (intolerance)   Penicillins Swelling   Xarelto [rivaroxaban]    Causes bleeding in stool and urine   Tape Rash        Medication List     PAUSE taking these medications    spironolactone 25 MG tablet Wait to take this until your doctor or other care provider tells you to start again. Commonly known as: ALDACTONE Take 1/2 (one-half) tablet by mouth once daily       STOP taking these medications    cefdinir 300 MG capsule Commonly known as: OMNICEF   clonazePAM 1 MG tablet Commonly known as: KLONOPIN   glimepiride 2 MG tablet Commonly known as: AMARYL   predniSONE 20 MG tablet Commonly known as: DELTASONE       TAKE these medications    acetaminophen 500 MG tablet Commonly known as: TYLENOL Take 500 mg by mouth as needed for mild pain (pain score 1-3) or headache.   albuterol 108 (90 Base) MCG/ACT inhaler Commonly  known as: VENTOLIN HFA Inhale 2 puffs into the lungs every 6 (six) hours as needed for wheezing or shortness of breath.   amiodarone 200 MG tablet Commonly known as: PACERONE Take 1 tablet (200 mg total) by mouth daily.   apixaban 2.5 MG Tabs tablet Commonly known as: ELIQUIS Take 1 tablet (2.5 mg total) by mouth 2 (two) times daily. What changed:  medication strength how much to take   budesonide-formoterol 160-4.5 MCG/ACT inhaler Commonly known as: SYMBICORT Inhale 2 puffs into the lungs 2 (two) times daily.    carboxymethylcellulose 0.5 % Soln Commonly known as: REFRESH PLUS Place 1 drop into both eyes daily as needed (dry eyes).   citalopram 20 MG tablet Commonly known as: CELEXA Take 20 mg by mouth daily.   CRANBERRY PO Take 1 tablet by mouth daily.   CULTRELLE KIDS IMMUNE DEFENSE PO Take 1 Dose by mouth daily.   diclofenac Sodium 1 % Gel Commonly known as: VOLTAREN APPLY 2 GRAMS TOPICALLY TO AFFECTED AREA THREE TIMES DAILY   estradiol 0.1 MG/GM vaginal cream Commonly known as: ESTRACE Apply 3 times weekly using a pea-sized amount on fingertip as directed   ferrous sulfate 325 (65 FE) MG EC tablet Take 325 mg by mouth daily with breakfast.   furosemide 20 MG tablet Commonly known as: LASIX Take 3 tablets (60 mg total) by mouth daily. What changed:  medication strength how much to take Another medication with the same name was removed. Continue taking this medication, and follow the directions you see here.   gabapentin 100 MG capsule Commonly known as: NEURONTIN Take 1 capsule (100 mg total) by mouth 3 (three) times daily. What changed: when to take this   insulin glargine 100 UNIT/ML injection Commonly known as: LANTUS Inject 0.1 mLs (10 Units total) into the skin daily.   lidocaine 5 % Commonly known as: LIDODERM Place 1 patch onto the skin daily. Remove & Discard patch within 12 hours or as directed by MD   loratadine 10 MG tablet Commonly known as: CLARITIN Take 10 mg by mouth daily.   metFORMIN 1000 MG tablet Commonly known as: GLUCOPHAGE Take 1 tablet (1,000 mg total) by mouth daily with breakfast. What changed:  medication strength how much to take when to take this additional instructions   multivitamin capsule Take 1 capsule by mouth daily.   nitroGLYCERIN 0.4 MG SL tablet Commonly known as: Nitrostat Place 1 tablet (0.4 mg total) under the tongue every 5 (five) minutes as needed for chest pain.   nystatin powder Commonly known as:  MYCOSTATIN/NYSTOP Apply topically 2 (two) times daily. Under right breast for intertrigo What changed:  how much to take when to take this reasons to take this additional instructions   ondansetron 4 MG disintegrating tablet Commonly known as: ZOFRAN-ODT Take 1 tablet (4 mg total) by mouth every 8 (eight) hours as needed for nausea or vomiting.   pantoprazole 40 MG tablet Commonly known as: PROTONIX Take 40 mg by mouth 2 (two) times daily.   polyethylene glycol 17 g packet Commonly known as: MIRALAX / GLYCOLAX Take 17 g by mouth daily.   potassium chloride 10 MEQ CR capsule Commonly known as: MICRO-K Take 10 mEq by mouth daily.   rOPINIRole 0.25 MG tablet Commonly known as: REQUIP Take 0.25 mg by mouth at bedtime.   rosuvastatin 40 MG tablet Commonly known as: CRESTOR Take 40 mg by mouth daily.   senna 8.6 MG Tabs tablet Commonly known as: SENOKOT Take 2  tablets by mouth at bedtime.   sitaGLIPtin 100 MG tablet Commonly known as: JANUVIA Take 100 mg by mouth daily.   sulfamethoxazole-trimethoprim 400-80 MG tablet Commonly known as: BACTRIM Take 1 tablet by mouth at bedtime.   Vitamin D (Ergocalciferol) 1.25 MG (50000 UNIT) Caps capsule Commonly known as: DRISDOL Take 50,000 Units by mouth every Sunday.        Discharge Instructions: Please refer to Patient Instructions section of EMR for full details.  Patient was counseled important signs and symptoms that should prompt return to medical care, changes in medications, dietary instructions, activity restrictions, and follow up appointments.   Follow-Up Appointments:   Jerre Simon, MD 06/20/2023, 10:49 AM PGY-3, [facility] Family Medicine

## 2023-06-22 ENCOUNTER — Ambulatory Visit: Payer: PPO | Admitting: Cardiology

## 2023-07-07 ENCOUNTER — Telehealth: Payer: Self-pay

## 2023-07-07 NOTE — Telephone Encounter (Signed)
 Danielle [name] is aware that she should bring in a urine sample prior to starting any additional treatment. She will try to get a some sterile cups from her pcp office as they are closer to her home. She will bring in a sample as soon as possible to assess the need for treatment.

## 2023-07-07 NOTE — Telephone Encounter (Signed)
 Pt fell and went to the hospital and then to rehab where she got pneumonia and a UTI. She was treated with macrobid. This made her sick and it was reported that she completed the full course of the antibiotics. She is now home and is complaining of pressure in her lower abdomen. [name] wants to know if they should start the Bactrim back?   Please advise.

## 2023-07-12 ENCOUNTER — Emergency Department (HOSPITAL_COMMUNITY)

## 2023-07-12 ENCOUNTER — Other Ambulatory Visit: Payer: Self-pay

## 2023-07-12 ENCOUNTER — Inpatient Hospital Stay (HOSPITAL_COMMUNITY)
Admission: EM | Admit: 2023-07-12 | Discharge: 2023-07-16 | DRG: 689 | Disposition: A | Discharge status: Hospice | Attending: Internal Medicine | Admitting: Internal Medicine

## 2023-07-12 ENCOUNTER — Encounter (HOSPITAL_COMMUNITY): Payer: Self-pay | Admitting: Emergency Medicine

## 2023-07-12 DIAGNOSIS — H6123 Impacted cerumen, bilateral: Secondary | ICD-10-CM | POA: Diagnosis present

## 2023-07-12 DIAGNOSIS — Z515 Encounter for palliative care: Secondary | ICD-10-CM

## 2023-07-12 DIAGNOSIS — Z6841 Body Mass Index (BMI) 40.0 and over, adult: Secondary | ICD-10-CM

## 2023-07-12 DIAGNOSIS — R531 Weakness: Secondary | ICD-10-CM

## 2023-07-12 DIAGNOSIS — I130 Hypertensive heart and chronic kidney disease with heart failure and stage 1 through stage 4 chronic kidney disease, or unspecified chronic kidney disease: Secondary | ICD-10-CM | POA: Diagnosis present

## 2023-07-12 DIAGNOSIS — N3001 Acute cystitis with hematuria: Secondary | ICD-10-CM | POA: Diagnosis not present

## 2023-07-12 DIAGNOSIS — E66813 Obesity, class 3: Secondary | ICD-10-CM | POA: Diagnosis present

## 2023-07-12 DIAGNOSIS — K648 Other hemorrhoids: Secondary | ICD-10-CM | POA: Diagnosis present

## 2023-07-12 DIAGNOSIS — J189 Pneumonia, unspecified organism: Secondary | ICD-10-CM | POA: Diagnosis present

## 2023-07-12 DIAGNOSIS — N029 Recurrent and persistent hematuria with unspecified morphologic changes: Secondary | ICD-10-CM | POA: Diagnosis present

## 2023-07-12 DIAGNOSIS — J9611 Chronic respiratory failure with hypoxia: Secondary | ICD-10-CM | POA: Diagnosis present

## 2023-07-12 DIAGNOSIS — Z794 Long term (current) use of insulin: Secondary | ICD-10-CM

## 2023-07-12 DIAGNOSIS — K219 Gastro-esophageal reflux disease without esophagitis: Secondary | ICD-10-CM | POA: Diagnosis present

## 2023-07-12 DIAGNOSIS — K922 Gastrointestinal hemorrhage, unspecified: Principal | ICD-10-CM

## 2023-07-12 DIAGNOSIS — Z8249 Family history of ischemic heart disease and other diseases of the circulatory system: Secondary | ICD-10-CM

## 2023-07-12 DIAGNOSIS — E782 Mixed hyperlipidemia: Secondary | ICD-10-CM | POA: Diagnosis present

## 2023-07-12 DIAGNOSIS — I4891 Unspecified atrial fibrillation: Secondary | ICD-10-CM | POA: Diagnosis present

## 2023-07-12 DIAGNOSIS — I2510 Atherosclerotic heart disease of native coronary artery without angina pectoris: Secondary | ICD-10-CM | POA: Diagnosis present

## 2023-07-12 DIAGNOSIS — D631 Anemia in chronic kidney disease: Secondary | ICD-10-CM | POA: Diagnosis present

## 2023-07-12 DIAGNOSIS — E1165 Type 2 diabetes mellitus with hyperglycemia: Secondary | ICD-10-CM | POA: Diagnosis present

## 2023-07-12 DIAGNOSIS — E1140 Type 2 diabetes mellitus with diabetic neuropathy, unspecified: Secondary | ICD-10-CM | POA: Diagnosis present

## 2023-07-12 DIAGNOSIS — Z96653 Presence of artificial knee joint, bilateral: Secondary | ICD-10-CM | POA: Diagnosis present

## 2023-07-12 DIAGNOSIS — Z880 Allergy status to penicillin: Secondary | ICD-10-CM

## 2023-07-12 DIAGNOSIS — Z7951 Long term (current) use of inhaled steroids: Secondary | ICD-10-CM

## 2023-07-12 DIAGNOSIS — J431 Panlobular emphysema: Secondary | ICD-10-CM | POA: Diagnosis present

## 2023-07-12 DIAGNOSIS — Z602 Problems related to living alone: Secondary | ICD-10-CM | POA: Diagnosis present

## 2023-07-12 DIAGNOSIS — R410 Disorientation, unspecified: Secondary | ICD-10-CM | POA: Diagnosis present

## 2023-07-12 DIAGNOSIS — R627 Adult failure to thrive: Secondary | ICD-10-CM | POA: Diagnosis present

## 2023-07-12 DIAGNOSIS — Z9981 Dependence on supplemental oxygen: Secondary | ICD-10-CM

## 2023-07-12 DIAGNOSIS — K140 Glossitis: Secondary | ICD-10-CM | POA: Diagnosis present

## 2023-07-12 DIAGNOSIS — G2581 Restless legs syndrome: Secondary | ICD-10-CM | POA: Diagnosis present

## 2023-07-12 DIAGNOSIS — I480 Paroxysmal atrial fibrillation: Secondary | ICD-10-CM | POA: Diagnosis present

## 2023-07-12 DIAGNOSIS — I5032 Chronic diastolic (congestive) heart failure: Secondary | ICD-10-CM | POA: Diagnosis present

## 2023-07-12 DIAGNOSIS — G4733 Obstructive sleep apnea (adult) (pediatric): Secondary | ICD-10-CM | POA: Diagnosis present

## 2023-07-12 DIAGNOSIS — Z79899 Other long term (current) drug therapy: Secondary | ICD-10-CM

## 2023-07-12 DIAGNOSIS — Z7901 Long term (current) use of anticoagulants: Secondary | ICD-10-CM

## 2023-07-12 DIAGNOSIS — E1122 Type 2 diabetes mellitus with diabetic chronic kidney disease: Secondary | ICD-10-CM | POA: Diagnosis present

## 2023-07-12 DIAGNOSIS — Z87891 Personal history of nicotine dependence: Secondary | ICD-10-CM

## 2023-07-12 DIAGNOSIS — Z7984 Long term (current) use of oral hypoglycemic drugs: Secondary | ICD-10-CM

## 2023-07-12 DIAGNOSIS — F39 Unspecified mood [affective] disorder: Secondary | ICD-10-CM | POA: Diagnosis present

## 2023-07-12 DIAGNOSIS — K5730 Diverticulosis of large intestine without perforation or abscess without bleeding: Secondary | ICD-10-CM | POA: Diagnosis present

## 2023-07-12 DIAGNOSIS — R0682 Tachypnea, not elsewhere classified: Secondary | ICD-10-CM | POA: Diagnosis not present

## 2023-07-12 DIAGNOSIS — E1169 Type 2 diabetes mellitus with other specified complication: Secondary | ICD-10-CM | POA: Diagnosis present

## 2023-07-12 DIAGNOSIS — Z66 Do not resuscitate: Secondary | ICD-10-CM | POA: Diagnosis present

## 2023-07-12 DIAGNOSIS — D62 Acute posthemorrhagic anemia: Secondary | ICD-10-CM | POA: Diagnosis present

## 2023-07-12 DIAGNOSIS — N1831 Chronic kidney disease, stage 3a: Secondary | ICD-10-CM | POA: Diagnosis present

## 2023-07-12 DIAGNOSIS — J449 Chronic obstructive pulmonary disease, unspecified: Secondary | ICD-10-CM | POA: Diagnosis present

## 2023-07-12 DIAGNOSIS — Z90710 Acquired absence of both cervix and uterus: Secondary | ICD-10-CM

## 2023-07-12 DIAGNOSIS — J440 Chronic obstructive pulmonary disease with acute lower respiratory infection: Secondary | ICD-10-CM | POA: Diagnosis present

## 2023-07-12 LAB — CBC WITH DIFFERENTIAL/PLATELET
Abs Immature Granulocytes: 0.02 10*3/uL (ref 0.00–0.07)
Basophils Absolute: 0.1 10*3/uL (ref 0.0–0.1)
Basophils Relative: 1 %
Eosinophils Absolute: 0.2 10*3/uL (ref 0.0–0.5)
Eosinophils Relative: 3 %
HCT: 32.3 % — ABNORMAL LOW (ref 36.0–46.0)
Hemoglobin: 9.7 g/dL — ABNORMAL LOW (ref 12.0–15.0)
Immature Granulocytes: 0 %
Lymphocytes Relative: 24 %
Lymphs Abs: 1.2 10*3/uL (ref 0.7–4.0)
MCH: 29.4 pg (ref 26.0–34.0)
MCHC: 30 g/dL (ref 30.0–36.0)
MCV: 97.9 fL (ref 80.0–100.0)
Monocytes Absolute: 0.6 10*3/uL (ref 0.1–1.0)
Monocytes Relative: 11 %
Neutro Abs: 3.2 10*3/uL (ref 1.7–7.7)
Neutrophils Relative %: 61 %
Platelets: 256 10*3/uL (ref 150–400)
RBC: 3.3 MIL/uL — ABNORMAL LOW (ref 3.87–5.11)
RDW: 15.1 % (ref 11.5–15.5)
WBC: 5.3 10*3/uL (ref 4.0–10.5)
nRBC: 0 % (ref 0.0–0.2)

## 2023-07-12 LAB — URINALYSIS, W/ REFLEX TO CULTURE (INFECTION SUSPECTED)
Bacteria, UA: NONE SEEN
Bilirubin Urine: NEGATIVE
Glucose, UA: 50 mg/dL — AB
Ketones, ur: NEGATIVE mg/dL
Nitrite: NEGATIVE
Protein, ur: 100 mg/dL — AB
Specific Gravity, Urine: 1.018 (ref 1.005–1.030)
WBC, UA: >50 WBC/hpf (ref 0–5)
pH: 6 (ref 5.0–8.0)

## 2023-07-12 LAB — RESP PANEL BY RT-PCR (RSV, FLU A&B, COVID)  RVPGX2
Influenza A by PCR: NEGATIVE
Influenza B by PCR: NEGATIVE
Resp Syncytial Virus by PCR: NEGATIVE
SARS Coronavirus 2 by RT PCR: NEGATIVE

## 2023-07-12 LAB — COMPREHENSIVE METABOLIC PANEL WITH GFR
ALT: 18 U/L (ref 0–44)
AST: 27 U/L (ref 15–41)
Albumin: 3.2 g/dL — ABNORMAL LOW (ref 3.5–5.0)
Alkaline Phosphatase: 61 U/L (ref 38–126)
Anion gap: 7 (ref 5–15)
BUN: 19 mg/dL (ref 8–23)
CO2: 26 mmol/L (ref 22–32)
Calcium: 8.5 mg/dL — ABNORMAL LOW (ref 8.9–10.3)
Chloride: 103 mmol/L (ref 98–111)
Creatinine, Ser: 0.74 mg/dL (ref 0.44–1.00)
GFR, Estimated: >60 mL/min (ref >60)
Glucose, Bld: 175 mg/dL — ABNORMAL HIGH (ref 70–99)
Potassium: 3.8 mmol/L (ref 3.5–5.1)
Sodium: 136 mmol/L (ref 135–145)
Total Bilirubin: 0.3 mg/dL (ref 0.0–1.2)
Total Protein: 6.6 g/dL (ref 6.5–8.1)

## 2023-07-12 LAB — EXPECTORATED SPUTUM ASSESSMENT W GRAM STAIN, RFLX TO RESP C

## 2023-07-12 LAB — POC OCCULT BLOOD, ED: Fecal Occult Bld: POSITIVE — AB

## 2023-07-12 LAB — BRAIN NATRIURETIC PEPTIDE: B Natriuretic Peptide: 149.7 pg/mL — ABNORMAL HIGH (ref 0.0–100.0)

## 2023-07-12 MED ORDER — SODIUM CHLORIDE 0.9% FLUSH
3.0000 mL | Freq: Two times a day (BID) | INTRAVENOUS | Status: DC
Start: 2023-07-12 — End: 2023-07-16
  Administered 2023-07-12 – 2023-07-14 (×4): 3 mL via INTRAVENOUS

## 2023-07-12 MED ORDER — ALBUTEROL SULFATE HFA 108 (90 BASE) MCG/ACT IN AERS: 2.0000 | INHALATION_SPRAY | RESPIRATORY_TRACT | Status: DC | PRN

## 2023-07-12 MED ORDER — SODIUM CHLORIDE 0.9 % IV SOLN
500.0000 mg | Freq: Once | INTRAVENOUS | Status: AC
  Administered 2023-07-12: 500 mg via INTRAVENOUS
  Filled 2023-07-12: qty 5

## 2023-07-12 MED ORDER — ALBUTEROL SULFATE (2.5 MG/3ML) 0.083% IN NEBU
2.5000 mg | INHALATION_SOLUTION | RESPIRATORY_TRACT | Status: DC | PRN
  Administered 2023-07-13 (×3): 2.5 mg via RESPIRATORY_TRACT
  Filled 2023-07-12 (×3): qty 3

## 2023-07-12 MED ORDER — FERROUS SULFATE 325 (65 FE) MG PO TABS
325.0000 mg | ORAL_TABLET | Freq: Every day | ORAL | Status: DC
  Administered 2023-07-13 – 2023-07-16 (×4): 325 mg via ORAL
  Filled 2023-07-12 (×5): qty 1

## 2023-07-12 MED ORDER — LACTINEX PO CHEW
1.0000 | CHEWABLE_TABLET | Freq: Every day | ORAL | Status: DC
  Filled 2023-07-12: qty 1

## 2023-07-12 MED ORDER — AMIODARONE HCL 200 MG PO TABS
200.0000 mg | ORAL_TABLET | Freq: Every day | ORAL | Status: DC
  Administered 2023-07-12 – 2023-07-16 (×5): 200 mg via ORAL
  Filled 2023-07-12 (×5): qty 1

## 2023-07-12 MED ORDER — GUAIFENESIN 100 MG/5ML PO LIQD
5.0000 mL | Freq: Once | ORAL | Status: DC
  Filled 2023-07-12: qty 10

## 2023-07-12 MED ORDER — GABAPENTIN 100 MG PO CAPS
100.0000 mg | ORAL_CAPSULE | Freq: Three times a day (TID) | ORAL | Status: DC
  Administered 2023-07-12 – 2023-07-13 (×3): 100 mg via ORAL
  Filled 2023-07-12 (×3): qty 1

## 2023-07-12 MED ORDER — DICLOFENAC SODIUM 1 % EX GEL
2.0000 g | Freq: Four times a day (QID) | CUTANEOUS | Status: DC
  Filled 2023-07-12: qty 100

## 2023-07-12 MED ORDER — IPRATROPIUM-ALBUTEROL 0.5-2.5 (3) MG/3ML IN SOLN
3.0000 mL | Freq: Once | RESPIRATORY_TRACT | Status: AC
  Administered 2023-07-12: 3 mL via RESPIRATORY_TRACT
  Filled 2023-07-12: qty 3

## 2023-07-12 MED ORDER — DOXYCYCLINE HYCLATE 100 MG PO TABS: 100.0000 mg | ORAL_TABLET | Freq: Two times a day (BID) | ORAL | Status: DC

## 2023-07-12 MED ORDER — FLORANEX PO PACK
1.0000 g | PACK | Freq: Every day | ORAL | Status: DC
  Administered 2023-07-13 – 2023-07-16 (×4): 1 g via ORAL
  Filled 2023-07-12 (×4): qty 1

## 2023-07-12 MED ORDER — INSULIN GLARGINE-YFGN 100 UNIT/ML ~~LOC~~ SOLN
10.0000 [IU] | Freq: Every day | SUBCUTANEOUS | Status: DC
  Administered 2023-07-13 – 2023-07-16 (×4): 10 [IU] via SUBCUTANEOUS
  Filled 2023-07-12 (×4): qty 0.1

## 2023-07-12 MED ORDER — SODIUM CHLORIDE 0.9 % IV SOLN: 500.0000 mg | INTRAVENOUS | Status: DC

## 2023-07-12 MED ORDER — POLYVINYL ALCOHOL 1.4 % OP SOLN: 1.0000 [drp] | Freq: Every day | OPHTHALMIC | Status: DC | PRN

## 2023-07-12 MED ORDER — SODIUM CHLORIDE 0.9 % IV SOLN
2.0000 g | Freq: Every day | INTRAVENOUS | Status: DC
  Administered 2023-07-13 – 2023-07-14 (×2): 2 g via INTRAVENOUS
  Filled 2023-07-12 (×3): qty 20

## 2023-07-12 MED ORDER — NYSTATIN 100000 UNIT/GM EX POWD
Freq: Two times a day (BID) | CUTANEOUS | Status: DC
  Filled 2023-07-12: qty 15

## 2023-07-12 MED ORDER — ACETAMINOPHEN 325 MG PO TABS
650.0000 mg | ORAL_TABLET | Freq: Four times a day (QID) | ORAL | Status: DC | PRN
  Administered 2023-07-13 – 2023-07-16 (×5): 650 mg via ORAL
  Filled 2023-07-12 (×6): qty 2

## 2023-07-12 MED ORDER — ROSUVASTATIN CALCIUM 20 MG PO TABS
40.0000 mg | ORAL_TABLET | Freq: Every day | ORAL | Status: DC
  Administered 2023-07-12 – 2023-07-16 (×5): 40 mg via ORAL
  Filled 2023-07-12 (×5): qty 2

## 2023-07-12 MED ORDER — ROPINIROLE HCL 0.25 MG PO TABS
0.2500 mg | ORAL_TABLET | Freq: Every day | ORAL | Status: DC
  Administered 2023-07-12 – 2023-07-15 (×4): 0.25 mg via ORAL
  Filled 2023-07-12 (×5): qty 1

## 2023-07-12 MED ORDER — FLUTICASONE FUROATE-VILANTEROL 200-25 MCG/ACT IN AEPB: 1.0000 | INHALATION_SPRAY | Freq: Every day | RESPIRATORY_TRACT | Status: DC

## 2023-07-12 MED ORDER — ACETAMINOPHEN 650 MG RE SUPP: 650.0000 mg | Freq: Four times a day (QID) | RECTAL | Status: DC | PRN

## 2023-07-12 MED ORDER — HYDROCODONE-ACETAMINOPHEN 5-325 MG PO TABS
1.0000 | ORAL_TABLET | ORAL | Status: DC | PRN
  Administered 2023-07-12 (×2): 2 via ORAL
  Administered 2023-07-13: 1 via ORAL
  Filled 2023-07-12 (×3): qty 2

## 2023-07-12 MED ORDER — BISACODYL 5 MG PO TBEC: 5.0000 mg | DELAYED_RELEASE_TABLET | Freq: Every day | ORAL | Status: DC | PRN

## 2023-07-12 MED ORDER — DICLOFENAC SODIUM 1 % EX GEL
2.0000 g | Freq: Four times a day (QID) | CUTANEOUS | Status: DC
  Administered 2023-07-12 – 2023-07-16 (×14): 2 g via TOPICAL
  Filled 2023-07-12: qty 100

## 2023-07-12 MED ORDER — PANTOPRAZOLE SODIUM 40 MG PO TBEC
40.0000 mg | DELAYED_RELEASE_TABLET | Freq: Two times a day (BID) | ORAL | Status: DC
  Administered 2023-07-12 – 2023-07-16 (×8): 40 mg via ORAL
  Filled 2023-07-12 (×8): qty 1

## 2023-07-12 MED ORDER — BENZOCAINE 10 % MT GEL: Freq: Two times a day (BID) | OROMUCOSAL | Status: DC | PRN

## 2023-07-12 MED ORDER — SODIUM CHLORIDE 0.9 % IV SOLN
1.0000 g | Freq: Once | INTRAVENOUS | Status: AC
  Administered 2023-07-12: 1 g via INTRAVENOUS
  Filled 2023-07-12: qty 10

## 2023-07-12 MED ORDER — GUAIFENESIN ER 600 MG PO TB12
600.0000 mg | ORAL_TABLET | Freq: Two times a day (BID) | ORAL | Status: DC | PRN
  Administered 2023-07-14 (×2): 600 mg via ORAL
  Filled 2023-07-12 (×2): qty 1

## 2023-07-12 MED ORDER — MELATONIN 5 MG PO TABS
5.0000 mg | ORAL_TABLET | Freq: Every evening | ORAL | Status: AC | PRN
  Administered 2023-07-13 – 2023-07-14 (×3): 5 mg via ORAL
  Filled 2023-07-12 (×3): qty 1

## 2023-07-12 MED ORDER — ACETAMINOPHEN 325 MG PO TABS
650.0000 mg | ORAL_TABLET | Freq: Once | ORAL | Status: AC
  Administered 2023-07-12: 650 mg via ORAL
  Filled 2023-07-12: qty 2

## 2023-07-12 MED ORDER — ONDANSETRON HCL 4 MG PO TABS
4.0000 mg | ORAL_TABLET | Freq: Four times a day (QID) | ORAL | Status: DC | PRN
  Administered 2023-07-15: 4 mg via ORAL
  Filled 2023-07-12: qty 1

## 2023-07-12 MED ORDER — SENNA 8.6 MG PO TABS
2.0000 | ORAL_TABLET | Freq: Every day | ORAL | Status: DC
  Administered 2023-07-12 – 2023-07-14 (×3): 17.2 mg via ORAL
  Filled 2023-07-12 (×4): qty 2

## 2023-07-12 MED ORDER — POLYETHYLENE GLYCOL 3350 17 G PO PACK
17.0000 g | PACK | Freq: Every day | ORAL | Status: DC
  Administered 2023-07-14: 17 g via ORAL
  Filled 2023-07-12 (×4): qty 1

## 2023-07-12 MED ORDER — HYDRALAZINE HCL 20 MG/ML IJ SOLN: 5.0000 mg | INTRAMUSCULAR | Status: DC | PRN

## 2023-07-12 MED ORDER — FLUTICASONE FUROATE-VILANTEROL 200-25 MCG/ACT IN AEPB
1.0000 | INHALATION_SPRAY | Freq: Every day | RESPIRATORY_TRACT | Status: DC
  Administered 2023-07-13 – 2023-07-16 (×4): 1 via RESPIRATORY_TRACT
  Filled 2023-07-12: qty 28

## 2023-07-12 MED ORDER — ONDANSETRON HCL 4 MG/2ML IJ SOLN
4.0000 mg | Freq: Four times a day (QID) | INTRAMUSCULAR | Status: DC | PRN
  Administered 2023-07-13: 4 mg via INTRAVENOUS
  Filled 2023-07-12: qty 2

## 2023-07-12 MED ORDER — CITALOPRAM HYDROBROMIDE 20 MG PO TABS
20.0000 mg | ORAL_TABLET | Freq: Every day | ORAL | Status: DC
  Administered 2023-07-12 – 2023-07-16 (×5): 20 mg via ORAL
  Filled 2023-07-12 (×5): qty 1

## 2023-07-12 MED ORDER — SODIUM CHLORIDE 0.9 % IV SOLN
100.0000 mg | Freq: Two times a day (BID) | INTRAVENOUS | Status: DC
  Administered 2023-07-13 – 2023-07-14 (×3): 100 mg via INTRAVENOUS
  Filled 2023-07-12 (×5): qty 100

## 2023-07-12 MED ORDER — LORATADINE 10 MG PO TABS
10.0000 mg | ORAL_TABLET | Freq: Every day | ORAL | Status: DC
  Administered 2023-07-12 – 2023-07-16 (×5): 10 mg via ORAL
  Filled 2023-07-12 (×5): qty 1

## 2023-07-12 MED ORDER — HYDROGEN PEROXIDE 3 % EX SOLN
CUTANEOUS | Status: AC
  Filled 2023-07-12: qty 473

## 2023-07-12 MED ORDER — IOHEXOL 300 MG/ML  SOLN
100.0000 mL | Freq: Once | INTRAMUSCULAR | Status: AC | PRN
  Administered 2023-07-12: 100 mL via INTRAVENOUS

## 2023-07-12 NOTE — ED Triage Notes (Signed)
 Pt previously diagnosed with pneumonia and UTI. EMS states daughter said mother is not compliant with antibiotics. Pt just discharged from rehab Monday. Pt c/o weakness, blood in urine, green sputum, headache, leg pain. Pt also complaints of bump on top of tongue. Denies N/V/D. Pt on Eliquis.

## 2023-07-12 NOTE — ED Provider Notes (Signed)
 [place] [facility] AT [facility] Provider Note   CSN: [id number] Arrival date & time: 07/12/23  7829     History  Chief Complaint  Patient presents with   Fatigue    Danielle [name] is a 89 y.o. female with a history of CHF, COPD, atrial fibrillation, and type 2 diabetes mellitus who presents the ED today for generalized weakness.  Patient's family is at bedside and states that she got discharged from a rehab facility last week after being admitted to the hospital after having a fall and having the flu with hypoxemia.  Patient's daughter states that patient was refusing the Macrobid antibiotic given for UTI while in the rehab facility.  Additionally, patient complains of right ear pain, generalized weakness, pain in the bilateral calves, cough, and a sore on her tongue.  Patient's primary care started her on nystatin.  She was instructed to swish the medication in her mouth and swallow but she has not been swallowing it second to diarrhea.  She is unsure whether or not the symptoms have improved.  Additionally patient reports blood in her urine as well as episode of blood in her stools yesterday.  She does take Eliquis daily for atrial fibrillation.  HPI     Home Medications Prior to Admission medications   Medication Sig Start Date End Date Taking? Authorizing Provider  acetaminophen (TYLENOL) 500 MG tablet Take 500 mg by mouth as needed for mild pain (pain score 1-3) or headache.    [provider]  albuterol (PROVENTIL HFA;VENTOLIN HFA) 108 (90 BASE) MCG/ACT inhaler Inhale 2 puffs into the lungs every 6 (six) hours as needed for wheezing or shortness of breath.     [provider]  amiodarone (PACERONE) 200 MG tablet Take 1 tablet (200 mg total) by mouth daily. 03/05/23   Krasowski, Robert J, MD  apixaban (ELIQUIS) 2.5 MG TABS tablet Take 1 tablet (2.5 mg total) by mouth 2 (two) times daily. 06/19/23   Lavada Porteous, DO   budesonide-formoterol [facility]) 160-4.5 MCG/ACT inhaler Inhale 2 puffs into the lungs 2 (two) times daily.    [provider]  carboxymethylcellulose (REFRESH PLUS) 0.5 % SOLN Place 1 drop into both eyes daily as needed (dry eyes).    [provider]  citalopram (CELEXA) 20 MG tablet Take 20 mg by mouth daily. 10/12/16   [provider]  CRANBERRY PO Take 1 tablet by mouth daily.    [provider]  diclofenac Sodium (VOLTAREN) 1 % GEL APPLY 2 GRAMS TOPICALLY TO AFFECTED AREA THREE TIMES DAILY 08/08/19   Timothy Ford, MD  estradiol (ESTRACE) 0.1 MG/GM vaginal cream Apply 3 times weekly using a pea-sized amount on fingertip as directed 05/13/23   Scarlet Curly, MD  ferrous sulfate 325 (65 FE) MG EC tablet Take 325 mg by mouth daily with breakfast.    [provider]  furosemide (LASIX) 20 MG tablet Take 3 tablets (60 mg total) by mouth daily. 06/20/23   Lavada Porteous, DO  gabapentin (NEURONTIN) 100 MG capsule Take 1 capsule (100 mg total) by mouth 3 (three) times daily. 06/19/23   Lavada Porteous, DO  insulin glargine (LANTUS) 100 UNIT/ML injection Inject 0.1 mLs (10 Units total) into the skin daily. 06/20/23   Lavada Porteous, DO  lidocaine (LIDODERM) 5 % Place 1 patch onto the skin daily. Remove & Discard patch within 12 hours or as directed by MD 06/19/23   Lavada Porteous, DO  loratadine (CLARITIN) 10 MG  tablet Take 10 mg by mouth daily.    [provider]  metFORMIN (GLUCOPHAGE) 1000 MG tablet Take 1 tablet (1,000 mg total) by mouth daily with breakfast. 06/20/23   Lavada Porteous, DO  Multiple Vitamin (MULTIVITAMIN) capsule Take 1 capsule by mouth daily.    [provider]  nitroGLYCERIN (NITROSTAT) 0.4 MG SL tablet Place 1 tablet (0.4 mg total) under the tongue every 5 (five) minutes as needed for chest pain. 08/22/20 08/21/27  Krasowski, Robert J, MD  nystatin (MYCOSTATIN) 100000 UNIT/ML suspension Take 5 mLs by mouth 4 (four)  times daily. 07/09/23 07/19/23  [provider]  nystatin (MYCOSTATIN/NYSTOP) powder Apply topically 2 (two) times daily. Under right breast for intertrigo 06/19/23   Lavada Porteous, DO  ondansetron (ZOFRAN-ODT) 4 MG disintegrating tablet Take 1 tablet (4 mg total) by mouth every 8 (eight) hours as needed for nausea or vomiting. Patient not taking: Reported on 06/14/2023 06/12/23   Landa Pine, FNP  [facility] ULTRA test strip 1 each by Other route daily. 06/16/23   [provider]  pantoprazole (PROTONIX) 40 MG tablet Take 40 mg by mouth 2 (two) times daily. 11/14/16   [provider]  polyethylene glycol (MIRALAX / GLYCOLAX) 17 g packet Take 17 g by mouth daily. 06/20/23   Lavada Porteous, DO  potassium chloride (MICRO-K) 10 MEQ CR capsule Take 10 mEq by mouth daily. 11/20/14   [provider]  Probiotic Product (CULTRELLE KIDS IMMUNE DEFENSE PO) Take 1 Dose by mouth daily.    [provider]  rOPINIRole (REQUIP) 0.25 MG tablet Take 0.25 mg by mouth at bedtime.    [provider]  rosuvastatin (CRESTOR) 40 MG tablet Take 40 mg by mouth daily.    [provider]  senna (SENOKOT) 8.6 MG TABS tablet Take 2 tablets by mouth at bedtime.    [provider]  sitaGLIPtin (JANUVIA) 100 MG tablet Take 100 mg by mouth daily. 03/11/22   [provider]  spironolactone (ALDACTONE) 25 MG tablet Take 1/2 (one-half) tablet by mouth once daily 03/20/23   Krasowski, Robert J, MD  sulfamethoxazole-trimethoprim (BACTRIM DS) 800-160 MG tablet Take 1 tablet by mouth 2 (two) times daily. 07/10/23 07/15/23  [provider]  sulfamethoxazole-trimethoprim (BACTRIM) 400-80 MG tablet Take 1 tablet by mouth at bedtime. 05/13/23   Scarlet Curly, MD  Vitamin D, Ergocalciferol, (DRISDOL) 1.25 MG (50000 UNIT) CAPS capsule Take 50,000 Units by mouth every Sunday. 02/10/23   [provider]      Allergies    Codeine, Levofloxacin,  Penicillins, Xarelto [rivaroxaban], and Tape    Review of Systems   Review of Systems  Neurological:  Positive for weakness.  All other systems reviewed and are negative.   Physical Exam Updated Vital Signs BP (!) 155/65 (BP Location: Left Arm)   Pulse 61   Temp 98.2 F (36.8 C) (Oral)   Resp (!) 24   Ht 5\' 3"  (1.6 m)   Wt 107.5 kg   SpO2 100%   BMI 41.96 kg/m  Physical Exam Vitals and nursing note reviewed.  Constitutional:      Appearance: Normal appearance.  HENT:     Head: Normocephalic and atraumatic.     Right Ear: There is impacted cerumen.     Left Ear: There is impacted cerumen.     Ears:     Comments: Extensive cerumen in bilateral ears    Mouth/Throat:     Mouth: Mucous membranes are moist.  Comments: White patches on tongue Eyes:     Conjunctiva/sclera: Conjunctivae normal.     Pupils: Pupils are equal, round, and reactive to light.  Cardiovascular:     Rate and Rhythm: Normal rate and regular rhythm.     Pulses: Normal pulses.     Heart sounds: Normal heart sounds.  Pulmonary:     Effort: Pulmonary effort is normal.     Breath sounds: Wheezing present.  Abdominal:     Palpations: Abdomen is soft.     Tenderness: There is no abdominal tenderness.  Musculoskeletal:        General: Tenderness present.     Cervical back: Normal range of motion.     Right lower leg: Edema present.     Left lower leg: Edema present.     Comments: Swelling to lower extremities bilaterally with tenderness to palpation  Skin:    General: Skin is warm and dry.     Findings: No rash.  Neurological:     General: No focal deficit present.     Mental Status: She is alert.     Sensory: No sensory deficit.     Motor: No weakness.  Psychiatric:        Mood and Affect: Mood normal.        Behavior: Behavior normal.    ED Results / Procedures / Treatments   Labs (all labs ordered are listed, but only abnormal results are displayed) Labs Reviewed  COMPREHENSIVE  METABOLIC PANEL WITH GFR - Abnormal; Notable for the following components:      Result Value   Glucose, Bld 175 (*)    Calcium 8.5 (*)    Albumin 3.2 (*)    All other components within normal limits  CBC WITH DIFFERENTIAL/PLATELET - Abnormal; Notable for the following components:   RBC 3.30 (*)    Hemoglobin 9.7 (*)    HCT 32.3 (*)    All other components within normal limits  BRAIN NATRIURETIC PEPTIDE - Abnormal; Notable for the following components:   B Natriuretic Peptide 149.7 (*)    All other components within normal limits  URINALYSIS, W/ REFLEX TO CULTURE (INFECTION SUSPECTED) - Abnormal; Notable for the following components:   APPearance CLOUDY (*)    Glucose, UA 50 (*)    Hgb urine dipstick SMALL (*)    Protein, ur 100 (*)    Leukocytes,Ua LARGE (*)    All other components within normal limits  POC OCCULT BLOOD, ED - Abnormal; Notable for the following components:   Fecal Occult Bld POSITIVE (*)    All other components within normal limits  RESP PANEL BY RT-PCR (RSV, FLU A&B, COVID)  [id number]  URINE CULTURE  EXPECTORATED SPUTUM ASSESSMENT W GRAM STAIN, RFLX TO RESP C    EKG EKG Interpretation Date/Time:  Sunday July 12 2023 09:55:51 EDT Ventricular Rate:  62 PR Interval:  46 QRS Duration:  118 QT Interval:  468 QTC Calculation: 476 R Axis:   -19  Text Interpretation: Sinus rhythm Short PR interval Nonspecific intraventricular conduction delay Artifact in lead(s) I II III aVR aVL Duplicate EKG Confirmed by Celesta Coke ([id number]) on 07/12/2023 10:26:23 AM  Radiology CT ABDOMEN PELVIS W CONTRAST Result Date: 07/12/2023 CLINICAL DATA:  Clinical concern for diverticulitis EXAM: CT ABDOMEN AND PELVIS WITH CONTRAST TECHNIQUE: Multidetector CT imaging of the abdomen and pelvis was performed using the standard protocol following bolus administration of intravenous contrast. RADIATION DOSE REDUCTION: This exam was performed according to the departmental dose-optimization  program which includes automated exposure control, adjustment of the mA and/or kV according to patient size and/or use of iterative reconstruction technique. CONTRAST:  OMNIPAQUE IOHEXOL 300 MG/ML  SOLN COMPARISON:  CT of the abdomen and pelvis performed February 13, 2023 FINDINGS: Lower chest: A small focus of airspace consolidation is present in the left lower lobe. Hepatobiliary: No focal liver abnormality is seen. Status post cholecystectomy. No biliary dilatation. Pancreas: Nothing significant. Spleen: Nothing significant. Adrenals/Urinary Tract: Calcifications are present in both kidneys. Small low-attenuation foci are present in both kidneys which are incompletely characterized. Urinary bladder is decompressed. Stomach/Bowel: No dilated loops of bowel are seen. Vascular/Lymphatic: Atherosclerotic changes are present in the abdominal aorta and major branch vessels. No abdominal aortic aneurysm. Reproductive: Uterus is surgically absent. Other: Nothing significant. Musculoskeletal: Degenerative changes are present in the imaged osseous structures. IMPRESSION: 1. Left lower lobe pneumonia. 2. No evidence of diverticulitis. Electronically Signed   By: Reagan Camera M.D.   On: 07/12/2023 13:35   DG Chest 2 View Result Date: 07/12/2023 CLINICAL DATA:  Shortness of breath and weakness EXAM: CHEST - 2 VIEW COMPARISON:  06/18/2023 FINDINGS: Cardiomegaly. Mild scarring at the left lung base is stable. There is no edema, consolidation, effusion, or pneumothorax. Extensive artifact from EKG leads. IMPRESSION: No evidence of active disease.  Chronic cardiomegaly. Electronically Signed   By: Ronnette Coke M.D.   On: 07/12/2023 11:13    Procedures Ear Cerumen Removal  Date/Time: 07/12/2023 3:32 PM  Performed by: Hercules Lombard, RN Authorized by: Sonnie Dusky, PA-C   Consent:    Risks discussed:  Pain and incomplete removal Universal protocol:    Patient identity confirmed:  Verbally with  patient Procedure details:    Location:  L ear and R ear   Procedure type: irrigation     Procedure outcomes: unable to remove cerumen   Post-procedure details:    Hearing quality:  Normal .Critical Care  Performed by: Sonnie Dusky, PA-C Authorized by: Sonnie Dusky, PA-C   Critical care provider statement:    Critical care time (minutes):  32   Critical care was time spent personally by me on the following activities:  Blood draw for specimens, development of treatment plan with patient or surrogate, discussions with consultants, evaluation of patient's response to treatment, review of old charts, re-evaluation of patient's condition, pulse oximetry, ordering and review of radiographic studies, ordering and review of laboratory studies and ordering and performing treatments and interventions   Care discussed with: admitting provider       Medications Ordered in ED Medications  guaiFENesin (ROBITUSSIN) 100 MG/5ML liquid 5 mL (0 mLs Oral Hold 07/12/23 1426)  albuterol (PROVENTIL) (2.5 MG/3ML) 0.083% nebulizer solution 2.5 mg (has no administration in time range)  ipratropium-albuterol (DUONEB) 0.5-2.5 (3) MG/3ML nebulizer solution 3 mL (3 mLs Nebulization Given 07/12/23 1110)  acetaminophen (TYLENOL) tablet 650 mg (650 mg Oral Given 07/12/23 1107)  iohexol (OMNIPAQUE) 300 MG/ML solution 100 mL (100 mLs Intravenous Contrast Given 07/12/23 1319)  hydrogen peroxide 3 % external solution (  Given 07/12/23 1437)  cefTRIAXone (ROCEPHIN) 1 g in sodium chloride 0.9 % 100 mL IVPB (0 g Intravenous Stopped 07/12/23 1519)  azithromycin (ZITHROMAX) 500 mg in sodium chloride 0.9 % 250 mL IVPB (500 mg Intravenous New Bag/Given 07/12/23 1501)    ED Course/ Medical Decision Making/ A&P  Medical Decision Making Amount and/or Complexity of Data Reviewed Labs: ordered. Radiology: ordered.  Risk OTC drugs. Prescription drug management. Decision regarding  hospitalization.   This patient presents to the ED for concern of generalized weakness, this involves an extensive number of treatment options, and is a complaint that carries with it a high risk of complications and morbidity.   Differential diagnosis includes: Flu, COVID, RSV, pneumonia, anemia, hypoglycemia, UTI, electrolyte derangement, AKI, cardiac arrhythmia, sepsis, etc.   Comorbidities  See HPI above   Additional History  Additional history obtained from prior ED/hospital records and family at  bedside.   Cardiac Monitoring / EKG  The patient was maintained on a cardiac monitor.  I personally viewed and interpreted the cardiac monitored which showed: sinus rhythm with a heart rate of 62 bpm.   Lab Tests  I ordered and personally interpreted labs.  The pertinent results include:   CMP and CBC are reassuring.  Hemoglobin of 9.7, increased since last hospitalization Negative respiratory panel Positive fecal occult blood test UA shows large leukocytes and small hemoglobin BNP of 149.7   Imaging Studies  I ordered imaging studies including CXR and CT abdomen/pelvis  I independently visualized and interpreted imaging which showed:  Chest x-ray shows no evidence of active disease.  Chronic cardiomegaly. CT shows left lower lobe pneumonia.  No evidence of diverticulitis. I agree with the radiologist interpretation   Consultations  I requested consultation with Amy Esterwood PA-C with [facility],  and discussed lab and imaging findings as well as pertinent plan - they recommend: they will consult if we admit patient to hospitalist service. I requested consultation with Dr. Murrel Arnt with [facility],  and discussed lab and imaging findings as well as pertinent plan - they recommend: admission for further evaluation and management of symptoms.   Problem List / ED Course / Critical Interventions / Medication Management  Patient reports generalized weakness for the past several  days.  She was recently discharged from the hospital and in a rehab facility.  She was being treated for UTI but would often decline taking Macrobid.  She was also being treated for thrush due to sores in the mouth.  Patient reports that she has been having blood in her urine had 1 episode of blood in her stool yesterday.  No abdominal pain.  No fevers.  Does endorse some episodes of diarrhea. I ordered medications including: DuoNeb for shortness of breath Azithromycin and Rocephin for pneumonia Reevaluation of the patient after these medicines showed that the patient improved. Ear lavage done in the ED without significant improvement of patient's bilateral ear pain. I have reviewed the patients home medicines and have made adjustments as needed   Social Determinants of Health  Housing   Test / Admission - Considered  Discussed findings with patient and family at bedside.  They are agreeable with plan for admission.       Final Clinical Impression(s) / ED Diagnoses Final diagnoses:  Acute lower GI bleeding  Acute cystitis with hematuria  Pneumonia of left lower lobe due to infectious organism    Rx / DC Orders ED Discharge Orders     None         Sonnie Dusky, PA-C 07/12/23 1631    Celesta Coke K, DO 07/16/23 1557

## 2023-07-12 NOTE — H&P (Signed)
 History and Physical    Patient: Danielle [name]:[id number] DOB: October 01, 1933 DOA: 07/12/2023 DOS: the patient was seen and examined on 07/12/2023 PCP: Abbe Hoard., MD  Patient coming from: Home - lives alone; NOK: Daughter, Adonica Ala, [phone]   Chief Complaint: weakness  HPI: Danielle [name] is a 89 y.o. female with medical history significant of chronic diastolic CHF, COPD, DM, HTN, HLD, CAD, OSA, and stage 3a CKD who presented for weakness, cough.  She was last hospitalized from 3/15-22 with acute hypoxic respiratory failure in the setting of influenza A.   She was discharged to SNF rehab and returned home on Monday, 4/6.  She did well after rehab and has been ambulating around her home on her home 2L [place] O2 without difficulty.  Cough has been worse than usual and productive of greenish sputum, which is different from baseline.  This weekend, she has been increasingly fatigued and SOB with ambulation, generally weak.  No fever.  She has had intermittent gross hematuria since October and has that currently.  She had a hard BM over the weekend and has had gross hematochezia since that she attributes to hemorrhoids.    ER Course:   LLL PNA, UTI, and rectal bleeding.  Presented with fatigue after recent hospitalization.  Given Macrobid prn at [facility].  Urine looks infected.  On Eliquis for afib, noticed BRBPR yesterday, Hgb 9.7 and improved.  Stable on home O2, started on Mucinex.  Spoke with GI, they will consult.     Review of Systems: As mentioned in the history of present illness. All other systems reviewed and are negative. Past Medical History:  Diagnosis Date   Anginal pain (HCC)    Arthritis    Asthma    At high risk for falls 11/11/2017   CHF (congestive heart failure) (HCC)    Chronic diastolic congestive heart failure (HCC) 11/11/2015   Chronic insomnia 10/24/2015   Chronic pain of both knees 05/14/2018   Chronic respiratory failure with hypoxia (HCC)  05/05/2017   CKD (chronic kidney disease) stage 3, GFR 30-59 ml/min (HCC) 05/07/2017   COPD (chronic obstructive pulmonary disease) (HCC)    Coronary artery disease    Coronary artery disease involving native coronary artery of native heart without angina pectoris 11/14/2014   Overview:  40% RCA a cardiac catheter in 2014   Current mild episode of major depressive disorder (HCC) 11/14/2014   Depression    DOE (dyspnea on exertion) 09/07/2018   Dysphagia 08/12/2018   Edema 08/12/2018   Elevated blood sugar 11/09/2015   Emphysema lung (HCC)    Flat feet, bilateral 05/30/2020   Gastroesophageal reflux disease without esophagitis 11/11/2017   GERD (gastroesophageal reflux disease)    Headache    History of hiatal hernia    History of kidney stones    Hyperlipidemia    Hypertension    Insomnia    Internal hemorrhoids 11/26/2018   Iron deficiency anemia 08/12/2018   Malaise and fatigue 08/14/2015   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/26/2018   Mesenteric mass 02/19/2015   Midsternal chest pain 11/11/2015   Mixed hyperlipidemia 11/14/2014   Morbid obesity (HCC) 09/07/2018   OSA (obstructive sleep apnea) 12/16/2019   Osteoporosis    Panlobular emphysema (HCC) 11/09/2015   Pneumonia 2013   PONV (postoperative nausea and vomiting)    Positive D-dimer 09/08/2018   Primary osteoarthritis involving multiple joints 08/14/2015   Restless legs syndrome 03/04/2018   Senile purpura (HCC) 08/12/2018   Shortness of breath dyspnea  with exertion   Stasis edema of left lower extremity 04/13/2018   Strain of right shoulder 08/12/2018   Type 2 diabetes mellitus with stage 3 chronic kidney disease (HCC) 08/14/2015   Unstable angina (HCC) 10/30/2018   Vitamin B12 deficiency 04/13/2018   Vitamin D deficiency 02/27/2016   Past Surgical History:  Procedure Laterality Date   ABDOMINAL HYSTERECTOMY     APPENDECTOMY     BREAST SURGERY  40 yrs. ago   growth removed in each breast-benign   CARDIAC  CATHETERIZATION     12/14/12 LHC (HPR): few mild stenotic areas max 40% of ectatic RCA o/w NL coronaries, EF 65%. Med tx.   CATARACT EXTRACTION W/ INTRAOCULAR LENS  IMPLANT, BILATERAL Bilateral 15 yrs ago   CHOLECYSTECTOMY     COLONOSCOPY  04/22/2013   Diverticulosis in the sigmoid colon. Non bleeding internal hemorrhoids. Normal mucosa vascular pattern in the entire examined colon. Three 6 mm polyps n the descending colon. Resected and retrieved.    CYSTOSCOPY WITH RETROGRADE PYELOGRAM, URETEROSCOPY AND STENT PLACEMENT Right 02/14/2023   Procedure: CYSTOSCOPY WITH RETROGRADE PYELOGRAM, URETEROSCOPY AND STENT PLACEMENT;  Surgeon: Scarlet Curly, MD;  Location: [facility] OR;  Service: Urology;  Laterality: Right;   CYSTOSCOPY WITH RETROGRADE PYELOGRAM, URETEROSCOPY AND STENT PLACEMENT Right 03/17/2023   Procedure: CYSTOSCOPY WITH RETROGRADE PYELOGRAM, URETEROSCOPY AND STENT PLACEMENT;  Surgeon: Scarlet Curly, MD;  Location: [facility] ORS;  Service: Urology;  Laterality: Right;   FOOT SURGERY     LAPAROSCOPIC APPENDECTOMY N/A 02/19/2015   Procedure: EXCISION OF MESENTERIC MASS;  Surgeon: Lockie Rima, MD;  Location: [facility] OR;  Service: General;  Laterality: N/A;   REPLACEMENT TOTAL KNEE BILATERAL     Social History:  reports that she quit smoking about 6 years ago. Her smoking use included cigarettes. She has never used smokeless tobacco. She reports that she does not drink alcohol and does not use drugs.  Allergies  Allergen Reactions   Codeine Swelling   Levofloxacin     Other reaction(s): Malaise (intolerance)   Penicillins Swelling   Xarelto [Rivaroxaban]     Causes bleeding in stool and urine   Tape Rash    Family History  Problem Relation Age of Onset   Heart attack Mother     Prior to Admission medications   Medication Sig Start Date End Date Taking? Authorizing Provider  acetaminophen (TYLENOL) 500 MG tablet Take 500 mg by mouth as needed for mild pain (pain score 1-3) or headache.     [provider]  albuterol (PROVENTIL HFA;VENTOLIN HFA) 108 (90 BASE) MCG/ACT inhaler Inhale 2 puffs into the lungs every 6 (six) hours as needed for wheezing or shortness of breath.     [provider]  amiodarone (PACERONE) 200 MG tablet Take 1 tablet (200 mg total) by mouth daily. 03/05/23   Krasowski, Robert J, MD  apixaban (ELIQUIS) 2.5 MG TABS tablet Take 1 tablet (2.5 mg total) by mouth 2 (two) times daily. 06/19/23   Lavada Porteous, DO  budesonide-formoterol [facility]) 160-4.5 MCG/ACT inhaler Inhale 2 puffs into the lungs 2 (two) times daily.    [provider]  carboxymethylcellulose (REFRESH PLUS) 0.5 % SOLN Place 1 drop into both eyes daily as needed (dry eyes).    [provider]  citalopram (CELEXA) 20 MG tablet Take 20 mg by mouth daily. 10/12/16   [provider]  CRANBERRY PO Take 1 tablet by mouth daily.    [provider]  diclofenac Sodium (VOLTAREN) 1 % GEL  APPLY 2 GRAMS TOPICALLY TO AFFECTED AREA THREE TIMES DAILY 08/08/19   Timothy Ford, MD  estradiol (ESTRACE) 0.1 MG/GM vaginal cream Apply 3 times weekly using a pea-sized amount on fingertip as directed 05/13/23   Scarlet Curly, MD  ferrous sulfate 325 (65 FE) MG EC tablet Take 325 mg by mouth daily with breakfast.    [provider]  furosemide (LASIX) 20 MG tablet Take 3 tablets (60 mg total) by mouth daily. 06/20/23   Lavada Porteous, DO  gabapentin (NEURONTIN) 100 MG capsule Take 1 capsule (100 mg total) by mouth 3 (three) times daily. 06/19/23   Lavada Porteous, DO  insulin glargine (LANTUS) 100 UNIT/ML injection Inject 0.1 mLs (10 Units total) into the skin daily. 06/20/23   Lavada Porteous, DO  lidocaine (LIDODERM) 5 % Place 1 patch onto the skin daily. Remove & Discard patch within 12 hours or as directed by MD 06/19/23   Lavada Porteous, DO  loratadine (CLARITIN) 10 MG tablet Take 10 mg by mouth daily.    [provider]  metFORMIN (GLUCOPHAGE) 1000  MG tablet Take 1 tablet (1,000 mg total) by mouth daily with breakfast. 06/20/23   Lavada Porteous, DO  Multiple Vitamin (MULTIVITAMIN) capsule Take 1 capsule by mouth daily.    [provider]  nitroGLYCERIN (NITROSTAT) 0.4 MG SL tablet Place 1 tablet (0.4 mg total) under the tongue every 5 (five) minutes as needed for chest pain. 08/22/20 08/21/27  Krasowski, Robert J, MD  nystatin (MYCOSTATIN/NYSTOP) powder Apply topically 2 (two) times daily. Under right breast for intertrigo 06/19/23   Lavada Porteous, DO  ondansetron (ZOFRAN-ODT) 4 MG disintegrating tablet Take 1 tablet (4 mg total) by mouth every 8 (eight) hours as needed for nausea or vomiting. Patient not taking: Reported on 06/14/2023 06/12/23   Jerri Morale A, FNP  pantoprazole (PROTONIX) 40 MG tablet Take 40 mg by mouth 2 (two) times daily. 11/14/16   [provider]  polyethylene glycol (MIRALAX / GLYCOLAX) 17 g packet Take 17 g by mouth daily. 06/20/23   Lavada Porteous, DO  potassium chloride (MICRO-K) 10 MEQ CR capsule Take 10 mEq by mouth daily. 11/20/14   [provider]  Probiotic Product (CULTRELLE KIDS IMMUNE DEFENSE PO) Take 1 Dose by mouth daily.    [provider]  rOPINIRole (REQUIP) 0.25 MG tablet Take 0.25 mg by mouth at bedtime.    [provider]  rosuvastatin (CRESTOR) 40 MG tablet Take 40 mg by mouth daily.    [provider]  senna (SENOKOT) 8.6 MG TABS tablet Take 2 tablets by mouth at bedtime.    [provider]  sitaGLIPtin (JANUVIA) 100 MG tablet Take 100 mg by mouth daily. 03/11/22   [provider]  spironolactone (ALDACTONE) 25 MG tablet Take 1/2 (one-half) tablet by mouth once daily 03/20/23   Krasowski, Robert J, MD  sulfamethoxazole-trimethoprim (BACTRIM) 400-80 MG tablet Take 1 tablet by mouth at bedtime. 05/13/23   Scarlet Curly, MD  Vitamin D, Ergocalciferol, (DRISDOL) 1.25 MG (50000 UNIT) CAPS capsule Take 50,000 Units by mouth every Sunday.  02/10/23   [provider]    Physical Exam: Vitals:   07/12/23 1200 07/12/23 1300 07/12/23 1551 07/12/23 1614  BP: (!) 155/68 (!) 135/112  (!) 155/65  Pulse: 63 62  61  Resp: 17 (!) 24    Temp:  98.4 F (36.9 C)  98.2 F (36.8 C)  TempSrc:  Oral  Oral  SpO2: 98% 99%  100%  Weight:   107.5 kg   Height:   5\' 3"  (1.6 m)    General:  Appears calm and comfortable and is in NAD Eyes:  EOMI, normal lids, iris ENT:  grossly normal hearing, lips; left anterior tongue ulceration (isolated); mmm Neck:  no LAD, masses or thyromegaly Cardiovascular:  RRR, no m/r/g. Tr LE edema.  Respiratory:   Scattered rhonchi.  Normal to mildly increased respiratory effort. Abdomen:  soft, NT, ND Skin:  no rash or induration seen on limited exam Musculoskeletal:  grossly normal tone BUE/BLE, no bony abnormality, BLE TTP Psychiatric:  grossly normal mood and affect, speech fluent and appropriate, AOx3 Neurologic:  CN 2-12 grossly intact, moves all extremities in coordinated fashion   Radiological Exams on Admission: Independently reviewed - see discussion in A/P where applicable  CT ABDOMEN PELVIS W CONTRAST Result Date: 07/12/2023 CLINICAL DATA:  Clinical concern for diverticulitis EXAM: CT ABDOMEN AND PELVIS WITH CONTRAST TECHNIQUE: Multidetector CT imaging of the abdomen and pelvis was performed using the standard protocol following bolus administration of intravenous contrast. RADIATION DOSE REDUCTION: This exam was performed according to the departmental dose-optimization program which includes automated exposure control, adjustment of the mA and/or kV according to patient size and/or use of iterative reconstruction technique. CONTRAST:  OMNIPAQUE IOHEXOL 300 MG/ML  SOLN COMPARISON:  CT of the abdomen and pelvis performed February 13, 2023 FINDINGS: Lower chest: A small focus of airspace consolidation is present in the left lower lobe. Hepatobiliary: No focal liver abnormality is seen.  Status post cholecystectomy. No biliary dilatation. Pancreas: Nothing significant. Spleen: Nothing significant. Adrenals/Urinary Tract: Calcifications are present in both kidneys. Small low-attenuation foci are present in both kidneys which are incompletely characterized. Urinary bladder is decompressed. Stomach/Bowel: No dilated loops of bowel are seen. Vascular/Lymphatic: Atherosclerotic changes are present in the abdominal aorta and major branch vessels. No abdominal aortic aneurysm. Reproductive: Uterus is surgically absent. Other: Nothing significant. Musculoskeletal: Degenerative changes are present in the imaged osseous structures. IMPRESSION: 1. Left lower lobe pneumonia. 2. No evidence of diverticulitis. Electronically Signed   By: Reagan Camera M.D.   On: 07/12/2023 13:35   DG Chest 2 View Result Date: 07/12/2023 CLINICAL DATA:  Shortness of breath and weakness EXAM: CHEST - 2 VIEW COMPARISON:  06/18/2023 FINDINGS: Cardiomegaly. Mild scarring at the left lung base is stable. There is no edema, consolidation, effusion, or pneumothorax. Extensive artifact from EKG leads. IMPRESSION: No evidence of active disease.  Chronic cardiomegaly. Electronically Signed   By: Ronnette Coke M.D.   On: 07/12/2023 11:13    EKG: Independently reviewed.  NSR with rate 62; IVCD; nonspecific ST changes with no evidence of acute ischemia   Labs on Admission: I have personally reviewed the available labs and imaging studies at the time of the admission.  Pertinent labs:    Glucose 175 Albumin 3.2 BNP 149.7 WBC 5.3 Hgb 9.7 COVID/flu/RSV negative UA: 50 glucose, small Hgb, large LE, 100 protein Urine culture pending Heme positive Sputum culture pending   Assessment and Plan: Principal Problem:   Acute lower GI bleeding Active Problems:   Stage 3a chronic kidney disease (HCC)   COPD (chronic obstructive pulmonary disease) (HCC)   Type 2 diabetes mellitus with hyperlipidemia (HCC)   Class 3 obesity    Chronic diastolic congestive heart failure (HCC)   Left lower lobe pneumonia   Atrial fibrillation (HCC)   Recurrent gross hematuria   Tongue ulceration   Generalized weakness    LLL PNA Patient presenting with  productive cough, SOB, and infiltrate in left lower lobe on CT She was recently discharged from SNF rehab and so is at risk for HCAP Influenza negative. COVID-19 negative. Blood cultures are pending Will start Azithromycin 500 mg IV daily and Rocephin for now Will add albuterol PRN Will add standing Duonebs Will add Mucinex for cough Will monitor on telemetry bed She really wants buttermilk and I see no contraindication despite Dr. Annamaria Barrette strictly forbidding it; further, she accepts the associated risks regardless and so will allow her to have it  Gross hematuria Patient reports intermittent gross hematuria since cystoscopy in December She is also having voiding hesitancy UA is not overly suggestive of UTI but given symptoms this is possible Regardless, she is on antibiotics for the PNA and should be covered for UTI as well Discussed with Dr. Delores Fester, who recommends holding Eliquis and following up outpatient with Dr. Del Favia She is actively being treated with Bactrim BID for UTI, EOT 4/16 (will stop) She also receives Bactrim at bedtime for prophylaxis (will hold)  Hematochezia Likely hemorrhoidal bleeding by description Will hold Eliquis Good bowel regimen GI consulted by ER  Tongue ulceration Uncertain etiology, may need outpatient biopsy She has not had improvement with Nystatin Will add Ambesol for now If not clearing, refer to ENT for biopsy as outpatient  Generalized weakness Acute onset after good response to rehab but recent discharge in the setting of probable active infection Will treat medically and monitor response PT/OT consults  Chronic diastolic CHF Appears to be compensated at this time Hold Lasix, aldactone for now in the setting of active  infection  COPD On 2L home O2 Continue Breo (Symbicort per formulary)  Afib Rate controlled with amiodarone Hold Eliquis in the setting of active bleeding  DM Recent A1c 7.4, at goal for age Hold Glucophage, Januvia Continue glargine Cover with moderate-scale SSI Carb modified diet  Continue gabapentin for neuropathy  HTN She does not appear to be taking medications for this issue at this time   HLD Continue rosuvastatin  Stage 3a CKD Appears to be stable at this time Attempt to avoid nephrotoxic medications Recheck BMP in AM   Mood d/o Continue citalopram  Morbid obesity Body mass index is 41.96 kg/m.Aaron Aas  Weight loss should be encouraged Outpatient PCP/bariatric medicine f/u encouraged Significantly low or high BMI is associated with higher medical risk including morbidity and mortality   DNR I have discussed code status with the patient/family and they are in agreement that the patient would not desire resuscitation and would prefer to die a natural death should that situation arise. DNR confirmed at the time of admission Patient will need a gold out of facility DNR form at the time of discharge     Advance Care Planning:   Code Status: Limited: Do not attempt resuscitation (DNR) -DNR-LIMITED -Do Not Intubate/DNI    Consults: GI; PT/OT  DVT Prophylaxis: SCDs  Family Communication: Daughter and SIL were present as well as grandson and granddaughter-in-law  Severity of Illness: The appropriate patient status for this patient is OBSERVATION. Observation status is judged to be reasonable and necessary in order to provide the required intensity of service to ensure the patient's safety. The patient's presenting symptoms, physical exam findings, and initial radiographic and laboratory data in the context of their medical condition is felt to place them at decreased risk for further clinical deterioration. Furthermore, it is anticipated that the patient will be  medically stable for discharge from the hospital within 2 midnights  of admission.   Author: Lorita Rosa, MD 07/12/2023 4:38 PM  For on call review [email or web address].

## 2023-07-13 ENCOUNTER — Telehealth: Payer: Self-pay | Admitting: Nurse Practitioner

## 2023-07-13 DIAGNOSIS — E1169 Type 2 diabetes mellitus with other specified complication: Secondary | ICD-10-CM | POA: Diagnosis present

## 2023-07-13 DIAGNOSIS — D631 Anemia in chronic kidney disease: Secondary | ICD-10-CM | POA: Diagnosis present

## 2023-07-13 DIAGNOSIS — J440 Chronic obstructive pulmonary disease with acute lower respiratory infection: Secondary | ICD-10-CM | POA: Diagnosis present

## 2023-07-13 DIAGNOSIS — K5730 Diverticulosis of large intestine without perforation or abscess without bleeding: Secondary | ICD-10-CM | POA: Diagnosis not present

## 2023-07-13 DIAGNOSIS — I480 Paroxysmal atrial fibrillation: Secondary | ICD-10-CM | POA: Diagnosis present

## 2023-07-13 DIAGNOSIS — K625 Hemorrhage of anus and rectum: Secondary | ICD-10-CM

## 2023-07-13 DIAGNOSIS — R627 Adult failure to thrive: Secondary | ICD-10-CM | POA: Diagnosis present

## 2023-07-13 DIAGNOSIS — N3001 Acute cystitis with hematuria: Secondary | ICD-10-CM | POA: Diagnosis present

## 2023-07-13 DIAGNOSIS — K648 Other hemorrhoids: Secondary | ICD-10-CM | POA: Diagnosis present

## 2023-07-13 DIAGNOSIS — K922 Gastrointestinal hemorrhage, unspecified: Secondary | ICD-10-CM | POA: Diagnosis not present

## 2023-07-13 DIAGNOSIS — J9611 Chronic respiratory failure with hypoxia: Secondary | ICD-10-CM | POA: Diagnosis present

## 2023-07-13 DIAGNOSIS — I5032 Chronic diastolic (congestive) heart failure: Secondary | ICD-10-CM | POA: Diagnosis present

## 2023-07-13 DIAGNOSIS — Z66 Do not resuscitate: Secondary | ICD-10-CM | POA: Diagnosis present

## 2023-07-13 DIAGNOSIS — J189 Pneumonia, unspecified organism: Secondary | ICD-10-CM | POA: Diagnosis present

## 2023-07-13 DIAGNOSIS — E66813 Obesity, class 3: Secondary | ICD-10-CM | POA: Diagnosis present

## 2023-07-13 DIAGNOSIS — G2581 Restless legs syndrome: Secondary | ICD-10-CM | POA: Diagnosis present

## 2023-07-13 DIAGNOSIS — Z7189 Other specified counseling: Secondary | ICD-10-CM | POA: Diagnosis not present

## 2023-07-13 DIAGNOSIS — Z6841 Body Mass Index (BMI) 40.0 and over, adult: Secondary | ICD-10-CM | POA: Diagnosis not present

## 2023-07-13 DIAGNOSIS — Z515 Encounter for palliative care: Secondary | ICD-10-CM | POA: Diagnosis not present

## 2023-07-13 DIAGNOSIS — E1140 Type 2 diabetes mellitus with diabetic neuropathy, unspecified: Secondary | ICD-10-CM | POA: Diagnosis present

## 2023-07-13 DIAGNOSIS — K649 Unspecified hemorrhoids: Secondary | ICD-10-CM

## 2023-07-13 DIAGNOSIS — I130 Hypertensive heart and chronic kidney disease with heart failure and stage 1 through stage 4 chronic kidney disease, or unspecified chronic kidney disease: Secondary | ICD-10-CM | POA: Diagnosis present

## 2023-07-13 DIAGNOSIS — E1122 Type 2 diabetes mellitus with diabetic chronic kidney disease: Secondary | ICD-10-CM | POA: Diagnosis present

## 2023-07-13 DIAGNOSIS — Z794 Long term (current) use of insulin: Secondary | ICD-10-CM | POA: Diagnosis not present

## 2023-07-13 DIAGNOSIS — J431 Panlobular emphysema: Secondary | ICD-10-CM | POA: Diagnosis present

## 2023-07-13 DIAGNOSIS — E1165 Type 2 diabetes mellitus with hyperglycemia: Secondary | ICD-10-CM | POA: Diagnosis present

## 2023-07-13 DIAGNOSIS — D62 Acute posthemorrhagic anemia: Secondary | ICD-10-CM | POA: Diagnosis present

## 2023-07-13 DIAGNOSIS — F39 Unspecified mood [affective] disorder: Secondary | ICD-10-CM | POA: Diagnosis present

## 2023-07-13 DIAGNOSIS — R0682 Tachypnea, not elsewhere classified: Secondary | ICD-10-CM | POA: Diagnosis present

## 2023-07-13 DIAGNOSIS — N1831 Chronic kidney disease, stage 3a: Secondary | ICD-10-CM | POA: Diagnosis present

## 2023-07-13 DIAGNOSIS — Z7901 Long term (current) use of anticoagulants: Secondary | ICD-10-CM

## 2023-07-13 LAB — CBC
HCT: 28.8 % — ABNORMAL LOW (ref 36.0–46.0)
Hemoglobin: 8.7 g/dL — ABNORMAL LOW (ref 12.0–15.0)
MCH: 29.9 pg (ref 26.0–34.0)
MCHC: 30.2 g/dL (ref 30.0–36.0)
MCV: 99 fL (ref 80.0–100.0)
Platelets: 223 10*3/uL (ref 150–400)
RBC: 2.91 MIL/uL — ABNORMAL LOW (ref 3.87–5.11)
RDW: 15 % (ref 11.5–15.5)
WBC: 4.6 10*3/uL (ref 4.0–10.5)
nRBC: 0 % (ref 0.0–0.2)

## 2023-07-13 LAB — URINE CULTURE: Culture: <10000 — AB

## 2023-07-13 LAB — BASIC METABOLIC PANEL WITH GFR
Anion gap: 6 (ref 5–15)
BUN: 21 mg/dL (ref 8–23)
CO2: 27 mmol/L (ref 22–32)
Calcium: 8.7 mg/dL — ABNORMAL LOW (ref 8.9–10.3)
Chloride: 104 mmol/L (ref 98–111)
Creatinine, Ser: 0.66 mg/dL (ref 0.44–1.00)
GFR, Estimated: >60 mL/min (ref >60)
Glucose, Bld: 208 mg/dL — ABNORMAL HIGH (ref 70–99)
Potassium: 4.2 mmol/L (ref 3.5–5.1)
Sodium: 137 mmol/L (ref 135–145)

## 2023-07-13 MED ORDER — GABAPENTIN 100 MG PO CAPS
100.0000 mg | ORAL_CAPSULE | Freq: Every day | ORAL | Status: DC
  Administered 2023-07-14 – 2023-07-15 (×2): 100 mg via ORAL
  Filled 2023-07-13 (×2): qty 1

## 2023-07-13 MED ORDER — HYDROCORT-PRAMOXINE (PERIANAL) 2.5-1 % EX CREA
TOPICAL_CREAM | Freq: Two times a day (BID) | CUTANEOUS | Status: DC
  Administered 2023-07-14: 1 via RECTAL
  Filled 2023-07-13: qty 30

## 2023-07-13 NOTE — Plan of Care (Signed)

## 2023-07-13 NOTE — Telephone Encounter (Signed)
 Danielle [name], patient is currently admitted to [facility] with pneumonia and she had one episode of rectal bleeding, likely hemorrhoidal. Pls contact patient in a few days to schedule a follow up appointment with Dr. Venice Gillis in 3 months due to having chronic IDA. Prior to that appointment, send her to our lab  for a CBC, IBC + ferritin panel as recommended by Dr. Brice Campi. THX.

## 2023-07-13 NOTE — Consult Note (Signed)
 Referring Provider: Maxwell Marion PA-C Primary Care Physician:  Gordan Payment., MD Primary Gastroenterologist:  Dr. Chales Abrahams   Reason for Consultation:  Birder Robson red rectal bleeding   HPI: Danielle [name] is a 89 y.o. female is an 89 year old female with a past medical history of arthritis, depression, hypertension, hyperlipidemia, coronary artery disease, chronic diastolic CHF with LV EF 60 - 65%, atrial fibrillation on Eliquis, DM type II, CKD stage 3a, COPD, OSA, restless leg syndrome, GERD, colon polyps, recurrent UTIs and kidney stones s/p cystoscopy, ureteroscopy, laser lithotripsy and stent placement on 03/17/2023   She was recently admitted to the hospital 3/15 - 06/20/2023 with acute hypoxic respiratory failure secondary to influenza A. Treated with Tamiflu, DuoNeb and Albuteral nebulizers. She was discharged to SNF then subsequently discharged home 07/05/2023.  On oxygen 2L  at home. She developed worsening cough with green sputum with associated fatigue and DOE. She passed a hard stool with a moderate amount of red blood which she attributed to having hemorrhoids. She also noted having intermittent hematuria since 12/2022. Recently treated with Macrobid for a UTI which she stopped taking because it resulted in nausea. She presented to the ED 07/12/2023 for further evaluation. Labs in the ED showed a WBC count of 5.3. Hg 9.7 (Hg 9.2 three weeks ago). Hg 9.7. PLTs 256. Glucose 175. BUN 19. Cr 0.74. Albumin 3.2. T. Bili 0.3. Alk phos 61. AST 27. ALT 18. BNP 149.7.  FOBT positive. Sars Coronavirus 2 negative. Urinalysis + for  leukocytes and Hg. Urine culture in process. Blood cultures pending. Chest xray was negative. CTAP with contrast identified left lower lobe pneumonia without evidence of diverticulitis or colitis. GI consult requested for further evaluation regarding bright red rectal bleeding and anemia.   Labs today: Hg 8.7. HCT 28.8.  She passed a hard stool on Saturday 07/11/2023 which  resulted in passing a small to moderate amount of bright red blood per the rectum which she attributed to having hemorrhoids. She has intermittent constipation for which she takes two stool softeners at bed time and Senna laxative tab as needed. No abdominal pain. No further rectal bleeding since admission. Last dose of Eliquis was taken on 4/12 at 6pm. She underwent a colonoscopy by Dr. Chales Abrahams 11/17/2018 which identified 5 polys removed from the colon and rectum, diverticulosis to the descending and sigmoid colon and moderate internal hemorrhoids.  No current nausea.  No GERD symptoms.  No chest pain or shortness of breath.  Her daughter and she is at the bedside.  ECHO 02/04/2022:  Left ventricular ejection fraction, by estimation, is 60 to 65%. The left ventricle has normal function. The left ventricle has no regional wall motion abnormalities. Left ventricular diastolic parameters are consistent with Grade I diastolic dysfunction (impaired relaxation). 1. Right ventricular systolic function is normal. The right ventricular size is normal. There is normal pulmonary artery systolic pressure. 2. The mitral valve is normal in structure. No evidence of mitral valve regurgitation. No evidence of mitral stenosis. 3. The aortic valve is calcified. Aortic valve regurgitation is not visualized. Aortic valve sclerosis/calcification is present, without any evidence of aortic stenosis. 4. The inferior vena cava is normal in size with greater than 50% respiratory variability, suggesting right atrial pressure of 3 mmHg.   GI PROCEDURES:   Colonoscopy 11/17/2018 by Dr. Chales Abrahams:  - Two sessile polyps were found in the proximal transverse colon. The polyps were 4 to 8 mm in size. Smaller polyp was removed with a cold  snare and the bigger with a hot snare. Findings:  - Three sessile polyps were found in the rectum and mid descending colon. The polyps were 4 to 8 mm in size. The larger polyp was removed with a hot snare and  smaller polyps were removed with a cold snare.  - Multiple small and large-mouthed diverticula were found in the sigmoid colon and descending colon. Moderate melanosis throughout the colon more prominent in the right colon.  - Non-bleeding internal hemorrhoids were found during retroflexion. The hemorrhoids were moderate.   - The exam was otherwise without abnormality.  - 3 year recall colonoscopy  1. Surgical [P], colon, transverse, polyp  - TUBULAR ADENOMA (1 OF 1 FRAGMENTS)  - NO HIGH GRADE DYSPLASIA OR MALIGNANCY IDENTIFIED  2. Surgical [P], colon, descending, rectum, polyp (3)  - TUBULAR ADENOMA (5 OF 5 FRAGMENTS)  - NO HIGH GRADE DYSPLASIA OR MALIGNANCY IDENTIFIED   Past Medical History:  Diagnosis Date   Anginal pain (HCC)    Arthritis    Asthma    At high risk for falls 11/11/2017   CHF (congestive heart failure) (HCC)    Chronic diastolic congestive heart failure (HCC) 11/11/2015   Chronic insomnia 10/24/2015   Chronic pain of both knees 05/14/2018   Chronic respiratory failure with hypoxia (HCC) 05/05/2017   CKD (chronic kidney disease) stage 3, GFR 30-59 ml/min (HCC) 05/07/2017   COPD (chronic obstructive pulmonary disease) (HCC)    Coronary artery disease    Coronary artery disease involving native coronary artery of native heart without angina pectoris 11/14/2014   Overview:  40% RCA a cardiac catheter in 2014   Current mild episode of major depressive disorder (HCC) 11/14/2014   Depression    DOE (dyspnea on exertion) 09/07/2018   Dysphagia 08/12/2018   Edema 08/12/2018   Elevated blood sugar 11/09/2015   Emphysema lung (HCC)    Flat feet, bilateral 05/30/2020   Gastroesophageal reflux disease without esophagitis 11/11/2017   GERD (gastroesophageal reflux disease)    Headache    History of hiatal hernia    History of kidney stones    Hyperlipidemia    Hypertension    Insomnia    Internal hemorrhoids 11/26/2018   Iron deficiency anemia 08/12/2018   Malaise  and fatigue 08/14/2015   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/26/2018   Mesenteric mass 02/19/2015   Midsternal chest pain 11/11/2015   Mixed hyperlipidemia 11/14/2014   Morbid obesity (HCC) 09/07/2018   OSA (obstructive sleep apnea) 12/16/2019   Osteoporosis    Panlobular emphysema (HCC) 11/09/2015   Pneumonia 2013   PONV (postoperative nausea and vomiting)    Positive D-dimer 09/08/2018   Primary osteoarthritis involving multiple joints 08/14/2015   Restless legs syndrome 03/04/2018   Senile purpura (HCC) 08/12/2018   Shortness of breath dyspnea    with exertion   Stasis edema of left lower extremity 04/13/2018   Strain of right shoulder 08/12/2018   Type 2 diabetes mellitus with stage 3 chronic kidney disease (HCC) 08/14/2015   Unstable angina (HCC) 10/30/2018   Vitamin B12 deficiency 04/13/2018   Vitamin D deficiency 02/27/2016    Past Surgical History:  Procedure Laterality Date   ABDOMINAL HYSTERECTOMY     APPENDECTOMY     BREAST SURGERY  40 yrs. ago   growth removed in each breast-benign   CARDIAC CATHETERIZATION     12/14/12 LHC ([facility]): few mild stenotic areas max 40% of ectatic RCA o/w NL coronaries, EF 65%. Med tx.   CATARACT EXTRACTION W/ INTRAOCULAR  LENS  IMPLANT, BILATERAL Bilateral 15 yrs ago   CHOLECYSTECTOMY     COLONOSCOPY  04/22/2013   Diverticulosis in the sigmoid colon. Non bleeding internal hemorrhoids. Normal mucosa vascular pattern in the entire examined colon. Three 6 mm polyps n the descending colon. Resected and retrieved.    CYSTOSCOPY WITH RETROGRADE PYELOGRAM, URETEROSCOPY AND STENT PLACEMENT Right 02/14/2023   Procedure: CYSTOSCOPY WITH RETROGRADE PYELOGRAM, URETEROSCOPY AND STENT PLACEMENT;  Surgeon: Scarlet Curly, MD;  Location: [facility] OR;  Service: Urology;  Laterality: Right;   CYSTOSCOPY WITH RETROGRADE PYELOGRAM, URETEROSCOPY AND STENT PLACEMENT Right 03/17/2023   Procedure: CYSTOSCOPY WITH RETROGRADE PYELOGRAM, URETEROSCOPY AND STENT PLACEMENT;  Surgeon: Scarlet Curly, MD;  Location: [facility] ORS;  Service: Urology;  Laterality: Right;   FOOT SURGERY     LAPAROSCOPIC APPENDECTOMY N/A 02/19/2015   Procedure: EXCISION OF MESENTERIC MASS;  Surgeon: Lockie Rima, MD;  Location: [facility] OR;  Service: General;  Laterality: N/A;   REPLACEMENT TOTAL KNEE BILATERAL      Prior to Admission medications   Medication Sig Start Date End Date Taking? Authorizing Provider  acetaminophen (TYLENOL) 500 MG tablet Take 500 mg by mouth as needed for mild pain (pain score 1-3) or headache.   Yes [provider]  albuterol (PROVENTIL HFA;VENTOLIN HFA) 108 (90 BASE) MCG/ACT inhaler Inhale 2 puffs into the lungs every 6 (six) hours as needed for wheezing or shortness of breath.    Yes [provider]  amiodarone (PACERONE) 200 MG tablet Take 1 tablet (200 mg total) by mouth daily. 03/05/23  Yes Krasowski, Robert J, MD  apixaban (ELIQUIS) 2.5 MG TABS tablet Take 1 tablet (2.5 mg total) by mouth 2 (two) times daily. 06/19/23  Yes Lavada Porteous, DO  budesonide-formoterol (SYMBICORT) 160-4.5 MCG/ACT inhaler Inhale 2 puffs into the lungs 2 (two) times daily.   Yes [provider]  carboxymethylcellulose (REFRESH PLUS) 0.5 % SOLN Place 1 drop into both eyes daily as needed (dry eyes).   Yes [provider]  citalopram (CELEXA) 20 MG tablet Take 20 mg by mouth daily. 10/12/16  Yes [provider]  CRANBERRY PO Take 1 tablet by mouth daily.   Yes [provider]  diclofenac Sodium (VOLTAREN) 1 % GEL APPLY 2 GRAMS TOPICALLY TO AFFECTED AREA THREE TIMES DAILY 08/08/19  Yes Timothy Ford, MD  estradiol (ESTRACE) 0.1 MG/GM vaginal cream Apply 3 times weekly using a pea-sized amount on fingertip as directed 05/13/23  Yes Scarlet Curly, MD  ferrous sulfate 325 (65 FE) MG EC tablet Take 325 mg by mouth daily with breakfast.   Yes [provider]  furosemide (LASIX) 20 MG tablet Take 3 tablets (60 mg total) by mouth daily. 06/20/23  Yes  Lavada Porteous, DO  gabapentin (NEURONTIN) 100 MG capsule Take 1 capsule (100 mg total) by mouth 3 (three) times daily. 06/19/23  Yes Lavada Porteous, DO  insulin glargine (LANTUS) 100 UNIT/ML injection Inject 0.1 mLs (10 Units total) into the skin daily. 06/20/23  Yes Lavada Porteous, DO  lidocaine (LIDODERM) 5 % Place 1 patch onto the skin daily. Remove & Discard patch within 12 hours or as directed by MD 06/19/23  Yes Lavada Porteous, DO  loratadine (CLARITIN) 10 MG tablet Take 10 mg by mouth daily.   Yes [provider]  metFORMIN (GLUCOPHAGE) 1000 MG tablet Take 1 tablet (1,000 mg total) by mouth daily with breakfast. 06/20/23  Yes Lavada Porteous, DO  Multiple Vitamin (MULTIVITAMIN) capsule Take 1 capsule by mouth  daily.   Yes [provider]  nitroGLYCERIN (NITROSTAT) 0.4 MG SL tablet Place 1 tablet (0.4 mg total) under the tongue every 5 (five) minutes as needed for chest pain. 08/22/20 08/21/27 Yes Georgeanna Lea, MD  nystatin (MYCOSTATIN) 100000 UNIT/ML suspension Take 5 mLs by mouth 4 (four) times daily. 07/09/23 07/19/23 Yes [provider]  nystatin (MYCOSTATIN/NYSTOP) powder Apply topically 2 (two) times daily. Under right breast for intertrigo 06/19/23  Yes Tiffany Kocher, DO  pantoprazole (PROTONIX) 40 MG tablet Take 40 mg by mouth 2 (two) times daily. 11/14/16  Yes [provider]  polyethylene glycol (MIRALAX / GLYCOLAX) 17 g packet Take 17 g by mouth daily. 06/20/23  Yes Tiffany Kocher, DO  potassium chloride (MICRO-K) 10 MEQ CR capsule Take 10 mEq by mouth daily. 11/20/14  Yes [provider]  Probiotic Product (CULTRELLE KIDS IMMUNE DEFENSE PO) Take 1 Dose by mouth daily.   Yes [provider]  rOPINIRole (REQUIP) 0.25 MG tablet Take 0.25 mg by mouth at bedtime.   Yes [provider]  rosuvastatin (CRESTOR) 40 MG tablet Take 40 mg by mouth daily.   Yes [provider]  senna (SENOKOT) 8.6 MG TABS tablet Take 2  tablets by mouth at bedtime.   Yes [provider]  sitaGLIPtin (JANUVIA) 100 MG tablet Take 100 mg by mouth daily. 03/11/22  Yes [provider]  sulfamethoxazole-trimethoprim (BACTRIM) 400-80 MG tablet Take 1 tablet by mouth at bedtime. 05/13/23  Yes Joline Maxcy, MD  Vitamin D, Ergocalciferol, (DRISDOL) 1.25 MG (50000 UNIT) CAPS capsule Take 50,000 Units by mouth every Sunday. 02/10/23  Yes [provider]  ONETOUCH ULTRA test strip 1 each by Other route daily. 06/16/23   [provider]  spironolactone (ALDACTONE) 25 MG tablet Take 1/2 (one-half) tablet by mouth once daily Patient not taking: Reported on 07/12/2023 03/20/23   Georgeanna Lea, MD    Current Facility-Administered Medications  Medication Dose Route Frequency Provider Last Rate Last Admin   acetaminophen (TYLENOL) tablet 650 mg  650 mg Oral Q6H PRN Jonah Blue, MD       Or   acetaminophen (TYLENOL) suppository 650 mg  650 mg Rectal Q6H PRN Jonah Blue, MD       albuterol (PROVENTIL) (2.5 MG/3ML) 0.083% nebulizer solution 2.5 mg  2.5 mg Nebulization Q2H PRN Jonah Blue, MD   2.5 mg at 07/13/23 0156   amiodarone (PACERONE) tablet 200 mg  200 mg Oral Daily Jonah Blue, MD   200 mg at 07/12/23 1814   benzocaine (ORAJEL) 10 % mucosal gel   Mouth/Throat BID PRN Jonah Blue, MD       bisacodyl (DULCOLAX) EC tablet 5 mg  5 mg Oral Daily PRN Jonah Blue, MD       cefTRIAXone (ROCEPHIN) 2 g in sodium chloride 0.9 % 100 mL IVPB  2 g Intravenous Daily Jonah Blue, MD       citalopram (CELEXA) tablet 20 mg  20 mg Oral Daily Jonah Blue, MD   20 mg at 07/12/23 1815   diclofenac Sodium (VOLTAREN) 1 % topical gel 2 g  2 g Topical QID Danford Bad, RPH   2 g at 07/12/23 2030   doxycycline (VIBRAMYCIN) 100 mg in sodium chloride 0.9 % 250 mL IVPB  100 mg Intravenous Steva Colder, MD       ferrous sulfate tablet 325 mg  325 mg Oral Q breakfast Jonah Blue, MD  fluticasone furoate-vilanterol (BREO ELLIPTA) 200-25 MCG/ACT 1 puff  1 puff Inhalation Daily Jonah Blue, MD       gabapentin (NEURONTIN) capsule 100 mg  100 mg Oral TID Jonah Blue, MD   100 mg at 07/12/23 2115   guaiFENesin (MUCINEX) 12 hr tablet 600 mg  600 mg Oral BID PRN Jonah Blue, MD       guaiFENesin (ROBITUSSIN) 100 MG/5ML liquid 5 mL  5 mL Oral Once Jonah Blue, MD       hydrALAZINE (APRESOLINE) injection 5 mg  5 mg Intravenous Q4H PRN Jonah Blue, MD       HYDROcodone-acetaminophen (NORCO/VICODIN) 5-325 MG per tablet 1-2 tablet  1-2 tablet Oral Q4H PRN Jonah Blue, MD   1 tablet at 07/13/23 0111   insulin glargine-yfgn (SEMGLEE) injection 10 Units  10 Units Subcutaneous Daily Jonah Blue, MD       lactobacillus (FLORANEX/LACTINEX) granules 1 g  1 g Oral Q breakfast Wofford, Deirdre Evener, RPH       loratadine (CLARITIN) tablet 10 mg  10 mg Oral Daily Jonah Blue, MD   10 mg at 07/12/23 1814   melatonin tablet 5 mg  5 mg Oral QHS PRN Luiz Iron, NP   5 mg at 07/13/23 0111   nystatin (MYCOSTATIN/NYSTOP) topical powder   Topical BID Jonah Blue, MD   Given at 07/12/23 2116   ondansetron [facility]) tablet 4 mg  4 mg Oral Q6H PRN Jonah Blue, MD       Or   ondansetron [facility]) injection 4 mg  4 mg Intravenous Q6H PRN Jonah Blue, MD   4 mg at 07/13/23 0156   pantoprazole (PROTONIX) EC tablet 40 mg  40 mg Oral BID Jonah Blue, MD   40 mg at 07/12/23 2115   polyethylene glycol (MIRALAX / GLYCOLAX) packet 17 g  17 g Oral Daily Jonah Blue, MD       polyvinyl alcohol (LIQUIFILM TEARS) 1.4 % ophthalmic solution 1 drop  1 drop Both Eyes Daily PRN Jonah Blue, MD       rOPINIRole (REQUIP) tablet 0.25 mg  0.25 mg Oral Noemi Chapel, MD   0.25 mg at 07/12/23 2116   rosuvastatin (CRESTOR) tablet 40 mg  40 mg Oral Daily Jonah Blue, MD   40 mg at 07/12/23 1814   senna (SENOKOT) tablet 17.2 mg  2 tablet Oral Noemi Chapel, MD    17.2 mg at 07/12/23 2115   sodium chloride flush (NS) 0.9 % injection 3 mL  3 mL Intravenous Steva Colder, MD   3 mL at 07/12/23 2117   Facility-Administered Medications Ordered in Other Encounters  Medication Dose Route Frequency Provider Last Rate Last Admin   clindamycin (CLEOCIN) 900 mg in dextrose 5 % 50 mL IVPB  900 mg Intravenous 60 min Pre-Op Almond Lint, MD       And   gentamicin (GARAMYCIN) 5 mg/kg in dextrose 5 % 50 mL IVPB  5 mg/kg Intravenous 60 min Pre-Op Almond Lint, MD        Allergies as of 07/12/2023 - Review Complete 07/12/2023  Allergen Reaction Noted   Codeine Swelling 12/15/2014   Penicillins Swelling 12/15/2014   Xarelto [rivaroxaban] Other (See Comments) 03/04/2023   Levofloxacin Other (See Comments) 09/24/2016   Tape Rash 12/15/2014    Family History  Problem Relation Age of Onset   Heart attack Mother     Social History   Socioeconomic History   Marital status: Married  Spouse name: Not on file   Number of children: Not on file   Years of education: Not on file   Highest education level: Not on file  Occupational History   Not on file  Tobacco Use   Smoking status: Former    Current packs/day: 0.00    Types: Cigarettes    Quit date: 08/21/2016    Years since quitting: 6.8   Smokeless tobacco: Never  Vaping Use   Vaping status: Never Used  Substance and Sexual Activity   Alcohol use: No   Drug use: No   Sexual activity: Not Currently  Other Topics Concern   Not on file  Social History Narrative   Not on file   Social Drivers of Health   Financial Resource Strain: Not on file  Food Insecurity: No Food Insecurity (07/12/2023)   Hunger Vital Sign    Worried About Running Out of Food in the Last Year: Never true    Ran Out of Food in the Last Year: Never true  Transportation Needs: No Transportation Needs (07/12/2023)   PRAPARE - Administrator, Civil Service (Medical): No    Lack of Transportation (Non-Medical):  No  Physical Activity: Not on file  Stress: Not on file  Social Connections: Moderately Isolated (07/12/2023)   Social Connection and Isolation Panel [NHANES]    Frequency of Communication with Friends and Family: Once a week    Frequency of Social Gatherings with Friends and Family: More than three times a week    Attends Religious Services: Never    Database administrator or Organizations: Yes    Attends Banker Meetings: Never    Marital Status: Widowed  Intimate Partner Violence: Not At Risk (07/12/2023)   Humiliation, Afraid, Rape, and Kick questionnaire    Fear of Current or Ex-Partner: No    Emotionally Abused: No    Physically Abused: No    Sexually Abused: No    Review of Systems: Gen: Denies fever, sweats or chills. No weight loss.  CV: Denies chest pain, palpitations or edema. Resp: Denies cough, shortness of breath of hemoptysis.  GI: See HPI.   GU : Denies urinary burning, blood in urine, increased urinary frequency or incontinence. MS: + Bilateral leg pain, restless leg syndrome. Derm: Denies rash, itchiness, skin lesions or unhealing ulcers. Psych: + Depression. Heme: Denies easy bruising, bleeding. Neuro:  Denies headaches, dizziness or paresthesias. Endo:  + DM type II.  Physical Exam: Vital signs in last 24 hours: Temp:  [97.6 F (36.4 C)-98.5 F (36.9 C)] 97.6 F (36.4 C) (04/14 0440) Pulse Rate:  [54-64] 54 (04/14 0440) Resp:  [17-24] 17 (04/14 0440) BP: (125-167)/(53-112) 125/83 (04/14 0440) SpO2:  [98 %-100 %] 100 % (04/14 0440) Weight:  [107.5 kg-111.1 kg] 107.5 kg (04/13 1551) Last BM Date : 07/12/23 General: Fatigued appearing 89 year old female in no acute distress. Head:  Normocephalic and atraumatic. Eyes:  No scleral icterus. Conjunctiva pink. Ears:  Normal auditory acuity. Nose:  No deformity, discharge or lesions. Mouth: Absent dentition.  Mild ulceration to the tip of tongue.  Mild thrush.  Neck:  Supple. No lymphadenopathy  or thyromegaly.  Lungs: Breath sounds clear throughout. No wheezes, rhonchi or crackles.  On oxygen 2 L nasal cannula. Heart: Irregular rhythm, no murmurs. Abdomen: Obese abdomen, soft, nondistended.  Positive bowel sounds x 4 quadrants.  No palpable mass. Rectal: Small noninflamed nonbleeding external hemorrhoids within the small hypertrophied papilla and internal hemorrhoids palpated without prolapse  or bleeding.  No stool or blood in the rectal vault.  Maranda Senegal, PA-C and daughter at the bedside at time of exam. Musculoskeletal:  Symmetrical without gross deformities.  Pulses:  Normal pulses noted. Extremities: Bilateral lower extremity edema. Neurologic:  Alert to name and place, disoriented to year. Skin: Scattered patches of ecchymosis to the lower extremities. Psych:  Alert and cooperative. Normal mood and affect.  Intake/Output from previous day: 04/13 0701 - 04/14 0700 In: 120 [P.O.:120] Out: -  Intake/Output this shift: No intake/output data recorded.  Lab Results: Recent Labs    07/12/23 1014 07/13/23 0544  WBC 5.3 4.6  HGB 9.7* 8.7*  HCT 32.3* 28.8*  PLT 256 223   BMET Recent Labs    07/12/23 1014  NA 136  K 3.8  CL 103  CO2 26  GLUCOSE 175*  BUN 19  CREATININE 0.74  CALCIUM 8.5*   LFT Recent Labs    07/12/23 1014  PROT 6.6  ALBUMIN 3.2*  AST 27  ALT 18  ALKPHOS 61  BILITOT 0.3   PT/INR No results for input(s): "LABPROT", "INR" in the last 72 hours. Hepatitis Panel No results for input(s): "HEPBSAG", "HCVAB", "HEPAIGM", "HEPBIGM" in the last 72 hours.   Studies/Results: CT ABDOMEN PELVIS W CONTRAST Result Date: 07/12/2023 CLINICAL DATA:  Clinical concern for diverticulitis EXAM: CT ABDOMEN AND PELVIS WITH CONTRAST TECHNIQUE: Multidetector CT imaging of the abdomen and pelvis was performed using the standard protocol following bolus administration of intravenous contrast. RADIATION DOSE REDUCTION: This exam was performed according to the  departmental dose-optimization program which includes automated exposure control, adjustment of the mA and/or kV according to patient size and/or use of iterative reconstruction technique. CONTRAST:  OMNIPAQUE IOHEXOL 300 MG/ML  SOLN COMPARISON:  CT of the abdomen and pelvis performed February 13, 2023 FINDINGS: Lower chest: A small focus of airspace consolidation is present in the left lower lobe. Hepatobiliary: No focal liver abnormality is seen. Status post cholecystectomy. No biliary dilatation. Pancreas: Nothing significant. Spleen: Nothing significant. Adrenals/Urinary Tract: Calcifications are present in both kidneys. Small low-attenuation foci are present in both kidneys which are incompletely characterized. Urinary bladder is decompressed. Stomach/Bowel: No dilated loops of bowel are seen. Vascular/Lymphatic: Atherosclerotic changes are present in the abdominal aorta and major branch vessels. No abdominal aortic aneurysm. Reproductive: Uterus is surgically absent. Other: Nothing significant. Musculoskeletal: Degenerative changes are present in the imaged osseous structures. IMPRESSION: 1. Left lower lobe pneumonia. 2. No evidence of diverticulitis. Electronically Signed   By: Reagan Camera M.D.   On: 07/12/2023 13:35   DG Chest 2 View Result Date: 07/12/2023 CLINICAL DATA:  Shortness of breath and weakness EXAM: CHEST - 2 VIEW COMPARISON:  06/18/2023 FINDINGS: Cardiomegaly. Mild scarring at the left lung base is stable. There is no edema, consolidation, effusion, or pneumothorax. Extensive artifact from EKG leads. IMPRESSION: No evidence of active disease.  Chronic cardiomegaly. Electronically Signed   By: Ronnette Coke M.D.   On: 07/12/2023 11:13    IMPRESSION/PLAN:  89 year old female readmitted  to the hospital 07/12/2023 with cough, SOB and green sputum with generalized weakness. Recently admitted with acute hypoxic respiratory failure secondary to influenza A. CT identified LLL pneumonia.  Sars Coronavirus 2 negative. On Azithromycin and Rocephin IV. - Management per the medical service  Hematochezia, suspect hemorrhoidal.  CTAP showed diverticulosis without evidence of diverticulitis.  Last dose of Eliquis was 6 PM on 4/12. - Analpram HC 2.5% apply small amount inside the anal opening  to the external anal area twice daily for 7 days - No plans for possible sigmoidoscopy/colonoscopy at this time - MiraLAX nightly -Heart healthy diet -Consider abdominal/pelvic CTA if patient develops brisk GI bleeding - Await further recommendations per Dr. Brice Campi  Hematuria, recent UTI treated with Bactrim.  Eliquis on hold. - Management per the medical service  Acute on chronic anemia secondary to CKD with GI blood loss and hematuria. Admission Hg 9.7 ( Hg 9.2 two weeks ago) -> today Hg 8.7.  On oral iron at home. - Transfuse for hemoglobin level less than 8 - CBC in a.m.  Afib on Eliquis and Amiodarone.  Last dose of Eliquis was 6 PM on 4/12. -Continue to hold Eliquis   Chronic diastolic CHF  COPD on home oxygen 2L [place]  CKD stage IIIa  GERD - Continue Pantoprazole 40mg  bid   Tory Freiberg  07/13/2023, 10:14 AM

## 2023-07-13 NOTE — Plan of Care (Signed)
  Problem: Education: Goal: Knowledge of General Education information will improve Description: Including pain rating scale, medication(s)/side effects and non-pharmacologic comfort measures Outcome: Progressing   Problem: Health Behavior/Discharge Planning: Goal: Ability to manage health-related needs will improve Outcome: Progressing   Problem: Clinical Measurements: Goal: Ability to maintain clinical measurements within normal limits will improve Outcome: Progressing Goal: Will remain free from infection Outcome: Progressing Goal: Diagnostic test results will improve Outcome: Progressing Goal: Respiratory complications will improve Outcome: Progressing Goal: Cardiovascular complication will be avoided Outcome: Progressing   Problem: Activity: Goal: Risk for activity intolerance will decrease Outcome: Progressing   Problem: Nutrition: Goal: Adequate nutrition will be maintained Outcome: Progressing   Problem: Coping: Goal: Level of anxiety will decrease Outcome: Progressing   Problem: Elimination: Goal: Will not experience complications related to bowel motility Outcome: Progressing Goal: Will not experience complications related to urinary retention Outcome: Progressing   Problem: Pain Managment: Goal: General experience of comfort will improve and/or be controlled Outcome: Progressing   Problem: Safety: Goal: Ability to remain free from injury will improve Outcome: Progressing   Problem: Skin Integrity: Goal: Risk for impaired skin integrity will decrease Outcome: Progressing   Problem: Activity: Goal: Ability to tolerate increased activity will improve Outcome: Progressing   Problem: Clinical Measurements: Goal: Ability to maintain a body temperature in the normal range will improve Outcome: Progressing   Problem: Respiratory: Goal: Ability to maintain adequate ventilation will improve Outcome: Progressing Goal: Ability to maintain a clear airway  will improve Outcome: Progressing   Problem: Education: Goal: Knowledge of General Education information will improve Description: Including pain rating scale, medication(s)/side effects and non-pharmacologic comfort measures Outcome: Progressing   Problem: Health Behavior/Discharge Planning: Goal: Ability to manage health-related needs will improve Outcome: Progressing   Problem: Clinical Measurements: Goal: Ability to maintain clinical measurements within normal limits will improve Outcome: Progressing Goal: Will remain free from infection Outcome: Progressing Goal: Diagnostic test results will improve Outcome: Progressing Goal: Respiratory complications will improve Outcome: Progressing Goal: Cardiovascular complication will be avoided Outcome: Progressing   Problem: Activity: Goal: Risk for activity intolerance will decrease Outcome: Progressing   Problem: Nutrition: Goal: Adequate nutrition will be maintained Outcome: Progressing   Problem: Coping: Goal: Level of anxiety will decrease Outcome: Progressing   Problem: Elimination: Goal: Will not experience complications related to bowel motility Outcome: Progressing Goal: Will not experience complications related to urinary retention Outcome: Progressing   Problem: Pain Managment: Goal: General experience of comfort will improve and/or be controlled Outcome: Progressing   Problem: Safety: Goal: Ability to remain free from injury will improve Outcome: Progressing   Problem: Skin Integrity: Goal: Risk for impaired skin integrity will decrease Outcome: Progressing

## 2023-07-13 NOTE — Hospital Course (Signed)
 89yo with h/o chronic diastolic CHF, COPD, DM, HTN, HLD, CAD, OSA, and stage 3a CKD who presented on 4/13 for weakness, cough.  She was last hospitalized from 3/15-22 with acute hypoxic respiratory failure in the setting of influenza A. She was discharged to SNF rehab and returned home on Monday, 4/6. She became acutely worse several days later.  Concern for LLL PNA (symptoms and abnormal CT although CXR was negative), UTI (urology recommends outpatient f/u), and likely hemorrhoidal bleeding (GI to see).  PT/OT consulted.

## 2023-07-13 NOTE — Progress Notes (Addendum)
 Progress Note   Patient: Danielle [name] ZOX:[id number] DOB: [birth year] DOA: 07/12/2023     0 DOS: the patient was seen and examined on 07/13/2023   Brief hospital course: 89yo with h/o chronic diastolic CHF, COPD, DM, HTN, HLD, CAD, OSA, and stage 3a CKD who presented on 4/13 for weakness, cough.  She was last hospitalized from 3/15-22 with acute hypoxic respiratory failure in the setting of influenza A. She was discharged to SNF rehab and returned home on Monday, 4/6. She became acutely worse several days later.  Concern for LLL PNA (symptoms and abnormal CT although CXR was negative), UTI (urology recommends outpatient f/u), and likely hemorrhoidal bleeding (GI to see).  PT/OT consulted.  Assessment and Plan:  Failure to thrive Patient recently hospitalized, went to SNF rehab, returned home, back within a few days Likely a combination of LLL PNA + gross hematuria with UTI but mostly FTT Also discussed GOC to some extent today; she is withering medically and physically and family is considering transition to comfort care Palliative care consulted  LLL PNA Patient presenting with productive cough, SOB, and infiltrate in left lower lobe on CT She was recently discharged from SNF rehab and so is at risk for HCAP Influenza negative COVID-19 negative Blood cultures are pending Will start Azithromycin 500 mg IV daily and Rocephin for now Will add albuterol PRN Will add standing Duonebs Will add Mucinex for cough Will monitor on telemetry bed She really wants buttermilk and I see no contraindication despite Dr. Annamaria Barrette strictly forbidding it; further, she accepts the associated risks regardless and so will allow her to have it   Gross hematuria Patient reports intermittent gross hematuria since cystoscopy in December She is also having voiding hesitancy UA is not overly suggestive of UTI but given symptoms this is possible Regardless, she is on antibiotics for the PNA and should be  covered for UTI as well Discussed with Dr. Delores Fester, who recommends holding Eliquis and following up outpatient with Dr. Del Favia She is actively being treated with Bactrim BID for UTI, EOT 4/16 (will stop) She also receives Bactrim at bedtime for prophylaxis (will hold)   Hematochezia Likely hemorrhoidal bleeding by description Will hold Eliquis Good bowel regimen GI consulted by ER, giving Analpram  Anemia Likely acute on chronic anemia with acute being ABLA and chronic being chronic disease Continue to trend Transfuse for Hgb <8   Tongue ulceration Uncertain etiology, may need outpatient biopsy She has not had improvement with Nystatin Will add Ambesol for now If not clearing, refer to ENT for biopsy as outpatient  Delirium Possible underlying dementia Will order delirium precautions Stop narcotics for now, as these tend to cause more confusion   Generalized weakness Acute onset after good response to rehab but recent discharge in the setting of probable active infection Will treat medically and monitor response PT/OT consults   Chronic diastolic CHF Appears to be compensated at this time Hold Lasix, aldactone for now in the setting of active infection   COPD On 2L home O2 Continue Breo (Symbicort per formulary)   Afib Rate controlled with amiodarone Hold Eliquis in the setting of active bleeding   DM Recent A1c 7.4, at goal for age Hold Glucophage, Januvia Continue glargine Cover with moderate-scale SSI Carb modified diet  Continue gabapentin for neuropathy   HTN She does not appear to be taking medications for this issue at this time    HLD Continue rosuvastatin   Stage 3a CKD Appears to be stable at  this time Attempt to avoid nephrotoxic medications Recheck BMP in AM    Mood d/o Continue citalopram   Morbid obesity Body mass index is 41.96 kg/m.Marland Kitchen  Weight loss should be encouraged Outpatient PCP/bariatric medicine f/u encouraged Significantly  low or high BMI is associated with higher medical risk including morbidity and mortality    DNR I have discussed code status with the patient/family and they are in agreement that the patient would not desire resuscitation and would prefer to die a natural death should that situation arise. DNR confirmed at the time of admission Patient will need a gold out of facility DNR form at the time of discharge      Consultants: GI Palliative care PT OT  Procedures: None  Antibiotics: Azithromycin x 1 Ceftriaxone 4/13-18 Doxycycline 4/14-18  30 Day Unplanned Readmission Risk Score    Flowsheet Row ED to Hosp-Admission (Discharged) from 06/13/2023 in [facility]  30 Day Unplanned Readmission Risk Score (%) 28.24 Filed at 06/20/2023 1200       This score is the patient's risk of an unplanned readmission within 30 days of being discharged (0 -100%). The score is based on dignosis, age, lab data, medications, orders, and past utilization.   Low:  0-14.9   Medium: 15-21.9   High: 22-29.9   Extreme: 30 and above           Subjective: Remains very fatigued, winded.  Ongoing cough.  She received Norco x 3 overnight and is confused this AM and somnolent - but her daughter reports delirium during prior hospitalizations and recent worsening confusion at home.  Daughter reports that she is difficult to care for at home - sleeps in a recliner, refuses Chucks pads and refuses to wear underwear so soils sheets frequently.  She is experiencing cognitive and physical decline.  She was discharged home from SNF 3 days early last time and so may have a few more days prior to co-pay days.  Family may consider comfort care and would like to speak with palliative care.   Objective: Vitals:   07/13/23 0440 07/13/23 0801  BP: 125/83   Pulse: (!) 54   Resp: 17   Temp: 97.6 F (36.4 C)   SpO2: 100% 98%    Intake/Output Summary (Last 24 hours) at 07/13/2023 1158 Last data filed at  07/13/2023 6045 Gross per 24 hour  Intake 360 ml  Output --  Net 360 ml   Filed Weights   07/12/23 0958 07/12/23 1551  Weight: 111.1 kg 107.5 kg    Exam:  General:  Appears calm and comfortable and is in NAD, somnolent in bedside chair Eyes:  EOMI, normal lids, iris ENT:  grossly normal hearing, lips; left anterior tongue ulceration (isolated); mmm Neck:  no LAD, masses or thyromegaly Cardiovascular:  RRR, no m/r/g. Tr LE edema.  Respiratory:   Scattered rhonchi.  Normal increased respiratory effort.  Rare coarse cough. Abdomen:  soft, NT, ND Skin:  no rash or induration seen on limited exam Musculoskeletal:  grossly normal tone BUE/BLE, no bony abnormality, BLE TTP Psychiatric: somnolent and mildly confused mood and affect, speech sparse today Neurologic:  CN 2-12 grossly intact, moves all extremities in coordinated fashion  Data Reviewed: I have reviewed the patient's lab results since admission.  Pertinent labs for today include:   BMP pending WBC 4.6 Hgb 8.7, 9.7 on 4/13 Sputum culture pending Blood cultures pending    Family Communication: Daughter was present throughout evaluation  Disposition: Status  is: Inpatient Admit - It is my clinical opinion that admission to INPATIENT is reasonable and necessary because of the expectation that this patient will require hospital care that crosses at least 2 midnights to treat this condition based on the medical complexity of the problems presented.  Given the aforementioned information, the predictability of an adverse outcome is felt to be significant.      Time spent: 50 minutes  Unresulted Labs (From admission, onward)     Start     Ordered   07/14/23 0500  CBC with Differential/Platelet  Tomorrow morning,   R        07/13/23 1151   07/14/23 0500  Basic metabolic panel with GFR  Tomorrow morning,   R        07/13/23 1151   07/12/23 1147  Urine Culture  Once,   R        07/12/23 1147              Author: Lorita Rosa, MD 07/13/2023 11:58 AM  For on call review [email or web address].

## 2023-07-13 NOTE — Evaluation (Signed)
 Physical Therapy Evaluation Patient Details Name: Danielle [name] MRN: [id number] DOB: 1933-06-11 Today's Date: 07/13/2023  History of Present Illness  Danielle [name] is a 89 y.o. female is an 89 year old female admitted 07/12/23 with weakness, fall, UTI, left lower lobe pneumonia. PMH: arthritis, depression, hypertension, hyperlipidemia, coronary artery disease, chronic diastolic CHF , atrial fibrillation on Eliquis, DM type II, CKD , COPD, OSA, restless leg syndrome, GERD, colon polyps, recurrent UTIs and kidney stones s/p cystoscopy, ureteroscopy, laser lithotripsy and stent placement.She was recently admitted to the hospital 3/15 - 06/20/2023 with acute hypoxic respiratory failure secondary to influenza A, DC to SNF, DC home 07/06/23.  Clinical Impression  Pt admitted with above diagnosis. . Pt currently with functional limitations due to the deficits listed below (see PT Problem List). Pt will benefit from acute skilled PT to increase their independence and safety with mobility to allow discharge.     The patient reports feeling very weak. Patient's daughter present.   Patient was DC from [facility] 4/7, was ambulating with RW, daughter providing meals and  organized medications. Patient comes in  with progressive weakness and  inability to ambulate safely and care for herself.  Per daughter,  she is unable to stay 24/7 with patient.  Patient will benefit from continued inpatient follow up therapy, <3 hours/day.  SPO2 on 2 L 100%        If plan is discharge home, recommend the following: Two people to help with walking and/or transfers;A lot of help with bathing/dressing/bathroom;Assist for transportation;Help with stairs or ramp for entrance   Can travel by private vehicle   No    Equipment Recommendations None recommended by PT  Recommendations for Other Services       Functional Status Assessment Patient has had a recent decline in their functional status and demonstrates the  ability to make significant improvements in function in a reasonable and predictable amount of time.     Precautions / Restrictions Precautions Precautions: Fall Restrictions Weight Bearing Restrictions Per Provider Order: No      Mobility  Bed Mobility Overal bed mobility: Needs Assistance Bed Mobility: Supine to Sit     Supine to sit: Min assist     General bed mobility comments: extra time to move to sitting, moves legs  to bed edge    Transfers Overall transfer level: Needs assistance Equipment used: Rolling walker (2 wheels) Transfers: Sit to/from Stand, Bed to chair/wheelchair/BSC Sit to Stand: +2 physical assistance, +2 safety/equipment, Mod assist   Step pivot transfers: +2 safety/equipment, +2 physical assistance, Mod assist       General transfer comment: Patient with noted jerking  movements oof legs and trunk and arms when standing, able to step to recliner   with small steps    Ambulation/Gait                  Stairs            Wheelchair Mobility     Tilt Bed    Modified Rankin (Stroke Patients Only)       Balance Overall balance assessment: Needs assistance, History of Falls Sitting-balance support: Bilateral upper extremity supported, Feet supported Sitting balance-Leahy Scale: Fair     Standing balance support: During functional activity, Bilateral upper extremity supported, Reliant on assistive device for balance Standing balance-Leahy Scale: Poor Standing balance comment: reliant on external support and RW  Pertinent Vitals/Pain Pain Assessment Pain Assessment: No/denies pain    Home Living Family/patient expects to be discharged to:: Private residence Living Arrangements: Alone Available Help at Discharge: Family;Available PRN/intermittently Type of Home: House Home Access: Stairs to enter Entrance Stairs-Rails: None Entrance Stairs-Number of Steps: 2     Home Equipment:  Agricultural consultant (2 wheels);Rollator (4 wheels);Cane - single point;BSC/3in1;Wheelchair - manual;Grab bars - toilet;Grab bars - tub/shower;Shower seat Additional Comments: daughter cannot stay 24/7    Prior Function               Mobility Comments: RW for short distances. Pt sleeps in lift recliner.   Family always assists with stairs ADLs Comments: Mod I with ADLs, pt manages breakfast at RW level.  Daughter assists with remainder of meals.  Not driving, daughter assists with medications.  Has life alert     Extremity/Trunk Assessment        Lower Extremity Assessment Lower Extremity Assessment: Generalized weakness    Cervical / Trunk Assessment Cervical / Trunk Assessment: Normal;Other exceptions Cervical / Trunk Exceptions: noted jerking o trunk when sitting up and standing, asterixis like  Communication   Communication Communication: No apparent difficulties Factors Affecting Communication: Reduced clarity of speech    Cognition Arousal: Alert Behavior During Therapy: WFL for tasks assessed/performed   PT - Cognitive impairments: No apparent impairments                         Following commands: Intact       Cueing       General Comments      Exercises     Assessment/Plan    PT Assessment Patient needs continued PT services  PT Problem List Decreased strength;Decreased mobility;Decreased knowledge of precautions;Decreased activity tolerance;Decreased balance       PT Treatment Interventions DME instruction;Therapeutic exercise;Functional mobility training;Therapeutic activities;Patient/family education;Gait training    PT Goals (Current goals can be found in the Care Plan section)  Acute Rehab PT Goals Patient Stated Goal: to go home PT Goal Formulation: With patient/family Time For Goal Achievement: 07/27/23 Potential to Achieve Goals: Good    Frequency Min 3X/week     Co-evaluation PT/OT/SLP Co-Evaluation/Treatment: Yes Reason  for Co-Treatment: For patient/therapist safety;To address functional/ADL transfers PT goals addressed during session: Mobility/safety with mobility OT goals addressed during session: ADL's and self-care       AM-PAC PT "6 Clicks" Mobility  Outcome Measure Help needed turning from your back to your side while in a flat bed without using bedrails?: A Little Help needed moving from lying on your back to sitting on the side of a flat bed without using bedrails?: A Little Help needed moving to and from a bed to a chair (including a wheelchair)?: A Little Help needed standing up from a chair using your arms (e.g., wheelchair or bedside chair)?: A Lot Help needed to walk in hospital room?: Total Help needed climbing 3-5 steps with a railing? : Total 6 Click Score: 13    End of Session Equipment Utilized During Treatment: Gait belt Activity Tolerance: Patient limited by fatigue Patient left: with chair alarm set;with call bell/phone within reach;with family/visitor present Nurse Communication: Mobility status PT Visit Diagnosis: Unsteadiness on feet (R26.81);Muscle weakness (generalized) (M62.81);History of falling (Z91.81);Repeated falls (R29.6);Difficulty in walking, not elsewhere classified (R26.2)    Time: 9147-8295 PT Time Calculation (min) (ACUTE ONLY): 14 min   Charges:   PT Evaluation $PT Eval Low Complexity: 1 Low   PT  General Charges $$ ACUTE PT VISIT: 1 Visit         Abelina Hoes PT Acute Rehabilitation Services Office [phone] Weekend pager-[phone]   Dareen Ebbing 07/13/2023, 11:25 AM

## 2023-07-13 NOTE — Evaluation (Signed)
 Occupational Therapy Evaluation Patient Details Name: Danielle [name] MRN: [id number] DOB: Sep 19, 1933 Today's Date: 07/13/2023   History of Present Illness   Danielle [name] is a 89 y.o. female is an 89 year old female admitted 07/12/23 with weakness, fall, UTI, left lower lobe pneumonia. PMH: arthritis, depression, hypertension, hyperlipidemia, coronary artery disease, chronic diastolic CHF , atrial fibrillation on Eliquis, DM type II, CKD , COPD, OSA, restless leg syndrome, GERD, colon polyps, recurrent UTIs and kidney stones s/p cystoscopy, ureteroscopy, laser lithotripsy and stent placement.She was recently admitted to the hospital 3/15 - 06/20/2023 with acute hypoxic respiratory failure secondary to influenza A, DC to SNF, DC home 07/06/23.     Clinical Impressions Patient is a 89 year old female who was admitted for above. Patient was living at home alone with recent d/c from SNF. Patient was +2 for standing with shakiness noted. Patient's daughter present reporting she is unable to assist with 24/7 caregiver support at home.Patient was noted to have decreased functional activity tolerance, decreased endurance, decreased standing balance, decreased safety awareness, and decreased knowledge of AD/AE impacting participation in ADLs.  Patient will benefit from continued inpatient follow up therapy, <3 hours/day.        If plan is discharge home, recommend the following:   Two people to help with walking and/or transfers;A lot of help with bathing/dressing/bathroom;Assistance with cooking/housework;Assist for transportation;Direct supervision/assist for medications management;Direct supervision/assist for financial management;Help with stairs or ramp for entrance     Functional Status Assessment   Patient has had a recent decline in their functional status and demonstrates the ability to make significant improvements in function in a reasonable and predictable amount of time.      Equipment Recommendations   None recommended by OT      Precautions/Restrictions   Precautions Precautions: Fall Restrictions Weight Bearing Restrictions Per Provider Order: No     Mobility Bed Mobility Overal bed mobility: Needs Assistance Bed Mobility: Supine to Sit     Supine to sit: Min assist     General bed mobility comments: extra time to move to sitting, moves legs  to bed edge            Balance Overall balance assessment: Needs assistance, History of Falls Sitting-balance support: Bilateral upper extremity supported, Feet supported Sitting balance-Leahy Scale: Fair     Standing balance support: During functional activity, Bilateral upper extremity supported, Reliant on assistive device for balance Standing balance-Leahy Scale: Poor Standing balance comment: reliant on external support and RW                           ADL either performed or assessed with clinical judgement   ADL Overall ADL's : Needs assistance/impaired Eating/Feeding: Set up;Sitting   Grooming: Sitting;Brushing hair;Supervision/safety Grooming Details (indicate cue type and reason): sitting on EOB Upper Body Bathing: Sitting;Minimal assistance   Lower Body Bathing: Sitting/lateral leans;Maximal assistance   Upper Body Dressing : Minimal assistance;Sitting   Lower Body Dressing: Sitting/lateral leans;Maximal assistance Lower Body Dressing Details (indicate cue type and reason): uanbl to figure four feet. Toilet Transfer: +2 for physical assistance;+2 for safety/equipment;Moderate assistance;Ambulation;Rolling walker (2 wheels) Toilet Transfer Details (indicate cue type and reason): to take steps with shakiness noted with standing and transfers with RW. Toileting- Clothing Manipulation and Hygiene: Bed level;Total assistance               Vision   Vision Assessment?: No apparent visual deficits  Pertinent Vitals/Pain Pain Assessment Pain  Assessment: No/denies pain     Extremity/Trunk Assessment Upper Extremity Assessment Upper Extremity Assessment: Generalized weakness (full ROM for combing hair.)   Lower Extremity Assessment Lower Extremity Assessment: Defer to PT evaluation   Cervical / Trunk Assessment Cervical / Trunk Assessment: Normal;Other exceptions Cervical / Trunk Exceptions: noted jerking of trunk when sitting up and standing, asterixis like   Communication Communication Communication: No apparent difficulties Factors Affecting Communication: Reduced clarity of speech   Cognition Arousal: Alert Behavior During Therapy: WFL for tasks assessed/performed Cognition: Difficult to assess             OT - Cognition Comments: patient easily fatigued.                 Following commands: Intact                  Home Living Family/patient expects to be discharged to:: Private residence Living Arrangements: Alone Available Help at Discharge: Family;Available PRN/intermittently Type of Home: House Home Access: Stairs to enter [employer] of Steps: 2 Entrance Stairs-Rails: None Home Layout: One level     Bathroom Shower/Tub: Producer, television/film/video: Standard Bathroom Accessibility: Yes   Home Equipment: Agricultural consultant (2 wheels);Rollator (4 wheels);Cane - single point;BSC/3in1;Wheelchair - manual;Grab bars - toilet;Grab bars - tub/shower;Shower seat   Additional Comments: daughter cannot stay 24/7      Prior Functioning/Environment Prior Level of Function : History of Falls (last six months);Needs assist       Physical Assist : Mobility (physical);ADLs (physical) Mobility (physical): Transfers;Stairs ADLs (physical): IADLs Mobility Comments: RW for short distances. Pt sleeps in lift recliner.   Family always assists with stairs ADLs Comments: Mod I with ADLs, pt manages breakfast at RW level.  Daughter assists with remainder of meals.  Not driving, daughter  assists with medications.    OT Problem List: Impaired balance (sitting and/or standing);Decreased knowledge of precautions;Decreased safety awareness;Cardiopulmonary status limiting activity;Decreased activity tolerance;Decreased knowledge of use of DME or AE   OT Treatment/Interventions: Self-care/ADL training;DME and/or AE instruction;Therapeutic activities;Balance training;Therapeutic exercise;Energy conservation;Patient/family education      OT Goals(Current goals can be found in the care plan section)   Acute Rehab OT Goals Patient Stated Goal: to get home OT Goal Formulation: With patient/family Time For Goal Achievement: 07/27/23 Potential to Achieve Goals: Fair   OT Frequency:  Min 2X/week    Co-evaluation   Reason for Co-Treatment: For patient/therapist safety;To address functional/ADL transfers PT goals addressed during session: Mobility/safety with mobility OT goals addressed during session: ADL's and self-care      AM-PAC OT "6 Clicks" Daily Activity     Outcome Measure Help from another person eating meals?: A Little Help from another person taking care of personal grooming?: A Little Help from another person toileting, which includes using toliet, bedpan, or urinal?: Total Help from another person bathing (including washing, rinsing, drying)?: A Lot Help from another person to put on and taking off regular upper body clothing?: A Little Help from another person to put on and taking off regular lower body clothing?: A Lot 6 Click Score: 14   End of Session Equipment Utilized During Treatment: Gait belt;Rolling walker (2 wheels) Nurse Communication: Mobility status  Activity Tolerance: Patient tolerated treatment well Patient left: in chair;with call bell/phone within reach;with chair alarm set  OT Visit Diagnosis: Unsteadiness on feet (R26.81);Other abnormalities of gait and mobility (R26.89)  Time: 9147-8295 OT Time Calculation (min): 15  min Charges:  OT General Charges $OT Visit: 1 Visit OT Evaluation $OT Eval Low Complexity: 1 Low  Abimelec Grochowski OTR/L, MS Acute Rehabilitation Department Office# [phone]   Jame Maze 07/13/2023, 12:22 PM

## 2023-07-14 ENCOUNTER — Other Ambulatory Visit: Payer: Self-pay

## 2023-07-14 DIAGNOSIS — R627 Adult failure to thrive: Secondary | ICD-10-CM | POA: Diagnosis not present

## 2023-07-14 DIAGNOSIS — Z7189 Other specified counseling: Secondary | ICD-10-CM

## 2023-07-14 DIAGNOSIS — J189 Pneumonia, unspecified organism: Secondary | ICD-10-CM | POA: Diagnosis not present

## 2023-07-14 DIAGNOSIS — Z515 Encounter for palliative care: Secondary | ICD-10-CM | POA: Diagnosis not present

## 2023-07-14 DIAGNOSIS — K922 Gastrointestinal hemorrhage, unspecified: Secondary | ICD-10-CM | POA: Diagnosis not present

## 2023-07-14 DIAGNOSIS — D509 Iron deficiency anemia, unspecified: Secondary | ICD-10-CM

## 2023-07-14 LAB — BASIC METABOLIC PANEL WITH GFR
Anion gap: 7 (ref 5–15)
BUN: 24 mg/dL — ABNORMAL HIGH (ref 8–23)
CO2: 23 mmol/L (ref 22–32)
Calcium: 8.6 mg/dL — ABNORMAL LOW (ref 8.9–10.3)
Chloride: 104 mmol/L (ref 98–111)
Creatinine, Ser: 0.9 mg/dL (ref 0.44–1.00)
GFR, Estimated: >60 mL/min (ref >60)
Glucose, Bld: 192 mg/dL — ABNORMAL HIGH (ref 70–99)
Potassium: 4.3 mmol/L (ref 3.5–5.1)
Sodium: 134 mmol/L — ABNORMAL LOW (ref 135–145)

## 2023-07-14 LAB — CBC WITH DIFFERENTIAL/PLATELET
Abs Immature Granulocytes: 0.02 10*3/uL (ref 0.00–0.07)
Basophils Absolute: 0.1 10*3/uL (ref 0.0–0.1)
Basophils Relative: 1 %
Eosinophils Absolute: 0.2 10*3/uL (ref 0.0–0.5)
Eosinophils Relative: 4 %
HCT: 29.2 % — ABNORMAL LOW (ref 36.0–46.0)
Hemoglobin: 8.6 g/dL — ABNORMAL LOW (ref 12.0–15.0)
Immature Granulocytes: 0 %
Lymphocytes Relative: 27 %
Lymphs Abs: 1.3 10*3/uL (ref 0.7–4.0)
MCH: 29.4 pg (ref 26.0–34.0)
MCHC: 29.5 g/dL — ABNORMAL LOW (ref 30.0–36.0)
MCV: 99.7 fL (ref 80.0–100.0)
Monocytes Absolute: 0.5 10*3/uL (ref 0.1–1.0)
Monocytes Relative: 10 %
Neutro Abs: 2.8 10*3/uL (ref 1.7–7.7)
Neutrophils Relative %: 58 %
Platelets: 171 10*3/uL (ref 150–400)
RBC: 2.93 MIL/uL — ABNORMAL LOW (ref 3.87–5.11)
RDW: 15 % (ref 11.5–15.5)
WBC: 4.9 10*3/uL (ref 4.0–10.5)
nRBC: 0 % (ref 0.0–0.2)

## 2023-07-14 MED ORDER — FUROSEMIDE 40 MG PO TABS
40.0000 mg | ORAL_TABLET | Freq: Every day | ORAL | Status: DC
  Administered 2023-07-14 – 2023-07-16 (×3): 40 mg via ORAL
  Filled 2023-07-14 (×3): qty 1

## 2023-07-14 MED ORDER — POTASSIUM CHLORIDE CRYS ER 10 MEQ PO TBCR
10.0000 meq | EXTENDED_RELEASE_TABLET | Freq: Every day | ORAL | Status: DC
  Administered 2023-07-14 – 2023-07-16 (×3): 10 meq via ORAL
  Filled 2023-07-14 (×3): qty 1

## 2023-07-14 NOTE — Telephone Encounter (Signed)
 OV has been scheduled for 10/14/23 at 3:00 pm with Dr. Venice Gillis & labs ordered. Will contact patient once discharged from [facility].

## 2023-07-14 NOTE — TOC Initial Note (Signed)
 Transition of Care [facility] - Initial/Assessment Note    Patient Details  Name: Danielle [name] MRN: [id number] Date of Birth: [birth year]  Transition of Care [facility]) CM/SW Contact:    Beckie Busing, RN Phone Number:[phone]  07/14/2023, 12:59 PM  Clinical Narrative:                 [facility] acknowledges consult for home with hospice. Cm at bedside with patient and daughter. Daughter states that she is agreeable to home with hospice and would like referral for [facility] [place] because daughter lives in [place]. Daughter states it will take a day or two to get everything set up at her home. Hospice referral has been called to Cherrie at [facility] [place]. TOC will continue to follow for disposition planning.     Barriers to Discharge: Continued Medical Work up   Patient Goals and CMS Choice Patient states their goals for this hospitalization and ongoing recovery are:: Wants to go home CMS Medicare.gov Compare Post Acute Care list provided to:: Patient Represenative (must comment) (daughter [name]) Choice offered to / list presented to : Adult Children, Patient [place] ownership interest in [facility].provided to::  (n/a)    Expected Discharge Plan and Services In-house Referral: NA Discharge Planning Services: CM Consult Post Acute Care Choice: Hospice Living arrangements for the past 2 months: Single Family Home                 DME Arranged: N/A (DME will be arranged by hospice if needed)         HH Arranged: NA HH Agency: NA        Prior Living Arrangements/Services Living arrangements for the past 2 months: Single Family Home Lives with:: Self Patient language and need for interpreter reviewed:: Yes        Need for Family Participation in Patient Care: Yes (Comment) Care giver support system in place?: Yes (comment) Current home services: Home OT, Home PT (Current HH with [facility]) Criminal Activity/Legal Involvement Pertinent  to Current Situation/Hospitalization: No - Comment as needed  Activities of Daily Living   ADL Screening (condition at time of admission) Independently performs ADLs?: No Does the patient have a NEW difficulty with bathing/dressing/toileting/self-feeding that is expected to last >3 days?: No Does the patient have a NEW difficulty with getting in/out of bed, walking, or climbing stairs that is expected to last >3 days?: No Does the patient have a NEW difficulty with communication that is expected to last >3 days?: No Is the patient deaf or have difficulty hearing?: No Does the patient have difficulty seeing, even when wearing glasses/contacts?: No Does the patient have difficulty concentrating, remembering, or making decisions?: No  Permission Sought/Granted Permission sought to share information with : Family Supports Permission granted to share information with : Yes, Verbal Permission Granted  Share Information with NAME: [name]     Permission granted to share info w Relationship: daughter  Permission granted to share info w Contact Information: [phone]  Emotional Assessment Appearance:: Appears stated age Attitude/Demeanor/Rapport: Gracious Affect (typically observed): Pleasant Orientation: : Oriented to Self, Oriented to Place, Oriented to Situation, Oriented to  Time Alcohol / Substance Use: Not Applicable Psych Involvement: No (comment)  Admission diagnosis:  Acute lower GI bleeding [K92.2] Failure to thrive in adult [R62.7] Patient Active Problem List   Diagnosis Date Noted   Failure to thrive in adult 07/13/2023   Acute lower GI bleeding 07/12/2023   Tongue ulceration  07/12/2023   Generalized weakness 07/12/2023   (HFpEF) heart failure with preserved ejection fraction (HCC) 06/16/2023   Chronic health problem 06/14/2023   Recurrent gross hematuria 06/14/2023   Influenza 06/13/2023   Influenza A 06/13/2023   Acute on chronic respiratory failure with  hypoxemia (HCC)  Influenza A 06/13/2023   Type 2 diabetes mellitus (HCC) 06/13/2023   Hepatitis A 06/13/2023   Breast pain, right 06/13/2023   Acute deep vein thrombosis (DVT) of right lower extremity (HCC) 02/19/2023   Nephrolithiasis 02/14/2023   Peripheral polyneuropathy 09/10/2022   Claudication (HCC) 09/10/2022   Subclinical hypothyroidism 03/11/2022   Atrial fibrillation (HCC) 02/28/2022   Paroxysmal atrial fibrillation (HCC) 02/27/2022   Acute on chronic diastolic CHF (congestive heart failure) (HCC) 02/27/2022   Multifocal atrial tachycardia (HCC) 02/26/2022   Coronary artery disease    PONV (postoperative nausea and vomiting)    Osteoporosis    Insomnia    Hypertension    Hyperlipidemia    History of hiatal hernia    Headache    GERD (gastroesophageal reflux disease)    Emphysema lung (HCC)    COPD (chronic obstructive pulmonary disease) (HCC)    CHF (congestive heart failure) (HCC)    Asthma    Arthritis    Anginal pain (HCC)    Callus 05/30/2020   Flat feet, bilateral 05/30/2020   Hammer toe of left foot 05/30/2020   OSA (obstructive sleep apnea) 12/16/2019   Internal hemorrhoids 11/26/2018   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/26/2018   Unstable angina (HCC) 10/30/2018   Positive D-dimer 09/08/2018   DOE (dyspnea on exertion) 09/07/2018   Class 3 obesity 09/07/2018   Edema 08/12/2018   Iron deficiency anemia 08/12/2018   Senile purpura (HCC) 08/12/2018   Strain of right shoulder 08/12/2018   Dysphagia 08/12/2018   Chronic pain of both knees 05/14/2018   Stasis edema of left lower extremity 04/13/2018   Vitamin B12 deficiency 04/13/2018   Restless legs syndrome 03/04/2018   At high risk for falls 11/11/2017   Gastroesophageal reflux disease without esophagitis 11/11/2017   Stage 3a chronic kidney disease (HCC) 05/07/2017   Chronic respiratory failure with hypoxia (HCC) 05/05/2017   Vitamin D deficiency 02/27/2016   Nocturnal hypoxemia due to emphysema (HCC) 12/04/2015    Midsternal chest pain 11/11/2015   Chronic diastolic congestive heart failure (HCC) 11/11/2015   Chest pain 11/09/2015   Depression 11/09/2015   Panlobular emphysema (HCC) 11/09/2015   Elevated blood sugar 11/09/2015   Chronic insomnia 10/24/2015   Type 2 diabetes mellitus with hyperlipidemia (HCC) 08/14/2015   Primary osteoarthritis involving multiple joints 08/14/2015   Malaise and fatigue 08/14/2015   Degenerative lumbar disc 08/14/2015   Abnormal gait 08/14/2015   Mesenteric mass 02/19/2015   Coronary artery disease involving native coronary artery of native heart without angina pectoris 11/14/2014   Mixed hyperlipidemia 11/14/2014   Current mild episode of major depressive disorder (HCC) 11/14/2014   Left lower lobe pneumonia 2013   PCP:  Gordan Payment., MD Pharmacy:   [facility] [address] [place], [place] - [address] [address] [place] [address] Phone: [phone] Fax: [phone]  [facility] [address] [place] [address] Phone: [phone] Fax: [phone]     Social Drivers of Health (SDOH) Social History: SDOH Screenings   Food Insecurity: No Food Insecurity (07/12/2023)  Housing: Low Risk  (07/12/2023)  Transportation Needs: No Transportation Needs (07/12/2023)  Utilities: Not At Risk (  07/12/2023)  Social Connections: Moderately Isolated (07/12/2023)  Tobacco Use: Medium Risk (07/12/2023)   SDOH Interventions:     Readmission Risk Interventions    07/14/2023   12:45 PM 06/15/2023   10:32 AM  Readmission Risk Prevention Plan  Transportation Screening Complete Complete  PCP or Specialist Appt within 3-5 Days  Complete  HRI or Home Care Consult  Complete  Social Work Consult for Recovery Care Planning/Counseling  Complete  Palliative Care Screening  Not Applicable  Medication Review Oceanographer) Complete Complete  PCP or Specialist appointment within 3-5 days of discharge Complete    HRI or Home Care Consult Complete   SW Recovery Care/Counseling Consult Complete   Palliative Care Screening Complete   Skilled Nursing Facility Patient Refused

## 2023-07-14 NOTE — Progress Notes (Signed)
 Patient refusing IV and prefers to take oral meds.

## 2023-07-14 NOTE — Plan of Care (Signed)
  Problem: Education: Goal: Knowledge of General Education information will improve Description: Including pain rating scale, medication(s)/side effects and non-pharmacologic comfort measures Outcome: Progressing   Problem: Health Behavior/Discharge Planning: Goal: Ability to manage health-related needs will improve Outcome: Progressing   Problem: Clinical Measurements: Goal: Ability to maintain clinical measurements within normal limits will improve Outcome: Progressing Goal: Will remain free from infection Outcome: Progressing Goal: Diagnostic test results will improve Outcome: Progressing Goal: Respiratory complications will improve Outcome: Progressing Goal: Cardiovascular complication will be avoided Outcome: Progressing   Problem: Activity: Goal: Risk for activity intolerance will decrease Outcome: Progressing   Problem: Nutrition: Goal: Adequate nutrition will be maintained Outcome: Progressing   Problem: Coping: Goal: Level of anxiety will decrease Outcome: Progressing   Problem: Elimination: Goal: Will not experience complications related to bowel motility Outcome: Progressing Goal: Will not experience complications related to urinary retention Outcome: Progressing   Problem: Pain Managment: Goal: General experience of comfort will improve and/or be controlled Outcome: Progressing   Problem: Safety: Goal: Ability to remain free from injury will improve Outcome: Progressing   Problem: Skin Integrity: Goal: Risk for impaired skin integrity will decrease Outcome: Progressing   Problem: Activity: Goal: Ability to tolerate increased activity will improve Outcome: Progressing   Problem: Clinical Measurements: Goal: Ability to maintain a body temperature in the normal range will improve Outcome: Progressing   Problem: Respiratory: Goal: Ability to maintain adequate ventilation will improve Outcome: Progressing Goal: Ability to maintain a clear airway  will improve Outcome: Progressing   Problem: Education: Goal: Knowledge of General Education information will improve Description: Including pain rating scale, medication(s)/side effects and non-pharmacologic comfort measures Outcome: Progressing   Problem: Health Behavior/Discharge Planning: Goal: Ability to manage health-related needs will improve Outcome: Progressing   Problem: Clinical Measurements: Goal: Ability to maintain clinical measurements within normal limits will improve Outcome: Progressing Goal: Will remain free from infection Outcome: Progressing Goal: Diagnostic test results will improve Outcome: Progressing Goal: Respiratory complications will improve Outcome: Progressing Goal: Cardiovascular complication will be avoided Outcome: Progressing   Problem: Activity: Goal: Risk for activity intolerance will decrease Outcome: Progressing   Problem: Nutrition: Goal: Adequate nutrition will be maintained Outcome: Progressing   Problem: Coping: Goal: Level of anxiety will decrease Outcome: Progressing   Problem: Elimination: Goal: Will not experience complications related to bowel motility Outcome: Progressing Goal: Will not experience complications related to urinary retention Outcome: Progressing   Problem: Pain Managment: Goal: General experience of comfort will improve and/or be controlled Outcome: Progressing   Problem: Safety: Goal: Ability to remain free from injury will improve Outcome: Progressing   Problem: Skin Integrity: Goal: Risk for impaired skin integrity will decrease Outcome: Progressing

## 2023-07-14 NOTE — Progress Notes (Addendum)
 Progress Note   Patient: Danielle [name] ZOX:[id number] DOB: [birth year] DOA: 07/12/2023     1 DOS: the patient was seen and examined on 07/14/2023   Brief hospital course: 89yo with h/o chronic diastolic CHF, COPD, DM, HTN, HLD, CAD, OSA, and stage 3a CKD who presented on 4/13 for weakness, cough.  She was last hospitalized from 3/15-22 with acute hypoxic respiratory failure in the setting of influenza A. She was discharged to SNF rehab and returned home on Monday, 4/6. She became acutely worse several days later.  Concern for LLL PNA (symptoms and abnormal CT although CXR was negative), UTI (urology recommends outpatient f/u), and likely hemorrhoidal bleeding (GI to see).  PT/OT consulted.  Assessment and Plan:  Failure to thrive Patient recently hospitalized, went to SNF rehab, returned home, back within a few days Likely a combination of LLL PNA + gross hematuria with UTI but mostly FTT With ongoing GOC conversation, she is likely to discharge home with hospice soon as she is withering medically and physically Palliative care consulting While the patient really wants to go to her house, for now the plan is likely to dc to her daughter's house since the patient is unable to care for herself and lives alone   LLL PNA Patient presenting with productive cough, SOB, and infiltrate in left lower lobe on CT She was recently discharged from SNF rehab and so is at risk for HCAP Influenza negative COVID-19 negative Blood cultures are pending Will treat with Doxycycline and Rocephin for 5 days Will add albuterol PRN Will add Mucinex for cough She really wants to drink buttermilk and she accepts the associated risks regardless and so will allow her to have it (pulmonologist Dr. Su Monks previously banned it)   Gross hematuria Patient reports intermittent gross hematuria since cystoscopy in December She is also having voiding hesitancy UA is not overly suggestive of UTI but given symptoms this  is possible Regardless, she is on antibiotics for the PNA and should be covered for UTI as well Discussed with Dr. Donn Pierini, who recommends holding Eliquis and following up outpatient with Dr. Margo Aye   Hematochezia Likely hemorrhoidal bleeding by description - unrelated to Eliquis Will hold Eliquis, consider discontinuation given plan for home with hospice Good bowel regimen GI consulted by ER, giving Analpram   Anemia Likely acute on chronic anemia with acute being ABLA and chronic being chronic disease No further evaluation/treatment as of now   Tongue ulceration Uncertain etiology, may need outpatient biopsy She has not had improvement with Nystatin Added Ambesol   Delirium Possible underlying dementia Will order delirium precautions Stop narcotics for now, as these tend to cause more confusion   Generalized weakness Acute onset after good response to rehab but recent discharge in the setting of probable active infection PT/OT consulted Appears unlikely to have a robust response to ongoing treatment and so family is considering comfort care   Chronic diastolic CHF Appears to be compensated at this time Resumed Lasix today (40 mg daily instead of 60) at her daughter's request   COPD with chronic respiratory failure On 2L home O2 Continue Breo (Symbicort per formulary)   Afib Rate controlled with amiodarone Hold Eliquis in the setting of active bleeding   DM Recent A1c 7.4, at goal for age Hold Glucophage, Januvia Continue glargine Cover with moderate-scale SSI Carb modified diet  Continue gabapentin for neuropathy   HTN She does not appear to be taking medications for this issue at this time  HLD Continue rosuvastatin   Stage 3a CKD Appears to be stable at this time Attempt to avoid nephrotoxic medications   Mood d/o Continue citalopram   Morbid obesity Body mass index is 41.96 kg/m.Marland Kitchen  Weight loss should be encouraged Outpatient PCP/bariatric  medicine f/u encouraged Significantly low or high BMI is associated with higher medical risk including morbidity and mortality    DNR I have discussed code status with the patient/family and they are in agreement that the patient would not desire resuscitation and would prefer to die a natural death should that situation arise. DNR confirmed at the time of admission Patient will need a gold out of facility DNR form at the time of discharge  Plan is likely to be home with hospice to her daughter's house       Consultants: GI Palliative care PT OT   Procedures: None   Antibiotics: Azithromycin x 1 Ceftriaxone 4/13-18 Doxycycline 4/14-18  30 Day Unplanned Readmission Risk Score    Flowsheet Row ED to Hosp-Admission (Current) from 07/12/2023 in  6 [facility]  30 Day Unplanned Readmission Risk Score (%) 31.03 Filed at 07/14/2023 0801       This score is the patient's risk of an unplanned readmission within 30 days of being discharged (0 -100%). The score is based on dignosis, age, lab data, medications, orders, and past utilization.   Low:  0-14.9   Medium: 15-21.9   High: 22-29.9   Extreme: 30 and above           Subjective: Understands that she is unlikely to have meaningful improvement.  In that case, she would prefer to go home and die at home with hospice.  She was very awake and aware of the circumstances today, having just spoken with Dr. Linna Darner.   Objective: Vitals:   07/13/23 2007 07/14/23 0454  BP: 125/86 (!) 143/63  Pulse: (!) 55 64  Resp: 18 18  Temp: 98 F (36.7 C) 98.1 F (36.7 C)  SpO2: 97% 97%    Intake/Output Summary (Last 24 hours) at 07/14/2023 1417 Last data filed at 07/14/2023 0900 Gross per 24 hour  Intake 1132.89 ml  Output 400 ml  Net 732.89 ml   Filed Weights   07/12/23 0958 07/12/23 1551  Weight: 111.1 kg 107.5 kg    Exam:  General:  Appears calm and comfortable and is in NAD, sitting on bedside commode Eyes:  EOMI,  normal lids, iris ENT:  grossly normal hearing, lips; left anterior tongue ulceration (isolated); mmm Neck:  no LAD, masses or thyromegaly Cardiovascular:  RRR, no m/r/g. Tr LE edema.  Respiratory:   CTAB.  Normal increased respiratory effort. [place] O2 in place. Abdomen:  soft, NT, ND Skin:  no rash or induration seen on limited exam Musculoskeletal:  grossly normal tone BUE/BLE, no bony abnormality, BLE TTP Psychiatric: grossly normal mood and affect, speech fluent and appropriate, A&O x 3 Neurologic:  CN 2-12 grossly intact, moves all extremities in coordinated fashion  Data Reviewed: I have reviewed the patient's lab results since admission.  Pertinent labs for today include:   Glucose 192 WBC 4.9 Hgb 8.6     Family Communication: Daughter was present throughout evaluation  Disposition: Status is: Inpatient Remains inpatient appropriate because: discharge planning is ongoing     Time spent: 50 minutes  Unresulted Labs (From admission, onward)    None        Author: Jonah Blue, MD 07/14/2023 2:17 PM  For on call review [email or web address].

## 2023-07-14 NOTE — Consult Note (Signed)
 Consultation Note Date: 07/14/2023   Patient Name: Danielle [name]  DOB: 03/30/1934  MRN: [id number]  Age / Sex: 89 y.o., female  PCP: Abbe Hoard., MD Referring Physician: Lorita Rosa, MD  Reason for Consultation: Establishing goals of care  HPI/Patient Profile: 89 y.o. female admitted on 07/12/2023   Clinical Assessment and Goals of Care: 89 year old lady who lives alone, daughter lives nearby, is currently in [place] .  Has chronic diastolic congestive heart failure COPD diabetes hypertension dyslipidemia coronary artery disease obstructive sleep apnea stage III CKD. Patient has had gradual progressive functional decline over the course of the past several months.  Currently admitted to hospital medicine service for weakness and cough.  Was recently hospitalized in March 2025 for acute hypoxic respiratory failure in the setting of influenza A and was discharged to SNF for rehab and returned home on Monday, April 6.  Subsequently became acutely worse and brought back to the hospital and found to have concern for left lower lobe pneumonia and a urinary tract infection as well as possibly hemorrhoidal bleeding and chronic anemia. Goals of care discussions were requested and a palliative consult was requested. Chart reviewed, patient seen and examined.  Subsequently arrived again and discussed with the daughter as well as grandson. Palliative medicine is specialized medical care for people living with serious illness. It focuses on providing relief from the symptoms and stress of a serious illness. The goal is to improve quality of life for both the patient and the family. Goals of care: Broad aims of medical therapy in relation to the patient's values and preferences. Our aim is to provide medical care aimed at enabling patients to achieve the goals that matter most to them, given the  circumstances of their particular medical situation and their constraints.  We discussed about the serious irreversible nature of the patient's overall condition.  We discussed about anticipating symptoms of from CHF COPD standpoint as well as CKD standpoint.  We discussed about how hospice services can help at this juncture.  Explored addition of hospice services in great detail.  Explained about hospice to the best of my ability.  NEXT OF KIN Daughter Danielle Ala.  SUMMARY OF RECOMMENDATIONS   Goals of care discussions undertaken with the patient.  Subsequently returned to bedside after patient's daughter Danielle Ala arrived as well.  On the phone, patient's grandson Danielle [name] also participated in repeat goals of care discussions. Patient has been more fatigued, has been more short of breath, has had more lower extremity swelling.  She has had gradual progressive functional decline essentially since the last few months.  We discussed about scope of hospice services.  Home with hospice services were discussed extensively.  Patient and family considering discharge home with hospice services towards the end of this hospitalization.  All of their questions addressed to the best of my ability.  Will reach out to [facility] for their assistance in collaborating with patient and family with regards to setting up home with hospice services. Thank you for  the consult.  Code Status/Advance Care Planning: DNR   Symptom Management:     Palliative Prophylaxis:  Delirium Protocol  Additional Recommendations (Limitations, Scope, Preferences): Avoid Hospitalization  Psycho-social/Spiritual:  Desire for further Chaplaincy support:yes Additional Recommendations: Education on Hospice  Prognosis:  < 6 months  Discharge Planning: Home with Hospice      Primary Diagnoses: Present on Admission:  Acute lower GI bleeding  Left lower lobe pneumonia  Recurrent gross hematuria  Tongue ulceration  Type  2 diabetes mellitus with hyperlipidemia (HCC)  COPD (chronic obstructive pulmonary disease) (HCC)  Class 3 obesity  Chronic diastolic congestive heart failure (HCC)  Atrial fibrillation (HCC)  Stage 3a chronic kidney disease (HCC)  Failure to thrive in adult   I have reviewed the medical record, interviewed the patient and family, and examined the patient. The following aspects are pertinent.  Past Medical History:  Diagnosis Date   Anginal pain (HCC)    Arthritis    Asthma    At high risk for falls 11/11/2017   CHF (congestive heart failure) (HCC)    Chronic diastolic congestive heart failure (HCC) 11/11/2015   Chronic insomnia 10/24/2015   Chronic pain of both knees 05/14/2018   Chronic respiratory failure with hypoxia (HCC) 05/05/2017   CKD (chronic kidney disease) stage 3, GFR 30-59 ml/min (HCC) 05/07/2017   COPD (chronic obstructive pulmonary disease) (HCC)    Coronary artery disease    Coronary artery disease involving native coronary artery of native heart without angina pectoris 11/14/2014   Overview:  40% RCA a cardiac catheter in 2014   Current mild episode of major depressive disorder (HCC) 11/14/2014   Depression    DOE (dyspnea on exertion) 09/07/2018   Dysphagia 08/12/2018   Edema 08/12/2018   Elevated blood sugar 11/09/2015   Emphysema lung (HCC)    Flat feet, bilateral 05/30/2020   Gastroesophageal reflux disease without esophagitis 11/11/2017   GERD (gastroesophageal reflux disease)    Headache    History of hiatal hernia    History of kidney stones    Hyperlipidemia    Hypertension    Insomnia    Internal hemorrhoids 11/26/2018   Iron deficiency anemia 08/12/2018   Malaise and fatigue 08/14/2015   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/26/2018   Mesenteric mass 02/19/2015   Midsternal chest pain 11/11/2015   Mixed hyperlipidemia 11/14/2014   Morbid obesity (HCC) 09/07/2018   OSA (obstructive sleep apnea) 12/16/2019   Osteoporosis    Panlobular emphysema (HCC)  11/09/2015   Pneumonia 2013   PONV (postoperative nausea and vomiting)    Positive D-dimer 09/08/2018   Primary osteoarthritis involving multiple joints 08/14/2015   Restless legs syndrome 03/04/2018   Senile purpura (HCC) 08/12/2018   Shortness of breath dyspnea    with exertion   Stasis edema of left lower extremity 04/13/2018   Strain of right shoulder 08/12/2018   Type 2 diabetes mellitus with stage 3 chronic kidney disease (HCC) 08/14/2015   Unstable angina (HCC) 10/30/2018   Vitamin B12 deficiency 04/13/2018   Vitamin D deficiency 02/27/2016   Social History   Socioeconomic History   Marital status: Married    Spouse name: Not on file   Number of children: Not on file   Years of education: Not on file   Highest education level: Not on file  Occupational History   Not on file  Tobacco Use   Smoking status: Former    Current packs/day: 0.00    Types: Cigarettes    Quit date: 08/21/2016  Years since quitting: 6.8   Smokeless tobacco: Never  Vaping Use   Vaping status: Never Used  Substance and Sexual Activity   Alcohol use: No   Drug use: No   Sexual activity: Not Currently  Other Topics Concern   Not on file  Social History Narrative   Not on file   Social Drivers of Health   Financial Resource Strain: Not on file  Food Insecurity: No Food Insecurity (07/12/2023)   Hunger Vital Sign    Worried About Running Out of Food in the Last Year: Never true    Ran Out of Food in the Last Year: Never true  Transportation Needs: No Transportation Needs (07/12/2023)   PRAPARE - Administrator, Civil Service (Medical): No    Lack of Transportation (Non-Medical): No  Physical Activity: Not on file  Stress: Not on file  Social Connections: Moderately Isolated (07/12/2023)   Social Connection and Isolation Panel [NHANES]    Frequency of Communication with Friends and Family: Once a week    Frequency of Social Gatherings with Friends and Family: More than three  times a week    Attends Religious Services: Never    Database administrator or Organizations: Yes    Attends Banker Meetings: Never    Marital Status: Widowed   Family History  Problem Relation Age of Onset   Heart attack Mother    Scheduled Meds:  amiodarone  200 mg Oral Daily   citalopram  20 mg Oral Daily   diclofenac Sodium  2 g Topical QID   ferrous sulfate  325 mg Oral Q breakfast   fluticasone furoate-vilanterol  1 puff Inhalation Daily   gabapentin  100 mg Oral QHS   guaiFENesin  5 mL Oral Once   hydrocortisone-pramoxine   Rectal BID   insulin glargine-yfgn  10 Units Subcutaneous Daily   lactobacillus  1 g Oral Q breakfast   loratadine  10 mg Oral Daily   nystatin   Topical BID   pantoprazole  40 mg Oral BID   polyethylene glycol  17 g Oral Daily   rOPINIRole  0.25 mg Oral QHS   rosuvastatin  40 mg Oral Daily   senna  2 tablet Oral QHS   sodium chloride flush  3 mL Intravenous Q12H   Continuous Infusions:  cefTRIAXone (ROCEPHIN)  IV 2 g (07/14/23 0854)   doxycycline (VIBRAMYCIN) IV 100 mg (07/14/23 0856)   PRN Meds:.acetaminophen **OR** acetaminophen, albuterol, benzocaine, bisacodyl, guaiFENesin, hydrALAZINE, melatonin, ondansetron **OR** ondansetron (ZOFRAN) IV, polyvinyl alcohol Medications Prior to Admission:  Prior to Admission medications   Medication Sig Start Date End Date Taking? Authorizing Provider  acetaminophen (TYLENOL) 500 MG tablet Take 500 mg by mouth as needed for mild pain (pain score 1-3) or headache.   Yes [provider]  albuterol (PROVENTIL HFA;VENTOLIN HFA) 108 (90 BASE) MCG/ACT inhaler Inhale 2 puffs into the lungs every 6 (six) hours as needed for wheezing or shortness of breath.    Yes [provider]  amiodarone (PACERONE) 200 MG tablet Take 1 tablet (200 mg total) by mouth daily. 03/05/23  Yes Georgeanna Lea, MD  apixaban (ELIQUIS) 2.5 MG TABS tablet Take 1 tablet (2.5 mg total) by mouth 2 (two) times  daily. 06/19/23  Yes Tiffany Kocher, DO  budesonide-formoterol (SYMBICORT) 160-4.5 MCG/ACT inhaler Inhale 2 puffs into the lungs 2 (two) times daily.   Yes [provider]  carboxymethylcellulose (REFRESH PLUS) 0.5 % SOLN Place 1 drop  into both eyes daily as needed (dry eyes).   Yes [provider]  citalopram (CELEXA) 20 MG tablet Take 20 mg by mouth daily. 10/12/16  Yes [provider]  CRANBERRY PO Take 1 tablet by mouth daily.   Yes [provider]  diclofenac Sodium (VOLTAREN) 1 % GEL APPLY 2 GRAMS TOPICALLY TO AFFECTED AREA THREE TIMES DAILY 08/08/19  Yes Nadara Mustard, MD  estradiol (ESTRACE) 0.1 MG/GM vaginal cream Apply 3 times weekly using a pea-sized amount on fingertip as directed 05/13/23  Yes Joline Maxcy, MD  ferrous sulfate 325 (65 FE) MG EC tablet Take 325 mg by mouth daily with breakfast.   Yes [provider]  furosemide (LASIX) 20 MG tablet Take 3 tablets (60 mg total) by mouth daily. 06/20/23  Yes Tiffany Kocher, DO  gabapentin (NEURONTIN) 100 MG capsule Take 1 capsule (100 mg total) by mouth 3 (three) times daily. 06/19/23  Yes Tiffany Kocher, DO  insulin glargine (LANTUS) 100 UNIT/ML injection Inject 0.1 mLs (10 Units total) into the skin daily. 06/20/23  Yes Tiffany Kocher, DO  lidocaine (LIDODERM) 5 % Place 1 patch onto the skin daily. Remove & Discard patch within 12 hours or as directed by MD 06/19/23  Yes Tiffany Kocher, DO  loratadine (CLARITIN) 10 MG tablet Take 10 mg by mouth daily.   Yes [provider]  metFORMIN (GLUCOPHAGE) 1000 MG tablet Take 1 tablet (1,000 mg total) by mouth daily with breakfast. 06/20/23  Yes Tiffany Kocher, DO  Multiple Vitamin (MULTIVITAMIN) capsule Take 1 capsule by mouth daily.   Yes [provider]  nitroGLYCERIN (NITROSTAT) 0.4 MG SL tablet Place 1 tablet (0.4 mg total) under the tongue every 5 (five) minutes as needed for chest pain. 08/22/20 08/21/27 Yes Georgeanna Lea,  MD  nystatin (MYCOSTATIN) 100000 UNIT/ML suspension Take 5 mLs by mouth 4 (four) times daily. 07/09/23 07/19/23 Yes [provider]  nystatin (MYCOSTATIN/NYSTOP) powder Apply topically 2 (two) times daily. Under right breast for intertrigo 06/19/23  Yes Tiffany Kocher, DO  pantoprazole (PROTONIX) 40 MG tablet Take 40 mg by mouth 2 (two) times daily. 11/14/16  Yes [provider]  polyethylene glycol (MIRALAX / GLYCOLAX) 17 g packet Take 17 g by mouth daily. 06/20/23  Yes Tiffany Kocher, DO  potassium chloride (MICRO-K) 10 MEQ CR capsule Take 10 mEq by mouth daily. 11/20/14  Yes [provider]  Probiotic Product (CULTRELLE KIDS IMMUNE DEFENSE PO) Take 1 Dose by mouth daily.   Yes [provider]  rOPINIRole (REQUIP) 0.25 MG tablet Take 0.25 mg by mouth at bedtime.   Yes [provider]  rosuvastatin (CRESTOR) 40 MG tablet Take 40 mg by mouth daily.   Yes [provider]  senna (SENOKOT) 8.6 MG TABS tablet Take 2 tablets by mouth at bedtime.   Yes [provider]  sitaGLIPtin (JANUVIA) 100 MG tablet Take 100 mg by mouth daily. 03/11/22  Yes [provider]  sulfamethoxazole-trimethoprim (BACTRIM) 400-80 MG tablet Take 1 tablet by mouth at bedtime. 05/13/23  Yes Joline Maxcy, MD  Vitamin D, Ergocalciferol, (DRISDOL) 1.25 MG (50000 UNIT) CAPS capsule Take 50,000 Units by mouth every Sunday. 02/10/23  Yes [provider]  ONETOUCH ULTRA test strip 1 each by Other route daily. 06/16/23   [provider]  spironolactone (ALDACTONE) 25 MG tablet Take 1/2 (one-half) tablet by mouth once daily Patient not taking: Reported on 07/12/2023 03/20/23   Georgeanna Lea, MD   Allergies  Allergen Reactions   Codeine Swelling   Penicillins Swelling   Xarelto [Rivaroxaban] Other (See Comments)    Causes bleeding in stool and urine   Levofloxacin Other (See Comments)    Other reaction(s): Malaise (intolerance)   Tape Rash    Review of Systems Complains of inability to walk, complains of lower extremity weakness and swelling more than usual. Physical Exam Awake alert sitting up in a chair Shallow regular breath sounds On 2 to 3 L supplemental oxygen via nasal cannula S1-S2 2-3+ lower extremity edema  Vital Signs: BP (!) 143/63 (BP Location: Right Arm)   Pulse 64   Temp 98.1 F (36.7 C) (Oral)   Resp 18   Ht 5\' 3"  (1.6 m)   Wt 107.5 kg   SpO2 97%   BMI 41.96 kg/m  Pain Scale: 0-10   Pain Score: 0-No pain   SpO2: SpO2: 97 % O2 Device:SpO2: 97 % O2 Flow Rate: .O2 Flow Rate (L/min): 2 L/min  IO: Intake/output summary:  Intake/Output Summary (Last 24 hours) at 07/14/2023 1207 Last data filed at 07/14/2023 0900 Gross per 24 hour  Intake 1372.89 ml  Output 400 ml  Net 972.89 ml    LBM: Last BM Date : 07/14/23 Baseline Weight: Weight: 111.1 kg Most recent weight: Weight: 107.5 kg     Palliative Assessment/Data:   Palliative performance scale 40%  Time In: 10 Time Out:  11.20 Time Total:  80 min  Greater than 50%  of this time was spent counseling and coordinating care related to the above assessment and plan.  Signed by: Lujean Sake, MD   Please contact Palliative Medicine Team phone at [phone] for questions and concerns.  For individual provider: See Tilford Foley

## 2023-07-15 ENCOUNTER — Ambulatory Visit: Payer: PPO | Admitting: Podiatry

## 2023-07-15 DIAGNOSIS — R627 Adult failure to thrive: Secondary | ICD-10-CM

## 2023-07-15 MED ORDER — OXYCODONE HCL 5 MG PO TABS
5.0000 mg | ORAL_TABLET | ORAL | Status: DC | PRN
  Administered 2023-07-15: 5 mg via ORAL
  Filled 2023-07-15: qty 1

## 2023-07-15 MED ORDER — MORPHINE SULFATE (PF) 2 MG/ML IV SOLN: 2.0000 mg | INTRAVENOUS | Status: DC | PRN

## 2023-07-15 MED ORDER — DOXYCYCLINE HYCLATE 100 MG PO TABS
100.0000 mg | ORAL_TABLET | Freq: Two times a day (BID) | ORAL | Status: DC
  Administered 2023-07-15 – 2023-07-16 (×2): 100 mg via ORAL
  Filled 2023-07-15 (×2): qty 1

## 2023-07-15 MED ORDER — ORAL CARE MOUTH RINSE: 15.0000 mL | OROMUCOSAL | Status: DC | PRN

## 2023-07-15 MED ORDER — CEFADROXIL 500 MG PO CAPS
1000.0000 mg | ORAL_CAPSULE | Freq: Two times a day (BID) | ORAL | Status: DC
  Administered 2023-07-15 – 2023-07-16 (×2): 1000 mg via ORAL
  Filled 2023-07-15 (×3): qty 2

## 2023-07-15 NOTE — Progress Notes (Addendum)
 Physical Therapy Treatment and Discharge Patient Details Name: Danielle [name] MRN: [id number] DOB: 1933/08/10 Today's Date: 07/15/2023   History of Present Illness Danielle [name] is a 89 y.o. female is an 89 year old female admitted 07/12/23 with weakness, fall, UTI, left lower lobe pneumonia. PMH: arthritis, depression, hypertension, hyperlipidemia, coronary artery disease, chronic diastolic CHF , atrial fibrillation on Eliquis, DM type II, CKD , COPD, OSA, restless leg syndrome, GERD, colon polyps, recurrent UTIs and kidney stones s/p cystoscopy, ureteroscopy, laser lithotripsy and stent placement.She was recently admitted to the hospital 3/15 - 06/20/2023 with acute hypoxic respiratory failure secondary to influenza A, DC to SNF, DC home 07/06/23.    PT Comments  Pt on BSC upon therapist's arrival. Pt needing min A to power to stand, total A with pericare, min A for step pivot steadying to recliner. Pt pleasantly confused, asking therapist if at hospital or rehab; but is able to state she is going home with her daughter and hospice care.  Acute PT signing off, pt plan to d/c home with hospice and daughter support.   If plan is discharge home, recommend the following: A little help with walking and/or transfers;A little help with bathing/dressing/bathroom;Assistance with cooking/housework;Assist for transportation;Help with stairs or ramp for entrance   Can travel by private vehicle     No  Equipment Recommendations  None recommended by PT    Recommendations for Other Services       Precautions / Restrictions Precautions Precautions: Fall Restrictions Weight Bearing Restrictions Per Provider Order: No     Mobility  Bed Mobility                    Transfers Overall transfer level: Needs assistance Equipment used: Rolling walker (2 wheels) Transfers: Sit to/from Stand, Bed to chair/wheelchair/BSC Sit to Stand: Min assist   Step pivot transfers: Min assist        General transfer comment: min A to power up stand and step pivot from [facility] to recliner, cues for RW management close to body, therapist managing lines for safety    Ambulation/Gait                   Stairs             Wheelchair Mobility     Tilt Bed    Modified Rankin (Stroke Patients Only)       Balance Overall balance assessment: Needs assistance, History of Falls         Standing balance support: During functional activity, Bilateral upper extremity supported, Reliant on assistive device for balance Standing balance-Leahy Scale: Poor                              Communication Communication Communication: No apparent difficulties  Cognition Arousal: Alert Behavior During Therapy: WFL for tasks assessed/performed                           PT - Cognition Comments: pt pleasantly confused, asks this therapist twice if she is in hospital or rehab, able to follow commands Following commands: Intact      Cueing    Exercises      General Comments        Pertinent Vitals/Pain Pain Assessment Pain Assessment: No/denies pain    Home Living  Prior Function            PT Goals (current goals can now be found in the care plan section) Acute Rehab PT Goals Patient Stated Goal: home with daughter and hospice care Progress towards PT goals: Goals met/education completed, patient discharged from PT    Frequency    Min 3X/week      PT Plan      Co-evaluation              AM-PAC PT "6 Clicks" Mobility   Outcome Measure  Help needed turning from your back to your side while in a flat bed without using bedrails?: A Little Help needed moving from lying on your back to sitting on the side of a flat bed without using bedrails?: A Little Help needed moving to and from a bed to a chair (including a wheelchair)?: A Little Help needed standing up from a chair using your arms (e.g.,  wheelchair or bedside chair)?: A Little Help needed to walk in hospital room?: A Lot Help needed climbing 3-5 steps with a railing? : Total 6 Click Score: 15    End of Session Equipment Utilized During Treatment: Gait belt;Oxygen Activity Tolerance: Patient limited by fatigue Patient left: in chair;with call bell/phone within reach;with chair alarm set (chair alarm batteries dead- nursing aware and waiting for some ordered) Nurse Communication: Mobility status PT Visit Diagnosis: Unsteadiness on feet (R26.81);Muscle weakness (generalized) (M62.81);History of falling (Z91.81);Repeated falls (R29.6);Difficulty in walking, not elsewhere classified (R26.2)     Time: 1610-9604 PT Time Calculation (min) (ACUTE ONLY): 13 min  Charges:    $Therapeutic Activity: 8-22 mins PT General Charges $$ ACUTE PT VISIT: 1 Visit                     Tori Danique Hartsough PT, DPT 07/15/23, 12:01 PM

## 2023-07-15 NOTE — Progress Notes (Signed)
 PROGRESS NOTE    Danielle [name]  ZOX:[id number] DOB: 1933/07/06 DOA: 07/12/2023 PCP: Gordan Payment., MD   Brief Narrative:  [phone] with h/o chronic diastolic CHF, COPD, DM, HTN, HLD, CAD, OSA, and stage 3a CKD who presented on 4/13 for weakness, cough. She was last hospitalized from 3/15-22 with acute hypoxic respiratory failure in the setting of influenza A. She was discharged to SNF rehab and returned home on Monday, 4/6. She became acutely worse several days later.  She was admitted with possible left lower lobe pneumonia along with UTI and hematochezia.  She was started on IV antibiotics.  GI recommended conservative management.  Palliative care consulted.  Assessment & Plan:   Failure to thrive Poor oral intake Generalized weakness Delirium -Palliative care following.  Patient/daughter interested in home hospice.  TOC has been consulted.  Possible discharge to home with hospice once arrangements have been made.  Left lower lobe pneumonia - Seen on CT.  Continue Rocephin and doxycycline for 5 days.  Blood cultures negative so far.  COVID/influenza/RSV PCR negative. - Continue nebs as needed.  Gross hematuria -reports intermittent gross hematuria since cystoscopy in December.  UA not suggestive of UTI.  Currently on antibiotics that would cover for UTI as well. - Prior hospitalist discussed case with urology/Dr. Schowalter who recommended holding Eliquis and outpatient follow-up with Dr. Margo Aye  Hematochezia/possible hemorrhoidal bleeding - Unrelated to Eliquis.  Eliquis held: Will discontinue given plan for home with hospice - Continue Analpram as per GI recommendations - Hemoglobin currently stable  Anemia of chronic disease - Hemoglobin stable.  Monitor intermittently  Tongue ulceration - Uncertain etiology.  Will not investigate further as patient is going home with hospice  Chronic diastolic CHF - Continue Lasix.  Continue strict input and output and daily weights  along with fluid restriction  COPD/chronic respiratory failure with hypoxia--on 2 L oxygen via nasal cannula at home.  Respiratory status currently stable.  Continue current inhaled regimen  Paroxysmal A-fib - Currently rate controlled.  Continue amiodarone.  Eliquis held  Diabetes mellitus type 2 with hyperglycemia Diabetic neuropathy - Glucophage and Januvia on hold.  Continue long-acting insulin along with CBGs with SSI.  Carb modified diet - Continue gabapentin  Hyperlipidemia Continue statin  Hypertension - Currently not on any antihypertensives.  Blood pressure stable.  CKD stage IIIa - Stable.  Monitor intermittently  Mood disorder -Continue citalopram  Obesity class III -Outpatient follow-up   DVT prophylaxis: SCDs Code Status: DNR Family Communication: None at bedside Disposition Plan: Status is: Inpatient Remains inpatient appropriate because: Of severity of illness  Consultants: Palliative care/GI  Procedures: None  Antimicrobials:  Anti-infectives (From admission, onward)    Start     Dose/Rate Route Frequency Ordered Stop   07/13/23 1000  cefTRIAXone (ROCEPHIN) 2 g in sodium chloride 0.9 % 100 mL IVPB        2 g 200 mL/hr over 30 Minutes Intravenous Daily 07/12/23 1635 07/17/23 0959   07/13/23 1000  azithromycin (ZITHROMAX) 500 mg in sodium chloride 0.9 % 250 mL IVPB  Status:  Discontinued        500 mg 250 mL/hr over 60 Minutes Intravenous Every 24 hours 07/12/23 1635 07/12/23 1752   07/13/23 1000  doxycycline (VIBRA-TABS) tablet 100 mg  Status:  Discontinued        100 mg Oral Every 12 hours 07/12/23 1752 07/12/23 1755   07/13/23 1000  doxycycline (VIBRAMYCIN) 100 mg in sodium chloride 0.9 % 250 mL IVPB  100 mg 125 mL/hr over 120 Minutes Intravenous Every 12 hours 07/12/23 1755 07/17/23 0959   07/12/23 1500  cefTRIAXone (ROCEPHIN) 1 g in sodium chloride 0.9 % 100 mL IVPB        1 g 200 mL/hr over 30 Minutes Intravenous  Once 07/12/23 1451  07/12/23 1519   07/12/23 1500  azithromycin (ZITHROMAX) 500 mg in sodium chloride 0.9 % 250 mL IVPB        500 mg 250 mL/hr over 60 Minutes Intravenous  Once 07/12/23 1451 07/12/23 1601         Subjective: Patient seen and examined at bedside.  Feels slightly better.  Still short of breath with exertion and weak.  Objective: Vitals:   07/14/23 0454 07/14/23 1535 07/14/23 1936 07/15/23 0607  BP: (!) 143/63 126/64 (!) 161/60 123/89  Pulse: 64 61 66 (!) 58  Resp: 18 16 18 18   Temp: 98.1 F (36.7 C) 98.4 F (36.9 C) 98.6 F (37 C) 98.3 F (36.8 C)  TempSrc: Oral Oral Oral Oral  SpO2: 97% 100% 97% 98%  Weight:      Height:        Intake/Output Summary (Last 24 hours) at 07/15/2023 1124 Last data filed at 07/15/2023 1610 Gross per 24 hour  Intake 869.05 ml  Output 300 ml  Net 569.05 ml   Filed Weights   07/12/23 0958 07/12/23 1551  Weight: 111.1 kg 107.5 kg    Examination:  General exam: Appears calm and comfortable.  Looks chronically ill and deconditioned  respiratory system: Bilateral decreased breath sounds at bases with scattered crackles Cardiovascular system: S1 & S2 heard, Rate controlled Gastrointestinal system: Abdomen is morbidly obese, nondistended, soft and nontender. Normal bowel sounds heard. Extremities: No cyanosis, clubbing, edema  Central nervous system: Alert and oriented.  Slow to respond.  Poor historian.  No focal neurological deficits. Moving extremities Skin: No rashes, lesions or ulcers Psychiatry: Flat affect.  Not agitated.    Data Reviewed: I have personally reviewed following labs and imaging studies  CBC: Recent Labs  Lab 07/12/23 1014 07/13/23 0544 07/14/23 0547  WBC 5.3 4.6 4.9  NEUTROABS 3.2  --  2.8  HGB 9.7* 8.7* 8.6*  HCT 32.3* 28.8* 29.2*  MCV 97.9 99.0 99.7  PLT 256 223 171   Basic Metabolic Panel: Recent Labs  Lab 07/12/23 1014 07/13/23 0544 07/14/23 0547  NA 136 137 134*  K 3.8 4.2 4.3  CL 103 104 104  CO2  26 27 23   GLUCOSE 175* 208* 192*  BUN 19 21 24*  CREATININE 0.74 0.66 0.90  CALCIUM 8.5* 8.7* 8.6*   GFR: Estimated Creatinine Clearance: 50.7 mL/min (by C-G formula based on SCr of 0.9 mg/dL). Liver Function Tests: Recent Labs  Lab 07/12/23 1014  AST 27  ALT 18  ALKPHOS 61  BILITOT 0.3  PROT 6.6  ALBUMIN 3.2*   No results for input(s): "LIPASE", "AMYLASE" in the last 168 hours. No results for input(s): "AMMONIA" in the last 168 hours. Coagulation Profile: No results for input(s): "INR", "PROTIME" in the last 168 hours. Cardiac Enzymes: No results for input(s): "CKTOTAL", "CKMB", "CKMBINDEX", "TROPONINI" in the last 168 hours. BNP (last 3 results) Recent Labs    12/23/22 1619  PROBNP 76   HbA1C: No results for input(s): "HGBA1C" in the last 72 hours. CBG: No results for input(s): "GLUCAP" in the last 168 hours. Lipid Profile: No results for input(s): "CHOL", "HDL", "LDLCALC", "TRIG", "CHOLHDL", "LDLDIRECT" in the last 72 hours. Thyroid Function  Tests: No results for input(s): "TSH", "T4TOTAL", "FREET4", "T3FREE", "THYROIDAB" in the last 72 hours. Anemia Panel: No results for input(s): "VITAMINB12", "FOLATE", "FERRITIN", "TIBC", "IRON", "RETICCTPCT" in the last 72 hours. Sepsis Labs: No results for input(s): "PROCALCITON", "LATICACIDVEN" in the last 168 hours.  Recent Results (from the past 240 hours)  Resp panel by RT-PCR (RSV, Flu A&B, Covid)     Status: None   Collection Time: 07/12/23 11:16 AM   Specimen: Nasal Swab  Result Value Ref Range Status   SARS Coronavirus 2 by RT PCR NEGATIVE NEGATIVE Final    Comment: (NOTE) SARS-CoV-2 target nucleic acids are NOT DETECTED.  The SARS-CoV-2 RNA is generally detectable in upper respiratory specimens during the acute phase of infection. The lowest concentration of SARS-CoV-2 viral copies this assay can detect is 138 copies/mL. A negative result does not preclude SARS-Cov-2 infection and should not be used as the  sole basis for treatment or other patient management decisions. A negative result may occur with  improper specimen collection/handling, submission of specimen other than nasopharyngeal swab, presence of viral mutation(s) within the areas targeted by this assay, and inadequate number of viral copies(<138 copies/mL). A negative result must be combined with clinical observations, patient history, and epidemiological information. The expected result is Negative.  Fact Sheet for Patients:  BloggerCourse.com  Fact Sheet for Healthcare Providers:  SeriousBroker.it  This test is no t yet approved or cleared by the [place]  FDA and  has been authorized for detection and/or diagnosis of SARS-CoV-2 by FDA under an Emergency Use Authorization (EUA). This EUA will remain  in effect (meaning this test can be used) for the duration of the COVID-19 declaration under Section 564(b)(1) of the Act, 21 U.S.C.section 360bbb-3(b)(1), unless the authorization is terminated  or revoked sooner.       Influenza A by PCR NEGATIVE NEGATIVE Final   Influenza B by PCR NEGATIVE NEGATIVE Final    Comment: (NOTE) The Xpert Xpress SARS-CoV-2/FLU/RSV plus assay is intended as an aid in the diagnosis of influenza from Nasopharyngeal swab specimens and should not be used as a sole basis for treatment. Nasal washings and aspirates are unacceptable for Xpert Xpress SARS-CoV-2/FLU/RSV testing.  Fact Sheet for Patients: BloggerCourse.com  Fact Sheet for Healthcare Providers: SeriousBroker.it  This test is not yet approved or cleared by the [place]  FDA and has been authorized for detection and/or diagnosis of SARS-CoV-2 by FDA under an Emergency Use Authorization (EUA). This EUA will remain in effect (meaning this test can be used) for the duration of the COVID-19 declaration under Section 564(b)(1) of the  Act, 21 U.S.C. section 360bbb-3(b)(1), unless the authorization is terminated or revoked.     Resp Syncytial Virus by PCR NEGATIVE NEGATIVE Final    Comment: (NOTE) Fact Sheet for Patients: BloggerCourse.com  Fact Sheet for Healthcare Providers: SeriousBroker.it  This test is not yet approved or cleared by the [place]  FDA and has been authorized for detection and/or diagnosis of SARS-CoV-2 by FDA under an Emergency Use Authorization (EUA). This EUA will remain in effect (meaning this test can be used) for the duration of the COVID-19 declaration under Section 564(b)(1) of the Act, 21 U.S.C. section 360bbb-3(b)(1), unless the authorization is terminated or revoked.  Performed at [facility], [address], [place], [place] [address]   Urine Culture     Status: Abnormal   Collection Time: 07/12/23 11:47 AM   Specimen: Urine, Random  Result Value Ref Range Status   Specimen Description  Final    URINE, RANDOM Performed at [facility], [address] [place], [place] [address]    Special Requests   Final    NONE Reflexed from [id number] Performed at [facility], [address], [place], [place] [address]    Culture (A)  Final    <10,000 COLONIES/mL INSIGNIFICANT GROWTH Performed at [facility], [address], [place], [place] [address]    Report Status 07/13/2023 FINAL  Final  Expectorated Sputum Assessment w Gram Stain, Rflx to Resp Cult     Status: None   Collection Time: 07/12/23  2:44 PM   Specimen: Expectorated Sputum  Result Value Ref Range Status   Specimen Description EXPECTORATED SPUTUM  Final   Special Requests NONE  Final   Sputum evaluation   Final    Sputum specimen not acceptable for testing.  Please recollect.   [facility], [place] @ 1825 07/12/23 CAL Performed at [facility], [address] [place], [place] [address]     Report Status 07/12/2023 FINAL  Final  Culture, blood (routine x 2) Call MD if unable to obtain prior to antibiotics being given     Status: None (Preliminary result)   Collection Time: 07/12/23  5:04 PM   Specimen: BLOOD  Result Value Ref Range Status   Specimen Description   Final    BLOOD BLOOD LEFT ARM AEROBIC BOTTLE ONLY Performed at [facility], [address] [place], [place] [address]    Special Requests   Final    BOTTLES DRAWN AEROBIC AND ANAEROBIC Blood Culture results may not be optimal due to an inadequate volume of blood received in culture bottles Performed at [facility], [address] [place], [place] [address]    Culture   Final    NO GROWTH 3 DAYS Performed at [facility], [address] [place], [place] [address]    Report Status PENDING  Incomplete  Culture, blood (routine x 2) Call MD if unable to obtain prior to antibiotics being given     Status: None (Preliminary result)   Collection Time: 07/12/23  5:15 PM   Specimen: BLOOD  Result Value Ref Range Status   Specimen Description   Final    BLOOD BLOOD LEFT ARM AEROBIC BOTTLE ONLY ANAEROBIC BOTTLE ONLY Performed at [facility], [address] [place], [place] [address]    Special Requests   Final    BOTTLES DRAWN AEROBIC AND ANAEROBIC Blood Culture results may not be optimal due to an inadequate volume of blood received in culture bottles Performed at [facility], [address], [place], [place] [address]    Culture   Final    NO GROWTH 3 DAYS Performed at [facility], [address], [place], [place] [address]    Report Status PENDING  Incomplete         Radiology Studies: No results found.      Scheduled Meds:  amiodarone  200 mg Oral Daily   citalopram  20 mg Oral Daily   diclofenac Sodium  2 g Topical QID   ferrous sulfate  325 mg Oral Q breakfast   fluticasone furoate-vilanterol  1 puff  Inhalation Daily   furosemide  40 mg Oral Daily   gabapentin  100 mg Oral QHS   guaiFENesin  5 mL Oral Once   hydrocortisone-pramoxine   Rectal BID   insulin glargine-yfgn  10 Units Subcutaneous  Daily   lactobacillus  1 g Oral Q breakfast   loratadine  10 mg Oral Daily   nystatin   Topical BID   pantoprazole  40 mg Oral BID   polyethylene glycol  17 g Oral Daily   potassium chloride  10 mEq Oral Daily   rOPINIRole  0.25 mg Oral QHS   rosuvastatin  40 mg Oral Daily   senna  2 tablet Oral QHS   sodium chloride flush  3 mL Intravenous Q12H   Continuous Infusions:  cefTRIAXone (ROCEPHIN)  IV Stopped (07/14/23 1610)   doxycycline (VIBRAMYCIN) IV Stopped (07/14/23 1055)          Audria Leather, MD [facility] 07/15/2023, 11:24 AM '

## 2023-07-15 NOTE — TOC Progression Note (Signed)
 Transition of Care [facility]) - Progression Note    Patient Details  Name: Danielle [name] MRN: [id number] Date of Birth: [birth year]  [facility] CM/SW Contact  Loreda Rodriguez, RN Phone Number:[phone]  07/15/2023, 11:36 AM  Clinical Narrative:    Plan to discharge home with hospice on Thursday 07/15/2023.      Barriers to Discharge: Continued Medical Work up  Expected Discharge Plan and Services In-house Referral: NA Discharge Planning Services: CM Consult Post Acute Care Choice: Hospice Living arrangements for the past 2 months: Single Family Home                 DME Arranged: N/A (DME will be arranged by hospice if needed)         HH Arranged: NA HH Agency: NA         Social Determinants of Health (SDOH) Interventions SDOH Screenings   Food Insecurity: No Food Insecurity (07/12/2023)  Housing: Low Risk  (07/12/2023)  Transportation Needs: No Transportation Needs (07/12/2023)  Utilities: Not At Risk (07/12/2023)  Social Connections: Moderately Isolated (07/12/2023)  Tobacco Use: Medium Risk (07/12/2023)    Readmission Risk Interventions    07/14/2023   12:45 PM 06/15/2023   10:32 AM  Readmission Risk Prevention Plan  Transportation Screening Complete Complete  PCP or Specialist Appt within 3-5 Days  Complete  HRI or Home Care Consult  Complete  Social Work Consult for Recovery Care Planning/Counseling  Complete  Palliative Care Screening  Not Applicable  Medication Review Oceanographer) Complete Complete  PCP or Specialist appointment within 3-5 days of discharge Complete   HRI or Home Care Consult Complete   SW Recovery Care/Counseling Consult Complete   Palliative Care Screening Complete   Skilled Nursing Facility Patient Refused

## 2023-07-15 NOTE — Plan of Care (Signed)
  Problem: Education: Goal: Knowledge of General Education information will improve Description: Including pain rating scale, medication(s)/side effects and non-pharmacologic comfort measures Outcome: Not Progressing   Problem: Health Behavior/Discharge Planning: Goal: Ability to manage health-related needs will improve Outcome: Not Progressing   Problem: Clinical Measurements: Goal: Ability to maintain clinical measurements within normal limits will improve Outcome: Not Progressing Goal: Will remain free from infection Outcome: Not Progressing Goal: Diagnostic test results will improve Outcome: Not Progressing Goal: Respiratory complications will improve Outcome: Not Progressing Goal: Cardiovascular complication will be avoided Outcome: Not Progressing   Problem: Activity: Goal: Risk for activity intolerance will decrease Outcome: Not Progressing   Problem: Nutrition: Goal: Adequate nutrition will be maintained Outcome: Not Progressing   Problem: Coping: Goal: Level of anxiety will decrease Outcome: Not Progressing   Problem: Elimination: Goal: Will not experience complications related to bowel motility Outcome: Not Progressing Goal: Will not experience complications related to urinary retention Outcome: Not Progressing   Problem: Pain Managment: Goal: General experience of comfort will improve and/or be controlled Outcome: Not Progressing   Problem: Safety: Goal: Ability to remain free from injury will improve Outcome: Not Progressing   Problem: Skin Integrity: Goal: Risk for impaired skin integrity will decrease Outcome: Not Progressing   Problem: Clinical Measurements: Goal: Ability to maintain clinical measurements within normal limits will improve Outcome: Not Progressing Goal: Will remain free from infection Outcome: Not Progressing Goal: Diagnostic test results will improve Outcome: Not Progressing Goal: Respiratory complications will  improve Outcome: Not Progressing Goal: Cardiovascular complication will be avoided Outcome: Not Progressing   Problem: Safety: Goal: Ability to remain free from injury will improve Outcome: Not Progressing   Problem: Skin Integrity: Goal: Risk for impaired skin integrity will decrease Outcome: Not Progressing   Problem: Activity: Goal: Ability to tolerate increased activity will improve Outcome: Not Progressing

## 2023-07-15 NOTE — Progress Notes (Signed)
 No acute changes overnight, patient took all po meds continue to refuse IV and IV meds, pain managed with prn Tylenol and she was able to sleep with prn melatonin,

## 2023-07-15 NOTE — Progress Notes (Signed)
 Daily Progress Note   Patient Name: Danielle [name]       Date: 07/15/2023 DOB: 16-Jul-1933  Age: 89 y.o. MRN#: [id number] Attending Physician: Audria Leather, MD Primary Care Physician: Abbe Hoard., MD Admit Date: 07/12/2023  Reason for Consultation/Follow-up: Establishing goals of care  Subjective: Patient is awake alert sitting up in a chair.  Able to answer questions appropriately.  She relates that her lower extremity discomfort and the swelling is actually better today.  She is aware that home with hospice arrangements are being pursued and that she is going to be discharged to her daughter's residence with hospice support towards the end of this hospitalization likely within the next 24-48 hours.  Length of Stay: 2  Current Medications: Scheduled Meds:   amiodarone  200 mg Oral Daily   citalopram  20 mg Oral Daily   diclofenac Sodium  2 g Topical QID   ferrous sulfate  325 mg Oral Q breakfast   fluticasone furoate-vilanterol  1 puff Inhalation Daily   furosemide  40 mg Oral Daily   gabapentin  100 mg Oral QHS   guaiFENesin  5 mL Oral Once   hydrocortisone-pramoxine   Rectal BID   insulin glargine-yfgn  10 Units Subcutaneous Daily   lactobacillus  1 g Oral Q breakfast   loratadine  10 mg Oral Daily   nystatin   Topical BID   pantoprazole  40 mg Oral BID   polyethylene glycol  17 g Oral Daily   potassium chloride  10 mEq Oral Daily   rOPINIRole  0.25 mg Oral QHS   rosuvastatin  40 mg Oral Daily   senna  2 tablet Oral QHS   sodium chloride flush  3 mL Intravenous Q12H    Continuous Infusions:  cefTRIAXone (ROCEPHIN)  IV Stopped (07/14/23 0927)   doxycycline (VIBRAMYCIN) IV Stopped (07/14/23 1055)    PRN Meds: acetaminophen **OR** acetaminophen, albuterol,  benzocaine, bisacodyl, guaiFENesin, hydrALAZINE, ondansetron **OR** ondansetron (ZOFRAN) IV, mouth rinse, polyvinyl alcohol  Physical Exam         Awake alert Resting in chair 2+ lower extremity edema Regular work of breathing Remains on supplemental oxygen Mood and affect within normal limits  Vital Signs: BP 123/89 (BP Location: Right Arm)   Pulse (!) 58   Temp 98.3 F (36.8 C) (Oral)  Resp 18   Ht 5\' 3"  (1.6 m)   Wt 107.5 kg   SpO2 98%   BMI 41.96 kg/m  SpO2: SpO2: 98 % O2 Device: O2 Device: Nasal Cannula O2 Flow Rate: O2 Flow Rate (L/min): 2 L/min  Intake/output summary:  Intake/Output Summary (Last 24 hours) at 07/15/2023 0834 Last data filed at 07/14/2023 2020 Gross per 24 hour  Intake 869.05 ml  Output 300 ml  Net 569.05 ml   LBM: Last BM Date : 07/14/23 Baseline Weight: Weight: 111.1 kg Most recent weight: Weight: 107.5 kg       Palliative Assessment/Data:      Patient Active Problem List   Diagnosis Date Noted   Failure to thrive in adult 07/13/2023   Acute lower GI bleeding 07/12/2023   Tongue ulceration 07/12/2023   Generalized weakness 07/12/2023   (HFpEF) heart failure with preserved ejection fraction (HCC) 06/16/2023   Chronic health problem 06/14/2023   Recurrent gross hematuria 06/14/2023   Influenza 06/13/2023   Influenza A 06/13/2023   Acute on chronic respiratory failure with hypoxemia (HCC)  Influenza A 06/13/2023   Type 2 diabetes mellitus (HCC) 06/13/2023   Hepatitis A 06/13/2023   Breast pain, right 06/13/2023   Acute deep vein thrombosis (DVT) of right lower extremity (HCC) 02/19/2023   Nephrolithiasis 02/14/2023   Peripheral polyneuropathy 09/10/2022   Claudication (HCC) 09/10/2022   Subclinical hypothyroidism 03/11/2022   Atrial fibrillation (HCC) 02/28/2022   Paroxysmal atrial fibrillation (HCC) 02/27/2022   Acute on chronic diastolic CHF (congestive heart failure) (HCC) 02/27/2022   Multifocal atrial tachycardia (HCC)  02/26/2022   Coronary artery disease    PONV (postoperative nausea and vomiting)    Osteoporosis    Insomnia    Hypertension    Hyperlipidemia    History of hiatal hernia    Headache    GERD (gastroesophageal reflux disease)    Emphysema lung (HCC)    COPD (chronic obstructive pulmonary disease) (HCC)    CHF (congestive heart failure) (HCC)    Asthma    Arthritis    Anginal pain (HCC)    Callus 05/30/2020   Flat feet, bilateral 05/30/2020   Hammer toe of left foot 05/30/2020   OSA (obstructive sleep apnea) 12/16/2019   Internal hemorrhoids 11/26/2018   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/26/2018   Unstable angina (HCC) 10/30/2018   Positive D-dimer 09/08/2018   DOE (dyspnea on exertion) 09/07/2018   Class 3 obesity 09/07/2018   Edema 08/12/2018   Iron deficiency anemia 08/12/2018   Senile purpura (HCC) 08/12/2018   Strain of right shoulder 08/12/2018   Dysphagia 08/12/2018   Chronic pain of both knees 05/14/2018   Stasis edema of left lower extremity 04/13/2018   Vitamin B12 deficiency 04/13/2018   Restless legs syndrome 03/04/2018   At high risk for falls 11/11/2017   Gastroesophageal reflux disease without esophagitis 11/11/2017   Stage 3a chronic kidney disease (HCC) 05/07/2017   Chronic respiratory failure with hypoxia (HCC) 05/05/2017   Vitamin D deficiency 02/27/2016   Nocturnal hypoxemia due to emphysema (HCC) 12/04/2015   Midsternal chest pain 11/11/2015   Chronic diastolic congestive heart failure (HCC) 11/11/2015   Chest pain 11/09/2015   Depression 11/09/2015   Panlobular emphysema (HCC) 11/09/2015   Elevated blood sugar 11/09/2015   Chronic insomnia 10/24/2015   Type 2 diabetes mellitus with hyperlipidemia (HCC) 08/14/2015   Primary osteoarthritis involving multiple joints 08/14/2015   Malaise and fatigue 08/14/2015   Degenerative lumbar disc 08/14/2015   Abnormal gait 08/14/2015  Mesenteric mass 02/19/2015   Coronary artery disease involving native coronary artery of  native heart without angina pectoris 11/14/2014   Mixed hyperlipidemia 11/14/2014   Current mild episode of major depressive disorder (HCC) 11/14/2014   Left lower lobe pneumonia 2013    Palliative Care Assessment & Plan   Patient Profile:    Assessment: 89 year old lady with chronic diastolic CHF, COPD, diabetes, hypertension, dyslipidemia, coronary artery disease, OSA, stage III CKD. Patient admitted to hospital medicine service for weakness of cough concern for left lower lobe pneumonia failure to thrive.  Hospital course also complicated by gross hematuria and hematochezia.    Recommendations/Plan: DNR/DNI Home with hospice towards the end of this hospitalization.  No new inpatient palliative specific recommendations.    Code Status:    Code Status Orders  (From admission, onward)           Start     Ordered   07/12/23 1635  Do not attempt resuscitation (DNR)- Limited -Do Not Intubate (DNI)  Continuous       Question Answer Comment  If pulseless and not breathing No CPR or chest compressions.   In Pre-Arrest Conditions (Patient Is Breathing and Has A Pulse) Do not intubate. Provide all appropriate non-invasive medical interventions. Avoid ICU transfer unless indicated or required.   Consent: Discussion documented in EHR or advanced directives reviewed      07/12/23 1635           Code Status History     Date Active Date Inactive Code Status Order ID Comments User Context   06/13/2023 1138 06/20/2023 1746 Limited: Do not attempt resuscitation (DNR) -DNR-LIMITED -Do Not Intubate/DNI  [id number]  Sarahann Cumins, DO ED   02/14/2023 0941 02/19/2023 1928 Limited: Do not attempt resuscitation (DNR) -DNR-LIMITED -Do Not Intubate/DNI  [id number]  Lovell Rubenstein, NP Inpatient   02/26/2022 2250 03/01/2022 2124 Partial Code [id number]  Maylene Spear, MD ED   10/30/2018 0257 10/30/2018 2049 Partial Code [id number]  Davida Espy, MD Inpatient   11/09/2015 1539 11/11/2015  1919 Partial Code [id number]  Rosi Converse, PA-C ED   02/19/2015 1510 02/22/2015 1822 Full Code [id number]  Lockie Rima, MD Inpatient      Advance Directive Documentation    Flowsheet Row Most Recent Value  Type of Advance Directive Out of facility DNR (pink MOST or yellow form)  Pre-existing out of facility DNR order (yellow form or pink MOST form) Pink Most/Yellow Form available - Physician notified to receive inpatient order  "MOST" Form in Place? --       Prognosis:  < 6 months  Discharge Planning: Home with Hospice  Care plan was discussed with patient  Thank you for allowing the Palliative Medicine Team to assist in the care of this patient. MOD MDM     Greater than 50%  of this time was spent counseling and coordinating care related to the above assessment and plan.  Lujean Sake, MD  Please contact Palliative Medicine Team phone at [phone] for questions and concerns.

## 2023-07-16 DIAGNOSIS — R627 Adult failure to thrive: Secondary | ICD-10-CM | POA: Diagnosis not present

## 2023-07-16 MED ORDER — BENZOCAINE 10 % MT GEL: Freq: Two times a day (BID) | OROMUCOSAL | 0 refills | Status: DC | PRN

## 2023-07-16 MED ORDER — HYDROCORT-PRAMOXINE (PERIANAL) 2.5-1 % EX CREA: TOPICAL_CREAM | Freq: Two times a day (BID) | CUTANEOUS | 0 refills | Status: DC

## 2023-07-16 MED ORDER — FUROSEMIDE 20 MG PO TABS: 40.0000 mg | ORAL_TABLET | Freq: Every day | ORAL | Status: DC

## 2023-07-16 MED ORDER — CEFADROXIL 500 MG PO CAPS: 1000.0000 mg | ORAL_CAPSULE | Freq: Two times a day (BID) | ORAL | 0 refills | Status: AC

## 2023-07-16 MED ORDER — GUAIFENESIN ER 600 MG PO TB12: 600.0000 mg | ORAL_TABLET | Freq: Two times a day (BID) | ORAL | 0 refills | Status: DC | PRN

## 2023-07-16 MED ORDER — OXYCODONE HCL 5 MG PO TABS: 5.0000 mg | ORAL_TABLET | ORAL | 0 refills | Status: DC | PRN

## 2023-07-16 NOTE — Progress Notes (Signed)
 Daily Progress Note   Patient Name: Danielle [name]       Date: 07/16/2023 DOB: 1934-02-28  Age: 89 y.o. MRN#: [id number] Attending Physician: No att. providers found Primary Care Physician: Abbe Hoard., MD Admit Date: 07/12/2023  Reason for Consultation/Follow-up: Establishing goals of care  Subjective: Patient is awake alert sitting up in a chair.   Home with hospice today  Length of Stay: 3  Current Medications: Scheduled Meds:   amiodarone  200 mg Oral Daily   cefadroxil  1,000 mg Oral BID   citalopram  20 mg Oral Daily   diclofenac Sodium  2 g Topical QID   doxycycline  100 mg Oral Q12H   ferrous sulfate  325 mg Oral Q breakfast   fluticasone furoate-vilanterol  1 puff Inhalation Daily   furosemide  40 mg Oral Daily   gabapentin  100 mg Oral QHS   guaiFENesin  5 mL Oral Once   hydrocortisone-pramoxine   Rectal BID   insulin glargine-yfgn  10 Units Subcutaneous Daily   lactobacillus  1 g Oral Q breakfast   loratadine  10 mg Oral Daily   nystatin   Topical BID   pantoprazole  40 mg Oral BID   polyethylene glycol  17 g Oral Daily   potassium chloride  10 mEq Oral Daily   rOPINIRole  0.25 mg Oral QHS   rosuvastatin  40 mg Oral Daily   senna  2 tablet Oral QHS   sodium chloride flush  3 mL Intravenous Q12H    Continuous Infusions:    PRN Meds: acetaminophen **OR** acetaminophen, albuterol, benzocaine, bisacodyl, guaiFENesin, hydrALAZINE, morphine injection, ondansetron **OR** ondansetron (ZOFRAN) IV, mouth rinse, oxyCODONE, polyvinyl alcohol  Physical Exam         Awake alert Resting in chair 2+ lower extremity edema Regular work of breathing Remains on supplemental oxygen Mood and affect within normal limits  Vital Signs: BP 119/78 (BP Location: Left  Arm)   Pulse (!) 58   Temp 98 F (36.7 C) (Oral)   Resp 18   Ht 5\' 3"  (1.6 m)   Wt 107.5 kg   SpO2 99%   BMI 41.96 kg/m  SpO2: SpO2: 99 % O2 Device: O2 Device: Nasal Cannula O2 Flow Rate: O2 Flow Rate (L/min): 2 L/min  Intake/output summary:  Intake/Output Summary (  Last 24 hours) at 07/16/2023 1158 Last data filed at 07/16/2023 1610 Gross per 24 hour  Intake 120 ml  Output --  Net 120 ml   LBM: Last BM Date : 07/15/23 Baseline Weight: Weight: 111.1 kg Most recent weight: Weight: 107.5 kg       Palliative Assessment/Data:      Patient Active Problem List   Diagnosis Date Noted   Failure to thrive in adult 07/13/2023   Acute lower GI bleeding 07/12/2023   Tongue ulceration 07/12/2023   Generalized weakness 07/12/2023   (HFpEF) heart failure with preserved ejection fraction (HCC) 06/16/2023   Chronic health problem 06/14/2023   Recurrent gross hematuria 06/14/2023   Influenza 06/13/2023   Influenza A 06/13/2023   Acute on chronic respiratory failure with hypoxemia (HCC)  Influenza A 06/13/2023   Type 2 diabetes mellitus (HCC) 06/13/2023   Hepatitis A 06/13/2023   Breast pain, right 06/13/2023   Acute deep vein thrombosis (DVT) of right lower extremity (HCC) 02/19/2023   Nephrolithiasis 02/14/2023   Peripheral polyneuropathy 09/10/2022   Claudication (HCC) 09/10/2022   Subclinical hypothyroidism 03/11/2022   Atrial fibrillation (HCC) 02/28/2022   Paroxysmal atrial fibrillation (HCC) 02/27/2022   Acute on chronic diastolic CHF (congestive heart failure) (HCC) 02/27/2022   Multifocal atrial tachycardia (HCC) 02/26/2022   Coronary artery disease    PONV (postoperative nausea and vomiting)    Osteoporosis    Insomnia    Hypertension    Hyperlipidemia    History of hiatal hernia    Headache    GERD (gastroesophageal reflux disease)    Emphysema lung (HCC)    COPD (chronic obstructive pulmonary disease) (HCC)    CHF (congestive heart failure) (HCC)    Asthma     Arthritis    Anginal pain (HCC)    Callus 05/30/2020   Flat feet, bilateral 05/30/2020   Hammer toe of left foot 05/30/2020   OSA (obstructive sleep apnea) 12/16/2019   Internal hemorrhoids 11/26/2018   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/26/2018   Unstable angina (HCC) 10/30/2018   Positive D-dimer 09/08/2018   DOE (dyspnea on exertion) 09/07/2018   Class 3 obesity 09/07/2018   Edema 08/12/2018   Iron deficiency anemia 08/12/2018   Senile purpura (HCC) 08/12/2018   Strain of right shoulder 08/12/2018   Dysphagia 08/12/2018   Chronic pain of both knees 05/14/2018   Stasis edema of left lower extremity 04/13/2018   Vitamin B12 deficiency 04/13/2018   Restless legs syndrome 03/04/2018   At high risk for falls 11/11/2017   Gastroesophageal reflux disease without esophagitis 11/11/2017   Stage 3a chronic kidney disease (HCC) 05/07/2017   Chronic respiratory failure with hypoxia (HCC) 05/05/2017   Vitamin D deficiency 02/27/2016   Nocturnal hypoxemia due to emphysema (HCC) 12/04/2015   Midsternal chest pain 11/11/2015   Chronic diastolic congestive heart failure (HCC) 11/11/2015   Chest pain 11/09/2015   Depression 11/09/2015   Panlobular emphysema (HCC) 11/09/2015   Elevated blood sugar 11/09/2015   Chronic insomnia 10/24/2015   Type 2 diabetes mellitus with hyperlipidemia (HCC) 08/14/2015   Primary osteoarthritis involving multiple joints 08/14/2015   Malaise and fatigue 08/14/2015   Degenerative lumbar disc 08/14/2015   Abnormal gait 08/14/2015   Mesenteric mass 02/19/2015   Coronary artery disease involving native coronary artery of native heart without angina pectoris 11/14/2014   Mixed hyperlipidemia 11/14/2014   Current mild episode of major depressive disorder (HCC) 11/14/2014   Left lower lobe pneumonia 2013    Palliative Care Assessment & Plan  Patient Profile:    Assessment: 89 year old lady with chronic diastolic CHF, COPD, diabetes, hypertension, dyslipidemia, coronary  artery disease, OSA, stage III CKD. Patient admitted to hospital medicine service for weakness of cough concern for left lower lobe pneumonia failure to thrive.  Hospital course also complicated by gross hematuria and hematochezia.    Recommendations/Plan: DNR/DNI Home with hospice towards the end of this hospitalization.   I met with the patient's daughter.  She had questions with regards to patient's medication list as well as oral anticoagulation.  Explained to the best of my ability.  Discharge home with hospice today.  Code Status:    Code Status Orders  (From admission, onward)           Start     Ordered   07/12/23 1635  Do not attempt resuscitation (DNR)- Limited -Do Not Intubate (DNI)  Continuous       Question Answer Comment  If pulseless and not breathing No CPR or chest compressions.   In Pre-Arrest Conditions (Patient Is Breathing and Has A Pulse) Do not intubate. Provide all appropriate non-invasive medical interventions. Avoid ICU transfer unless indicated or required.   Consent: Discussion documented in EHR or advanced directives reviewed      07/12/23 1635           Code Status History     Date Active Date Inactive Code Status Order ID Comments User Context   06/13/2023 1138 06/20/2023 1746 Limited: Do not attempt resuscitation (DNR) -DNR-LIMITED -Do Not Intubate/DNI  [id number]  Sarahann Cumins, DO ED   02/14/2023 0941 02/19/2023 1928 Limited: Do not attempt resuscitation (DNR) -DNR-LIMITED -Do Not Intubate/DNI  [id number]  Lovell Rubenstein, NP Inpatient   02/26/2022 2250 03/01/2022 2124 Partial Code [id number]  Maylene Spear, MD ED   10/30/2018 0257 10/30/2018 2049 Partial Code [id number]  Davida Espy, MD Inpatient   11/09/2015 1539 11/11/2015 1919 Partial Code [id number]  Rosi Converse, PA-C ED   02/19/2015 1510 02/22/2015 1822 Full Code [id number]  Lockie Rima, MD Inpatient      Advance Directive Documentation    Flowsheet Row Most Recent Value   Type of Advance Directive Out of facility DNR (pink MOST or yellow form)  Pre-existing out of facility DNR order (yellow form or pink MOST form) Pink Most/Yellow Form available - Physician notified to receive inpatient order  "MOST" Form in Place? --       Prognosis:  < 6 months  Discharge Planning: Home with Hospice  Care plan was discussed with patient and her daughter Ms. Turner  Thank you for allowing the Palliative Medicine Team to assist in the care of this patient. MOD MDM     Greater than 50%  of this time was spent counseling and coordinating care related to the above assessment and plan.  Lujean Sake, MD  Please contact Palliative Medicine Team phone at [phone] for questions and concerns.

## 2023-07-16 NOTE — Plan of Care (Signed)
  Problem: Education: Goal: Knowledge of General Education information will improve Description: Including pain rating scale, medication(s)/side effects and non-pharmacologic comfort measures Outcome: Progressing   Problem: Clinical Measurements: Goal: Cardiovascular complication will be avoided Outcome: Progressing   Problem: Activity: Goal: Risk for activity intolerance will decrease Outcome: Progressing   Problem: Coping: Goal: Level of anxiety will decrease Outcome: Progressing   Problem: Elimination: Goal: Will not experience complications related to bowel motility Outcome: Progressing   Problem: Pain Managment: Goal: General experience of comfort will improve and/or be controlled Outcome: Progressing   Problem: Activity: Goal: Ability to tolerate increased activity will improve Outcome: Progressing   Problem: Coping: Goal: Level of anxiety will decrease Outcome: Progressing   Problem: Pain Managment: Goal: General experience of comfort will improve and/or be controlled Outcome: Progressing   Problem: Safety: Goal: Ability to remain free from injury will improve Outcome: Progressing

## 2023-07-16 NOTE — Plan of Care (Signed)
 Pt and pt family received, educated on, and understands discharge instructions. Pt and pt family with all belongings.  Awaiting transportation

## 2023-07-16 NOTE — Care Management Important Message (Signed)
 Important Message  Patient Details No IM Letter given due to Home Hospice. Name: Danielle [name] MRN: [id number] Date of Birth: 06/07/1933   Important Message Given:  No     [name] 07/16/2023, 10:54 AM

## 2023-07-16 NOTE — Discharge Summary (Signed)
 Physician Discharge Summary  Danielle [name] EAV:[id number] DOB: 1933/09/16 DOA: 07/12/2023  PCP: Gordan Payment., MD  Admit date: 07/12/2023 Discharge date: 07/16/2023  Admitted From: Home Disposition: Home with hospice  Recommendations for Outpatient Follow-up:  Follow up with home hospice at earliest convenience   Home Health: No Equipment/Devices: None  Discharge Condition: Guarded to poor CODE STATUS: DNR Diet recommendation: Heart healthy/carb modified  Brief/Interim Summary: 89yo with h/o chronic diastolic CHF, COPD, DM, HTN, HLD, CAD, OSA, and stage 3a CKD who presented on 4/13 for weakness, cough. She was last hospitalized from 3/15-22 with acute hypoxic respiratory failure in the setting of influenza A. She was discharged to SNF rehab and returned home on Monday, 4/6. She became acutely worse several days later.  She was admitted with possible left lower lobe pneumonia along with UTI and hematochezia.  She was started on IV antibiotics.  GI recommended conservative management.  Palliative care consulted.  Patient has decided to pursue home hospice.  She will be discharged to home with hospice once arrangements have been made.  Discharge Diagnoses:   Failure to thrive Poor oral intake Generalized weakness Delirium -Palliative care following.  Patient/daughter interested in home hospice.  TOC has been consulted.  She will be discharged to home with hospice once arrangements have been made.   Left lower lobe pneumonia - Seen on CT. initially on Rocephin and doxycycline: Switched to cefadroxil and doxycycline.  Blood cultures negative so far.  COVID/influenza/RSV PCR negative. - Will switch to cefadroxil alone for 3 more days on discharge today.   Gross hematuria -reports intermittent gross hematuria since cystoscopy in December.  UA not suggestive of UTI.  Currently on antibiotics that would cover for UTI as well. - Prior hospitalist discussed case with urology/Dr.  Schowalter who recommended holding Eliquis and outpatient follow-up with Dr. Margo Aye   Hematochezia/possible hemorrhoidal bleeding - Unrelated to Eliquis.  Eliquis held: Will discontinue given plan for home with hospice - Continue Analpram for 7 days as per GI recommendations - Hemoglobin currently stable   Anemia of chronic disease - Hemoglobin stable.     Tongue ulceration - Uncertain etiology.  Will not investigate further as patient is going home with hospice   Chronic diastolic CHF - Continue Lasix.  Continue diet and fluid restriction   COPD/chronic respiratory failure with hypoxia--on 2 L oxygen via nasal cannula at home.  Respiratory status currently stable.    Paroxysmal A-fib - Currently rate controlled.  Continue amiodarone.  Eliquis has been discontinued   Diabetes mellitus type 2 with hyperglycemia Diabetic neuropathy - Glucophage and Januvia will be resumed.  -Carb modified diet - Continue gabapentin   Hyperlipidemia -Continue statin   Hypertension - Currently not on any antihypertensives.  Blood pressure stable.   CKD stage IIIa - Stable.   Mood disorder -Continue citalopram   Obesity class III -Outpatient follow-up  Discharge Instructions   Allergies as of 07/16/2023       Reactions   Codeine Swelling   Penicillins Swelling   Xarelto [rivaroxaban] Other (See Comments)   Causes bleeding in stool and urine   Levofloxacin Other (See Comments)   Other reaction(s): Malaise (intolerance)   Tape Rash        Medication List     STOP taking these medications    apixaban 2.5 MG Tabs tablet Commonly known as: ELIQUIS   spironolactone 25 MG tablet Commonly known as: ALDACTONE   sulfamethoxazole-trimethoprim 400-80 MG tablet Commonly known as: BACTRIM  TAKE these medications    acetaminophen 500 MG tablet Commonly known as: TYLENOL Take 500 mg by mouth as needed for mild pain (pain score 1-3) or headache.   albuterol 108 (90 Base)  MCG/ACT inhaler Commonly known as: VENTOLIN HFA Inhale 2 puffs into the lungs every 6 (six) hours as needed for wheezing or shortness of breath.   amiodarone 200 MG tablet Commonly known as: PACERONE Take 1 tablet (200 mg total) by mouth daily.   benzocaine 10 % mucosal gel Commonly known as: ORAJEL Use as directed in the mouth or throat 2 (two) times daily as needed (tongue ulcer).   budesonide-formoterol 160-4.5 MCG/ACT inhaler Commonly known as: SYMBICORT Inhale 2 puffs into the lungs 2 (two) times daily.   carboxymethylcellulose 0.5 % Soln Commonly known as: REFRESH PLUS Place 1 drop into both eyes daily as needed (dry eyes).   cefadroxil 500 MG capsule Commonly known as: DURICEF Take 2 capsules (1,000 mg total) by mouth 2 (two) times daily for 3 days.   citalopram 20 MG tablet Commonly known as: CELEXA Take 20 mg by mouth daily.   CRANBERRY PO Take 1 tablet by mouth daily.   CULTRELLE KIDS IMMUNE DEFENSE PO Take 1 Dose by mouth daily.   diclofenac Sodium 1 % Gel Commonly known as: VOLTAREN APPLY 2 GRAMS TOPICALLY TO AFFECTED AREA THREE TIMES DAILY   estradiol 0.1 MG/GM vaginal cream Commonly known as: ESTRACE Apply 3 times weekly using a pea-sized amount on fingertip as directed   ferrous sulfate 325 (65 FE) MG EC tablet Take 325 mg by mouth daily with breakfast.   furosemide 20 MG tablet Commonly known as: LASIX Take 2 tablets (40 mg total) by mouth daily. What changed: how much to take   gabapentin 100 MG capsule Commonly known as: NEURONTIN Take 1 capsule (100 mg total) by mouth 3 (three) times daily.   guaiFENesin 600 MG 12 hr tablet Commonly known as: MUCINEX Take 1 tablet (600 mg total) by mouth 2 (two) times daily as needed for cough.   hydrocortisone-pramoxine 2.5-1 % rectal cream Commonly known as: ANALPRAM-HC Place rectally 2 (two) times daily.   insulin glargine 100 UNIT/ML injection Commonly known as: LANTUS Inject 0.1 mLs (10 Units  total) into the skin daily.   lidocaine 5 % Commonly known as: LIDODERM Place 1 patch onto the skin daily. Remove & Discard patch within 12 hours or as directed by MD   loratadine 10 MG tablet Commonly known as: CLARITIN Take 10 mg by mouth daily.   metFORMIN 1000 MG tablet Commonly known as: GLUCOPHAGE Take 1 tablet (1,000 mg total) by mouth daily with breakfast.   multivitamin capsule Take 1 capsule by mouth daily.   nitroGLYCERIN 0.4 MG SL tablet Commonly known as: Nitrostat Place 1 tablet (0.4 mg total) under the tongue every 5 (five) minutes as needed for chest pain.   nystatin 100000 UNIT/ML suspension Commonly known as: MYCOSTATIN Take 5 mLs by mouth 4 (four) times daily.   nystatin powder Commonly known as: MYCOSTATIN/NYSTOP Apply topically 2 (two) times daily. Under right breast for intertrigo   OneTouch Ultra test strip Generic drug: glucose blood 1 each by Other route daily.   oxyCODONE 5 MG immediate release tablet Commonly known as: Oxy IR/ROXICODONE Take 1 tablet (5 mg total) by mouth every 4 (four) hours as needed for moderate pain (pain score 4-6).   pantoprazole 40 MG tablet Commonly known as: PROTONIX Take 40 mg by mouth 2 (two) times daily.  polyethylene glycol 17 g packet Commonly known as: MIRALAX / GLYCOLAX Take 17 g by mouth daily.   potassium chloride 10 MEQ CR capsule Commonly known as: MICRO-K Take 10 mEq by mouth daily.   rOPINIRole 0.25 MG tablet Commonly known as: REQUIP Take 0.25 mg by mouth at bedtime.   rosuvastatin 40 MG tablet Commonly known as: CRESTOR Take 40 mg by mouth daily.   senna 8.6 MG Tabs tablet Commonly known as: SENOKOT Take 2 tablets by mouth at bedtime.   sitaGLIPtin 100 MG tablet Commonly known as: JANUVIA Take 100 mg by mouth daily.   Vitamin D (Ergocalciferol) 1.25 MG (50000 UNIT) Caps capsule Commonly known as: DRISDOL Take 50,000 Units by mouth every Sunday.        Follow-up Information      Home hospice Follow up.   Why: At earliest convenience               Allergies  Allergen Reactions   Codeine Swelling   Penicillins Swelling   Xarelto [Rivaroxaban] Other (See Comments)    Causes bleeding in stool and urine   Levofloxacin Other (See Comments)    Other reaction(s): Malaise (intolerance)   Tape Rash    Consultations: Palliative care/GI   Procedures/Studies: CT ABDOMEN PELVIS W CONTRAST Result Date: 07/12/2023 CLINICAL DATA:  Clinical concern for diverticulitis EXAM: CT ABDOMEN AND PELVIS WITH CONTRAST TECHNIQUE: Multidetector CT imaging of the abdomen and pelvis was performed using the standard protocol following bolus administration of intravenous contrast. RADIATION DOSE REDUCTION: This exam was performed according to the departmental dose-optimization program which includes automated exposure control, adjustment of the mA and/or kV according to patient size and/or use of iterative reconstruction technique. CONTRAST:  OMNIPAQUE IOHEXOL 300 MG/ML  SOLN COMPARISON:  CT of the abdomen and pelvis performed February 13, 2023 FINDINGS: Lower chest: A small focus of airspace consolidation is present in the left lower lobe. Hepatobiliary: No focal liver abnormality is seen. Status post cholecystectomy. No biliary dilatation. Pancreas: Nothing significant. Spleen: Nothing significant. Adrenals/Urinary Tract: Calcifications are present in both kidneys. Small low-attenuation foci are present in both kidneys which are incompletely characterized. Urinary bladder is decompressed. Stomach/Bowel: No dilated loops of bowel are seen. Vascular/Lymphatic: Atherosclerotic changes are present in the abdominal aorta and major branch vessels. No abdominal aortic aneurysm. Reproductive: Uterus is surgically absent. Other: Nothing significant. Musculoskeletal: Degenerative changes are present in the imaged osseous structures. IMPRESSION: 1. Left lower lobe pneumonia. 2. No evidence of  diverticulitis. Electronically Signed   By: Lowell Guitar M.D.   On: 07/12/2023 13:35   DG Chest 2 View Result Date: 07/12/2023 CLINICAL DATA:  Shortness of breath and weakness EXAM: CHEST - 2 VIEW COMPARISON:  06/18/2023 FINDINGS: Cardiomegaly. Mild scarring at the left lung base is stable. There is no edema, consolidation, effusion, or pneumothorax. Extensive artifact from EKG leads. IMPRESSION: No evidence of active disease.  Chronic cardiomegaly. Electronically Signed   By: Tiburcio Pea M.D.   On: 07/12/2023 11:13   DG CHEST PORT 1 VIEW Result Date: 06/18/2023 CLINICAL DATA:  Dyspnea EXAM: PORTABLE CHEST 1 VIEW COMPARISON:  06/17/2023 FINDINGS: Single frontal view of the chest demonstrates stable enlargement the cardiac silhouette and ectasia of the thoracic aorta. Stable atherosclerosis. No acute airspace disease. Trace left pleural effusion versus pleural thickening. No pneumothorax. No acute bony abnormalities. IMPRESSION: 1. Stable trace left pleural effusion or pleural thickening. Electronically Signed   By: Sharlet Salina M.D.   On: 06/18/2023 20:00  DG Chest 2 View Result Date: 06/17/2023 CLINICAL DATA:  Follow-up pleural effusions. EXAM: CHEST - 2 VIEW COMPARISON:  Chest radiograph earlier today FINDINGS: Persistent left lung base opacity, without significant interval change. No new airspace disease. Small suspected pleural effusions are unchanged from earlier today. Stable heart size and mediastinal contours. No pneumothorax. IMPRESSION: Unchanged left lung base opacity and small suspected pleural effusions. Electronically Signed   By: Chadwick Colonel M.D.   On: 06/17/2023 15:33   DG Shoulder Right Result Date: 06/17/2023 CLINICAL DATA:  Shoulder pain. EXAM: RIGHT SHOULDER - 2+ VIEW COMPARISON:  None Available. FINDINGS: Crush and transscapular Y-views obtained. Normal alignment on provided views. Mild acromioclavicular degenerative spurring. No evidence of fracture, erosive or bony  destructive change. Unremarkable soft tissues. IMPRESSION: Mild acromioclavicular degenerative spurring. Electronically Signed   By: Chadwick Colonel M.D.   On: 06/17/2023 15:32   DG CHEST PORT 1 VIEW Result Date: 06/17/2023 CLINICAL DATA:  Productive cough. EXAM: PORTABLE CHEST 1 VIEW COMPARISON:  06/13/2023 FINDINGS: Left base collapse/consolidation noted with probable tiny bilateral pleural effusions. Right lung clear. No acute bony abnormality. Telemetry leads overlie the chest. IMPRESSION: Left base collapse/consolidation with probable tiny bilateral pleural effusions. Electronically Signed   By: Donnal Fusi M.D.   On: 06/17/2023 06:56      Subjective: Patient seen and examined at bedside.  Still feels weak but feels okay to go home today.  Complains of burning during urination.  No fever or vomiting reported.  Discharge Exam: Vitals:   07/16/23 0523 07/16/23 0748  BP: 119/78   Pulse: (!) 58   Resp: 18   Temp: 98 F (36.7 C)   SpO2: 99% 99%    General: Pt is alert, awake, not in acute distress.  Looks chronically ill and deconditioned.  Elderly female lying in bed.  Slow to respond.  Poor historian. Cardiovascular: Mild intermittent bradycardia present; S1/S2 + Respiratory: bilateral decreased breath sounds at bases with scattered Abdominal: Soft, morbidly obese, NT, ND, bowel sounds + Extremities: Mild lower extremity edema present; no cyanosis    The results of significant diagnostics from this hospitalization (including imaging, microbiology, ancillary and laboratory) are listed below for reference.     Microbiology: Recent Results (from the past 240 hours)  Resp panel by RT-PCR (RSV, Flu A&B, Covid)     Status: None   Collection Time: 07/12/23 11:16 AM   Specimen: Nasal Swab  Result Value Ref Range Status   SARS Coronavirus 2 by RT PCR NEGATIVE NEGATIVE Final    Comment: (NOTE) SARS-CoV-2 target nucleic acids are NOT DETECTED.  The SARS-CoV-2 RNA is generally  detectable in upper respiratory specimens during the acute phase of infection. The lowest concentration of SARS-CoV-2 viral copies this assay can detect is 138 copies/mL. A negative result does not preclude SARS-Cov-2 infection and should not be used as the sole basis for treatment or other patient management decisions. A negative result may occur with  improper specimen collection/handling, submission of specimen other than nasopharyngeal swab, presence of viral mutation(s) within the areas targeted by this assay, and inadequate number of viral copies(<138 copies/mL). A negative result must be combined with clinical observations, patient history, and epidemiological information. The expected result is Negative.  Fact Sheet for Patients:  BloggerCourse.com  Fact Sheet for Healthcare Providers:  SeriousBroker.it  This test is no t yet approved or cleared by the [place]  FDA and  has been authorized for detection and/or diagnosis of SARS-CoV-2 by FDA under an Emergency Use  Authorization (EUA). This EUA will remain  in effect (meaning this test can be used) for the duration of the COVID-19 declaration under Section 564(b)(1) of the Act, 21 U.S.C.section 360bbb-3(b)(1), unless the authorization is terminated  or revoked sooner.       Influenza A by PCR NEGATIVE NEGATIVE Final   Influenza B by PCR NEGATIVE NEGATIVE Final    Comment: (NOTE) The Xpert Xpress SARS-CoV-2/FLU/RSV plus assay is intended as an aid in the diagnosis of influenza from Nasopharyngeal swab specimens and should not be used as a sole basis for treatment. Nasal washings and aspirates are unacceptable for Xpert Xpress SARS-CoV-2/FLU/RSV testing.  Fact Sheet for Patients: BloggerCourse.com  Fact Sheet for Healthcare Providers: SeriousBroker.it  This test is not yet approved or cleared by the [place] FDA  and has been authorized for detection and/or diagnosis of SARS-CoV-2 by FDA under an Emergency Use Authorization (EUA). This EUA will remain in effect (meaning this test can be used) for the duration of the COVID-19 declaration under Section 564(b)(1) of the Act, 21 U.S.C. section 360bbb-3(b)(1), unless the authorization is terminated or revoked.     Resp Syncytial Virus by PCR NEGATIVE NEGATIVE Final    Comment: (NOTE) Fact Sheet for Patients: BloggerCourse.com  Fact Sheet for Healthcare Providers: SeriousBroker.it  This test is not yet approved or cleared by the [place] FDA and has been authorized for detection and/or diagnosis of SARS-CoV-2 by FDA under an Emergency Use Authorization (EUA). This EUA will remain in effect (meaning this test can be used) for the duration of the COVID-19 declaration under Section 564(b)(1) of the Act, 21 U.S.C. section 360bbb-3(b)(1), unless the authorization is terminated or revoked.  Performed at [facility], [address], [place], [place] [address]   Urine Culture     Status: Abnormal   Collection Time: 07/12/23 11:47 AM   Specimen: Urine, Random  Result Value Ref Range Status   Specimen Description   Final    URINE, RANDOM Performed at [facility], [address] [place], [place] [address]    Special Requests   Final    NONE Reflexed from [id number] Performed at [facility], [address] [place], [place] [address]    Culture (A)  Final    <10,000 COLONIES/mL INSIGNIFICANT GROWTH Performed at [facility], [address], [place], [place] [address]    Report Status 07/13/2023 FINAL  Final  Expectorated Sputum Assessment w Gram Stain, Rflx to Resp Cult     Status: None   Collection Time: 07/12/23  2:44 PM   Specimen: Expectorated Sputum  Result Value Ref Range Status   Specimen Description EXPECTORATED SPUTUM   Final   Special Requests NONE  Final   Sputum evaluation   Final    Sputum specimen not acceptable for testing.  Please recollect.   [facility], [place] @ 1825 07/12/23 CAL Performed at [facility], [address], [place], [place] [address]    Report Status 07/12/2023 FINAL  Final  Culture, blood (routine x 2) Call MD if unable to obtain prior to antibiotics being given     Status: None (Preliminary result)   Collection Time: 07/12/23  5:04 PM   Specimen: BLOOD  Result Value Ref Range Status   Specimen Description   Final    BLOOD BLOOD LEFT ARM AEROBIC BOTTLE ONLY Performed at [facility], [address], [place], [place] [address]    Special Requests  Final    BOTTLES DRAWN AEROBIC AND ANAEROBIC Blood Culture results may not be optimal due to an inadequate volume of blood received in culture bottles Performed at [facility], [address] [place], [place] [address]    Culture   Final    NO GROWTH 3 DAYS Performed at [facility], [address], [place], [place] [address]    Report Status PENDING  Incomplete  Culture, blood (routine x 2) Call MD if unable to obtain prior to antibiotics being given     Status: None (Preliminary result)   Collection Time: 07/12/23  5:15 PM   Specimen: BLOOD  Result Value Ref Range Status   Specimen Description   Final    BLOOD BLOOD LEFT ARM AEROBIC BOTTLE ONLY ANAEROBIC BOTTLE ONLY Performed at [facility], [address], [place], [place] [address]    Special Requests   Final    BOTTLES DRAWN AEROBIC AND ANAEROBIC Blood Culture results may not be optimal due to an inadequate volume of blood received in culture bottles Performed at [facility], [address], [place], [place] [address]    Culture   Final    NO GROWTH 3 DAYS Performed at [facility], [address] [place], [place] [address]    Report Status PENDING  Incomplete      Labs: BNP (last 3 results) Recent Labs    02/13/23 1951 06/13/23 1136 07/12/23 1014  BNP 62.0 120.6* 149.7*   Basic Metabolic Panel: Recent Labs  Lab 07/12/23 1014 07/13/23 0544 07/14/23 0547  NA 136 137 134*  K 3.8 4.2 4.3  CL 103 104 104  CO2 26 27 23   GLUCOSE 175* 208* 192*  BUN 19 21 24*  CREATININE 0.74 0.66 0.90  CALCIUM 8.5* 8.7* 8.6*   Liver Function Tests: Recent Labs  Lab 07/12/23 1014  AST 27  ALT 18  ALKPHOS 61  BILITOT 0.3  PROT 6.6  ALBUMIN 3.2*   No results for input(s): "LIPASE", "AMYLASE" in the last 168 hours. No results for input(s): "AMMONIA" in the last 168 hours. CBC: Recent Labs  Lab 07/12/23 1014 07/13/23 0544 07/14/23 0547  WBC 5.3 4.6 4.9  NEUTROABS 3.2  --  2.8  HGB 9.7* 8.7* 8.6*  HCT 32.3* 28.8* 29.2*  MCV 97.9 99.0 99.7  PLT 256 223 171   Cardiac Enzymes: No results for input(s): "CKTOTAL", "CKMB", "CKMBINDEX", "TROPONINI" in the last 168 hours. BNP: Invalid input(s): "POCBNP" CBG: No results for input(s): "GLUCAP" in the last 168 hours. D-Dimer No results for input(s): "DDIMER" in the last 72 hours. Hgb A1c No results for input(s): "HGBA1C" in the last 72 hours. Lipid Profile No results for input(s): "CHOL", "HDL", "LDLCALC", "TRIG", "CHOLHDL", "LDLDIRECT" in the last 72 hours. Thyroid function studies No results for input(s): "TSH", "T4TOTAL", "T3FREE", "THYROIDAB" in the last 72 hours.  Invalid input(s): "FREET3" Anemia work up No results for input(s): "VITAMINB12", "FOLATE", "FERRITIN", "TIBC", "IRON", "RETICCTPCT" in the last 72 hours. Urinalysis    Component Value Date/Time   COLORURINE YELLOW 07/12/2023 1147   APPEARANCEUR CLOUDY (A) 07/12/2023 1147   APPEARANCEUR Hazy (A) 05/13/2023 1426   LABSPEC 1.018 07/12/2023 1147   PHURINE 6.0 07/12/2023 1147   GLUCOSEU 50 (A) 07/12/2023 1147   HGBUR SMALL (A) 07/12/2023 1147   BILIRUBINUR NEGATIVE 07/12/2023 1147   BILIRUBINUR Negative 05/13/2023 1426    KETONESUR NEGATIVE 07/12/2023 1147   PROTEINUR 100 (A) 07/12/2023 1147  UROBILINOGEN 1.0 02/12/2015 1105   NITRITE NEGATIVE 07/12/2023 1147   LEUKOCYTESUR LARGE (A) 07/12/2023 1147   Sepsis Labs Recent Labs  Lab 07/12/23 1014 07/13/23 0544 07/14/23 0547  WBC 5.3 4.6 4.9   Microbiology Recent Results (from the past 240 hours)  Resp panel by RT-PCR (RSV, Flu A&B, Covid)     Status: None   Collection Time: 07/12/23 11:16 AM   Specimen: Nasal Swab  Result Value Ref Range Status   SARS Coronavirus 2 by RT PCR NEGATIVE NEGATIVE Final    Comment: (NOTE) SARS-CoV-2 target nucleic acids are NOT DETECTED.  The SARS-CoV-2 RNA is generally detectable in upper respiratory specimens during the acute phase of infection. The lowest concentration of SARS-CoV-2 viral copies this assay can detect is 138 copies/mL. A negative result does not preclude SARS-Cov-2 infection and should not be used as the sole basis for treatment or other patient management decisions. A negative result may occur with  improper specimen collection/handling, submission of specimen other than nasopharyngeal swab, presence of viral mutation(s) within the areas targeted by this assay, and inadequate number of viral copies(<138 copies/mL). A negative result must be combined with clinical observations, patient history, and epidemiological information. The expected result is Negative.  Fact Sheet for Patients:  BloggerCourse.com  Fact Sheet for Healthcare Providers:  SeriousBroker.it  This test is no t yet approved or cleared by the [place] FDA and  has been authorized for detection and/or diagnosis of SARS-CoV-2 by FDA under an Emergency Use Authorization (EUA). This EUA will remain  in effect (meaning this test can be used) for the duration of the COVID-19 declaration under Section 564(b)(1) of the Act, 21 U.S.C.section 360bbb-3(b)(1), unless the authorization  is terminated  or revoked sooner.       Influenza A by PCR NEGATIVE NEGATIVE Final   Influenza B by PCR NEGATIVE NEGATIVE Final    Comment: (NOTE) The Xpert Xpress SARS-CoV-2/FLU/RSV plus assay is intended as an aid in the diagnosis of influenza from Nasopharyngeal swab specimens and should not be used as a sole basis for treatment. Nasal washings and aspirates are unacceptable for Xpert Xpress SARS-CoV-2/FLU/RSV testing.  Fact Sheet for Patients: BloggerCourse.com  Fact Sheet for Healthcare Providers: SeriousBroker.it  This test is not yet approved or cleared by the [place] FDA and has been authorized for detection and/or diagnosis of SARS-CoV-2 by FDA under an Emergency Use Authorization (EUA). This EUA will remain in effect (meaning this test can be used) for the duration of the COVID-19 declaration under Section 564(b)(1) of the Act, 21 U.S.C. section 360bbb-3(b)(1), unless the authorization is terminated or revoked.     Resp Syncytial Virus by PCR NEGATIVE NEGATIVE Final    Comment: (NOTE) Fact Sheet for Patients: BloggerCourse.com  Fact Sheet for Healthcare Providers: SeriousBroker.it  This test is not yet approved or cleared by the [place] FDA and has been authorized for detection and/or diagnosis of SARS-CoV-2 by FDA under an Emergency Use Authorization (EUA). This EUA will remain in effect (meaning this test can be used) for the duration of the COVID-19 declaration under Section 564(b)(1) of the Act, 21 U.S.C. section 360bbb-3(b)(1), unless the authorization is terminated or revoked.  Performed at [facility], [address], [place], [place] [address]   Urine Culture     Status: Abnormal   Collection Time: 07/12/23 11:47 AM   Specimen: Urine, Random  Result Value Ref Range Status   Specimen Description   Final    URINE,  RANDOM Performed at [facility], [address] [place], [place] [address]    Special Requests   Final    NONE Reflexed from [id number] Performed at [facility], [address], [place], [place] [address]    Culture (A)  Final    <10,000 COLONIES/mL INSIGNIFICANT GROWTH Performed at [facility], [address] [place], [place] [address]    Report Status 07/13/2023 FINAL  Final  Expectorated Sputum Assessment w Gram Stain, Rflx to Resp Cult     Status: None   Collection Time: 07/12/23  2:44 PM   Specimen: Expectorated Sputum  Result Value Ref Range Status   Specimen Description EXPECTORATED SPUTUM  Final   Special Requests NONE  Final   Sputum evaluation   Final    Sputum specimen not acceptable for testing.  Please recollect.   [facility], [place] @ 1825 07/12/23 CAL Performed at [facility], [address] [place], [place] [address]    Report Status 07/12/2023 FINAL  Final  Culture, blood (routine x 2) Call MD if unable to obtain prior to antibiotics being given     Status: None (Preliminary result)   Collection Time: 07/12/23  5:04 PM   Specimen: BLOOD  Result Value Ref Range Status   Specimen Description   Final    BLOOD BLOOD LEFT ARM AEROBIC BOTTLE ONLY Performed at [facility], [address], [place], [place] [address]    Special Requests   Final    BOTTLES DRAWN AEROBIC AND ANAEROBIC Blood Culture results may not be optimal due to an inadequate volume of blood received in culture bottles Performed at [facility], [address], [place], [place] [address]    Culture   Final    NO GROWTH 3 DAYS Performed at [facility], [address], [place], [place] [address]    Report Status PENDING  Incomplete  Culture, blood (routine x 2) Call MD if unable to obtain prior to antibiotics being given     Status: None (Preliminary result)   Collection Time: 07/12/23  5:15 PM    Specimen: BLOOD  Result Value Ref Range Status   Specimen Description   Final    BLOOD BLOOD LEFT ARM AEROBIC BOTTLE ONLY ANAEROBIC BOTTLE ONLY Performed at [facility], [address], [place], [place] [address]    Special Requests   Final    BOTTLES DRAWN AEROBIC AND ANAEROBIC Blood Culture results may not be optimal due to an inadequate volume of blood received in culture bottles Performed at [facility], [address], [place], [place] [address]    Culture   Final    NO GROWTH 3 DAYS Performed at [facility], [address] [place], [place] [address]    Report Status PENDING  Incomplete     Time coordinating discharge: 35 minutes  SIGNED:   Audria Leather, MD  [facility] 07/16/2023, 8:21 AM

## 2023-07-17 LAB — CULTURE, BLOOD (ROUTINE X 2)
Culture: NO GROWTH
Culture: NO GROWTH

## 2023-07-23 NOTE — Telephone Encounter (Signed)
 Appointment cancelled & labs discontinued.

## 2023-07-23 NOTE — Telephone Encounter (Signed)
 Archer Kobs, thank you for the update. Yes, pls cancel follow up appt with Dr. Venice Gillis.  Dr. Gupta FYI

## 2023-08-23 ENCOUNTER — Inpatient Hospital Stay (HOSPITAL_COMMUNITY)
Admission: EM | Admit: 2023-08-23 | Discharge: 2023-08-26 | DRG: 103 | Disposition: A | Discharge status: Hospice | Attending: Internal Medicine | Admitting: Internal Medicine

## 2023-08-23 ENCOUNTER — Emergency Department (HOSPITAL_COMMUNITY)

## 2023-08-23 ENCOUNTER — Encounter (HOSPITAL_COMMUNITY): Payer: Self-pay

## 2023-08-23 ENCOUNTER — Other Ambulatory Visit: Payer: Self-pay

## 2023-08-23 DIAGNOSIS — E1122 Type 2 diabetes mellitus with diabetic chronic kidney disease: Secondary | ICD-10-CM | POA: Diagnosis present

## 2023-08-23 DIAGNOSIS — Z9981 Dependence on supplemental oxygen: Secondary | ICD-10-CM

## 2023-08-23 DIAGNOSIS — N3000 Acute cystitis without hematuria: Secondary | ICD-10-CM | POA: Diagnosis present

## 2023-08-23 DIAGNOSIS — Z7951 Long term (current) use of inhaled steroids: Secondary | ICD-10-CM

## 2023-08-23 DIAGNOSIS — E66813 Obesity, class 3: Secondary | ICD-10-CM | POA: Diagnosis present

## 2023-08-23 DIAGNOSIS — E119 Type 2 diabetes mellitus without complications: Secondary | ICD-10-CM

## 2023-08-23 DIAGNOSIS — R339 Retention of urine, unspecified: Secondary | ICD-10-CM | POA: Diagnosis present

## 2023-08-23 DIAGNOSIS — I5032 Chronic diastolic (congestive) heart failure: Secondary | ICD-10-CM | POA: Diagnosis present

## 2023-08-23 DIAGNOSIS — Z515 Encounter for palliative care: Secondary | ICD-10-CM

## 2023-08-23 DIAGNOSIS — D649 Anemia, unspecified: Secondary | ICD-10-CM

## 2023-08-23 DIAGNOSIS — J189 Pneumonia, unspecified organism: Principal | ICD-10-CM | POA: Diagnosis present

## 2023-08-23 DIAGNOSIS — J9621 Acute and chronic respiratory failure with hypoxia: Secondary | ICD-10-CM | POA: Insufficient documentation

## 2023-08-23 DIAGNOSIS — Z79899 Other long term (current) drug therapy: Secondary | ICD-10-CM

## 2023-08-23 DIAGNOSIS — G2581 Restless legs syndrome: Secondary | ICD-10-CM | POA: Diagnosis present

## 2023-08-23 DIAGNOSIS — Z7984 Long term (current) use of oral hypoglycemic drugs: Secondary | ICD-10-CM

## 2023-08-23 DIAGNOSIS — Z885 Allergy status to narcotic agent status: Secondary | ICD-10-CM

## 2023-08-23 DIAGNOSIS — Z90710 Acquired absence of both cervix and uterus: Secondary | ICD-10-CM

## 2023-08-23 DIAGNOSIS — R8271 Bacteriuria: Secondary | ICD-10-CM | POA: Diagnosis present

## 2023-08-23 DIAGNOSIS — F39 Unspecified mood [affective] disorder: Secondary | ICD-10-CM | POA: Diagnosis present

## 2023-08-23 DIAGNOSIS — Z96653 Presence of artificial knee joint, bilateral: Secondary | ICD-10-CM | POA: Diagnosis present

## 2023-08-23 DIAGNOSIS — Z87891 Personal history of nicotine dependence: Secondary | ICD-10-CM

## 2023-08-23 DIAGNOSIS — R0603 Acute respiratory distress: Secondary | ICD-10-CM | POA: Diagnosis present

## 2023-08-23 DIAGNOSIS — D631 Anemia in chronic kidney disease: Secondary | ICD-10-CM | POA: Diagnosis present

## 2023-08-23 DIAGNOSIS — F419 Anxiety disorder, unspecified: Secondary | ICD-10-CM | POA: Diagnosis present

## 2023-08-23 DIAGNOSIS — Z9842 Cataract extraction status, left eye: Secondary | ICD-10-CM

## 2023-08-23 DIAGNOSIS — Z9109 Other allergy status, other than to drugs and biological substances: Secondary | ICD-10-CM

## 2023-08-23 DIAGNOSIS — G43909 Migraine, unspecified, not intractable, without status migrainosus: Secondary | ICD-10-CM | POA: Diagnosis not present

## 2023-08-23 DIAGNOSIS — E1169 Type 2 diabetes mellitus with other specified complication: Secondary | ICD-10-CM

## 2023-08-23 DIAGNOSIS — M542 Cervicalgia: Secondary | ICD-10-CM | POA: Diagnosis present

## 2023-08-23 DIAGNOSIS — J449 Chronic obstructive pulmonary disease, unspecified: Secondary | ICD-10-CM | POA: Diagnosis present

## 2023-08-23 DIAGNOSIS — Z794 Long term (current) use of insulin: Secondary | ICD-10-CM

## 2023-08-23 DIAGNOSIS — R519 Headache, unspecified: Secondary | ICD-10-CM | POA: Diagnosis present

## 2023-08-23 DIAGNOSIS — J440 Chronic obstructive pulmonary disease with acute lower respiratory infection: Secondary | ICD-10-CM | POA: Diagnosis present

## 2023-08-23 DIAGNOSIS — Z6841 Body Mass Index (BMI) 40.0 and over, adult: Secondary | ICD-10-CM

## 2023-08-23 DIAGNOSIS — G4733 Obstructive sleep apnea (adult) (pediatric): Secondary | ICD-10-CM | POA: Diagnosis present

## 2023-08-23 DIAGNOSIS — Z8249 Family history of ischemic heart disease and other diseases of the circulatory system: Secondary | ICD-10-CM

## 2023-08-23 DIAGNOSIS — I2510 Atherosclerotic heart disease of native coronary artery without angina pectoris: Secondary | ICD-10-CM | POA: Diagnosis present

## 2023-08-23 DIAGNOSIS — I130 Hypertensive heart and chronic kidney disease with heart failure and stage 1 through stage 4 chronic kidney disease, or unspecified chronic kidney disease: Secondary | ICD-10-CM | POA: Diagnosis present

## 2023-08-23 DIAGNOSIS — Z9841 Cataract extraction status, right eye: Secondary | ICD-10-CM

## 2023-08-23 DIAGNOSIS — Z66 Do not resuscitate: Secondary | ICD-10-CM | POA: Diagnosis present

## 2023-08-23 DIAGNOSIS — E782 Mixed hyperlipidemia: Secondary | ICD-10-CM | POA: Diagnosis present

## 2023-08-23 DIAGNOSIS — N3289 Other specified disorders of bladder: Secondary | ICD-10-CM | POA: Diagnosis present

## 2023-08-23 DIAGNOSIS — J431 Panlobular emphysema: Secondary | ICD-10-CM | POA: Diagnosis present

## 2023-08-23 DIAGNOSIS — N1831 Chronic kidney disease, stage 3a: Secondary | ICD-10-CM | POA: Diagnosis present

## 2023-08-23 DIAGNOSIS — I480 Paroxysmal atrial fibrillation: Secondary | ICD-10-CM | POA: Diagnosis present

## 2023-08-23 DIAGNOSIS — Z961 Presence of intraocular lens: Secondary | ICD-10-CM | POA: Diagnosis present

## 2023-08-23 DIAGNOSIS — Z9049 Acquired absence of other specified parts of digestive tract: Secondary | ICD-10-CM

## 2023-08-23 DIAGNOSIS — E1140 Type 2 diabetes mellitus with diabetic neuropathy, unspecified: Secondary | ICD-10-CM | POA: Diagnosis present

## 2023-08-23 LAB — URINALYSIS, W/ REFLEX TO CULTURE (INFECTION SUSPECTED)
Bilirubin Urine: NEGATIVE
Glucose, UA: NEGATIVE mg/dL
Ketones, ur: NEGATIVE mg/dL
Nitrite: NEGATIVE
Protein, ur: NEGATIVE mg/dL
Specific Gravity, Urine: 1.009 (ref 1.005–1.030)
WBC, UA: >50 WBC/hpf (ref 0–5)
pH: 5 (ref 5.0–8.0)

## 2023-08-23 LAB — CBC WITH DIFFERENTIAL/PLATELET
Abs Immature Granulocytes: 0.05 10*3/uL (ref 0.00–0.07)
Basophils Absolute: 0.1 10*3/uL (ref 0.0–0.1)
Basophils Relative: 1 %
Eosinophils Absolute: 0.3 10*3/uL (ref 0.0–0.5)
Eosinophils Relative: 4 %
HCT: 36.6 % (ref 36.0–46.0)
Hemoglobin: 11.2 g/dL — ABNORMAL LOW (ref 12.0–15.0)
Immature Granulocytes: 1 %
Lymphocytes Relative: 36 %
Lymphs Abs: 2.8 10*3/uL (ref 0.7–4.0)
MCH: 29 pg (ref 26.0–34.0)
MCHC: 30.6 g/dL (ref 30.0–36.0)
MCV: 94.8 fL (ref 80.0–100.0)
Monocytes Absolute: 0.6 10*3/uL (ref 0.1–1.0)
Monocytes Relative: 7 %
Neutro Abs: 4 10*3/uL (ref 1.7–7.7)
Neutrophils Relative %: 51 %
Platelets: 259 10*3/uL (ref 150–400)
RBC: 3.86 MIL/uL — ABNORMAL LOW (ref 3.87–5.11)
RDW: 12.8 % (ref 11.5–15.5)
WBC: 7.7 10*3/uL (ref 4.0–10.5)
nRBC: 0 % (ref 0.0–0.2)

## 2023-08-23 LAB — PROTIME-INR
INR: 1 (ref 0.8–1.2)
Prothrombin Time: 13.1 s (ref 11.4–15.2)

## 2023-08-23 LAB — I-STAT CG4 LACTIC ACID, ED
Lactic Acid, Venous: 2 mmol/L (ref 0.5–1.9)
Lactic Acid, Venous: 1.9 mmol/L (ref 0.5–1.9)

## 2023-08-23 LAB — COMPREHENSIVE METABOLIC PANEL WITH GFR
ALT: 23 U/L (ref 0–44)
AST: 29 U/L (ref 15–41)
Albumin: 3.4 g/dL — ABNORMAL LOW (ref 3.5–5.0)
Alkaline Phosphatase: 84 U/L (ref 38–126)
Anion gap: 11 (ref 5–15)
BUN: 23 mg/dL (ref 8–23)
CO2: 28 mmol/L (ref 22–32)
Calcium: 9.3 mg/dL (ref 8.9–10.3)
Chloride: 97 mmol/L — ABNORMAL LOW (ref 98–111)
Creatinine, Ser: 1.01 mg/dL — ABNORMAL HIGH (ref 0.44–1.00)
GFR, Estimated: 54 mL/min — ABNORMAL LOW (ref >60)
Glucose, Bld: 163 mg/dL — ABNORMAL HIGH (ref 70–99)
Potassium: 3.7 mmol/L (ref 3.5–5.1)
Sodium: 136 mmol/L (ref 135–145)
Total Bilirubin: 0.5 mg/dL (ref 0.0–1.2)
Total Protein: 7.1 g/dL (ref 6.5–8.1)

## 2023-08-23 LAB — BRAIN NATRIURETIC PEPTIDE: B Natriuretic Peptide: 81.5 pg/mL (ref 0.0–100.0)

## 2023-08-23 LAB — TROPONIN I (HIGH SENSITIVITY)
Troponin I (High Sensitivity): 8 ng/L (ref <18)
Troponin I (High Sensitivity): 7 ng/L (ref <18)

## 2023-08-23 MED ORDER — PROCHLORPERAZINE EDISYLATE 10 MG/2ML IJ SOLN
10.0000 mg | Freq: Once | INTRAMUSCULAR | Status: AC
  Administered 2023-08-23: 10 mg via INTRAVENOUS
  Filled 2023-08-23: qty 2

## 2023-08-23 MED ORDER — IOHEXOL 350 MG/ML SOLN
75.0000 mL | Freq: Once | INTRAVENOUS | Status: AC | PRN
Start: 2023-08-23 — End: 2023-08-23
  Administered 2023-08-23: 75 mL via INTRAVENOUS

## 2023-08-23 MED ORDER — ACETAMINOPHEN 325 MG PO TABS
650.0000 mg | ORAL_TABLET | Freq: Once | ORAL | Status: AC
  Administered 2023-08-23: 650 mg via ORAL
  Filled 2023-08-23: qty 2

## 2023-08-23 MED ORDER — LIDOCAINE 5 % EX PTCH
1.0000 | MEDICATED_PATCH | Freq: Once | CUTANEOUS | Status: AC
  Administered 2023-08-23: 1 via TRANSDERMAL
  Filled 2023-08-23: qty 1

## 2023-08-23 MED ORDER — VANCOMYCIN HCL IN DEXTROSE 1-5 GM/200ML-% IV SOLN
1000.0000 mg | Freq: Once | INTRAVENOUS | Status: AC
  Administered 2023-08-24: 1000 mg via INTRAVENOUS
  Filled 2023-08-23: qty 200

## 2023-08-23 MED ORDER — SODIUM CHLORIDE 0.9 % IV SOLN
1.0000 g | Freq: Once | INTRAVENOUS | Status: AC
  Administered 2023-08-24: 1 g via INTRAVENOUS
  Filled 2023-08-23: qty 5

## 2023-08-23 MED ORDER — LACTATED RINGERS IV BOLUS
500.0000 mL | Freq: Once | INTRAVENOUS | Status: AC
  Administered 2023-08-23: 500 mL via INTRAVENOUS

## 2023-08-23 MED ORDER — SODIUM CHLORIDE 0.9 % IV SOLN
2.0000 g | Freq: Once | INTRAVENOUS | Status: DC
  Filled 2023-08-23: qty 10

## 2023-08-23 NOTE — ED Provider Notes (Signed)
 [place] EMERGENCY DEPARTMENT AT [facility] Provider Note   CSN: [id number] Arrival date & time: 08/23/23  1733     History  Chief Complaint  Patient presents with   Respiratory Distress   Weakness    Danielle [name] is a 89 y.o. female.  89 year old female presenting emergency department with generalized weakness, worsening dyspnea, and complaining of severe headache neck pain.  She is appropriate hospice patient secondary to frequent hospitalizations and significant comorbid disease.  Headache gradual onset.  Had some right lower face tingly earlier in the day that resolved.  No aphasia, facial droop or unilateral weakness.  Complaining of diffuse neck pain as well.  Family notes that she was having a hard time breathing earlier today as well and had to go up on her oxygen  typically on 2 L, but has gone up to 4.  She has been on Levaquin and has been a few days left   Weakness      Home Medications Prior to Admission medications   Medication Sig Start Date End Date Taking? Authorizing Provider  acetaminophen  (TYLENOL ) 500 MG tablet Take 500 mg by mouth as needed for mild pain (pain score 1-3) or headache.    [provider]  albuterol  (PROVENTIL  HFA;VENTOLIN  HFA) 108 (90 BASE) MCG/ACT inhaler Inhale 2 puffs into the lungs every 6 (six) hours as needed for wheezing or shortness of breath.     [provider]  amiodarone  (PACERONE ) 200 MG tablet Take 1 tablet (200 mg total) by mouth daily. 03/05/23   Krasowski, Robert J, MD  benzocaine  (ORAJEL) 10 % mucosal gel Use as directed in the mouth or throat 2 (two) times daily as needed (tongue ulcer). 07/16/23   Audria Leather, MD  budesonide -formoterol  (SYMBICORT ) 160-4.5 MCG/ACT inhaler Inhale 2 puffs into the lungs 2 (two) times daily.    [provider]  carboxymethylcellulose (REFRESH PLUS) 0.5 % SOLN Place 1 drop into both eyes daily as needed (dry eyes).    [provider]   citalopram  (CELEXA ) 20 MG tablet Take 20 mg by mouth daily. 10/12/16   [provider]  CRANBERRY PO Take 1 tablet by mouth daily.    [provider]  diclofenac  Sodium (VOLTAREN ) 1 % GEL APPLY 2 GRAMS TOPICALLY TO AFFECTED AREA THREE TIMES DAILY 08/08/19   Timothy Ford, MD  estradiol  (ESTRACE ) 0.1 MG/GM vaginal cream Apply 3 times weekly using a pea-sized amount on fingertip as directed 05/13/23   Scarlet Curly, MD  ferrous sulfate  325 (65 FE) MG EC tablet Take 325 mg by mouth daily with breakfast.    [provider]  furosemide  (LASIX ) 20 MG tablet Take 2 tablets (40 mg total) by mouth daily. 07/16/23   Audria Leather, MD  gabapentin  (NEURONTIN ) 100 MG capsule Take 1 capsule (100 mg total) by mouth 3 (three) times daily. 06/19/23   Lavada Porteous, DO  guaiFENesin  (MUCINEX ) 600 MG 12 hr tablet Take 1 tablet (600 mg total) by mouth 2 (two) times daily as needed for cough. 07/16/23   Audria Leather, MD  hydrocortisone -pramoxine (ANALPRAM -HC) 2.5-1 % rectal cream Place rectally 2 (two) times daily. 07/16/23   Audria Leather, MD  insulin  glargine (LANTUS ) 100 UNIT/ML injection Inject 0.1 mLs (10 Units total) into the skin daily. 06/20/23   Lavada Porteous, DO  lidocaine  (LIDODERM ) 5 % Place 1 patch onto the skin daily. Remove & Discard patch within 12 hours or as directed by MD 06/19/23   Lavada Porteous, DO  loratadine  (CLARITIN ) 10 MG tablet Take 10 mg by mouth daily.    [provider]  metFORMIN  (GLUCOPHAGE ) 1000 MG tablet Take 1 tablet (1,000 mg total) by mouth daily with breakfast. 06/20/23   Lavada Porteous, DO  Multiple Vitamin (MULTIVITAMIN) capsule Take 1 capsule by mouth daily.    [provider]  nitroGLYCERIN  (NITROSTAT ) 0.4 MG SL tablet Place 1 tablet (0.4 mg total) under the tongue every 5 (five) minutes as needed for chest pain. 08/22/20 08/21/27  Krasowski, Robert J, MD  nystatin  (MYCOSTATIN /NYSTOP ) powder Apply topically 2 (two) times daily.  Under right breast for intertrigo 06/19/23   Lavada Porteous, DO  [facility] ULTRA test strip 1 each by Other route daily. 06/16/23   [provider]  oxyCODONE  (OXY IR/ROXICODONE ) 5 MG immediate release tablet Take 1 tablet (5 mg total) by mouth every 4 (four) hours as needed for moderate pain (pain score 4-6). 07/16/23   Audria Leather, MD  pantoprazole  (PROTONIX ) 40 MG tablet Take 40 mg by mouth 2 (two) times daily. 11/14/16   [provider]  polyethylene glycol (MIRALAX  / GLYCOLAX ) 17 g packet Take 17 g by mouth daily. 06/20/23   Lavada Porteous, DO  potassium chloride  (MICRO-K ) 10 MEQ CR capsule Take 10 mEq by mouth daily. 11/20/14   [provider]  Probiotic Product (CULTRELLE KIDS IMMUNE DEFENSE PO) Take 1 Dose by mouth daily.    [provider]  rOPINIRole  (REQUIP ) 0.25 MG tablet Take 0.25 mg by mouth at bedtime.    [provider]  rosuvastatin  (CRESTOR ) 40 MG tablet Take 40 mg by mouth daily.    [provider]  senna (SENOKOT) 8.6 MG TABS tablet Take 2 tablets by mouth at bedtime.    [provider]  sitaGLIPtin (JANUVIA) 100 MG tablet Take 100 mg by mouth daily. 03/11/22   [provider]  Vitamin D , Ergocalciferol , (DRISDOL ) 1.25 MG (50000 UNIT) CAPS capsule Take 50,000 Units by mouth every Sunday. 02/10/23   [provider]      Allergies    Codeine, Penicillins, Xarelto  [rivaroxaban ], Levofloxacin, and Tape    Review of Systems   Review of Systems  Neurological:  Positive for weakness.    Physical Exam Updated Vital Signs BP 135/67   Pulse (!) 57   Temp 98.7 F (37.1 C) (Oral)   Resp 19   SpO2 98%  Physical Exam Vitals and nursing note reviewed.  Constitutional:      Appearance: She is obese.  HENT:     Head: Normocephalic.     Nose: Nose normal.     Mouth/Throat:     Mouth: Mucous membranes are moist.  Eyes:     Conjunctiva/sclera: Conjunctivae normal.  Cardiovascular:     Rate and  Rhythm: Normal rate and regular rhythm.  Pulmonary:     Effort: Pulmonary effort is normal.     Breath sounds: Rhonchi present.  Abdominal:     General: Abdomen is flat. There is no distension.     Tenderness: There is no abdominal tenderness. There is no guarding or rebound.  Musculoskeletal:        General: Normal range of motion.  Skin:    General: Skin is warm.     Capillary Refill: Capillary refill takes less than 2 seconds.  Neurological:     General: No focal deficit present.     Mental Status: She is alert and oriented to person, place, and time.     Cranial Nerves: No cranial  nerve deficit.     Sensory: No sensory deficit.     Motor: Weakness (globlal lower extremity.) present.     Coordination: Coordination normal.  Psychiatric:        Mood and Affect: Mood normal.        Behavior: Behavior normal.     ED Results / Procedures / Treatments   Labs (all labs ordered are listed, but only abnormal results are displayed) Labs Reviewed  COMPREHENSIVE METABOLIC PANEL WITH GFR - Abnormal; Notable for the following components:      Result Value   Chloride 97 (*)    Glucose, Bld 163 (*)    Creatinine, Ser 1.01 (*)    Albumin 3.4 (*)    GFR, Estimated 54 (*)    All other components within normal limits  CBC WITH DIFFERENTIAL/PLATELET - Abnormal; Notable for the following components:   RBC 3.86 (*)    Hemoglobin 11.2 (*)    All other components within normal limits  URINALYSIS, W/ REFLEX TO CULTURE (INFECTION SUSPECTED) - Abnormal; Notable for the following components:   Color, Urine STRAW (*)    APPearance HAZY (*)    Hgb urine dipstick MODERATE (*)    Leukocytes,Ua LARGE (*)    Bacteria, UA RARE (*)    All other components within normal limits  I-STAT CG4 LACTIC ACID, ED - Abnormal; Notable for the following components:   Lactic Acid, Venous 2.0 (*)    All other components within normal limits  CULTURE, BLOOD (ROUTINE X 2)  CULTURE, BLOOD (ROUTINE X 2)  URINE  CULTURE  PROTIME-INR  BRAIN NATRIURETIC PEPTIDE  I-STAT CG4 LACTIC ACID, ED  TROPONIN I (HIGH SENSITIVITY)  TROPONIN I (HIGH SENSITIVITY)    EKG EKG Interpretation Date/Time:  Sunday Aug 23 2023 17:58:56 EDT Ventricular Rate:  64 PR Interval:  235 QRS Duration:  111 QT Interval:  441 QTC Calculation: 455 R Axis:   -42  Text Interpretation: Sinus rhythm Prolonged PR interval Left ventricular hypertrophy Confirmed by Elise Guile [phone]) on 08/23/2023 6:14:58 PM  Radiology CT ANGIO HEAD NECK W WO CM Result Date: 08/23/2023 CLINICAL DATA:  Headache, sudden, severe EXAM: CT ANGIOGRAPHY HEAD AND NECK WITH AND WITHOUT CONTRAST TECHNIQUE: Multidetector CT imaging of the head and neck was performed using the standard protocol during bolus administration of intravenous contrast. Multiplanar CT image reconstructions and MIPs were obtained to evaluate the vascular anatomy. Carotid stenosis measurements (when applicable) are obtained utilizing NASCET criteria, using the distal internal carotid diameter as the denominator. RADIATION DOSE REDUCTION: This exam was performed according to the departmental dose-optimization program which includes automated exposure control, adjustment of the mA and/or kV according to patient size and/or use of iterative reconstruction technique. CONTRAST:  75mL OMNIPAQUE  IOHEXOL  350 MG/ML SOLN COMPARISON:  CT head March 15, 25. FINDINGS: CT HEAD FINDINGS Brain: No evidence of acute infarction, hemorrhage, hydrocephalus, extra-axial collection or mass lesion/mass effect. Vascular: See below. Skull: No acute fracture. Sinuses/Orbits: Clear sinuses.  No acute orbital findings. Other: No mastoid effusions. Review of the MIP images confirms the above findings CTA NECK FINDINGS Aortic arch: Aortic atherosclerosis. Great vessel origins are patent. Right carotid system: No evidence of dissection, stenosis (50% or greater), or occlusion. Left carotid system: No evidence of dissection,  stenosis (50% or greater), or occlusion. Vertebral arteries: Codominant. No evidence of dissection, stenosis (50% or greater), or occlusion. Skeleton: Negative. Other neck: Redemonstrated left parotid mass. Upper chest: Visualized lung apices are clear. Review of the MIP images confirms  the above findings CTA HEAD FINDINGS Anterior circulation: No significant stenosis, proximal occlusion, aneurysm, or vascular malformation. No aneurysm identified. Posterior circulation: No significant stenosis, proximal occlusion, aneurysm, or vascular malformation. No aneurysm identified. Venous sinuses: As permitted by contrast timing, patent. Review of the MIP images confirms the above findings IMPRESSION: 1. No evidence of acute intracranial abnormality. 2. No large vessel occlusion or proximal hemodynamically significant stenosis. 3. No aneurysm identified. 4. Redemonstrated left parotid mass. Electronically Signed   By: Stevenson Elbe M.D.   On: 08/23/2023 22:16   CT C-SPINE NO CHARGE Result Date: 08/23/2023 CLINICAL DATA:  Neck pain, acute, no red flags : headache, sudden, severe; neck pain, acute, no red flags Chief complaints: Respiratory distress, weakness EXAM: CT CERVICAL SPINE WITHOUT CONTRAST TECHNIQUE: Multidetector CT imaging of the cervical spine was performed without intravenous contrast. Multiplanar CT image reconstructions were also generated. RADIATION DOSE REDUCTION: This exam was performed according to the departmental dose-optimization program which includes automated exposure control, adjustment of the mA and/or kV according to patient size and/or use of iterative reconstruction technique. COMPARISON:  CT head and C-spine 06/13/2023. CT head 02/08/2018 FINDINGS: Alignment: Grade 1 anterolisthesis of C2 on C3, C3 on C4, C4 on C5. Skull base and vertebrae: C5 through C7 moderate degenerative changes of the spine. No associated severe osseous neural foraminal or central canal stenosis. No acute fracture.  No aggressive appearing focal osseous lesion or focal pathologic process. Soft tissues and spinal canal: No prevertebral fluid or swelling. No visible canal hematoma. Upper chest: Unremarkable. Other: Atherosclerotic plaque. A 1.6 x 1.4 cm well-circumscribed homogeneous left parotid gland lesion. IMPRESSION: 1. No acute displaced fracture or traumatic listhesis of the cervical spine. 2. A 1.6 x 1.4 cm well-circumscribed homogeneous left parotid gland lesion. 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 Recommend attention on follow-up. Electronically Signed   By: Morgane  Naveau M.D.   On: 08/23/2023 21:48   DG Chest Port 1 View Result Date: 08/23/2023 CLINICAL DATA:  Sepsis, hypotension EXAM: PORTABLE CHEST 1 VIEW COMPARISON:  07/12/2023 FINDINGS: Single frontal view of the chest was obtained with the patient rotated toward the left. Cardiac silhouette is enlarged. Increased density within the left lower chest obscures the left hemidiaphragm, consistent with consolidation and/or effusion. The right chest is clear. No pneumothorax. No acute bony abnormalities. IMPRESSION: 1. Increased density at the left lung base consistent with left lower lobe consolidation and/or effusion. 2. Stable enlarged cardiac silhouette. Electronically Signed   By: Bobbye Burrow M.D.   On: 08/23/2023 19:13    Procedures Procedures    Medications Ordered in ED Medications  lidocaine  (LIDODERM ) 5 % 1 patch (1 patch Transdermal Patch Applied 08/23/23 2132)  aztreonam (AZACTAM) 2 g in sodium chloride  0.9 % 100 mL IVPB (has no administration in time range)  vancomycin (VANCOCIN) IVPB 1000 mg/200 mL premix (has no administration in time range)  lactated ringers  bolus 500 mL (0 mLs Intravenous Stopped 08/23/23 2254)  iohexol  (OMNIPAQUE ) 350 MG/ML injection 75 mL (75 mLs Intravenous Contrast Given 08/23/23 2104)  prochlorperazine (COMPAZINE) injection 10 mg (10 mg Intravenous Given 08/23/23 2131)  acetaminophen  (TYLENOL )  tablet 650 mg (650 mg Oral Given 08/23/23 2131)    ED Course/ Medical Decision Making/ A&P Clinical Course as of 08/23/23 2353  Sun Aug 23, 2023  1815 Echo in 2023  [TY]  2201 CT ANGIO HEAD NECK W WO CM I do not appreciate obvious intracranial hemorrhage or pathology.  No obvious dissection. [TY]  2206 CT C-SPINE NO  CHARGE IMPRESSION: 1. No acute displaced fracture or traumatic listhesis of the cervical spine. 2. A 1.6 x 1.4 cm well-circumscribed homogeneous left parotid gland lesion. 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 Recommend attention on follow-up.   [TY]  2219 CT ANGIO HEAD NECK W WO CM IMPRESSION: 1. No evidence of acute intracranial abnormality. 2. No large vessel occlusion or proximal hemodynamically significant stenosis. 3. No aneurysm identified. 4. Redemonstrated left parotid mass   [TY]    Clinical Course User Index [TY] Rolinda Climes, DO                                 Medical Decision Making This is an 89 year old with significant comorbidities to include COPD on 2 L, diastolic CHF, hypertension CAD, CKD, diabetes, recent admission for GI bleed.  She is afebrile nontachycardic, initially had soft blood pressure, improved with small fluid bolus.  She is maintaining oxygen  saturation on 4 L nasal cannula, does not appear in significant respiratory distress.  Complaining of headache and neck pain.  No focal deficits on exam.  Family seemingly overwhelmed with caring for patient at home and asking for further resources despite being on hospice/palliative care.  There asking about placement.  Her CTA was negative, C-spine negative for acute fracture.  Did show a small parotid mass.  Family made aware.  Her screening labs with urinary tract infection, no leukocytosis to suggest systemic infection.  Stable anemia.  No significant metabolic derangements.  No transaminitis to suggest hepatobiliary disease.  Lactate normal; sepsis administered ischemia  unlikely.  Troponin negative, EKG without ST segment changes indicate ischemia and BNP not elevated.  Not likely ACS or acute decompensated heart failure.  Started on antibiotics.  Will admit for pneumonia and urinary tract infection given her co-morbidities and increasing oxygen  requirements.   Amount and/or Complexity of Data Reviewed Labs: ordered. Radiology: ordered. Decision-making details documented in ED Course.  Risk OTC drugs. Prescription drug management. Decision regarding hospitalization.         Final Clinical Impression(s) / ED Diagnoses Final diagnoses:  Pneumonia of left lower lobe due to infectious organism  Acute cystitis without hematuria  Bad headache    Rx / DC Orders ED Discharge Orders     None         Rolinda Climes, DO 08/23/23 2353

## 2023-08-24 ENCOUNTER — Encounter (HOSPITAL_COMMUNITY): Payer: Self-pay | Admitting: Internal Medicine

## 2023-08-24 DIAGNOSIS — Z7189 Other specified counseling: Secondary | ICD-10-CM

## 2023-08-24 DIAGNOSIS — Z6841 Body Mass Index (BMI) 40.0 and over, adult: Secondary | ICD-10-CM | POA: Diagnosis not present

## 2023-08-24 DIAGNOSIS — Z794 Long term (current) use of insulin: Secondary | ICD-10-CM | POA: Diagnosis not present

## 2023-08-24 DIAGNOSIS — K118 Other diseases of salivary glands: Secondary | ICD-10-CM | POA: Insufficient documentation

## 2023-08-24 DIAGNOSIS — G43909 Migraine, unspecified, not intractable, without status migrainosus: Secondary | ICD-10-CM | POA: Diagnosis present

## 2023-08-24 DIAGNOSIS — Z7951 Long term (current) use of inhaled steroids: Secondary | ICD-10-CM | POA: Diagnosis not present

## 2023-08-24 DIAGNOSIS — E66813 Obesity, class 3: Secondary | ICD-10-CM

## 2023-08-24 DIAGNOSIS — J9611 Chronic respiratory failure with hypoxia: Secondary | ICD-10-CM | POA: Diagnosis not present

## 2023-08-24 DIAGNOSIS — R339 Retention of urine, unspecified: Secondary | ICD-10-CM | POA: Diagnosis present

## 2023-08-24 DIAGNOSIS — Z66 Do not resuscitate: Secondary | ICD-10-CM | POA: Diagnosis present

## 2023-08-24 DIAGNOSIS — I480 Paroxysmal atrial fibrillation: Secondary | ICD-10-CM

## 2023-08-24 DIAGNOSIS — J9601 Acute respiratory failure with hypoxia: Secondary | ICD-10-CM | POA: Diagnosis not present

## 2023-08-24 DIAGNOSIS — E1122 Type 2 diabetes mellitus with diabetic chronic kidney disease: Secondary | ICD-10-CM | POA: Diagnosis present

## 2023-08-24 DIAGNOSIS — N3000 Acute cystitis without hematuria: Secondary | ICD-10-CM | POA: Diagnosis present

## 2023-08-24 DIAGNOSIS — G2581 Restless legs syndrome: Secondary | ICD-10-CM | POA: Diagnosis present

## 2023-08-24 DIAGNOSIS — E782 Mixed hyperlipidemia: Secondary | ICD-10-CM | POA: Diagnosis present

## 2023-08-24 DIAGNOSIS — R52 Pain, unspecified: Secondary | ICD-10-CM | POA: Diagnosis not present

## 2023-08-24 DIAGNOSIS — J189 Pneumonia, unspecified organism: Secondary | ICD-10-CM

## 2023-08-24 DIAGNOSIS — J440 Chronic obstructive pulmonary disease with acute lower respiratory infection: Secondary | ICD-10-CM | POA: Diagnosis present

## 2023-08-24 DIAGNOSIS — J431 Panlobular emphysema: Secondary | ICD-10-CM | POA: Diagnosis present

## 2023-08-24 DIAGNOSIS — F39 Unspecified mood [affective] disorder: Secondary | ICD-10-CM | POA: Diagnosis present

## 2023-08-24 DIAGNOSIS — R338 Other retention of urine: Secondary | ICD-10-CM | POA: Diagnosis not present

## 2023-08-24 DIAGNOSIS — J9621 Acute and chronic respiratory failure with hypoxia: Secondary | ICD-10-CM

## 2023-08-24 DIAGNOSIS — I5032 Chronic diastolic (congestive) heart failure: Secondary | ICD-10-CM

## 2023-08-24 DIAGNOSIS — D631 Anemia in chronic kidney disease: Secondary | ICD-10-CM | POA: Diagnosis present

## 2023-08-24 DIAGNOSIS — E1140 Type 2 diabetes mellitus with diabetic neuropathy, unspecified: Secondary | ICD-10-CM | POA: Diagnosis present

## 2023-08-24 DIAGNOSIS — R0603 Acute respiratory distress: Secondary | ICD-10-CM | POA: Diagnosis present

## 2023-08-24 DIAGNOSIS — F419 Anxiety disorder, unspecified: Secondary | ICD-10-CM | POA: Diagnosis present

## 2023-08-24 DIAGNOSIS — Z515 Encounter for palliative care: Secondary | ICD-10-CM | POA: Diagnosis not present

## 2023-08-24 DIAGNOSIS — I2510 Atherosclerotic heart disease of native coronary artery without angina pectoris: Secondary | ICD-10-CM | POA: Diagnosis present

## 2023-08-24 DIAGNOSIS — R519 Headache, unspecified: Secondary | ICD-10-CM | POA: Diagnosis not present

## 2023-08-24 DIAGNOSIS — N1831 Chronic kidney disease, stage 3a: Secondary | ICD-10-CM | POA: Diagnosis present

## 2023-08-24 DIAGNOSIS — J449 Chronic obstructive pulmonary disease, unspecified: Secondary | ICD-10-CM | POA: Diagnosis not present

## 2023-08-24 DIAGNOSIS — D649 Anemia, unspecified: Secondary | ICD-10-CM

## 2023-08-24 DIAGNOSIS — I130 Hypertensive heart and chronic kidney disease with heart failure and stage 1 through stage 4 chronic kidney disease, or unspecified chronic kidney disease: Secondary | ICD-10-CM | POA: Diagnosis present

## 2023-08-24 LAB — BASIC METABOLIC PANEL WITH GFR
Anion gap: 10 (ref 5–15)
BUN: 19 mg/dL (ref 8–23)
CO2: 28 mmol/L (ref 22–32)
Calcium: 8.9 mg/dL (ref 8.9–10.3)
Chloride: 95 mmol/L — ABNORMAL LOW (ref 98–111)
Creatinine, Ser: 1.06 mg/dL — ABNORMAL HIGH (ref 0.44–1.00)
GFR, Estimated: 51 mL/min — ABNORMAL LOW (ref >60)
Glucose, Bld: 191 mg/dL — ABNORMAL HIGH (ref 70–99)
Potassium: 3.4 mmol/L — ABNORMAL LOW (ref 3.5–5.1)
Sodium: 133 mmol/L — ABNORMAL LOW (ref 135–145)

## 2023-08-24 LAB — CBC
HCT: 33.1 % — ABNORMAL LOW (ref 36.0–46.0)
Hemoglobin: 10.2 g/dL — ABNORMAL LOW (ref 12.0–15.0)
MCH: 29.1 pg (ref 26.0–34.0)
MCHC: 30.8 g/dL (ref 30.0–36.0)
MCV: 94.6 fL (ref 80.0–100.0)
Platelets: 238 10*3/uL (ref 150–400)
RBC: 3.5 MIL/uL — ABNORMAL LOW (ref 3.87–5.11)
RDW: 12.9 % (ref 11.5–15.5)
WBC: 8.3 10*3/uL (ref 4.0–10.5)
nRBC: 0 % (ref 0.0–0.2)

## 2023-08-24 LAB — GLUCOSE, CAPILLARY
Glucose-Capillary: 168 mg/dL — ABNORMAL HIGH (ref 70–99)
Glucose-Capillary: 177 mg/dL — ABNORMAL HIGH (ref 70–99)
Glucose-Capillary: 142 mg/dL — ABNORMAL HIGH (ref 70–99)
Glucose-Capillary: 186 mg/dL — ABNORMAL HIGH (ref 70–99)

## 2023-08-24 LAB — PROCALCITONIN: Procalcitonin: <0.1 ng/mL

## 2023-08-24 MED ORDER — VANCOMYCIN HCL 1.25 G IV SOLR: 1250.0000 mg | INTRAVENOUS | Status: DC

## 2023-08-24 MED ORDER — LIDOCAINE 5 % EX PTCH
1.0000 | MEDICATED_PATCH | CUTANEOUS | Status: DC
  Administered 2023-08-24 – 2023-08-25 (×2): 1 via TRANSDERMAL
  Filled 2023-08-24 (×2): qty 1

## 2023-08-24 MED ORDER — PROMETHAZINE HCL 25 MG/ML IJ SOLN: 25.0000 mg | Freq: Once | INTRAMUSCULAR | Status: DC | PRN

## 2023-08-24 MED ORDER — DICLOFENAC SODIUM 1 % EX GEL
4.0000 g | Freq: Four times a day (QID) | CUTANEOUS | Status: DC | PRN
  Filled 2023-08-24: qty 100

## 2023-08-24 MED ORDER — HYDROMORPHONE HCL 1 MG/ML IJ SOLN: 1.0000 mg | INTRAMUSCULAR | Status: DC | PRN

## 2023-08-24 MED ORDER — IPRATROPIUM-ALBUTEROL 0.5-2.5 (3) MG/3ML IN SOLN
3.0000 mL | RESPIRATORY_TRACT | Status: DC
  Administered 2023-08-24 – 2023-08-26 (×16): 3 mL via RESPIRATORY_TRACT
  Filled 2023-08-24 (×17): qty 3

## 2023-08-24 MED ORDER — AMIODARONE HCL 200 MG PO TABS
200.0000 mg | ORAL_TABLET | Freq: Every day | ORAL | Status: DC
  Administered 2023-08-24 – 2023-08-26 (×3): 200 mg via ORAL
  Filled 2023-08-24 (×3): qty 1

## 2023-08-24 MED ORDER — ALPRAZOLAM 0.5 MG PO TABS: 1.0000 mg | ORAL_TABLET | ORAL | Status: DC | PRN

## 2023-08-24 MED ORDER — GABAPENTIN 100 MG PO CAPS
100.0000 mg | ORAL_CAPSULE | Freq: Three times a day (TID) | ORAL | Status: DC
Start: 2023-08-24 — End: 2023-08-27
  Administered 2023-08-24 – 2023-08-26 (×9): 100 mg via ORAL
  Filled 2023-08-24 (×9): qty 1

## 2023-08-24 MED ORDER — ROPINIROLE HCL 0.25 MG PO TABS
0.2500 mg | ORAL_TABLET | Freq: Every day | ORAL | Status: DC
  Administered 2023-08-24 – 2023-08-25 (×2): 0.25 mg via ORAL
  Filled 2023-08-24 (×3): qty 1

## 2023-08-24 MED ORDER — PROMETHAZINE HCL 25 MG PO TABS
25.0000 mg | ORAL_TABLET | Freq: Once | ORAL | Status: AC | PRN
  Administered 2023-08-24: 25 mg via ORAL
  Filled 2023-08-24: qty 1

## 2023-08-24 MED ORDER — GLYCOPYRROLATE 1 MG PO TABS
1.0000 mg | ORAL_TABLET | Freq: Three times a day (TID) | ORAL | Status: DC
  Administered 2023-08-24 – 2023-08-26 (×6): 1 mg via ORAL
  Filled 2023-08-24 (×6): qty 1

## 2023-08-24 MED ORDER — SODIUM CHLORIDE 0.9 % IV SOLN
100.0000 mg | Freq: Two times a day (BID) | INTRAVENOUS | Status: DC
  Administered 2023-08-24: 100 mg via INTRAVENOUS
  Filled 2023-08-24 (×2): qty 100

## 2023-08-24 MED ORDER — FUROSEMIDE 40 MG PO TABS
40.0000 mg | ORAL_TABLET | Freq: Every day | ORAL | Status: DC
  Administered 2023-08-24 – 2023-08-26 (×3): 40 mg via ORAL
  Filled 2023-08-24 (×3): qty 1

## 2023-08-24 MED ORDER — ROSUVASTATIN CALCIUM 20 MG PO TABS
40.0000 mg | ORAL_TABLET | Freq: Every day | ORAL | Status: DC
  Administered 2023-08-26: 40 mg via ORAL
  Filled 2023-08-24 (×3): qty 2

## 2023-08-24 MED ORDER — IPRATROPIUM-ALBUTEROL 0.5-2.5 (3) MG/3ML IN SOLN: 3.0000 mL | RESPIRATORY_TRACT | Status: DC | PRN

## 2023-08-24 MED ORDER — SODIUM CHLORIDE 0.9 % IV SOLN
1.0000 g | Freq: Three times a day (TID) | INTRAVENOUS | Status: DC
  Administered 2023-08-24: 1 g via INTRAVENOUS
  Filled 2023-08-24: qty 5

## 2023-08-24 MED ORDER — ONDANSETRON HCL 4 MG/2ML IJ SOLN
4.0000 mg | Freq: Once | INTRAMUSCULAR | Status: AC
  Administered 2023-08-24: 4 mg via INTRAVENOUS
  Filled 2023-08-24: qty 2

## 2023-08-24 MED ORDER — INSULIN ASPART 100 UNIT/ML IJ SOLN
0.0000 [IU] | Freq: Three times a day (TID) | INTRAMUSCULAR | Status: DC
  Administered 2023-08-24 (×2): 2 [IU] via SUBCUTANEOUS
  Administered 2023-08-24: 1 [IU] via SUBCUTANEOUS
  Administered 2023-08-25: 3 [IU] via SUBCUTANEOUS
  Administered 2023-08-25 – 2023-08-26 (×5): 2 [IU] via SUBCUTANEOUS

## 2023-08-24 MED ORDER — ONDANSETRON HCL 4 MG/2ML IJ SOLN
4.0000 mg | Freq: Four times a day (QID) | INTRAMUSCULAR | Status: DC | PRN
  Administered 2023-08-24 – 2023-08-26 (×2): 4 mg via INTRAVENOUS
  Filled 2023-08-24 (×3): qty 2

## 2023-08-24 MED ORDER — CITALOPRAM HYDROBROMIDE 20 MG PO TABS
20.0000 mg | ORAL_TABLET | Freq: Every day | ORAL | Status: DC
  Administered 2023-08-24 – 2023-08-26 (×3): 20 mg via ORAL
  Filled 2023-08-24 (×3): qty 1

## 2023-08-24 MED ORDER — QUETIAPINE FUMARATE 200 MG PO TABS
200.0000 mg | ORAL_TABLET | Freq: Every day | ORAL | Status: DC
  Administered 2023-08-24: 200 mg via ORAL
  Filled 2023-08-24: qty 1

## 2023-08-24 MED ORDER — OXYCODONE HCL 5 MG PO TABS
10.0000 mg | ORAL_TABLET | ORAL | Status: DC | PRN
  Administered 2023-08-24: 10 mg via ORAL
  Filled 2023-08-24: qty 2

## 2023-08-24 MED ORDER — PANTOPRAZOLE SODIUM 40 MG PO TBEC
40.0000 mg | DELAYED_RELEASE_TABLET | Freq: Two times a day (BID) | ORAL | Status: DC
  Administered 2023-08-24 – 2023-08-26 (×6): 40 mg via ORAL
  Filled 2023-08-24 (×6): qty 1

## 2023-08-24 MED ORDER — OXYCODONE HCL 5 MG PO TABS
20.0000 mg | ORAL_TABLET | ORAL | Status: DC | PRN
  Administered 2023-08-24 – 2023-08-26 (×2): 20 mg via ORAL
  Filled 2023-08-24 (×2): qty 4

## 2023-08-24 MED ORDER — BUDESONIDE 0.25 MG/2ML IN SUSP
0.2500 mg | Freq: Two times a day (BID) | RESPIRATORY_TRACT | Status: DC
  Administered 2023-08-24 – 2023-08-26 (×6): 0.25 mg via RESPIRATORY_TRACT
  Filled 2023-08-24 (×6): qty 2

## 2023-08-24 MED ORDER — SODIUM CHLORIDE 0.9 % IV SOLN
1.0000 g | INTRAVENOUS | Status: DC
  Filled 2023-08-24: qty 10

## 2023-08-24 MED ORDER — MIRABEGRON ER 25 MG PO TB24
50.0000 mg | ORAL_TABLET | Freq: Every day | ORAL | Status: DC
  Administered 2023-08-24 – 2023-08-25 (×2): 50 mg via ORAL
  Filled 2023-08-24 (×2): qty 2

## 2023-08-24 MED ORDER — ENOXAPARIN SODIUM 40 MG/0.4ML IJ SOSY
40.0000 mg | PREFILLED_SYRINGE | INTRAMUSCULAR | Status: DC
  Administered 2023-08-24 – 2023-08-26 (×3): 40 mg via SUBCUTANEOUS
  Filled 2023-08-24 (×3): qty 0.4

## 2023-08-24 NOTE — ED Notes (Signed)
 Floor 6 notified of pt's arrival.

## 2023-08-24 NOTE — Assessment & Plan Note (Signed)
 08-24-2023 stable. Euvolemic. Continue daily lasix  po.  08-25-2023 stable

## 2023-08-24 NOTE — Assessment & Plan Note (Signed)
 08-24-2023 on amiodarone .

## 2023-08-24 NOTE — Plan of Care (Signed)
  Problem: Education: Goal: Knowledge of General Education information will improve Description: Including pain rating scale, medication(s)/side effects and non-pharmacologic comfort measures Outcome: Progressing   Problem: Health Behavior/Discharge Planning: Goal: Ability to manage health-related needs will improve Outcome: Progressing   Problem: Clinical Measurements: Goal: Ability to maintain clinical measurements within normal limits will improve Outcome: Progressing Goal: Will remain free from infection Outcome: Progressing Goal: Diagnostic test results will improve Outcome: Progressing Goal: Cardiovascular complication will be avoided Outcome: Progressing   Problem: Activity: Goal: Risk for activity intolerance will decrease Outcome: Progressing   Problem: Nutrition: Goal: Adequate nutrition will be maintained Outcome: Progressing   Problem: Coping: Goal: Level of anxiety will decrease Outcome: Progressing   Problem: Elimination: Goal: Will not experience complications related to bowel motility Outcome: Progressing Goal: Will not experience complications related to urinary retention Outcome: Progressing   Problem: Pain Managment: Goal: General experience of comfort will improve and/or be controlled Outcome: Progressing   Problem: Safety: Goal: Ability to remain free from injury will improve Outcome: Progressing   Problem: Skin Integrity: Goal: Risk for impaired skin integrity will decrease Outcome: Progressing   Problem: Clinical Measurements: Goal: Respiratory complications will improve Outcome: Not Progressing

## 2023-08-24 NOTE — Plan of Care (Signed)

## 2023-08-24 NOTE — Progress Notes (Signed)
 Mobility Specialist - Progress Note   08/24/23 1038  Mobility  Activity Transferred to/from [facility]  Level of Assistance Contact guard assist, steadying assist  Assistive Device Front wheel walker  Distance Ambulated (ft) 2 ft  Activity Response Tolerated well  Mobility Referral Yes  Mobility visit 1 Mobility  Mobility Specialist Start Time (ACUTE ONLY) 1026  Mobility Specialist Stop Time (ACUTE ONLY) 1037  Mobility Specialist Time Calculation (min) (ACUTE ONLY) 11 min   Pt received in bed requesting assistance to [facility] for BM. Pt was modA from supine>sitting & modA from STS. No complaints during session. Instructed pt to press call bell when finished. RN notified. Pt to [facility] after session with all needs met.   [facility]

## 2023-08-24 NOTE — H&P (Addendum)
 History and Physical    Danielle [name]:[id number] DOB: [birth year] DOA: 08/23/2023  Patient coming from: Home.  Chief Complaint: Shortness of breath and headache.  HPI: Danielle [name] is a 89 y.o. female with history of chronic respiratory failure on 2 L oxygen  secondary to COPD, history of diastolic CHF, diabetes mellitus type 2, paroxysmal atrial fibrillation, hyperlipidemia was brought to the ER after patient was having persistent cough and shortness of breath and over the last two days has been having some occipital headache.  As per the patient's daughter patient has been having persistent productive cough for the last 10 days had one course of Z-Pak following which patient was placed on Levaquin despite which patient was still having cough productive of greenish sputum.  Last two days patient has been having neck and occipital headache.  Denies any weakness of extremities.  In the month of April 2025 patient was discharged home on hospice.  ED Course: In the ER patient was afebrile.  CT angiogram head and neck and CT C-spine were unremarkable except for benign looking left parotid mass.  Chest x-ray shows increased consolidation in the left lung concerning for pneumonia.  Was started on empiric antibiotics and admitted for further observation.  Patient's headache improved after patient was given Compazine in the ER.  Review of Systems: As per HPI, rest all negative.   Past Medical History:  Diagnosis Date   Anginal pain (HCC)    Arthritis    Asthma    At high risk for falls 11/11/2017   CHF (congestive heart failure) (HCC)    Chronic diastolic congestive heart failure (HCC) 11/11/2015   Chronic insomnia 10/24/2015   Chronic pain of both knees 05/14/2018   Chronic respiratory failure with hypoxia (HCC) 05/05/2017   CKD (chronic kidney disease) stage 3, GFR 30-59 ml/min (HCC) 05/07/2017   COPD (chronic obstructive pulmonary disease) (HCC)    Coronary artery disease     Coronary artery disease involving native coronary artery of native heart without angina pectoris 11/14/2014   Overview:  40% RCA a cardiac catheter in 2014   Current mild episode of major depressive disorder (HCC) 11/14/2014   Depression    DOE (dyspnea on exertion) 09/07/2018   Dysphagia 08/12/2018   Edema 08/12/2018   Elevated blood sugar 11/09/2015   Emphysema lung (HCC)    Flat feet, bilateral 05/30/2020   Gastroesophageal reflux disease without esophagitis 11/11/2017   GERD (gastroesophageal reflux disease)    Headache    History of hiatal hernia    History of kidney stones    Hyperlipidemia    Hypertension    Insomnia    Internal hemorrhoids 11/26/2018   Iron deficiency anemia 08/12/2018   Malaise and fatigue 08/14/2015   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/26/2018   Mesenteric mass 02/19/2015   Midsternal chest pain 11/11/2015   Mixed hyperlipidemia 11/14/2014   Morbid obesity (HCC) 09/07/2018   OSA (obstructive sleep apnea) 12/16/2019   Osteoporosis    Panlobular emphysema (HCC) 11/09/2015   Pneumonia 2013   PONV (postoperative nausea and vomiting)    Positive D-dimer 09/08/2018   Primary osteoarthritis involving multiple joints 08/14/2015   Restless legs syndrome 03/04/2018   Senile purpura (HCC) 08/12/2018   Shortness of breath dyspnea    with exertion   Stasis edema of left lower extremity 04/13/2018   Strain of right shoulder 08/12/2018   Type 2 diabetes mellitus with stage 3 chronic kidney disease (HCC) 08/14/2015   Unstable angina (HCC) 10/30/2018   Vitamin  B12 deficiency 04/13/2018   Vitamin D  deficiency 02/27/2016    Past Surgical History:  Procedure Laterality Date   ABDOMINAL HYSTERECTOMY     APPENDECTOMY     BREAST SURGERY  40 yrs. ago   growth removed in each breast-benign   CARDIAC CATHETERIZATION     12/14/12 LHC ([facility]): few mild stenotic areas max 40% of ectatic RCA o/w NL coronaries, EF 65%. Med tx.   CATARACT EXTRACTION W/ INTRAOCULAR LENS  IMPLANT,  BILATERAL Bilateral 15 yrs ago   CHOLECYSTECTOMY     COLONOSCOPY  04/22/2013   Diverticulosis in the sigmoid colon. Non bleeding internal hemorrhoids. Normal mucosa vascular pattern in the entire examined colon. Three 6 mm polyps n the descending colon. Resected and retrieved.    CYSTOSCOPY WITH RETROGRADE PYELOGRAM, URETEROSCOPY AND STENT PLACEMENT Right 02/14/2023   Procedure: CYSTOSCOPY WITH RETROGRADE PYELOGRAM, URETEROSCOPY AND STENT PLACEMENT;  Surgeon: Scarlet Curly, MD;  Location: [facility] OR;  Service: Urology;  Laterality: Right;   CYSTOSCOPY WITH RETROGRADE PYELOGRAM, URETEROSCOPY AND STENT PLACEMENT Right 03/17/2023   Procedure: CYSTOSCOPY WITH RETROGRADE PYELOGRAM, URETEROSCOPY AND STENT PLACEMENT;  Surgeon: Scarlet Curly, MD;  Location: [facility] ORS;  Service: Urology;  Laterality: Right;   FOOT SURGERY     LAPAROSCOPIC APPENDECTOMY N/A 02/19/2015   Procedure: EXCISION OF MESENTERIC MASS;  Surgeon: Lockie Rima, MD;  Location: [facility] OR;  Service: General;  Laterality: N/A;   REPLACEMENT TOTAL KNEE BILATERAL       reports that she quit smoking about 7 years ago. Her smoking use included cigarettes. She has never used smokeless tobacco. She reports that she does not drink alcohol  and does not use drugs.  Allergies  Allergen Reactions   Codeine Swelling   Penicillins Swelling   Xarelto  [Rivaroxaban ] Other (See Comments)    Causes bleeding in stool and urine   Levofloxacin Other (See Comments)    Other reaction(s): Malaise (intolerance)   Tape Rash    Family History  Problem Relation Age of Onset   Heart attack Mother     Prior to Admission medications   Medication Sig Start Date End Date Taking? Authorizing Provider  acetaminophen  (TYLENOL ) 500 MG tablet Take 500 mg by mouth as needed for mild pain (pain score 1-3) or headache.    [provider]  albuterol  (PROVENTIL  HFA;VENTOLIN  HFA) 108 (90 BASE) MCG/ACT inhaler Inhale 2 puffs into the lungs every 6 (six) hours as  needed for wheezing or shortness of breath.     [provider]  amiodarone  (PACERONE ) 200 MG tablet Take 1 tablet (200 mg total) by mouth daily. 03/05/23   Krasowski, Robert J, MD  benzocaine  (ORAJEL) 10 % mucosal gel Use as directed in the mouth or throat 2 (two) times daily as needed (tongue ulcer). 07/16/23   Audria Leather, MD  budesonide -formoterol  (SYMBICORT ) 160-4.5 MCG/ACT inhaler Inhale 2 puffs into the lungs 2 (two) times daily.    [provider]  carboxymethylcellulose (REFRESH PLUS) 0.5 % SOLN Place 1 drop into both eyes daily as needed (dry eyes).    [provider]  citalopram  (CELEXA ) 20 MG tablet Take 20 mg by mouth daily. 10/12/16   [provider]  CRANBERRY PO Take 1 tablet by mouth daily.    [provider]  diclofenac  Sodium (VOLTAREN ) 1 % GEL APPLY 2 GRAMS TOPICALLY TO AFFECTED AREA THREE TIMES DAILY 08/08/19   Timothy Ford, MD  estradiol  (ESTRACE ) 0.1 MG/GM vaginal cream Apply 3 times weekly using a pea-sized amount on  fingertip as directed 05/13/23   Scarlet Curly, MD  ferrous sulfate  325 (65 FE) MG EC tablet Take 325 mg by mouth daily with breakfast.    [provider]  furosemide  (LASIX ) 20 MG tablet Take 2 tablets (40 mg total) by mouth daily. 07/16/23   Audria Leather, MD  gabapentin  (NEURONTIN ) 100 MG capsule Take 1 capsule (100 mg total) by mouth 3 (three) times daily. 06/19/23   Lavada Porteous, DO  guaiFENesin  (MUCINEX ) 600 MG 12 hr tablet Take 1 tablet (600 mg total) by mouth 2 (two) times daily as needed for cough. 07/16/23   Audria Leather, MD  hydrocortisone -pramoxine (ANALPRAM -HC) 2.5-1 % rectal cream Place rectally 2 (two) times daily. 07/16/23   Audria Leather, MD  insulin  glargine (LANTUS ) 100 UNIT/ML injection Inject 0.1 mLs (10 Units total) into the skin daily. 06/20/23   Lavada Porteous, DO  lidocaine  (LIDODERM ) 5 % Place 1 patch onto the skin daily. Remove & Discard patch within 12 hours or as directed by MD  06/19/23   Lavada Porteous, DO  loratadine  (CLARITIN ) 10 MG tablet Take 10 mg by mouth daily.    [provider]  metFORMIN  (GLUCOPHAGE ) 1000 MG tablet Take 1 tablet (1,000 mg total) by mouth daily with breakfast. 06/20/23   Lavada Porteous, DO  Multiple Vitamin (MULTIVITAMIN) capsule Take 1 capsule by mouth daily.    [provider]  nitroGLYCERIN  (NITROSTAT ) 0.4 MG SL tablet Place 1 tablet (0.4 mg total) under the tongue every 5 (five) minutes as needed for chest pain. 08/22/20 08/21/27  Krasowski, Robert J, MD  nystatin  (MYCOSTATIN /NYSTOP ) powder Apply topically 2 (two) times daily. Under right breast for intertrigo 06/19/23   Lavada Porteous, DO  [facility] ULTRA test strip 1 each by Other route daily. 06/16/23   [provider]  oxyCODONE  (OXY IR/ROXICODONE ) 5 MG immediate release tablet Take 1 tablet (5 mg total) by mouth every 4 (four) hours as needed for moderate pain (pain score 4-6). 07/16/23   Audria Leather, MD  pantoprazole  (PROTONIX ) 40 MG tablet Take 40 mg by mouth 2 (two) times daily. 11/14/16   [provider]  polyethylene glycol (MIRALAX  / GLYCOLAX ) 17 g packet Take 17 g by mouth daily. 06/20/23   Lavada Porteous, DO  potassium chloride  (MICRO-K ) 10 MEQ CR capsule Take 10 mEq by mouth daily. 11/20/14   [provider]  Probiotic Product (CULTRELLE KIDS IMMUNE DEFENSE PO) Take 1 Dose by mouth daily.    [provider]  rOPINIRole  (REQUIP ) 0.25 MG tablet Take 0.25 mg by mouth at bedtime.    [provider]  rosuvastatin  (CRESTOR ) 40 MG tablet Take 40 mg by mouth daily.    [provider]  senna (SENOKOT) 8.6 MG TABS tablet Take 2 tablets by mouth at bedtime.    [provider]  sitaGLIPtin (JANUVIA) 100 MG tablet Take 100 mg by mouth daily. 03/11/22   [provider]  Vitamin D , Ergocalciferol , (DRISDOL ) 1.25 MG (50000 UNIT) CAPS capsule Take 50,000 Units by mouth every Sunday. 02/10/23   [provider]    Physical Exam: Constitutional: Moderately built and nourished. Vitals:   08/23/23 1945 08/23/23 2300 08/24/23 0000 08/24/23 0117  BP: 112/77 135/67  (!) 183/62  Pulse: 63 (!) 57  63  Resp: 15 19  19   Temp: 98.5 F (36.9 C) 98.7 F (37.1 C)  98.3 F (36.8 C)  TempSrc: Oral Oral  Oral  SpO2: 99% 98%  100%  Weight:   107.5 kg  Eyes: Anicteric no pallor. ENMT: No discharge from the ears/nose or mouth. Neck: No mass felt.  No neck rigidity. Respiratory: No rhonchi or crepitations. Cardiovascular: S1-S2 heard. Abdomen: Soft nontender bowel sound present. Musculoskeletal: No edema. Skin: No rash. Neurologic: Alert awake oriented to time place and person.  Moves all extremities. Psychiatric: Appears normal.  Normal affect.   Labs on Admission: I have personally reviewed following labs and imaging studies  CBC: Recent Labs  Lab 08/23/23 1847  WBC 7.7  NEUTROABS 4.0  HGB 11.2*  HCT 36.6  MCV 94.8  PLT 259   Basic Metabolic Panel: Recent Labs  Lab 08/23/23 1847  NA 136  K 3.7  CL 97*  CO2 28  GLUCOSE 163*  BUN 23  CREATININE 1.01*  CALCIUM  9.3   GFR: Estimated Creatinine Clearance: 45.2 mL/min (A) (by C-G formula based on SCr of 1.01 mg/dL (H)). Liver Function Tests: Recent Labs  Lab 08/23/23 1847  AST 29  ALT 23  ALKPHOS 84  BILITOT 0.5  PROT 7.1  ALBUMIN 3.4*   No results for input(s): "LIPASE", "AMYLASE" in the last 168 hours. No results for input(s): "AMMONIA" in the last 168 hours. Coagulation Profile: Recent Labs  Lab 08/23/23 1847  INR 1.0   Cardiac Enzymes: No results for input(s): "CKTOTAL", "CKMB", "CKMBINDEX", "TROPONINI" in the last 168 hours. BNP (last 3 results) Recent Labs    12/23/22 1619  PROBNP 76   HbA1C: No results for input(s): "HGBA1C" in the last 72 hours. CBG: No results for input(s): "GLUCAP" in the last 168 hours. Lipid Profile: No results for input(s): "CHOL", "HDL", "LDLCALC", "TRIG",  "CHOLHDL", "LDLDIRECT" in the last 72 hours. Thyroid  Function Tests: No results for input(s): "TSH", "T4TOTAL", "FREET4", "T3FREE", "THYROIDAB" in the last 72 hours. Anemia Panel: No results for input(s): "VITAMINB12", "FOLATE", "FERRITIN", "TIBC", "IRON", "RETICCTPCT" in the last 72 hours. Urine analysis:    Component Value Date/Time   COLORURINE STRAW (A) 08/23/2023 0941   APPEARANCEUR HAZY (A) 08/23/2023 0941   APPEARANCEUR Hazy (A) 05/13/2023 1426   LABSPEC 1.009 08/23/2023 0941   PHURINE 5.0 08/23/2023 0941   GLUCOSEU NEGATIVE 08/23/2023 0941   HGBUR MODERATE (A) 08/23/2023 0941   BILIRUBINUR NEGATIVE 08/23/2023 0941   BILIRUBINUR Negative 05/13/2023 1426   KETONESUR NEGATIVE 08/23/2023 0941   PROTEINUR NEGATIVE 08/23/2023 0941   UROBILINOGEN 1.0 02/12/2015 1105   NITRITE NEGATIVE 08/23/2023 0941   LEUKOCYTESUR LARGE (A) 08/23/2023 0941   Sepsis Labs: @LABRCNTIP (procalcitonin:4,lacticidven:4) )No results found for this or any previous visit (from the past 240 hours).   Radiological Exams on Admission: CT ANGIO HEAD NECK W WO CM Result Date: 08/23/2023 CLINICAL DATA:  Headache, sudden, severe EXAM: CT ANGIOGRAPHY HEAD AND NECK WITH AND WITHOUT CONTRAST TECHNIQUE: Multidetector CT imaging of the head and neck was performed using the standard protocol during bolus administration of intravenous contrast. Multiplanar CT image reconstructions and MIPs were obtained to evaluate the vascular anatomy. Carotid stenosis measurements (when applicable) are obtained utilizing NASCET criteria, using the distal internal carotid diameter as the denominator. RADIATION DOSE REDUCTION: This exam was performed according to the departmental dose-optimization program which includes automated exposure control, adjustment of the mA and/or kV according to patient size and/or use of iterative reconstruction technique. CONTRAST:  75mL OMNIPAQUE  IOHEXOL  350 MG/ML SOLN COMPARISON:  CT head March 15, 25.  FINDINGS: CT HEAD FINDINGS Brain: No evidence of acute infarction, hemorrhage, hydrocephalus, extra-axial collection or mass lesion/mass effect. Vascular: See below. Skull: No acute fracture. Sinuses/Orbits: Clear sinuses.  No acute orbital findings. Other: No mastoid effusions. Review of the MIP images confirms the above findings CTA NECK FINDINGS Aortic arch: Aortic atherosclerosis. Great vessel origins are patent. Right carotid system: No evidence of dissection, stenosis (50% or greater), or occlusion. Left carotid system: No evidence of dissection, stenosis (50% or greater), or occlusion. Vertebral arteries: Codominant. No evidence of dissection, stenosis (50% or greater), or occlusion. Skeleton: Negative. Other neck: Redemonstrated left parotid mass. Upper chest: Visualized lung apices are clear. Review of the MIP images confirms the above findings CTA HEAD FINDINGS Anterior circulation: No significant stenosis, proximal occlusion, aneurysm, or vascular malformation. No aneurysm identified. Posterior circulation: No significant stenosis, proximal occlusion, aneurysm, or vascular malformation. No aneurysm identified. Venous sinuses: As permitted by contrast timing, patent. Review of the MIP images confirms the above findings IMPRESSION: 1. No evidence of acute intracranial abnormality. 2. No large vessel occlusion or proximal hemodynamically significant stenosis. 3. No aneurysm identified. 4. Redemonstrated left parotid mass. Electronically Signed   By: Stevenson Elbe M.D.   On: 08/23/2023 22:16   CT C-SPINE NO CHARGE Result Date: 08/23/2023 CLINICAL DATA:  Neck pain, acute, no red flags : headache, sudden, severe; neck pain, acute, no red flags Chief complaints: Respiratory distress, weakness EXAM: CT CERVICAL SPINE WITHOUT CONTRAST TECHNIQUE: Multidetector CT imaging of the cervical spine was performed without intravenous contrast. Multiplanar CT image reconstructions were also generated. RADIATION DOSE  REDUCTION: This exam was performed according to the departmental dose-optimization program which includes automated exposure control, adjustment of the mA and/or kV according to patient size and/or use of iterative reconstruction technique. COMPARISON:  CT head and C-spine 06/13/2023. CT head 02/08/2018 FINDINGS: Alignment: Grade 1 anterolisthesis of C2 on C3, C3 on C4, C4 on C5. Skull base and vertebrae: C5 through C7 moderate degenerative changes of the spine. No associated severe osseous neural foraminal or central canal stenosis. No acute fracture. No aggressive appearing focal osseous lesion or focal pathologic process. Soft tissues and spinal canal: No prevertebral fluid or swelling. No visible canal hematoma. Upper chest: Unremarkable. Other: Atherosclerotic plaque. A 1.6 x 1.4 cm well-circumscribed homogeneous left parotid gland lesion. IMPRESSION: 1. No acute displaced fracture or traumatic listhesis of the cervical spine. 2. A 1.6 x 1.4 cm well-circumscribed homogeneous left parotid gland lesion. 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 Recommend attention on follow-up. Electronically Signed   By: Morgane  Naveau M.D.   On: 08/23/2023 21:48   DG Chest Port 1 View Result Date: 08/23/2023 CLINICAL DATA:  Sepsis, hypotension EXAM: PORTABLE CHEST 1 VIEW COMPARISON:  07/12/2023 FINDINGS: Single frontal view of the chest was obtained with the patient rotated toward the left. Cardiac silhouette is enlarged. Increased density within the left lower chest obscures the left hemidiaphragm, consistent with consolidation and/or effusion. The right chest is clear. No pneumothorax. No acute bony abnormalities. IMPRESSION: 1. Increased density at the left lung base consistent with left lower lobe consolidation and/or effusion. 2. Stable enlarged cardiac silhouette. Electronically Signed   By: Bobbye Burrow M.D.   On: 08/23/2023 19:13    EKG: Independently reviewed.  Normal sinus rhythm LVH.  Heart rate is  around 64 beats minute.  Assessment/Plan Principal Problem:   Acute respiratory failure with hypoxia (HCC) Active Problems:   Paroxysmal atrial fibrillation (HCC)   COPD (chronic obstructive pulmonary disease) (HCC)   Headache   Type 2 diabetes mellitus (HCC)   Pneumonia    Acute respiratory failure with hypoxia presently on 4 L oxygen  secondary to pneumonia for  which patient is on empiric antibiotics. Headache cause not clear.  Improved with Compazine.  CT angiogram head and neck and CT C-spine did not show any acute.  If headache recurs will need further workup. Chronic diastolic CHF continue Lasix . Diabetes mellitus type 2 takes Januvia at home we will keep patient on sliding scale coverage.  Last hemoglobin A1c was 7.4  2 months ago. Paroxysmal atrial fibrillation presently in sinus rhythm.  On amiodarone .  Not on any anticoagulation anymore. COPD takes Symbicort  at home.  Will keep patient on nebulizer while inpatient. Hyperlipidemia on statins. Chronic kidney disease stage III creatinine at baseline. Restless leg syndrome on Requip . Neuropathy on gabapentin . Mood disorder on citalopram . Left parotid gland lesion may follow-up as outpatient.   Since patient has acute respiratory failure with pneumonia will need close monitoring and more than 2 midnight stay.   DVT prophylaxis: Lovenox . Code Status: DNR confirmed with patient's daughter. Family Communication: Patient's daughter. Disposition Plan: Medical floor. Consults called: None. Admission status: Observation.

## 2023-08-24 NOTE — Assessment & Plan Note (Signed)
 08-24-2023 not exacerbated. Continue with nebs. Hold on systemic steroids.  08-25-2023 stable

## 2023-08-24 NOTE — Consult Note (Signed)
 Palliative Medicine Inpatient Consult Note  Consulting Provider: Dr. Farrel Hones  Reason for consult:   Palliative Care Consult Services Palliative Medicine Consult  Reason for Consult? GOC. already on home hospice. family brought pt back to ER despite "avoiding hospitalizations" noted on last palliative care notes   08/24/2023  HPI:  Per intake H&P -->  Danielle [name] is a 89 y.o. female with history of chronic respiratory failure on 2 L oxygen  secondary to COPD, history of diastolic CHF, diabetes mellitus type 2, paroxysmal atrial fibrillation, hyperlipidemia was brought to the ER after patient was having persistent cough and shortness of breath and over the last two days has been having some occipital headache.   Danielle [name] is established with [facility]-in[facility] palliative care has been consulted for further goals of care conversations.  Clinical Assessment/Goals of Care:  *Please note that this is a verbal dictation therefore any spelling or grammatical errors are due to the "Dragon Medical One" system interpretation.  I have reviewed medical records including EPIC notes, labs and imaging, received report from bedside RN, assessed the patient who is sitting up in the chair sharing that she is having pain in her bilateral lower extremities.    I met with Danielle [name], Danielle [name] to further discuss diagnosis prognosis, GOC, EOL wishes, disposition and options.   I introduced Palliative Medicine as specialized medical care for people living with serious illness. It focuses on providing relief from the symptoms and stress of a serious illness. The goal is to improve quality of life for both the patient and the family.  Medical History Review and Understanding:  A review of Danielle [name]'s past medical history was completed inclusive of her diastolic heart failure, type 2 diabetes, paroxysmal atrial fibrillation, hyperlipidemia, and COPD  Social  History:  Danielle [name] shares that she is from [facility] .  She shares that her spouse is deceased.  She has 2 children-a son and a [name], 4 grandchildren, and 6 great-grandchildren.  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  Danielle [name] shares that she used to love square dancing when she was in better health.  Danielle [name] is a woman of faith practicing within Christianity.  Functional and Nutritional State:  Prior to hospitalization Danielle [name] had been living with her [name] where she is able to mobilize with a front wheel walker to the commode.  Danielle [name] shares she does have a hospital bed, a lift chair, of commode, and oxygen .  Danielle [name] does have a good appetite.  Advance Directives:  A detailed discussion was had today regarding advanced directives.  Danielle [name], Danielle [name] is her [name] Danielle [name] per self-report though there are no advanced directives on file.  Code Status:  Concepts specific to code status, artifical feeding and hydration, continued IV antibiotics and rehospitalization was had.  The difference between a aggressive medical intervention path  and a palliative comfort care path for this patient at this time was had.   Danielle [name] is an established DO NOT RESUSCITATE DO NOT INTUBATE CODE STATUS.  Discussion:  Preceding hospitalization Danielle [name] had been living with her [name] since about April 17.  She was no longer able to live in her own home due to inability to attend to self-care needs.  Danielle [name] was having more difficulty sleeping and despite her [name] giving her a breathing treatment and medication to help with her anxiety she continues to have rattling.  The hospice nurse evaluated her and recommended  transitioning to inpatient hospital setting for further treatment.  Since being admitted Danielle [name] is being treated with ceftriaxone  for urinary tract infection.  Danielle [name] also notes  significant secretion burden she is starting to cough up and does have "green like grass secretions.  Discussed the difficulty of Danielle [name] sleeping in the setting of her current need to get up and urinate.  Reviewed that Dr. Farrel Hones has started some low-dose Seroquel.  We reviewed continuation of antibiotics at this time to treat patient's underlying infection.  We discussed starting some medication to help with patient's rattling.  We reviewed the importance of pulmonary hygiene.  Patient's [name] and I discussed the potential for Danielle [name] to go into [facility] [place] for respite care.  We also discussed that she would transition there if symptoms became unmanageable and/or if she was at the end of life.  Plan at this time is to allow time for outcomes.  Patient's [name] does agree to take her back home with [facility] when medically optimized for discharge.  Discussed the importance of continued conversation with family and their  medical providers regarding overall plan of care and treatment options, ensuring decisions are within the context of the patients values and GOCs.  Decision Maker: Danielle [name] ([name]): [phone] (Mobile)   SUMMARY OF RECOMMENDATIONS   DNAR/DNI  Patient is established with [facility] plan for her to discharge home with her services once medically optimized  At this point in time patient and her family would like to continue treatment for UTI  Agree with initiating Seroquel for bedtime  Initiate glycopyrrolate  1mg  PO TID  Pulmonary hygiene through RT  Added Voltaren  gel for BLE discomfort as this works at home in addition to elevation  Ongoing palliative care support  Code Status/Advance Care Planning: DNAR/DNI  Palliative Prophylaxis:  Aspiration, Bowel Regimen, Delirium Protocol, Frequent Pain Assessment, Oral Care, Palliative Wound Care, and Turn Reposition  Additional Recommendations (Limitations, Scope,  Preferences): Continue current care  Psycho-social/Spiritual:  Desire for further Chaplaincy support: Yes Additional Recommendations: Education on chronic disease burden   Prognosis: Patient is established with hospice less than 6 months  Discharge Planning: Discharge home once medically optimized  Vitals:   08/24/23 0838 08/24/23 1018  BP:  (!) 157/67  Pulse:  69  Resp:  20  Temp:  98 F (36.7 C)  SpO2: 97% 100%    Intake/Output Summary (Last 24 hours) at 08/24/2023 1043 Last data filed at 08/24/2023 1035 Gross per 24 hour  Intake 421.1 ml  Output --  Net 421.1 ml   Last Weight  Most recent update: 08/24/2023  6:20 AM    Weight  105.3 kg (232 lb 2.3 oz)            Gen: Elderly Caucasian female chronically ill-appearing HEENT: moist mucous membranes CV: Regular rate and rhythm PULM: On 3 L nasal cannula breathing is nonlabored ABD: soft/nontender EXT: Bilateral lower extremity edema Neuro: Alert and oriented x3  PPS: 40-50%   This conversation/these recommendations were discussed with patient primary care team, Dr. Farrel Hones ______________________________________________________ Camille Cedars [facility] Palliative Medicine Team Team Cell Phone: [phone] Please utilize secure chat with additional questions, if there is no response within 30 minutes please call the above phone number  Billing based on MDM: High Time: 82  Palliative Medicine Team providers are available by phone from 7am to 7pm daily and can be reached through the team cell phone.  Should this patient require assistance outside of these  hours, please call the patient's attending physician.

## 2023-08-24 NOTE — Assessment & Plan Note (Signed)
 08-24-2023 on SSI.  08-25-2023 stable

## 2023-08-24 NOTE — Assessment & Plan Note (Signed)
 08-24-2023 stable  08-25-2023 stable

## 2023-08-24 NOTE — Assessment & Plan Note (Signed)
 08-24-2023 palliative care consult for GOC discussion. [name] discussed outside pt's room. Pt has been living with [name] since discharge in April.  Pt does not sleep well at night. Is up about every 1 hour. Po haldol, ativan and oxycodone  given at night do not help pt sleep. Then pt sleeps all day long.  [name] is feeling very tired. [name]'s husband works 3rd shift so he is not home at night and he sleeps during the day.   Hospice provides help only 2 days a week.   [name] understands that pt is not going to get better and that rehab at SNF will not help.  She is prepared for the time when her mother will die.   Pt is very impatient when it comes to pain and anxiety. Pt demands that dtr relieve the pain/anxiety immediately even when dtr tries to give her po meds.   Discussed that increases in pain/anxiety/sleep meds will help with sleep hygiene. Discussed side effects and that pt may have some hangover symptoms in the AM. Discussed that [name] needs to give herself some grace. Discussed that [name] is doing all that she can for her mother and that is respectable.   Will increase pt's pain meds and add IV dilaudid  prn.     Will also start Seroquel at bedtime to help with agitation during the night.   Will also add bladder spasm agent to help with urinary frequency.  08-25-2023 Met with pt's dtr angie at bedside. Michelle with palliative care also at bedside.  Pt is sedated from seroquel last night. Dtt was aware of this side effect prior to starting seroquel last night.  Dtr states pt needs to be able to stand to use BSC or walk to go to bathroom. Dtr wants seroquel to be stopped. Also wants bladder spasm agents to be stopped. These were both meds she requested to be started yesterday, now she wants them discontinued.  Will continue increased dose of oxycodone  at night. Dtr advised not to mix ativan and oxycodone  together for patient at bedtime. Discussed that eventually, pt will need to go to hospice  house as her clinical condition decline.

## 2023-08-24 NOTE — Assessment & Plan Note (Signed)
 08-24-2023 stable. Continue crestor .  08-25-2023 stable

## 2023-08-24 NOTE — Assessment & Plan Note (Signed)
 08-24-2023 confirmed DNR/DNI status with pt's dtr [name].

## 2023-08-24 NOTE — Progress Notes (Signed)
 Pharmacy Antibiotic Note  Danielle [name] is a 89 y.o. female admitted on 08/23/2023 with generalized weakness, worsening dyspnea, and complaining of severe headache neck pain. Aaron Aas  Pharmacy has been consulted to dose vancomycin for pna.  Plan: Vancomycin 1250mg  IV q36h (AUC 498.5, Scr 1) Follow renal function,cultures and clinical course  Weight: 107.5 kg (236 lb 15.9 oz)  Temp (24hrs), Avg:98.4 F (36.9 C), Min:98.2 F (36.8 C), Max:98.7 F (37.1 C)  Recent Labs  Lab 08/23/23 1847 08/23/23 1901 08/23/23 2053  WBC 7.7  --   --   CREATININE 1.01*  --   --   LATICACIDVEN  --  2.0* 1.9    Estimated Creatinine Clearance: 45.2 mL/min (A) (by C-G formula based on SCr of 1.01 mg/dL (H)).    Allergies  Allergen Reactions   Codeine Swelling   Penicillins Swelling   Xarelto  [Rivaroxaban ] Other (See Comments)    Causes bleeding in stool and urine   Levofloxacin Other (See Comments)    Other reaction(s): Malaise (intolerance)   Tape Rash    Antimicrobials this admission: 5/26 vanc >> 5/26 aztreonam >>  Dose adjustments this admission:   Microbiology results: 5/25 BCx:  5/25 UCx:   Thank you for allowing pharmacy to be a part of this patient's care.  Beau Bound RPh 08/24/2023, 2:07 AM

## 2023-08-24 NOTE — Assessment & Plan Note (Signed)
 08-24-2023 I don't she has pneumonia. Procalcitonin is negative. I think pt has atelectasis from sitting in chair/bed all day. Pneumonia ruled out.

## 2023-08-24 NOTE — Assessment & Plan Note (Signed)
 Body mass index is 41.12 kg/m.

## 2023-08-24 NOTE — Assessment & Plan Note (Signed)
 08-24-2023 hopefully adding seroquel at night will help.  08-25-2023 dtr wants to stop seroquel.

## 2023-08-24 NOTE — Assessment & Plan Note (Addendum)
 08-24-2023 normally on 2 L/min O2.  Procalcitonin is negative. I don't think she has pneumonia.  Wean back to 2L/min.  08-24-2023 resolved. Back on 2 L/min.

## 2023-08-24 NOTE — Hospital Course (Signed)
 HPI:  Danielle [name] is a 89 y.o. female with history of chronic respiratory failure on 2 L oxygen  secondary to COPD, history of diastolic CHF, diabetes mellitus type 2, paroxysmal atrial fibrillation, hyperlipidemia was brought to the ER after patient was having persistent cough and shortness of breath and over the last two days has been having some occipital headache.  As per the patient's daughter patient has been having persistent productive cough for the last 10 days had one course of Z-Pak following which patient was placed on Levaquin despite which patient was still having cough productive of greenish sputum.  Last two days patient has been having neck and occipital headache.  Denies any weakness of extremities.   In the month of April 2025 patient was discharged home on hospice.   ED Course: In the ER patient was afebrile.  CT angiogram head and neck and CT C-spine were unremarkable except for benign looking left parotid mass.  Chest x-ray shows increased consolidation in the left lung concerning for pneumonia.  Was started on empiric antibiotics and admitted for further observation.  Patient's headache improved after patient was given Compazine in the ER  Significant Events: Admitted 08/23/2023 acute on chronic hypoxic respiratory failure   Admission Labs: WBC 7.7, HgB 11.2, plt 259 INR 1.0 Na 136, K 3.7, CO2 of 28, BUN 23, scr 1.01, glu 163 BNP 81.5  Admission Imaging Studies: CXR Increased density at the left lung base consistent with left lower lobe consolidation and/or effusion. 2. Stable enlarged cardiac silhouette CTA head/neck, CT c-spine No evidence of acute intracranial abnormality. 2. No large vessel occlusion or proximal  hemodynamically significant stenosis. 3. No aneurysm identified. 4. Redemonstrated left parotid mass  Significant Labs:   Significant Imaging Studies:   Antibiotic Therapy: Anti-infectives (From admission, onward)    Start     Dose/Rate Route  Frequency Ordered Stop   08/25/23 1200  Vancomycin (VANCOCIN) 1,250 mg in sodium chloride  0.9 % 250 mL IVPB        1,250 mg 166.7 mL/hr over 90 Minutes Intravenous Every 36 hours 08/24/23 0203     08/24/23 1000  aztreonam (AZACTAM) 1 g in sodium chloride  0.9 % 100 mL IVPB        1 g 200 mL/hr over 30 Minutes Intravenous Every 8 hours 08/24/23 0209     08/24/23 0315  doxycycline  (VIBRAMYCIN ) 100 mg in sodium chloride  0.9 % 250 mL IVPB        100 mg 125 mL/hr over 120 Minutes Intravenous Every 12 hours 08/24/23 0217     08/24/23 0000  aztreonam (AZACTAM) 2 g in sodium chloride  0.9 % 100 mL IVPB  Status:  Discontinued        2 g 200 mL/hr over 30 Minutes Intravenous  Once 08/23/23 2353 08/23/23 2359   08/24/23 0000  vancomycin (VANCOCIN) IVPB 1000 mg/200 mL premix        1,000 mg 200 mL/hr over 60 Minutes Intravenous  Once 08/23/23 2353 08/24/23 0120   08/24/23 0000  aztreonam (AZACTAM) 1 g in sodium chloride  0.9 % 100 mL IVPB        1 g 200 mL/hr over 30 Minutes Intravenous  Once 08/23/23 2359 08/24/23 0205       Procedures:   Consultants:

## 2023-08-24 NOTE — Subjective & Objective (Signed)
 Pt seen and examined. Met with pt's dtr angie at bedside. Michelle with palliative care also at bedside.  Pt is sedated from seroquel last night. Dtr states pt needs to be able to stand to use BSC or walk to go to bathroom. Dtr wants seroquel to be stopped. Also wants bladder spasm agents to be stopped. These were both meds she requested to be started yesterday, now she wants them discontinued.  Urine cx negative Plan for DC to home tomorrow with ambulance in the afternoon.

## 2023-08-24 NOTE — Assessment & Plan Note (Signed)
 08-24-2023 continue with IV rocephin . Await culture.  08-25-2023 urine cx negative. Completed 3 days worth of abx then stop.

## 2023-08-24 NOTE — Progress Notes (Signed)
 PROGRESS NOTE    Danielle [name]  Danielle [name] DOB: Dec 31, 1933 DOA: 08/23/2023 PCP: Abbe Hoard., MD  Subjective: Danielle [name] seen and examined. Met with Danielle [name] and dtr Danielle [name] at bedside.  Danielle [name] discussed outside Danielle [name]'s room. Danielle [name] has been living with Danielle [name] since discharge in April.  Danielle [name] does not sleep well at night. Is up about every 1 hour. Po haldol, ativan and oxycodone  given at night do not help Danielle [name] sleep. Then Danielle [name] sleeps all day long.  Danielle [name] is feeling very tired. Danielle [name] works 3rd shift so he is not home at night and he sleeps during the day.  Hospice provides help only 2 days a week.  Danielle [name] understands that Danielle [name] is not going to get better and that rehab at SNF will not help.  She is prepared for the time when Danielle [name] will die.  Danielle [name] is very impatient when it comes to pain and anxiety. Danielle [name] demands that dtr relieve the pain/anxiety immediately even when dtr tries to give Danielle po meds.  Discussed that increases in pain/anxiety/sleep meds will help with sleep hygiene. Discussed side effects and that Danielle [name] may have some hangover symptoms in the AM. Discussed that Danielle [name] needs to give herself some grace. Discussed that Danielle [name] is doing all that she can for Danielle [name] and that is respectable.  Will increase Danielle [name]'s pain meds and add IV dilaudid  prn.    Will also start Seroquel at bedtime to help with agitation during the night.  Will also add bladder spasm agent to help with urinary frequency.   Hospital Course: HPI:  Danielle [name] is a 89 y.o. female with history of chronic respiratory failure on 2 L oxygen  secondary to COPD, history of diastolic CHF, diabetes mellitus type 2, paroxysmal atrial fibrillation, hyperlipidemia was brought to the ER after patient was having persistent cough and shortness of breath and over the last two days has been having some occipital headache.  As per the patient's daughter patient has been having persistent productive cough for the last 10 days had one course of  Z-Pak following which patient was placed on Levaquin despite which patient was still having cough productive of greenish sputum.  Last two days patient has been having neck and occipital headache.  Denies any weakness of extremities.   In the month of April 2025 patient was discharged home on hospice.   ED Course: In the ER patient was afebrile.  CT angiogram head and neck and CT C-spine were unremarkable except for benign looking left parotid mass.  Chest x-ray shows increased consolidation in the left lung concerning for pneumonia.  Was started on empiric antibiotics and admitted for further observation.  Patient's headache improved after patient was given Compazine in the ER  Significant Events: Admitted 08/23/2023 acute on chronic hypoxic respiratory failure   Admission Labs: WBC 7.7, HgB 11.2, plt 259 INR 1.0 Na 136, K 3.7, CO2 of 28, BUN 23, scr 1.01, glu 163 BNP 81.5  Admission Imaging Studies: CXR Increased density at the left lung base consistent with left lower lobe consolidation and/or effusion. 2. Stable enlarged cardiac silhouette CTA head/neck, CT c-spine No evidence of acute intracranial abnormality. 2. No large vessel occlusion or proximal  hemodynamically significant stenosis. 3. No aneurysm identified. 4. Redemonstrated left parotid mass  Significant Labs:   Significant Imaging Studies:   Antibiotic Therapy: Anti-infectives (From admission, onward)    Start     Dose/Rate Route Frequency Ordered Stop   08/25/23 1200  Vancomycin (VANCOCIN) 1,250 mg  in sodium chloride  0.9 % 250 mL IVPB        1,250 mg 166.7 mL/hr over 90 Minutes Intravenous Every 36 hours 08/24/23 0203     08/24/23 1000  aztreonam (AZACTAM) 1 g in sodium chloride  0.9 % 100 mL IVPB        1 g 200 mL/hr over 30 Minutes Intravenous Every 8 hours 08/24/23 0209     08/24/23 0315  doxycycline  (VIBRAMYCIN ) 100 mg in sodium chloride  0.9 % 250 mL IVPB        100 mg 125 mL/hr over 120 Minutes Intravenous  Every 12 hours 08/24/23 0217     08/24/23 0000  aztreonam (AZACTAM) 2 g in sodium chloride  0.9 % 100 mL IVPB  Status:  Discontinued        2 g 200 mL/hr over 30 Minutes Intravenous  Once 08/23/23 2353 08/23/23 2359   08/24/23 0000  vancomycin (VANCOCIN) IVPB 1000 mg/200 mL premix        1,000 mg 200 mL/hr over 60 Minutes Intravenous  Once 08/23/23 2353 08/24/23 0120   08/24/23 0000  aztreonam (AZACTAM) 1 g in sodium chloride  0.9 % 100 mL IVPB        1 g 200 mL/hr over 30 Minutes Intravenous  Once 08/23/23 2359 08/24/23 0205       Procedures:   Consultants:     Assessment and Plan: * Acute on chronic respiratory failure with hypoxia (HCC) 08-24-2023 normally on 2 L/min O2.  Procalcitonin is negative. I don't think she has pneumonia.  Wean back to 2L/min.  Hospice care patient 08-24-2023 palliative care consult for GOC discussion. Danielle [name] discussed outside Danielle [name]'s room. Danielle [name] has been living with Danielle [name] since discharge in April.  Danielle [name] does not sleep well at night. Is up about every 1 hour. Po haldol, ativan and oxycodone  given at night do not help Danielle [name] sleep. Then Danielle [name] sleeps all day long.  Danielle [name] is feeling very tired. Danielle [name] works 3rd shift so he is not home at night and he sleeps during the day.   Hospice provides help only 2 days a week.   Danielle [name] understands that Danielle [name] is not going to get better and that rehab at SNF will not help.  She is prepared for the time when Danielle [name] will die.   Danielle [name] is very impatient when it comes to pain and anxiety. Danielle [name] demands that dtr relieve the pain/anxiety immediately even when dtr tries to give Danielle po meds.   Discussed that increases in pain/anxiety/sleep meds will help with sleep hygiene. Discussed side effects and that Danielle [name] may have some hangover symptoms in the AM. Discussed that Danielle [name] needs to give herself some grace. Discussed that Danielle [name] is doing all that she can for Danielle [name] and that is respectable.   Will increase Danielle [name]'s pain meds and add IV dilaudid   prn.     Will also start Seroquel at bedtime to help with agitation during the night.   Will also add bladder spasm agent to help with urinary frequency.  Acute cystitis without hematuria 08-24-2023 continue with IV rocephin . Await culture.  DNR (do not resuscitate)/DNI(Do Not Intubate) 08-24-2023 confirmed DNR/DNI status with Danielle [name]'s dtr Danielle [name].  Anemia 08-24-2023 stable.  Type 2 diabetes mellitus (HCC) 08-24-2023 on SSI.  Paroxysmal atrial fibrillation (HCC) 08-24-2023 on amiodarone .  COPD (chronic obstructive pulmonary disease) (HCC) 08-24-2023 no exacerbated. Continue with nebs. Hold on systemic steroids.  Headache 08-24-2023 resolved after compazine. ?? Migraine headache.  Restless legs syndrome 08-24-2023 hopefully adding  seroquel at night will help.  Class 3 obesity Body mass index is 41.12 kg/m.   Stage 3a chronic kidney disease (HCC) 08-24-2023 stable  Mixed hyperlipidemia 08-24-2023 stable. Continue crestor .  Chronic diastolic congestive heart failure (HCC) 08-24-2023 stable. Euvolemic. Continue daily lasix  po.  Pneumonia-resolved as of 08/24/2023 08-24-2023 I don't she has pneumonia. Procalcitonin is negative. I think Danielle [name] has atelectasis from sitting in chair/bed all day. Pneumonia ruled out.  DVT prophylaxis: enoxaparin  (LOVENOX ) injection 40 mg Start: 08/24/23 1000    Code Status: Limited: Do not attempt resuscitation (DNR) -DNR-LIMITED -Do Not Intubate/DNI  Family Communication: discussed at bedside and outside Danielle [name]'s room with dtr Danielle [name] Disposition Plan: return home Reason for continuing need for hospitalization: adding IV dilaudid  prn, oxycodone  prn and at bedtime seroquel. Palliative care consulted.  Objective: Vitals:   08/24/23 0838 08/24/23 1018 08/24/23 1230 08/24/23 1235  BP:  (!) 157/67  (!) 148/75  Pulse:  69  64  Resp:  20  20  Temp:  98 F (36.7 C)  98 F (36.7 C)  TempSrc:  Oral  Oral  SpO2: 97% 100% 98% 100%  Weight:         Intake/Output Summary (Last 24 hours) at 08/24/2023 1327 Last data filed at 08/24/2023 1218 Gross per 24 hour  Intake 661.1 ml  Output --  Net 661.1 ml   Filed Weights   08/24/23 0000 08/24/23 0600  Weight: 107.5 kg 105.3 kg    Examination:  Physical Exam Vitals and nursing note reviewed.  Constitutional:      Appearance: She is obese.  HENT:     Head: Normocephalic and atraumatic.  Eyes:     General: No scleral icterus. Cardiovascular:     Rate and Rhythm: Normal rate.  Pulmonary:     Effort: Pulmonary effort is normal. No respiratory distress.  Abdominal:     General: Abdomen is protuberant. Bowel sounds are normal.     Palpations: Abdomen is soft.  Musculoskeletal:     Right lower leg: Edema present.     Left lower leg: Edema present.  Skin:    General: Skin is warm and dry.     Capillary Refill: Capillary refill takes less than 2 seconds.  Neurological:     Mental Status: She is disoriented.     Data Reviewed: I have personally reviewed following labs and imaging studies  CBC: Recent Labs  Lab 08/23/23 1847 08/24/23 0533  WBC 7.7 8.3  NEUTROABS 4.0  --   HGB 11.2* 10.2*  HCT 36.6 33.1*  MCV 94.8 94.6  PLT 259 238   Basic Metabolic Panel: Recent Labs  Lab 08/23/23 1847 08/24/23 0533  NA 136 133*  K 3.7 3.4*  CL 97* 95*  CO2 28 28  GLUCOSE 163* 191*  BUN 23 19  CREATININE 1.01* 1.06*  CALCIUM  9.3 8.9   GFR: Estimated Creatinine Clearance: 42.6 mL/min (A) (by C-G formula based on SCr of 1.06 mg/dL (H)). Liver Function Tests: Recent Labs  Lab 08/23/23 1847  AST 29  ALT 23  ALKPHOS 84  BILITOT 0.5  PROT 7.1  ALBUMIN 3.4*   Coagulation Profile: Recent Labs  Lab 08/23/23 1847  INR 1.0   BNP (last 3 results) Recent Labs    06/13/23 1136 07/12/23 1014 08/23/23 1847  BNP 120.6* 149.7* 81.5   CBG: Recent Labs  Lab 08/24/23 0750 08/24/23 1138  GLUCAP 168* 177*   Sepsis Labs: Recent Labs  Lab 08/23/23 1901  08/23/23 2053 08/24/23 0530  PROCALCITON  --   --  <0.10  LATICACIDVEN 2.0* 1.9  --     Recent Results (from the past 240 hours)  Blood Culture (routine x 2)     Status: None (Preliminary result)   Collection Time: 08/23/23  6:52 PM   Specimen: BLOOD  Result Value Ref Range Status   Specimen Description   Final    BLOOD LEFT ANTECUBITAL Performed at [facility], [address], [place], [place] [address]    Special Requests   Final    BOTTLES DRAWN AEROBIC AND ANAEROBIC Blood Culture results may not be optimal due to an inadequate volume of blood received in culture bottles Performed at [facility], [address], [place], [place] [address]    Culture   Final    NO GROWTH < 12 HOURS Performed at [facility], [address], [place], [place] [address]    Report Status PENDING  Incomplete  Blood Culture (routine x 2)     Status: None (Preliminary result)   Collection Time: 08/23/23  6:59 PM   Specimen: BLOOD  Result Value Ref Range Status   Specimen Description   Final    BLOOD RIGHT ANTECUBITAL Performed at [facility], [address], [place], [place] [address]    Special Requests   Final    BOTTLES DRAWN AEROBIC AND ANAEROBIC Blood Culture results may not be optimal due to an inadequate volume of blood received in culture bottles Performed at [facility], [address] [place], [place] [address]    Culture   Final    NO GROWTH < 12 HOURS Performed at [facility], [address] [place], [place] [address]    Report Status PENDING  Incomplete     Radiology Studies: CT ANGIO HEAD NECK W WO CM Result Date: 08/23/2023 CLINICAL DATA:  Headache, sudden, severe EXAM: CT ANGIOGRAPHY HEAD AND NECK WITH AND WITHOUT CONTRAST TECHNIQUE: Multidetector CT imaging of the head and neck was performed using the standard protocol during bolus administration of intravenous contrast. Multiplanar  CT image reconstructions and MIPs were obtained to evaluate the vascular anatomy. Carotid stenosis measurements (when applicable) are obtained utilizing NASCET criteria, using the distal internal carotid diameter as the denominator. RADIATION DOSE REDUCTION: This exam was performed according to the departmental dose-optimization program which includes automated exposure control, adjustment of the mA and/or kV according to patient size and/or use of iterative reconstruction technique. CONTRAST:  75mL OMNIPAQUE  IOHEXOL  350 MG/ML SOLN COMPARISON:  CT head March 15, 25. FINDINGS: CT HEAD FINDINGS Brain: No evidence of acute infarction, hemorrhage, hydrocephalus, extra-axial collection or mass lesion/mass effect. Vascular: See below. Skull: No acute fracture. Sinuses/Orbits: Clear sinuses.  No acute orbital findings. Other: No mastoid effusions. Review of the MIP images confirms the above findings CTA NECK FINDINGS Aortic arch: Aortic atherosclerosis. Great vessel origins are patent. Right carotid system: No evidence of dissection, stenosis (50% or greater), or occlusion. Left carotid system: No evidence of dissection, stenosis (50% or greater), or occlusion. Vertebral arteries: Codominant. No evidence of dissection, stenosis (50% or greater), or occlusion. Skeleton: Negative. Other neck: Redemonstrated left parotid mass. Upper chest: Visualized lung apices are clear. Review of the MIP images confirms the above findings CTA HEAD FINDINGS Anterior circulation: No significant stenosis, proximal occlusion, aneurysm, or vascular malformation. No aneurysm identified. Posterior circulation: No significant stenosis, proximal occlusion, aneurysm, or vascular malformation. No aneurysm identified. Venous sinuses: As permitted by contrast  timing, patent. Review of the MIP images confirms the above findings IMPRESSION: 1. No evidence of acute intracranial abnormality. 2. No large vessel occlusion or proximal hemodynamically  significant stenosis. 3. No aneurysm identified. 4. Redemonstrated left parotid mass. Electronically Signed   By: Stevenson Elbe M.D.   On: 08/23/2023 22:16   CT C-SPINE NO CHARGE Result Date: 08/23/2023 CLINICAL DATA:  Neck pain, acute, no red flags : headache, sudden, severe; neck pain, acute, no red flags Chief complaints: Respiratory distress, weakness EXAM: CT CERVICAL SPINE WITHOUT CONTRAST TECHNIQUE: Multidetector CT imaging of the cervical spine was performed without intravenous contrast. Multiplanar CT image reconstructions were also generated. RADIATION DOSE REDUCTION: This exam was performed according to the departmental dose-optimization program which includes automated exposure control, adjustment of the mA and/or kV according to patient size and/or use of iterative reconstruction technique. COMPARISON:  CT head and C-spine 06/13/2023. CT head 02/08/2018 FINDINGS: Alignment: Grade 1 anterolisthesis of C2 on C3, C3 on C4, C4 on C5. Skull base and vertebrae: C5 through C7 moderate degenerative changes of the spine. No associated severe osseous neural foraminal or central canal stenosis. No acute fracture. No aggressive appearing focal osseous lesion or focal pathologic process. Soft tissues and spinal canal: No prevertebral fluid or swelling. No visible canal hematoma. Upper chest: Unremarkable. Other: Atherosclerotic plaque. A 1.6 x 1.4 cm well-circumscribed homogeneous left parotid gland lesion. IMPRESSION: 1. No acute displaced fracture or traumatic listhesis of the cervical spine. 2. A 1.6 x 1.4 cm well-circumscribed homogeneous left parotid gland lesion. 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 Recommend attention on follow-up. Electronically Signed   By: Morgane  Naveau M.D.   On: 08/23/2023 21:48   DG Chest Port 1 View Result Date: 08/23/2023 CLINICAL DATA:  Sepsis, hypotension EXAM: PORTABLE CHEST 1 VIEW COMPARISON:  07/12/2023 FINDINGS: Single frontal view of the chest was  obtained with the patient rotated toward the left. Cardiac silhouette is enlarged. Increased density within the left lower chest obscures the left hemidiaphragm, consistent with consolidation and/or effusion. The right chest is clear. No pneumothorax. No acute bony abnormalities. IMPRESSION: 1. Increased density at the left lung base consistent with left lower lobe consolidation and/or effusion. 2. Stable enlarged cardiac silhouette. Electronically Signed   By: Bobbye Burrow M.D.   On: 08/23/2023 19:13    Scheduled Meds:  amiodarone   200 mg Oral Daily   budesonide  (PULMICORT ) nebulizer solution  0.25 mg Nebulization BID   citalopram   20 mg Oral Daily   enoxaparin  (LOVENOX ) injection  40 mg Subcutaneous Q24H   furosemide   40 mg Oral Daily   gabapentin   100 mg Oral TID   insulin  aspart  0-9 Units Subcutaneous TID WC   ipratropium-albuterol   3 mL Nebulization Q4H   lidocaine   1 patch Transdermal Q24H   mirabegron ER  50 mg Oral Daily   pantoprazole   40 mg Oral BID   QUEtiapine  200 mg Oral QHS   rOPINIRole   0.25 mg Oral QHS   rosuvastatin   40 mg Oral Daily   Continuous Infusions:  cefTRIAXone  (ROCEPHIN )  IV       LOS: 0 days   Time spent: 50 minutes  Unk Garb, DO  [facility]  08/24/2023, 1:27 PM

## 2023-08-24 NOTE — Assessment & Plan Note (Signed)
 08-24-2023 resolved after compazine. ?? Migraine headache.

## 2023-08-25 DIAGNOSIS — I5032 Chronic diastolic (congestive) heart failure: Secondary | ICD-10-CM | POA: Diagnosis not present

## 2023-08-25 DIAGNOSIS — Z515 Encounter for palliative care: Secondary | ICD-10-CM | POA: Diagnosis not present

## 2023-08-25 DIAGNOSIS — N3000 Acute cystitis without hematuria: Secondary | ICD-10-CM | POA: Diagnosis not present

## 2023-08-25 DIAGNOSIS — R52 Pain, unspecified: Secondary | ICD-10-CM

## 2023-08-25 DIAGNOSIS — Z7189 Other specified counseling: Secondary | ICD-10-CM | POA: Diagnosis not present

## 2023-08-25 DIAGNOSIS — J9621 Acute and chronic respiratory failure with hypoxia: Secondary | ICD-10-CM | POA: Diagnosis not present

## 2023-08-25 LAB — GLUCOSE, CAPILLARY
Glucose-Capillary: 169 mg/dL — ABNORMAL HIGH (ref 70–99)
Glucose-Capillary: 151 mg/dL — ABNORMAL HIGH (ref 70–99)
Glucose-Capillary: 206 mg/dL — ABNORMAL HIGH (ref 70–99)
Glucose-Capillary: 175 mg/dL — ABNORMAL HIGH (ref 70–99)

## 2023-08-25 LAB — URINE CULTURE: Culture: <10000 — AB

## 2023-08-25 MED ORDER — SODIUM CHLORIDE 0.9 % IV SOLN
1.0000 g | INTRAVENOUS | Status: AC
  Administered 2023-08-25 – 2023-08-26 (×2): 1 g via INTRAVENOUS
  Filled 2023-08-25 (×2): qty 10

## 2023-08-25 MED ORDER — OXYCODONE HCL 5 MG PO TABS: 5.0000 mg | ORAL_TABLET | ORAL | 0 refills | Status: AC | PRN

## 2023-08-25 NOTE — Progress Notes (Addendum)
 Palliative Medicine Inpatient Follow Up Note HPI: Danielle [name] is a 89 y.o. female with history of chronic respiratory failure on 2 L oxygen  secondary to COPD, history of diastolic CHF, diabetes mellitus type 2, paroxysmal atrial fibrillation, hyperlipidemia was brought to the ER after patient was having persistent cough and shortness of breath and over the last two days has been having some occipital headache.    Danielle [name] is established with [facility] palliative care has been consulted for further goals of care conversations.  Today's Discussion 08/25/2023  *Please note that this is a verbal dictation therefore any spelling or grammatical errors are due to the "[employer] One" system interpretation.  Chart reviewed inclusive of vital signs, progress notes, laboratory results, and diagnostic images.   Severe - end stage COPD.  I met with Danielle [name], her daughter, Danielle [name], and Dr. Farrel Hones at bedside. Danielle [name] is noted to be sleeping. Per her daughter, the Seroquel overnight was quite sedating. She shares if her mother is that sleepy she does not feel she could care for her in the home as she would not be able to get her to and from the bedside commode. Dr. Farrel Hones and Danielle [name] spoke about stopping the bladder spasm medication as well - Danielle [name] feels this medication may cause more problems. Dr. Farrel Hones notes we can stop any medications which have been added - the intention was to make the evenings less burdensome. Danielle [name] notes that she feels she can handle her mothers condition as she was prior to medication adjustments. The only exception is the pain medication which will be increased as present dosing is not helping pain.   We further discussed the anticipation of patients decline in the oncoming days, weeks, and months. We reviewed the importance of tapping into hospice respite care. We reviewed when it might be time for Danielle [name] to consider inpatient hospice support for EOL care.    Created space and opportunity for Danielle [name] to explore thoughts feelings and fears regarding  her mother current medical situation. Allowed time and space for Danielle [name] to express herself. Offered empathetic support given the difficulties(s) of being a caregiver.   Danielle [name] asked if [facility] will be stopping by today. I shared that I would reach out to them for further clarity.  The Plan will be for discharge tomorrow afternoon back home with [facility].  Questions and concerns addressed/Palliative Support Provided.   Objective Assessment: Vital Signs Vitals:   08/25/23 0524 08/25/23 0806  BP: 116/85   Pulse: 60   Resp: 18   Temp: 98.6 F (37 C)   SpO2: 93% 94%    Intake/Output Summary (Last 24 hours) at 08/25/2023 1234 Last data filed at 08/25/2023 0930 Gross per 24 hour  Intake 240 ml  Output --  Net 240 ml   Last Weight  Most recent update: 08/24/2023  6:20 AM    Weight  105.3 kg (232 lb 2.3 oz)            Gen: Elderly Caucasian female chronically ill-appearing HEENT: moist mucous membranes CV: Regular rate and rhythm PULM: On 2L nasal cannula breathing is nonlabored ABD: soft/nontender EXT: Bilateral lower extremity edema Neuro: Somnolent  SUMMARY OF RECOMMENDATIONS   DNAR/DNI   Patient is established with [facility] [place] plan for her to discharge home with her services once medically optimized   At this point in time patient and her family would like to continue treatment for UTI   Primary team Stopping Seroquel and mirabegron  Glycopyrrolate  1mg  PO TID   Pulmonary hygiene through RT with aerobika   IS for lung expansion   Continue Voltaren  gel for BLE discomfort as this works at home in addition to elevation  Continue oxycodone  10mg  / 20mg  PO Q4H PRN moderate - severe pain   Plan for likely transition home tomorrow afternoon _____________________________________________________________________________ [facility]  Team Team Cell Phone: [phone] Please utilize secure chat with additional questions, if there is no response within 30 minutes please call the above phone number  Time Spent: 48 Billing based on MDM: High  Palliative Medicine Team providers are available by phone from 7am to 7pm daily and can be reached through the team cell phone.  Should this patient require assistance outside of these hours, please call the patient's attending physician.

## 2023-08-25 NOTE — Progress Notes (Addendum)
 PROGRESS NOTE    Danielle [name]  ZOX:[id number] DOB: [birth year] DOA: 08/23/2023 PCP: Danielle Hoard., MD  Subjective: Pt seen and examined. Met with pt's dtr Danielle [name] at bedside. Danielle [name] with palliative care also at bedside.  Pt is sedated from seroquel last night. Dtr states pt needs to be able to stand to use BSC or walk to go to bathroom. Dtr wants seroquel to be stopped. Also wants bladder spasm agents to be stopped. These were both meds she requested to be started yesterday, now she wants them discontinued.  Urine cx negative Plan for DC to home tomorrow with ambulance in the afternoon.   Hospital Course: HPI:  Danielle [name] is a 89 y.o. female with history of chronic respiratory failure on 2 L oxygen  secondary to COPD, history of diastolic CHF, diabetes mellitus type 2, paroxysmal atrial fibrillation, hyperlipidemia was brought to the ER after patient was having persistent cough and shortness of breath and over the last two days has been having some occipital headache.  As per the patient's daughter patient has been having persistent productive cough for the last 10 days had one course of Z-Pak following which patient was placed on Levaquin despite which patient was still having cough productive of greenish sputum.  Last two days patient has been having neck and occipital headache.  Denies any weakness of extremities.   In the month of April 2025 patient was discharged home on hospice.   ED Course: In the ER patient was afebrile.  CT angiogram head and neck and CT C-spine were unremarkable except for benign looking left parotid mass.  Chest x-ray shows increased consolidation in the left lung concerning for pneumonia.  Was started on empiric antibiotics and admitted for further observation.  Patient's headache improved after patient was given Compazine in the ER  Significant Events: Admitted 08/23/2023 acute on chronic hypoxic respiratory failure   Admission Labs: WBC 7.7,  HgB 11.2, plt 259 INR 1.0 Na 136, K 3.7, CO2 of 28, BUN 23, scr 1.01, glu 163 BNP 81.5  Admission Imaging Studies: CXR Increased density at the left lung base consistent with left lower lobe consolidation and/or effusion. 2. Stable enlarged cardiac silhouette CTA head/neck, CT c-spine No evidence of acute intracranial abnormality. 2. No large vessel occlusion or proximal  hemodynamically significant stenosis. 3. No aneurysm identified. 4. Redemonstrated left parotid mass  Significant Labs:   Significant Imaging Studies:   Antibiotic Therapy: Anti-infectives (From admission, onward)    Start     Dose/Rate Route Frequency Ordered Stop   08/25/23 1200  Vancomycin (VANCOCIN) 1,250 mg in sodium chloride  0.9 % 250 mL IVPB        1,250 mg 166.7 mL/hr over 90 Minutes Intravenous Every 36 hours 08/24/23 0203     08/24/23 1000  aztreonam (AZACTAM) 1 g in sodium chloride  0.9 % 100 mL IVPB        1 g 200 mL/hr over 30 Minutes Intravenous Every 8 hours 08/24/23 0209     08/24/23 0315  doxycycline  (VIBRAMYCIN ) 100 mg in sodium chloride  0.9 % 250 mL IVPB        100 mg 125 mL/hr over 120 Minutes Intravenous Every 12 hours 08/24/23 0217     08/24/23 0000  aztreonam (AZACTAM) 2 g in sodium chloride  0.9 % 100 mL IVPB  Status:  Discontinued        2 g 200 mL/hr over 30 Minutes Intravenous  Once 08/23/23 2353 08/23/23 2359   08/24/23 0000  vancomycin (  VANCOCIN) IVPB 1000 mg/200 mL premix        1,000 mg 200 mL/hr over 60 Minutes Intravenous  Once 08/23/23 2353 08/24/23 0120   08/24/23 0000  aztreonam (AZACTAM) 1 g in sodium chloride  0.9 % 100 mL IVPB        1 g 200 mL/hr over 30 Minutes Intravenous  Once 08/23/23 2359 08/24/23 0205       Procedures:   Consultants:     Assessment and Plan: * Acute on chronic respiratory failure with hypoxia (HCC) 08-24-2023 normally on 2 L/min O2.  Procalcitonin is negative. I don't think she has pneumonia.  Wean back to 2L/min.  08-24-2023 resolved.  Back on 2 L/min.  Hospice care patient 08-24-2023 palliative care consult for GOC discussion. Danielle [name] discussed outside pt's room. Pt has been living with Danielle [name] since discharge in April.  Pt does not sleep well at night. Is up about every 1 hour. Po haldol, ativan and oxycodone  given at night do not help pt sleep. Then pt sleeps all day long.  Danielle [name] is feeling very tired. Danielle [name]'s husband works 3rd shift so he is not home at night and he sleeps during the day.   Hospice provides help only 2 days a week.   Danielle [name] understands that pt is not going to get better and that rehab at SNF will not help.  She is prepared for the time when her mother will die.   Pt is very impatient when it comes to pain and anxiety. Pt demands that dtr relieve the pain/anxiety immediately even when dtr tries to give her po meds.   Discussed that increases in pain/anxiety/sleep meds will help with sleep hygiene. Discussed side effects and that pt may have some hangover symptoms in the AM. Discussed that Danielle [name] needs to give herself some grace. Discussed that Danielle [name] is doing all that she can for her mother and that is respectable.   Will increase pt's pain meds and add IV dilaudid  prn.     Will also start Seroquel at bedtime to help with agitation during the night.   Will also add bladder spasm agent to help with urinary frequency.  08-25-2023 Met with pt's dtr Danielle [name] at bedside. Danielle [name] with palliative care also at bedside.  Pt is sedated from seroquel last night. Dtt was aware of this side effect prior to starting seroquel last night.  Dtr states pt needs to be able to stand to use BSC or walk to go to bathroom. Dtr wants seroquel to be stopped. Also wants bladder spasm agents to be stopped. These were both meds she requested to be started yesterday, now she wants them discontinued.  Will continue increased dose of oxycodone  at night. Dtr advised not to mix ativan and oxycodone  together for patient at bedtime. Discussed that  eventually, pt will need to go to hospice house as her clinical condition decline.    Acute cystitis without hematuria 08-24-2023 continue with IV rocephin . Await culture.  08-25-2023 urine cx negative. Completed 3 days worth of abx then stop.   DNR (do not resuscitate)/DNI(Do Not Intubate) 08-24-2023 confirmed DNR/DNI status with pt's dtr Danielle [name].   Anemia 08-24-2023 stable.  08-25-2023 stable  Type 2 diabetes mellitus (HCC) 08-24-2023 on SSI.  08-25-2023 stable  Paroxysmal atrial fibrillation (HCC) 08-24-2023 on amiodarone . Not on systemic anticoagulation due to fall risk.  08-25-2023 stable  COPD (chronic obstructive pulmonary disease) (HCC) 08-24-2023 not exacerbated. Continue with nebs. Hold on systemic steroids.  08-25-2023 stable  Headache 08-24-2023 resolved after  compazine. ?? Migraine headache.  Restless legs syndrome 08-24-2023 hopefully adding seroquel at night will help.  08-25-2023 dtr wants to stop seroquel.  Class 3 obesity Body mass index is 41.12 kg/m.   Stage 3a chronic kidney disease (HCC) 08-24-2023 stable  08-25-2023 stable  Mixed hyperlipidemia 08-24-2023 stable. Continue crestor .  08-25-2023 stable  Chronic diastolic congestive heart failure (HCC) 08-24-2023 stable. Euvolemic. Continue daily lasix  po.  08-25-2023 stable  Pneumonia-resolved as of 08/24/2023 08-24-2023 I don't she has pneumonia. Procalcitonin is negative. I think pt has atelectasis from sitting in chair/bed all day. Pneumonia ruled out.  DVT prophylaxis: enoxaparin  (LOVENOX ) injection 40 mg Start: 08/24/23 1000    Code Status: Limited: Do not attempt resuscitation (DNR) -DNR-LIMITED -Do Not Intubate/DNI  Family Communication: discussed with pt's dtr Danielle [name] at bedside Disposition Plan: return home Reason for continuing need for hospitalization: remains on IV Abx. Plan for DC tomorrow afternoon with ambulance.  Objective: Vitals:   08/25/23 0524 08/25/23 0806  08/25/23 0852 08/25/23 1246  BP: 116/85   (!) 116/97  Pulse: 60   70  Resp: 18   16  Temp: 98.6 F (37 C)   98.3 F (36.8 C)  TempSrc: Oral   Oral  SpO2: 93% 94%  94%  Weight:      Height:   5\' 2"  (1.575 m)     Intake/Output Summary (Last 24 hours) at 08/25/2023 1453 Last data filed at 08/25/2023 0930 Gross per 24 hour  Intake 240 ml  Output --  Net 240 ml   Filed Weights   08/24/23 0000 08/24/23 0600  Weight: 107.5 kg 105.3 kg    Examination:  Physical Exam Vitals and nursing note reviewed.  Constitutional:      General: She is not in acute distress.    Appearance: She is obese. She is not toxic-appearing.     Comments: Chronically ill appearing  HENT:     Head: Normocephalic and atraumatic.  Cardiovascular:     Rate and Rhythm: Normal rate and regular rhythm.  Pulmonary:     Effort: Pulmonary effort is normal. No respiratory distress.  Abdominal:     General: Abdomen is protuberant. Bowel sounds are normal. There is no distension.     Palpations: Abdomen is soft.  Skin:    General: Skin is warm and dry.     Capillary Refill: Capillary refill takes less than 2 seconds.  Neurological:     Mental Status: She is disoriented.     Data Reviewed: I have personally reviewed following labs and imaging studies  CBC: Recent Labs  Lab 08/23/23 1847 08/24/23 0533  WBC 7.7 8.3  NEUTROABS 4.0  --   HGB 11.2* 10.2*  HCT 36.6 33.1*  MCV 94.8 94.6  PLT 259 238   Basic Metabolic Panel: Recent Labs  Lab 08/23/23 1847 08/24/23 0533  NA 136 133*  K 3.7 3.4*  CL 97* 95*  CO2 28 28  GLUCOSE 163* 191*  BUN 23 19  CREATININE 1.01* 1.06*  CALCIUM  9.3 8.9   GFR: Estimated Creatinine Clearance: 41.8 mL/min (A) (by C-G formula based on SCr of 1.06 mg/dL (H)). Liver Function Tests: Recent Labs  Lab 08/23/23 1847  AST 29  ALT 23  ALKPHOS 84  BILITOT 0.5  PROT 7.1  ALBUMIN 3.4*   Coagulation Profile: Recent Labs  Lab 08/23/23 1847  INR 1.0   BNP (last 3  results) Recent Labs    06/13/23 1136 07/12/23 1014 08/23/23 1847  BNP 120.6* 149.7* 81.5  CBG: Recent Labs  Lab 08/24/23 1138 08/24/23 1652 08/24/23 2132 08/25/23 0740 08/25/23 1119  GLUCAP 177* 142* 186* 169* 151*   Sepsis Labs: Recent Labs  Lab 08/23/23 1901 08/23/23 2053 08/24/23 0530  PROCALCITON  --   --  <0.10  LATICACIDVEN 2.0* 1.9  --     Recent Results (from the past 240 hours)  Urine Culture     Status: Abnormal   Collection Time: 08/23/23  9:41 AM   Specimen: Urine, Random  Result Value Ref Range Status   Specimen Description   Final    URINE, RANDOM Performed at [facility], [address] [place], [place] [address]    Special Requests   Final    NONE Reflexed from [id number] Performed at [facility], [address] [place], [place] [address]    Culture (A)  Final    <10,000 COLONIES/mL INSIGNIFICANT GROWTH Performed at [facility], [address] [place], [place] [address]    Report Status 08/25/2023 FINAL  Final  Blood Culture (routine x 2)     Status: None (Preliminary result)   Collection Time: 08/23/23  6:52 PM   Specimen: BLOOD  Result Value Ref Range Status   Specimen Description   Final    BLOOD LEFT ANTECUBITAL Performed at [facility], [address], [place], [place] [address]    Special Requests   Final    BOTTLES DRAWN AEROBIC AND ANAEROBIC Blood Culture results may not be optimal due to an inadequate volume of blood received in culture bottles Performed at [facility], [address] [place], [place] [address]    Culture   Final    NO GROWTH 2 DAYS Performed at [facility], [address] [place], [place] [address]    Report Status PENDING  Incomplete  Blood Culture (routine x 2)     Status: None (Preliminary result)   Collection Time: 08/23/23  6:59 PM   Specimen: BLOOD  Result Value Ref Range Status   Specimen Description    Final    BLOOD RIGHT ANTECUBITAL Performed at [facility], [address] [place], [place] [address]    Special Requests   Final    BOTTLES DRAWN AEROBIC AND ANAEROBIC Blood Culture results may not be optimal due to an inadequate volume of blood received in culture bottles Performed at [facility], [address], [place], [place] [address]    Culture   Final    NO GROWTH 2 DAYS Performed at [facility], [address] [place], [place] [address]    Report Status PENDING  Incomplete     Radiology Studies: CT ANGIO HEAD NECK W WO CM Result Date: 08/23/2023 CLINICAL DATA:  Headache, sudden, severe EXAM: CT ANGIOGRAPHY HEAD AND NECK WITH AND WITHOUT CONTRAST TECHNIQUE: Multidetector CT imaging of the head and neck was performed using the standard protocol during bolus administration of intravenous contrast. Multiplanar CT image reconstructions and MIPs were obtained to evaluate the vascular anatomy. Carotid stenosis measurements (when applicable) are obtained utilizing NASCET criteria, using the distal internal carotid diameter as the denominator. RADIATION DOSE REDUCTION: This exam was performed according to the departmental dose-optimization program which includes automated exposure control, adjustment of the mA and/or kV according to patient size and/or use of iterative reconstruction technique. CONTRAST:  75mL OMNIPAQUE  IOHEXOL  350 MG/ML SOLN COMPARISON:  CT head March 15, 25. FINDINGS: CT HEAD FINDINGS Brain: No  evidence of acute infarction, hemorrhage, hydrocephalus, extra-axial collection or mass lesion/mass effect. Vascular: See below. Skull: No acute fracture. Sinuses/Orbits: Clear sinuses.  No acute orbital findings. Other: No mastoid effusions. Review of the MIP images confirms the above findings CTA NECK FINDINGS Aortic arch: Aortic atherosclerosis. Great vessel origins are patent. Right carotid system: No evidence of dissection, stenosis (50%  or greater), or occlusion. Left carotid system: No evidence of dissection, stenosis (50% or greater), or occlusion. Vertebral arteries: Codominant. No evidence of dissection, stenosis (50% or greater), or occlusion. Skeleton: Negative. Other neck: Redemonstrated left parotid mass. Upper chest: Visualized lung apices are clear. Review of the MIP images confirms the above findings CTA HEAD FINDINGS Anterior circulation: No significant stenosis, proximal occlusion, aneurysm, or vascular malformation. No aneurysm identified. Posterior circulation: No significant stenosis, proximal occlusion, aneurysm, or vascular malformation. No aneurysm identified. Venous sinuses: As permitted by contrast timing, patent. Review of the MIP images confirms the above findings IMPRESSION: 1. No evidence of acute intracranial abnormality. 2. No large vessel occlusion or proximal hemodynamically significant stenosis. 3. No aneurysm identified. 4. Redemonstrated left parotid mass. Electronically Signed   By: Stevenson Elbe M.D.   On: 08/23/2023 22:16   CT C-SPINE NO CHARGE Result Date: 08/23/2023 CLINICAL DATA:  Neck pain, acute, no red flags : headache, sudden, severe; neck pain, acute, no red flags Chief complaints: Respiratory distress, weakness EXAM: CT CERVICAL SPINE WITHOUT CONTRAST TECHNIQUE: Multidetector CT imaging of the cervical spine was performed without intravenous contrast. Multiplanar CT image reconstructions were also generated. RADIATION DOSE REDUCTION: This exam was performed according to the departmental dose-optimization program which includes automated exposure control, adjustment of the mA and/or kV according to patient size and/or use of iterative reconstruction technique. COMPARISON:  CT head and C-spine 06/13/2023. CT head 02/08/2018 FINDINGS: Alignment: Grade 1 anterolisthesis of C2 on C3, C3 on C4, C4 on C5. Skull base and vertebrae: C5 through C7 moderate degenerative changes of the spine. No associated  severe osseous neural foraminal or central canal stenosis. No acute fracture. No aggressive appearing focal osseous lesion or focal pathologic process. Soft tissues and spinal canal: No prevertebral fluid or swelling. No visible canal hematoma. Upper chest: Unremarkable. Other: Atherosclerotic plaque. A 1.6 x 1.4 cm well-circumscribed homogeneous left parotid gland lesion. IMPRESSION: 1. No acute displaced fracture or traumatic listhesis of the cervical spine. 2. A 1.6 x 1.4 cm well-circumscribed homogeneous left parotid gland lesion. 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 Recommend attention on follow-up. Electronically Signed   By: Morgane  Naveau M.D.   On: 08/23/2023 21:48   DG Chest Port 1 View Result Date: 08/23/2023 CLINICAL DATA:  Sepsis, hypotension EXAM: PORTABLE CHEST 1 VIEW COMPARISON:  07/12/2023 FINDINGS: Single frontal view of the chest was obtained with the patient rotated toward the left. Cardiac silhouette is enlarged. Increased density within the left lower chest obscures the left hemidiaphragm, consistent with consolidation and/or effusion. The right chest is clear. No pneumothorax. No acute bony abnormalities. IMPRESSION: 1. Increased density at the left lung base consistent with left lower lobe consolidation and/or effusion. 2. Stable enlarged cardiac silhouette. Electronically Signed   By: Bobbye Burrow M.D.   On: 08/23/2023 19:13    Scheduled Meds:  amiodarone   200 mg Oral Daily   budesonide  (PULMICORT ) nebulizer solution  0.25 mg Nebulization BID   citalopram   20 mg Oral Daily   enoxaparin  (LOVENOX ) injection  40 mg Subcutaneous Q24H   furosemide   40 mg Oral Daily   gabapentin   100  mg Oral TID   glycopyrrolate   1 mg Oral TID   insulin  aspart  0-9 Units Subcutaneous TID WC   ipratropium-albuterol   3 mL Nebulization Q4H   lidocaine   1 patch Transdermal Q24H   pantoprazole   40 mg Oral BID   rOPINIRole   0.25 mg Oral QHS   rosuvastatin   40 mg Oral Daily    Continuous Infusions:  cefTRIAXone  (ROCEPHIN )  IV 1 g (08/25/23 0801)     LOS: 1 day   Time spent: 50 minutes  Unk Garb, DO  [facility]  08/25/2023, 2:53 PM

## 2023-08-26 DIAGNOSIS — J9611 Chronic respiratory failure with hypoxia: Secondary | ICD-10-CM

## 2023-08-26 DIAGNOSIS — J449 Chronic obstructive pulmonary disease, unspecified: Secondary | ICD-10-CM

## 2023-08-26 DIAGNOSIS — Z515 Encounter for palliative care: Secondary | ICD-10-CM

## 2023-08-26 DIAGNOSIS — R338 Other retention of urine: Secondary | ICD-10-CM | POA: Diagnosis not present

## 2023-08-26 LAB — GLUCOSE, CAPILLARY
Glucose-Capillary: 174 mg/dL — ABNORMAL HIGH (ref 70–99)
Glucose-Capillary: 219 mg/dL — ABNORMAL HIGH (ref 70–99)
Glucose-Capillary: 158 mg/dL — ABNORMAL HIGH (ref 70–99)
Glucose-Capillary: 187 mg/dL — ABNORMAL HIGH (ref 70–99)

## 2023-08-26 MED ORDER — ACETAMINOPHEN 500 MG PO TABS: 1000.0000 mg | ORAL_TABLET | Freq: Three times a day (TID) | ORAL | Status: DC | PRN

## 2023-08-26 MED ORDER — CHLORHEXIDINE GLUCONATE CLOTH 2 % EX PADS
6.0000 | MEDICATED_PAD | Freq: Every day | CUTANEOUS | Status: DC
  Administered 2023-08-26: 6 via TOPICAL

## 2023-08-26 MED ORDER — GLYCOPYRROLATE 1 MG PO TABS: 1.0000 mg | ORAL_TABLET | Freq: Three times a day (TID) | ORAL | 0 refills | Status: DC

## 2023-08-26 NOTE — TOC Transition Note (Signed)
 Transition of Care [facility]) - Discharge Note   Patient Details  Name: Danielle [name] MRN: [id number] Date of Birth: May 02, 1933  Transition of Care [facility]) CM/SW Contact:  Loreda Rodriguez, RN Phone [phone]  08/26/2023, 3:48 PM   Clinical Narrative:    Patient with discharge orders. Plan is to discharge home with daughter and Hospice services. Address has been verified per daughter. Transportation has been arranged via [facility]. D/c packet is at nurses station.    Final next level of care: Home w Hospice Care [facility]) Barriers to Discharge: No Barriers Identified   Patient Goals and CMS Choice     Choice offered to / list presented to : NA Downs ownership interest in [facility].provided to::  (n/a)    Discharge Placement                  Name of family member notified: Adonica Ala [phone] Patient and family notified of of transfer: 08/26/23  Discharge Plan and Services Additional resources added to the After Visit Summary for                  DME Arranged: N/A DME Agency: NA       HH Arranged: NA HH Agency: NA        Social Drivers of Health (SDOH) Interventions SDOH Screenings   Food Insecurity: No Food Insecurity (08/24/2023)  Housing: Low Risk  (08/24/2023)  Transportation Needs: No Transportation Needs (08/24/2023)  Utilities: Not At Risk (08/24/2023)  Social Connections: Moderately Isolated (08/24/2023)  Tobacco Use: Medium Risk (08/24/2023)     Readmission Risk Interventions    08/26/2023    3:39 PM 07/14/2023   12:45 PM 06/15/2023   10:32 AM  Readmission Risk Prevention Plan  Transportation Screening Complete Complete Complete  PCP or Specialist Appt within 3-5 Days Complete  Complete  HRI or Home Care Consult Complete  Complete  Social Work Consult for Recovery Care Planning/Counseling Complete  Complete  Palliative Care Screening Not Applicable  Not Applicable  Medication Review [employer])  Complete Complete Complete  PCP or Specialist appointment within 3-5 days of discharge  Complete   HRI or Home Care Consult  Complete   SW Recovery Care/Counseling Consult  Complete   Palliative Care Screening  Complete   Skilled Nursing Facility  Patient Refused

## 2023-08-26 NOTE — Discharge Summary (Signed)
 Physician Discharge Summary  Danielle [name] UEA:[id number] DOB: 10/06/1933  PCP: Abbe Hoard., MD  Admitted from: Home with hospice Discharged to: Home with hospice  Admit date: 08/23/2023 Discharge date: 08/26/2023  Recommendations for Outpatient Follow-up:    Follow-up Information     Abbe Hoard., MD. Schedule an appointment as soon as possible for a visit.   Specialty: Internal Medicine Why: Patient or family can call as needed for an appointment. Contact information: [address] [place] [address] [phone]                  Home Health: None    Equipment/Devices: None    Discharge Condition: Guarded with overall poor prognosis.   Code Status: Limited: Do not attempt resuscitation (DNR) -DNR-LIMITED -Do Not Intubate/DNI  Diet recommendation:  Discharge Diet Orders (From admission, onward)     Start     Ordered   08/25/23 0000  Diet - low sodium heart healthy        08/25/23 1219   08/25/23 0000  Diet Carb Modified        08/25/23 1219             Discharge Diagnoses:  Principal Problem:   Acute on chronic respiratory failure with hypoxia (HCC) Active Problems:   Hospice care patient   Acute cystitis without hematuria   Chronic diastolic congestive heart failure (HCC)   Mixed hyperlipidemia   Stage 3a chronic kidney disease (HCC)   Class 3 obesity   Restless legs syndrome   Headache   COPD (chronic obstructive pulmonary disease) (HCC)   Paroxysmal atrial fibrillation (HCC)   Type 2 diabetes mellitus (HCC)   Anemia   DNR (do not resuscitate)/DNI(Do Not Intubate)   Brief Summary: HPI:  Danielle [name] is a 89 y.o. female, stays with her daughter, with history of chronic respiratory failure on 2 L oxygen  secondary to COPD, history of chronic diastolic CHF, diabetes mellitus type 2, paroxysmal atrial fibrillation, hyperlipidemia who was brought to the ER after patient was having persistent cough and shortness of  breath and over the last two days has been having some occipital headache.  As per the patient's daughter patient has been having persistent productive cough for the last 10 days had one course of Z-Pak following which patient was placed on Levaquin despite which patient was still having cough productive of greenish sputum.  Last two days patient has been having neck and occipital headache.  Denies any weakness of extremities.   In the month of April 2025 patient was discharged home on hospice.   ED Course: In the ER patient was afebrile.  CT angiogram head and neck and CT C-spine were unremarkable except for benign looking left parotid mass.  Chest x-ray shows increased consolidation in the left lung concerning for pneumonia.  Was started on empiric antibiotics and admitted for further observation.  Patient's headache improved after patient was given Compazine in the ER  Assessment and Plan:  Chronic respiratory failure with hypoxia PTA, patient on home oxygen  2 L/min.  Since hospital admission, did not notice any sustained tachypnea.  Was on 4 L/min [place] oxygen  initially but since then has been between 2-3 L/min.  Does not meet criteria for acute on chronic respiratory failure with hypoxia. Continue home [place] oxygen  for comfort It was felt by prior rounding MDs that patient did not have a pneumonia, procalcitonin was negative.  She however completed 4 days of IV antibiotics including aztreonam, ceftriaxone ,  IV doxycycline  and a dose of IV vancomycin . Palliative care team saw her on day prior to discharge.  She is already [facility] with [facility].  She will be discharging back on home hospice Palliative care initiated glycopyrrolate  1 Mg p.o. 3 times daily for upper airway secretions, continue at discharge.   Asymptomatic bacteriuria Urine culture showed insignificant growth.  This is likely asymptomatic bacteriuria rather than acute cystitis.   Anemia Stable.   Type 2 diabetes  mellitus (HCC) Fluctuating CBGs.  No aggressive intervention.  Continue prior home dose of oral diabetic meds.   Paroxysmal atrial fibrillation (HCC) Continue on amiodarone . Not on systemic anticoagulation due to fall risk. Stable   End-stage COPD (chronic obstructive pulmonary disease) (HCC) Stable without clinical bronchospasm.  Continue prior home inhaled meds.   Headache Resolved after compazine . ?? Migraine headache.   Restless legs syndrome Seroquel  was initiated and stopped at patient's daughter's request.   Class 3 obesity Body mass index is 41.12 kg/m.    Stage 3a chronic kidney disease (HCC) Stable.   Mixed hyperlipidemia Patient has not been taking Crestor .   Chronic diastolic congestive heart failure (HCC) Clinically euvolemic.  Continue prior home dose of diuretics.  Acute urinary retention Had difficulty urinating and bladder scan showed 389 mL.  Advised placement of Foley catheter and discharge home with same for comfort and management of urinary retention.   Consultations: Palliative care team  Procedures: None   Discharge Instructions  Discharge Instructions     (HEART FAILURE PATIENTS) Call MD:  Anytime you have any of the following symptoms: 1) 3 pound weight gain in 24 hours or 5 pounds in 1 week 2) shortness of breath, with or without a dry hacking cough 3) swelling in the hands, feet or stomach 4) if you have to sleep on extra pillows at night in order to breathe.   Complete by: As directed    Call MD for:  difficulty breathing, headache or visual disturbances   Complete by: As directed    Call MD for:  extreme fatigue   Complete by: As directed    Call MD for:  hives   Complete by: As directed    Call MD for:  persistant dizziness or light-headedness   Complete by: As directed    Call MD for:  persistant nausea and vomiting   Complete by: As directed    Call MD for:  severe uncontrolled pain   Complete by: As directed    Call MD for:   temperature >100.4   Complete by: As directed    Diet - low sodium heart healthy   Complete by: As directed    Diet Carb Modified   Complete by: As directed    Discharge instructions   Complete by: As directed    1. Follow up with your primary care provider in 1-2 weeks following discharge from hospital. 2. Follow up with your hospice agency for additional services if needed.   Increase activity slowly   Complete by: As directed         Medication List     STOP taking these medications    fluconazole 150 MG tablet Commonly known as: DIFLUCAN   glimepiride  2 MG tablet Commonly known as: AMARYL    hyoscyamine 0.125 MG Tbdp disintergrating tablet Commonly known as: ANASPAZ   levofloxacin 500 MG tablet Commonly known as: LEVAQUIN   nystatin  cream Commonly known as: MYCOSTATIN    nystatin  powder Commonly known as: MYCOSTATIN /NYSTOP    oxyBUTYnin  Chloride 2.5 MG Tabs   rosuvastatin  40 MG tablet Commonly known as: CRESTOR    Vitamin D  (Ergocalciferol ) 1.25 MG (50000 UNIT) Caps capsule Commonly known as: DRISDOL        TAKE these medications    acetaminophen  500 MG tablet Commonly known as: TYLENOL  Take 2 tablets (1,000 mg total) by mouth every 8 (eight) hours as needed for mild pain (pain score 1-3), headache or fever. What changed:  when to take this reasons to take this   albuterol  108 (90 Base) MCG/ACT inhaler Commonly known as: VENTOLIN  HFA Inhale 2 puffs into the lungs every 6 (six) hours as needed for wheezing or shortness of breath.   amiodarone  200 MG tablet Commonly known as: PACERONE  Take 1 tablet (200 mg total) by mouth daily.   budesonide -formoterol  160-4.5 MCG/ACT inhaler Commonly known as: SYMBICORT  Inhale 2 puffs into the lungs 2 (two) times daily.   carboxymethylcellulose 0.5 % Soln Commonly known as: REFRESH PLUS Place 1 drop into both eyes daily as needed (dry eyes).   citalopram  20 MG tablet Commonly known as: CELEXA  Take 20 mg by  mouth daily.   CRANBERRY PO Take 1 tablet by mouth daily.   CULTRELLE KIDS IMMUNE DEFENSE PO Take 1 Dose by mouth daily.   diclofenac  Sodium 1 % Gel Commonly known as: VOLTAREN  APPLY 2 GRAMS TOPICALLY TO AFFECTED AREA THREE TIMES DAILY What changed: See the new instructions.   ferrous sulfate  325 (65 FE) MG EC tablet Take 325 mg by mouth daily with breakfast.   furosemide  20 MG tablet Commonly known as: LASIX  Take 2 tablets (40 mg total) by mouth daily. What changed:  how much to take when to take this additional instructions   gabapentin  100 MG capsule Commonly known as: NEURONTIN  Take 1 capsule (100 mg total) by mouth 3 (three) times daily. What changed:  how much to take when to take this additional instructions   glycopyrrolate  1 MG tablet Commonly known as: ROBINUL  Take 1 tablet (1 mg total) by mouth 3 (three) times daily.   haloperidol 1 MG tablet Commonly known as: HALDOL Take 1 mg by mouth at bedtime.   ipratropium-albuterol  0.5-2.5 (3) MG/3ML Soln Commonly known as: DUONEB Take 3 mLs by nebulization in the morning, at noon, and at bedtime.   loratadine  10 MG tablet Commonly known as: CLARITIN  Take 10 mg by mouth daily.   LORazepam 0.5 MG tablet Commonly known as: ATIVAN Take 0.5 mg by mouth at bedtime. May take an additional 0.5 mg by mouth for anxiety   melatonin 3 MG Tabs tablet Take 6 mg by mouth at bedtime.   metFORMIN  500 MG tablet Commonly known as: GLUCOPHAGE  Take 500 mg by mouth daily with breakfast.   Mucinex  D Max Strength [phone] MG Tb12 Generic drug: Pseudoephedrine-Guaifenesin  Take 1,200 mg by mouth daily.   nitroGLYCERIN  0.4 MG SL tablet Commonly known as: Nitrostat  Place 1 tablet (0.4 mg total) under the tongue every 5 (five) minutes as needed for chest pain.   oxyCODONE  5 MG immediate release tablet Commonly known as: Oxy IR/ROXICODONE  Take 1-2 tablets (5-10 mg total) by mouth every 4 (four) hours as needed for up to 5  days for moderate pain (pain score 4-6) or severe pain (pain score 7-10). What changed:  how much to take reasons to take this   pantoprazole  40 MG tablet Commonly known as: PROTONIX  Take 40 mg by mouth 2 (two) times daily.   potassium chloride  10 MEQ CR capsule Commonly known as: MICRO-K  Take  10 mEq by mouth daily.   prochlorperazine  10 MG tablet Commonly known as: COMPAZINE  Take 10 mg by mouth every 6 (six) hours as needed for nausea.   rOPINIRole  0.25 MG tablet Commonly known as: REQUIP  Take 0.25 mg by mouth at bedtime.   senna 8.6 MG Tabs tablet Commonly known as: SENOKOT Take 2 tablets by mouth at bedtime.   sitaGLIPtin 100 MG tablet Commonly known as: JANUVIA Take 100 mg by mouth daily.       Allergies  Allergen Reactions   Codeine Swelling   Penicillins Swelling   Xarelto  [Rivaroxaban ] Other (See Comments)    Causes bleeding in stool and urine   Levofloxacin Other (See Comments)     Malaise (intolerance)   Tape Rash    Has to have paper tape       Procedures/Studies: CT ANGIO HEAD NECK W WO CM Result Date: 08/23/2023 CLINICAL DATA:  Headache, sudden, severe EXAM: CT ANGIOGRAPHY HEAD AND NECK WITH AND WITHOUT CONTRAST TECHNIQUE: Multidetector CT imaging of the head and neck was performed using the standard protocol during bolus administration of intravenous contrast. Multiplanar CT image reconstructions and MIPs were obtained to evaluate the vascular anatomy. Carotid stenosis measurements (when applicable) are obtained utilizing NASCET criteria, using the distal internal carotid diameter as the denominator. RADIATION DOSE REDUCTION: This exam was performed according to the departmental dose-optimization program which includes automated exposure control, adjustment of the mA and/or kV according to patient size and/or use of iterative reconstruction technique. CONTRAST:  75mL OMNIPAQUE  IOHEXOL  350 MG/ML SOLN COMPARISON:  CT head March 15, 25. FINDINGS: CT HEAD  FINDINGS Brain: No evidence of acute infarction, hemorrhage, hydrocephalus, extra-axial collection or mass lesion/mass effect. Vascular: See below. Skull: No acute fracture. Sinuses/Orbits: Clear sinuses.  No acute orbital findings. Other: No mastoid effusions. Review of the MIP images confirms the above findings CTA NECK FINDINGS Aortic arch: Aortic atherosclerosis. Great vessel origins are patent. Right carotid system: No evidence of dissection, stenosis (50% or greater), or occlusion. Left carotid system: No evidence of dissection, stenosis (50% or greater), or occlusion. Vertebral arteries: Codominant. No evidence of dissection, stenosis (50% or greater), or occlusion. Skeleton: Negative. Other neck: Redemonstrated left parotid mass. Upper chest: Visualized lung apices are clear. Review of the MIP images confirms the above findings CTA HEAD FINDINGS Anterior circulation: No significant stenosis, proximal occlusion, aneurysm, or vascular malformation. No aneurysm identified. Posterior circulation: No significant stenosis, proximal occlusion, aneurysm, or vascular malformation. No aneurysm identified. Venous sinuses: As permitted by contrast timing, patent. Review of the MIP images confirms the above findings IMPRESSION: 1. No evidence of acute intracranial abnormality. 2. No large vessel occlusion or proximal hemodynamically significant stenosis. 3. No aneurysm identified. 4. Redemonstrated left parotid mass. Electronically Signed   By: Stevenson Elbe M.D.   On: 08/23/2023 22:16   CT C-SPINE NO CHARGE Result Date: 08/23/2023 CLINICAL DATA:  Neck pain, acute, no red flags : headache, sudden, severe; neck pain, acute, no red flags Chief complaints: Respiratory distress, weakness EXAM: CT CERVICAL SPINE WITHOUT CONTRAST TECHNIQUE: Multidetector CT imaging of the cervical spine was performed without intravenous contrast. Multiplanar CT image reconstructions were also generated. RADIATION DOSE REDUCTION: This  exam was performed according to the departmental dose-optimization program which includes automated exposure control, adjustment of the mA and/or kV according to patient size and/or use of iterative reconstruction technique. COMPARISON:  CT head and C-spine 06/13/2023. CT head 02/08/2018 FINDINGS: Alignment: Grade 1 anterolisthesis of C2 on C3, C3 on C4, C4  on C5. Skull base and vertebrae: C5 through C7 moderate degenerative changes of the spine. No associated severe osseous neural foraminal or central canal stenosis. No acute fracture. No aggressive appearing focal osseous lesion or focal pathologic process. Soft tissues and spinal canal: No prevertebral fluid or swelling. No visible canal hematoma. Upper chest: Unremarkable. Other: Atherosclerotic plaque. A 1.6 x 1.4 cm well-circumscribed homogeneous left parotid gland lesion. IMPRESSION: 1. No acute displaced fracture or traumatic listhesis of the cervical spine. 2. A 1.6 x 1.4 cm well-circumscribed homogeneous left parotid gland lesion. 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 Recommend attention on follow-up. Electronically Signed   By: Morgane  Naveau M.D.   On: 08/23/2023 21:48   DG Chest Port 1 View Result Date: 08/23/2023 CLINICAL DATA:  Sepsis, hypotension EXAM: PORTABLE CHEST 1 VIEW COMPARISON:  07/12/2023 FINDINGS: Single frontal view of the chest was obtained with the patient rotated toward the left. Cardiac silhouette is enlarged. Increased density within the left lower chest obscures the left hemidiaphragm, consistent with consolidation and/or effusion. The right chest is clear. No pneumothorax. No acute bony abnormalities. IMPRESSION: 1. Increased density at the left lung base consistent with left lower lobe consolidation and/or effusion. 2. Stable enlarged cardiac silhouette. Electronically Signed   By: Bobbye Burrow M.D.   On: 08/23/2023 19:13      Subjective: Patient interviewed and evaluated along with her daughter and 2  nurses at bedside who are providing care.  Earlier she had difficulty urinating and nursing were yet to place a Foley catheter.  Denies dyspnea or pain.  Wants to go home.  Per nursing, upper airway secretions and has had a couple of breathing treatments today.  Discharge Exam:  Vitals:   08/25/23 2141 08/26/23 0802 08/26/23 0812 08/26/23 1307  BP: 105/69 126/73  121/69  Pulse: (!) 59 73  75  Resp: 18 20  17   Temp: 99 F (37.2 C)   99.5 F (37.5 C)  TempSrc: Oral   Oral  SpO2: 97% 93% 99% (!) 87%  Weight:      Height:        General: Elderly female, moderately built and obese, chronically ill looking lying comfortably propped up in bed without distress. Cardiovascular: S1 & S2 heard, RRR, S1/S2 +. No murmurs, rubs, gallops or clicks. No JVD or pedal edema. Respiratory: Clear to auscultation without wheezing, rhonchi or crackles. No increased work of breathing.  Was on [place] oxygen  3 L/min. Abdominal:  Non distended, non tender & soft. No organomegaly or masses appreciated. Normal bowel sounds heard. CNS: Alert and oriented. No focal deficits. Extremities: no edema, no cyanosis    The results of significant diagnostics from this hospitalization (including imaging, microbiology, ancillary and laboratory) are listed below for reference.     Microbiology: Recent Results (from the past 240 hours)  Urine Culture     Status: Abnormal   Collection Time: 08/23/23  9:41 AM   Specimen: Urine, Random  Result Value Ref Range Status   Specimen Description   Final    URINE, RANDOM Performed at [facility], [address] [place], [place] [address]    Special Requests   Final    NONE Reflexed from [id number] Performed at [facility], [address] [place], [place] [address]    Culture (A)  Final    <10,000 COLONIES/mL INSIGNIFICANT GROWTH Performed at [facility], [address], [place], [place] [address]    Report Status 08/25/2023 FINAL  Final  Blood Culture (routine x 2)     Status: None (Preliminary result)   Collection Time: 08/23/23  6:52 PM   Specimen: BLOOD  Result Value Ref Range Status   Specimen Description   Final    BLOOD LEFT ANTECUBITAL Performed at [facility], [address], [place], [place] [address]    Special Requests   Final    BOTTLES DRAWN AEROBIC AND ANAEROBIC Blood Culture results may not be optimal due to an inadequate volume of blood received in culture bottles Performed at [facility], [address] [place], [place] [address]    Culture   Final    NO GROWTH 3 DAYS Performed at [facility], [address], [place], [place] [address]    Report Status PENDING  Incomplete  Blood Culture (routine x 2)     Status: None (Preliminary result)   Collection Time: 08/23/23  6:59 PM   Specimen: BLOOD  Result Value Ref Range Status   Specimen Description   Final    BLOOD RIGHT ANTECUBITAL Performed at [facility], [address], [place], [place] [address]    Special Requests   Final    BOTTLES DRAWN AEROBIC AND ANAEROBIC Blood Culture results may not be optimal due to an inadequate volume of blood received in culture bottles Performed at [facility], [address], [place], [place] [address]    Culture   Final    NO GROWTH 3 DAYS Performed at [facility], [address] [place], [place] [address]    Report Status PENDING  Incomplete     Labs: CBC: Recent Labs  Lab 08/23/23 1847 08/24/23 0533  WBC 7.7 8.3  NEUTROABS 4.0  --   HGB 11.2* 10.2*  HCT 36.6 33.1*  MCV 94.8 94.6  PLT 259 238    Basic Metabolic Panel: Recent Labs  Lab 08/23/23 1847 08/24/23 0533  NA 136 133*  K 3.7 3.4*  CL 97* 95*  CO2 28 28  GLUCOSE 163* 191*  BUN 23 19  CREATININE 1.01* 1.06*  CALCIUM  9.3 8.9    Liver Function Tests: Recent Labs  Lab 08/23/23 1847  AST 29  ALT 23  ALKPHOS 84  BILITOT 0.5   PROT 7.1  ALBUMIN 3.4*    CBG: Recent Labs  Lab 08/25/23 1119 08/25/23 1620 08/25/23 2201 08/26/23 0722 08/26/23 1220  GLUCAP 151* 206* 175* 174* 219*    Urinalysis    Component Value Date/Time   COLORURINE STRAW (A) 08/23/2023 0941   APPEARANCEUR HAZY (A) 08/23/2023 0941   APPEARANCEUR Hazy (A) 05/13/2023 1426   LABSPEC 1.009 08/23/2023 0941   PHURINE 5.0 08/23/2023 0941   GLUCOSEU NEGATIVE 08/23/2023 0941   HGBUR MODERATE (A) 08/23/2023 0941   BILIRUBINUR NEGATIVE 08/23/2023 0941   BILIRUBINUR Negative 05/13/2023 1426   KETONESUR NEGATIVE 08/23/2023 0941   PROTEINUR NEGATIVE 08/23/2023 0941   UROBILINOGEN 1.0 02/12/2015 1105   NITRITE NEGATIVE 08/23/2023 0941   LEUKOCYTESUR LARGE (A) 08/23/2023 0941    Discussed in detail with patient's daughter at bedside.  Time coordinating discharge: 40 minutes  SIGNED:  Aubrey Blas, MD,  FACP, [facility], [facility], [facility]   [facility] [place]     To contact the attending provider between 7A-7P or the covering provider during after hours 7P-7A, please log into the web site [email or web address] and access using universal [place] password for that web site. If you do not  have the password, please call the hospital operator.

## 2023-08-26 NOTE — Discharge Instructions (Signed)

## 2023-08-26 NOTE — Progress Notes (Signed)
   Spoke to the daughter [name] and she is feeling better about her mothers condition today. She has stated that the pt will d/c home today and she is prepared to receive her. She has the after hours number to contact [facility] if she needs assistance or questions after hours. Her case manager with Hospice will plan to see her tomorrow at the home. If she is home by 400pm today she will plan to see her at home today.    Lyla Samuels RN [phone]

## 2023-08-26 NOTE — Care Management Important Message (Signed)
 Important Message  Patient Details  Name: Danielle [name] MRN: [id number] Date of Birth: 24-Jun-1933   Important Message Given:  N/A - LOS <3 / Initial given by admissions     Neila Bally 08/26/2023, 2:30 PM

## 2023-08-26 NOTE — Progress Notes (Signed)
 PTAR arrived for patient. 400cc emptied from foley, 2L [place] transferred over to portable tank and patient in no visible signs of distress. Patient's daughter called and notified of her discharge.

## 2023-08-26 NOTE — Progress Notes (Signed)
   Palliative Medicine Inpatient Follow Up Note HPI: Danielle [name] is a 89 y.o. female with history of chronic respiratory failure on 2 L oxygen  secondary to COPD, history of diastolic CHF, diabetes mellitus type 2, paroxysmal atrial fibrillation, hyperlipidemia was brought to the ER after patient was having persistent cough and shortness of breath and over the last two days has been having some occipital headache.    Danielle [name] is established with [facility]-inpatient palliative care has been consulted for further goals of care conversations.  Today's Discussion 08/26/2023  *Please note that this is a verbal dictation therefore any spelling or grammatical errors are due to the "Dragon Medical One" system interpretation.  Chart reviewed inclusive of vital signs, progress notes, laboratory results, and diagnostic images.   Severe - end stage COPD.  I met with Danielle [name], her daughter, Danielle [name] at bedside this afternoon. Danielle [name] shares concern(s) related to patient decrease alertness, increase shaking, and worsened respiratory status. Discussed that patient will transition home this afternoon.  Reviewed with patient once she awakened and her daughter the idea of comfort care and inpatient hospice transition.   Patient and her daughter feel this would be best though realize at this time she is still eating.   Spoke with hospice liaison Cheri for further follow up in regards to potential IP hospice. She shared that the Hospice RN will evaluate.   Questions and concerns addressed/Palliative Support Provided.   Objective Assessment: Vital Signs Vitals:   08/26/23 0812 08/26/23 1307  BP:  121/69  Pulse:  75  Resp:  17  Temp:  99.5 F (37.5 C)  SpO2: 99% (!) 87%    Intake/Output Summary (Last 24 hours) at 08/26/2023 1427 Last data filed at 08/26/2023 1244 Gross per 24 hour  Intake 480 ml  Output 750 ml  Net -270 ml   Last Weight  Most recent update: 08/24/2023  6:20 AM     Weight  105.3 kg (232 lb 2.3 oz)            Gen: Elderly Caucasian female chronically ill-appearing HEENT: moist mucous membranes CV: Regular rate and rhythm PULM: On 3L nasal cannula breathing is nonlabored ABD: soft/nontender EXT: Bilateral lower extremity edema Neuro: Somnolent  SUMMARY OF RECOMMENDATIONS   DNAR/DNI   Patient is established with [facility] plan for her to discharge home     Glycopyrrolate  1mg  PO TID   Pulmonary hygiene through RT with [place]   IS for lung expansion   Continue Voltaren  gel for BLE discomfort as this works at home in addition to elevation  Continue oxycodone  10mg  / 20mg  PO Q4H PRN moderate - severe pain   Plan for likely transition home this afternoon _____________________________________________________________________________ Danielle [name] [place] Palliative Medicine Team Team Cell Phone: [phone] Please utilize secure chat with additional questions, if there is no response within 30 minutes please call the above phone number  Billing based on MDM: High  Palliative Medicine Team providers are available by phone from 7am to 7pm daily and can be reached through the team cell phone.  Should this patient require assistance outside of these hours, please call the patient's attending physician.

## 2023-08-26 NOTE — Plan of Care (Signed)
 Problem: Education: Goal: Knowledge of General Education information will improve Description: Including pain rating scale, medication(s)/side effects and non-pharmacologic comfort measures 08/26/2023 1420 by Naida Escalante D, RN Outcome: Adequate for Discharge 08/26/2023 1420 by Artemio Bilberry, RN Outcome: Not Progressing   Problem: Health Behavior/Discharge Planning: Goal: Ability to manage health-related needs will improve 08/26/2023 1420 by Artemio Bilberry, RN Outcome: Adequate for Discharge 08/26/2023 1420 by Artemio Bilberry, RN Outcome: Not Progressing   Problem: Clinical Measurements: Goal: Ability to maintain clinical measurements within normal limits will improve 08/26/2023 1420 by Artemio Bilberry, RN Outcome: Adequate for Discharge 08/26/2023 1420 by Artemio Bilberry, RN Outcome: Not Progressing Goal: Will remain free from infection 08/26/2023 1420 by Artemio Bilberry, RN Outcome: Adequate for Discharge 08/26/2023 1420 by Artemio Bilberry, RN Outcome: Not Progressing Goal: Diagnostic test results will improve 08/26/2023 1420 by Artemio Bilberry, RN Outcome: Adequate for Discharge 08/26/2023 1420 by Artemio Bilberry, RN Outcome: Not Progressing Goal: Respiratory complications will improve 08/26/2023 1420 by Roxene Alviar D, RN Outcome: Adequate for Discharge 08/26/2023 1420 by Artemio Bilberry, RN Outcome: Not Progressing Goal: Cardiovascular complication will be avoided 08/26/2023 1420 by Jquan Egelston D, RN Outcome: Adequate for Discharge 08/26/2023 1420 by Artemio Bilberry, RN Outcome: Not Progressing   Problem: Activity: Goal: Risk for activity intolerance will decrease 08/26/2023 1420 by Artemio Bilberry, RN Outcome: Adequate for Discharge 08/26/2023 1420 by Artemio Bilberry, RN Outcome: Not Progressing   Problem: Nutrition: Goal: Adequate nutrition will be maintained 08/26/2023 1420 by Artemio Bilberry, RN Outcome: Adequate for Discharge 08/26/2023 1420 by  Artemio Bilberry, RN Outcome: Not Progressing   Problem: Coping: Goal: Level of anxiety will decrease 08/26/2023 1420 by Artemio Bilberry, RN Outcome: Adequate for Discharge 08/26/2023 1420 by Artemio Bilberry, RN Outcome: Not Progressing   Problem: Elimination: Goal: Will not experience complications related to bowel motility 08/26/2023 1420 by Artemio Bilberry, RN Outcome: Adequate for Discharge 08/26/2023 1420 by Artemio Bilberry, RN Outcome: Not Progressing Goal: Will not experience complications related to urinary retention 08/26/2023 1420 by Artemio Bilberry, RN Outcome: Adequate for Discharge 08/26/2023 1420 by Artemio Bilberry, RN Outcome: Not Progressing   Problem: Pain Managment: Goal: General experience of comfort will improve and/or be controlled 08/26/2023 1420 by Artemio Bilberry, RN Outcome: Adequate for Discharge 08/26/2023 1420 by Artemio Bilberry, RN Outcome: Not Progressing   Problem: Safety: Goal: Ability to remain free from injury will improve 08/26/2023 1420 by Artemio Bilberry, RN Outcome: Adequate for Discharge 08/26/2023 1420 by Artemio Bilberry, RN Outcome: Not Progressing   Problem: Skin Integrity: Goal: Risk for impaired skin integrity will decrease 08/26/2023 1420 by Artemio Bilberry, RN Outcome: Adequate for Discharge 08/26/2023 1420 by Artemio Bilberry, RN Outcome: Not Progressing   Problem: Education: Goal: Ability to describe self-care measures that may prevent or decrease complications (Diabetes Survival Skills Education) will improve 08/26/2023 1420 by Artemio Bilberry, RN Outcome: Adequate for Discharge 08/26/2023 1420 by Artemio Bilberry, RN Outcome: Not Progressing Goal: Individualized Educational Video(s) 08/26/2023 1420 by Shanterica Biehler D, RN Outcome: Adequate for Discharge 08/26/2023 1420 by Artemio Bilberry, RN Outcome: Not Progressing   Problem: Coping: Goal: Ability to adjust to condition or change in health will improve 08/26/2023  1420 by Artemio Bilberry, RN Outcome: Adequate for Discharge 08/26/2023 1420 by Artemio Bilberry, RN Outcome: Not Progressing   Problem: Fluid Volume: Goal: Ability to maintain a  balanced intake and output will improve 08/26/2023 1420 by Kyrielle Urbanski D, RN Outcome: Adequate for Discharge 08/26/2023 1420 by Artemio Bilberry, RN Outcome: Not Progressing   Problem: Health Behavior/Discharge Planning: Goal: Ability to identify and utilize available resources and services will improve 08/26/2023 1420 by Artemio Bilberry, RN Outcome: Adequate for Discharge 08/26/2023 1420 by Artemio Bilberry, RN Outcome: Not Progressing Goal: Ability to manage health-related needs will improve 08/26/2023 1420 by Artemio Bilberry, RN Outcome: Adequate for Discharge 08/26/2023 1420 by Artemio Bilberry, RN Outcome: Not Progressing   Problem: Metabolic: Goal: Ability to maintain appropriate glucose levels will improve 08/26/2023 1420 by Artemio Bilberry, RN Outcome: Adequate for Discharge 08/26/2023 1420 by Artemio Bilberry, RN Outcome: Not Progressing   Problem: Nutritional: Goal: Maintenance of adequate nutrition will improve 08/26/2023 1420 by Syrenity Klepacki D, RN Outcome: Adequate for Discharge 08/26/2023 1420 by Artemio Bilberry, RN Outcome: Not Progressing Goal: Progress toward achieving an optimal weight will improve 08/26/2023 1420 by Doreena Maulden D, RN Outcome: Adequate for Discharge 08/26/2023 1420 by Artemio Bilberry, RN Outcome: Not Progressing   Problem: Skin Integrity: Goal: Risk for impaired skin integrity will decrease 08/26/2023 1420 by Artemio Bilberry, RN Outcome: Adequate for Discharge 08/26/2023 1420 by Artemio Bilberry, RN Outcome: Not Progressing   Problem: Tissue Perfusion: Goal: Adequacy of tissue perfusion will improve 08/26/2023 1420 by Darienne Belleau D, RN Outcome: Adequate for Discharge 08/26/2023 1420 by Artemio Bilberry, RN Outcome: Not Progressing

## 2023-08-27 ENCOUNTER — Encounter (HOSPITAL_COMMUNITY): Payer: Self-pay

## 2023-08-27 ENCOUNTER — Emergency Department (HOSPITAL_COMMUNITY)

## 2023-08-27 ENCOUNTER — Other Ambulatory Visit: Payer: Self-pay

## 2023-08-27 ENCOUNTER — Emergency Department (HOSPITAL_COMMUNITY)
Admission: EM | Admit: 2023-08-27 | Discharge: 2023-08-28 | Disposition: A | Discharge status: Home / Self Care | Attending: Emergency Medicine | Admitting: Emergency Medicine

## 2023-08-27 DIAGNOSIS — Z96652 Presence of left artificial knee joint: Secondary | ICD-10-CM | POA: Insufficient documentation

## 2023-08-27 DIAGNOSIS — Z7984 Long term (current) use of oral hypoglycemic drugs: Secondary | ICD-10-CM | POA: Diagnosis not present

## 2023-08-27 DIAGNOSIS — M79662 Pain in left lower leg: Secondary | ICD-10-CM | POA: Diagnosis not present

## 2023-08-27 DIAGNOSIS — M25562 Pain in left knee: Secondary | ICD-10-CM | POA: Diagnosis present

## 2023-08-27 DIAGNOSIS — Y92009 Unspecified place in unspecified non-institutional (private) residence as the place of occurrence of the external cause: Secondary | ICD-10-CM | POA: Insufficient documentation

## 2023-08-27 DIAGNOSIS — W1811XA Fall from or off toilet without subsequent striking against object, initial encounter: Secondary | ICD-10-CM | POA: Diagnosis not present

## 2023-08-27 DIAGNOSIS — W19XXXA Unspecified fall, initial encounter: Secondary | ICD-10-CM

## 2023-08-27 LAB — CBC WITH DIFFERENTIAL/PLATELET
Abs Immature Granulocytes: 0.15 10*3/uL — ABNORMAL HIGH (ref 0.00–0.07)
Basophils Absolute: 0.1 10*3/uL (ref 0.0–0.1)
Basophils Relative: 0 %
Eosinophils Absolute: 0.2 10*3/uL (ref 0.0–0.5)
Eosinophils Relative: 1 %
HCT: 32 % — ABNORMAL LOW (ref 36.0–46.0)
Hemoglobin: 9.7 g/dL — ABNORMAL LOW (ref 12.0–15.0)
Immature Granulocytes: 1 %
Lymphocytes Relative: 11 %
Lymphs Abs: 1.8 10*3/uL (ref 0.7–4.0)
MCH: 29 pg (ref 26.0–34.0)
MCHC: 30.3 g/dL (ref 30.0–36.0)
MCV: 95.5 fL (ref 80.0–100.0)
Monocytes Absolute: 1.2 10*3/uL — ABNORMAL HIGH (ref 0.1–1.0)
Monocytes Relative: 7 %
Neutro Abs: 12.5 10*3/uL — ABNORMAL HIGH (ref 1.7–7.7)
Neutrophils Relative %: 80 %
Platelets: 226 10*3/uL (ref 150–400)
RBC: 3.35 MIL/uL — ABNORMAL LOW (ref 3.87–5.11)
RDW: 13.2 % (ref 11.5–15.5)
WBC: 16 10*3/uL — ABNORMAL HIGH (ref 4.0–10.5)
nRBC: 0 % (ref 0.0–0.2)

## 2023-08-27 LAB — BASIC METABOLIC PANEL WITH GFR
Anion gap: 8 (ref 5–15)
BUN: 20 mg/dL (ref 8–23)
CO2: 30 mmol/L (ref 22–32)
Calcium: 8.3 mg/dL — ABNORMAL LOW (ref 8.9–10.3)
Chloride: 95 mmol/L — ABNORMAL LOW (ref 98–111)
Creatinine, Ser: 1.09 mg/dL — ABNORMAL HIGH (ref 0.44–1.00)
GFR, Estimated: 49 mL/min — ABNORMAL LOW (ref >60)
Glucose, Bld: 207 mg/dL — ABNORMAL HIGH (ref 70–99)
Potassium: 3.4 mmol/L — ABNORMAL LOW (ref 3.5–5.1)
Sodium: 133 mmol/L — ABNORMAL LOW (ref 135–145)

## 2023-08-27 MED ORDER — SENNA 8.6 MG PO TABS
2.0000 | ORAL_TABLET | Freq: Every day | ORAL | Status: DC
  Administered 2023-08-27: 17.2 mg via ORAL
  Filled 2023-08-27: qty 2

## 2023-08-27 MED ORDER — NITROGLYCERIN 0.4 MG SL SUBL: 0.4000 mg | SUBLINGUAL_TABLET | SUBLINGUAL | Status: DC | PRN

## 2023-08-27 MED ORDER — ACETAMINOPHEN 500 MG PO TABS: 1000.0000 mg | ORAL_TABLET | Freq: Three times a day (TID) | ORAL | Status: DC | PRN

## 2023-08-27 MED ORDER — LORAZEPAM 0.5 MG PO TABS
0.5000 mg | ORAL_TABLET | Freq: Every day | ORAL | Status: DC
  Administered 2023-08-27: 0.5 mg via ORAL
  Filled 2023-08-27: qty 1

## 2023-08-27 MED ORDER — METFORMIN HCL 500 MG PO TABS
500.0000 mg | ORAL_TABLET | Freq: Every day | ORAL | Status: DC
  Administered 2023-08-28: 500 mg via ORAL
  Filled 2023-08-27: qty 1

## 2023-08-27 MED ORDER — FLUTICASONE FUROATE-VILANTEROL 200-25 MCG/ACT IN AEPB
1.0000 | INHALATION_SPRAY | Freq: Every day | RESPIRATORY_TRACT | Status: DC
  Filled 2023-08-27: qty 28

## 2023-08-27 MED ORDER — FERROUS SULFATE 325 (65 FE) MG PO TABS
325.0000 mg | ORAL_TABLET | Freq: Every day | ORAL | Status: DC
  Administered 2023-08-28: 325 mg via ORAL
  Filled 2023-08-27: qty 1

## 2023-08-27 MED ORDER — AMIODARONE HCL 200 MG PO TABS
200.0000 mg | ORAL_TABLET | Freq: Every day | ORAL | Status: DC
  Filled 2023-08-27: qty 1

## 2023-08-27 MED ORDER — LINAGLIPTIN 5 MG PO TABS
5.0000 mg | ORAL_TABLET | Freq: Every day | ORAL | Status: DC
  Filled 2023-08-27: qty 1

## 2023-08-27 MED ORDER — HALOPERIDOL 1 MG PO TABS
1.0000 mg | ORAL_TABLET | Freq: Every day | ORAL | Status: DC
  Administered 2023-08-27: 1 mg via ORAL
  Filled 2023-08-27: qty 1

## 2023-08-27 MED ORDER — LORATADINE 10 MG PO TABS
10.0000 mg | ORAL_TABLET | Freq: Every day | ORAL | Status: DC
  Administered 2023-08-28: 10 mg via ORAL
  Filled 2023-08-27: qty 1

## 2023-08-27 MED ORDER — PROCHLORPERAZINE MALEATE 10 MG PO TABS: 10.0000 mg | ORAL_TABLET | Freq: Four times a day (QID) | ORAL | Status: DC | PRN

## 2023-08-27 MED ORDER — PANTOPRAZOLE SODIUM 40 MG PO TBEC
40.0000 mg | DELAYED_RELEASE_TABLET | Freq: Two times a day (BID) | ORAL | Status: DC
  Administered 2023-08-27 – 2023-08-28 (×2): 40 mg via ORAL
  Filled 2023-08-27 (×2): qty 1

## 2023-08-27 MED ORDER — ROPINIROLE HCL 0.25 MG PO TABS
0.2500 mg | ORAL_TABLET | Freq: Every day | ORAL | Status: DC
  Administered 2023-08-27: 0.25 mg via ORAL
  Filled 2023-08-27: qty 1

## 2023-08-27 MED ORDER — CARBOXYMETHYLCELLULOSE SODIUM 0.5 % OP SOLN: 1.0000 [drp] | Freq: Every day | OPHTHALMIC | Status: DC | PRN

## 2023-08-27 MED ORDER — GLYCOPYRROLATE 1 MG PO TABS
1.0000 mg | ORAL_TABLET | Freq: Three times a day (TID) | ORAL | Status: DC
  Administered 2023-08-27: 1 mg via ORAL
  Filled 2023-08-27 (×3): qty 1

## 2023-08-27 MED ORDER — CRANBERRY 250 MG PO TABS: ORAL_TABLET | Freq: Every day | ORAL | Status: DC

## 2023-08-27 MED ORDER — GABAPENTIN 100 MG PO CAPS
100.0000 mg | ORAL_CAPSULE | Freq: Three times a day (TID) | ORAL | Status: DC
  Administered 2023-08-27 – 2023-08-28 (×2): 100 mg via ORAL
  Filled 2023-08-27 (×2): qty 1

## 2023-08-27 MED ORDER — IPRATROPIUM-ALBUTEROL 0.5-2.5 (3) MG/3ML IN SOLN
3.0000 mL | Freq: Three times a day (TID) | RESPIRATORY_TRACT | Status: DC
  Administered 2023-08-27 – 2023-08-28 (×2): 3 mL via RESPIRATORY_TRACT
  Filled 2023-08-27: qty 3

## 2023-08-27 MED ORDER — OXYCODONE HCL 5 MG PO TABS
5.0000 mg | ORAL_TABLET | ORAL | Status: DC | PRN
  Administered 2023-08-28: 5 mg via ORAL
  Filled 2023-08-27: qty 1

## 2023-08-27 MED ORDER — CITALOPRAM HYDROBROMIDE 10 MG PO TABS
20.0000 mg | ORAL_TABLET | Freq: Every day | ORAL | Status: DC
  Administered 2023-08-28: 20 mg via ORAL
  Filled 2023-08-27 (×2): qty 2

## 2023-08-27 MED ORDER — MELATONIN 3 MG PO TABS
6.0000 mg | ORAL_TABLET | Freq: Every day | ORAL | Status: DC
  Administered 2023-08-27: 6 mg via ORAL
  Filled 2023-08-27: qty 2

## 2023-08-27 MED ORDER — OXYCODONE HCL 5 MG PO TABS
5.0000 mg | ORAL_TABLET | Freq: Once | ORAL | Status: AC
  Administered 2023-08-27: 5 mg via ORAL
  Filled 2023-08-27: qty 1

## 2023-08-27 MED ORDER — ALBUTEROL SULFATE HFA 108 (90 BASE) MCG/ACT IN AERS: 2.0000 | INHALATION_SPRAY | Freq: Four times a day (QID) | RESPIRATORY_TRACT | Status: DC | PRN

## 2023-08-27 MED ORDER — POLYVINYL ALCOHOL 1.4 % OP SOLN: 1.0000 [drp] | Freq: Every day | OPHTHALMIC | Status: DC | PRN

## 2023-08-27 MED ORDER — DICLOFENAC SODIUM 1 % EX GEL
1.0000 | Freq: Three times a day (TID) | CUTANEOUS | Status: DC
  Filled 2023-08-27: qty 100

## 2023-08-27 MED ORDER — FUROSEMIDE 40 MG PO TABS: 40.0000 mg | ORAL_TABLET | Freq: Every day | ORAL | Status: DC

## 2023-08-27 MED ORDER — POTASSIUM CHLORIDE CRYS ER 10 MEQ PO TBCR
10.0000 meq | EXTENDED_RELEASE_TABLET | Freq: Every day | ORAL | Status: DC
  Administered 2023-08-28: 10 meq via ORAL
  Filled 2023-08-27: qty 1

## 2023-08-27 NOTE — ED Notes (Signed)
 Patient back from  X-ray

## 2023-08-27 NOTE — TOC Initial Note (Signed)
 Transition of Care [facility]) - Initial/Assessment Note    Patient Details  Name: Danielle [name] MRN: [id number] Date of Birth: January 05, 1934  [facility] CM/SW Contact:    Valley Gavia, LCSWA Phone Number: 08/27/2023, 9:37 PM  Clinical Narrative:                  CSW spoke with pt's daughter [name] via phone, she confirms pt has in home services with [facility]. Pt's daughter states she is unable to care for pt in the home as pt is very weak and she ([name]) is unable to lift pt due to her size. CSW inquired on whether or not pt would be agreeable to STR, she states pt was in rehab in April. CSW inquired on LTC medicaid, she states pt would more than likely not qualify as pt has property in her name. TOC will continue to follow.        Patient Goals and CMS Choice            Expected Discharge Plan and Services                                              Prior Living Arrangements/Services                       Activities of Daily Living      Permission Sought/Granted                  Emotional Assessment              Admission diagnosis:  leg pain Patient Active Problem List   Diagnosis Date Noted   Acute on chronic respiratory failure with hypoxia (HCC) 08/24/2023   Anemia 08/24/2023   Parotid mass 08/24/2023   Acute cystitis without hematuria 08/24/2023   Hospice care patient 08/24/2023   DNR (do not resuscitate)/DNI(Do Not Intubate) 08/24/2023   Failure to thrive in adult 07/13/2023   Generalized weakness 07/12/2023   (HFpEF) heart failure with preserved ejection fraction (HCC) 06/16/2023   Type 2 diabetes mellitus (HCC) 06/13/2023   Peripheral polyneuropathy 09/10/2022   Claudication (HCC) 09/10/2022   Subclinical hypothyroidism 03/11/2022   Paroxysmal atrial fibrillation (HCC) 02/27/2022   Coronary artery disease    Osteoporosis    Insomnia    Hypertension    Hyperlipidemia    History of hiatal  hernia    Headache    GERD (gastroesophageal reflux disease)    Arthritis    Callus 05/30/2020   Flat feet, bilateral 05/30/2020   Hammer toe of left foot 05/30/2020   OSA (obstructive sleep apnea) 12/16/2019   Internal hemorrhoids 11/26/2018   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/26/2018   DOE (dyspnea on exertion) 09/07/2018   Class 3 obesity 09/07/2018   Iron deficiency anemia 08/12/2018   Senile purpura (HCC) 08/12/2018   Strain of right shoulder 08/12/2018   Dysphagia 08/12/2018   Chronic pain of both knees 05/14/2018   Stasis edema of left lower extremity 04/13/2018   Vitamin B12 deficiency 04/13/2018   Restless legs syndrome 03/04/2018   At high risk for falls 11/11/2017   Gastroesophageal reflux disease without esophagitis 11/11/2017   Stage 3a chronic kidney disease (HCC) 05/07/2017   Chronic respiratory failure with hypoxia (HCC) 05/05/2017   Vitamin D  deficiency 02/27/2016   Chronic diastolic congestive heart failure (  HCC) 11/11/2015   Depression 11/09/2015   COPD (chronic obstructive pulmonary disease) (HCC) 11/09/2015   Chronic insomnia 10/24/2015   Type 2 diabetes mellitus with hyperlipidemia (HCC) 08/14/2015   Primary osteoarthritis involving multiple joints 08/14/2015   Malaise and fatigue 08/14/2015   Degenerative lumbar disc 08/14/2015   Abnormal gait 08/14/2015   Mesenteric mass 02/19/2015   Coronary artery disease involving native coronary artery of native heart without angina pectoris 11/14/2014   Mixed hyperlipidemia 11/14/2014   Current mild episode of major depressive disorder (HCC) 11/14/2014   PCP:  Abbe Hoard., MD Pharmacy:   [facility] [address] [place], [place] - [address] [address] [place] [address] Phone: [phone] Fax: [phone]     Social Drivers of Health (SDOH) Social History: SDOH Screenings   Food Insecurity: No Food Insecurity (08/24/2023)  Housing: Low Risk  (08/24/2023)  Transportation Needs: No  Transportation Needs (08/24/2023)  Utilities: Not At Risk (08/24/2023)  Social Connections: Moderately Isolated (08/24/2023)  Tobacco Use: Medium Risk (08/27/2023)   SDOH Interventions:     Readmission Risk Interventions    08/26/2023    3:39 PM 07/14/2023   12:45 PM 06/15/2023   10:32 AM  Readmission Risk Prevention Plan  Transportation Screening Complete Complete Complete  PCP or Specialist Appt within 3-5 Days Complete  Complete  HRI or Home Care Consult Complete  Complete  Social Work Consult for Recovery Care Planning/Counseling Complete  Complete  Palliative Care Screening Not Applicable  Not Applicable  Medication Review Oceanographer) Complete Complete Complete  PCP or Specialist appointment within 3-5 days of discharge  Complete   HRI or Home Care Consult  Complete   SW Recovery Care/Counseling Consult  Complete   Palliative Care Screening  Complete   Skilled Nursing Facility  Patient Refused

## 2023-08-27 NOTE — ED Triage Notes (Signed)
 Patient BIB [facility] from home. Patient went to use her bedside commode and fell. No LOC. Did not hit her head. Is on blood thinners. When she fell her left leg was caught behind her body. Has left leg pain.

## 2023-08-27 NOTE — ED Provider Notes (Signed)
 [place] EMERGENCY DEPARTMENT AT [facility] Provider Note   CSN: [id number] Arrival date & time: 08/27/23  1639     History  Chief Complaint  Patient presents with   Leg Pain    Danielle [name] is a 89 y.o. female.  89 year old female with prior medical history as detailed below presents for evaluation.  Patient arrives from home with EMS transport.  Patient was attempting to use her bedside commode.  She fell.  She did not strike her head.  She did not suffer LOC.  She reports increased pain to the left lateral knee.  She reports that when she fell, her left leg was folded up underneath her.  She complains of pain to the left knee and left proximal tibia.  She is status post left total knee replacement per her report.  She takes oxycodone  regularly.  She is requesting a dose of oxycodone  now.  The history is provided by the patient.       Home Medications Prior to Admission medications   Medication Sig Start Date End Date Taking? Authorizing Provider  acetaminophen  (TYLENOL ) 500 MG tablet Take 2 tablets (1,000 mg total) by mouth every 8 (eight) hours as needed for mild pain (pain score 1-3), headache or fever. 08/26/23   Hongalgi, Thomasene Flemings, MD  albuterol  (PROVENTIL  HFA;VENTOLIN  HFA) 108 (90 BASE) MCG/ACT inhaler Inhale 2 puffs into the lungs every 6 (six) hours as needed for wheezing or shortness of breath.     [provider]  amiodarone  (PACERONE ) 200 MG tablet Take 1 tablet (200 mg total) by mouth daily. 03/05/23   Krasowski, Robert J, MD  budesonide -formoterol  (SYMBICORT ) 160-4.5 MCG/ACT inhaler Inhale 2 puffs into the lungs 2 (two) times daily.    [provider]  carboxymethylcellulose (REFRESH PLUS) 0.5 % SOLN Place 1 drop into both eyes daily as needed (dry eyes).    [provider]  citalopram  (CELEXA ) 20 MG tablet Take 20 mg by mouth daily. 10/12/16   [provider]  CRANBERRY PO Take 1 tablet by mouth daily.     [provider]  diclofenac  Sodium (VOLTAREN ) 1 % GEL APPLY 2 GRAMS TOPICALLY TO AFFECTED AREA THREE TIMES DAILY Patient taking differently: Apply 1 Application topically in the morning and at bedtime. 08/08/19   Timothy Ford, MD  ferrous sulfate  325 (65 FE) MG EC tablet Take 325 mg by mouth daily with breakfast.    [provider]  furosemide  (LASIX ) 20 MG tablet Take 2 tablets (40 mg total) by mouth daily. Patient taking differently: Take 20-40 mg by mouth See admin instructions. Take 40 mg by mouth in the morning and 20 mg by mouth in the afternoon. 07/16/23   Audria Leather, MD  gabapentin  (NEURONTIN ) 100 MG capsule Take 1 capsule (100 mg total) by mouth 3 (three) times daily. Patient taking differently: Take 100-200 mg by mouth See admin instructions. Take 100 mg by mouth in the morning and 200 mg by mouth at bedtime. 06/19/23   Lavada Porteous, DO  glycopyrrolate  (ROBINUL ) 1 MG tablet Take 1 tablet (1 mg total) by mouth 3 (three) times daily. 08/26/23   Hongalgi, Anand D, MD  haloperidol  (HALDOL ) 1 MG tablet Take 1 mg by mouth at bedtime. 08/06/23   [provider]  ipratropium-albuterol  (DUONEB) 0.5-2.5 (3) MG/3ML SOLN Take 3 mLs by nebulization in the morning, at noon, and at bedtime. 08/13/23   [provider]  loratadine  (CLARITIN ) 10 MG tablet Take 10 mg by  mouth daily.    [provider]  LORazepam (ATIVAN) 0.5 MG tablet Take 0.5 mg by mouth at bedtime. May take an additional 0.5 mg by mouth for anxiety    [provider]  melatonin 3 MG TABS tablet Take 6 mg by mouth at bedtime. 08/11/23   [provider]  metFORMIN  (GLUCOPHAGE ) 500 MG tablet Take 500 mg by mouth daily with breakfast.    [provider]  nitroGLYCERIN  (NITROSTAT ) 0.4 MG SL tablet Place 1 tablet (0.4 mg total) under the tongue every 5 (five) minutes as needed for chest pain. 08/22/20 08/21/27  Krasowski, Robert J, MD  oxyCODONE  (OXY IR/ROXICODONE ) 5 MG  immediate release tablet Take 1-2 tablets (5-10 mg total) by mouth every 4 (four) hours as needed for up to 5 days for moderate pain (pain score 4-6) or severe pain (pain score 7-10). 08/25/23 08/30/23  Unk Garb, DO  pantoprazole  (PROTONIX ) 40 MG tablet Take 40 mg by mouth 2 (two) times daily. 11/14/16   [provider]  potassium chloride  (MICRO-K ) 10 MEQ CR capsule Take 10 mEq by mouth daily. 11/20/14   [provider]  Probiotic Product (CULTRELLE KIDS IMMUNE DEFENSE PO) Take 1 Dose by mouth daily.    [provider]  prochlorperazine (COMPAZINE) 10 MG tablet Take 10 mg by mouth every 6 (six) hours as needed for nausea. 08/17/23   [provider]  Pseudoephedrine-Guaifenesin  (MUCINEX  D MAX STRENGTH) [phone] MG TB12 Take 1,200 mg by mouth daily.    [provider]  rOPINIRole  (REQUIP ) 0.25 MG tablet Take 0.25 mg by mouth at bedtime.    [provider]  senna (SENOKOT) 8.6 MG TABS tablet Take 2 tablets by mouth at bedtime.    [provider]  sitaGLIPtin (JANUVIA) 100 MG tablet Take 100 mg by mouth daily. 03/11/22   [provider]      Allergies    Codeine, Penicillins, Xarelto  [rivaroxaban ], Levofloxacin, and Tape    Review of Systems   Review of Systems  All other systems reviewed and are negative.   Physical Exam Updated Vital Signs BP (!) 134/48 (BP Location: Right Arm)   Pulse 65   Temp 98.3 F (36.8 C) (Oral)   Resp 16   Ht 5\' 2"  (1.575 m)   Wt 105 kg   SpO2 99%   BMI 42.34 kg/m  Physical Exam Vitals and nursing note reviewed.  Constitutional:      General: She is not in acute distress.    Appearance: Normal appearance. She is well-developed.  HENT:     Head: Normocephalic and atraumatic.  Eyes:     Conjunctiva/sclera: Conjunctivae normal.     Pupils: Pupils are equal, round, and reactive to light.  Cardiovascular:     Rate and Rhythm: Normal rate and regular rhythm.     Heart sounds: Normal heart  sounds.  Pulmonary:     Effort: Pulmonary effort is normal. No respiratory distress.     Breath sounds: Normal breath sounds.  Abdominal:     General: There is no distension.     Palpations: Abdomen is soft.     Tenderness: There is no abdominal tenderness.  Musculoskeletal:        General: No deformity. Normal range of motion.     Cervical back: Normal range of motion and neck supple.  Skin:    General: Skin is warm and dry.  Neurological:     General: No focal deficit present.     Mental  Status: She is alert and oriented to person, place, and time.     ED Results / Procedures / Treatments   Labs (all labs ordered are listed, but only abnormal results are displayed) Labs Reviewed  BASIC METABOLIC PANEL WITH GFR  CBC WITH DIFFERENTIAL/PLATELET    EKG None  Radiology No results found.  Procedures Procedures    Medications Ordered in ED Medications  oxyCODONE  (Oxy IR/ROXICODONE ) immediate release tablet 5 mg (has no administration in time range)    ED Course/ Medical Decision Making/ A&P                                 Medical Decision Making Amount and/or Complexity of Data Reviewed Labs: ordered. Radiology: ordered.  Risk OTC drugs. Prescription drug management.    Medical Screen Complete  This patient presented to the ED with complaint of fall.  This complaint involves an extensive number of treatment options. The initial differential diagnosis includes, but is not limited to, trauma from fall  This presentation is: Acute, Chronic, Self-Limited, Previously Undiagnosed, Uncertain Prognosis, Complicated, Systemic Symptoms, and Threat to Life/Bodily Function  Patient presents from home after fall from bedside commode.  Workup does not reveal significant traumatic injury.  However, patient is not safe for discharge back to home.  Patient was discharged yesterday to home with home hospice.  [facility] did not feel that she was qualified for  hospice bed.  Family does not feel comfortable with discharge to home.  Patient will remain here in the ED overnight pending TOC/PT evaluation tomorrow.  Patient and patient's family comfortable with plan for reevaluation by social work/physical therapy for possible safer discharge plan.  Additional history obtained:  Additional history obtained from [facility] External records from outside sources obtained and reviewed including prior ED visits and prior Inpatient records.    Problem List / ED Course:  Fall   Disposition:  After consideration of the diagnostic results and the patients response to treatment, I feel that the patent would benefit from completion of ED evaluation.          Final Clinical Impression(s) / ED Diagnoses Final diagnoses:  Fall, initial encounter    Rx / DC Orders ED Discharge Orders     None         Burnette Carte, MD 08/27/23 2055

## 2023-08-28 LAB — CULTURE, BLOOD (ROUTINE X 2)
Culture: NO GROWTH
Culture: NO GROWTH

## 2023-08-28 MED ORDER — FUROSEMIDE 40 MG PO TABS
40.0000 mg | ORAL_TABLET | Freq: Every day | ORAL | Status: DC
  Administered 2023-08-28: 40 mg via ORAL
  Filled 2023-08-28: qty 1

## 2023-08-28 NOTE — ED Provider Notes (Signed)
 Patient boarding pending SW and hospice assistance for possible hospice placement (family reporting unable to provide home assistance for home hospice).  Pt recently hospitalized and treated for PNA.   Arvilla Birmingham, MD 08/28/23 0730

## 2023-08-28 NOTE — Progress Notes (Signed)
 Pt with recent discharge from inpatient admission on 5/28. Pt had a fall from her BSC. Per Hospice Liaison, pt was recently deemed to be bed bound. Due to this, pt does not qualify for SNF. Unfortunately, pt does not qualify for Medicaid (long term payor source) due to having property in her name. CSW explained the above to pt's daughter, Danielle [name], who verbalized understanding and stated she was aware. Danielle [name] expressed that she needed someone to teach her how to change her mother since she is a larger woman. CSW informed that per the liaison, the nurse and SW is scheduled to visit today to arrange additional support (aide visits). Danielle [name] verbalized understanding.   Pt will transport home via [facility]. Care team made aware via secure chat.

## 2023-08-28 NOTE — ED Provider Notes (Addendum)
 Patient cleared to return home, TOC discussed with daughter who is in agreement, and hospice confirmed home nurse can visit today.  [facility] to be arranged for transport   Patient bedbound and not eligible for SNF     Arvilla Birmingham, MD 08/28/23 [phone]

## 2023-08-28 NOTE — Progress Notes (Signed)
   This pt is active with [facility]-  She was just d/c home and daughter reports that she was using [facility] and fell. When she called to report this EMS was already in home and daughter wanted her taken to the ED for xray to make sure nothing was broken. The pt is weak and this has happen fairly quickly over past week due to hospitalization and progression of her disease. The daughter has expressed concern with care giving for her if bed bound and our SW were scheduled to go our and see them at home today to see how they could possibly assist. She has only been home 1 day from last hospitalization.   We will continue to follow and assist with d/c plan if  needed.   Lyla Samuels RN [phone]

## 2023-08-28 NOTE — ED Notes (Signed)
 Dr Morris Arch into see pt new orders obtained. Room air saturations 89% to 92% increasing to 93% on 2L n/c oxygen .

## 2023-08-28 NOTE — ED Notes (Addendum)
 PO lasix  40 mg tab given at 02:27 100 ml amber urine out at this time respirations have improved but moist non-productive cough remains,

## 2023-08-31 ENCOUNTER — Telehealth: Payer: Self-pay | Admitting: Urology

## 2023-08-31 NOTE — Telephone Encounter (Signed)
 Pt daughter [name] wanted to let us  know that her mom has been put into hospice care and they are giving her 2 to 3 days to live.

## 2023-09-08 ENCOUNTER — Inpatient Hospital Stay: Payer: PPO | Admitting: Hematology and Oncology

## 2023-09-29 DEATH — deceased

## 2023-10-14 ENCOUNTER — Ambulatory Visit: Admitting: Gastroenterology

## 2023-11-12 ENCOUNTER — Ambulatory Visit: Payer: PPO | Admitting: Urology
